# Patient Record
Sex: Male | Born: 1970 | Race: Black or African American | Hispanic: No | Marital: Married | Smoking: Never smoker
Health system: Southern US, Community
[De-identification: ages and names within clinical notes are randomized; demographics above are authoritative.]

## PROBLEM LIST (undated history)

## (undated) DIAGNOSIS — K317 Polyp of stomach and duodenum: Secondary | ICD-10-CM

## (undated) DIAGNOSIS — R002 Palpitations: Secondary | ICD-10-CM

## (undated) DIAGNOSIS — F419 Anxiety disorder, unspecified: Secondary | ICD-10-CM

## (undated) DIAGNOSIS — M1990 Unspecified osteoarthritis, unspecified site: Secondary | ICD-10-CM

## (undated) DIAGNOSIS — K5900 Constipation, unspecified: Secondary | ICD-10-CM

## (undated) DIAGNOSIS — N529 Male erectile dysfunction, unspecified: Secondary | ICD-10-CM

## (undated) DIAGNOSIS — F32A Depression, unspecified: Secondary | ICD-10-CM

## (undated) DIAGNOSIS — I509 Heart failure, unspecified: Secondary | ICD-10-CM

## (undated) DIAGNOSIS — K589 Irritable bowel syndrome without diarrhea: Secondary | ICD-10-CM

## (undated) DIAGNOSIS — G4730 Sleep apnea, unspecified: Secondary | ICD-10-CM

## (undated) DIAGNOSIS — Z86718 Personal history of other venous thrombosis and embolism: Secondary | ICD-10-CM

## (undated) DIAGNOSIS — M549 Dorsalgia, unspecified: Secondary | ICD-10-CM

## (undated) DIAGNOSIS — G4733 Obstructive sleep apnea (adult) (pediatric): Secondary | ICD-10-CM

## (undated) DIAGNOSIS — E739 Lactose intolerance, unspecified: Secondary | ICD-10-CM

## (undated) DIAGNOSIS — T7840XA Allergy, unspecified, initial encounter: Secondary | ICD-10-CM

## (undated) DIAGNOSIS — Z86010 Personal history of colonic polyps: Secondary | ICD-10-CM

## (undated) DIAGNOSIS — K2100 Gastro-esophageal reflux disease with esophagitis, without bleeding: Secondary | ICD-10-CM

## (undated) DIAGNOSIS — I10 Essential (primary) hypertension: Secondary | ICD-10-CM

## (undated) DIAGNOSIS — Z860101 Personal history of adenomatous and serrated colon polyps: Secondary | ICD-10-CM

## (undated) DIAGNOSIS — M2550 Pain in unspecified joint: Secondary | ICD-10-CM

## (undated) HISTORY — DX: Allergy, unspecified, initial encounter: T78.40XA

## (undated) HISTORY — DX: Irritable bowel syndrome, unspecified: K58.9

## (undated) HISTORY — DX: Pain in unspecified joint: M25.50

## (undated) HISTORY — DX: Personal history of other venous thrombosis and embolism: Z86.718

## (undated) HISTORY — DX: Depression, unspecified: F32.A

## (undated) HISTORY — DX: Anxiety disorder, unspecified: F41.9

## (undated) HISTORY — DX: Unspecified osteoarthritis, unspecified site: M19.90

## (undated) HISTORY — DX: Sleep apnea, unspecified: G47.30

## (undated) HISTORY — DX: Personal history of colonic polyps: Z86.010

## (undated) HISTORY — DX: Polyp of stomach and duodenum: K31.7

## (undated) HISTORY — DX: Male erectile dysfunction, unspecified: N52.9

## (undated) HISTORY — DX: Personal history of adenomatous and serrated colon polyps: Z86.0101

## (undated) HISTORY — DX: Lactose intolerance, unspecified: E73.9

## (undated) HISTORY — DX: Gastro-esophageal reflux disease with esophagitis, without bleeding: K21.00

## (undated) HISTORY — DX: Dorsalgia, unspecified: M54.9

## (undated) HISTORY — DX: Constipation, unspecified: K59.00

## (undated) HISTORY — DX: Palpitations: R00.2

## (undated) HISTORY — PX: HAND SURGERY: SHX662

## (undated) HISTORY — DX: Obstructive sleep apnea (adult) (pediatric): G47.33

---

## 2004-09-05 ENCOUNTER — Encounter: Admission: RE | Admit: 2004-09-05 | Discharge: 2004-09-05 | Payer: Self-pay | Admitting: Family Medicine

## 2004-09-05 IMAGING — CR DG HAND COMPLETE 3+V*R*
3 series · 3 of 3 positions shown · non-contrast
Comparison: none

CLINICAL DATA: Fell on hand, with pain and swelling.
RIGHT HAND ? 3 VIEW:
Three views of the right hand were obtained.   There is comminuted fracture of the mid and distal aspect of the right fifth metacarpal with adjacent soft tissue swelling.  Very slight dorsal angulation is present at the fracture site.  No other acute abnormality is seen.  The carpal bones are in normal position.  There may be fusion of the lunate and triquetrum.  Radiocarpal joint space is normal.

[view not recorded (1 of 3)]
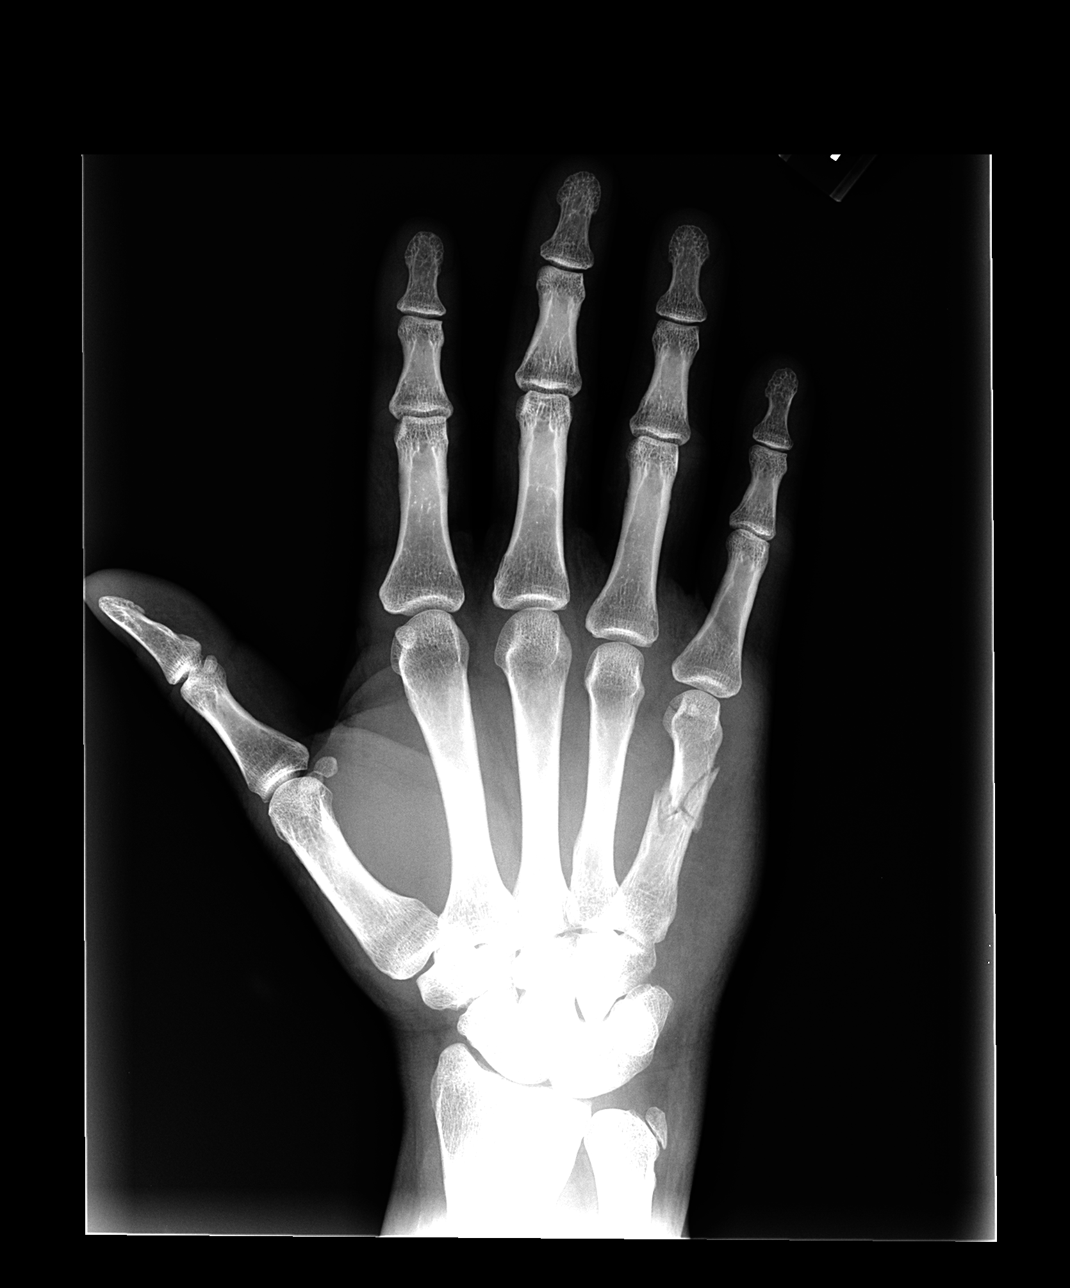

[view not recorded (2 of 3)]
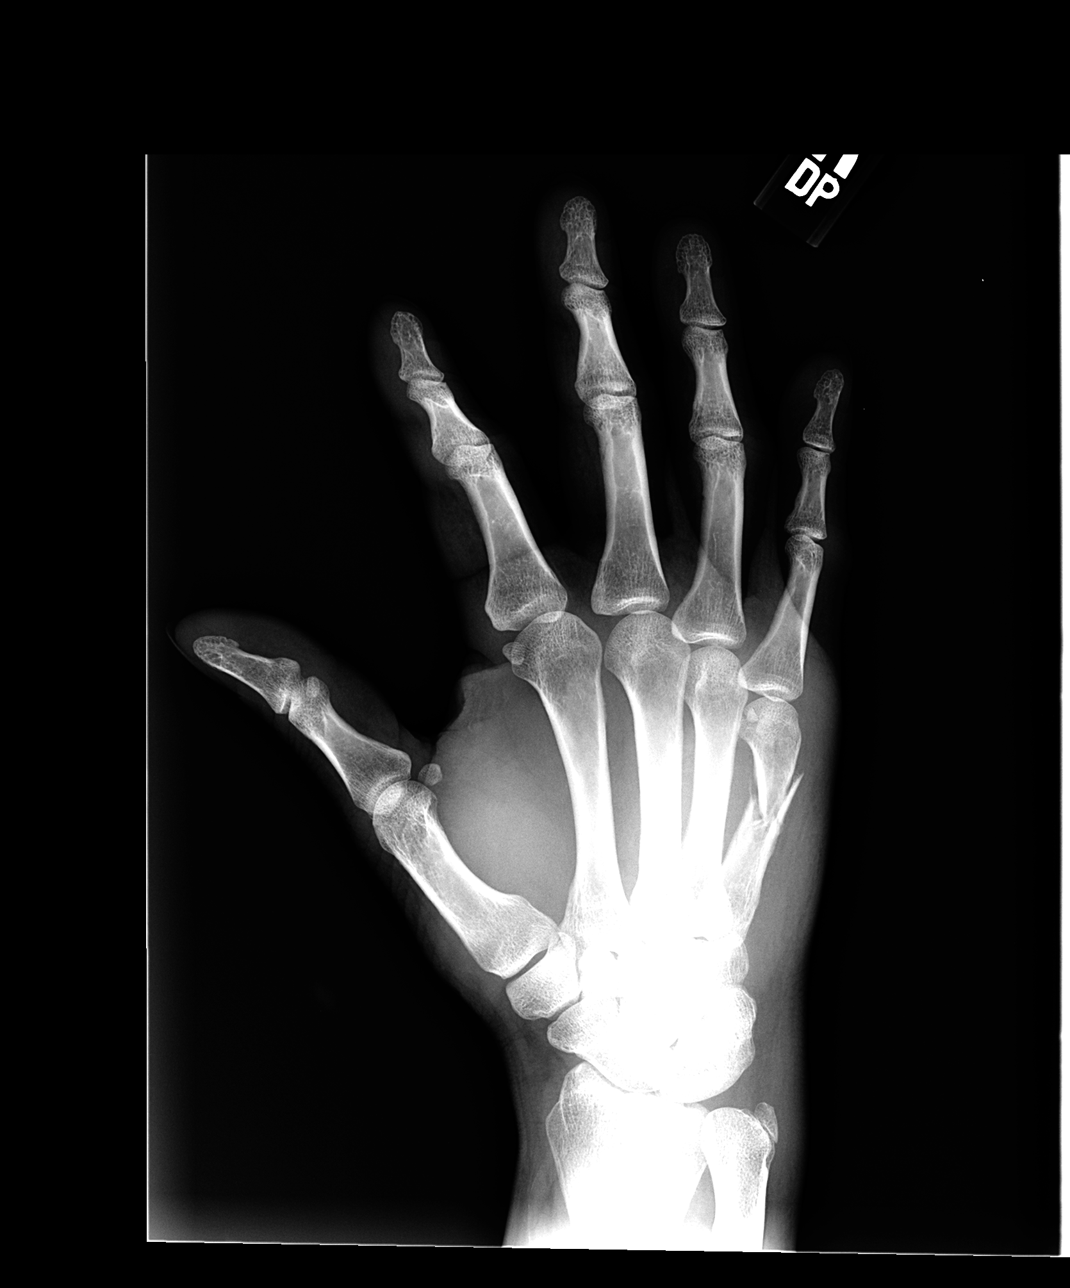

[view not recorded (3 of 3)]
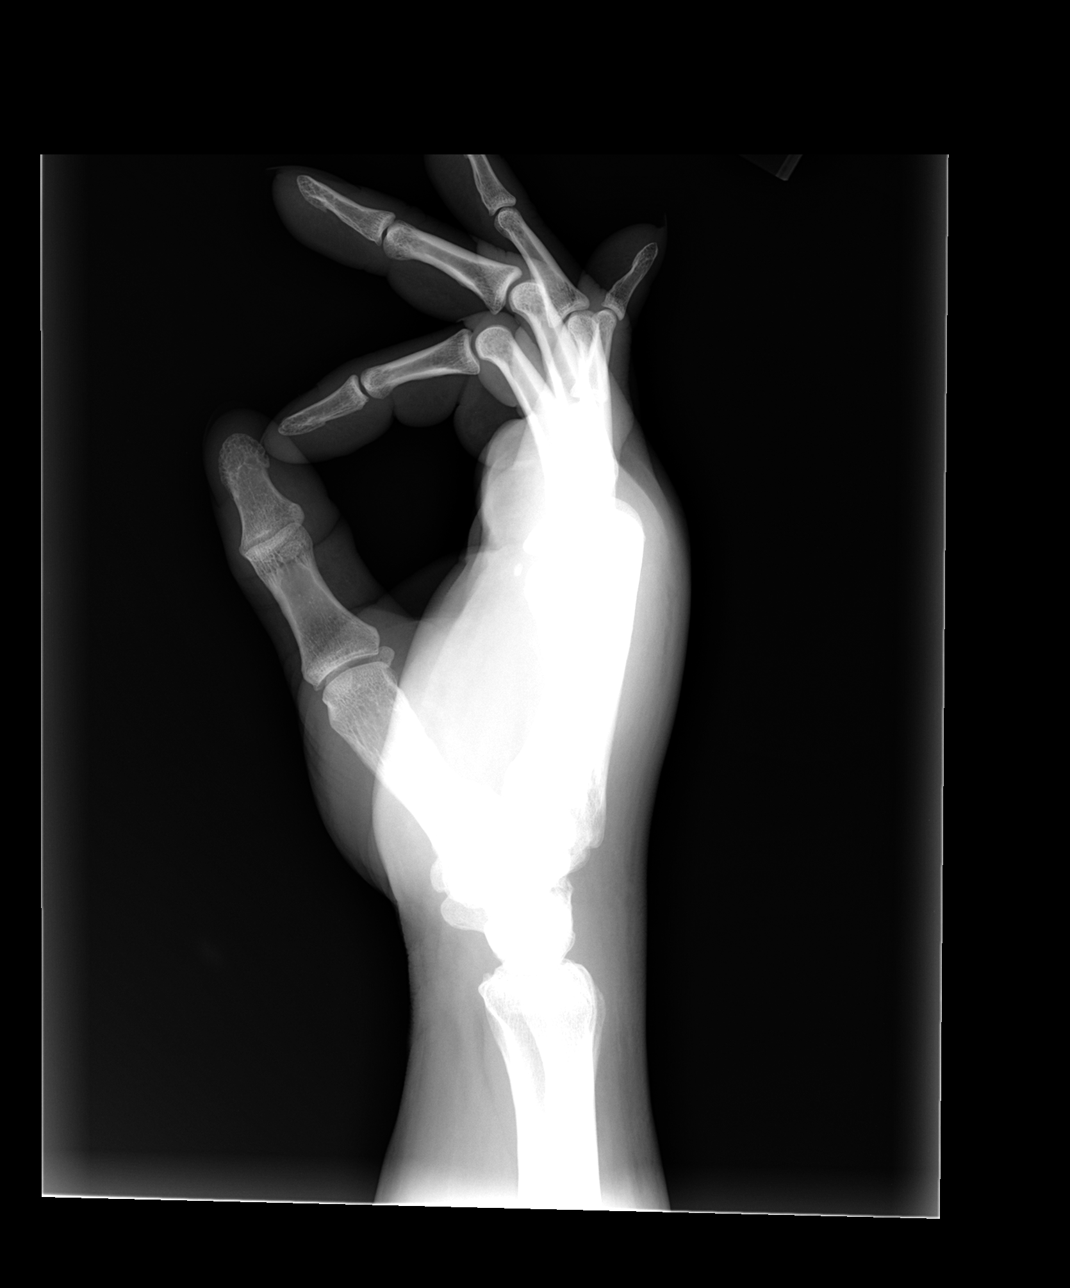

[3 of 3 positions shown; findings below may reference images not displayed]

IMPRESSION: Comminuted and slightly angulated fracture of the mid-distal right fifth metacarpal.

## 2005-08-15 ENCOUNTER — Ambulatory Visit: Payer: Self-pay | Admitting: Family Medicine

## 2005-11-22 ENCOUNTER — Ambulatory Visit: Payer: Self-pay | Admitting: Family Medicine

## 2005-11-22 LAB — CONVERTED CEMR LAB
BUN: 12 mg/dL (ref 6–23)
CO2: 27 meq/L (ref 19–32)
Calcium: 9.6 mg/dL (ref 8.4–10.5)
Chloride: 102 meq/L (ref 96–112)
Chol/HDL Ratio, serum: 4
Cholesterol: 188 mg/dL (ref 0–200)
Creatinine, Ser: 1.2 mg/dL (ref 0.4–1.5)
GFR calc non Af Amer: 72 mL/min
Glomerular Filtration Rate, Af Am: 88 mL/min/{1.73_m2}
Glucose, Bld: 90 mg/dL (ref 70–99)
HDL: 46.6 mg/dL (ref >39.0)
LDL Cholesterol: 128 mg/dL — ABNORMAL HIGH (ref 0–99)
Potassium: 4 meq/L (ref 3.5–5.1)
Sodium: 138 meq/L (ref 135–145)
Triglyceride fasting, serum: 69 mg/dL (ref 0–149)
VLDL: 14 mg/dL (ref 0–40)

## 2006-03-14 DIAGNOSIS — J309 Allergic rhinitis, unspecified: Secondary | ICD-10-CM | POA: Insufficient documentation

## 2006-03-14 DIAGNOSIS — I10 Essential (primary) hypertension: Secondary | ICD-10-CM | POA: Insufficient documentation

## 2006-03-14 DIAGNOSIS — M109 Gout, unspecified: Secondary | ICD-10-CM | POA: Insufficient documentation

## 2006-04-04 ENCOUNTER — Ambulatory Visit: Payer: Self-pay | Admitting: Family Medicine

## 2006-10-05 ENCOUNTER — Ambulatory Visit: Payer: Self-pay | Admitting: Family Medicine

## 2006-10-05 ENCOUNTER — Telehealth (INDEPENDENT_AMBULATORY_CARE_PROVIDER_SITE_OTHER): Payer: Self-pay | Admitting: *Deleted

## 2006-10-11 ENCOUNTER — Encounter (INDEPENDENT_AMBULATORY_CARE_PROVIDER_SITE_OTHER): Payer: Self-pay | Admitting: Family Medicine

## 2006-10-11 LAB — CONVERTED CEMR LAB
BUN: 12 mg/dL (ref 6–23)
CO2: 27 meq/L (ref 19–32)
Calcium: 9.5 mg/dL (ref 8.4–10.5)
Chloride: 103 meq/L (ref 96–112)
Creatinine, Ser: 1.1 mg/dL (ref 0.4–1.5)
GFR calc Af Amer: 97 mL/min
GFR calc non Af Amer: 80 mL/min
Glucose, Bld: 144 mg/dL — ABNORMAL HIGH (ref 70–99)
Potassium: 4 meq/L (ref 3.5–5.1)
Sodium: 138 meq/L (ref 135–145)

## 2007-02-12 ENCOUNTER — Telehealth (INDEPENDENT_AMBULATORY_CARE_PROVIDER_SITE_OTHER): Payer: Self-pay | Admitting: *Deleted

## 2007-07-22 ENCOUNTER — Telehealth (INDEPENDENT_AMBULATORY_CARE_PROVIDER_SITE_OTHER): Payer: Self-pay | Admitting: *Deleted

## 2007-07-29 ENCOUNTER — Telehealth (INDEPENDENT_AMBULATORY_CARE_PROVIDER_SITE_OTHER): Payer: Self-pay | Admitting: *Deleted

## 2007-10-03 ENCOUNTER — Ambulatory Visit: Payer: Self-pay | Admitting: Internal Medicine

## 2007-10-08 LAB — CONVERTED CEMR LAB
ALT: 17 units/L (ref 0–53)
AST: 15 units/L (ref 0–37)
BUN: 10 mg/dL (ref 6–23)
CO2: 31 meq/L (ref 19–32)
Calcium: 9.2 mg/dL (ref 8.4–10.5)
Chloride: 102 meq/L (ref 96–112)
Creatinine, Ser: 1 mg/dL (ref 0.4–1.5)
GFR calc Af Amer: 108 mL/min
GFR calc non Af Amer: 89 mL/min
Glucose, Bld: 90 mg/dL (ref 70–99)
Potassium: 4 meq/L (ref 3.5–5.1)
Sodium: 139 meq/L (ref 135–145)

## 2007-10-31 ENCOUNTER — Ambulatory Visit: Payer: Self-pay | Admitting: Internal Medicine

## 2007-10-31 LAB — CONVERTED CEMR LAB
Inflenza A Ag: NEGATIVE
Influenza B Ag: NEGATIVE

## 2007-12-16 ENCOUNTER — Telehealth (INDEPENDENT_AMBULATORY_CARE_PROVIDER_SITE_OTHER): Payer: Self-pay | Admitting: *Deleted

## 2008-05-13 ENCOUNTER — Ambulatory Visit: Payer: Self-pay | Admitting: Internal Medicine

## 2008-05-13 LAB — CONVERTED CEMR LAB: Rapid Strep: POSITIVE

## 2008-05-18 LAB — CONVERTED CEMR LAB
BUN: 9 mg/dL (ref 6–23)
Basophils Absolute: 0 10*3/uL (ref 0.0–0.1)
Basophils Relative: 0.2 % (ref 0.0–3.0)
CO2: 33 meq/L — ABNORMAL HIGH (ref 19–32)
Calcium: 9.1 mg/dL (ref 8.4–10.5)
Chloride: 102 meq/L (ref 96–112)
Cholesterol: 187 mg/dL (ref 0–200)
Creatinine, Ser: 1.2 mg/dL (ref 0.4–1.5)
Eosinophils Absolute: 0.1 10*3/uL (ref 0.0–0.7)
Eosinophils Relative: 1.5 % (ref 0.0–5.0)
GFR calc non Af Amer: 86.46 mL/min (ref >60)
Glucose, Bld: 87 mg/dL (ref 70–99)
HCT: 52.3 % — ABNORMAL HIGH (ref 39.0–52.0)
HDL: 42.6 mg/dL (ref >39.00)
Hemoglobin: 17.1 g/dL — ABNORMAL HIGH (ref 13.0–17.0)
LDL Cholesterol: 122 mg/dL — ABNORMAL HIGH (ref 0–99)
Lymphocytes Relative: 15.9 % (ref 12.0–46.0)
Lymphs Abs: 1.3 10*3/uL (ref 0.7–4.0)
MCHC: 32.8 g/dL (ref 30.0–36.0)
MCV: 78.7 fL (ref 78.0–100.0)
Monocytes Absolute: 0.6 10*3/uL (ref 0.1–1.0)
Monocytes Relative: 7.5 % (ref 3.0–12.0)
Neutro Abs: 6.4 10*3/uL (ref 1.4–7.7)
Neutrophils Relative %: 74.9 % (ref 43.0–77.0)
Platelets: 216 10*3/uL (ref 150.0–400.0)
Potassium: 4.2 meq/L (ref 3.5–5.1)
RBC: 6.65 M/uL — ABNORMAL HIGH (ref 4.22–5.81)
RDW: 15 % — ABNORMAL HIGH (ref 11.5–14.6)
Sodium: 140 meq/L (ref 135–145)
TSH: 0.92 microintl units/mL (ref 0.35–5.50)
Total CHOL/HDL Ratio: 4
Triglycerides: 114 mg/dL (ref 0.0–149.0)
VLDL: 22.8 mg/dL (ref 0.0–40.0)
WBC: 8.4 10*3/uL (ref 4.5–10.5)

## 2008-10-02 ENCOUNTER — Telehealth (INDEPENDENT_AMBULATORY_CARE_PROVIDER_SITE_OTHER): Payer: Self-pay | Admitting: *Deleted

## 2008-10-16 ENCOUNTER — Ambulatory Visit: Payer: Self-pay | Admitting: Family Medicine

## 2008-11-10 ENCOUNTER — Ambulatory Visit: Payer: Self-pay | Admitting: Internal Medicine

## 2009-04-08 ENCOUNTER — Telehealth (INDEPENDENT_AMBULATORY_CARE_PROVIDER_SITE_OTHER): Payer: Self-pay | Admitting: *Deleted

## 2009-07-12 ENCOUNTER — Ambulatory Visit: Payer: Self-pay | Admitting: Emergency Medicine

## 2009-07-12 DIAGNOSIS — S139XXA Sprain of joints and ligaments of unspecified parts of neck, initial encounter: Secondary | ICD-10-CM | POA: Insufficient documentation

## 2009-08-17 ENCOUNTER — Encounter: Payer: Self-pay | Admitting: Internal Medicine

## 2009-08-18 ENCOUNTER — Ambulatory Visit: Payer: Self-pay | Admitting: Internal Medicine

## 2009-08-18 LAB — CONVERTED CEMR LAB
ALT: 26 units/L (ref 0–53)
AST: 17 units/L (ref 0–37)
Albumin: 4 g/dL (ref 3.5–5.2)
Alkaline Phosphatase: 57 units/L (ref 39–117)
BUN: 17 mg/dL (ref 6–23)
Basophils Absolute: 0 10*3/uL (ref 0.0–0.1)
Basophils Relative: 0.2 % (ref 0.0–3.0)
Bilirubin, Direct: 0.2 mg/dL (ref 0.0–0.3)
CO2: 28 meq/L (ref 19–32)
Calcium: 9.7 mg/dL (ref 8.4–10.5)
Chloride: 104 meq/L (ref 96–112)
Cholesterol: 180 mg/dL (ref 0–200)
Creatinine, Ser: 1.2 mg/dL (ref 0.4–1.5)
Eosinophils Absolute: 0.1 10*3/uL (ref 0.0–0.7)
Eosinophils Relative: 1.8 % (ref 0.0–5.0)
GFR calc non Af Amer: 86.74 mL/min (ref >60)
Glucose, Bld: 103 mg/dL — ABNORMAL HIGH (ref 70–99)
HCT: 50.8 % (ref 39.0–52.0)
HDL: 43.2 mg/dL (ref >39.00)
Hemoglobin: 16.8 g/dL (ref 13.0–17.0)
LDL Cholesterol: 100 mg/dL — ABNORMAL HIGH (ref 0–99)
Lymphocytes Relative: 38.8 % (ref 12.0–46.0)
Lymphs Abs: 1.8 10*3/uL (ref 0.7–4.0)
MCHC: 33 g/dL (ref 30.0–36.0)
MCV: 77.3 fL — ABNORMAL LOW (ref 78.0–100.0)
Monocytes Absolute: 0.5 10*3/uL (ref 0.1–1.0)
Monocytes Relative: 10.2 % (ref 3.0–12.0)
Neutro Abs: 2.3 10*3/uL (ref 1.4–7.7)
Neutrophils Relative %: 49 % (ref 43.0–77.0)
Platelets: 226 10*3/uL (ref 150.0–400.0)
Potassium: 4.6 meq/L (ref 3.5–5.1)
RBC: 6.57 M/uL — ABNORMAL HIGH (ref 4.22–5.81)
RDW: 15.6 % — ABNORMAL HIGH (ref 11.5–14.6)
Sodium: 138 meq/L (ref 135–145)
TSH: 0.81 microintl units/mL (ref 0.35–5.50)
Total Bilirubin: 1.2 mg/dL (ref 0.3–1.2)
Total CHOL/HDL Ratio: 4
Total Protein: 6.9 g/dL (ref 6.0–8.3)
Triglycerides: 182 mg/dL — ABNORMAL HIGH (ref 0.0–149.0)
Uric Acid, Serum: 10.2 mg/dL — ABNORMAL HIGH (ref 4.0–7.8)
VLDL: 36.4 mg/dL (ref 0.0–40.0)
WBC: 4.7 10*3/uL (ref 4.5–10.5)

## 2009-08-24 ENCOUNTER — Telehealth (INDEPENDENT_AMBULATORY_CARE_PROVIDER_SITE_OTHER): Payer: Self-pay | Admitting: *Deleted

## 2009-09-17 ENCOUNTER — Telehealth (INDEPENDENT_AMBULATORY_CARE_PROVIDER_SITE_OTHER): Payer: Self-pay | Admitting: *Deleted

## 2009-10-06 ENCOUNTER — Ambulatory Visit: Payer: Self-pay | Admitting: Internal Medicine

## 2009-10-21 ENCOUNTER — Telehealth: Payer: Self-pay | Admitting: Family Medicine

## 2009-11-10 ENCOUNTER — Ambulatory Visit: Payer: Self-pay | Admitting: Internal Medicine

## 2009-11-12 LAB — CONVERTED CEMR LAB
ALT: 18 units/L (ref 0–53)
AST: 17 units/L (ref 0–37)
BUN: 13 mg/dL (ref 6–23)
Basophils Absolute: 0 10*3/uL (ref 0.0–0.1)
Basophils Relative: 0.3 % (ref 0.0–3.0)
CO2: 32 meq/L (ref 19–32)
Calcium: 9.5 mg/dL (ref 8.4–10.5)
Chloride: 103 meq/L (ref 96–112)
Creatinine, Ser: 1.1 mg/dL (ref 0.4–1.5)
Eosinophils Absolute: 0.2 10*3/uL (ref 0.0–0.7)
Eosinophils Relative: 2.6 % (ref 0.0–5.0)
GFR calc non Af Amer: 91.04 mL/min (ref >60)
Glucose, Bld: 98 mg/dL (ref 70–99)
HCT: 48.8 % (ref 39.0–52.0)
Hemoglobin: 16.2 g/dL (ref 13.0–17.0)
Lymphocytes Relative: 30.1 % (ref 12.0–46.0)
Lymphs Abs: 1.8 10*3/uL (ref 0.7–4.0)
MCHC: 33.3 g/dL (ref 30.0–36.0)
MCV: 77.5 fL — ABNORMAL LOW (ref 78.0–100.0)
Monocytes Absolute: 0.4 10*3/uL (ref 0.1–1.0)
Monocytes Relative: 7 % (ref 3.0–12.0)
Neutro Abs: 3.6 10*3/uL (ref 1.4–7.7)
Neutrophils Relative %: 60 % (ref 43.0–77.0)
Platelets: 234 10*3/uL (ref 150.0–400.0)
Potassium: 4.5 meq/L (ref 3.5–5.1)
RBC: 6.3 M/uL — ABNORMAL HIGH (ref 4.22–5.81)
RDW: 16.9 % — ABNORMAL HIGH (ref 11.5–14.6)
Sodium: 141 meq/L (ref 135–145)
Uric Acid, Serum: 7.2 mg/dL (ref 4.0–7.8)
WBC: 5.9 10*3/uL (ref 4.5–10.5)

## 2010-03-03 NOTE — Progress Notes (Signed)
Summary: altace - dr paz   Phone Note Call from Patient Call back at Work Phone [phone]   Caller: Patient Summary of Call: patient is out of altace he mail rx  mail order but wants a 5 day supply [facility] [address] [facility]  Initial call taken by: Okey Regal Spring,  July 29, 2007 1:36 PM      Prescriptions: ALTACE 10 MG CAPS (RAMIPRIL) Take one tablet daily  #5 x 0   Entered by:   Kandice Hams   Authorized by:   Nolon Rod. Paz MD   Signed by:   Kandice Hams on 07/29/2007   Method used:   [facility] ...       [facility]  [address]       [address] [place], [place]  [address]       Ph: [phone] or [phone]       Fax: [phone]   RxID:   [id number]

## 2010-03-03 NOTE — Progress Notes (Signed)
Summary: gout flare up/Prednison?   Phone Note Call from Patient Call back at [facility] Phone [phone]   Caller: Patient Summary of Call: Pt states that he had a gout flare up, he is taking the Colchine and Allopurinol. He states that the prednisone helps him. He would like to know if he can have 14 pills of Prednisone. He is going to out of town. Please advise. Army Fossa CMA  October 21, 2009 8:38 AM Pharm[facility] [address]  Initial call taken by: Army Fossa CMA,  October 21, 2009 8:38 AM  Follow-up for Phone Call        which joints are affected?  given that he is on allopurinol and colchicine need to make sure it's not an infected joint.  is pt able to come for an appt? Follow-up by: Neena Rhymes MD,  October 21, 2009 8:51 AM  Additional Follow-up for Phone Call Additional follow up Details #1::        I spoke with pt and he states that the flare up is in his ankle, has had gout here before. Per Dr.Tabori since he has had gout in ankle before okay to call in prednisone. If no better pt needs to return to office. pt agrees. Army Fossa CMA  October 21, 2009 8:54 AM     Prescriptions: PREDNISONE 10 MG TABS (PREDNISONE) 2 by mouth once daily x 1 week  #14 x 0   Entered by:   Army Fossa CMA   Authorized by:   Neena Rhymes MD   Signed by:   Army Fossa CMA on 10/21/2009   Method used:   Electronically to        [facility] [phone]* (retail)       [address] [place], [place]  [address]       Ph: [phone] or [phone]       Fax: [phone]   RxID:   [id number]

## 2010-03-03 NOTE — Progress Notes (Signed)
Summary: refill -gout flare up   Phone Note Refill Request Call back at Home Phone [phone] Message from:  Patient on September 17, 2009 11:02 AM  Refills Requested: Medication #1:  PREDNISONE 10 MG TABS 4 by mouth once daily x 2. 3x2 patient has flare up of gout in wrist - [facility] [address] [place]   Initial call taken by: Okey Regal Spring,  September 17, 2009 11:03 AM  Follow-up for Phone Call        okay to call a refill of prednisone, same instructions office visit in 2 weeks, he may need to start allopurinol or uric Follow-up by: Nolon Rod. Paz MD,  September 17, 2009 11:44 AM  Additional Follow-up for Phone Call Additional follow up Details #1::        Left message for pt to call back. Army Fossa CMA  September 17, 2009 11:52 AM     Additional Follow-up for Phone Call Additional follow up Details #2::    Left message for pt to call back. Army Fossa CMA  September 20, 2009 3:50 PM  Left message for pt to call back. Army Fossa CMA  September 21, 2009 10:52 AM   Additional Follow-up for Phone Call Additional follow up Details #3:: Details for Additional Follow-up Action Taken: Patient is aware and he does not want to take the allopurinol. The prednisone has worked well for him this time.  Additional Follow-up by: Harold Barban,  September 21, 2009 11:08 AM  Prescriptions: PREDNISONE 10 MG TABS (PREDNISONE) 4 by mouth once daily x 2. 3x2,2x2,1x2  #20 x 0   Entered by:   Army Fossa CMA   Authorized by:   Nolon Rod. Paz MD   Signed by:   Army Fossa CMA on 09/17/2009   Method used:   Electronically to        [facility] [phone][facility] (retail)       [address] [place], [place]  [address]       Ph: [phone] or [phone]       Fax: [phone]   RxID:   [id number]

## 2010-03-03 NOTE — Assessment & Plan Note (Signed)
Summary: FOLLOW UP ON MEDS/CDJ   Vital Signs:  Patient Profile:   40 Years Old Male Weight:      302 pounds Pulse rate:   82 / minute BP sitting:   152 / 100  (left arm)  Vitals Entered By: Doristine Devoid (October 03, 2007 10:25 AM)                 Chief Complaint:  FOLLOW UP ON MEDS.  History of Present Illness: ROV     Current Allergies: ! SUDAFED  Past Medical History:    Reviewed history from 03/14/2006 and no changes required:       Allergic rhinitis       Gout       Hypertension  Past Surgical History:    Reviewed history and no changes required:       no   Family History:    colon ca--no    prostate ca--no    MI--aunt age 10 (?)    DM-- "runs in the family"    cervical Ca--mom, passed away at age 70  Social History:    Married    Occupation: works for [employer], sedentary    children , 1 boy 3y/o   Risk Factors:  Tobacco use:  never Alcohol use:  yes    Comments:  rarely   Review of Systems  CV      Denies chest pain or discomfort and swelling of feet.      ambulatory BPs 120/80   Resp      Denies shortness of breath.  MS      no recent gout attack (last few months ago after seafood)   Physical Exam  General:     alert and well-developed.   Lungs:     normal respiratory effort, no intercostal retractions, no accessory muscle use, and normal breath sounds.   Heart:     normal rate, regular rhythm, and no murmur.   Extremities:     no pretibial edema bilaterally     Impression & Recommendations:  Problem # 1:  HYPERTENSION (ICD-401.9) ambulatory BPs ok, states he gets anxious at the doctor's office see instructions  d/w pt diet and exercise His updated medication list for this problem includes:    Altace 10 Mg Caps (Ramipril) .Marland Kitchen... Take one tablet daily  Orders: Venipuncture (56213) TLB-BMP (Basic Metabolic Panel-BMET) (80048-METABOL)  BP today: 152/100 Prior BP: 118/90 (10/05/2006)  Labs Reviewed: Creat: 1.1  (10/05/2006) Chol: 188 (11/22/2005)   HDL: 46.6 (11/22/2005)   LDL: 128 (11/22/2005)   TG: 69 (11/22/2005)   Problem # 2:  GOUT (ICD-274.9) stable His updated medication list for this problem includes:    Colchicine 0.6 Mg Tabs (Colchicine) .Marland Kitchen... 1 tablet at onset of pain, then 1 by mouth q2hr until relieve or gi symptoms: max 4 tablet/24hr, then 1 by mouth two times a day for 10 days  Orders: TLB-ALT (SGPT) (84460-ALT) TLB-AST (SGOT) (84450-SGOT)   Problem # 3:  before he left.. having difficulty w/ erections lately counseled  Complete Medication List: 1)  Altace 10 Mg Caps (Ramipril) .... Take one tablet daily 2)  Fluticasone Propionate 50 Mcg/act Susp (Fluticasone propionate) 3)  Colchicine 0.6 Mg Tabs (Colchicine) .Marland Kitchen.. 1 tablet at onset of pain, then 1 by mouth q2hr until relieve or gi symptoms: max 4 tablet/24hr, then 1 by mouth two times a day for 10 days   Patient Instructions: 1)  Please schedule a  PHYSICAL  in 4 months--FASTING  2)  Check your BP once a week, call if it is consistently > 140/85   ]

## 2010-03-03 NOTE — Progress Notes (Signed)
Summary: rx  Phone Note Refill Request Call back at Home Phone [phone] Message from:  Patient  Refills Requested: Medication #1:  ALTACE 10 MG CAPS Take one tablet daily need 90 day supply mailorder,  [facility]  [address] [place]   Follow-up for Phone Call        left msg 30 [facility], also due in april for 6 mos followup .  Call and let us know what mailorder you use Kandice Hams  April 08, 2009 4:57 PM  Follow-up by: Kandice Hams,  April 08, 2009 4:58 PM     Appended Document: rx pt called mailorder for 90 days is [facility]

## 2010-03-03 NOTE — Assessment & Plan Note (Signed)
Summary: rto 4 months/cbs   Vital Signs:  Patient profile:   40 year old male Weight:      301.25 pounds Pulse rate:   99 / minute Pulse rhythm:   regular BP sitting:   132 / 86  (left arm) Cuff size:   large  Vitals Entered By: Army Fossa CMA (November 10, 2009 9:51 AM) CC: Pt here to f/u on meds Comments declines flu shot [facility]   History of Present Illness: routine office visit for gout, doing great  Current Medications (verified): 1)  Altace 10 Mg Caps (Ramipril) .... Take One Tablet Daily 2)  Indomethacin Cr 75 Mg Cr-Caps (Indomethacin) .... Two Times A Day 3)  Allopurinol 100 Mg Tabs (Allopurinol) .... 2 By Mouth Daily 4)  Colcrys 0.6 Mg Tabs (Colchicine) .... One Mouth Every 4 Hours As Needed  Allergies (verified): 1)  ! Sudafed 2)  ! * Ephedrine  Past History:  Past Medical History: Reviewed history from 03/14/2006 and no changes required. Allergic rhinitis Gout Hypertension  Past Surgical History: Reviewed history from 10/06/2009 and no changes required. remote right wrist fracture  Social History: Reviewed history from 08/18/2009 and no changes required. Married Occupation: works for [employer] 1 child  Never Smoked Alcohol use-- rarely  Drug use-no diet-- still poor exercise-- active, works out often  Review of Systems       patient was started on allopurinol, good compliance. denies nausea, vomiting, diarrhea or a rash Immediately after the initiation of allopurinol  he had some pain in the wrist but it quickly went away with Indocin Later on he called with a flareup in his ankle, he took prednisone and symptoms again quickly resolved He is following a gout diet except yesterday when he had some shrimp  Physical Exam  General:  alert and well-developed.   Extremities:  no lower extremity edema Wrists and ankles without puffiness, redness, warmthness   Impression & Recommendations:  Problem # 1:  GOUT (ICD-274.9) doing  great Continue with allopurinol Recheck labs encourage compliance with diet Patient aware that depending on results we may have to increase  his allopurinol dose. If that is the case he will need indometacin or colchicine temporarily. His updated medication list for this problem includes:    Indomethacin Cr 75 Mg Cr-caps (Indomethacin) .Marland Kitchen..Marland Kitchen Two times a day    Allopurinol 100 Mg Tabs (Allopurinol) .Marland Kitchen... 2 by mouth daily    Colcrys 0.6 Mg Tabs (Colchicine) ..... One mouth every 4 hours as needed  Orders: Venipuncture ([address]) TLB-BMP (Basic Metabolic Panel-BMET) (80048-METABOL) TLB-ALT (SGPT) (84460-ALT) TLB-AST (SGOT) (84450-SGOT) TLB-CBC Platelet - w/Differential (85025-CBCD) TLB-Uric Acid, Blood (84550-URIC) Specimen Handling ([address])  Problem # 2:  HYPERTENSION (ICD-401.9) at goal  His updated medication list for this problem includes:    Altace 10 Mg Caps (Ramipril) .Marland Kitchen... Take one tablet daily  BP today: 132/86 Prior BP: 132/84 (10/06/2009)  Labs Reviewed: K+: 4.6 (08/18/2009) Creat: : 1.2 (08/18/2009)   Chol: 180 (08/18/2009)   HDL: 43.20 (08/18/2009)   LDL: 100 (08/18/2009)   TG: 182.0 (08/18/2009)  Complete Medication List: 1)  Altace 10 Mg Caps (Ramipril) .... Take one tablet daily 2)  Indomethacin Cr 75 Mg Cr-caps (Indomethacin) .... Two times a day 3)  Allopurinol 100 Mg Tabs (Allopurinol) .... 2 by mouth daily 4)  Colcrys 0.6 Mg Tabs (Colchicine) .... One mouth every 4 hours as needed  Patient Instructions: 1)  Please schedule a follow-up appointment in 4 months .  Prescriptions: ALLOPURINOL 100  MG TABS (ALLOPURINOL) 2 by mouth daily  #180 x 2   Entered and Authorized by:   Nolon Rod. Paz MD   Signed by:   Nolon Rod. Paz MD on 11/10/2009   Method used:   Electronically to        [facility] [phone]* (retail)       [address] [place], [place]  [address]       Ph: [phone] or [phone]       Fax: [phone]   RxID:   [id number]

## 2010-03-03 NOTE — Assessment & Plan Note (Signed)
Summary: rto 6 months,cbs   Vital Signs:  Patient profile:   40 year old male Height:      72.5 inches Weight:      306.6 pounds Pulse rate:   86 / minute Pulse rhythm:   regular BP sitting:   120 / 98 Cuff size:   large  Vitals Entered By: Shary Decamp (November 10, 2008 12:49 PM) CC: rov   History of Present Illness: feels well. Ambulatory blood pressures between 120 to 130/80  Current Medications (verified): 1)  Altace 10 Mg Caps (Ramipril) .... Take One Tablet Daily 2)  Singulair 10 Mg Tabs (Montelukast Sodium) .Marland Kitchen.. 1 By Mouth At Bedtime  Allergies (verified): 1)  ! Sudafed 2)  ! * Ephedrine  Past History:  Past Medical History: Reviewed history from 03/14/2006 and no changes required. Allergic rhinitis Gout Hypertension  Past Surgical History: Reviewed history from 10/16/2008 and no changes required. Denies surgical history  Social History: Reviewed history from 10/16/2008 and no changes required. Married Occupation: works for [employer] Never Smoked Alcohol use-yes Drug use-no  Review of Systems ENT:  had singulair Rx at the [facility], helping w/allergies . Resp:  Denies cough and shortness of breath. GI:  Denies diarrhea, nausea, and vomiting.  Physical Exam  General:  alert, well-developed, and overweight-appearing.   Extremities:  no pretibial edema bilaterally  Psych:  not anxious appearing and not depressed appearing.     Impression & Recommendations:  Problem # 1:  HYPERTENSION (ICD-401.9) good ambulatory blood pressures, no change His updated medication list for this problem includes:    Altace 10 Mg Caps (Ramipril) .Marland Kitchen... Take one tablet daily  BP today: 120/98 Prior BP: 168/105 (10/16/2008)  Labs Reviewed: K+: 4.2 (05/13/2008) Creat: : 1.2 (05/13/2008)   Chol: 187 (05/13/2008)   HDL: 42.60 (05/13/2008)   LDL: 122 (05/13/2008)   TG: 114.0 (05/13/2008)  Problem # 2:  HEALTH SCREENING (ICD-V70.0) patient is very interested in wt loss,  diet  and exercise; also wonders about that diet pill I don't think he needs a  diet pill, explained to the patient the down side of those pills he was counseled about diet, exercise, portion control, et cetera. Information material was provided. the majority of this 15 minute visit was spent counseling  Complete Medication List: 1)  Altace 10 Mg Caps (Ramipril) .... Take one tablet daily 2)  Singulair 10 Mg Tabs (Montelukast sodium) .Marland Kitchen.. 1 by mouth at bedtime  Patient Instructions: 1)  Please schedule a follow-up appointment in 6 months .    Not Administered:    Influenza Vaccine not given due to: pt will get @ work

## 2010-03-03 NOTE — Progress Notes (Signed)
 ----   Converted from flag ---- ---- 10/05/2006 1:08 PM, Okey Regal Spring wrote: pt was here today forgot to get rx for prednisone 20mg  - [facility] [address] ------------------------------  Phone Note Outgoing Call   Call placed by: Ardyth Man,  October 05, 2006 1:11 PM Call placed to: Patient Summary of Call: Per Dr. Blossom Hoops Pt. will need to get from Orthopedics ...................................................................Ardyth Man  October 05, 2006 1:11 PM Pt. aware ...................................................................Ardyth Man  October 05, 2006 1:12 PM

## 2010-03-03 NOTE — Progress Notes (Signed)
Summary: COULD NOT FIND 30 DAY PRESC FOR ALTACE  Phone Note Call from Patient Call back at [facility] Phone [phone]   Caller: Patient Summary of Call: DR Blossom Hoops PATIENT  PATIENT CAME BY TO PICK UP HIS TWO PRESCRIPTIONS FOR ALTACE--COULD ONLY FIND THE 90 DAY PRESCRIPTION--WHERE IS THE 30 DAY PRRESCRIPTION (NO RECORD OF HIS PHARMACY, SO IT COULD NOT HAVE BEEN CALLED IN)--PLEASE CALL HIM WITH THE 30 DAY PRESCRIPTIION AT [phone] Initial call taken by: Jerolyn Shin,  February 12, 2007 4:51 PM  Follow-up for Phone Call        See phone note for 02/12/07, only a 90 day supply was requested.  Thanks, Michelle Pt. aware to pick up 30 days supply rx up front at his convenience by way of message ...................................................................Ardyth Man  February 13, 2007 8:15 AM  Follow-up by: Ardyth Man,  February 13, 2007 8:15 AM      Prescriptions: ALTACE 10 MG CAPS (RAMIPRIL) Take one tablet daily  #30 x 0   Entered by:   Ardyth Man   Authorized by:   Leanne Chang MD   Signed by:   Ardyth Man on 02/13/2007   Method used:   Print then Give to Patient   RxID:   [id number]

## 2010-03-03 NOTE — Progress Notes (Signed)
Summary: PT NEEDS A WRITTEN RX FOR ALTACE  Phone Note Call from Patient Call back at [facility] Phone [phone]   Caller: Patient Reason for Call: Refill Medication Summary of Call: DR Blossom Hoops.  PT NEEDS A WRITTEN RX FOR ALTACE. PT NEEDS A 90 DAY SUPPLY. PLEASE CALL PT WHEN RX IS READY FOR PICK UP Initial call taken by: Job Founds,  February 12, 2007 11:43 AM  Follow-up for Phone Call        Rx placed up front for patient pick up and patient aware ...................................................................Ardyth Man  February 12, 2007 12:01 PM By way of message ...................................................................Ardyth Man  February 12, 2007 12:02 PM  Follow-up by: Ardyth Man,  February 12, 2007 12:01 PM    New/Updated Medications: ALTACE 10 MG CAPS (RAMIPRIL) Take one tablet daily   Prescriptions: ALTACE 10 MG CAPS (RAMIPRIL) Take one tablet daily  #90 x 0   Entered by:   Ardyth Man   Authorized by:   Leanne Chang MD   Signed by:   Ardyth Man on 02/12/2007   Method used:   Print then Give to Patient   RxID:   [id number] ALTACE 10 MG CAPS (RAMIPRIL) Take one tablet daily  #30 x 1   Entered by:   Ardyth Man   Authorized by:   Leanne Chang MD   Signed by:   Ardyth Man on 02/12/2007   Method used:   Print then Give to Patient   RxID:   [id number]

## 2010-03-03 NOTE — Assessment & Plan Note (Signed)
Summary: roa bp check/cbs  Medications Added ALTACE 10 MG CAPS (RAMIPRIL)  FLUTICASONE PROPIONATE 50 MCG/ACT SUSP (FLUTICASONE PROPIONATE)  COLCHICINE 0.6 MG  TABS (COLCHICINE) 1 tablet at onset of pain, then 1 by mouth Q2hr until relieve or GI symptoms: max 4 tablet/24hr, then 1 by mouth two times a day for 10 days      Allergies Added: ! SUDAFED  Vital Signs:  Patient Profile:   40 Years Old Male Weight:      286.38 pounds Temp:     98.5 degrees F oral Pulse rate:   80 / minute Resp:     16 per minute BP sitting:   118 / 90  (right arm)  Pt. in pain?   no  Vitals Entered By: Ardyth Man (October 05, 2006 11:22 AM)                  Chief Complaint:  BP Check.  History of Present Illness:  Hypertension Follow-Up      This is a 40 year old man who presents for Hypertension follow-up.  The patient denies lightheadedness, edema, and fatigue.  The patient denies the following associated symptoms: chest pain, exercise intolerance, and dyspnea.  Compliance with medications (by patient report) has been near 100%.  The patient reports that dietary compliance has been good.  The patient reports exercising occasionally.  Reports he has changed his diet significantly. Reports blood pressure typically running 115/70s. Was rushing here today and was a little stress so he feels that is why blood pressure a little elevated  Patient was wondering if I can give him an Rx for colchicine unti he sees his rheumatologist, just in case he has a gout flare up  Current Allergies: ! SUDAFED      Physical Exam  General:     overweight-appearing.   Neck:     No deformities, masses, or tenderness noted. Lungs:     Normal respiratory effort, chest expands symmetrically. Lungs are clear to auscultation, no crackles or wheezes. Heart:     Normal rate and regular rhythm. S1 and S2 normal without gallop, murmur, click, rub or other extra sounds. Msk:     No deformity or scoliosis noted  of thoracic or lumbar spine.   Extremities:     No clubbing, cyanosis, edema, or deformity noted with normal full range of motion of all joints.      Impression & Recommendations:  Problem # 1:  HYPERTENSION (ICD-401.9) Borderline today Patient will continue to monitor blood pressure regularly, If above 140/90, he will f/u sooner then next scheduled appt. His updated medication list for this problem includes:    Altace 10 Mg Caps (Ramipril)  Orders: TLB-BMP (Basic Metabolic Panel-BMET) (80048-METABOL)  BP today: 118/90  Labs Reviewed: Creat: 1.2 (11/22/2005) Chol: 188 (11/22/2005)   HDL: 46.6 (11/22/2005)   LDL: 128 (11/22/2005)   TG: 69 (11/22/2005)   Problem # 2:  GOUT (ICD-274.9)  His updated medication list for this problem includes:    Colchicine 0.6 Mg Tabs (Colchicine) .Marland Kitchen... 1 tablet at onset of pain, then 1 by mouth q2hr until relieve or gi symptoms: max 4 tablet/24hr, then 1 by mouth two times a day for 10 days   Complete Medication List: 1)  Altace 10 Mg Caps (Ramipril) 2)  Fluticasone Propionate 50 Mcg/act Susp (Fluticasone propionate) 3)  Colchicine 0.6 Mg Tabs (Colchicine) .Marland Kitchen.. 1 tablet at onset of pain, then 1 by mouth q2hr until relieve or gi symptoms: max 4  tablet/24hr, then 1 by mouth two times a day for 10 days     Prescriptions: COLCHICINE 0.6 MG  TABS (COLCHICINE) 1 tablet at onset of pain, then 1 by mouth Q2hr until relieve or GI symptoms: max 4 tablet/24hr, then 1 by mouth two times a day for 10 days  #24 x 0   Entered by:   Ardyth Man   Authorized by:   Leanne Chang MD   Signed by:   Leanne Chang MD on 10/05/2006   Method used:   Telephoned to ...       [facility]  [address] [facility]       [address] [place], [place]  [address]  [place]       Ph: [phone] or [phone]       Fax: [phone]   RxID:   [id number]

## 2010-03-03 NOTE — Assessment & Plan Note (Signed)
Summary: COUGH,ALLERGIES//TH (3)   Vital Signs:  Patient Profile:   40 Years Old Male CC:      cough and allergy symptoms Height:     72.5 inches O2 treatment:    Room Air Temp:     97.6 degrees F oral Pulse rate:   85 / minute Pulse rhythm:   regular Resp:     16 per minute BP sitting:   168 / 105  (right arm)  Vitals Entered By: Lannie Fields (October 16, 2008 6:34 PM)                  Updated Prior Medication List: ALTACE 10 MG CAPS (RAMIPRIL) Take one tablet daily  Current Allergies: ! SUDAFED ! * EPHEDRINE History of Present Illness Chief Complaint: cough and allergy symptoms History of Present Illness: Subjective:  Patient complains of a history of seasonal allergies.  For about 3 weeks he has had persistent mild congestion in his sinuses and chest.   Recently he has started wheezing at night, although he denies shortness of breath or wheezing daytime or during activity.  For the past two weeks his sputum has been yellow.  He denies fever.  No pleuritic pain.  No GI or GU symptoms.  He has a past history of mild asthma as a child.  He recently tried an albuterol nebulizer treatment at night that resolved his symptoms temporarily.  REVIEW OF SYSTEMS Constitutional Symptoms      Denies fever, chills, night sweats, weight loss, weight gain, and fatigue.  Eyes       Denies change in vision, eye pain, eye discharge, glasses, contact lenses, and eye surgery. Ear/Nose/Throat/Mouth       Complains of sinus problems.      Denies hearing loss/aids, change in hearing, ear pain, ear discharge, dizziness, frequent runny nose, frequent nose bleeds, sore throat, hoarseness, and tooth pain or bleeding.  Respiratory       Complains of dry cough, wheezing, and bronchitis.      Denies productive cough, shortness of breath, asthma, and emphysema/COPD.  Cardiovascular       Denies murmurs, chest pain, and tires easily with exhertion.    Gastrointestinal       Denies stomach pain,  nausea/vomiting, diarrhea, constipation, blood in bowel movements, and indigestion. Genitourniary       Denies painful urination, kidney stones, and loss of urinary control. Neurological       Denies paralysis, seizures, and fainting/blackouts. Musculoskeletal       Denies muscle pain, joint pain, joint stiffness, decreased range of motion, redness, swelling, muscle weakness, and gout.  Skin       Denies bruising, unusual mles/lumps or sores, and hair/skin or nail changes.  Psych       Denies mood changes, temper/anger issues, anxiety/stress, speech problems, depression, and sleep problems.  Past History:  Past Medical History: Reviewed history from 03/14/2006 and no changes required. Allergic rhinitis Gout Hypertension  Past Surgical History: Denies surgical history  Family History: Reviewed history from 05/13/2008 and no changes required. colon ca--no prostate ca--no MI--aunt age 34 (?) DM-- "runs in the family" cervical Ca--mom, passed away at age 24 CHF - F HTN - F  Social History: Married Occupation: works for [employer] Never Smoked Alcohol use-yes Drug use-no    Objective:  Appearance:  Patient appears healthy, stated age, and in no acute distress  Eyes:  Pupils are equal, round, and reactive to light and accomdation.  Extraocular movement is intact.  Conjunctivae are not inflamed.  Ears:  Canals normal.  Tympanic membranes normal.   Nose:  Normal septum.  Normal turbinates, mildly congested.   No sinus tenderness present.  Pharynx:  Normal  Neck:  Supple.  No adenopathy is present.  No thyromegaly is present  Lungs:  Mild bibasilar frontal wheezes.  Breath sounds are equal.  Heart:  Regular rate and rhythm without murmurs, rubs, or gallops.  Abdomen:  Nontender without masses or hepatosplenomegaly.  Bowel sounds are present.  No CVA or flank tenderness.  Extremities:  No edema.  Assessment  Assessed ALLERGIC RHINITIS as deteriorated - Donna Christen MD New  Problems: BRONCHITIS, ACUTE (ICD-466.0)  EXACERBATION OF SEASONAL RHINITIS WITH ASSOCIATED BRONCHOSPASM  Plan New Medications/Changes: PROAIR HFA 108 (90 BASE) MCG/ACT AERS (ALBUTEROL SULFATE) Two inhalations q4-6hr as needed.  Max 12 puffs/day  #1 MDI x 0, 10/16/2008, Donna Christen MD SINGULAIR 10 MG TABS (MONTELUKAST SODIUM) 1 by mouth at bedtime  #30 x 1, 10/16/2008, Donna Christen MD AZITHROMYCIN 250 MG TABS (AZITHROMYCIN) Two tabs by mouth on day 1, then 1 tab daily on days 2 through 5  #6 tabs x 0, 10/16/2008, Donna Christen MD  New Orders: New Patient Level III [phone] Planning Comments:   Begin a trial of Singulair 10 daily.  Add expectorant.  Begin a trial of albuterol MDI at bedtime (may use q4 to 6hr if necessary) Begin a Z-pack Follow-up with PCP if not improving 10 to 14 days.   The patient and/or caregiver has been counseled thoroughly with regard to medications prescribed including dosage, schedule, interactions, rationale for use, and possible side effects and they verbalize understanding.  Diagnoses and expected course of recovery discussed and will return if not improved as expected or if the condition worsens. Patient and/or caregiver verbalized understanding.  Prescriptions: PROAIR HFA 108 (90 BASE) MCG/ACT AERS (ALBUTEROL SULFATE) Two inhalations q4-6hr as needed.  Max 12 puffs/day  #1 MDI x 0   Entered and Authorized by:   Donna Christen MD   Signed by:   Donna Christen MD on 10/16/2008   Method used:   Print then Give to Patient   RxID:   [id number] SINGULAIR 10 MG TABS (MONTELUKAST SODIUM) 1 by mouth at bedtime  #30 x 1   Entered and Authorized by:   Donna Christen MD   Signed by:   Donna Christen MD on 10/16/2008   Method used:   Print then Give to Patient   RxID:   [id number] AZITHROMYCIN 250 MG TABS (AZITHROMYCIN) Two tabs by mouth on day 1, then 1 tab daily on days 2 through 5  #6 tabs x 0   Entered and Authorized by:   Donna Christen MD   Signed by:    Donna Christen MD on 10/16/2008   Method used:   Print then Give to Patient   RxID:   [id number]   Patient Instructions: 1)  May use Mucinex  (guaifenesin) twice daily for congestion. 2)  Increase fluid intake 3)   Also recommend using saline nasal spray several times daily and/or saline nasal irrigation. 4)  Followup with family doctor if not improving 10 to 14 days.

## 2010-03-03 NOTE — Progress Notes (Signed)
Summary: RX  Phone Note Refill Request Message from:  Patient  Refills Requested: Medication #1:  ALTACE 10 MG CAPS Take one tablet daily PT CALLED NEED 90 DAY SUPPLY TO [facility] HAS 1 PILL LEFT WANTS TO SEND A RX TO LOCAL PHARMACY [facility] [place]  Initial call taken by: Kandice Hams,  October 02, 2008 10:18 AM    Prescriptions: ALTACE 10 MG CAPS (RAMIPRIL) Take one tablet daily  #90 x 0   Entered by:   Kandice Hams   Authorized by:   Nolon Rod. Paz MD   Signed by:   Kandice Hams on 10/02/2008   Method used:   Faxed to ...       [facility]* (mail-order)             ,          Ph: [phone]       Fax: [phone]   RxID:   [id number] ALTACE 10 MG CAPS (RAMIPRIL) Take one tablet daily  #30 x 0   Entered by:   Kandice Hams   Authorized by:   Nolon Rod. Paz MD   Signed by:   Kandice Hams on 10/02/2008   Method used:   [facility]ed to ...       [facility]  [place] [phone]       [address] [place], [place]  [address]       Ph: [phone] or [phone]       Fax: [phone]   RxID:   [id number]

## 2010-03-03 NOTE — Letter (Signed)
Summary: Results Follow-up Letter  [facility]  [address] [place], [place] [address]   Phone: [phone]  Fax: [phone]    10/11/2006         [place] [place] [address] [place], [place]  [address]  Dear Mr. KEARSE,   The following are the results of your recent test(s):  Test     Result     Pap Smear    Normal_______  Not Normal_____       Comments: _________________________________________________________ Cholesterol LDL(Bad cholesterol):          Your goal is less than:         HDL (Good cholesterol):        Your goal is more than: _________________________________________________________ Other Tests:   _________________________________________________________  Please call for an appointment Or __LABS OVERALL OKAY._______________________________________________________ _________________________________________________________ _________________________________________________________  Sincerely,  Ardyth Man [place] [facility]

## 2010-03-03 NOTE — Assessment & Plan Note (Signed)
Summary: cpx//vgj   Vital Signs:  Patient profile:   40 year old male Height:      72.5 inches Weight:      303.2 pounds BMI:     40.70 Temp:     98.8 degrees F oral Pulse rate:   84 / minute  Vitals Entered By: Shary Decamp (May 13, 2008 12:51 PM) Comments cpx - fasting  - c/o of ST -- wife with strep Shary Decamp  May 13, 2008 1:01 PM    Serial Vital Signs/Assessments:  Time      Position  BP       Pulse  Resp  Temp     By 1:01 PM   R Arm     160/108                        Stacia Hawks 1:01 PM   L Arm     160/110                        Stacia Hawks 1:44 PM   L Arm     140/100                        Stacia Hawks   History of Present Illness: CPX ST x 1 day ----> (-) fever, some sinus congestion, (-) cough or chest congestion  Preventive Screening-Counseling & Management     Alcohol drinks/day: <1     Smoking Status: never     Passive Smoke Exposure: no     Caffeine use/day: 0     Diet Comments: 'not at all healthy'     Does Patient Exercise: yes     Times/week: 5      Drug Use:  no.    Current Medications (verified): 1)  Altace 10 Mg Caps (Ramipril) .... Take One Tablet Daily 2)  Fluticasone Propionate 50 Mcg/act Susp (Fluticasone Propionate) .... As Needed 3)  Amoxicillin 500 Mg Tabs (Amoxicillin) .... 2 By Mouth Two Times A Day  Allergies (verified): 1)  ! Sudafed  Past History:  Past Medical History:    Reviewed history from 03/14/2006 and no changes required:    Allergic rhinitis    Gout    Hypertension  Past Surgical History:    Reviewed history from 10/03/2007 and no changes required:    no  Family History:    Reviewed history from 10/03/2007 and no changes required:       colon ca--no       prostate ca--no       MI--aunt age 103 (?)       DM-- "runs in the family"       cervical Ca--mom, passed away at age 58       CHF - F       HTN - F  Social History:    Reviewed history from 10/31/2007 and no changes required:       Married   Occupation: works for [employer], sedentary (desk job)       children , 1 boy 3y/o    Does Patient Exercise:  yes    Passive Smoke Exposure:  no    Drug Use:  no    Caffeine use/day:  0  Review of Systems General:  Denies fatigue and weight loss. CV:  Denies chest pain or discomfort, palpitations, and swelling of feet; ambulatory BPs . Resp:  Denies shortness of  breath. GI:  Denies abdominal pain, bloody stools, and diarrhea. GU:  Denies dysuria, hematuria, urinary frequency, and urinary hesitancy. Psych:  Denies anxiety and depression; (+) stress at work but does ok.  Physical Exam  General:  alert, well-developed, and overweight-appearing.   Ears:  TMs slightly  bulge, no red or d/c  Mouth:  tonsils B  slightly  red and (+) white patches  Neck:  no thyromegaly.   Lungs:  normal respiratory effort, no intercostal retractions, no accessory muscle use, and normal breath sounds.   Heart:  normal rate, regular rhythm, and no murmur.   Abdomen:  soft, non-tender, no distention, no masses, and no guarding.   Extremities:  no pretibial edema bilaterally  Neurologic:  alert & oriented X3, strength normal in all extremities, and gait normal.   Psych:  Cognition and judgment appear intact. Alert and cooperative with normal attention span and concentration   Impression & Recommendations:  Problem # 1:  HEALTH SCREENING (ICD-V70.0) Td today cont exercise diet encouraged   Orders: Venipuncture (04540) TLB-BMP (Basic Metabolic Panel-BMET) (80048-METABOL) TLB-Lipid Panel (80061-LIPID) TLB-TSH (Thyroid Stimulating Hormone) (84443-TSH) TLB-CBC Platelet - w/Differential (85025-CBCD)  Problem # 2:  strep throat Rx amoxicillin  Problem # 3:  HYPERTENSION (ICD-401.9) ambulatory BPs  per patient in the 120/80 range repeated BP today: 140/100 (decreased from 160/110 ) no change , see instructions  His updated medication list for this problem includes:    Altace 10 Mg Caps (Ramipril) .Marland Kitchen...  Take one tablet daily  Problem # 4:  GOUT (ICD-274.9) denies any recent episodes  The following medications were removed from the medication list:    Colchicine 0.6 Mg Tabs (Colchicine) .Marland Kitchen... 1 tablet at onset of pain, then 1 by mouth q2hr until relieve or gi symptoms: max 4 tablet/24hr, then 1 by mouth two times a day for 10 days  Orders: T-Uric Acid (Blood) (98119-14782)  Complete Medication List: 1)  Altace 10 Mg Caps (Ramipril) .... Take one tablet daily 2)  Fluticasone Propionate 50 Mcg/act Susp (Fluticasone propionate) .... As needed 3)  Amoxicillin 500 Mg Tabs (Amoxicillin) .... 2 by mouth two times a day  Other Orders: Rapid Strep (95621) Tdap => 44yrs IM (30865) Admin 1st Vaccine (78469)  Patient Instructions: 1)  Check your blood pressure 2 or 3 times a week. If it is more than 140/85 consistently,please let us know  2)  Please schedule a follow-up appointment in 6 months .  Prescriptions: ALTACE 10 MG CAPS (RAMIPRIL) Take one tablet daily  #90 x 1   Entered by:   Shary Decamp   Authorized by:   Nolon Rod. Paz MD   Signed by:   Shary Decamp on 05/13/2008   Method used:   Electronically to        [facility]* (mail-order)             ,          Ph: [phone]       Fax: [phone]   RxID:   [id number] AMOXICILLIN 500 MG TABS (AMOXICILLIN) 2 by mouth two times a day  #40 x 0   Entered and Authorized by:   Nolon Rod. Paz MD   Signed by:   Nolon Rod. Paz MD on 05/13/2008   Method used:   [facility]        [phone][facility] (retail)       [address] [place], [place]  [address]       Ph: [phone] or [phone]       Fax: [phone]   RxID:   [id number]        Risk Factors:  Tobacco use:  never Passive smoke exposure:  no Drug use:  no Caffeine use:  0 drinks per day    Drinks per day:  <1    Comments:  socially Exercise:  yes    Times per week:  5  Laboratory Results    Other Tests  Rapid Strep:  positive     Tetanus/Td Vaccine    Vaccine Type: Tdap    Site: left deltoid    Mfr: GlaxoSmithKline    Dose: 0.5 ml    Route: IM    Given by: Shary Decamp    Exp. Date: 03/25/2010    Lot #: [id number]

## 2010-03-03 NOTE — Assessment & Plan Note (Signed)
Summary: Neck pain- R side x 5 dys rm 3   Vital Signs:  Patient Profile:   40 Years Old Male CC:      Neck pain - R side x 5 dys Height:     72.5 inches Weight:      313 pounds O2 Sat:      100 % O2 treatment:    Room Air Temp:     97.9 degrees F oral Pulse rate:   91 / minute Pulse rhythm:   regular Resp:     16 per minute BP sitting:   145 / 93  (right arm) Cuff size:   regular  Vitals Entered By: Areta Haber CMA (July 12, 2009 7:04 PM)                  Current Allergies: ! SUDAFED ! * EPHEDRINE     History of Present Illness History from: patient Chief Complaint: Neck pain - R side x 5 dys History of Present Illness: Patient with right-sided neck pain for  ~5 days.  He was working out, lifting at [employer], the next day had pain.  No other trauma.  No radiation of pain. It comes and goes and is described as sharp and tight.  Aleve helps a lot.  Current Problems: NECK SPRAIN AND STRAIN (ICD-847.0) HEALTH SCREENING (ICD-V70.0) HYPERTENSION (ICD-401.9) GOUT (ICD-274.9) ALLERGIC RHINITIS (ICD-477.9)   Current Meds ALTACE 10 MG CAPS (RAMIPRIL) Take one tablet daily SINGULAIR 10 MG TABS (MONTELUKAST SODIUM) 1 by mouth at bedtime NAPROSYN 500 MG TABS (NAPROXEN) 1 tab by mouth two times a day x10 days for pain and inflammation FLEXERIL 10 MG TABS (CYCLOBENZAPRINE HCL) 1 tab by mouth Q8-12 hours as needed spasms  REVIEW OF SYSTEMS Constitutional Symptoms      Denies fever, chills, night sweats, weight loss, weight gain, and fatigue.  Eyes       Denies change in vision, eye pain, eye discharge, glasses, contact lenses, and eye surgery. Ear/Nose/Throat/Mouth       Denies hearing loss/aids, change in hearing, ear pain, ear discharge, dizziness, frequent runny nose, frequent nose bleeds, sinus problems, sore throat, hoarseness, and tooth pain or bleeding.  Respiratory       Denies dry cough, productive cough, wheezing, shortness of breath, asthma, bronchitis, and  emphysema/COPD.  Cardiovascular       Denies murmurs, chest pain, and tires easily with exhertion.    Gastrointestinal       Denies stomach pain, nausea/vomiting, diarrhea, constipation, blood in bowel movements, and indigestion. Genitourniary       Denies painful urination, kidney stones, and loss of urinary control. Neurological       Denies paralysis, seizures, and fainting/blackouts. Musculoskeletal       Denies muscle pain, joint pain, joint stiffness, decreased range of motion, redness, swelling, muscle weakness, and gout.      Comments: neck pain x 5 dys Skin       Denies bruising, unusual mles/lumps or sores, and hair/skin or nail changes.  Psych       Denies mood changes, temper/anger issues, anxiety/stress, speech problems, depression, and sleep problems. Other Comments: Pt states that he has a stressful job. Pt states that he has started working out x2-3 mths.    Past History:  Past Medical History: Last updated: 03/14/2006 Allergic rhinitis Gout Hypertension  Past Surgical History: Last updated: 10/16/2008 Denies surgical history  Family History: Last updated: 05/13/2008 colon ca--no prostate ca--no MI--aunt age 36 (?) DM-- "runs  in the family" cervical Ca--mom, passed away at age 108 CHF - F HTN - F  Social History: Last updated: 10/16/2008 Married Occupation: works for [employer] Never Smoked Alcohol use-yes Drug use-no  Risk Factors: Alcohol Use: <1 (05/13/2008) Caffeine Use: 0 (05/13/2008) Diet: "not at all healthy" (05/13/2008) Exercise: yes (05/13/2008)  Risk Factors: Smoking Status: never (10/16/2008) Passive Smoke Exposure: no (05/13/2008) Physical Exam General appearance: well developed, well nourished, no acute distress Head: normocephalic, atraumatic Neck: TTP right upper trapezius and sternocleidomastoid.  FROM.  Spurling neg. Chest/Lungs: no rales, wheezes, or rhonchi bilateral, breath sounds equal without effort Heart: regular rate  and  rhythm, no murmur Neurological: grossly intact and non-focal Assessment New Problems: NECK SPRAIN AND STRAIN (ICD-847.0)   Plan New Medications/Changes: FLEXERIL 10 MG TABS (CYCLOBENZAPRINE HCL) 1 tab by mouth Q8-12 hours as needed spasms  #15 x 0, 07/12/2009, Hoyt Koch MD NAPROSYN 500 MG TABS (NAPROXEN) 1 tab by mouth two times a day x10 days for pain and inflammation  #20 x 0, 07/12/2009, Hoyt Koch MD  New Orders: New Patient Level III [phone]  The patient and/or caregiver has been counseled thoroughly with regard to medications prescribed including dosage, schedule, interactions, rationale for use, and possible side effects and they verbalize understanding.  Diagnoses and expected course of recovery discussed and will return if not improved as expected or if the condition worsens. Patient and/or caregiver verbalized understanding.  Prescriptions: FLEXERIL 10 MG TABS (CYCLOBENZAPRINE HCL) 1 tab by mouth Q8-12 hours as needed spasms  #15 x 0   Entered and Authorized by:   Hoyt Koch MD   Signed by:   Hoyt Koch MD on 07/12/2009   Method used:   Handwritten   RxID:   [id number] NAPROSYN 500 MG TABS (NAPROXEN) 1 tab by mouth two times a day x10 days for pain and inflammation  #20 x 0   Entered and Authorized by:   Hoyt Koch MD   Signed by:   Hoyt Koch MD on 07/12/2009   Method used:   Handwritten   RxID:   [id number]   Patient Instructions: 1)  Gentle ROM, massage, heating pad 2)  Naproxen for about a week two times a day  3)  Flexeril if needed especially at nighttime 4)  No upper body (overhead) working out for about a week 5)  RTC if not improving or Follow-up with your primary care physician   Orders Added: 1)  New Patient Level III [13244]

## 2010-03-03 NOTE — Progress Notes (Signed)
Summary: rx altace - dr Drue Novel;   Phone Note Call from Patient Call back at Work Phone [phone]   Caller: Patient Summary of Call: patient needs rx for altase 10mg  - MAIL ORDER  Initial call taken by: Okey Regal [place],  December 16, 2007 1:15 PM  Follow-up for Phone Call        Left msg rx ready for pickup .Kandice Hams  December 17, 2007 2:35 PM  Follow-up by: Kandice Hams,  December 17, 2007 2:35 PM      Prescriptions: ALTACE 10 MG CAPS (RAMIPRIL) Take one tablet daily  #90 x 0   Entered by:   Kandice Hams   Authorized by:   Nolon Rod. Paz MD   Signed by:   Kandice Hams on 12/17/2007   Method used:   Print then Give to Patient   RxID:   [id number]

## 2010-03-03 NOTE — Assessment & Plan Note (Signed)
Summary: wrist pain.cbs   Vital Signs:  Patient profile:   40 year old male Weight:      303.25 pounds Pulse rate:   114 / minute Pulse rhythm:   regular BP sitting:   132 / 84  (left arm) Cuff size:   large  Vitals Entered By: Army Fossa CMA (October 06, 2009 3:50 PM) CC: (R) wrist pain Comments x 3 weeks. concerned about gout.   History of Present Illness:  3 weeks ago developed pain and swelling of the right wrist, we called-in prednisone, he also took some colchicine with moderate relief. Symptoms never went away completely. Reports a remote right wrist fracture  Current Medications (verified): 1)  Altace 10 Mg Caps (Ramipril) .... Take One Tablet Daily 2)  Indomethacin Cr 75 Mg Cr-Caps (Indomethacin) .... Two Times A Day 3)  Prednisone 10 Mg Tabs (Prednisone) .... 4 By Mouth Once Daily X 2. 3x2,2x2,1x2  Allergies: 1)  ! Sudafed 2)  ! * Ephedrine  Past History:  Past Medical History: Reviewed history from 03/14/2006 and no changes required. Allergic rhinitis Gout Hypertension  Past Surgical History: remote right wrist fracture  Physical Exam  General:  alert and well-developed.   Extremities:  left wrist normal Right wrist slightly puffy and warm. Range of motion normal, mild tenderness to palpation   Impression & Recommendations:  Problem # 1:  GOUT (ICD-274.9) history of gout with recent ankle exacerbation and now with a three-week history of pain and swelling in the right wrist. Last uric acid around 10. Based on these, I will start allopurinol, concomitantly will prescribe prednisone and colchicine to prevent an exacerbation. the reason behind prescribing colchicine and prednisone was discussed with the patient He has taken allopurinol without problems in the past, and I were to call me if side effects or any rash developes. Aware of the need to followup with me for blood work His updated medication list for this problem includes:  Indomethacin Cr 75 Mg Cr-caps (Indomethacin) .Marland Kitchen..Marland Kitchen Two times a day    Allopurinol 100 Mg Tabs (Allopurinol) .Marland Kitchen... 2 by mouth daily    Colcrys 0.6 Mg Tabs (Colchicine) ..... One mouth every 4 hours as needed  Problem # 2:  face-to-face more than 15 minutes, more than 50% of the time counseling about treatment plan  Complete Medication List: 1)  Altace 10 Mg Caps (Ramipril) .... Take one tablet daily 2)  Indomethacin Cr 75 Mg Cr-caps (Indomethacin) .... Two times a day 3)  Prednisone 10 Mg Tabs (Prednisone) .... 2 by mouth once daily x 1 week 4)  Allopurinol 100 Mg Tabs (Allopurinol) .... 2 by mouth daily 5)  Colcrys 0.6 Mg Tabs (Colchicine) .... One mouth every 4 hours as needed  Patient Instructions: 1)  start colchicine one every 4 hours 2)  Start prednisone for one week 3)  After 24 hours, start allopurinol 2 tablets daily 4)  You can discontinue colchicine in 10 days and take it only as needed 5)  Followup with me in 3 weeks, call if problems Prescriptions: PREDNISONE 10 MG TABS (PREDNISONE) 2 by mouth once daily x 1 week  #14 x 0   Entered and Authorized by:   Nolon Rod. Paz MD   Signed by:   Nolon Rod. Paz MD on 10/06/2009   Method used:   Electronically to        [facility] [phone][facility] (retail)       [address] [place]  [place], [place]  [address]       Ph: [phone] or [phone]       Fax: [phone]   RxID:   [id number] COLCRYS 0.6 MG TABS (COLCHICINE) one mouth every 4 hours as needed  #90 x 1   Entered and Authorized by:   [employer] E. Paz MD   Signed by:   Nolon Rod. Paz MD on 10/06/2009   Method used:   [facility] to        [facility] [phone][facility] (retail)       [address] [place], [place]  [address]       Ph: [phone] or [phone]       Fax: [phone]   RxID:   [id number] ALLOPURINOL 100 MG TABS (ALLOPURINOL) 2 by mouth daily  #60 x 3   Entered and Authorized by:   Nolon Rod. Paz MD   Signed by:   Nolon Rod. Paz MD on 10/06/2009   Method used:    [facility]ically to        [facility] [phone]* (retail)       [address] [place], [place]  [address]       Ph: [phone] or [phone]       Fax: [phone]   RxID:   [id number]

## 2010-03-03 NOTE — Assessment & Plan Note (Signed)
Summary: cpx/cbs   Vital Signs:  Patient profile:   40 year old male Height:      72.5 inches Weight:      302.38 pounds BMI:     40.59 Pulse rate:   96 / minute Pulse rhythm:   regular BP sitting:   128 / 80  (left arm) Cuff size:   regular  Vitals Entered By: Army Fossa CMA (August 18, 2009 9:29 AM) CC: CPX: Fasting Comments Has been seeing ortho for gout flare ups.   History of Present Illness: complete physical exam  episode of gout in late  June , symptoms at the R ankle, saw ortho x 3 , had a local shot . Finally was seen yesterday, they did a joint aspiration and started indocin. Today symptoms are much better . Last flare up 2008  has taken a MVI , eating more protein  Preventive Screening-Counseling & Management  Caffeine-Diet-Exercise     Times/week: 4  Current Medications (verified): 1)  Altace 10 Mg Caps (Ramipril) .... Take One Tablet Daily 2)  Indomethacin Cr 75 Mg Cr-Caps (Indomethacin) .... Two Times A Day  Allergies: 1)  ! Sudafed 2)  ! * Ephedrine  Past History:  Past Medical History: Reviewed history from 03/14/2006 and no changes required. Allergic rhinitis Gout Hypertension  Past Surgical History: Reviewed history from 10/16/2008 and no changes required. Denies surgical history  Family History: Reviewed history from 05/13/2008 and no changes required. colon ca--no prostate ca--no MI--aunt age 22 (?) DM-- "runs in the family" cervical Ca--mom, passed away at age 28 CHF - F HTN - F  Social History: Married Occupation: works for [employer] 1 child  Never Smoked Alcohol use-- rarely  Drug use-no diet-- still poor exercise-- active, works out often  Review of Systems General:  Denies fever; has lost some wt, working out more . CV:  Denies chest pain or discomfort and palpitations. Resp:  Denies cough and shortness of breath. GI:  Denies bloody stools, diarrhea, nausea, and vomiting. GU:  Denies dysuria, hematuria, urinary  frequency, and urinary hesitancy.  Physical Exam  General:  alert, well-developed, and overweight-appearing.   Neck:  no masses and no thyromegaly.   Lungs:  normal respiratory effort, no intercostal retractions, no accessory muscle use, and normal breath sounds.   Heart:  normal rate, regular rhythm, no murmur, and no gallop.   Abdomen:  soft, non-tender, no distention, no masses, and no guarding.   Extremities:  no lower extremity pitting edema Left ankle normal Right ankle with very mild swelling, slightly warm Psych:  Cognition and judgment appear intact. Alert and cooperative with normal attention span and concentration, not anxious appearing and not depressed appearing.     Impression & Recommendations:  Problem # 1:  HEALTH SCREENING (ICD-V70.0) Td 4-10 Diet discussed, he admits to be eating poorly, has increased his protein intake lately Recommend a balance, Weight Watchers? nutritionist? Labs  Orders: Venipuncture (04540) TLB-BMP (Basic Metabolic Panel-BMET) (80048-METABOL) TLB-CBC Platelet - w/Differential (85025-CBCD) TLB-Hepatic/Liver Function Pnl (80076-HEPATIC) TLB-Lipid Panel (80061-LIPID) TLB-TSH (Thyroid Stimulating Hormone) (84443-TSH) Specimen Handling (98119)  Problem # 2:  GOUT (ICD-274.9) see history of present illness,had a  prolonged episode of gout in the last few weeks Doing better today Diet discuss Recent flareup probably related to increased protein intake Labs Since the last flareup was in 2008, will try to stay away from allopurinol  and  work on diet continue indomethacin, prednisone for a few days Will call Dr. Luiz Blare' office, they did  ankle aspiration and  according to the patient the fluid was sent up to the lab. We'll try to get reports Reassess in 3 months His updated medication list for this problem includes:    Indomethacin Cr 75 Mg Cr-caps (Indomethacin) .Marland Kitchen..Marland Kitchen Two times a day  Orders: TLB-Uric Acid, Blood (84550-URIC)  Complete  Medication List: 1)  Altace 10 Mg Caps (Ramipril) .... Take one tablet daily 2)  Indomethacin Cr 75 Mg Cr-caps (Indomethacin) .... Two times a day 3)  Prednisone 10 Mg Tabs (Prednisone) .... 4 by mouth once daily x 2. 3x2,2x2,1x2  Patient Instructions: 1)  Please schedule a follow-up appointment in 3 months .  Prescriptions: PREDNISONE 10 MG TABS (PREDNISONE) 4 by mouth once daily x 2. 3x2,2x2,1x2  #20 x 0   Entered and Authorized by:   Kevin Rod. Jhanae Jaskowiak MD   Signed by:   Kevin Rod. Jalen Daluz MD on 08/18/2009   Method used:   Print then Give to Patient   RxID:   [id number]    Risk Factors:  Exercise:  yes    Times per week:  4

## 2010-03-03 NOTE — Progress Notes (Signed)
Summary: Referral Request and labs    Phone Note Call from Patient Call back at [phone]   Caller: Patient Summary of Call: Pt wants a referral to a specialist that can help with his goute. Please advise what he should do. Pt can be  contacted at [phone] Initial call taken by: Lavell Islam,  August 24, 2009 10:46 AM  Follow-up for Phone Call        advise patient: - cholesterolis satisfactory -rest of labs normal -gout test is + (Uric acid is elevated): I rec diet, exercise and consider medicaction in 3 months, however if he likes the opinion of a specialist, refer to rheumatology Jose E. Paz MD  August 29, 2009 2:08 PM   Additional Follow-up for Phone Call Additional follow up Details #1::        Left message for pt to call back. Army Fossa CMA  August 30, 2009 9:10 AM     Additional Follow-up for Phone Call Additional follow up Details #2::    PATIENT CALLED----HE WILL CALL DANIELLE BACK AFTER 1:30 Follow-up by: Jerolyn Shin,  August 31, 2009 12:39 PM  Additional Follow-up for Phone Call Additional follow up Details #3:: Details for Additional Follow-up Action Taken: I spoke with pt and he states he will try diet and exercise for right now. He is aware of all lab results. Army Fossa CMA  August 31, 2009 1:30 PM

## 2010-03-03 NOTE — Progress Notes (Signed)
Summary: rx  Phone Note Refill Request Message from:  Patient on July 22, 2007 1:43 PM  Refills Requested: Medication #1:  ALTACE 10 MG CAPS Take one tablet daily MAIL ORDER-PENDING APPT FOR 08/23/07 WITH DR PAZ CALL WHEN READY  Initial call taken by: Doristine Devoid,  July 22, 2007 1:43 PM  Follow-up for Phone Call        Left message on machine to return call..........Marland KitchenShary Decamp  July 22, 2007 1:55 PM  left message on machine prescription ready for pick up...........Marland KitchenShary Decamp  July 23, 2007 1:05 PM       Prescriptions: ALTACE 10 MG CAPS (RAMIPRIL) Take one tablet daily  #90 x 0   Entered by:   Shary Decamp   Authorized by:   Nolon Rod. Paz MD   Signed by:   Shary Decamp on 07/23/2007   Method used:   Print then Give to Patient   RxID:   [id number]

## 2010-03-16 ENCOUNTER — Ambulatory Visit: Payer: Self-pay | Admitting: Internal Medicine

## 2010-05-09 ENCOUNTER — Telehealth: Payer: Self-pay | Admitting: *Deleted

## 2010-05-09 ENCOUNTER — Other Ambulatory Visit: Payer: Self-pay | Admitting: Internal Medicine

## 2010-05-09 MED ORDER — PREDNISONE 20 MG PO TABS: ORAL_TABLET | ORAL | Status: DC

## 2010-05-09 NOTE — Telephone Encounter (Signed)
Already sent by another provider

## 2010-05-09 NOTE — Telephone Encounter (Signed)
Spoke w/ pts wife she states pt needs Prednisone for his gout, he was prescribed last 09/2009. Explained to pts wife pt would need to been in order to get prednisone. Agree?

## 2010-05-09 NOTE — Telephone Encounter (Signed)
Prednisone 20 mg #9 ; 1/2 tid prn with food

## 2010-05-09 NOTE — Telephone Encounter (Signed)
Spoke w/ pt aware medication sent to pharmacy.

## 2010-05-09 NOTE — Telephone Encounter (Signed)
Dr.Hopper called in for pt.

## 2010-05-13 ENCOUNTER — Other Ambulatory Visit: Payer: Self-pay | Admitting: Internal Medicine

## 2010-06-24 ENCOUNTER — Inpatient Hospital Stay (INDEPENDENT_AMBULATORY_CARE_PROVIDER_SITE_OTHER)
Admission: RE | Admit: 2010-06-24 | Discharge: 2010-06-24 | Disposition: A | Discharge status: Home / Self Care | Payer: BC Managed Care – PPO | Source: Ambulatory Visit | Attending: Family Medicine | Admitting: Family Medicine

## 2010-06-24 ENCOUNTER — Encounter: Payer: Self-pay | Admitting: Family Medicine

## 2010-06-24 DIAGNOSIS — J309 Allergic rhinitis, unspecified: Secondary | ICD-10-CM

## 2010-06-24 DIAGNOSIS — J0100 Acute maxillary sinusitis, unspecified: Secondary | ICD-10-CM

## 2010-07-21 ENCOUNTER — Other Ambulatory Visit: Payer: Self-pay | Admitting: Internal Medicine

## 2010-07-21 NOTE — Telephone Encounter (Signed)
Please call and schedule appt for pt. No additional refills without an appt.

## 2010-07-28 NOTE — Telephone Encounter (Signed)
LMOM to call and schedule appt with Dr Drue Novel

## 2010-08-02 ENCOUNTER — Encounter: Payer: Self-pay | Admitting: Internal Medicine

## 2010-08-02 NOTE — Telephone Encounter (Signed)
LMOM on work / cell phone to call and schedule appt---also mailed letter

## 2010-10-19 ENCOUNTER — Other Ambulatory Visit: Payer: Self-pay | Admitting: Internal Medicine

## 2010-11-28 ENCOUNTER — Other Ambulatory Visit: Payer: Self-pay | Admitting: Internal Medicine

## 2010-11-28 MED ORDER — ALLOPURINOL 100 MG PO TABS: 100.0000 mg | ORAL_TABLET | Freq: Every day | ORAL | Status: DC

## 2010-11-28 NOTE — Telephone Encounter (Signed)
Done

## 2010-12-13 ENCOUNTER — Other Ambulatory Visit: Payer: Self-pay | Admitting: Internal Medicine

## 2010-12-14 NOTE — Telephone Encounter (Signed)
Ok 1 month , no RF, has an appointment w/ me soon

## 2010-12-29 ENCOUNTER — Emergency Department (INDEPENDENT_AMBULATORY_CARE_PROVIDER_SITE_OTHER)
Admission: EM | Admit: 2010-12-29 | Discharge: 2010-12-29 | Disposition: A | Discharge status: Home / Self Care | Payer: BC Managed Care – PPO | Source: Home / Self Care | Attending: Emergency Medicine | Admitting: Emergency Medicine

## 2010-12-29 ENCOUNTER — Encounter: Payer: Self-pay | Admitting: *Deleted

## 2010-12-29 DIAGNOSIS — J111 Influenza due to unidentified influenza virus with other respiratory manifestations: Secondary | ICD-10-CM

## 2010-12-29 DIAGNOSIS — J101 Influenza due to other identified influenza virus with other respiratory manifestations: Secondary | ICD-10-CM

## 2010-12-29 HISTORY — DX: Essential (primary) hypertension: I10

## 2010-12-29 LAB — POCT INFLUENZA A/B
Influenza A, POC: POSITIVE
Influenza B, POC: NEGATIVE

## 2010-12-29 MED ORDER — OSELTAMIVIR PHOSPHATE 75 MG PO CAPS: 75.0000 mg | ORAL_CAPSULE | Freq: Two times a day (BID) | ORAL | Status: DC

## 2010-12-29 NOTE — ED Provider Notes (Signed)
History     CSN: [id number] Arrival date & time: 12/29/2010  2:13 PM   First MD Initiated Contact with Patient 12/29/10 1426      Chief Complaint  Patient presents with  . Cough  . Headache   c/o HA, productive cough, fever, and achy x 2 days. He has taken tylenol, coricidin, and vit c. Without improvement.   Patient is a 40 y.o. male presenting with cough and flu symptoms. The history is provided by the patient.  Cough This is a new problem. The current episode started 2 days ago. The problem occurs every few hours. The problem has not changed since onset.The cough is non-productive. The maximum temperature recorded prior to his arrival was 101 to 101.9 F. The fever has been present for 1 to 2 days. Associated symptoms include chills, sweats and myalgias. Pertinent negatives include no chest pain, no ear congestion, no ear pain, no rhinorrhea, no sore throat, no shortness of breath, no wheezing and no eye redness. Treatments tried: Ibuprofen. The treatment provided no relief.  Influenza Pertinent negatives include no chest pain and no shortness of breath.    Past Medical History  Diagnosis Date  . Hypertension   . Gout     History reviewed. No pertinent past surgical history.  Family History  Problem Relation Age of Onset  . Uterine cancer Mother     History  Substance Use Topics  . Smoking status: Never Smoker   . Smokeless tobacco: Not on file  . Alcohol Use:       Review of Systems  Constitutional: Positive for fever, chills, diaphoresis, activity change (fatigue) and fatigue.  HENT: Positive for congestion. Negative for ear pain, sore throat, rhinorrhea, neck pain and ear discharge.   Eyes: Negative for discharge and redness.  Respiratory: Positive for cough (productive of clearest yellow sputum). Negative for chest tightness, shortness of breath and wheezing.   Cardiovascular: Negative for chest pain and palpitations.  Gastrointestinal: Negative for abdominal  distention.  Genitourinary: Negative.   Musculoskeletal: Positive for myalgias.  Neurological: Negative.   Hematological: Negative.   Psychiatric/Behavioral: Negative.     Allergies  Pseudoephedrine  Home Medications   Current Outpatient Rx  Name Route Sig Dispense Refill  . ALLOPURINOL 100 MG PO TABS  TAKE 2 TABLETS EVERY DAY 60 tablet 0  . OSELTAMIVIR PHOSPHATE 75 MG PO CAPS Oral Take 1 capsule (75 mg total) by mouth every 12 (twelve) hours. 10 capsule 0  . RAMIPRIL 10 MG PO CAPS  TAKE 1 CAPSULE DAILY (DUE FOR APPOINTMENT BEFORE ADDITIONAL REFILLS) 90 capsule 0    BP 180/105  Pulse 105  Temp(Src) 98.5 F (36.9 C) (Oral)  Resp 18  Ht 6' (1.829 m)  Wt 310 lb 12 oz (140.955 kg)  BMI 42.15 kg/m2  SpO2 95% Blood pressure repeated 168/98 Physical Exam  Nursing note and vitals reviewed. Constitutional: He is oriented to person, place, and time. He appears well-developed and well-nourished. No distress.       Very fatigued, but not toxic  HENT:  Head: Normocephalic and atraumatic.  Right Ear: Tympanic membrane normal.  Left Ear: Tympanic membrane normal.  Nose: Nose normal.  Mouth/Throat: Oropharynx is clear and moist. No oropharyngeal exudate.  Eyes: Right eye exhibits no discharge. Left eye exhibits no discharge. No scleral icterus.  Neck: Neck supple.  Cardiovascular: Normal rate, regular rhythm and normal heart sounds.   Pulmonary/Chest: No respiratory distress. He has no wheezes. He has no rhonchi. He has no  rales (very fatigued, but not toxic).  Lymphadenopathy:    He has cervical adenopathy (a few shoddy anterior cervical nodes).  Neurological: He is alert and oriented to person, place, and time.  Skin: Skin is warm and dry.    ED Course  Procedures (including critical care time)   Labs Reviewed  POCT INFLUENZA A/B   No results found.   1. Influenza A       MDM   flu test today positive for influenza A. Because his symptoms have started within the  past two days, he is a candidate for Tamiflu, which I prescribed today with precautions and instructions.  Contagious and hygiene discussed.Also advised him to faithfully take his BP medication, push fluids and other symptomatic care. Follow-up with them one week with his PCP to have his BP rechecked, explain the importance of this.Marland Kitchen Options discussed. Risks, benefits, alternatives discussed. Patient voiced understanding and agreement. At his request, I wrote a note excusing from work 11/27  Thru 01/02/11.        Lonell Face, MD 12/30/10 [phone]

## 2010-12-29 NOTE — ED Notes (Signed)
Pt c/o HA, productive cough, fever, and achy x 2 days. He has taken tylenol, coricidin, and vit c.

## 2011-01-02 NOTE — Progress Notes (Signed)
Summary: SINUS PROBLEMS/TJ rm 4   Vital Signs:  Patient Profile:   40 Years Old Male CC:      sinus problems x 1wk Height:     72.5 inches Weight:      310.50 pounds O2 Sat:      97 % O2 treatment:    Room Air Temp:     98.5 degrees F oral Pulse rate:   97 / minute Resp:     18 per minute BP sitting:   133 / 95  (left arm) Cuff size:   large  Vitals Entered By: Clemens Catholic LPN (Jun 24, 2010 6:43 PM)                  Updated Prior Medication List: ALTACE 10 MG CAPS (RAMIPRIL) Take one tablet daily ALLOPURINOL 100 MG TABS (ALLOPURINOL) 2 by mouth daily  Current Allergies (reviewed today): ! SUDAFED ! * EPHEDRINEHistory of Present Illness Chief Complaint: sinus problems x 1wk History of Present Illness:  Subjective: Patient complains of onset of sinus congestion over a week ago after cutting grass. His sinus congestion has persisted, and he now has bilateral facial pressure, worse on the left.  His left upper teeth now feel painful, and his left ear feels clogged No sore throat No cough No pleuritic pain No wheezing + post-nasal drainage No itchy/red eyes No hemoptysis No SOB No fever/chills No nausea No vomiting No abdominal pain No diarrhea No skin rashes No fatigue No myalgias No headache Used OTC meds without relief   REVIEW OF SYSTEMS Constitutional Symptoms      Denies fever, chills, night sweats, weight loss, weight gain, and fatigue.  Eyes       Denies change in vision, eye pain, eye discharge, glasses, contact lenses, and eye surgery. Ear/Nose/Throat/Mouth       Complains of ear pain, frequent runny nose, and sinus problems.      Denies hearing loss/aids, change in hearing, ear discharge, dizziness, frequent nose bleeds, sore throat, hoarseness, and tooth pain or bleeding.  Respiratory       Denies dry cough, productive cough, wheezing, shortness of breath, asthma, bronchitis, and emphysema/COPD.  Cardiovascular       Denies murmurs, chest  pain, and tires easily with exhertion.    Gastrointestinal       Denies stomach pain, nausea/vomiting, diarrhea, constipation, blood in bowel movements, and indigestion. Genitourniary       Denies painful urination, kidney stones, and loss of urinary control. Neurological       Denies paralysis, seizures, and fainting/blackouts. Musculoskeletal       Denies muscle pain, joint pain, joint stiffness, decreased range of motion, redness, swelling, muscle weakness, and gout.  Skin       Denies bruising, unusual mles/lumps or sores, and hair/skin or nail changes.  Psych       Denies mood changes, temper/anger issues, anxiety/stress, speech problems, depression, and sleep problems. Other Comments: pt c/o LT ear feels "stuffy", sinus congestion and drainage, sinus pain/pressure, and his teeth hurt x 1wk. he has taken an OTC Tylenol with D and Allegra with no relief.   Past History:  Past Medical History: Reviewed history from 03/14/2006 and no changes required. Allergic rhinitis Gout Hypertension  Past Surgical History: Reviewed history from 10/06/2009 and no changes required. remote right wrist fracture  Family History: Reviewed history from 05/13/2008 and no changes required. colon ca--no prostate ca--no MI--aunt age 61 (?) DM-- "runs in the family" cervical Ca--mom, passed away  at age 11 CHF - F HTN - F  Social History: Reviewed history from 08/18/2009 and no changes required. Married Occupation: works for [employer] 1 child  Never Smoked Alcohol use-- rarely  Drug use-no diet-- still poor exercise-- active, works out often   Objective:  Appearance:  Patient appears healthy, stated age, and in no acute distress  Eyes:  Pupils are equal, round, and reactive to light and accomodation.  Extraocular movement is intact.  Conjunctivae are not inflamed.  Ears:  Canals normal.  Tympanic membranes normal.   Nose:  congested turbinates bilaterally with left maxillary sinus  tenderness Mouth:  No lesions; no tooth tenderness Pharynx:  Normal  Neck:  Supple.  No adenopathy is present.   Lungs:  Clear to auscultation.  Breath sounds are equal.  Heart:  Regular rate and rhythm without murmurs, rubs, or gallops.  Abdomen:  Nontender without masses or hepatosplenomegaly.  Bowel sounds are present.  No CVA or flank tenderness.  Skin:  No rash Assessment  Assessed ALLERGIC RHINITIS as deteriorated - Donna Christen MD New Problems: ACUTE MAXILLARY SINUSITIS (ICD-461.0)   Plan New Medications/Changes: FLONASE 50 MCG/ACT SUSP (FLUTICASONE PROPIONATE) 2 sprays in each nostril once daily.  #1 x 1, 06/24/2010, Donna Christen MD AMOXICILLIN 875 MG TABS (AMOXICILLIN) One by mouth two times a day  #20 x 0, 06/24/2010, Donna Christen MD  New Orders: Services provided After hours-Weekends-Holidays [99051] Est. Patient Level III [99213] Planning Comments:   Begin amoxicillin, expectorant, topical decongestant, and resume Flonase.  Recommend saline irrigation.  Increase fluid intake Recommend N95 mask when mowing. Followup with ENT if not improving 7 to 10 days   The patient and/or caregiver has been counseled thoroughly with regard to medications prescribed including dosage, schedule, interactions, rationale for use, and possible side effects and they verbalize understanding.  Diagnoses and expected course of recovery discussed and will return if not improved as expected or if the condition worsens. Patient and/or caregiver verbalized understanding.  Prescriptions: FLONASE 50 MCG/ACT SUSP (FLUTICASONE PROPIONATE) 2 sprays in each nostril once daily.  #1 x 1   Entered and Authorized by:   Donna Christen MD   Signed by:   Donna Christen MD on 06/24/2010   Method used:   Print then Give to Patient   RxID:   [id number] AMOXICILLIN 875 MG TABS (AMOXICILLIN) One by mouth two times a day  #20 x 0   Entered and Authorized by:   Donna Christen MD   Signed by:   Donna Christen  MD on 06/24/2010   Method used:   Print then Give to Patient   RxID:   [id number]   Patient Instructions: 1)  Take Mucinex  (guaifenesin) twice daily for congestion. 2)  Increase fluid intake  3)  May use Afrin nasal spray (or generic oxymetazoline) twice daily for about 5 days.  Also recommend using saline nasal spray several times daily and/or saline nasal irrigation. 4)  Use an N95 mask when cutting grass.Marland Kitchen 5)  Followup with ENT doctor if not improving 7 to 10 days.   Orders Added: 1)  Services provided After hours-Weekends-Holidays [99051] 2)  Est. Patient Level III [41324]

## 2011-01-03 ENCOUNTER — Encounter: Payer: Self-pay | Admitting: Internal Medicine

## 2011-01-05 ENCOUNTER — Encounter: Payer: Self-pay | Admitting: Internal Medicine

## 2011-01-05 ENCOUNTER — Ambulatory Visit (INDEPENDENT_AMBULATORY_CARE_PROVIDER_SITE_OTHER): Payer: BC Managed Care – PPO | Admitting: Internal Medicine

## 2011-01-05 DIAGNOSIS — Z0000 Encounter for general adult medical examination without abnormal findings: Secondary | ICD-10-CM

## 2011-01-05 DIAGNOSIS — M109 Gout, unspecified: Secondary | ICD-10-CM

## 2011-01-05 DIAGNOSIS — I10 Essential (primary) hypertension: Secondary | ICD-10-CM

## 2011-01-05 NOTE — Patient Instructions (Addendum)
Once you are completely well, get the flu shot Came back within few days for labs, fasting: CMP CBC FLP TSH--- dx v70 Uric acid-- dx gout  Check the  blood pressure 2 or 3 times a week, be sure it is less than 140/85. If it is consistently higher, let me know

## 2011-01-05 NOTE — Assessment & Plan Note (Signed)
No recent problems, labs

## 2011-01-05 NOTE — Progress Notes (Signed)
  Subjective:    Patient ID: Kevin Baxter, male    DOB: 01/24/1971, 40 y.o.   MRN: [id number]  HPI Complete physical exam  Past Medical History: Allergic rhinitis Gout Hypertension  Past Surgical History: Denies surgical history  Family History: colon ca--no prostate ca--no MI--aunt age 30 (?) DM-- "runs in the family" cervical Ca--mom, passed away at age 33 CHF - F HTN - F  Social History: Married, 1 child , wife is pregnant Occupation: works for [employer] Never Smoked Alcohol use-- rarely  Drug use-no diet--  Described as "terrible" exercise-- active, unable to go the the gym d/t schedule  Review of Systems No chest pain or shortness or breath No nausea, vomiting, diarrhea or blood in the stools. Was diagnosed with the flu a few days ago, feeling much better. Good compliance with allopurinol and BP meds, ambulatory blood pressures usually 120/80, 130/80. BP was elevated one time recently  in the setting of having the flu. no dysuria, gross hematuria. No rash A lot of stress at work but no anxiety or depression.     Objective:   Physical Exam  Constitutional: He is oriented to person, place, and time. He appears well-developed.       Overweight appearing  HENT:  Head: Normocephalic and atraumatic.  Neck: No thyromegaly present.  Cardiovascular: Normal rate, regular rhythm and normal heart sounds.   No murmur heard. Pulmonary/Chest: Effort normal and breath sounds normal. No respiratory distress. He has no wheezes. He has no rales.  Abdominal: Soft. He exhibits no distension. There is no tenderness. There is no rebound and no guarding.  Genitourinary:       Declined DRE  Musculoskeletal: He exhibits no edema.  Neurological: He is alert and oriented to person, place, and time.  Skin: He is not diaphoretic.  Psychiatric: He has a normal mood and affect. His behavior is normal. Judgment and thought content normal.       Assessment & Plan:

## 2011-01-05 NOTE — Assessment & Plan Note (Signed)
BP elevated recently in the setting of the flu, amb BPs usually normal See instructions Labs

## 2011-01-05 NOTE — Assessment & Plan Note (Signed)
Td 4-10 Had the flu last week, RTC once completely well for a shot Never had a cscope  Pt is [place], declined a DRE today,we agreed  will start prostate ca and CRC screening next year Diet , exercise  Discussed, it is extremely important he eats healthier, offered nutritionist referral Labs

## 2011-01-15 ENCOUNTER — Other Ambulatory Visit: Payer: Self-pay | Admitting: Internal Medicine

## 2011-02-09 ENCOUNTER — Ambulatory Visit: Payer: BC Managed Care – PPO | Admitting: Internal Medicine

## 2011-04-26 ENCOUNTER — Other Ambulatory Visit: Payer: Self-pay | Admitting: *Deleted

## 2011-04-26 NOTE — Telephone Encounter (Signed)
Last filled 12-13-10 #60, last OV 01-05-11. Pt would like to pick up Rx.

## 2011-04-27 MED ORDER — RAMIPRIL 10 MG PO CAPS: ORAL_CAPSULE | ORAL | Status: DC

## 2011-04-27 MED ORDER — ALLOPURINOL 100 MG PO TABS: ORAL_TABLET | ORAL | Status: DC

## 2011-04-27 NOTE — Telephone Encounter (Signed)
Ok 1 month supply , no RF Also, he never got his labs done (see last OV): Schedule labs ASAP, no further RF w/o them

## 2011-04-27 NOTE — Telephone Encounter (Signed)
Discuss with patient, Rx sent will call back to schedule lab appt.

## 2011-07-12 ENCOUNTER — Ambulatory Visit: Payer: BC Managed Care – PPO | Admitting: Internal Medicine

## 2011-07-14 ENCOUNTER — Other Ambulatory Visit: Payer: Self-pay | Admitting: Internal Medicine

## 2011-07-14 MED ORDER — ALLOPURINOL 100 MG PO TABS: ORAL_TABLET | ORAL | Status: DC

## 2011-07-14 MED ORDER — RAMIPRIL 10 MG PO CAPS: ORAL_CAPSULE | ORAL | Status: DC

## 2011-07-14 NOTE — Telephone Encounter (Signed)
Patient called and has a follow up appt for 6.21.13, but states he only has 3-pills left of the following and needs [facility]ills [facility] [place]  1-Allopurinol 100mg  tab   2-Ramipiril 10mg  capsule  Both filled last 3.27.13, patient states he will take enough to get him thru to his appointment or can call in 30-days wortj Patient call back# [phone]

## 2011-07-14 NOTE — Telephone Encounter (Signed)
Refill done.  

## 2011-07-21 ENCOUNTER — Ambulatory Visit (INDEPENDENT_AMBULATORY_CARE_PROVIDER_SITE_OTHER): Payer: Managed Care, Other (non HMO) | Admitting: Internal Medicine

## 2011-07-21 VITALS — BP 136/82 | HR 104 | Temp 98.1°F | Wt 303.0 lb

## 2011-07-21 DIAGNOSIS — M109 Gout, unspecified: Secondary | ICD-10-CM

## 2011-07-21 DIAGNOSIS — Z0000 Encounter for general adult medical examination without abnormal findings: Secondary | ICD-10-CM

## 2011-07-21 DIAGNOSIS — N529 Male erectile dysfunction, unspecified: Secondary | ICD-10-CM

## 2011-07-21 DIAGNOSIS — I10 Essential (primary) hypertension: Secondary | ICD-10-CM

## 2011-07-21 LAB — COMPREHENSIVE METABOLIC PANEL
ALT: 19 U/L (ref 0–53)
AST: 18 U/L (ref 0–37)
Albumin: 4.2 g/dL (ref 3.5–5.2)
Alkaline Phosphatase: 55 U/L (ref 39–117)
BUN: 13 mg/dL (ref 6–23)
CO2: 28 mEq/L (ref 19–32)
Calcium: 9.5 mg/dL (ref 8.4–10.5)
Chloride: 103 mEq/L (ref 96–112)
Creatinine, Ser: 1.2 mg/dL (ref 0.4–1.5)
GFR: 81.95 mL/min (ref >60.00)
Glucose, Bld: 95 mg/dL (ref 70–99)
Potassium: 4 mEq/L (ref 3.5–5.1)
Sodium: 139 mEq/L (ref 135–145)
Total Bilirubin: 1.3 mg/dL — ABNORMAL HIGH (ref 0.3–1.2)
Total Protein: 7.8 g/dL (ref 6.0–8.3)

## 2011-07-21 LAB — CBC WITH DIFFERENTIAL/PLATELET
Basophils Absolute: 0 10*3/uL (ref 0.0–0.1)
Basophils Relative: 0.4 % (ref 0.0–3.0)
Eosinophils Absolute: 0.2 10*3/uL (ref 0.0–0.7)
Eosinophils Relative: 4.6 % (ref 0.0–5.0)
HCT: 52.7 % — ABNORMAL HIGH (ref 39.0–52.0)
Hemoglobin: 17.1 g/dL — ABNORMAL HIGH (ref 13.0–17.0)
Lymphocytes Relative: 30.9 % (ref 12.0–46.0)
Lymphs Abs: 1.5 10*3/uL (ref 0.7–4.0)
MCHC: 32.5 g/dL (ref 30.0–36.0)
MCV: 77.3 fl — ABNORMAL LOW (ref 78.0–100.0)
Monocytes Absolute: 0.5 10*3/uL (ref 0.1–1.0)
Monocytes Relative: 9.6 % (ref 3.0–12.0)
Neutro Abs: 2.7 10*3/uL (ref 1.4–7.7)
Neutrophils Relative %: 54.5 % (ref 43.0–77.0)
Platelets: 223 10*3/uL (ref 150.0–400.0)
RBC: 6.82 Mil/uL — ABNORMAL HIGH (ref 4.22–5.81)
RDW: 16.6 % — ABNORMAL HIGH (ref 11.5–14.6)
WBC: 4.9 10*3/uL (ref 4.5–10.5)

## 2011-07-21 LAB — LIPID PANEL
Cholesterol: 165 mg/dL (ref 0–200)
HDL: 50.4 mg/dL (ref >39.00)
LDL Cholesterol: 94 mg/dL (ref 0–99)
Total CHOL/HDL Ratio: 3
Triglycerides: 104 mg/dL (ref 0.0–149.0)
VLDL: 20.8 mg/dL (ref 0.0–40.0)

## 2011-07-21 LAB — TSH: TSH: 1.08 u[IU]/mL (ref 0.35–5.50)

## 2011-07-21 LAB — URIC ACID: Uric Acid, Serum: 8.5 mg/dL — ABNORMAL HIGH (ref 4.0–7.8)

## 2011-07-21 MED ORDER — TADALAFIL 20 MG PO TABS: 10.0000 mg | ORAL_TABLET | ORAL | Status: DC | PRN

## 2011-07-21 MED ORDER — ALLOPURINOL 100 MG PO TABS: ORAL_TABLET | ORAL | Status: DC

## 2011-07-21 NOTE — Assessment & Plan Note (Signed)
Labs never done, labs today

## 2011-07-21 NOTE — Progress Notes (Signed)
  Subjective:    Patient ID: Kevin Baxter, male    DOB: December 08, 1970, 41 y.o.   MRN: [id number]  HPI Routine office visit Chief complaint today is erectile dysfunction, reports poor erections and unable to hold the erections for to long. ED going on for 9 months. He has a one month old baby at home. He has improved his life style  significantly, see social history,  He was hoping to decrease ED w/ a healthier life style   Past Medical History: Allergic rhinitis Gout Hypertension  Past Surgical History: Denies surgical history  Family History: colon ca--no prostate ca--no MI--aunt age 38 (?) DM-- "runs in the family" cervical Ca--mom, passed away at age 61 CHF - F HTN - F  Social History: Married, 2 child , 2 child born ~05-2011 Occupation: works for [employer] Never Smoked Alcohol use-- rarely   Drug use-no diet--  improved since the last office visit exercise-- doing great, running 3.5 miles 5 times a week  Review of Systems No chest pain or shortness or breath No claudication Denies anxiety or depression per se, no marginal issues. Gallop well-controlled, good medication compliance    Objective:   Physical Exam Alert oriented x3, overweight-appearing. Abdomen, soft, nontender, no bruit. Extremities, no edema, normal femoral pulses.    Assessment & Plan:   Today , I spent more than 26  min with the patient, >50% of the time counseling

## 2011-07-21 NOTE — Assessment & Plan Note (Signed)
New issue, normal vascular exam Counseled cialis trial, s/e and how to use it discussed

## 2011-07-21 NOTE — Assessment & Plan Note (Signed)
Well controlled, life style improved!

## 2011-07-23 ENCOUNTER — Encounter: Payer: Self-pay | Admitting: Internal Medicine

## 2011-07-27 ENCOUNTER — Other Ambulatory Visit: Payer: Self-pay | Admitting: *Deleted

## 2011-07-27 MED ORDER — ALLOPURINOL 300 MG PO TABS: 300.0000 mg | ORAL_TABLET | Freq: Every day | ORAL | Status: DC

## 2011-08-13 ENCOUNTER — Other Ambulatory Visit: Payer: Self-pay | Admitting: Internal Medicine

## 2011-08-14 NOTE — Telephone Encounter (Signed)
Last OV 07-21-11

## 2011-08-16 NOTE — Telephone Encounter (Signed)
Okay to refill cultures, he has a history of gout. Please change sig  to one by mouth 3 times a day when necessary #60, no refills

## 2011-08-16 NOTE — Telephone Encounter (Signed)
Rx sent 

## 2011-09-04 ENCOUNTER — Other Ambulatory Visit: Payer: Self-pay

## 2011-09-04 NOTE — Telephone Encounter (Signed)
I called [phone] and requested prior auth paperwork to be faxed.Case ID # [id number].  Once paperwork received it will be completed by Triage: Tresa Moore

## 2011-09-12 NOTE — Telephone Encounter (Signed)
Late entry: notified CMA Kristie Cowman whom advised that the forms had not been received on as of 09-05-11 and will call again to get an update of the forms being sent per took over process on 09-05-11, was also advised again by CMA Colon Branch the forms have not been sent as of 09-07-11, this nurse will call again today to advise

## 2011-09-12 NOTE — Telephone Encounter (Signed)
Noted form has been received, completed and faxed on 09-08-11 will [facility]

## 2011-09-14 NOTE — Telephone Encounter (Signed)
Called [facility] and was advised to re-fax forms per Greig Castilla noted the forms had not been received, verified fax number, will await response and implement as directed

## 2011-09-18 NOTE — Telephone Encounter (Signed)
Prior auth has been approved

## 2011-09-26 ENCOUNTER — Other Ambulatory Visit: Payer: Self-pay | Admitting: Internal Medicine

## 2011-09-26 NOTE — Telephone Encounter (Signed)
Rx sent 

## 2011-12-18 ENCOUNTER — Emergency Department (INDEPENDENT_AMBULATORY_CARE_PROVIDER_SITE_OTHER)
Admission: EM | Admit: 2011-12-18 | Discharge: 2011-12-18 | Disposition: A | Discharge status: Home / Self Care | Payer: Managed Care, Other (non HMO) | Source: Home / Self Care | Attending: Emergency Medicine | Admitting: Emergency Medicine

## 2011-12-18 ENCOUNTER — Encounter: Payer: Self-pay | Admitting: Emergency Medicine

## 2011-12-18 DIAGNOSIS — J069 Acute upper respiratory infection, unspecified: Secondary | ICD-10-CM

## 2011-12-18 DIAGNOSIS — J329 Chronic sinusitis, unspecified: Secondary | ICD-10-CM

## 2011-12-18 MED ORDER — AMOXICILLIN 875 MG PO TABS: 875.0000 mg | ORAL_TABLET | Freq: Two times a day (BID) | ORAL | Status: DC

## 2011-12-18 NOTE — ED Notes (Signed)
Patient reports feeling congested for past 3 weeks; has tried OTCs to no avail.

## 2011-12-18 NOTE — ED Provider Notes (Signed)
History     CSN: [id number]  Arrival date & time 12/18/11  1908   First MD Initiated Contact with Patient 12/18/11 1911      Chief Complaint  Patient presents with  . Nasal Congestion  . Hoarse  . Ear Fullness    (Consider location/radiation/quality/duration/timing/severity/associated sxs/prior treatment) HPI Trennis is a 41 y.o. male who complains of onset of cold symptoms for 3 weeks.  The symptoms are constant and mild-moderate in severity.  He has been trying multiple OTC remedies but nothing is helping much.  He tends to get sinus infections this time of year. No sore throat No cough No pleuritic pain No wheezing + nasal congestion + post-nasal drainage + sinus pain/pressure No chest congestion No itchy/red eyes No earache No hemoptysis No SOB No chills/sweats No fever No nausea No vomiting No abdominal pain No diarrhea No skin rashes No fatigue No myalgias + headache    Past Medical History  Diagnosis Date  . Hypertension   . Gout   . Allergy   . Wrist fracture, right     No past surgical history on file.  Family History  Problem Relation Age of Onset  . Uterine cancer Mother   . Cancer Mother     CERVICAL  . Hypertension Father   . Heart disease Other     MI  . Diabetes Other     History  Substance Use Topics  . Smoking status: Never Smoker   . Smokeless tobacco: Not on file  . Alcohol Use: Yes      Review of Systems  All other systems reviewed and are negative.    Allergies  Pseudoephedrine  Home Medications   Current Outpatient Rx  Name  Route  Sig  Dispense  Refill  . ALLOPURINOL 300 MG PO TABS   Oral   Take 1 tablet (300 mg total) by mouth daily.   30 tablet   6   . AMOXICILLIN 875 MG PO TABS   Oral   Take 1 tablet (875 mg total) by mouth 2 (two) times daily.   16 tablet   0   . COLCHICINE 0.6 MG PO TABS      one by mouth 3 times a day when necessary   60 tablet   0   . FLUTICASONE PROPIONATE 50 MCG/ACT  NA SUSP   Nasal   Place 2 sprays into the nose daily.           Marland Kitchen RAMIPRIL 10 MG PO CAPS      TAKE 1 CAPSULE DAILY (DUE FOR APPOINTMENT BEFORE ADDITIONAL REFILLS)   30 capsule   4   . TADALAFIL 20 MG PO TABS   Oral   Take 0.5-1 tablets (10-20 mg total) by mouth every other day as needed for erectile dysfunction.   3 tablet   3     BP 141/91  Pulse 105  Temp 97.7 F (36.5 C) (Oral)  Resp 16  Ht 6\' 1"  (1.854 m)  Wt 300 lb 8 oz (136.306 kg)  BMI 39.65 kg/m2  SpO2 96%  Physical Exam  Nursing note and vitals reviewed. Constitutional: He is oriented to person, place, and time. He appears well-developed and well-nourished.  HENT:  Head: Normocephalic and atraumatic.  Right Ear: Tympanic membrane, external ear and ear canal normal.  Left Ear: Tympanic membrane, external ear and ear canal normal.  Nose: Mucosal edema and rhinorrhea present.  Mouth/Throat: Posterior oropharyngeal erythema present. No oropharyngeal exudate or posterior oropharyngeal  edema.  Eyes: No scleral icterus.  Neck: Neck supple.  Cardiovascular: Regular rhythm and normal heart sounds.   Pulmonary/Chest: Effort normal and breath sounds normal. No respiratory distress.  Neurological: He is alert and oriented to person, place, and time.  Skin: Skin is warm and dry.  Psychiatric: He has a normal mood and affect. His speech is normal.    ED Course  Procedures (including critical care time)  Labs Reviewed - No data to display No results found.   1. Acute upper respiratory infections of unspecified site   2. Sinusitis       MDM  1)  Take the prescribed antibiotic as instructed. 2)  Use nasal saline solution (over the counter) at least 3 times a day. 3)  Use over the counter decongestants like Zyrtec-D every 12 hours as needed to help with congestion.  If you have hypertension, do not take medicines with sudafed.  4)  Can take tylenol every 6 hours or motrin every 8 hours for pain or fever. 5)   Follow up with your primary doctor if no improvement in 5-7 days, sooner if increasing pain, fever, or new symptoms.         Marlaine Hind, MD 12/18/11 [id number]

## 2012-01-22 ENCOUNTER — Ambulatory Visit: Payer: Managed Care, Other (non HMO) | Admitting: Internal Medicine

## 2012-02-08 ENCOUNTER — Other Ambulatory Visit: Payer: Self-pay | Admitting: Internal Medicine

## 2012-02-09 NOTE — Telephone Encounter (Signed)
Refill done.  

## 2012-02-14 ENCOUNTER — Telehealth: Payer: Self-pay | Admitting: *Deleted

## 2012-02-14 NOTE — Telephone Encounter (Signed)
Pt left VM that he had some question about Rx for cialis..Left message to call office

## 2012-02-16 NOTE — Telephone Encounter (Signed)
Discussed with pt

## 2012-03-06 ENCOUNTER — Encounter: Payer: Self-pay | Admitting: Internal Medicine

## 2012-03-06 ENCOUNTER — Ambulatory Visit (INDEPENDENT_AMBULATORY_CARE_PROVIDER_SITE_OTHER): Payer: Managed Care, Other (non HMO) | Admitting: Internal Medicine

## 2012-03-06 VITALS — BP 146/86 | HR 100 | Temp 98.5°F | Ht 73.0 in | Wt 301.0 lb

## 2012-03-06 DIAGNOSIS — M109 Gout, unspecified: Secondary | ICD-10-CM

## 2012-03-06 DIAGNOSIS — I10 Essential (primary) hypertension: Secondary | ICD-10-CM

## 2012-03-06 LAB — BASIC METABOLIC PANEL
BUN: 8 mg/dL (ref 6–23)
CO2: 27 mEq/L (ref 19–32)
Calcium: 9.1 mg/dL (ref 8.4–10.5)
Chloride: 103 mEq/L (ref 96–112)
Creatinine, Ser: 1.1 mg/dL (ref 0.4–1.5)
GFR: 91.89 mL/min (ref >60.00)
Glucose, Bld: 96 mg/dL (ref 70–99)
Potassium: 3.8 mEq/L (ref 3.5–5.1)
Sodium: 137 mEq/L (ref 135–145)

## 2012-03-06 LAB — URIC ACID: Uric Acid, Serum: 7.2 mg/dL (ref 4.0–7.8)

## 2012-03-06 LAB — AST: AST: 24 U/L (ref 0–37)

## 2012-03-06 LAB — ALT: ALT: 22 U/L (ref 0–53)

## 2012-03-06 MED ORDER — RAMIPRIL 10 MG PO CAPS: 10.0000 mg | ORAL_CAPSULE | Freq: Every day | ORAL | Status: DC

## 2012-03-06 NOTE — Assessment & Plan Note (Addendum)
BP was slightly elevated today, I rechecked with a large cuff and obtain 140/95. Ambulatory BPs are normal Plan: Low-salt diet ! continue with exercise, continue same medications, continue checking ambulatory BPs and come back in 3 weeks for a nurse visit for a BP check. No charge.

## 2012-03-06 NOTE — Patient Instructions (Addendum)
Check the  blood pressure 2 or 3 times a week, be sure it is between 110/60 and 140/85. If it is consistently higher or lower, let me know. Come back in 3 weeks to see my nurse  And get a  BP check. Please call ahead. no charge Next visit with me in 4 months for a complete physical exam.

## 2012-03-06 NOTE — Assessment & Plan Note (Addendum)
Based on the recent elevated uric acid, I increased  allopurinol from 200 mg to 300 mg. He remains asymptomatic. Plan-- labs

## 2012-03-06 NOTE — Progress Notes (Signed)
  Subjective:    Patient ID: Kevin Baxter, male    DOB: 09-06-1970, 42 y.o.   MRN: [id number]  HPI Routine office visit, Gout,  Increased allopurinol to 300 mg, good compliance. No recent episodes of gout. Hypertension, good compliance of medication, he admits that his lifestyle has not been very healthy in the last 2 weeks (eating unhealthy, lots of salt,) BP today was elevated, see assessment and plan. He reports ambulatory BPs of 130/80.  Past Medical History  Diagnosis Date  . Hypertension   . Gout   . Allergy   . Wrist fracture, right    No past surgical history on file.   Review of Systems Denies chest pain or shortness of breath. No lower extremity edema He remains active and exercise regularly.     Objective:   Physical Exam General -- alert, well-developed Lungs -- normal respiratory effort, no intercostal retractions, no accessory muscle use, and normal breath sounds.   Heart-- normal rate, regular rhythm, no murmur, and no gallop.   Extremities-- no pretibial edema bilaterally Neurologic-- alert & oriented X3 and strength normal in all extremities. Psych-- Cognition and judgment appear intact. Alert and cooperative with normal attention span and concentration.  not anxious appearing and not depressed appearing.       Assessment & Plan:

## 2012-03-11 ENCOUNTER — Encounter: Payer: Self-pay | Admitting: *Deleted

## 2012-03-30 ENCOUNTER — Telehealth: Payer: Self-pay | Admitting: Internal Medicine

## 2012-03-30 NOTE — Telephone Encounter (Signed)
Needs a nurse visit for BP and pulse check, no charge

## 2012-04-01 NOTE — Telephone Encounter (Signed)
Discussed with pt, he states that he will come in on Thursday.

## 2012-07-04 ENCOUNTER — Encounter: Payer: Managed Care, Other (non HMO) | Admitting: Internal Medicine

## 2012-07-23 ENCOUNTER — Ambulatory Visit (INDEPENDENT_AMBULATORY_CARE_PROVIDER_SITE_OTHER): Payer: Managed Care, Other (non HMO) | Admitting: Internal Medicine

## 2012-07-23 VITALS — BP 134/88 | HR 92 | Temp 98.1°F | Wt 305.0 lb

## 2012-07-23 DIAGNOSIS — D179 Benign lipomatous neoplasm, unspecified: Secondary | ICD-10-CM

## 2012-07-23 NOTE — Progress Notes (Signed)
  Subjective:    Patient ID: Kevin Baxter, male    DOB: [birth year], 42 y.o.   MRN: [id number]  HPI Acute visit 2 weeks ago noted a lump at the right leg, the area is not tender, has not seen any discharge or redness. No injury.  Past Medical History  Diagnosis Date  . Hypertension   . Gout   . Allergy   . Wrist fracture, right    No past surgical history on file.    Review of Systems     Objective:   Physical Exam  Constitutional: He appears well-developed and well-nourished. No distress.  Genitourinary:  no inguinal lymphadenopathy on either side  Skin: He is not diaphoretic.          Assessment & Plan:  Lipoma? Recommend observation, will call if it gets larger, or if   any problems. Due for a checkup, recommend to schedule an office visit at his earliest convenience

## 2012-07-24 ENCOUNTER — Encounter: Payer: Self-pay | Admitting: Internal Medicine

## 2012-07-24 MED ORDER — COLCHICINE 0.6 MG PO TABS: ORAL_TABLET | ORAL | Status: DC

## 2012-09-20 ENCOUNTER — Other Ambulatory Visit: Payer: Self-pay | Admitting: Internal Medicine

## 2012-09-20 NOTE — Telephone Encounter (Signed)
Refill done per protocol.  

## 2012-10-17 ENCOUNTER — Encounter: Payer: Self-pay | Admitting: Internal Medicine

## 2012-10-20 ENCOUNTER — Other Ambulatory Visit: Payer: Self-pay | Admitting: Internal Medicine

## 2012-10-21 NOTE — Telephone Encounter (Signed)
rx refilled per protocol. DJR  

## 2013-01-01 ENCOUNTER — Other Ambulatory Visit: Payer: Self-pay | Admitting: Internal Medicine

## 2013-01-02 NOTE — Telephone Encounter (Signed)
Cialis refilled per protocol

## 2013-01-14 ENCOUNTER — Telehealth: Payer: Self-pay

## 2013-01-14 NOTE — Telephone Encounter (Addendum)
  Medication List and allergies:  Reviewed and updated  90 day supply/mail order: na Local [facility] [address]  Immunizations due: declines flu vaccine  A/P:   No changes to FH or PSH or personal hx Tdap--04/2008 PSA--none noted  Pt is [place], declined a DRE today,we agreed will start prostate ca and CRC screening next year (2012 notes)  To Discuss with Provider: Not at this time

## 2013-01-15 ENCOUNTER — Ambulatory Visit (INDEPENDENT_AMBULATORY_CARE_PROVIDER_SITE_OTHER): Payer: Managed Care, Other (non HMO) | Admitting: Internal Medicine

## 2013-01-15 ENCOUNTER — Encounter: Payer: Self-pay | Admitting: Internal Medicine

## 2013-01-15 VITALS — BP 139/89 | HR 85 | Temp 98.2°F | Ht 73.0 in | Wt 313.0 lb

## 2013-01-15 DIAGNOSIS — Z0000 Encounter for general adult medical examination without abnormal findings: Secondary | ICD-10-CM

## 2013-01-15 DIAGNOSIS — I10 Essential (primary) hypertension: Secondary | ICD-10-CM

## 2013-01-15 DIAGNOSIS — M109 Gout, unspecified: Secondary | ICD-10-CM

## 2013-01-15 LAB — CBC WITH DIFFERENTIAL/PLATELET
Basophils Absolute: 0 10*3/uL (ref 0.0–0.1)
Basophils Relative: 0.4 % (ref 0.0–3.0)
Eosinophils Absolute: 0.2 10*3/uL (ref 0.0–0.7)
Eosinophils Relative: 5.1 % — ABNORMAL HIGH (ref 0.0–5.0)
HCT: 51.3 % (ref 39.0–52.0)
Hemoglobin: 16.9 g/dL (ref 13.0–17.0)
Lymphocytes Relative: 31.7 % (ref 12.0–46.0)
Lymphs Abs: 1.4 10*3/uL (ref 0.7–4.0)
MCHC: 33 g/dL (ref 30.0–36.0)
MCV: 76.8 fl — ABNORMAL LOW (ref 78.0–100.0)
Monocytes Absolute: 0.3 10*3/uL (ref 0.1–1.0)
Monocytes Relative: 7.6 % (ref 3.0–12.0)
Neutro Abs: 2.5 10*3/uL (ref 1.4–7.7)
Neutrophils Relative %: 55.2 % (ref 43.0–77.0)
Platelets: 226 10*3/uL (ref 150.0–400.0)
RBC: 6.68 Mil/uL — ABNORMAL HIGH (ref 4.22–5.81)
RDW: 15.9 % — ABNORMAL HIGH (ref 11.5–14.6)
WBC: 4.5 10*3/uL (ref 4.5–10.5)

## 2013-01-15 LAB — COMPREHENSIVE METABOLIC PANEL
ALT: 19 U/L (ref 0–53)
AST: 16 U/L (ref 0–37)
Albumin: 4.2 g/dL (ref 3.5–5.2)
Alkaline Phosphatase: 55 U/L (ref 39–117)
BUN: 10 mg/dL (ref 6–23)
CO2: 26 mEq/L (ref 19–32)
Calcium: 9.2 mg/dL (ref 8.4–10.5)
Chloride: 103 mEq/L (ref 96–112)
Creatinine, Ser: 1.1 mg/dL (ref 0.4–1.5)
GFR: 95.44 mL/min (ref >60.00)
Glucose, Bld: 109 mg/dL — ABNORMAL HIGH (ref 70–99)
Potassium: 4.1 mEq/L (ref 3.5–5.1)
Sodium: 137 mEq/L (ref 135–145)
Total Bilirubin: 0.9 mg/dL (ref 0.3–1.2)
Total Protein: 7.3 g/dL (ref 6.0–8.3)

## 2013-01-15 LAB — LIPID PANEL
Cholesterol: 164 mg/dL (ref 0–200)
HDL: 41.5 mg/dL (ref >39.00)
LDL Cholesterol: 103 mg/dL — ABNORMAL HIGH (ref 0–99)
Total CHOL/HDL Ratio: 4
Triglycerides: 100 mg/dL (ref 0.0–149.0)
VLDL: 20 mg/dL (ref 0.0–40.0)

## 2013-01-15 MED ORDER — LOSARTAN POTASSIUM 100 MG PO TABS: 100.0000 mg | ORAL_TABLET | Freq: Every day | ORAL | Status: DC
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
## 2013-01-15 NOTE — Assessment & Plan Note (Signed)
Asymptomatic as long as he takes his medication

## 2013-01-15 NOTE — Assessment & Plan Note (Addendum)
Td 4-10  Declined flu shot Current guidelines call for  less intense  Prostate-colon cancer screening, reassess issue next year. He is asymptomatic Diet , exercise Discussed, it is extremely important he eats healthier, see instructions  Labs EKG nsr

## 2013-01-15 NOTE — Progress Notes (Signed)
Pre visit review using our clinic review tool, if applicable. No additional management support is needed unless otherwise documented below in the visit note. 

## 2013-01-15 NOTE — Patient Instructions (Addendum)
Get your blood work before you leave   Stop ramipril, start losartan for BPs Check the  blood pressure 2 or 3 times a  week be sure it is between 110/60 and 140/85. Ideal blood pressure is 120/80. If it is consistently higher or lower, let me know  Schedule labs to be done in 3 weeks from today: BMP hypertension  Next visit for a  follow up  regards   hypertension   , no fasting, in 6 months, sooner if your BP is not well controlled  Please make an appointment       If you need more information diet, exercise and  hypertension, the American Heart Association is a great resource online at:  [employer]on101.pl    Testicular Self-Exam A self-examination of your testicles involves looking at and feeling your testicles for abnormal lumps or swelling. Several things can cause swelling, lumps, or pain in your testicles. Some of these causes are:  Injuries.  Inflammation.  Infection.  Accumulation of fluids around your testicle (hydrocele).  Twisted testicles (testicular torsion).  Testicular cancer. Self-examination of the testicles and groin areas may be advised if you are at risk for testicular cancer. Risks for testicular cancer include:  An undescended testicle (cryptorchidism).  A history of previous testicular cancer.  A family history of testicular cancer. The testicles are easiest to examine after warm baths or showers and are more difficult to examine when you are cold. This is because the muscles attached to the testicles retract and pull them up higher or into the abdomen. Follow these steps while you are standing:  Hold your penis away from your body.  Roll one testicle between your thumb and forefinger, feeling the entire testicle.  Roll the other testicle between your thumb and forefinger, feeling the entire testicle. Feel for lumps, swelling, or discomfort. A normal testicle is egg shaped and feels firm. It is smooth and not tender. The spermatic cord  can be felt as a firm spaghetti-like cord at the back of your testicle. It is also important to examine the crease between the front of your leg and your abdomen. Feel for any bumps that are tender. These could be enlarged lymph nodes.  Document Released: 04/24/2000 Document Revised: 09/18/2012 Document Reviewed: 07/08/2012 [facility] Patient Information 2014 [place], [place].

## 2013-01-15 NOTE — Assessment & Plan Note (Addendum)
Ambulatory BPs range from the 130s, 140s, diastolic BP 80-90. BP goal 120/80. Plan: Switch altace to losartan 100 mg, see instructions

## 2013-01-17 ENCOUNTER — Ambulatory Visit: Payer: Managed Care, Other (non HMO)

## 2013-01-17 DIAGNOSIS — R7309 Other abnormal glucose: Secondary | ICD-10-CM

## 2013-01-17 LAB — HEMOGLOBIN A1C: Hgb A1c MFr Bld: 6.2 % (ref 4.6–6.5)

## 2013-02-26 ENCOUNTER — Telehealth: Payer: Self-pay | Admitting: Internal Medicine

## 2013-02-26 DIAGNOSIS — I10 Essential (primary) hypertension: Secondary | ICD-10-CM

## 2013-02-26 NOTE — Telephone Encounter (Signed)
Advise patient, we change his BP medication last month, due for a BMP DX hypertension, please arrange

## 2013-02-27 NOTE — Telephone Encounter (Signed)
Pt to call back to schedule

## 2013-02-27 NOTE — Addendum Note (Signed)
Addended by: Peggyann Shoals on: 02/27/2013 09:24 AM   Modules accepted: Orders

## 2013-03-08 ENCOUNTER — Emergency Department (HOSPITAL_COMMUNITY)
Admission: EM | Admit: 2013-03-08 | Discharge: 2013-03-08 | Disposition: A | Discharge status: Home / Self Care | Payer: Managed Care, Other (non HMO) | Attending: Emergency Medicine | Admitting: Emergency Medicine

## 2013-03-08 ENCOUNTER — Emergency Department (HOSPITAL_COMMUNITY): Payer: Managed Care, Other (non HMO)

## 2013-03-08 ENCOUNTER — Encounter (HOSPITAL_COMMUNITY): Payer: Self-pay | Admitting: Emergency Medicine

## 2013-03-08 DIAGNOSIS — Z79899 Other long term (current) drug therapy: Secondary | ICD-10-CM | POA: Insufficient documentation

## 2013-03-08 DIAGNOSIS — I10 Essential (primary) hypertension: Secondary | ICD-10-CM | POA: Insufficient documentation

## 2013-03-08 DIAGNOSIS — Z23 Encounter for immunization: Secondary | ICD-10-CM | POA: Insufficient documentation

## 2013-03-08 DIAGNOSIS — M109 Gout, unspecified: Secondary | ICD-10-CM | POA: Insufficient documentation

## 2013-03-08 DIAGNOSIS — Y92410 Unspecified street and highway as the place of occurrence of the external cause: Secondary | ICD-10-CM | POA: Insufficient documentation

## 2013-03-08 DIAGNOSIS — R404 Transient alteration of awareness: Secondary | ICD-10-CM | POA: Insufficient documentation

## 2013-03-08 DIAGNOSIS — IMO0002 Reserved for concepts with insufficient information to code with codable children: Secondary | ICD-10-CM | POA: Insufficient documentation

## 2013-03-08 DIAGNOSIS — Y9389 Activity, other specified: Secondary | ICD-10-CM | POA: Insufficient documentation

## 2013-03-08 DIAGNOSIS — S52133A Displaced fracture of neck of unspecified radius, initial encounter for closed fracture: Secondary | ICD-10-CM
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
## 2013-03-08 IMAGING — CR DG ELBOW COMPLETE 3+V*R*
4 series · 4 of 4 positions shown · non-contrast
Comparison: None.

CLINICAL DATA: 42-year-old male with elbow pain following injury.

EXAM:
RIGHT ELBOW - COMPLETE 3+ VIEW

[x elbow joint ap right (1 of 3)]
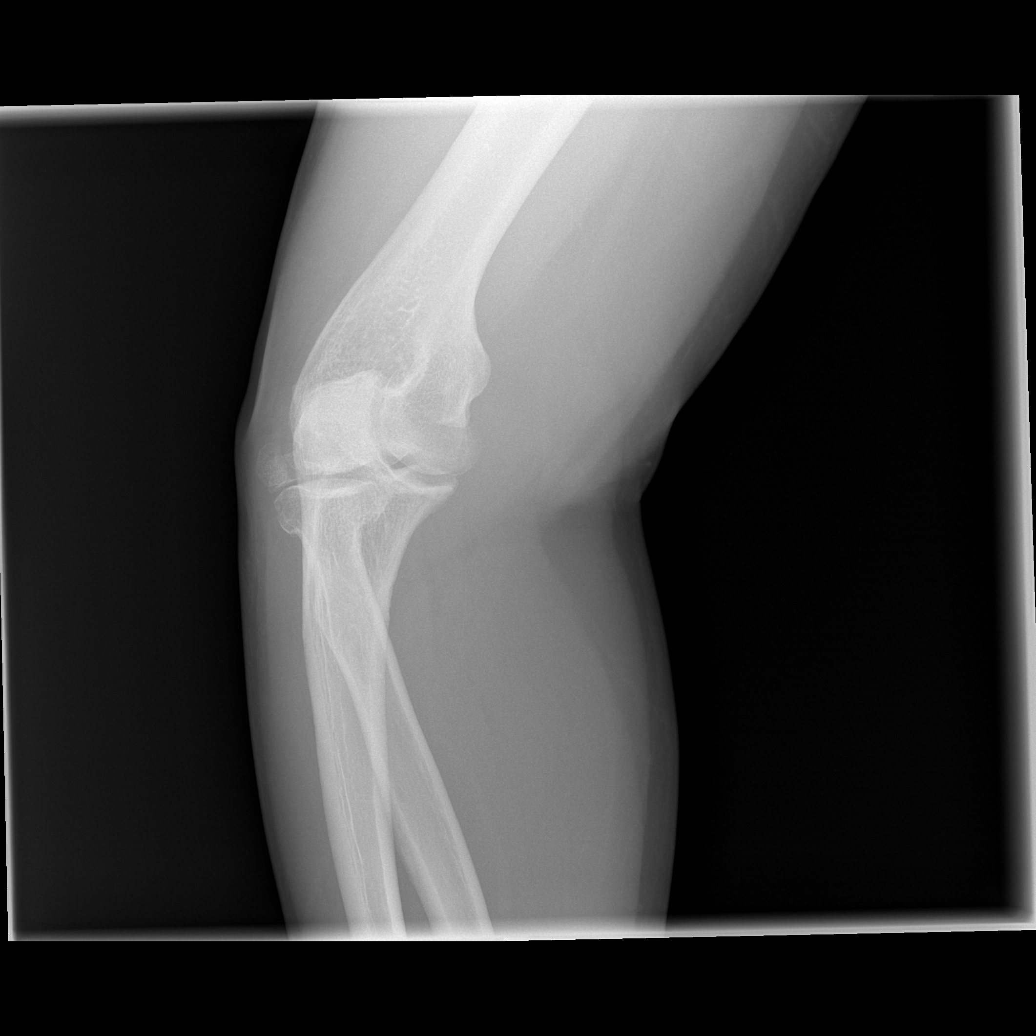

[x elbow joint lat right]
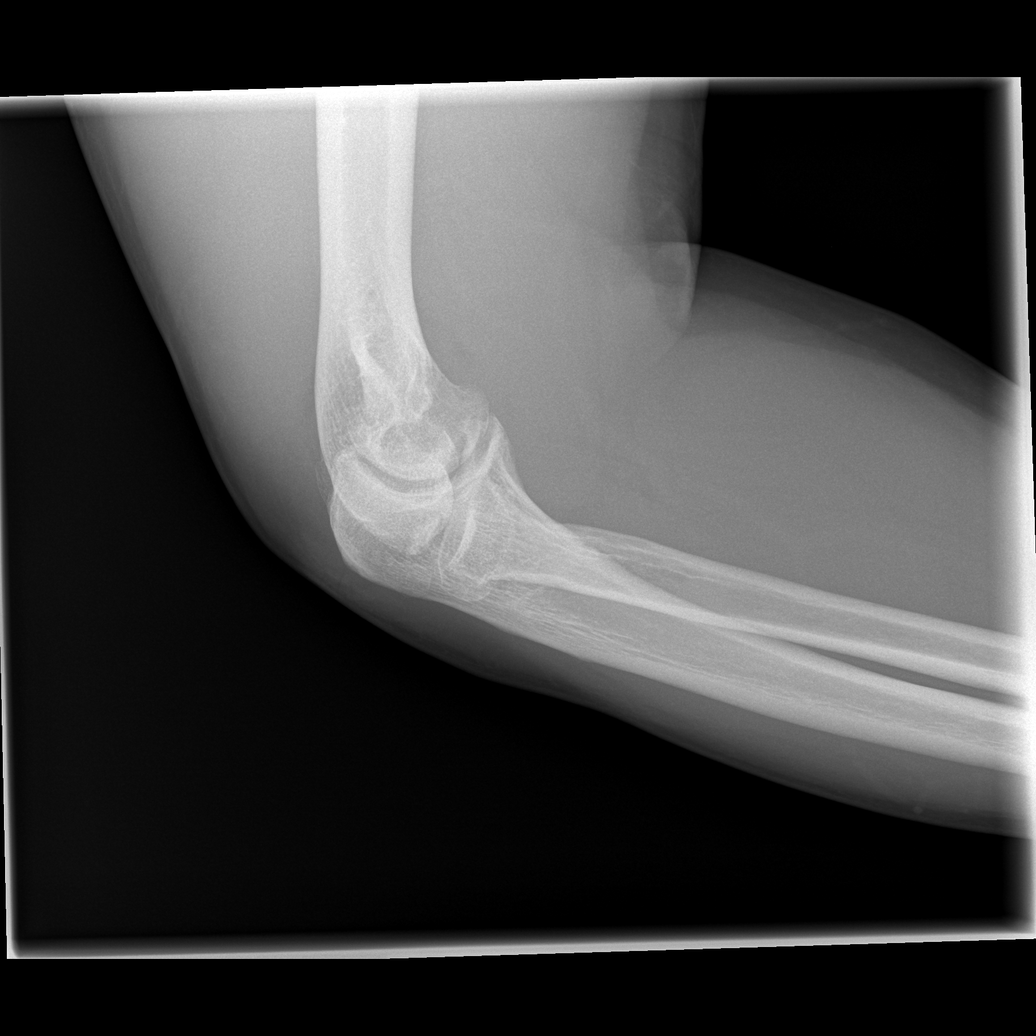

[x elbow joint ap right (2 of 3)]
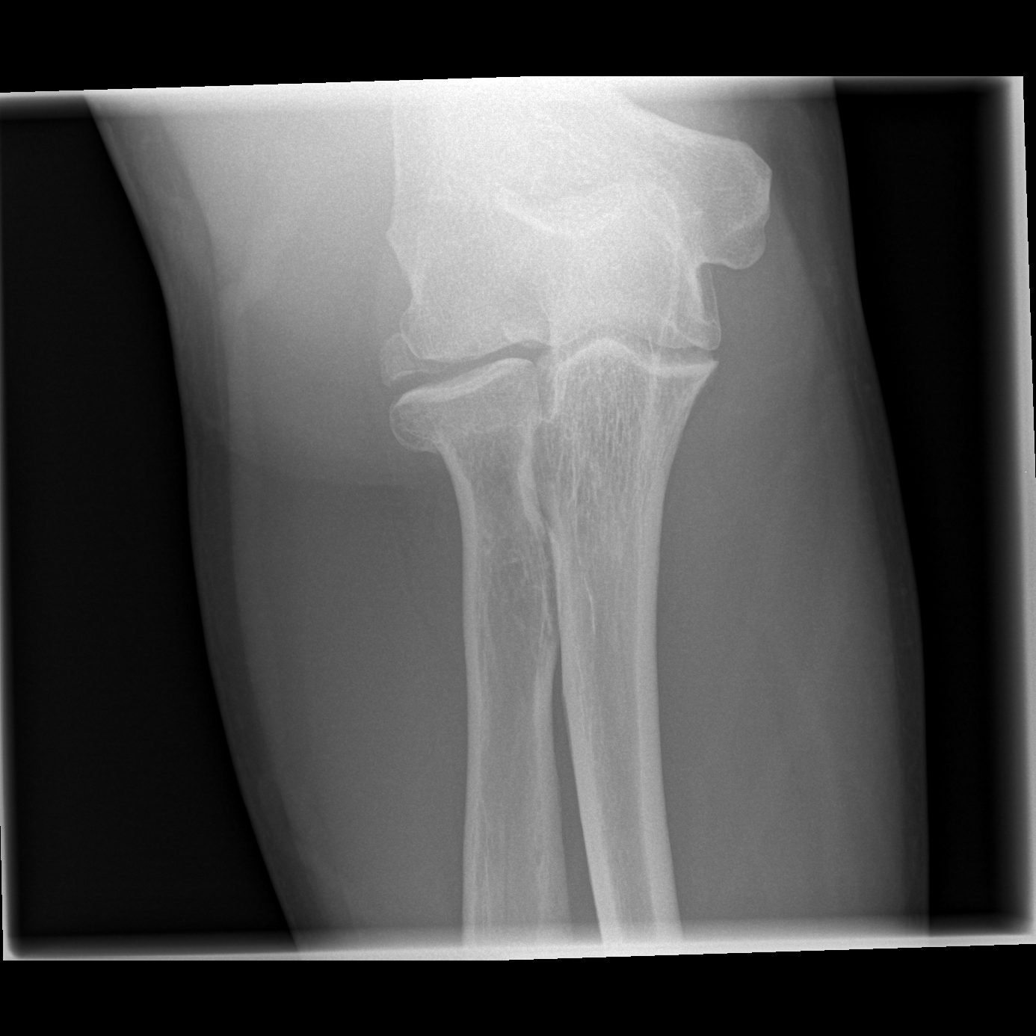

[x elbow joint ap right (3 of 3)]
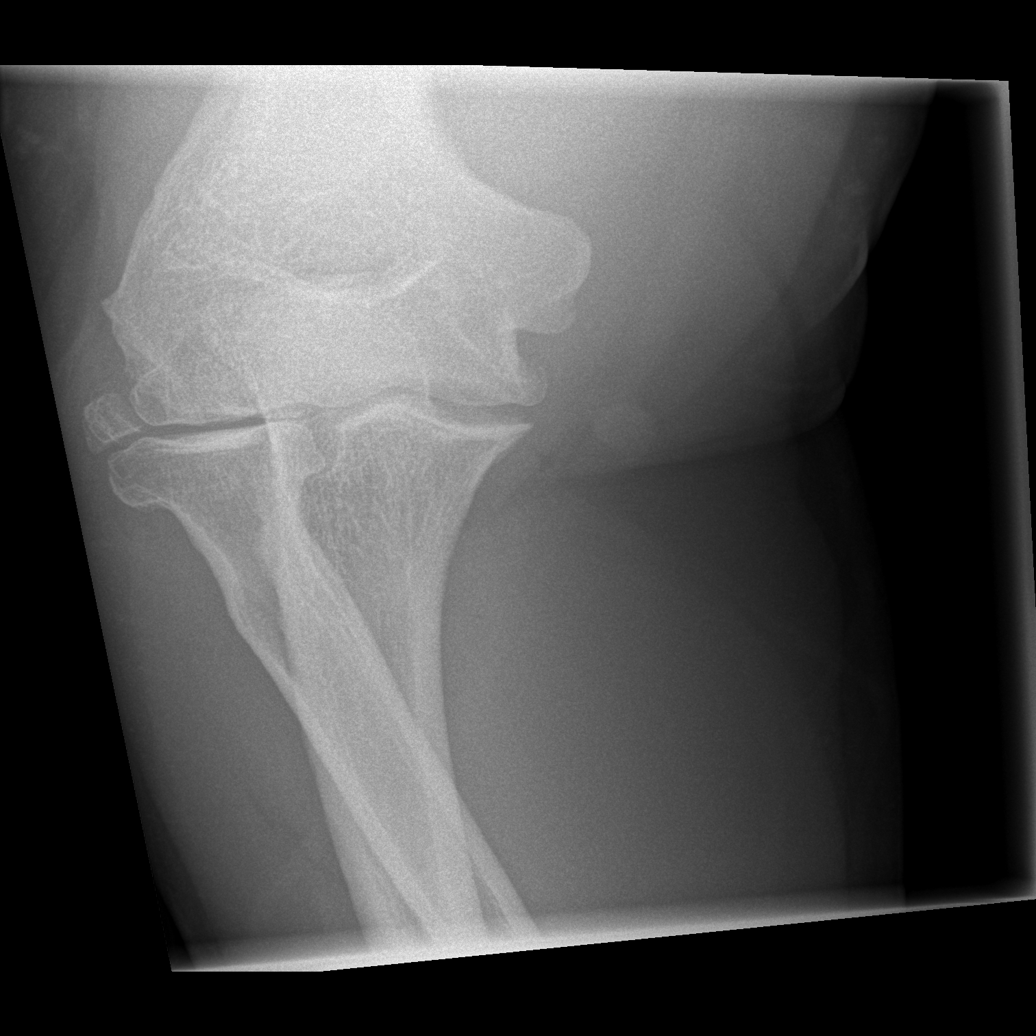

[4 of 4 positions shown; findings below may reference images not displayed]

FINDINGS: A radial neck fracture is present with elbow effusion.

There is no evidence of subluxation or dislocation.

A bony density along the lateral humeral condyle appears remote.

Mild degenerative changes at the humeral ulnar joint noted.
IMPRESSION: Radial neck fracture with elbow effusion.

Bony density along the lateral humeral condyle appears remote
-question prior injury.

## 2013-03-08 MED ORDER — HYDROCODONE-ACETAMINOPHEN 7.5-325 MG PO TABS
1.0000 | ORAL_TABLET | Freq: Four times a day (QID) | ORAL | Status: DC | PRN
  Administered 2013-03-08: 1 via ORAL
  Filled 2013-03-08: qty 1

## 2013-03-08 MED ORDER — HYDROCODONE-ACETAMINOPHEN 10-325 MG PO TABS: 1.0000 | ORAL_TABLET | Freq: Four times a day (QID) | ORAL | Status: DC | PRN

## 2013-03-08 MED ORDER — HYDROCODONE-ACETAMINOPHEN 5-325 MG PO TABS
1.0000 | ORAL_TABLET | Freq: Once | ORAL | Status: AC
  Administered 2013-03-08: 1 via ORAL
  Filled 2013-03-08: qty 1

## 2013-03-08 MED ORDER — TETANUS-DIPHTH-ACELL PERTUSSIS 5-2.5-18.5 LF-MCG/0.5 IM SUSP
0.5000 mL | Freq: Once | INTRAMUSCULAR | Status: AC
  Administered 2013-03-08: 0.5 mL via INTRAMUSCULAR
  Filled 2013-03-08: qty 0.5

## 2013-03-08 MED ORDER — HYDROCODONE-ACETAMINOPHEN 7.5-325 MG/15ML PO SOLN: 10.0000 mL | Freq: Once | ORAL | Status: DC
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
## 2013-03-08 NOTE — Discharge Instructions (Signed)
Keep arm in splint until Monday afternoon. Then take off splint. Move your elbow several times per day. Make an appointment with an orthopedic surgeon on Monday. Take Motrin or Aleve for pain. You can also take Norco but do not drive or work while taking this medication.   Elbow Fracture, Simple A fracture is a break in one of the bones.When fractures are not displaced or separated, they may be treated with only a sling or splint. The sling or splint may only be required for two to three weeks. In these cases, often the elbow is put through early range of motion exercises to prevent the elbow from getting stiff. DIAGNOSIS  The diagnosis (learning what is wrong) of a fractured elbow is made by x-ray. These may be required before and after the elbow is put into a splint or cast. X-rays are taken after to make sure the bone pieces have not moved. HOME CARE INSTRUCTIONS   Only take over-the-counter or prescription medicines for pain, discomfort, or fever as directed by your caregiver.  If you have a splint held on with an elastic wrap and your hand or fingers become numb or cold and blue, loosen the wrap and reapply more loosely. See your caregiver if there is no relief.  You may use ice for twenty minutes, four times per day, for the first two to three days.  Use your elbow as directed.  See your caregiver as directed. It is very important to keep all follow-up referrals and appointments in order to avoid any long-term problems with your elbow including chronic pain or stiffness. SEEK IMMEDIATE MEDICAL CARE IF:   There is swelling or increasing pain in elbow.  You begin to lose feeling or experience numbness or tingling in your hand or fingers.  You develop swelling of the hand and fingers.  You get a cold or blue hand or fingers on affected side. MAKE SURE YOU:   Understand these instructions.  Will watch your condition.  Will get help right away if you are not doing well or get  worse. Document Released: 01/10/2001 Document Revised: 04/10/2011 Document Reviewed: 12/01/2008 [facility] Patient Information 2014 [place], [place].

## 2013-03-08 NOTE — ED Notes (Signed)
Patient transported to X-ray 

## 2013-03-08 NOTE — Progress Notes (Signed)
Orthopedic Tech Progress Note Patient Details:  Kevin Baxter Jun 17, 1970 [id number]  Ortho Devices Type of Ortho Device: Ace wrap;Arm sling;Post (long arm) splint Ortho Device/Splint Location: RUE Ortho Device/Splint Interventions: Ordered;Application   Braulio Bosch 03/08/2013, 11:07 PM
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
## 2013-03-12 ENCOUNTER — Encounter: Payer: Self-pay | Admitting: Internal Medicine

## 2013-03-12 ENCOUNTER — Ambulatory Visit (INDEPENDENT_AMBULATORY_CARE_PROVIDER_SITE_OTHER): Payer: Managed Care, Other (non HMO) | Admitting: Internal Medicine

## 2013-03-12 VITALS — BP 100/66 | HR 90 | Temp 98.0°F | Wt 310.0 lb

## 2013-03-12 DIAGNOSIS — I10 Essential (primary) hypertension: Secondary | ICD-10-CM

## 2013-03-12 LAB — BASIC METABOLIC PANEL
BUN: 14 mg/dL (ref 6–23)
CO2: 30 mEq/L (ref 19–32)
Calcium: 9 mg/dL (ref 8.4–10.5)
Chloride: 104 mEq/L (ref 96–112)
Creatinine, Ser: 1.2 mg/dL (ref 0.4–1.5)
GFR: 86.11 mL/min (ref >60.00)
Glucose, Bld: 88 mg/dL (ref 70–99)
Potassium: 4.3 mEq/L (ref 3.5–5.1)
Sodium: 140 mEq/L (ref 135–145)

## 2013-03-12 NOTE — Progress Notes (Signed)
Pre visit review using our clinic review tool, if applicable. No additional management support is needed unless otherwise documented below in the visit note. 

## 2013-03-12 NOTE — Patient Instructions (Addendum)
Get your blood work before you leave   Next visit is for routine check up regards your blood pressure  in 3 months  No need to come back fasting Please make an appointment    Check the  blood pressure weekly be sure it is between 110/60 and 140/85. Ideal blood pressure is 120/80. If it is consistently higher or lower, let me know
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
## 2013-03-13 ENCOUNTER — Telehealth: Payer: Self-pay | Admitting: Internal Medicine

## 2013-03-13 ENCOUNTER — Encounter: Payer: Self-pay | Admitting: Internal Medicine

## 2013-03-13 NOTE — Telephone Encounter (Signed)
Relevant patient education mailed to patient.  

## 2013-03-14 ENCOUNTER — Encounter: Payer: Self-pay | Admitting: *Deleted

## 2013-03-26 ENCOUNTER — Other Ambulatory Visit: Payer: Self-pay | Admitting: Internal Medicine

## 2013-07-22 ENCOUNTER — Telehealth: Payer: Self-pay | Admitting: Internal Medicine

## 2013-07-22 MED ORDER — COLCHICINE 0.6 MG PO TABS: 0.6000 mg | ORAL_TABLET | Freq: Two times a day (BID) | ORAL | Status: DC | PRN

## 2013-07-22 MED ORDER — PREDNISONE 10 MG PO TABS: ORAL_TABLET | ORAL | Status: DC

## 2013-07-22 NOTE — Telephone Encounter (Signed)
History of slightly elevated blood sugar, we need to be careful with the steroids. Plan: Colchicine 0.6 mg one by mouth twice a day when necessary   Prednisone Advise patient I sent the prescriptions, Schedule a checkup, he is due.

## 2013-07-22 NOTE — Telephone Encounter (Signed)
Pt.notified

## 2013-07-22 NOTE — Telephone Encounter (Signed)
Caller name: Kincaid Tiger Relation to pt: patient Call back number:[phone] Pharmacy: [id number] - [place], [place]   Reason for call: patient called stating that his gout is flaring up again. Patient is taking his allopurinol but still is in a lot of pain. Patient is requesting rx for Prednisone. Please advise.

## 2013-07-27 ENCOUNTER — Other Ambulatory Visit: Payer: Self-pay | Admitting: Internal Medicine

## 2013-09-21 ENCOUNTER — Other Ambulatory Visit: Payer: Self-pay | Admitting: Internal Medicine
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
## 2013-11-17 ENCOUNTER — Other Ambulatory Visit: Payer: Self-pay

## 2013-11-17 MED ORDER — LOSARTAN POTASSIUM 100 MG PO TABS: ORAL_TABLET | ORAL | Status: DC

## 2014-02-06 ENCOUNTER — Other Ambulatory Visit: Payer: Self-pay | Admitting: Internal Medicine

## 2014-02-06 NOTE — Telephone Encounter (Signed)
Patient is overdue for a visit, please arrange Okay Cialis #3 and one refill.

## 2014-02-06 NOTE — Telephone Encounter (Signed)
Pt is requesting refill on Cialis.  Last OV: 03/12/2013 Last Fill: 01/01/2013 # [id number]   Okay to refill?

## 2014-02-25 ENCOUNTER — Telehealth: Payer: Self-pay | Admitting: *Deleted

## 2014-02-25 NOTE — Telephone Encounter (Signed)
Prior authorization for Cialis denied because patient's insurance is inactive. Called and unable to leave message for patient. Faxed pharmacy that PA denied because insurance is inactive. JG//CMA

## 2014-03-11 ENCOUNTER — Encounter: Payer: Self-pay | Admitting: Internal Medicine

## 2014-03-11 ENCOUNTER — Ambulatory Visit (INDEPENDENT_AMBULATORY_CARE_PROVIDER_SITE_OTHER): Payer: Managed Care, Other (non HMO) | Admitting: Internal Medicine

## 2014-03-11 VITALS — BP 138/92 | HR 91 | Temp 98.0°F | Ht 73.0 in | Wt 323.0 lb

## 2014-03-11 DIAGNOSIS — R7303 Prediabetes: Secondary | ICD-10-CM

## 2014-03-11 DIAGNOSIS — R7309 Other abnormal glucose: Secondary | ICD-10-CM

## 2014-03-11 DIAGNOSIS — M109 Gout, unspecified: Secondary | ICD-10-CM

## 2014-03-11 DIAGNOSIS — R739 Hyperglycemia, unspecified: Secondary | ICD-10-CM | POA: Insufficient documentation

## 2014-03-11 DIAGNOSIS — I10 Essential (primary) hypertension: Secondary | ICD-10-CM

## 2014-03-11 DIAGNOSIS — N529 Male erectile dysfunction, unspecified: Secondary | ICD-10-CM

## 2014-03-11 MED ORDER — COLCHICINE 0.6 MG PO TABS: 0.6000 mg | ORAL_TABLET | Freq: Two times a day (BID) | ORAL | Status: DC | PRN

## 2014-03-11 MED ORDER — TADALAFIL 20 MG PO TABS
ORAL_TABLET | ORAL | Status: DC
Start: 2014-03-11 — End: 2014-09-09

## 2014-03-11 MED ORDER — LOSARTAN POTASSIUM 50 MG PO TABS: 50.0000 mg | ORAL_TABLET | Freq: Every day | ORAL | Status: DC

## 2014-03-11 MED ORDER — ALLOPURINOL 300 MG PO TABS: 300.0000 mg | ORAL_TABLET | Freq: Every day | ORAL | Status: DC

## 2014-03-11 NOTE — Assessment & Plan Note (Signed)
Good compliance with allopurinol, no recent episodes, refill meds

## 2014-03-11 NOTE — Patient Instructions (Signed)
Get your blood work before you leave    Check the  blood pressure   weekly  Be sure your blood pressure is between 110/65 and  145/85.  if it is consistently higher or lower, let me know      If you need more information about a healthy diet,   visit  the American Heart Association, it  is a great resource online at:  http://www.richard-flynn.net/    Please come back to the office in 3 months  for a physical exam. Come back fasting

## 2014-03-11 NOTE — Progress Notes (Signed)
Pre visit review using our clinic review tool, if applicable. No additional management support is needed unless otherwise documented below in the visit note. 

## 2014-03-11 NOTE — Assessment & Plan Note (Addendum)
Good compliance w/ losartan 100 mg, he only takes 50 mg daily, ambulatory BPs okay mostly the time, occasionally 140/90. Plan: Labs Refill losartan Diet, exercise, recheck BP when he comes back in 3 months for a physical

## 2014-03-11 NOTE — Assessment & Plan Note (Signed)
A1c in 2014 was 6.2. The patient has gained weight, admits to drinking regular sodas (many a day). We had extensive discussion about diet, exercise and losing weight. We'll recheck the A1c, he seems to be determined to work on his lifestyle.

## 2014-03-11 NOTE — Assessment & Plan Note (Signed)
Refill Cialis.

## 2014-03-11 NOTE — Progress Notes (Signed)
Subjective:    Patient ID: Kevin Baxter, male    DOB: March 21, 1970, 44 y.o.   MRN: [id number]  DOS:  03/11/2014 Type of visit - description : rov Interval history: Hypertension, good compliance with medication, ambulatory BPs usually 130/80, occasionally 140/90. Gout, good compliance w/ medication, needs a refill. No recent attacks Prediabetes, admits that he has gained a significant amount of weight lately, change jobs, adjusting to the new schedule. Has been inactive.   Review of Systems  Denies chest pain or difficulty breathing. No lower extremity edema No nausea, vomiting, diarrhea  Past Medical History  Diagnosis Date  . Hypertension   . Gout   . Allergy     Past Surgical History  Procedure Laterality Date  . Hand surgery Right     2006 for a FX    History   Social History  . Marital Status: Married    Spouse Name: N/A  . Number of Children: 2  . Years of Education: N/A   Occupational History  . works for [employer]  . Smoking status: Never Smoker   . Smokeless tobacco: Never Used  . Alcohol Use: Yes     Comment: very rare   . Drug Use: No  . Sexual Activity: Not on file   Other Topics Concern  . Not on file   Social History Narrative        Medication List       This list is accurate as of: 03/11/14 11:59 PM.  Always use your most recent med list.               allopurinol 300 MG tablet  Commonly known as:  ZYLOPRIM  Take 1 tablet (300 mg total) by mouth daily.     colchicine 0.6 MG tablet  Take 1 tablet (0.6 mg total) by mouth 2 (two) times daily as needed.     losartan 50 MG tablet  Commonly known as:  COZAAR  Take 1 tablet (50 mg total) by mouth daily.     tadalafil 20 MG tablet  Commonly known as:  CIALIS  Take 1/2 to 1 tablet every other day as needed.           Objective:   Physical Exam  Constitutional: He is oriented to person, place, and time. He appears well-developed.  No distress.  HENT:  Head: Normocephalic and atraumatic.  Cardiovascular:  RRR, no murmur, rub or gallop  Pulmonary/Chest: Effort normal. No respiratory distress.  CTA B  Musculoskeletal: Normal range of motion. He exhibits no edema or tenderness.  Neurological: He is alert and oriented to person, place, and time. No cranial nerve deficit. He exhibits normal muscle tone. Coordination normal.  Speech normal, gait unassisted and normal for age, motor strength appropriate for age   Skin: Skin is warm and dry. No pallor.  Psychiatric: He has a normal mood and affect. His behavior is normal. Judgment and thought content normal.  Vitals reviewed.       Assessment & Plan:   Problem List Items Addressed This Visit      Cardiovascular and Mediastinum   Essential hypertension    Good compliance would losartan 100 mg, he only takes 50 mg daily, ambulatory BPs okay mostly the time, occasionally 140/90. Plan: Labs Refill losartan Diet, exercise, recheck BP when he comes back in 3 months for a physical      Relevant Medications   tadalafil (CIALIS) tablet  losartan (COZAAR) tablet   Other Relevant Orders   Basic metabolic panel (Completed)     Endocrine   Prediabetes    A1c in 2014 was 6.2. The patient has gained weight, admits to drinking regular sodas (many a day). We had extensive discussion about diet, exercise and losing weight. We'll recheck the A1c, he seems to be determined to work on his lifestyle.      Relevant Orders   Hemoglobin A1c (Completed)

## 2014-03-12 ENCOUNTER — Telehealth: Payer: Self-pay | Admitting: Internal Medicine

## 2014-03-12 LAB — BASIC METABOLIC PANEL
BUN: 12 mg/dL (ref 6–23)
CO2: 30 mEq/L (ref 19–32)
Calcium: 9.6 mg/dL (ref 8.4–10.5)
Chloride: 102 mEq/L (ref 96–112)
Creatinine, Ser: 1.12 mg/dL (ref 0.40–1.50)
GFR: 91.04 mL/min (ref >60.00)
Glucose, Bld: 84 mg/dL (ref 70–99)
Potassium: 4.7 mEq/L (ref 3.5–5.1)
Sodium: 139 mEq/L (ref 135–145)

## 2014-03-12 LAB — HEMOGLOBIN A1C: Hgb A1c MFr Bld: 6.4 % (ref 4.6–6.5)

## 2014-03-12 NOTE — Telephone Encounter (Signed)
Called patient to get current prescription insurance coverage information. PA initiated.   [facility]  [id number]  [phone]  Rx Bin# [id number]  Rx PCN: [id number]  Rx Group: [id number]

## 2014-03-12 NOTE — Telephone Encounter (Signed)
Caller name: CELSO, GRANJA Relation to pt: self  Call back number: [phone] [facility]acy[id number] - [place], [place] [phone] (Phone) [phone] (Fax)         Reason for call:  Pt requesting a refill of tadalafil (CIALIS) 20 MG tablet in need of prio-auth   [phone] [facility]

## 2014-03-16 NOTE — Telephone Encounter (Signed)
Caller name: KAVARI, PARRILLO Relation to pt: self  Call back number: [phone]   Reason for call:  Pt is checking on the status of prior-auth

## 2014-03-18 NOTE — Telephone Encounter (Signed)
Called and left message for patient to please return call. JG//CMA

## 2014-03-18 NOTE — Telephone Encounter (Signed)
PA for Cialis denied. Covered alternative is Viagra. Please advise. JG//CMA

## 2014-03-18 NOTE — Telephone Encounter (Signed)
Patient returned phone call. [facility] # [phone]  Patient states that he will try viagra.

## 2014-03-18 NOTE — Telephone Encounter (Signed)
If patient okay with it, Viagra 100 mg, half or one tablet daily prn #10 and 6 refills

## 2014-03-19 MED ORDER — SILDENAFIL CITRATE 100 MG PO TABS: 50.0000 mg | ORAL_TABLET | Freq: Every day | ORAL | Status: DC | PRN

## 2014-03-19 NOTE — Telephone Encounter (Signed)
[facility] [place]. JG//CMA

## 2014-05-06 ENCOUNTER — Other Ambulatory Visit: Payer: Self-pay

## 2014-05-21 ENCOUNTER — Telehealth: Payer: Self-pay

## 2014-05-21 NOTE — Telephone Encounter (Signed)
Pt's wife came into office today (05/21/2014) for a routine OV with Dr. Larose Kells. She brought with her, the Pt's Wellness Screening Form for Dr. Larose Kells to complete. Informed Pt that we would complete form and fax to number listed on form [phone]) and mail back the original copy. Pt's wife verbalized understanding.

## 2014-05-21 NOTE — Telephone Encounter (Signed)
Form completed, and signed by Dr. Larose Kells and faxed to [facility] and original sent for scanning. Copies made to be mailed back to Pt.

## 2014-05-26 ENCOUNTER — Telehealth: Payer: Self-pay | Admitting: Internal Medicine

## 2014-05-26 NOTE — Telephone Encounter (Signed)
Pre visit letter for annual physical mailed

## 2014-06-15 ENCOUNTER — Telehealth: Payer: Self-pay | Admitting: *Deleted

## 2014-06-15 NOTE — Telephone Encounter (Signed)
Patient cancelled appointment.

## 2014-06-16 ENCOUNTER — Encounter: Payer: Managed Care, Other (non HMO) | Admitting: Internal Medicine

## 2014-08-24 ENCOUNTER — Other Ambulatory Visit: Payer: Self-pay | Admitting: Internal Medicine

## 2014-09-02 ENCOUNTER — Telehealth: Payer: Self-pay | Admitting: Internal Medicine

## 2014-09-02 MED ORDER — ALLOPURINOL 300 MG PO TABS: 300.0000 mg | ORAL_TABLET | Freq: Every day | ORAL | Status: DC

## 2014-09-02 NOTE — Telephone Encounter (Signed)
Please send 90 day supply of   allopurinol (ZYLOPRIM) 300 MG tablet [[id number]]       to [facility]. Pt insurance requiring "maintenance meds" to be filled thru mail order in 90 day supply.

## 2014-09-02 NOTE — Telephone Encounter (Signed)
Rx sent to [facility] as requested.

## 2014-09-09 ENCOUNTER — Other Ambulatory Visit: Payer: Self-pay

## 2014-09-21 ENCOUNTER — Telehealth: Payer: Self-pay | Admitting: Behavioral Health

## 2014-09-21 NOTE — Telephone Encounter (Signed)
Unable to reach patient at time of Pre-Visit Call.  Left message for patient to return call when available.    

## 2014-09-22 ENCOUNTER — Encounter: Payer: Managed Care, Other (non HMO) | Admitting: Internal Medicine

## 2015-01-11 ENCOUNTER — Other Ambulatory Visit: Payer: Self-pay | Admitting: Internal Medicine

## 2015-02-20 ENCOUNTER — Encounter: Payer: Self-pay | Admitting: Emergency Medicine

## 2015-02-20 ENCOUNTER — Emergency Department (INDEPENDENT_AMBULATORY_CARE_PROVIDER_SITE_OTHER)
Admission: EM | Admit: 2015-02-20 | Discharge: 2015-02-20 | Disposition: A | Discharge status: Home / Self Care | Payer: Managed Care, Other (non HMO) | Source: Home / Self Care | Attending: Family Medicine | Admitting: Family Medicine

## 2015-02-20 DIAGNOSIS — M10041 Idiopathic gout, right hand: Secondary | ICD-10-CM

## 2015-02-20 DIAGNOSIS — M109 Gout, unspecified: Secondary | ICD-10-CM

## 2015-02-20 MED ORDER — HYDROCODONE-ACETAMINOPHEN 5-325 MG PO TABS: 1.0000 | ORAL_TABLET | Freq: Four times a day (QID) | ORAL | Status: DC | PRN

## 2015-02-20 MED ORDER — PREDNISONE 50 MG PO TABS: ORAL_TABLET | ORAL | Status: DC

## 2015-02-20 NOTE — ED Provider Notes (Signed)
CSN: [id number]     Arrival date & time 02/20/15  1058 History   First MD Initiated Contact with Patient 02/20/15 1159     Chief Complaint  Patient presents with  . Gout      HPI Comments: Patient complains of gout flare-up in his right hand 2 days ago.  He admits that he made some inappropriate diet choices just prior to onset.  He complains of pain/swelling in the dorsum of his right hand. His gout is normally controlled with his daily allopurinol.  He has not been able to afford the co-pay on colchicine.  He states that he has not had a gout attack for several years.  Patient is a 45 y.o. male presenting with hand injury. The history is provided by the patient.  Hand Injury Location:  Hand Time since incident:  2 days Injury: no   Hand location:  R hand Pain details:    Quality:  Aching   Radiates to:  Does not radiate   Severity:  Severe   Onset quality:  Sudden   Duration:  2 days   Timing:  Constant   Progression:  Worsening Chronicity:  Recurrent Handedness:  Right-handed Prior injury to area:  Yes Relieved by:  Nothing Worsened by:  Movement Ineffective treatments:  NSAIDs Associated symptoms: decreased range of motion, stiffness and swelling   Associated symptoms: no fatigue, no fever, no muscle weakness, no numbness and no tingling     Past Medical History  Diagnosis Date  . Hypertension   . Gout   . Allergy    Past Surgical History  Procedure Laterality Date  . Hand surgery Right     2006 for a FX   Family History  Problem Relation Age of Onset  . Cancer Mother     CERVICAL  . Hypertension Father   . Heart disease Other     MI  . Diabetes Other   . Heart failure Father   . Colon cancer Neg Hx   . Prostate cancer Neg Hx   . Stroke Neg Hx    Social History  Substance Use Topics  . Smoking status: Never Smoker   . Smokeless tobacco: Never Used  . Alcohol Use: Yes     Comment: very rare     Review of Systems  Constitutional: Negative for fever  and fatigue.  Musculoskeletal: Positive for stiffness.  All other systems reviewed and are negative.   Allergies  Pseudoephedrine  Home Medications   Prior to Admission medications   Medication Sig Start Date End Date Taking? Authorizing Provider  Ramipril (ALTACE PO) Take by mouth.   Yes Historical Provider, MD  allopurinol (ZYLOPRIM) 300 MG tablet Take 1 tablet (300 mg total) by mouth daily. 09/02/14   Colon Branch, MD  HYDROcodone-acetaminophen (NORCO/VICODIN) 5-325 MG tablet Take 1 tablet by mouth every 6 (six) hours as needed for moderate pain. 02/20/15   Kandra Nicolas, MD  predniSONE (DELTASONE) 50 MG tablet Take one tab by mouth with food once daily for five days 02/20/15   Kandra Nicolas, MD   Meds Ordered and Administered this Visit  Medications - No data to display  BP 166/98 mmHg  Pulse 114  Temp(Src) 98.7 F (37.1 C) (Oral)  Ht 6\' 1"  (1.854 m)  Wt 323 lb 8 oz (146.739 kg)  BMI 42.69 kg/m2  SpO2 96% No data found.   Physical Exam  Constitutional: He is oriented to person, place, and time. He appears well-developed  and well-nourished. No distress.  HENT:  Head: Normocephalic.  Eyes: Pupils are equal, round, and reactive to light.  Pulmonary/Chest: No respiratory distress.  Musculoskeletal:       Right hand: He exhibits decreased range of motion, tenderness, bony tenderness and swelling. He exhibits normal two-point discrimination and normal capillary refill. Normal sensation noted.       Hands: Dorsum of right hand has swelling, warmth, erythema, and distinct tenderness to palpation over the 4th and 5th metacarpals as noted on diagram.    Neurological: He is alert and oriented to person, place, and time.  Skin: Skin is warm and dry.  Nursing note and vitals reviewed.   ED Course  Procedures  None    MDM   1. Acute gout of right hand, unspecified cause    Begin prednisone burst.  Lortab for pain Increase fluid intake. Followup with Family Doctor if not  improved in about 5 days, or if symptoms worsen.    Kandra Nicolas, MD 02/20/15 1239

## 2015-02-20 NOTE — Discharge Instructions (Signed)
Increase fluid intake.   Gout Gout is an inflammatory arthritis caused by a buildup of uric acid crystals in the joints. Uric acid is a chemical that is normally present in the blood. When the level of uric acid in the blood is too high it can form crystals that deposit in your joints and tissues. This causes joint redness, soreness, and swelling (inflammation). Repeat attacks are common. Over time, uric acid crystals can form into masses (tophi) near a joint, destroying bone and causing disfigurement. Gout is treatable and often preventable. CAUSES  The disease begins with elevated levels of uric acid in the blood. Uric acid is produced by your body when it breaks down a naturally found substance called purines. Certain foods you eat, such as meats and fish, contain high amounts of purines. Causes of an elevated uric acid level include:  Being passed down from parent to child (heredity).  Diseases that cause increased uric acid production (such as obesity, psoriasis, and certain cancers).  Excessive alcohol use.  Diet, especially diets rich in meat and seafood.  Medicines, including certain cancer-fighting medicines (chemotherapy), water pills (diuretics), and aspirin.  Chronic kidney disease. The kidneys are no longer able to remove uric acid well.  Problems with metabolism. Conditions strongly associated with gout include:  Obesity.  High blood pressure.  High cholesterol.  Diabetes. Not everyone with elevated uric acid levels gets gout. It is not understood why some people get gout and others do not. Surgery, joint injury, and eating too much of certain foods are some of the factors that can lead to gout attacks. SYMPTOMS   An attack of gout comes on quickly. It causes intense pain with redness, swelling, and warmth in a joint.  Fever can occur.  Often, only one joint is involved. Certain joints are more commonly involved:  Base of the big  toe.  Knee.  Ankle.  Wrist.  Finger. Without treatment, an attack usually goes away in a few days to weeks. Between attacks, you usually will not have symptoms, which is different from many other forms of arthritis. DIAGNOSIS  Your caregiver will suspect gout based on your symptoms and exam. In some cases, tests may be recommended. The tests may include:  Blood tests.  Urine tests.  X-rays.  Joint fluid exam. This exam requires a needle to remove fluid from the joint (arthrocentesis). Using a microscope, gout is confirmed when uric acid crystals are seen in the joint fluid. TREATMENT  There are two phases to gout treatment: treating the sudden onset (acute) attack and preventing attacks (prophylaxis).  Treatment of an Acute Attack.  Medicines are used. These include anti-inflammatory medicines or steroid medicines.  An injection of steroid medicine into the affected joint is sometimes necessary.  The painful joint is rested. Movement can worsen the arthritis.  You may use warm or cold treatments on painful joints, depending which works best for you.  Treatment to Prevent Attacks.  If you suffer from frequent gout attacks, your caregiver may advise preventive medicine. These medicines are started after the acute attack subsides. These medicines either help your kidneys eliminate uric acid from your body or decrease your uric acid production. You may need to stay on these medicines for a very long time.  The early phase of treatment with preventive medicine can be associated with an increase in acute gout attacks. For this reason, during the first few months of treatment, your caregiver may also advise you to take medicines usually used for acute  gout treatment. Be sure you understand your caregiver's directions. Your caregiver may make several adjustments to your medicine dose before these medicines are effective.  Discuss dietary treatment with your caregiver or dietitian.  Alcohol and drinks high in sugar and fructose and foods such as meat, poultry, and seafood can increase uric acid levels. Your caregiver or dietitian can advise you on drinks and foods that should be limited. HOME CARE INSTRUCTIONS   Do not take aspirin to relieve pain. This raises uric acid levels.  Only take over-the-counter or prescription medicines for pain, discomfort, or fever as directed by your caregiver.  Rest the joint as much as possible. When in bed, keep sheets and blankets off painful areas.  Keep the affected joint raised (elevated).  Apply warm or cold treatments to painful joints. Use of warm or cold treatments depends on which works best for you.  Use crutches if the painful joint is in your leg.  Drink enough fluids to keep your urine clear or pale yellow. This helps your body get rid of uric acid. Limit alcohol, sugary drinks, and fructose drinks.  Follow your dietary instructions. Pay careful attention to the amount of protein you eat. Your daily diet should emphasize fruits, vegetables, whole grains, and fat-free or low-fat milk products. Discuss the use of coffee, vitamin C, and cherries with your caregiver or dietitian. These may be helpful in lowering uric acid levels.  Maintain a healthy body weight. SEEK MEDICAL CARE IF:   You develop diarrhea, vomiting, or any side effects from medicines.  You do not feel better in 24 hours, or you are getting worse. SEEK IMMEDIATE MEDICAL CARE IF:   Your joint becomes suddenly more tender, and you have chills or a fever. MAKE SURE YOU:   Understand these instructions.  Will watch your condition.  Will get help right away if you are not doing well or get worse.   This information is not intended to replace advice given to you by your health care provider. Make sure you discuss any questions you have with your health care provider.   Document Released: 01/14/2000 Document Revised: 02/06/2014 Document Reviewed:  08/30/2011 Elsevier Interactive Patient Education 2016 [place] are compounds that affect the level of uric acid in your body. A low-purine diet is a diet that is low in purines. Eating a low-purine diet can prevent the level of uric acid in your body from getting too high and causing gout or kidney stones or both. WHAT DO I NEED TO KNOW ABOUT THIS DIET?  Choose low-purine foods. Examples of low-purine foods are listed in the next section.  Drink plenty of fluids, especially water. Fluids can help remove uric acid from your body. Try to drink 8-16 cups (1.9-3.8 L) a day.  Limit foods high in fat, especially saturated fat, as fat makes it harder for the body to get rid of uric acid. Foods high in saturated fat include pizza, cheese, ice cream, whole milk, fried foods, and gravies. Choose foods that are lower in fat and lean sources of protein. Use olive oil when cooking as it contains healthy fats that are not high in saturated fat.  Limit alcohol. Alcohol interferes with the elimination of uric acid from your body. If you are having a gout attack, avoid all alcohol.  Keep in mind that different people's bodies react differently to different foods. You will probably learn over time which foods do or do not affect you. If you  discover that a food tends to cause your gout to flare up, avoid eating that food. You can more freely enjoy foods that do not cause problems. If you have any questions about a food item, talk to your dietitian or health care provider. WHICH FOODS ARE LOW, MODERATE, AND HIGH IN PURINES? The following is a list of foods that are low, moderate, and high in purines. You can eat any amount of the foods that are low in purines. You may be able to have small amounts of foods that are moderate in purines. Ask your health care provider how much of a food moderate in purines you can have. Avoid foods high in purines. Grains  Foods low in purines:  Enriched white bread, pasta, rice, cake, cornbread, popcorn.  Foods moderate in purines: Whole-grain breads and cereals, wheat germ, bran, oatmeal. Uncooked oatmeal. Dry wheat bran or wheat germ.  Foods high in purines: Pancakes, [place] toast, biscuits, muffins. Vegetables  Foods low in purines: All vegetables, except those that are moderate in purines.  Foods moderate in purines: Asparagus, cauliflower, spinach, mushrooms, green peas. Fruits  All fruits are low in purines. Meats and other Protein Foods  Foods low in purines: Eggs, nuts, peanut butter.  Foods moderate in purines: 80-90% lean beef, lamb, veal, pork, poultry, fish, eggs, peanut butter, nuts. Crab, lobster, oysters, and shrimp. Cooked dried beans, peas, and lentils.  Foods high in purines: Anchovies, sardines, herring, mussels, tuna, codfish, scallops, trout, and haddock. Berniece Salines. Organ meats (such as liver or kidney). Tripe. Game meat. Goose. Sweetbreads. Dairy  All dairy foods are low in purines. Low-fat and fat-free dairy products are best because they are low in saturated fat. Beverages  Drinks low in purines: Water, carbonated beverages, tea, coffee, cocoa.  Drinks moderate in purines: Soft drinks and other drinks sweetened with high-fructose corn syrup. Juices. To find whether a food or drink is sweetened with high-fructose corn syrup, look at the ingredients list.  Drinks high in purines: Alcoholic beverages (such as beer). Condiments  Foods low in purines: Salt, herbs, olives, pickles, relishes, vinegar.  Foods moderate in purines: Butter, margarine, oils, mayonnaise. Fats and Oils  Foods low in purines: All types, except gravies and sauces made with meat.  Foods high in purines: Gravies and sauces made with meat. Other Foods  Foods low in purines: Sugars, sweets, gelatin. Cake. Soups made without meat.  Foods moderate in purines: Meat-based or fish-based soups, broths, or bouillons. Foods and drinks  sweetened with high-fructose corn syrup.  Foods high in purines: High-fat desserts (such as ice cream, cookies, cakes, pies, doughnuts, and chocolate). Contact your dietitian for more information on foods that are not listed here.   This information is not intended to replace advice given to you by your health care provider. Make sure you discuss any questions you have with your health care provider.   Document Released: 05/13/2010 Document Revised: 01/21/2013 Document Reviewed: 12/23/2012 Elsevier Interactive Patient Education [employer].

## 2015-02-20 NOTE — ED Notes (Signed)
Pt c/o right hand pain since Thursday, hx of gout, no injury, swelling and very painful.

## 2015-04-01 ENCOUNTER — Other Ambulatory Visit: Payer: Self-pay | Admitting: Internal Medicine

## 2015-04-07 ENCOUNTER — Other Ambulatory Visit: Payer: Self-pay | Admitting: Internal Medicine

## 2015-04-09 ENCOUNTER — Other Ambulatory Visit: Payer: Self-pay | Admitting: Internal Medicine

## 2015-04-11 ENCOUNTER — Other Ambulatory Visit: Payer: Self-pay | Admitting: Internal Medicine

## 2015-04-15 ENCOUNTER — Telehealth: Payer: Self-pay | Admitting: Internal Medicine

## 2015-04-15 NOTE — Telephone Encounter (Signed)
Caller name: Self  Can be reached: [facility]: [id number] - [place], [place] [phone] (Phone) [phone] (Fax)         Reason for call: Refill olosartan (COZAAR) 50 MG tablet sartan (COZAAR) 50 MG tablet [id number]

## 2015-04-15 NOTE — Telephone Encounter (Signed)
Pt has not been seen since 03/11/2014, needed CPE in 3 months from then. Needs appt for refills. Losartan also not on med list.

## 2015-04-21 ENCOUNTER — Ambulatory Visit (INDEPENDENT_AMBULATORY_CARE_PROVIDER_SITE_OTHER): Payer: Managed Care, Other (non HMO) | Admitting: Internal Medicine

## 2015-04-21 ENCOUNTER — Encounter: Payer: Self-pay | Admitting: Internal Medicine

## 2015-04-21 DIAGNOSIS — Z09 Encounter for follow-up examination after completed treatment for conditions other than malignant neoplasm: Secondary | ICD-10-CM

## 2015-04-21 DIAGNOSIS — R739 Hyperglycemia, unspecified: Secondary | ICD-10-CM

## 2015-04-21 DIAGNOSIS — N529 Male erectile dysfunction, unspecified: Secondary | ICD-10-CM

## 2015-04-21 DIAGNOSIS — M109 Gout, unspecified: Secondary | ICD-10-CM | POA: Diagnosis not present

## 2015-04-21 DIAGNOSIS — I10 Essential (primary) hypertension: Secondary | ICD-10-CM | POA: Diagnosis not present

## 2015-04-21 DIAGNOSIS — R7303 Prediabetes: Secondary | ICD-10-CM

## 2015-04-21 MED ORDER — ALLOPURINOL 300 MG PO TABS: 300.0000 mg | ORAL_TABLET | Freq: Every day | ORAL | Status: DC

## 2015-04-21 MED ORDER — LOSARTAN POTASSIUM 50 MG PO TABS: 50.0000 mg | ORAL_TABLET | Freq: Every day | ORAL | Status: DC

## 2015-04-21 MED ORDER — SILDENAFIL CITRATE 100 MG PO TABS: 50.0000 mg | ORAL_TABLET | Freq: Every day | ORAL | Status: DC | PRN

## 2015-04-21 NOTE — Patient Instructions (Addendum)
GO TO THE FRONT DESK ---Schedule labs to be done in 2 weeks , fasting ---Schedule your next appointment for a  Routine check up When?   3 months from today Fasting?  No       Check the  blood pressure 2 or 3 times a month  Be sure your blood pressure is between 110/65 and  145/85. If it is consistently higher or lower, let me know   Please visit the [facility] website   [email or web address]

## 2015-04-21 NOTE — Progress Notes (Signed)
Pre visit review using our clinic review tool, if applicable. No additional management support is needed unless otherwise documented below in the visit note. 

## 2015-04-21 NOTE — Progress Notes (Signed)
Subjective:    Patient ID: Kevin Baxter, male    DOB: 12/03/1970, 45 y.o.   MRN: [id number]  DOS:  04/21/2015 Type of visit - description : Routine visit Interval history: HTN: Ran out of losartan 2 weeks ago, previously ambulatory BPs were always less than 130/70. No side effects from the medication. Gout: Good compliance with allopurinol, most recent episode was several weeks ago. Prediabetes: No doing well with diet, just started to exercise 2 weeks ago.    Wt Readings from Last 3 Encounters:  04/21/15 324 lb 2 oz (147.022 kg)  02/20/15 323 lb 8 oz (146.739 kg)  03/11/14 323 lb (146.512 kg)     Review of Systems denies chest pain or difficulty breathing No nausea, vomiting, diarrhea Past Medical History  Diagnosis Date  . Hypertension   . Gout   . Allergy     Past Surgical History  Procedure Laterality Date  . Hand surgery Right     2006 for a FX    Social History   Social History  . Marital Status: Married    Spouse Name: N/A  . Number of Children: 2  . Years of Education: N/A   Occupational History  . works for [employer]  . Smoking status: Never Smoker   . Smokeless tobacco: Never Used  . Alcohol Use: Yes     Comment: very rare   . Drug Use: No  . Sexual Activity: Not on file   Other Topics Concern  . Not on file   Social History Narrative        Medication List       This list is accurate as of: 04/21/15 11:59 PM.  Always use your most recent med list.               allopurinol 300 MG tablet  Commonly known as:  ZYLOPRIM  Take 1 tablet (300 mg total) by mouth daily.     losartan 50 MG tablet  Commonly known as:  COZAAR  Take 1 tablet (50 mg total) by mouth daily.     sildenafil 100 MG tablet  Commonly known as:  VIAGRA  Take 0.5-1 tablets (50-100 mg total) by mouth daily as needed for erectile dysfunction.           Objective:   Physical Exam BP 138/84 mmHg  Pulse 55   Temp(Src) 98 F (36.7 C) (Oral)  Ht 6\' 1"  (1.854 m)  Wt 324 lb 2 oz (147.022 kg)  BMI 42.77 kg/m2  SpO2 97% General:   Well developed, well nourished . NAD.  HEENT:  Normocephalic . Face symmetric, atraumatic Lungs:  CTA B Normal respiratory effort, no intercostal retractions, no accessory muscle use. Heart: RRR,  no murmur.  No pretibial edema bilaterally  Skin: Not pale. Not jaundice Neurologic:  alert & oriented X3.  Speech normal, gait appropriate for age and unassisted Psych--  Cognition and judgment appear intact.  Cooperative with normal attention span and concentration.  Behavior appropriate. No anxious or depressed appearing.      Assessment & Plan:   Assessment: Prediabetes since 2004, a1c 6.2  HTN Gout Morbid obesity  ED  Plan: Prediabetes: We had extensive discussion about diet and exercise. Will check A1c. HTN: Out of losartan 2 weeks ago, BP is still okay. Refill medications, labs in 2 weeks, CMP Gout: Refill allopurinol, check a uric acid. ED: Needs a refill on Viagra, has use it  before successfully without side effects Morbid obesity: Again we had extensive discussion about diet and exercise RTC 3 months, see instructions  Today, I spent more than  26  min with the patient: >50% of the time counseling regards diet, weight loss, different approaches discussed. Questions answered.

## 2015-04-22 DIAGNOSIS — Z09 Encounter for follow-up examination after completed treatment for conditions other than malignant neoplasm: Secondary | ICD-10-CM | POA: Insufficient documentation

## 2015-04-22 NOTE — Assessment & Plan Note (Signed)
Prediabetes: We had extensive discussion about diet and exercise. Will check A1c. HTN: Out of losartan 2 weeks ago, BP is still okay. Refill medications, labs in 2 weeks, CMP Gout: Refill allopurinol, check a uric acid. ED: Needs a refill on Viagra, has use it before successfully without side effects Morbid obesity: Again we had extensive discussion about diet and exercise RTC 3 months, see instructions

## 2015-05-05 ENCOUNTER — Other Ambulatory Visit (INDEPENDENT_AMBULATORY_CARE_PROVIDER_SITE_OTHER): Payer: Managed Care, Other (non HMO)

## 2015-05-05 DIAGNOSIS — I10 Essential (primary) hypertension: Secondary | ICD-10-CM

## 2015-05-05 DIAGNOSIS — R739 Hyperglycemia, unspecified: Secondary | ICD-10-CM | POA: Diagnosis not present

## 2015-05-05 DIAGNOSIS — M109 Gout, unspecified: Secondary | ICD-10-CM | POA: Diagnosis not present

## 2015-05-05 LAB — CBC WITH DIFFERENTIAL/PLATELET
Basophils Absolute: 0 10*3/uL (ref 0.0–0.1)
Basophils Relative: 0.5 % (ref 0.0–3.0)
Eosinophils Absolute: 0.3 10*3/uL (ref 0.0–0.7)
Eosinophils Relative: 5 % (ref 0.0–5.0)
HCT: 50.3 % (ref 39.0–52.0)
Hemoglobin: 16.5 g/dL (ref 13.0–17.0)
Lymphocytes Relative: 30.4 % (ref 12.0–46.0)
Lymphs Abs: 1.7 10*3/uL (ref 0.7–4.0)
MCHC: 32.7 g/dL (ref 30.0–36.0)
MCV: 77.3 fl — ABNORMAL LOW (ref 78.0–100.0)
Monocytes Absolute: 0.4 10*3/uL (ref 0.1–1.0)
Monocytes Relative: 7.7 % (ref 3.0–12.0)
Neutro Abs: 3.1 10*3/uL (ref 1.4–7.7)
Neutrophils Relative %: 56.4 % (ref 43.0–77.0)
Platelets: 255 10*3/uL (ref 150.0–400.0)
RBC: 6.51 Mil/uL — ABNORMAL HIGH (ref 4.22–5.81)
RDW: 16.5 % — ABNORMAL HIGH (ref 11.5–15.5)
WBC: 5.6 10*3/uL (ref 4.0–10.5)

## 2015-05-05 LAB — LIPID PANEL
Cholesterol: 155 mg/dL (ref 0–200)
HDL: 46.3 mg/dL (ref >39.00)
LDL Cholesterol: 90 mg/dL (ref 0–99)
NonHDL: 108.35
Total CHOL/HDL Ratio: 3
Triglycerides: 92 mg/dL (ref 0.0–149.0)
VLDL: 18.4 mg/dL (ref 0.0–40.0)

## 2015-05-05 LAB — COMPREHENSIVE METABOLIC PANEL
ALT: 15 U/L (ref 0–53)
AST: 13 U/L (ref 0–37)
Albumin: 4 g/dL (ref 3.5–5.2)
Alkaline Phosphatase: 51 U/L (ref 39–117)
BUN: 15 mg/dL (ref 6–23)
CO2: 30 mEq/L (ref 19–32)
Calcium: 9.3 mg/dL (ref 8.4–10.5)
Chloride: 104 mEq/L (ref 96–112)
Creatinine, Ser: 1.12 mg/dL (ref 0.40–1.50)
GFR: 90.58 mL/min (ref >60.00)
Glucose, Bld: 100 mg/dL — ABNORMAL HIGH (ref 70–99)
Potassium: 4.1 mEq/L (ref 3.5–5.1)
Sodium: 140 mEq/L (ref 135–145)
Total Bilirubin: 0.9 mg/dL (ref 0.2–1.2)
Total Protein: 7.3 g/dL (ref 6.0–8.3)

## 2015-05-05 LAB — URIC ACID: Uric Acid, Serum: 7.9 mg/dL — ABNORMAL HIGH (ref 4.0–7.8)

## 2015-05-05 LAB — HEMOGLOBIN A1C: Hgb A1c MFr Bld: 6.4 % (ref 4.6–6.5)

## 2015-05-11 ENCOUNTER — Telehealth: Payer: Self-pay | Admitting: *Deleted

## 2015-05-11 ENCOUNTER — Other Ambulatory Visit: Payer: Self-pay | Admitting: Internal Medicine

## 2015-05-11 MED ORDER — PREDNISONE 10 MG PO TABS: ORAL_TABLET | ORAL | Status: DC

## 2015-05-11 NOTE — Telephone Encounter (Signed)
Please advise 

## 2015-05-11 NOTE — Telephone Encounter (Signed)
Caller name: Self  Can be reached: [phone]  Pharmacy:  [id number] [place], [place]. [phone] (Phone) [phone] (Fax)       Reason for call: Request rx for Prednisone be sent to [place]. He is having a flare up of Gout.   Also received a call this morning but no message was left. Request call back about lab results

## 2015-05-11 NOTE — Telephone Encounter (Signed)
Completed [employer] form, with exception of waist circumference; LMOM with contact name and number for return call/SLS

## 2015-05-11 NOTE — Telephone Encounter (Signed)
Advise patient: refill for prednisone ready to be sent If he is not better or if there is any questions about pain being from gout ---> needs to see a local doctor.

## 2015-05-11 NOTE — Telephone Encounter (Signed)
[facility] [place], spoke w/ Pt informed [facility] and informed him of lab results. Pt verbalized understanding.

## 2015-05-12 NOTE — Telephone Encounter (Signed)
error 

## 2015-05-19 NOTE — Telephone Encounter (Signed)
Information received; paperwork faxed/SLS 04/19

## 2015-07-19 ENCOUNTER — Telehealth: Payer: Self-pay

## 2015-07-19 NOTE — Telephone Encounter (Signed)
Talked to patients wife concerning coding of and outstanding bill. Informed patient I will have coding to review and give her an update

## 2015-07-19 NOTE — Telephone Encounter (Signed)
Per coding: Documentation supports a level 4. Per Dr. Larose Kells notes, states follow up and patient was out of medication, also states extensive conversation about pt's prediabetes and weight.  Thanks,  [employer]

## 2015-07-27 ENCOUNTER — Ambulatory Visit: Payer: Managed Care, Other (non HMO) | Admitting: Internal Medicine

## 2015-09-02 ENCOUNTER — Ambulatory Visit: Payer: Managed Care, Other (non HMO) | Admitting: Internal Medicine

## 2015-12-20 ENCOUNTER — Other Ambulatory Visit: Payer: Self-pay | Admitting: Internal Medicine

## 2016-01-10 ENCOUNTER — Other Ambulatory Visit: Payer: Self-pay | Admitting: Internal Medicine

## 2016-04-17 ENCOUNTER — Other Ambulatory Visit: Payer: Self-pay | Admitting: Internal Medicine

## 2016-04-26 ENCOUNTER — Other Ambulatory Visit: Payer: Self-pay | Admitting: Internal Medicine

## 2016-05-08 ENCOUNTER — Other Ambulatory Visit: Payer: Self-pay | Admitting: Internal Medicine

## 2016-05-15 NOTE — Telephone Encounter (Signed)
Patient requesting a few pills to hold him over until Wednesday

## 2016-05-17 ENCOUNTER — Encounter: Payer: Self-pay | Admitting: Internal Medicine

## 2016-05-17 ENCOUNTER — Telehealth: Payer: Self-pay

## 2016-05-17 ENCOUNTER — Ambulatory Visit (INDEPENDENT_AMBULATORY_CARE_PROVIDER_SITE_OTHER): Payer: Managed Care, Other (non HMO) | Admitting: Internal Medicine

## 2016-05-17 VITALS — BP 138/82 | HR 94 | Temp 98.0°F | Resp 14 | Ht 73.0 in | Wt 303.2 lb

## 2016-05-17 DIAGNOSIS — Z114 Encounter for screening for human immunodeficiency virus [HIV]: Secondary | ICD-10-CM

## 2016-05-17 DIAGNOSIS — R7303 Prediabetes: Secondary | ICD-10-CM

## 2016-05-17 DIAGNOSIS — I10 Essential (primary) hypertension: Secondary | ICD-10-CM

## 2016-05-17 DIAGNOSIS — N529 Male erectile dysfunction, unspecified: Secondary | ICD-10-CM

## 2016-05-17 DIAGNOSIS — M109 Gout, unspecified: Secondary | ICD-10-CM

## 2016-05-17 MED ORDER — LOSARTAN POTASSIUM 50 MG PO TABS: 50.0000 mg | ORAL_TABLET | Freq: Every day | ORAL | 8 refills | Status: DC

## 2016-05-17 MED ORDER — ALLOPURINOL 300 MG PO TABS: 300.0000 mg | ORAL_TABLET | Freq: Every day | ORAL | 8 refills | Status: DC

## 2016-05-17 MED ORDER — SILDENAFIL CITRATE 20 MG PO TABS: 60.0000 mg | ORAL_TABLET | Freq: Every day | ORAL | 5 refills | Status: DC | PRN

## 2016-05-17 NOTE — Assessment & Plan Note (Signed)
Prediabetes: Doing great with diet and exercise, check A1c. HTN: Continue losartan, check a CMP and TSH Gout: On allopurinol, asx for a year, is losing weight, consider decrease allopurinol dose. Morbid obesity: Doing great, lost 20 pounds in a year, he is avoiding red meats and pork. He is more active. He asked about his weight goal, I set a goal of 230 pounds and he believes that is doable (BMI of 31). A healthier BMI below 30 is unrealistic. ED: Refill generic Viagra. RTC 6-8 months, CPX

## 2016-05-17 NOTE — Telephone Encounter (Signed)
PA initiated via Covermymeds; KEY: [id number]. Awaiting determination.

## 2016-05-17 NOTE — Progress Notes (Signed)
Pre visit review using our clinic review tool, if applicable. No additional management support is needed unless otherwise documented below in the visit note. 

## 2016-05-17 NOTE — Patient Instructions (Signed)
GO TO THE LAB : Get the blood work     GO TO THE FRONT DESK Schedule your next appointment for a  Physical exam in 6-8 months, fasting   We are sending a generic viagra called sildenafil 20 mg: Take 3-4 tablets daily only as needed.

## 2016-05-17 NOTE — Progress Notes (Signed)
Subjective:    Patient ID: Kevin Baxter, male    DOB: 1970/07/24, 46 y.o.   MRN: [id number]  DOS:  05/17/2016 Type of visit - description : Routine visit Interval history: HTN: Good compliance w/medication, no recent ambulatory BPs Gout: Good medication compliance, no symptoms in more than a year Morbid obesity: Has lost about 20 pounds over the last year by changing his diet. Doing great. Exercising more. ED: Symptoms on and off, he relates symptoms to stress, sometimes he has no problems.   Wt Readings from Last 3 Encounters:  05/17/16 (!) 303 lb 4 oz (137.6 kg)  04/21/15 (!) 324 lb 2 oz (147 kg)  02/20/15 (!) 323 lb 8 oz (146.7 kg)    Review of Systems Denies chest pain difficulty breathing No nausea, vomiting, diarrhea.   Past Medical History:  Diagnosis Date  . Allergy   . Gout   . Hypertension     Past Surgical History:  Procedure Laterality Date  . HAND SURGERY Right    2006 for a FX    Social History   Social History  . Marital status: Married    Spouse name: N/A  . Number of children: 2  . Years of education: N/A   Occupational History  . works for [employer]  . Smoking status: Never Smoker  . Smokeless tobacco: Never Used  . Alcohol use Yes     Comment: very rare   . Drug use: No  . Sexual activity: Not on file   Other Topics Concern  . Not on file   Social History Narrative  . No narrative on file      Allergies as of 05/17/2016      Reactions   Pseudoephedrine    REACTION: jittery      Medication List       Accurate as of 05/17/16  5:14 PM. Always use your most recent med list.          allopurinol 300 MG tablet Commonly known as:  ZYLOPRIM Take 1 tablet (300 mg total) by mouth daily.   losartan 50 MG tablet Commonly known as:  COZAAR Take 1 tablet (50 mg total) by mouth daily.   sildenafil 20 MG tablet Commonly known as:  REVATIO Take 3-4 tablets (60-80 mg total) by  mouth daily as needed (ED).          Objective:   Physical Exam BP 138/82 (BP Location: Left Arm, Patient Position: Sitting, Cuff Size: Normal)   Pulse 94   Temp 98 F (36.7 C) (Oral)   Resp 14   Ht 6\' 1"  (1.854 m)   Wt (!) 303 lb 4 oz (137.6 kg)   SpO2 97%   BMI 40.01 kg/m  General:   Well developed, well nourished . NAD.  HEENT:  Normocephalic . Face symmetric, atraumatic Lungs:  CTA B Normal respiratory effort, no intercostal retractions, no accessory muscle use. Heart: RRR,  no murmur.  No pretibial edema bilaterally  Skin: Not pale. Not jaundice Neurologic:  alert & oriented X3.  Speech normal, gait appropriate for age and unassisted Psych--  Cognition and judgment appear intact.  Cooperative with normal attention span and concentration.  Behavior appropriate. No anxious or depressed appearing.      Assessment & Plan:   Assessment: Prediabetes since 2004, a1c 6.2  HTN Gout Morbid obesity  ED  PLAN: Prediabetes: Doing great with diet and exercise, check A1c. HTN: Continue  losartan, check a CMP and TSH Gout: On allopurinol, asx for a year, is losing weight, consider decrease allopurinol dose. Morbid obesity: Doing great, lost 20 pounds in a year, he is avoiding red meats and pork. He is more active. He asked about his weight goal, I set a goal of 230 pounds and he believes that is doable (BMI of 31). A healthier BMI below 30 is unrealistic. ED: Refill generic Viagra. RTC 6-8 months, CPX

## 2016-05-18 LAB — BASIC METABOLIC PANEL
BUN: 10 mg/dL (ref 6–23)
CO2: 28 mEq/L (ref 19–32)
Calcium: 9.4 mg/dL (ref 8.4–10.5)
Chloride: 102 mEq/L (ref 96–112)
Creatinine, Ser: 1.07 mg/dL (ref 0.40–1.50)
GFR: 95.06 mL/min (ref >60.00)
Glucose, Bld: 84 mg/dL (ref 70–99)
Potassium: 4.2 mEq/L (ref 3.5–5.1)
Sodium: 139 mEq/L (ref 135–145)

## 2016-05-18 LAB — TSH: TSH: 1.44 u[IU]/mL (ref 0.35–4.50)

## 2016-05-18 LAB — HEMOGLOBIN A1C: Hgb A1c MFr Bld: 6 % (ref 4.6–6.5)

## 2016-05-18 LAB — URIC ACID: Uric Acid, Serum: 7.4 mg/dL (ref 4.0–7.8)

## 2016-05-19 NOTE — Telephone Encounter (Signed)
Received [place] denial notification. Coverage is provided to those w/ pulmonary arterial hypertension (WHO Group 1 PAH). Notification sent for scanning.

## 2016-05-19 NOTE — Telephone Encounter (Signed)
CaseId:[id number];Status:Denied;Review Type:Prior Auth;Appeal Information: Attention:ATTN: [place] [id number]. BVQXI,HW,[id number] KJZPH:[phone] AXK:[phone]

## 2016-05-22 ENCOUNTER — Other Ambulatory Visit: Payer: Self-pay | Admitting: Internal Medicine

## 2016-09-13 ENCOUNTER — Telehealth: Payer: Self-pay | Admitting: Internal Medicine

## 2016-09-13 MED ORDER — PREDNISONE 10 MG PO TABS: ORAL_TABLET | ORAL | 0 refills | Status: DC

## 2016-09-13 NOTE — Telephone Encounter (Signed)
Please advise 

## 2016-09-13 NOTE — Telephone Encounter (Signed)
Advise patient, I sent a prescription, call if not improving. Call if fever, chills or severe symptoms.

## 2016-09-13 NOTE — Telephone Encounter (Signed)
Spoke w/ Pt, informed [facility] and informed of recommendations. Pt verbalized understanding.

## 2016-09-13 NOTE — Telephone Encounter (Signed)
Pt called in he said that he is having a gout flare up and is in pain. He would like to have a Rx for prednisone sent to pharmacy if possible.   [facility] [place]      CB: [phone] - if needed.

## 2016-11-23 ENCOUNTER — Encounter: Payer: Managed Care, Other (non HMO) | Admitting: Internal Medicine

## 2017-01-01 ENCOUNTER — Ambulatory Visit (INDEPENDENT_AMBULATORY_CARE_PROVIDER_SITE_OTHER): Payer: Managed Care, Other (non HMO) | Admitting: Internal Medicine

## 2017-01-01 ENCOUNTER — Telehealth: Payer: Self-pay | Admitting: Internal Medicine

## 2017-01-01 ENCOUNTER — Encounter: Payer: Self-pay | Admitting: Internal Medicine

## 2017-01-01 VITALS — BP 134/72 | HR 82 | Temp 98.1°F | Resp 14 | Ht 73.0 in | Wt 311.0 lb

## 2017-01-01 DIAGNOSIS — Z0001 Encounter for general adult medical examination with abnormal findings: Secondary | ICD-10-CM

## 2017-01-01 DIAGNOSIS — R739 Hyperglycemia, unspecified: Secondary | ICD-10-CM

## 2017-01-01 DIAGNOSIS — Z0000 Encounter for general adult medical examination without abnormal findings: Secondary | ICD-10-CM | POA: Diagnosis not present

## 2017-01-01 DIAGNOSIS — Z0279 Encounter for issue of other medical certificate: Secondary | ICD-10-CM

## 2017-01-01 LAB — HEMOGLOBIN A1C: Hgb A1c MFr Bld: 6 % (ref 4.6–6.5)

## 2017-01-01 NOTE — Progress Notes (Signed)
Subjective:    Patient ID: Kevin Baxter, male    DOB: [birth year], 46 y.o.   MRN: [id number]  DOS:  01/01/2017 Type of visit - description : cpx Interval history: Since the last office visit, he lost his father, obviously sad about the situation, they were very close. Diet this is still okay but he has not been exercising at all.  Has gained some weight.  Wt Readings from Last 3 Encounters:  01/01/17 (!) 311 lb (141.1 kg)  05/17/16 (!) 303 lb 4 oz (137.6 kg)  04/21/15 (!) 324 lb 2 oz (147 kg)     Review of Systems  Other than above, a 14 point review of systems is negative     Past Medical History:  Diagnosis Date  . Allergy   . Gout   . Hypertension     Past Surgical History:  Procedure Laterality Date  . HAND SURGERY Right    2006 for a FX    Social History   Socioeconomic History  . Marital status: Married    Spouse name: Not on file  . Number of children: 2  . Years of education: Not on file  . Highest education level: Not on file  Social Needs  . Financial resource strain: Not on file  . Food insecurity - worry: Not on file  . Food insecurity - inability: Not on file  . Transportation needs - medical: Not on file  . Transportation needs - non-medical: Not on file  Occupational History  . Occupation: works for [employer]  . Smoking status: Never Smoker  . Smokeless tobacco: Never Used  Substance and Sexual Activity  . Alcohol use: Yes    Comment: very rare   . Drug use: No  . Sexual activity: Not on file  Other Topics Concern  . Not on file  Social History Narrative   Household: pt, wife daughter (2013),  boy  (2006),    Family History  Problem Relation Age of Onset  . Cancer Mother        CERVICAL  . Hypertension Father   . Heart failure Father        no MI, d/t etoh  . Diabetes Other   . Colon cancer Neg Hx   . Prostate cancer Neg Hx   . Stroke Neg Hx       Allergies as of 01/01/2017      Reactions   Pseudoephedrine      REACTION: jittery      Medication List        Accurate as of 01/01/17  6:58 PM. Always use your most recent med list.          allopurinol 300 MG tablet Commonly known as:  ZYLOPRIM Take 1 tablet (300 mg total) by mouth daily.   losartan 50 MG tablet Commonly known as:  COZAAR Take 1 tablet (50 mg total) by mouth daily.   sildenafil 20 MG tablet Commonly known as:  REVATIO Take 3-4 tablets (60-80 mg total) by mouth daily as needed (ED).          Objective:   Physical Exam BP 134/72 (BP Location: Left Arm, Patient Position: Sitting, Cuff Size: Normal)   Pulse 82   Temp 98.1 F (36.7 C) (Oral)   Resp 14   Ht 6\' 1"  (1.854 m)   Wt (!) 311 lb (141.1 kg)   SpO2 96%   BMI 41.03 kg/m  General:   Well developed, overweight  appearing  . NAD.  Neck: No  thyromegaly  HEENT:  Normocephalic . Face symmetric, atraumatic Lungs:  CTA B Normal respiratory effort, no intercostal retractions, no accessory muscle use. Heart: RRR,  no murmur.  No pretibial edema bilaterally  Abdomen:  Not distended, soft, non-tender. No rebound or rigidity.   Skin: Exposed areas without rash. Not pale. Not jaundice Neurologic:  alert & oriented X3.  Speech normal, gait appropriate for age and unassisted Strength symmetric and appropriate for age.  Psych: Cognition and judgment appear intact.  Cooperative with normal attention span and concentration.  Behavior appropriate. No anxious or depressed appearing.     Assessment & Plan:   Assessment: Prediabetes since 2004, a1c 6.2  HTN Gout Morbid obesity  ED  PLAN: DM checking labs HTN: Control when checked, continue losartan Gout: On allopurinol, no recent events Morbid obesity: Since the last visit he lost his father, loss  motivation, has not been exercising, still eating okay.  Has gained weight but plans to go back to her routine exercise actually today.  Patient is counseled, condolences provided.  Consider see one of the  bariatric doctors in town if needed. RTC 6-8 months

## 2017-01-01 NOTE — Progress Notes (Signed)
Pre visit review using our clinic review tool, if applicable. No additional management support is needed unless otherwise documented below in the visit note. 

## 2017-01-01 NOTE — Assessment & Plan Note (Signed)
DM checking labs HTN: Control when checked, continue losartan Gout: On allopurinol, no recent events Morbid obesity: Since the last visit he lost his father, loss  motivation, has not been exercising, still eating okay.  Has gained weight but plans to go back to her routine exercise actually today.  Patient is counseled, condolences provided.  Consider see one of the bariatric doctors in town if needed. RTC 6-8 months

## 2017-01-01 NOTE — Patient Instructions (Signed)
GO TO THE LAB : Get the blood work     GO TO THE FRONT DESK Schedule your next appointment for a   Check up in 6 to 8 months

## 2017-01-01 NOTE — Telephone Encounter (Signed)
Patient drop off Wellness Form to be completed and fax to number attached on paper. Form was place in the [employer]

## 2017-01-01 NOTE — Assessment & Plan Note (Addendum)
-   Td 2015, declined flu shot  - CCS: no FH, never had a colonoscopy -prostate ca screening: pro/cons discussed, declined screen for this year but will think about next year. -Diet and exercise discussed -Labs: CMP, FLP, A1c.  Declined HIV

## 2017-01-02 LAB — COMPREHENSIVE METABOLIC PANEL
ALT: 13 U/L (ref 0–53)
AST: 13 U/L (ref 0–37)
Albumin: 4.4 g/dL (ref 3.5–5.2)
Alkaline Phosphatase: 55 U/L (ref 39–117)
BUN: 11 mg/dL (ref 6–23)
CO2: 27 mEq/L (ref 19–32)
Calcium: 9.7 mg/dL (ref 8.4–10.5)
Chloride: 104 mEq/L (ref 96–112)
Creatinine, Ser: 1.16 mg/dL (ref 0.40–1.50)
GFR: 86.37 mL/min (ref >60.00)
Glucose, Bld: 93 mg/dL (ref 70–99)
Potassium: 4 mEq/L (ref 3.5–5.1)
Sodium: 140 mEq/L (ref 135–145)
Total Bilirubin: 0.8 mg/dL (ref 0.2–1.2)
Total Protein: 7.6 g/dL (ref 6.0–8.3)

## 2017-01-02 LAB — LIPID PANEL
Cholesterol: 191 mg/dL (ref 0–200)
HDL: 56.7 mg/dL (ref >39.00)
LDL Cholesterol: 113 mg/dL — ABNORMAL HIGH (ref 0–99)
NonHDL: 134.09
Total CHOL/HDL Ratio: 3
Triglycerides: 105 mg/dL (ref 0.0–149.0)
VLDL: 21 mg/dL (ref 0.0–40.0)

## 2017-01-02 NOTE — Telephone Encounter (Signed)
Completed as much as possible; forwarded to provider/SLS 12/04

## 2017-01-03 NOTE — Telephone Encounter (Signed)
Form signed and faxed to [place] at [phone]. Form sent for scanning. Received fax confirmation.

## 2017-03-30 ENCOUNTER — Other Ambulatory Visit: Payer: Self-pay | Admitting: Internal Medicine

## 2017-06-07 ENCOUNTER — Other Ambulatory Visit: Payer: Self-pay | Admitting: Internal Medicine

## 2017-09-04 ENCOUNTER — Ambulatory Visit: Payer: Managed Care, Other (non HMO) | Admitting: Internal Medicine

## 2017-10-09 ENCOUNTER — Ambulatory Visit: Payer: Managed Care, Other (non HMO) | Admitting: Internal Medicine

## 2017-10-18 ENCOUNTER — Telehealth: Payer: Self-pay | Admitting: Internal Medicine

## 2017-10-18 DIAGNOSIS — I10 Essential (primary) hypertension: Secondary | ICD-10-CM

## 2017-10-18 MED ORDER — LOSARTAN POTASSIUM 50 MG PO TABS: 50.0000 mg | ORAL_TABLET | Freq: Every day | ORAL | 2 refills | Status: DC

## 2017-10-18 NOTE — Telephone Encounter (Signed)
Options: Use the same pharmacy, send losartan 100 mg and take half tablet daily Use our pharmacy and use losartan 50 mg daily.

## 2017-10-18 NOTE — Telephone Encounter (Signed)
Author phoned pt. to notify that losartan rx was re-sent to [facility] pharmacy, as they have losartan 50mg  available (verified over the phone), and that is pt's preferred pharmacy.  Author left detailed VM.

## 2017-10-18 NOTE — Telephone Encounter (Signed)
Please advise 

## 2017-10-18 NOTE — Telephone Encounter (Signed)
Called pharmacy on file and confirmed medication is on back order. Routing back to provider.

## 2017-10-18 NOTE — Telephone Encounter (Signed)
Copied from [place] [id number]. Topic: Quick Communication - Rx Refill/Question >> Oct 18, 2017 11:18 AM Reyne Dumas L wrote: Medication:  losartan (COZAAR) 50 MG tablet  Has the patient contacted their pharmacy? Yes - states this medication is on back order.  Pt states that he is completely out of medication and wants to know what to do. (Agent: If no, request that the patient contact the pharmacy for the refill.) (Agent: If yes, when and what did the pharmacy advise?)  Preferred Pharmacy (with phone number or street name): [id number] [place], [place] [address] [phone] (Phone) [phone] (Fax)  Agent: Please be advised that RX refills may take up to 3 business days. We ask that you follow-up with your pharmacy.

## 2018-04-03 ENCOUNTER — Ambulatory Visit (INDEPENDENT_AMBULATORY_CARE_PROVIDER_SITE_OTHER): Payer: Managed Care, Other (non HMO) | Admitting: Internal Medicine

## 2018-04-03 ENCOUNTER — Other Ambulatory Visit: Payer: Self-pay

## 2018-04-03 ENCOUNTER — Encounter: Payer: Self-pay | Admitting: Internal Medicine

## 2018-04-03 VITALS — BP 122/82 | HR 110 | Temp 98.7°F | Ht 73.0 in | Wt 317.8 lb

## 2018-04-03 DIAGNOSIS — J019 Acute sinusitis, unspecified: Secondary | ICD-10-CM | POA: Diagnosis not present

## 2018-04-03 MED ORDER — AMOXICILLIN-POT CLAVULANATE 875-125 MG PO TABS
1.0000 | ORAL_TABLET | Freq: Two times a day (BID) | ORAL | 0 refills | Status: DC
Start: 2018-04-03 — End: 2018-12-03

## 2018-04-03 NOTE — Progress Notes (Signed)
Subjective:    Patient ID: Kevin Baxter, male    DOB: Feb 16, 1970, 48 y.o.   MRN: [id number]  DOS:  04/03/2018 Type of visit - description: Acute visit, last seen 12/2016 His son was diagnosed with the flu 2 weeks ago. The patient himself started with symptoms a little over a week ago: Sinus congestion, ear puffiness, some amount of cough, chills. Has taking OTCs including "Tylenol sinus " Symptoms are on and off.  Wt Readings from Last 3 Encounters:  04/03/18 (!) 317 lb 12.8 oz (144.2 kg)  01/01/17 (!) 311 lb (141.1 kg)  05/17/16 (!) 303 lb 4 oz (137.6 kg)     Review of Systems Denies fever, T-max was 97.0 He has postnasal dripping and a lot of mucus accumulated in the throat, "rusty" in color.  No blood. Some chest congestion. No nausea, vomiting.  No rash  Past Medical History:  Diagnosis Date  . Allergy   . Gout   . Hypertension     Past Surgical History:  Procedure Laterality Date  . HAND SURGERY Right    2006 for a FX    Social History   Socioeconomic History  . Marital status: Married    Spouse name: Not on file  . Number of children: 2  . Years of education: Not on file  . Highest education level: Not on file  Occupational History  . Occupation: works for [employer]  . Financial resource strain: Not on file  . Food insecurity:    Worry: Not on file    Inability: Not on file  . Transportation needs:    Medical: Not on file    Non-medical: Not on file  Tobacco Use  . Smoking status: Never Smoker  . Smokeless tobacco: Never Used  Substance and Sexual Activity  . Alcohol use: Yes    Comment: very rare   . Drug use: No  . Sexual activity: Not on file  Lifestyle  . Physical activity:    Days per week: Not on file    Minutes per session: Not on file  . Stress: Not on file  Relationships  . Social connections:    Talks on phone: Not on file    Gets together: Not on file    Attends religious service: Not on file    Active member  of club or organization: Not on file    Attends meetings of clubs or organizations: Not on file    Relationship status: Not on file  . Intimate partner violence:    Fear of current or ex partner: Not on file    Emotionally abused: Not on file    Physically abused: Not on file    Forced sexual activity: Not on file  Other Topics Concern  . Not on file  Social History Narrative   Household: pt, wife daughter (2013),  boy  (2006),       Allergies as of 04/03/2018      Reactions   Pseudoephedrine    REACTION: jittery      Medication List       Accurate as of April 03, 2018 10:49 AM. Always use your most recent med list.        allopurinol 300 MG tablet Commonly known as:  ZYLOPRIM Take 1 tablet (300 mg total) by mouth daily.   losartan 50 MG tablet Commonly known as:  COZAAR Take 1 tablet (50 mg total) by mouth daily.  Objective:   Physical Exam BP 122/82 (BP Location: Left Arm, Patient Position: Sitting, Cuff Size: Large)   Pulse (!) 110   Temp 98.7 F (37.1 C) (Oral)   Ht 6\' 1"  (1.854 m)   Wt (!) 317 lb 12.8 oz (144.2 kg)   SpO2 95%   BMI 41.93 kg/m  General:   Well developed, NAD, BMI noted.  Slightly tachycardic, heart rate rechecked by me: 100.  Nontoxic-appearing. HEENT:  Normocephalic . Face symmetric, atraumatic. TM on the right: Slightly red minimally bulged.  TM on the left normal. Throat symmetric.  Sinuses no TTP.  Nose congested Lungs:  CTA B Normal respiratory effort, no intercostal retractions, no accessory muscle use. Heart: RRR,  no murmur.  No pretibial edema bilaterally  Skin: Not pale. Not jaundice Neurologic:  alert & oriented X3.  Speech normal, gait appropriate for age and unassisted Psych--  Cognition and judgment appear intact.  Cooperative with normal attention span and concentration.  Behavior appropriate. No anxious or depressed appearing.      Assessment    Assessment: Prediabetes since 2004, a1c 6.2   HTN Gout Morbid obesity  ED Asthma as a child  PLAN: Sinusitis: Patient started with respiratory symptoms more than a week ago, suspect he probably had the flu.  Now presents with sinus congestion, suspect sinusitis, possibly bronchitis.  No evidence of pneumonia on clinical grounds. Recommend supportive treatment with Mucinex DM Robitussin-DM and Flonase.  Avoid decongestant.  Start Augmentin. Call if not better.  See AVS.   Due for a physical, strongly encouraged to come back.

## 2018-04-03 NOTE — Patient Instructions (Addendum)
  Please schedule a physical exam at your earliest convenience.    Rest, fluids , tylenol  For cough:  Take Mucinex DM or Robitussin DM as needed until better  For nasal congestion: Use OTC  Flonase : 2 nasal sprays on each side of the nose in the morning until you feel better     Avoid decongestants such as  Pseudoephedrine or phenylephrine    Take the antibiotic as prescribed  (Augmentin)  Call if not gradually better over the next  5-6  days  Call anytime if the symptoms are severe, you have high fever, short of breath, chest pain

## 2018-04-03 NOTE — Progress Notes (Signed)
Pre visit review using our clinic review tool, if applicable. No additional management support is needed unless otherwise documented below in the visit note. 

## 2018-04-04 NOTE — Assessment & Plan Note (Signed)
Sinusitis: Patient started with respiratory symptoms more than a week ago, suspect he probably had the flu.  Now presents with sinus congestion, suspect sinusitis, possibly bronchitis.  No evidence of pneumonia on clinical grounds. Recommend supportive treatment with Mucinex DM Robitussin-DM and Flonase.  Avoid decongestant.  Start Augmentin. Call if not better.  See AVS.   Due for a physical, strongly encouraged to come back.

## 2018-04-27 ENCOUNTER — Other Ambulatory Visit: Payer: Self-pay | Admitting: Internal Medicine

## 2018-06-05 ENCOUNTER — Encounter: Payer: Managed Care, Other (non HMO) | Admitting: Internal Medicine

## 2018-08-11 ENCOUNTER — Other Ambulatory Visit: Payer: Self-pay | Admitting: Internal Medicine

## 2018-08-11 DIAGNOSIS — I10 Essential (primary) hypertension: Secondary | ICD-10-CM

## 2018-11-06 ENCOUNTER — Other Ambulatory Visit: Payer: Self-pay | Admitting: Internal Medicine

## 2018-11-06 DIAGNOSIS — I10 Essential (primary) hypertension: Secondary | ICD-10-CM

## 2018-11-06 MED ORDER — LOSARTAN POTASSIUM 50 MG PO TABS: 50.0000 mg | ORAL_TABLET | Freq: Every day | ORAL | 0 refills | Status: DC

## 2018-11-06 NOTE — Telephone Encounter (Signed)
Medication Refill - Medication:losartan (COZAAR) 50 MG tablet    Has the patient contacted their pharmacy?   Preferred Pharmacy (with phone number or street [id number] [place], [place] [address]  Phone: [phone] Fax: [phone]  Not a 24 hour pharmacy; exact hours not known     Agent: Please be advised that RX refills may take up to 3 business days. We ask that you follow-up with your pharmacy.

## 2018-11-06 NOTE — Telephone Encounter (Signed)
Requested medication (s) are due for refill today: yes  Requested medication (s) are on the active medication list: yes  Last refill:  08/11/2018  Future visit scheduled: yes  Notes to clinic: review for refill   Requested Prescriptions  Pending Prescriptions Disp Refills   losartan (COZAAR) 50 MG tablet 90 tablet 0    Sig: Take 1 tablet (50 mg total) by mouth daily.     Cardiovascular:  Angiotensin Receptor Blockers Failed - 11/06/2018  9:28 AM      Failed - Cr in normal range and within 180 days    Creatinine, Ser  Date Value Ref Range Status  01/01/2017 1.16 0.40 - 1.50 mg/dL Final         Failed - K in normal range and within 180 days    Potassium  Date Value Ref Range Status  01/01/2017 4.0 3.5 - 5.1 mEq/L Final         Failed - Valid encounter within last 6 months    Recent Outpatient Visits          7 months ago Acute non-recurrent sinusitis, unspecified location   [facility] [place], [place]   1 year ago Encounter for general adult medical examination with abnormal findings   [facility] [place], [place]   2 years ago Essential hypertension   [facility] [place], MD   3 years ago Morbid obesity, unspecified obesity type [facility])   [facility] [place], MD   4 years ago Essential hypertension   [facility] [place], [place]      Future Appointments            In 3 weeks [facility] [place], [facility], [place] - Patient is not pregnant      Passed - Last BP in normal range    BP Readings from Last 1 Encounters:  04/03/18 122/82

## 2018-11-07 ENCOUNTER — Telehealth: Payer: Self-pay | Admitting: Internal Medicine

## 2018-11-07 MED ORDER — TADALAFIL 20 MG PO TABS: ORAL_TABLET | ORAL | 0 refills | Status: DC

## 2018-11-07 NOTE — Telephone Encounter (Signed)
Pt is calling and would like new rx [facility]ialis. [facility] [place] [address]

## 2018-11-07 NOTE — Telephone Encounter (Signed)
Prescription sent. Has a ov scheduled 12/03/2018

## 2018-11-07 NOTE — Telephone Encounter (Signed)
Please advise- Cialis hasn't been refilled since 2016- no longer on med list. Pt has appt on 12/03/2018.

## 2018-11-15 ENCOUNTER — Other Ambulatory Visit: Payer: Self-pay | Admitting: Internal Medicine

## 2018-11-15 DIAGNOSIS — I10 Essential (primary) hypertension: Secondary | ICD-10-CM

## 2018-12-02 ENCOUNTER — Other Ambulatory Visit: Payer: Self-pay

## 2018-12-03 ENCOUNTER — Ambulatory Visit (INDEPENDENT_AMBULATORY_CARE_PROVIDER_SITE_OTHER): Payer: Managed Care, Other (non HMO) | Admitting: Internal Medicine

## 2018-12-03 ENCOUNTER — Other Ambulatory Visit: Payer: Self-pay

## 2018-12-03 ENCOUNTER — Encounter: Payer: Self-pay | Admitting: Internal Medicine

## 2018-12-03 VITALS — BP 158/93 | HR 54 | Temp 97.6°F | Resp 16 | Ht 73.0 in | Wt 323.4 lb

## 2018-12-03 DIAGNOSIS — R739 Hyperglycemia, unspecified: Secondary | ICD-10-CM

## 2018-12-03 DIAGNOSIS — I517 Cardiomegaly: Secondary | ICD-10-CM | POA: Diagnosis not present

## 2018-12-03 DIAGNOSIS — Z0000 Encounter for general adult medical examination without abnormal findings: Secondary | ICD-10-CM | POA: Diagnosis not present

## 2018-12-03 DIAGNOSIS — I10 Essential (primary) hypertension: Secondary | ICD-10-CM | POA: Diagnosis not present

## 2018-12-03 DIAGNOSIS — R9431 Abnormal electrocardiogram [ECG] [EKG]: Secondary | ICD-10-CM

## 2018-12-03 DIAGNOSIS — Z125 Encounter for screening for malignant neoplasm of prostate: Secondary | ICD-10-CM | POA: Diagnosis not present

## 2018-12-03 DIAGNOSIS — M109 Gout, unspecified: Secondary | ICD-10-CM

## 2018-12-03 MED ORDER — LOSARTAN POTASSIUM 100 MG PO TABS: 100.0000 mg | ORAL_TABLET | Freq: Every day | ORAL | 1 refills | Status: DC

## 2018-12-03 MED ORDER — TADALAFIL 20 MG PO TABS: 20.0000 mg | ORAL_TABLET | Freq: Every day | ORAL | 3 refills | Status: DC | PRN

## 2018-12-03 NOTE — Progress Notes (Signed)
Subjective:    Patient ID: Kevin Baxter, male    DOB: 10/03/1970, 48 y.o.   MRN: [id number]  DOS:  12/03/2018 Type of visit - description: CPX In general feeling well. Since the quarantine started earlier this year he is working from home, that is very stressful to him. Ambulatory BPs are typically in the 130s over 80s but from time to time they have been high.  He thinks is due to stress.  Good compliance to medication.   Wt Readings from Last 3 Encounters:  12/03/18 (!) 323 lb 6 oz (146.7 kg)  04/03/18 (!) 317 lb 12.8 oz (144.2 kg)  01/01/17 (!) 311 lb (141.1 kg)     Review of Systems  Other than above, a 14 point review of systems is negative    Past Medical History:  Diagnosis Date  . Allergy   . Gout   . Hypertension     Past Surgical History:  Procedure Laterality Date  . HAND SURGERY Right    2006 for a FX    Social History   Socioeconomic History  . Marital status: Married    Spouse name: Not on file  . Number of children: 2  . Years of education: Not on file  . Highest education level: Not on file  Occupational History  . Occupation: works for [employer]  . Financial resource strain: Not on file  . Food insecurity    Worry: Not on file    Inability: Not on file  . Transportation needs    Medical: Not on file    Non-medical: Not on file  Tobacco Use  . Smoking status: Never Smoker  . Smokeless tobacco: Never Used  Substance and Sexual Activity  . Alcohol use: Yes    Comment: very rare   . Drug use: No  . Sexual activity: Not on file  Lifestyle  . Physical activity    Days per week: Not on file    Minutes per session: Not on file  . Stress: Not on file  Relationships  . Social Herbalist on phone: Not on file    Gets together: Not on file    Attends religious service: Not on file    Active member of club or organization: Not on file    Attends meetings of clubs or organizations: Not on file    Relationship  status: Not on file  . Intimate partner violence    Fear of current or ex partner: Not on file    Emotionally abused: Not on file    Physically abused: Not on file    Forced sexual activity: Not on file  Other Topics Concern  . Not on file  Social History Narrative   Household: pt, wife daughter (2013),  boy  (2006),      Family History  Problem Relation Age of Onset  . Cancer Mother        CERVICAL  . Hypertension Father   . Heart failure Father        no MI, d/t etoh  . Diabetes Other   . Colon cancer Neg Hx   . Prostate cancer Neg Hx   . Stroke Neg Hx      Allergies as of 12/03/2018      Reactions   Pseudoephedrine    REACTION: jittery      Medication List       Accurate as of December 03, 2018 11:59 PM. If  you have any questions, ask your nurse or doctor.        STOP taking these medications   amoxicillin-clavulanate 875-125 MG tablet Commonly known as: Augmentin Stopped by: Kathlene November, MD     TAKE these medications   allopurinol 300 MG tablet Commonly known as: ZYLOPRIM Take 1 tablet (300 mg total) by mouth daily.   losartan 100 MG tablet Commonly known as: COZAAR Take 1 tablet (100 mg total) by mouth daily. What changed:   medication strength  how much to take Changed by: Kathlene November, MD   tadalafil 20 MG tablet Commonly known as: Cialis Take 1 tablet (20 mg total) by mouth daily as needed for erectile dysfunction. What changed:   how much to take  how to take this  when to take this  reasons to take this  additional instructions Changed by: Kathlene November, MD           Objective:   Physical Exam BP (!) 158/93 (BP Location: Left Arm, Patient Position: Sitting, Cuff Size: Normal)   Pulse (!) 54   Temp 97.6 F (36.4 C) (Temporal)   Resp 16   Ht 6\' 1"  (1.854 m)   Wt (!) 323 lb 6 oz (146.7 kg)   SpO2 94%   BMI 42.66 kg/m  General: Well developed, NAD, BMI noted Neck: No  thyromegaly  HEENT:  Normocephalic . Face symmetric,  atraumatic Lungs:  CTA B Normal respiratory effort, no intercostal retractions, no accessory muscle use. Heart: RRR,  no murmur.  No pretibial edema bilaterally  Abdomen:  Not distended, soft, non-tender. No rebound or rigidity.   Skin: Exposed areas without rash. Not pale. Not jaundice DRE: Normal sphincter tone, quite limited, very difficult to reach the prostate.  Seems normal though. Neurologic:  alert & oriented X3.  Speech normal, gait appropriate for age and unassisted Strength symmetric and appropriate for age.  Psych: Cognition and judgment appear intact.  Cooperative with normal attention span and concentration.  Behavior appropriate. No anxious or depressed appearing.     Assessment     Assessment: Prediabetes since 2004, a1c 6.2  HTN Gout Morbid obesity  ED Asthma as a child  PLAN: For CPX Prediabetes: Check a A1c, anticipate it will be higher than before. HTN:  good compliance with losartan, ambulatory BPs typically 130/80, occasionally elevated.  Today it was elevated upon arrival, I rechecked it: 155/95.   EKG today: Mild tachycardia, no acute changes, some changes suggest  LVH   Plan: Check echo, increase losartan to 100 mg daily.  Nurse visit 1 month: BMP, BP check Gout: On allopurinol, no recent events Morbid obesity: Counseled, calorie counting, wellness clinic?  Information provided ED: Request the branded Cialis.  Will do. RTC 6 months  Today in addition to the physical exam, I spent more than   15 min with the patient: >50% of the time counseling regards his chronic medical problems including prediabetes, HTN, gout, morbid obesity, ADD.

## 2018-12-03 NOTE — Progress Notes (Signed)
Pre visit review using our clinic review tool, if applicable. No additional management support is needed unless otherwise documented below in the visit note. 

## 2018-12-03 NOTE — Patient Instructions (Addendum)
GO TO THE LAB : Get the blood work     GO TO THE FRONT DESK Schedule your next appointment for a nurse visit in 1 month, blood pressure check and blood work.  Next visit with me in 6 months  Increase losartan to 100 mg tablets, prescription sent.   Check your blood pressure twice a week BP GOAL is between 110/65 and  135/85. If it is consistently higher or lower, let me know      Diet: Portion control, calorie counting?  Visit the wellness clinic?

## 2018-12-04 ENCOUNTER — Other Ambulatory Visit: Payer: Self-pay | Admitting: Internal Medicine

## 2018-12-04 LAB — CBC WITH DIFFERENTIAL/PLATELET
Basophils Absolute: 0 10*3/uL (ref 0.0–0.1)
Basophils Relative: 0.6 % (ref 0.0–3.0)
Eosinophils Absolute: 0.2 10*3/uL (ref 0.0–0.7)
Eosinophils Relative: 3.1 % (ref 0.0–5.0)
HCT: 54.6 % — ABNORMAL HIGH (ref 39.0–52.0)
Hemoglobin: 17.7 g/dL — ABNORMAL HIGH (ref 13.0–17.0)
Lymphocytes Relative: 34.6 % (ref 12.0–46.0)
Lymphs Abs: 2.1 10*3/uL (ref 0.7–4.0)
MCHC: 32.4 g/dL (ref 30.0–36.0)
MCV: 79.6 fl (ref 78.0–100.0)
Monocytes Absolute: 0.5 10*3/uL (ref 0.1–1.0)
Monocytes Relative: 8.8 % (ref 3.0–12.0)
Neutro Abs: 3.2 10*3/uL (ref 1.4–7.7)
Neutrophils Relative %: 52.9 % (ref 43.0–77.0)
Platelets: 235 10*3/uL (ref 150.0–400.0)
RBC: 6.86 Mil/uL — ABNORMAL HIGH (ref 4.22–5.81)
RDW: 16.9 % — ABNORMAL HIGH (ref 11.5–15.5)
WBC: 6.1 10*3/uL (ref 4.0–10.5)

## 2018-12-04 LAB — COMPREHENSIVE METABOLIC PANEL
ALT: 16 U/L (ref 0–53)
AST: 15 U/L (ref 0–37)
Albumin: 4.4 g/dL (ref 3.5–5.2)
Alkaline Phosphatase: 69 U/L (ref 39–117)
BUN: 12 mg/dL (ref 6–23)
CO2: 29 mEq/L (ref 19–32)
Calcium: 9.8 mg/dL (ref 8.4–10.5)
Chloride: 103 mEq/L (ref 96–112)
Creatinine, Ser: 1.24 mg/dL (ref 0.40–1.50)
GFR: 74.65 mL/min (ref >60.00)
Glucose, Bld: 94 mg/dL (ref 70–99)
Potassium: 4.5 mEq/L (ref 3.5–5.1)
Sodium: 141 mEq/L (ref 135–145)
Total Bilirubin: 0.9 mg/dL (ref 0.2–1.2)
Total Protein: 7.4 g/dL (ref 6.0–8.3)

## 2018-12-04 LAB — LIPID PANEL
Cholesterol: 188 mg/dL (ref 0–200)
HDL: 50.1 mg/dL (ref >39.00)
LDL Cholesterol: 108 mg/dL — ABNORMAL HIGH (ref 0–99)
NonHDL: 137.41
Total CHOL/HDL Ratio: 4
Triglycerides: 149 mg/dL (ref 0.0–149.0)
VLDL: 29.8 mg/dL (ref 0.0–40.0)

## 2018-12-04 LAB — HEMOGLOBIN A1C: Hgb A1c MFr Bld: 6.2 % (ref 4.6–6.5)

## 2018-12-04 LAB — PSA: PSA: 1.27 ng/mL (ref 0.10–4.00)

## 2018-12-04 LAB — TSH: TSH: 1.59 u[IU]/mL (ref 0.35–4.50)

## 2018-12-04 NOTE — Assessment & Plan Note (Signed)
For CPX Prediabetes: Check a A1c, anticipate it will be higher than before. HTN:  good compliance with losartan, ambulatory BPs typically 130/80, occasionally elevated.  Today it was elevated upon arrival, I rechecked it: 155/95.   EKG today: Mild tachycardia, no acute changes, some changes suggest  LVH   Plan: Check echo, increase losartan to 100 mg daily.  Nurse visit 1 month: BMP, BP check Gout: On allopurinol, no recent events Morbid obesity: Counseled, calorie counting, wellness clinic?  Information provided ED: Request the branded Cialis.  Will do. RTC 6 months

## 2018-12-04 NOTE — Assessment & Plan Note (Signed)
-   Td 2015 -Shingrix will d/w next year -declined flu shot  - CCS: 3 modalities discussed -prostate ca screening: DRE limited but negative, check a PSA.  No symptoms -Diet and exercise: Admits he is not eating healthy, has gained weight.  Not exercising much.  Counseled -Labs: CMP, CBC, FLP, A1c, TSH, PSA

## 2019-01-01 ENCOUNTER — Other Ambulatory Visit: Payer: Self-pay

## 2019-01-01 ENCOUNTER — Ambulatory Visit (INDEPENDENT_AMBULATORY_CARE_PROVIDER_SITE_OTHER): Payer: Managed Care, Other (non HMO) | Admitting: Internal Medicine

## 2019-01-01 VITALS — BP 144/93 | HR 102

## 2019-01-01 DIAGNOSIS — I10 Essential (primary) hypertension: Secondary | ICD-10-CM | POA: Diagnosis not present

## 2019-01-01 NOTE — Progress Notes (Signed)
Pt here for Blood pressure check per Dr. Larose Kells.  Pt currently takes: losartan 50mg . Patient only taken 2 doses of the losartan 100mg  then he went back to 50 mg daily.  He has not been checking at home.   BP today  Right arm 158/94  HR 108 Left arm 156/98 HR 103  Recheck Right arm 155/89  HR 106 Left arm 144/93 HR 102  Pt advised per Dr. Larose Kells to take the losartan 100mg  daily, check blood pressure at home and bring in readings to nurse visit in [redacted] weeks along with lab visit  (bmp) and bp check in 2 weeks.   This visit occurred during the SARS-CoV-2 public health emergency.  Safety protocols were in place, including screening questions prior to the visit, additional usage of staff PPE, and extensive cleaning of exam room while observing appropriate contact time as indicated for disinfecting solutions.    Kathlene November, MD

## 2019-01-22 ENCOUNTER — Ambulatory Visit: Payer: Managed Care, Other (non HMO)

## 2019-01-22 ENCOUNTER — Other Ambulatory Visit: Payer: Managed Care, Other (non HMO)

## 2019-02-23 ENCOUNTER — Other Ambulatory Visit: Payer: Self-pay | Admitting: Internal Medicine

## 2019-03-05 ENCOUNTER — Encounter: Payer: Self-pay | Admitting: Emergency Medicine

## 2019-03-05 ENCOUNTER — Other Ambulatory Visit: Payer: Self-pay

## 2019-03-05 ENCOUNTER — Emergency Department
Admission: EM | Admit: 2019-03-05 | Discharge: 2019-03-05 | Disposition: A | Discharge status: Home / Self Care | Payer: 59 | Source: Home / Self Care | Attending: Family Medicine | Admitting: Family Medicine

## 2019-03-05 DIAGNOSIS — J0100 Acute maxillary sinusitis, unspecified: Secondary | ICD-10-CM

## 2019-03-05 MED ORDER — AMOXICILLIN 875 MG PO TABS: 875.0000 mg | ORAL_TABLET | Freq: Two times a day (BID) | ORAL | 0 refills | Status: DC

## 2019-03-05 NOTE — ED Triage Notes (Signed)
Nasal congestion, nausea, reddish tint x 1 week

## 2019-03-05 NOTE — ED Provider Notes (Signed)
[facility]    CSN: [id number] Arrival date & time: 03/05/19  1248      History   Chief Complaint Chief Complaint  Patient presents with  . Nasal Congestion    HPI Kevin Baxter is a 49 y.o. male.   Patient has a history of seasonal rhinitis.  During the past 1.5 weeks he has had increased sinus congestion with recent development of pain in his left upper teeth and facial pressure.  He has had rust colored nasal drainage.  The history is provided by the patient.    Past Medical History:  Diagnosis Date  . Allergy   . Gout   . Hypertension     Patient Active Problem List   Diagnosis Date Noted  . Morbid obesity ([place]) 04/22/2015  . PCP NOTES >>>>>>>>>>>>>>>>>>>>>>>>> 04/22/2015  . Hyperglycemia 03/11/2014  . Erectile dysfunction 07/21/2011  . Annual physical exam 01/05/2011  . Gout 03/14/2006  . Essential hypertension 03/14/2006  . ALLERGIC RHINITIS 03/14/2006    Past Surgical History:  Procedure Laterality Date  . HAND SURGERY Right    2006 for a FX       Home Medications    Prior to Admission medications   Medication Sig Start Date End Date Taking? Authorizing Provider  allopurinol (ZYLOPRIM) 300 MG tablet Take 1 tablet (300 mg total) by mouth daily. 02/24/19   Colon Branch, MD  amoxicillin (AMOXIL) 875 MG tablet Take 1 tablet (875 mg total) by mouth 2 (two) times daily. 03/05/19   Kandra Nicolas, MD  losartan (COZAAR) 100 MG tablet Take 1 tablet (100 mg total) by mouth daily. 12/03/18   Colon Branch, MD  tadalafil (CIALIS) 20 MG tablet Take 1 tablet (20 mg total) by mouth daily as needed for erectile dysfunction. 12/03/18   Colon Branch, MD    Family History Family History  Problem Relation Age of Onset  . Cancer Mother        CERVICAL  . Hypertension Father   . Heart failure Father        no MI, d/t etoh  . Diabetes Other   . Colon cancer Neg Hx   . Prostate cancer Neg Hx   . Stroke Neg Hx     Social History Social History    Tobacco Use  . Smoking status: Never Smoker  . Smokeless tobacco: Never Used  Substance Use Topics  . Alcohol use: Yes    Comment: very rare   . Drug use: No     Allergies   Pseudoephedrine   Review of Systems Review of Systems No sore throat No cough No pleuritic pain No wheezing + nasal congestion + post-nasal drainage + sinus pain/pressure No itchy/red eyes ? earache No hemoptysis No SOB No fever/chills + nausea No vomiting No abdominal pain No diarrhea No urinary symptoms No skin rash + fatigue No myalgia No headache    Physical Exam Triage Vital Signs ED Triage Vitals [03/05/19 1439]  Enc Vitals Group     BP (!) 114/101     Pulse Rate (!) 114     Resp      Temp 98.1 F (36.7 C)     Temp Source Oral     SpO2 97 %     Weight (!) 319 lb (144.7 kg)     Height 6\' 2"  (1.88 m)     Head Circumference      Peak Flow      Pain Score 0  Pain Loc      Pain Edu?      Excl. in [place]?    No data found.  Updated Vital Signs BP (!) 114/101 (BP Location: Right Arm)   Pulse (!) 114   Temp 98.1 F (36.7 C) (Oral)   Ht 6\' 2"  (1.88 m)   Wt (!) 144.7 kg   SpO2 97%   BMI 40.96 kg/m   Visual Acuity Right Eye Distance:   Left Eye Distance:   Bilateral Distance:    Right Eye Near:   Left Eye Near:    Bilateral Near:     Physical Exam Nursing notes and Vital Signs reviewed. Appearance:  Patient appears stated age, and in no acute distress Eyes:  Pupils are equal, round, and reactive to light and accomodation.  Extraocular movement is intact.  Conjunctivae are not inflamed  Ears:  Canals normal.  Tympanic membranes normal.  Nose:  Congested turbinates.  Left maxillary sinus tenderness is present.  Pharynx:  Normal Neck:  Supple. No adenopathy. Lungs:  Clear to auscultation.  Breath sounds are equal.  Moving air well. Heart:  Regular rate and rhythm without murmurs, rubs, or gallops.  Abdomen:  Nontender without masses or hepatosplenomegaly.   Bowel sounds are present.  No CVA or flank tenderness.  Extremities:  No edema.  Skin:  No rash present.   UC Treatments / Results  Labs (all labs ordered are listed, but only abnormal results are displayed) Labs Reviewed - No data to display  EKG   Radiology No results found.  Procedures Procedures (including critical care time)  Medications Ordered in UC Medications - No data to display  Initial Impression / Assessment and Plan / UC Course  I have reviewed the triage vital signs and the nursing notes.  Pertinent labs & imaging results that were available during my care of the patient were reviewed by me and considered in my medical decision making (see chart for details).    Begin amoxicillin. Followup ENT if not resolved 2 weeks.    Final Clinical Impressions(s) / UC Diagnoses   Final diagnoses:  Acute maxillary sinusitis, recurrence not specified     Discharge Instructions      May use Afrin nasal spray (or generic oxymetazoline) each morning for about 5 days and then discontinue.  Also recommend using saline nasal spray several times daily and saline nasal irrigation (AYR is a common brand).  Use Flonase nasal spray each morning after using Afrin nasal spray and saline nasal irrigation.      ED Prescriptions    Medication Sig Dispense Auth. Provider   amoxicillin (AMOXIL) 875 MG tablet Take 1 tablet (875 mg total) by mouth 2 (two) times daily. 20 tablet Kandra Nicolas, MD        Kandra Nicolas, MD 03/07/19 [phone]

## 2019-03-05 NOTE — Discharge Instructions (Addendum)
°  May use Afrin nasal spray (or generic oxymetazoline) each morning for about 5 days and then discontinue.  Also recommend using saline nasal spray several times daily and saline nasal irrigation (AYR is a common brand).  Use Flonase nasal spray each morning after using Afrin nasal spray and saline nasal irrigation. °  °

## 2019-03-07 ENCOUNTER — Encounter: Payer: Self-pay | Admitting: Internal Medicine

## 2019-04-14 ENCOUNTER — Telehealth: Payer: Self-pay | Admitting: Internal Medicine

## 2019-04-14 MED ORDER — COLCHICINE 0.6 MG PO TABS: 0.6000 mg | ORAL_TABLET | Freq: Two times a day (BID) | ORAL | 0 refills | Status: DC | PRN

## 2019-04-14 MED ORDER — PREDNISONE 10 MG PO TABS: ORAL_TABLET | ORAL | 0 refills | Status: DC

## 2019-04-14 NOTE — Telephone Encounter (Signed)
Advise patient: I sent  prednisone Also recommend colchicine 0.6 mg 1 tablet twice daily as needed, send #60 no refills.  To be taken until gout symptoms resolved. Also due for a BMP, DX HTN, please arrange

## 2019-04-14 NOTE — Telephone Encounter (Signed)
Please advise 

## 2019-04-14 NOTE — Telephone Encounter (Signed)
Pt Request for a script for predisone. States that he thinks he's having a gout flare up due to a seafood [place] we recently went to. I offered him an appointment since it's been a while patient was prescribed meds. He declined both in person and virtual and just wanted meds. Please Advise

## 2019-04-14 NOTE — Telephone Encounter (Signed)
Spoke w/ Pt- informed of recommendations. Colchicine sent to pharmacy. Pt unable to come to lab until first week of April d/t work issues. Lab appt scheduled. BMP already pended.

## 2019-04-22 ENCOUNTER — Encounter: Payer: Self-pay | Admitting: Internal Medicine

## 2019-04-22 ENCOUNTER — Other Ambulatory Visit (INDEPENDENT_AMBULATORY_CARE_PROVIDER_SITE_OTHER): Payer: 59

## 2019-04-22 ENCOUNTER — Ambulatory Visit: Payer: 59 | Admitting: Internal Medicine

## 2019-04-22 VITALS — BP 144/88 | HR 100 | Temp 97.8°F | Ht 73.0 in | Wt 323.8 lb

## 2019-04-22 DIAGNOSIS — R1013 Epigastric pain: Secondary | ICD-10-CM

## 2019-04-22 DIAGNOSIS — R140 Abdominal distension (gaseous): Secondary | ICD-10-CM

## 2019-04-22 DIAGNOSIS — R12 Heartburn: Secondary | ICD-10-CM | POA: Diagnosis not present

## 2019-04-22 DIAGNOSIS — F439 Reaction to severe stress, unspecified: Secondary | ICD-10-CM

## 2019-04-22 DIAGNOSIS — R197 Diarrhea, unspecified: Secondary | ICD-10-CM | POA: Diagnosis not present

## 2019-04-22 DIAGNOSIS — Z01818 Encounter for other preprocedural examination: Secondary | ICD-10-CM

## 2019-04-22 LAB — CBC WITH DIFFERENTIAL/PLATELET
Basophils Absolute: 0 10*3/uL (ref 0.0–0.1)
Basophils Relative: 0.5 % (ref 0.0–3.0)
Eosinophils Absolute: 0.3 10*3/uL (ref 0.0–0.7)
Eosinophils Relative: 4.9 % (ref 0.0–5.0)
HCT: 52.1 % — ABNORMAL HIGH (ref 39.0–52.0)
Hemoglobin: 17.2 g/dL — ABNORMAL HIGH (ref 13.0–17.0)
Lymphocytes Relative: 31.5 % (ref 12.0–46.0)
Lymphs Abs: 1.8 10*3/uL (ref 0.7–4.0)
MCHC: 33 g/dL (ref 30.0–36.0)
MCV: 79 fl (ref 78.0–100.0)
Monocytes Absolute: 0.5 10*3/uL (ref 0.1–1.0)
Monocytes Relative: 8.5 % (ref 3.0–12.0)
Neutro Abs: 3 10*3/uL (ref 1.4–7.7)
Neutrophils Relative %: 54.6 % (ref 43.0–77.0)
Platelets: 244 10*3/uL (ref 150.0–400.0)
RBC: 6.59 Mil/uL — ABNORMAL HIGH (ref 4.22–5.81)
RDW: 16.7 % — ABNORMAL HIGH (ref 11.5–15.5)
WBC: 5.6 10*3/uL (ref 4.0–10.5)

## 2019-04-22 LAB — C-REACTIVE PROTEIN: CRP: 1.9 mg/dL (ref 0.5–20.0)

## 2019-04-22 LAB — SEDIMENTATION RATE: Sed Rate: 45 mm/hr — ABNORMAL HIGH (ref 0–20)

## 2019-04-22 LAB — IGA: IgA: 451 mg/dL — ABNORMAL HIGH (ref 68–378)

## 2019-04-22 MED ORDER — DICYCLOMINE HCL 20 MG PO TABS: 20.0000 mg | ORAL_TABLET | Freq: Four times a day (QID) | ORAL | 0 refills | Status: DC | PRN

## 2019-04-22 NOTE — Patient Instructions (Signed)
You have been scheduled for an endoscopy and colonoscopy. Please follow the written instructions given to you at your visit today. Please pick up your prep supplies at the pharmacy within the next 1-3 days. If you use inhalers (even only as needed), please bring them with you on the day of your procedure.   Normal BMI (Body Mass Index- based on height and weight) is between 19 and 25. Your BMI today is Body mass index is 42.72 kg/m. Marland Kitchen Please consider follow up  regarding your BMI with your Primary Care Provider.   Your provider has requested that you go to the basement level for lab work before leaving today. Press "B" on the elevator. The lab is located at the first door on the left as you exit the elevator.   We have sent the following medications to your pharmacy for you to pick up at your convenience: Dicyclomine    I appreciate the opportunity to care for you. Silvano Rusk, MD, [facility]

## 2019-04-22 NOTE — Progress Notes (Signed)
Kevin Baxter 49 y.o. 02/07/1970 [id number]  Assessment & Plan:   Encounter Diagnoses  Name Primary?  . Diarrhea, unspecified type Yes  . Dyspepsia   . Bloating   . Heartburn   . Situational stress    It sounds most like IBS but that is a diagnosis of exclusion.  Will work-up with labs, EGD and colonoscopy.The risks and benefits as well as alternatives of endoscopic procedure(s) have been discussed and reviewed. All questions answered. The patient agrees to proceed.  Treat with dicyclomine as needed for now.  CC: Colon Branch, MD   Subjective:   Chief Complaint: Abdominal pain bloating heartburn/reflux diarrhea  HPI 29 year old African-American man with a 1 year history of intermittent abdominal cramps and diarrhea.  Also complaining of postprandial bloating.  Some heartburn or bitter taste in mouth.  There is a clear association with stress and no obvious dietary trigger and no medication change associated.  Been quite busy and stressful at home he is working from home and his 2 children are at home going on to school and he and his wife both work and are trying to coordinate this and he does associate the changes with that.  For the past 30 days he has been taking Nexium daily and feels a little bit better.  He does not have nocturnal diarrhea.  He has quit drinking beer and he has avoided spicy foods with some help.  He has not tried any medications.  GI review of systems is otherwise negative other than sometimes stools were dark, no bleeding otherwise noted.  He is looking forward to a spring break vacation to [place] soon. Recent gout flare responded to prednisone he did not use colchicine because the prescription was $100 though he can afford it the prednisone typically works so he did not and he is not taking that i.e. that is not causing his diarrhea.  He does have normal bowel movements much of the time.  Prior to this 2 bowel movements formed a day without  difficulty.  Recent primary care notes reviewed labs.  His hemoglobin was up to 17.  Dr. Larose Kells wondered if he could have, the patient does not know if he snores.  Does not have a diagnosis of sleep apnea.  Dr. Larose Kells planned on rechecking the hemoglobin when he returns.   The patient did report that he came at the urging of his wife, he has been somewhat embarrassed by the symptoms and has not been easy for him to bring them up but after the appointment today he was glad he did.  He had not discussed this with Dr. Larose Kells previously. Allergies  Allergen Reactions  . Pseudoephedrine     REACTION: jittery   Current Meds  Medication Sig  . allopurinol (ZYLOPRIM) 300 MG tablet Take 1 tablet (300 mg total) by mouth daily.  Marland Kitchen esomeprazole (NEXIUM) 20 MG capsule Take 20 mg by mouth daily as needed.  Marland Kitchen losartan (COZAAR) 100 MG tablet Take 1 tablet (100 mg total) by mouth daily.  . tadalafil (CIALIS) 20 MG tablet Take 1 tablet (20 mg total) by mouth daily as needed for erectile dysfunction.   Past Medical History:  Diagnosis Date  . Allergy   . Arthritis   . Gout   . Hypertension    Past Surgical History:  Procedure Laterality Date  . HAND SURGERY Right    2006 for a FX   Social History   Social History Narrative   Household:  pt, wife daughter (2013),  boy  (2006),    family history includes Cancer in his mother; Diabetes in his cousin, paternal aunt, sister, and sister; Heart failure in his father; Hypertension in his father.   Review of Systems Some hearing issues anxiety and stress as mentioned joint pain/arthritis all other review systems negative  Objective:   Physical Exam @BP  (!) 144/88   Pulse 100   Temp 97.8 F (36.6 C)   Ht 6\' 1"  (1.854 m)   Wt (!) 323 lb 12.8 oz (146.9 kg)   BMI 42.72 kg/m @  General:  Well-developed, well-nourished and in no acute distress, he is obese Eyes:  anicteric. Neck:   supple w/o thyromegaly or mass.  Lungs: Clear to auscultation  bilaterally. Heart:  S1S2, no rubs, murmurs, gallops. Abdomen:  soft, non-tender, no hepatosplenomegaly, hernia, or mass and BS+.  Rectal: Is deferred Neuro:  A&O x 3.  Psych:  appropriate mood and  Affect.   Data Reviewed: See HPI.

## 2019-04-23 LAB — TISSUE TRANSGLUTAMINASE, IGA: (tTG) Ab, IgA: 1 U/mL

## 2019-05-06 ENCOUNTER — Other Ambulatory Visit: Payer: 59

## 2019-05-07 ENCOUNTER — Other Ambulatory Visit: Payer: Self-pay | Admitting: Internal Medicine

## 2019-05-10 ENCOUNTER — Other Ambulatory Visit: Payer: Self-pay | Admitting: Internal Medicine

## 2019-05-14 ENCOUNTER — Telehealth: Payer: Self-pay | Admitting: Internal Medicine

## 2019-05-14 NOTE — Telephone Encounter (Signed)
I reviewed the results with the patient and all of his questions were answered.

## 2019-05-15 ENCOUNTER — Other Ambulatory Visit: Payer: Self-pay

## 2019-05-20 ENCOUNTER — Other Ambulatory Visit (INDEPENDENT_AMBULATORY_CARE_PROVIDER_SITE_OTHER): Payer: 59

## 2019-05-20 ENCOUNTER — Other Ambulatory Visit: Payer: Self-pay

## 2019-05-20 DIAGNOSIS — I10 Essential (primary) hypertension: Secondary | ICD-10-CM

## 2019-05-21 ENCOUNTER — Other Ambulatory Visit: Payer: Self-pay

## 2019-05-21 LAB — BASIC METABOLIC PANEL
BUN: 14 mg/dL (ref 6–23)
CO2: 29 mEq/L (ref 19–32)
Calcium: 9.7 mg/dL (ref 8.4–10.5)
Chloride: 100 mEq/L (ref 96–112)
Creatinine, Ser: 1.2 mg/dL (ref 0.40–1.50)
GFR: 77.38 mL/min (ref >60.00)
Glucose, Bld: 93 mg/dL (ref 70–99)
Potassium: 4.8 mEq/L (ref 3.5–5.1)
Sodium: 136 mEq/L (ref 135–145)

## 2019-05-22 ENCOUNTER — Ambulatory Visit: Payer: 59 | Admitting: Internal Medicine

## 2019-05-22 ENCOUNTER — Encounter: Payer: Self-pay | Admitting: Internal Medicine

## 2019-05-22 VITALS — BP 163/101 | HR 114 | Temp 97.4°F | Resp 18 | Ht 73.0 in | Wt 328.2 lb

## 2019-05-22 DIAGNOSIS — M109 Gout, unspecified: Secondary | ICD-10-CM

## 2019-05-22 DIAGNOSIS — I10 Essential (primary) hypertension: Secondary | ICD-10-CM | POA: Diagnosis not present

## 2019-05-22 DIAGNOSIS — Z0189 Encounter for other specified special examinations: Secondary | ICD-10-CM | POA: Diagnosis not present

## 2019-05-22 NOTE — Patient Instructions (Signed)
Continue checking your blood pressures at home BP GOAL is between 110/65 and  135/85. If it is consistently higher or lower, let me know  Watch your salt intake  To loose weight you have many options: Weight watchers NOOM A visit to the wellness clinic, see information.  GO TO THE FRONT DESK, PLEASE SCHEDULE YOUR APPOINTMENTS Come back for a checkup in 3 months

## 2019-05-22 NOTE — Progress Notes (Signed)
Pre visit review using our clinic review tool, if applicable. No additional management support is needed unless otherwise documented below in the visit note. 

## 2019-05-22 NOTE — Progress Notes (Signed)
Subjective:    Patient ID: Kevin Baxter, male    DOB: Jan 30, 1971, 49 y.o.   MRN: [id number]  DOS:  05/22/2019 Type of visit - description: Follow-up  Here for hypertension management. Ambulatory BPs at home are in the 130s, today they are elevated, reports she is very anxious.  We also talk about: Elevated hemoglobin and sed rate on recent labs Sleep apnea? Morbid obesity Gout   Wt Readings from Last 3 Encounters:  05/22/19 (!) 328 lb 4 oz (148.9 kg)  04/22/19 (!) 323 lb 12.8 oz (146.9 kg)  03/05/19 (!) 319 lb (144.7 kg)      Review of Systems Admits to some stress. Denies chest pain no difficulty breathing No palpitations Admits to snoring and feeling sleepy often during the mornings.  Past Medical History:  Diagnosis Date  . Allergy   . Arthritis   . Gout   . Hypertension     Past Surgical History:  Procedure Laterality Date  . HAND SURGERY Right    2006 for a FX    Allergies as of 05/22/2019      Reactions   Pseudoephedrine    REACTION: jittery      Medication List       Accurate as of May 22, 2019 11:59 PM. If you have any questions, ask your nurse or doctor.        allopurinol 300 MG tablet Commonly known as: ZYLOPRIM Take 1 tablet (300 mg total) by mouth daily.   colchicine 0.6 MG tablet Take 1 tablet (0.6 mg total) by mouth 2 (two) times daily as needed (gout flare).   dicyclomine 20 MG tablet Commonly known as: BENTYL Take 1 tablet (20 mg total) by mouth every 6 (six) hours as needed for spasms.   esomeprazole 20 MG capsule Commonly known as: NEXIUM Take 20 mg by mouth daily as needed.   losartan 100 MG tablet Commonly known as: COZAAR TAKE 1 TABLET BY MOUTH EVERY DAY   tadalafil 20 MG tablet Commonly known as: Cialis Take 1 tablet (20 mg total) by mouth daily as needed for erectile dysfunction.          Objective:   Physical Exam BP (!) 163/101 (BP Location: Left Arm, Patient Position: Sitting, Cuff Size: Normal)    Pulse (!) 114   Temp (!) 97.4 F (36.3 C) (Temporal)   Resp 18   Ht 6\' 1"  (1.854 m)   Wt (!) 328 lb 4 oz (148.9 kg)   SpO2 99%   BMI 43.31 kg/m  General:   Well developed, NAD, BMI noted. HEENT:  Normocephalic . Face symmetric, atraumatic. Neck: No thyromegaly Lungs:  CTA B Normal respiratory effort, no intercostal retractions, no accessory muscle use. Heart: Tachycardic,  no murmur.  Lower extremities: no pretibial edema bilaterally  Skin: Not pale. Not jaundice Neurologic:  alert & oriented X3.  Speech normal, gait appropriate for age and unassisted Psych--  Cognition and judgment appear intact.  Cooperative with normal attention span and concentration.  Behavior appropriate. No anxious or depressed appearing.      Assessment    Assessment: Prediabetes since 2004, a1c 6.2  HTN Gout Morbid obesity  ED Asthma as a child  PLAN: HTN: BP today is elevated, BP when he went to GI recently: 144/88.  Ambulatory BPs in the 130s. Currently losartan. Last BMP 2 days ago: Normal Last EKG with evidence of LVH, was Rx a echo but did not pursue and declines to reschedule.  He  is extremely anxious about being at the office, he thinks that explains elevated BP and heart rate and that is entirely possible. Plan: no change, reassess in 3 months  Gout: Had a recent attack, linked to a dietary indiscretion.  Asymptomatic now Morbid obesity: Weight continue increasing, rec a formal effort to lose weight: Weight watchers?  NOOM?  Wellness clinic? GI: Abdominal pain, heartburn, diarrhea: Saw GI 04/22/2019, scheduled for EGD and a colonoscopy. OSA: Has morbid obesity, snoring, feels sleepy, Epworth scale 16, quite positive, strongly suspect sleep apnea, refer to cardiology for eval and treatment, cardiovascular risk from untreated OSA discussed. Elevated hemoglobin, increased sed rate: Likely related to OSA, reassess in few months. RTC 3 months    This visit occurred during the  SARS-CoV-2 public health emergency.  Safety protocols were in place, including screening questions prior to the visit, additional usage of staff PPE, and extensive cleaning of exam room while observing appropriate contact time as indicated for disinfecting solutions.

## 2019-05-24 NOTE — Assessment & Plan Note (Signed)
HTN: BP today is elevated, BP when he went to GI recently: 144/88.  Ambulatory BPs in the 130s. Currently losartan. Last BMP 2 days ago: Normal Last EKG with evidence of LVH, was Rx a echo but did not pursue and declines to reschedule.  He is extremely anxious about being at the office, he thinks that explains elevated BP and heart rate and that is entirely possible. Plan: no change, reassess in 3 months  Gout: Had a recent attack, linked to a dietary indiscretion.  Asymptomatic now Morbid obesity: Weight continue increasing, rec a formal effort to lose weight: Weight watchers?  NOOM?  Wellness clinic? GI: Abdominal pain, heartburn, diarrhea: Saw GI 04/22/2019, scheduled for EGD and a colonoscopy. OSA: Has morbid obesity, snoring, feels sleepy, Epworth scale 16, quite positive, strongly suspect sleep apnea, refer to cardiology for eval and treatment, cardiovascular risk from untreated OSA discussed. Elevated hemoglobin, increased sed rate: Likely related to OSA, reassess in few months. RTC 3 months

## 2019-05-30 ENCOUNTER — Other Ambulatory Visit: Payer: Self-pay | Admitting: Internal Medicine

## 2019-05-30 ENCOUNTER — Ambulatory Visit (INDEPENDENT_AMBULATORY_CARE_PROVIDER_SITE_OTHER): Payer: 59

## 2019-05-30 DIAGNOSIS — Z1159 Encounter for screening for other viral diseases: Secondary | ICD-10-CM

## 2019-05-31 HISTORY — PX: COLONOSCOPY: SHX174

## 2019-05-31 HISTORY — PX: ESOPHAGOGASTRODUODENOSCOPY: SHX1529

## 2019-06-02 LAB — SARS CORONAVIRUS 2 (TAT 6-24 HRS): SARS Coronavirus 2: NEGATIVE

## 2019-06-03 ENCOUNTER — Other Ambulatory Visit: Payer: Self-pay

## 2019-06-03 ENCOUNTER — Ambulatory Visit (AMBULATORY_SURGERY_CENTER): Payer: 59 | Admitting: Internal Medicine

## 2019-06-03 ENCOUNTER — Encounter: Payer: Self-pay | Admitting: Internal Medicine

## 2019-06-03 VITALS — BP 147/100 | HR 107 | Temp 97.1°F | Resp 20 | Ht 73.0 in | Wt 323.0 lb

## 2019-06-03 DIAGNOSIS — K317 Polyp of stomach and duodenum: Secondary | ICD-10-CM

## 2019-06-03 DIAGNOSIS — D123 Benign neoplasm of transverse colon: Secondary | ICD-10-CM

## 2019-06-03 DIAGNOSIS — R140 Abdominal distension (gaseous): Secondary | ICD-10-CM

## 2019-06-03 DIAGNOSIS — R1310 Dysphagia, unspecified: Secondary | ICD-10-CM

## 2019-06-03 DIAGNOSIS — K219 Gastro-esophageal reflux disease without esophagitis: Secondary | ICD-10-CM

## 2019-06-03 DIAGNOSIS — K52832 Lymphocytic colitis: Secondary | ICD-10-CM

## 2019-06-03 DIAGNOSIS — K209 Esophagitis, unspecified without bleeding: Secondary | ICD-10-CM

## 2019-06-03 DIAGNOSIS — R197 Diarrhea, unspecified: Secondary | ICD-10-CM

## 2019-06-03 MED ORDER — SODIUM CHLORIDE 0.9 % IV SOLN: 500.0000 mL | Freq: Once | INTRAVENOUS | Status: DC

## 2019-06-03 NOTE — Progress Notes (Signed)
Called to room to assist during endoscopic procedure.  Patient ID and intended procedure confirmed with present staff. Received instructions for my participation in the procedure from the performing physician.  

## 2019-06-03 NOTE — Op Note (Addendum)
[place] Patient Name: Kevin Baxter Procedure Date: 06/03/2019 9:16 AM MRN: [id number] Endoscopist: Gatha Mayer , MD Age: 49 Referring MD:  Date of Birth: 23-Oct-1970 Gender: Male Account #: [id number] Procedure:                Colonoscopy Indications:              Clinically significant diarrhea of unexplained                            origin Medicines:                Propofol per Anesthesia, Monitored Anesthesia Care Procedure:                Pre-Anesthesia Assessment:                           - Prior to the procedure, a History and Physical                            was performed, and patient medications and                            allergies were reviewed. The patient's tolerance of                            previous anesthesia was also reviewed. The risks                            and benefits of the procedure and the sedation                            options and risks were discussed with the patient.                            All questions were answered, and informed consent                            was obtained. Prior Anticoagulants: The patient has                            taken no previous anticoagulant or antiplatelet                            agents. ASA Grade Assessment: II - A patient with                            mild systemic disease. After reviewing the risks                            and benefits, the patient was deemed in                            satisfactory condition to undergo the procedure.  After obtaining informed consent, the colonoscope                            was passed under direct vision. Throughout the                            procedure, the patient's blood pressure, pulse, and                            oxygen saturations were monitored continuously. The                            Colonoscope was introduced through the anus and                            advanced to the the cecum,  identified by                            appendiceal orifice and ileocecal valve. The                            colonoscopy was performed without difficulty. The                            patient tolerated the procedure well. The quality                            of the bowel preparation was excellent. The                            ileocecal valve, appendiceal orifice, and rectum                            were photographed. The bowel preparation used was                            Miralax via split dose instruction. Scope In: 9:33:34 AM Scope Out: 9:56:26 AM Scope Withdrawal Time: 0 hours 18 minutes 23 seconds  Total Procedure Duration: 0 hours 22 minutes 52 seconds  Findings:                 The perianal and digital rectal examinations were                            normal. Pertinent negatives include normal prostate                            (size, shape, and consistency).                           A 30 mm polyp was found in the distal transverse                            colon. The polyp was pedunculated. The polyp was  removed with a hot snare. Resection and retrieval                            were complete. Verification of patient                            identification for the specimen was done. Estimated                            blood loss was minimal. Area of polyp and stalkwas                            injected with 3 mL of a 1:10,000 solution of                            epinephrine for bleeding prophylaxis. To prevent                            bleeding after the polypectomy, two hemostatic                            clips were successfully placed (MR conditional).                            There was no bleeding during, or at the end, of the                            procedure.                           The terminal ileum appeared normal.                           The exam was otherwise without abnormality on                             direct and retroflexion views.                           Biopsies for histology were taken with a cold                            forceps from the entire colon for evaluation of                            microscopic colitis. Complications:            No immediate complications. Estimated Blood Loss:     Estimated blood loss was minimal. Impression:               - One 30 mm polyp in the distal transverse colon,                            removed with a hot snare. Resected and retrieved.  Injected. Clips (MR conditional) were placed.                           - The examined portion of the ileum was normal.                           - The examination was otherwise normal on direct                            and retroflexion views. Recommendation:           - Patient has a contact number available for                            emergencies. The signs and symptoms of potential                            delayed complications were discussed with the                            patient. Return to normal activities tomorrow.                            Written discharge instructions were provided to the                            patient.                           - Resume previous diet.                           - Continue present medications.                           - Repeat colonoscopy is recommended for                            surveillance. The colonoscopy date will be                            determined after pathology results from today's                            exam become available for review.                           - No aspirin, ibuprofen, naproxen, or other                            non-steroidal anti-inflammatory drugs for 2 weeks                            after polyp removal. Gatha Mayer, MD 06/03/2019 10:22:09 AM This report has been signed electronically. Addendum Number: 1   Addendum Date: 06/05/2019 5:27:56 PM  4 diminutive polyps  also removed by cold snare - recovered and sent to       pathology Gatha Mayer, MD 06/05/2019 5:28:20 PM This report has been signed electronically.

## 2019-06-03 NOTE — Progress Notes (Signed)
Pt's states no medical or surgical changes since previsit or office visit. 

## 2019-06-03 NOTE — Op Note (Signed)
[place] Patient Name: Kevin Baxter Procedure Date: 06/03/2019 9:17 AM MRN: [id number] Endoscopist: Gatha Mayer , MD Age: 49 Referring MD:  Date of Birth: 01-07-1971 Gender: Male Account #: [id number] Procedure:                Upper GI endoscopy Indications:              Dyspepsia, Abdominal bloating, Diarrhea Medicines:                Propofol per Anesthesia, Monitored Anesthesia Care Procedure:                Pre-Anesthesia Assessment:                           - Prior to the procedure, a History and Physical                            was performed, and patient medications and                            allergies were reviewed. The patient's tolerance of                            previous anesthesia was also reviewed. The risks                            and benefits of the procedure and the sedation                            options and risks were discussed with the patient.                            All questions were answered, and informed consent                            was obtained. Prior Anticoagulants: The patient has                            taken no previous anticoagulant or antiplatelet                            agents. ASA Grade Assessment: II - A patient with                            mild systemic disease. After reviewing the risks                            and benefits, the patient was deemed in                            satisfactory condition to undergo the procedure.                           After obtaining informed consent, the endoscope was  passed under direct vision. Throughout the                            procedure, the patient's blood pressure, pulse, and                            oxygen saturations were monitored continuously. The                            Endoscope was introduced through the mouth, and                            advanced to the second part of duodenum. The upper        GI endoscopy was accomplished without difficulty.                            The patient tolerated the procedure well. Scope In: Scope Out: Findings:                 LA Grade A (one or more mucosal breaks less than 5                            mm, not extending between tops of 2 mucosal folds)                            esophagitis with no bleeding was found in the                            distal esophagus. Biopsies were taken with a cold                            forceps for histology. Verification of patient                            identification for the specimen was done. Estimated                            blood loss was minimal.                           Multiple diminutive semi-sessile polyps with no                            stigmata of recent bleeding were found in the                            cardia and in the gastric body. Biopsies were taken                            with a cold forceps for histology. Verification of                            patient identification for the specimen was done.  Estimated blood loss was minimal.                           The exam was otherwise without abnormality.                           The cardia and gastric fundus were normal on                            retroflexion. Complications:            No immediate complications. Estimated Blood Loss:     Estimated blood loss was minimal. Impression:               - LA Grade A reflux esophagitis with no bleeding.                            Biopsied.                           - Multiple gastric polyps. Biopsied.                           - The examination was otherwise normal. Recommendation:           - Patient has a contact number available for                            emergencies. The signs and symptoms of potential                            delayed complications were discussed with the                            patient. Return to normal activities  tomorrow.                            Written discharge instructions were provided to the                            patient.                           - Resume previous diet.                           - Continue present medications.                           - See the other procedure note for documentation of                            additional recommendations. COLONOSCOPY NEXT Gatha Mayer, MD 06/03/2019 10:12:33 AM This report has been signed electronically.

## 2019-06-03 NOTE — Patient Instructions (Addendum)
On the upper exam: Mild small area of inflammation in the esophagus Small stomach polyps  All biopsied   Colon:  5 polyps - one large and 4 tiny. All removed. Large polyp site clipped to reduce bleeding after removal.  Small chance (<10%)that the large polyp was turning into cancer but I am confident it was removed.  Otherwise ok - colon biopsies taken to check for inflammation causing diarrhea.  I will contact with results and plans. I am glad you had this done.  I appreciate the opportunity to care for you. Gatha Mayer, MD, FACG      YOU HAD AN ENDOSCOPIC PROCEDURE TODAY AT [facility]:   Refer to the procedure report that was given to you for any specific questions about what was found during the examination.  If the procedure report does not answer your questions, please call your gastroenterologist to clarify.  If you requested that your care partner not be given the details of your procedure findings, then the procedure report has been included in a sealed envelope for you to review at your convenience later.  YOU SHOULD EXPECT: Some feelings of bloating in the abdomen. Passage of more gas than usual.  Walking can help get rid of the air that was put into your GI tract during the procedure and reduce the bloating. If you had a lower endoscopy (such as a colonoscopy or flexible sigmoidoscopy) you may notice spotting of blood in your stool or on the toilet paper. If you underwent a bowel prep for your procedure, you may not have a normal bowel movement for a few days.  Please Note:  You might notice some irritation and congestion in your nose or some drainage.  This is from the oxygen used during your procedure.  There is no need for concern and it should clear up in a day or so.  SYMPTOMS TO REPORT IMMEDIATELY:   Following lower endoscopy (colonoscopy or flexible sigmoidoscopy):  Excessive amounts of blood in the stool  Significant tenderness or worsening  of abdominal pains  Swelling of the abdomen that is new, acute  Fever of 100F or higher   Following upper endoscopy (EGD)  Vomiting of blood or coffee ground material  New chest pain or pain under the shoulder blades  Painful or persistently difficult swallowing  New shortness of breath  Fever of 100F or higher  Black, tarry-looking stools  For urgent or emergent issues, a gastroenterologist can be reached at any hour by calling [phone]. Do not use MyChart messaging for urgent concerns.    DIET:  We do recommend a small meal at first, but then you may proceed to your regular diet.  Drink plenty of fluids but you should avoid alcoholic beverages for 24 hours.  ACTIVITY:  You should plan to take it easy for the rest of today and you should NOT DRIVE or use heavy machinery until tomorrow (because of the sedation medicines used during the test).    FOLLOW UP: Our staff will call the number listed on your records 48-72 hours following your procedure to check on you and address any questions or concerns that you may have regarding the information given to you following your procedure. If we do not reach you, we will leave a message.  We will attempt to reach you two times.  During this call, we will ask if you have developed any symptoms of COVID 19. If you develop any symptoms (ie: fever, flu-like symptoms,  shortness of breath, cough etc.) before then, please call [phone].  If you test positive for Covid 19 in the 2 weeks post procedure, please call and report this information to [place].    If any biopsies were taken you will be contacted by phone or by letter within the next 1-3 weeks.  Please call us at [phone] if you have not heard about the biopsies in 3 weeks.    SIGNATURES/CONFIDENTIALITY: You and/or your care partner have signed paperwork which will be entered into your electronic medical record.  These signatures attest to the fact that that the information above on  your After Visit Summary has been reviewed and is understood.  Full responsibility of the confidentiality of this discharge information lies with you and/or your care-partner.   Information given on esophagitis and polyps.NO ASPIRIN, ASPIRIN CONTAINING PRODUCTS (BC OR GOODY POWDERS) OR NSAIDS (IBUPROFEN, ADVIL, ALEVE, AND MOTRIN) FOR  2 weeks.,TYLENOL IS OK TO TAKE,Resume remainder of medications.

## 2019-06-03 NOTE — Progress Notes (Signed)
To PACU, VSS. Report to [email or web address] 

## 2019-06-04 ENCOUNTER — Ambulatory Visit: Payer: Managed Care, Other (non HMO) | Admitting: Internal Medicine

## 2019-06-05 ENCOUNTER — Encounter: Payer: Self-pay | Admitting: Internal Medicine

## 2019-06-05 ENCOUNTER — Telehealth: Payer: Self-pay

## 2019-06-05 DIAGNOSIS — Z86010 Personal history of colonic polyps: Secondary | ICD-10-CM

## 2019-06-05 NOTE — Telephone Encounter (Signed)
  Follow up Call-  Call back number 06/03/2019  Post procedure Call Back phone  # [phone]  Permission to leave phone message Yes  Some recent data might be hidden     Patient questions:  Do you have a fever, pain , or abdominal swelling? No. Pain Score  0 *  Have you tolerated food without any problems? Yes.    Have you been able to return to your normal activities? Yes.    Do you have any questions about your discharge instructions: Diet   No. Medications  No. Follow up visit  No.  Do you have questions or concerns about your Care? No.  Actions: * If pain score is 4 or above: 1. No action needed, pain <4.Have you developed a fever since your procedure? no  2.   Have you had an respiratory symptoms (SOB or cough) since your procedure? no  3.   Have you tested positive for COVID 19 since your procedure no  4.   Have you had any family members/close contacts diagnosed with the COVID 19 since your procedure?  no   If yes to any of these questions please route to Joylene John, RN and Erenest Rasher, RN

## 2019-06-11 ENCOUNTER — Telehealth: Payer: 59 | Admitting: Cardiology

## 2019-06-22 NOTE — Progress Notes (Signed)
Virtual Sleep Medicine Consult  via Video Note   This visit type was conducted due to national recommendations for restrictions regarding the COVID-19 Pandemic (e.g. social distancing) in an effort to limit this patient's exposure and mitigate transmission in our community.  Due to his co-morbid illnesses, this patient is at least at moderate risk for complications without adequate follow up.  This format is felt to be most appropriate for this patient at this time.  All issues noted in this document were discussed and addressed.  A limited physical exam was performed with this format.  Please refer to the patient's chart for his consent to telehealth for [facility].   Evaluation Performed:  Sleep Medicine Consult  This visit type was conducted due to national recommendations for restrictions regarding the COVID-19 Pandemic (e.g. social distancing).  This format is felt to be most appropriate for this patient at this time.  All issues noted in this document were discussed and addressed.  No physical exam was performed (except for noted visual exam findings with Video Visits).  Please refer to the patient's chart (MyChart message for video visits and phone note for telephone visits) for the patient's consent to telehealth for [facility].  Date:  06/23/2019   ID:  Kevin Baxter, DOB 1970/08/09, MRN [id number]  Patient Location:  Home  Provider location:   [place]  PCP:  Colon Branch, MD  Sleep Medicine:  Fransico Him, MD Electrophysiologist:  None   Chief Complaint:  OSA  History of Present Illness:    Kevin Baxter is a 49 y.o. male who presents via audio/video conferencing for a telehealth visit today in referral by Dr. Larose Kells for evaluation of possible sleep apnea.   This is a 49yo male with a hx of HTN and GERD who is referred for evaluation of possible OSA.  He has morbid obesity, snoring, excessive daytime sleepiness with Epworth sleepiness sore of 16.  Dr. Larose Kells  recently saw him and has now referred him to me for sleep evaluation.  He goes to bed at 11:30pm but watches TV until 1am and then gets up around 7am.  He feels very sleepy when he gets up.  He feels very tired during the and has to nap.  He denies any am headaches.  He has not really notice that he snores unless he is very tired.  He denies waking up gasping for breath.    The patient does not have symptoms concerning for COVID-19 infection (fever, chills, cough, or new shortness of breath).    Prior CV studies:   The following studies were reviewed today:  none  Past Medical History:  Diagnosis Date  . Allergy   . Arthritis   . Fundic gland polyposis of stomach   . GERD with esophagitis   . Gout   . Hx of adenomatous colonic polyps   . Hypertension    Past Surgical History:  Procedure Laterality Date  . COLONOSCOPY  05/2019  . ESOPHAGOGASTRODUODENOSCOPY  05/2019  . HAND SURGERY Right    2006 for a FX     Current Meds  Medication Sig  . allopurinol (ZYLOPRIM) 300 MG tablet Take 1 tablet (300 mg total) by mouth daily.  . colchicine 0.6 MG tablet Take 1 tablet (0.6 mg total) by mouth 2 (two) times daily as needed (gout flare).  Marland Kitchen dicyclomine (BENTYL) 20 MG tablet Take 1 tablet (20 mg total) by mouth every 6 (six) hours as needed for spasms.  Marland Kitchen  esomeprazole (NEXIUM) 20 MG capsule Take 20 mg by mouth daily as needed.  Marland Kitchen losartan (COZAAR) 100 MG tablet TAKE 1 TABLET BY MOUTH EVERY DAY  . tadalafil (CIALIS) 20 MG tablet Take 1 tablet (20 mg total) by mouth daily as needed for erectile dysfunction.     Allergies:   Pseudoephedrine   Social History   Tobacco Use  . Smoking status: Never Smoker  . Smokeless tobacco: Never Used  Substance Use Topics  . Alcohol use: Yes    Comment: very rare   . Drug use: No     Family Hx: The patient's family history includes Cancer in his mother; Diabetes in his cousin, paternal aunt, sister, and sister; Heart failure in his father;  Hypertension in his father. There is no history of Colon cancer, Prostate cancer, Stroke, Esophageal cancer, Rectal cancer, or Stomach cancer.  ROS:   Please see the history of present illness.     All other systems reviewed and are negative.   Labs/Other Tests and Data Reviewed:    Recent Labs: 12/03/2018: ALT 16; TSH 1.59 04/22/2019: Hemoglobin 17.2; Platelets 244.0 05/20/2019: BUN 14; Creatinine, Ser 1.20; Potassium 4.8; Sodium 136   Recent Lipid Panel Lab Results  Component Value Date/Time   CHOL 188 12/03/2018 04:18 PM   TRIG 149.0 12/03/2018 04:18 PM   TRIG 69 11/22/2005 10:16 AM   HDL 50.10 12/03/2018 04:18 PM   CHOLHDL 4 12/03/2018 04:18 PM   LDLCALC 108 (H) 12/03/2018 04:18 PM    Wt Readings from Last 3 Encounters:  06/23/19 (!) 320 lb (145.2 kg)  06/03/19 (!) 323 lb (146.5 kg)  05/22/19 (!) 328 lb 4 oz (148.9 kg)     Objective:    Vital Signs:  Ht 6\' 1"  (1.854 m)   Wt (!) 320 lb (145.2 kg)   BMI 42.22 kg/m     ASSESSMENT & PLAN:    1.  Excessive daytime sleepiness -he is morbidly obese and complains of snoring and excessive daytime sleepiness -I suspect he has significant OSA -I will order a split night sleep study  2.  HTN -continue Losartan 100mg  daily  3.  Morbid Obesity -he has started running on the treadmill recently -encouraged him to try to watch his carbs and portions  COVID-19 Education: The signs and symptoms of COVID-19 were discussed with the patient and how to seek care for testing (follow up with PCP or arrange E-visit).  The importance of social distancing was discussed today.  Patient Risk:   After full review of this patient's clinical status, I feel that they are at least moderate risk at this time.  Time:   Today, I have spent 20 minutes on telemdicine discussing medical problems including excessive daytime sleepiness, Obesity and HTN and reviewing patient's chart including OV notes from PCP.Marland Kitchen  Medication Adjustments/Labs and  Tests Ordered: Current medicines are reviewed at length with the patient today.  Concerns regarding medicines are outlined above.  Tests Ordered: No orders of the defined types were placed in this encounter.  Medication Changes: No orders of the defined types were placed in this encounter.   Disposition:  Follow up prn  Signed, Fransico Him, MD  06/23/2019 9:25 AM    [facility]

## 2019-06-23 ENCOUNTER — Other Ambulatory Visit: Payer: Self-pay

## 2019-06-23 ENCOUNTER — Telehealth: Payer: Self-pay | Admitting: *Deleted

## 2019-06-23 ENCOUNTER — Telehealth (INDEPENDENT_AMBULATORY_CARE_PROVIDER_SITE_OTHER): Payer: 59 | Admitting: Cardiology

## 2019-06-23 ENCOUNTER — Encounter: Payer: Self-pay | Admitting: Cardiology

## 2019-06-23 VITALS — Ht 73.0 in | Wt 320.0 lb

## 2019-06-23 DIAGNOSIS — I10 Essential (primary) hypertension: Secondary | ICD-10-CM

## 2019-06-23 DIAGNOSIS — G4719 Other hypersomnia: Secondary | ICD-10-CM | POA: Diagnosis not present

## 2019-06-23 DIAGNOSIS — R0683 Snoring: Secondary | ICD-10-CM | POA: Diagnosis not present

## 2019-06-23 NOTE — Patient Instructions (Signed)
Medication Instructions:  Your physician recommends that you continue on your current medications as directed. Please refer to the Current Medication list given to you today.  *If you need a refill on your cardiac medications before your next appointment, please call your pharmacy*   Testing/Procedures: Your physician has recommended that you have a sleep study. This test records several body functions during sleep, including: brain activity, eye movement, oxygen and carbon dioxide blood levels, heart rate and rhythm, breathing rate and rhythm, the flow of air through your mouth and nose, snoring, body muscle movements, and chest and belly movement.   Follow-Up: At [facility], you and your health needs are our priority.  As part of our continuing mission to provide you with exceptional heart care, we have created designated Provider Care Teams.  These Care Teams include your primary Cardiologist (physician) and Advanced Practice Providers (APPs -  Physician Assistants and Nurse Practitioners) who all work together to provide you with the care you need, when you need it.  We recommend signing up for the patient portal called "MyChart".  Sign up information is provided on this After Visit Summary.  MyChart is used to connect with patients for Virtual Visits (Telemedicine).  Patients are able to view lab/test results, encounter notes, upcoming appointments, etc.  Non-urgent messages can be sent to your provider as well.   To learn more about what you can do with MyChart, go to [email or web address].    Follow up with Dr. Turner as needed based on results from testing.     

## 2019-06-23 NOTE — Telephone Encounter (Addendum)
-----   Message from Antonieta Iba, RN sent at 06/23/2019  9:34 AM EDT ----- Split night sleep study has been ordered.  Thanks!  Algie Coffer M  Waddell, Wanda M, CMA Good Morning, Shelia from [facility] need you to call her at [phone]. Please call her before 4:00 today. Have a good weekend, it was so go seeing you on Saturday.

## 2019-06-23 NOTE — Addendum Note (Signed)
Addended by: Antonieta Iba on: 06/23/2019 09:34 AM   Modules accepted: Orders

## 2019-07-01 ENCOUNTER — Telehealth: Payer: Self-pay | Admitting: Cardiology

## 2019-07-01 NOTE — Telephone Encounter (Signed)
Avanna from [facility] is requesting clinicals. Case number is [id number] and can be faxed to [phone].

## 2019-07-07 ENCOUNTER — Telehealth: Payer: Self-pay | Admitting: *Deleted

## 2019-07-07 NOTE — Telephone Encounter (Signed)
PA submitted for in lab sleep study to [facility] via web portal.

## 2019-07-14 ENCOUNTER — Telehealth: Payer: Self-pay | Admitting: *Deleted

## 2019-07-14 NOTE — Telephone Encounter (Signed)
Staff message sent to Gae Bon in lab study denied. Ok to schedule HST. Notify ordering provider and order/

## 2019-07-16 NOTE — Telephone Encounter (Signed)
Kevin Baxter, CMA  Kevin Baxter, [place] denied in lab study. Ok to schedule HST. No PA is required.

## 2019-07-18 NOTE — Addendum Note (Signed)
Addended by: Freada Bergeron on: 07/18/2019 12:15 PM   Modules accepted: Orders

## 2019-07-21 NOTE — Telephone Encounter (Signed)
Patient is aware and agreeable to Home Sleep Study through [facility]. Patient is scheduled for 8/31 at  12p  to pick up home sleep kit and meet with Respiratory therapist at [facility]. Patient is aware that if this appointment date and time does not work for them they should contact Artis Delay directly at [phone]. Patient is aware that a sleep packet will be sent from [facility] in week. Patient is agreeable to treatment and thankful for call.

## 2019-08-12 ENCOUNTER — Telehealth: Payer: Self-pay | Admitting: Cardiology

## 2019-08-12 NOTE — Telephone Encounter (Signed)
Holly with [facility] is calling to follow up in regards to prior authorization. She states a call may be returned to [phone] to discuss. Otherwise, records may be faxed to [phone].

## 2019-08-21 ENCOUNTER — Ambulatory Visit: Payer: 59 | Admitting: Internal Medicine

## 2019-09-30 ENCOUNTER — Ambulatory Visit (HOSPITAL_BASED_OUTPATIENT_CLINIC_OR_DEPARTMENT_OTHER): Payer: 59 | Attending: Cardiology | Admitting: Cardiology

## 2019-10-01 ENCOUNTER — Other Ambulatory Visit: Payer: Self-pay | Admitting: Internal Medicine

## 2019-10-07 ENCOUNTER — Ambulatory Visit: Payer: 59 | Admitting: Internal Medicine

## 2019-11-29 ENCOUNTER — Other Ambulatory Visit: Payer: Self-pay | Admitting: Internal Medicine

## 2019-12-03 ENCOUNTER — Telehealth (HOSPITAL_COMMUNITY): Payer: Self-pay | Admitting: Internal Medicine

## 2019-12-03 NOTE — Telephone Encounter (Signed)
I called patient to schedule the Echo cardiogram that your ordered and patient declined to schedule and does not wish to have. Order will be removed from the WQ. Thank you.

## 2019-12-03 NOTE — Telephone Encounter (Signed)
Noted, thank you

## 2019-12-15 ENCOUNTER — Other Ambulatory Visit: Payer: Self-pay | Admitting: Internal Medicine

## 2019-12-22 ENCOUNTER — Other Ambulatory Visit: Payer: Self-pay | Admitting: Internal Medicine

## 2020-01-17 ENCOUNTER — Other Ambulatory Visit: Payer: Self-pay | Admitting: Internal Medicine

## 2020-02-02 ENCOUNTER — Telehealth: Payer: Self-pay | Admitting: Internal Medicine

## 2020-02-02 MED ORDER — LOSARTAN POTASSIUM 100 MG PO TABS: 100.0000 mg | ORAL_TABLET | Freq: Every day | ORAL | 0 refills | Status: DC

## 2020-02-02 NOTE — Telephone Encounter (Signed)
Rx sent 

## 2020-02-02 NOTE — Telephone Encounter (Signed)
Medication:  losartan (COZAAR) 100 MG tablet [[id number]]     Has the patient contacted their pharmacy?  They contacted him  (If no, request that the patient contact the pharmacy for the refill.) (If yes, when and what did the pharmacy advise?)    Preferred Pharmacy (with phone number or street name): [facility] [address] [place]    Agent: Please be advised that RX refills may take up to 3 business days. We ask that you follow-up with your pharmacy.

## 2020-02-16 ENCOUNTER — Emergency Department
Admission: EM | Admit: 2020-02-16 | Discharge: 2020-02-16 | Disposition: A | Discharge status: Home / Self Care | Payer: 59 | Source: Home / Self Care | Attending: Family Medicine | Admitting: Family Medicine

## 2020-02-16 ENCOUNTER — Other Ambulatory Visit: Payer: Self-pay

## 2020-02-16 DIAGNOSIS — R053 Chronic cough: Secondary | ICD-10-CM | POA: Diagnosis not present

## 2020-02-16 DIAGNOSIS — K219 Gastro-esophageal reflux disease without esophagitis: Secondary | ICD-10-CM

## 2020-02-16 NOTE — ED Triage Notes (Signed)
Patient presents to Urgent Care with complaints of cough, nasal congestion since about 30 days ago. Patient reports he wants to make sure he does not have bronchitis or pneumonia. Had a negative covid test 4 days ago .

## 2020-02-18 NOTE — ED Provider Notes (Signed)
[place]   [id number] 02/16/20 Arrival Time: 1301  ASSESSMENT & PLAN:  1. Persistent dry cough   2. Gastroesophageal reflux disease without esophagitis     Suspect coughing is from GERD. Discussed. Normal lung exam. Has Prilosec at home; begin QD. Has f/u with PCP in 1-2 weeks. COVID neg 4 d ago.    Follow-up Information    Colon Branch, MD.   Specialty: Internal Medicine Why: Keep your follow up appt. Contact information: [address] [place] [address] [phone]               Reviewed expectations re: course of current medical issues. Questions answered. Outlined signs and symptoms indicating need for more acute intervention. Understanding verbalized. After Visit Summary given.   SUBJECTIVE: History from: patient. Kevin Baxter is a 50 y.o. male who reports a persistent dry cough over past month; also with significant acid reflux symptoms; belching/bloating after eating. No SOB. Coughing is sporadic; sometimes worse in am.   Social History   Tobacco Use  Smoking Status Never Smoker  Smokeless Tobacco Never Used   Normal PO intake without n/v/d.    OBJECTIVE:  Vitals:   02/16/20 1336  BP: (!) 157/104  Pulse: (!) 110  Resp: 18  Temp: 98 F (36.7 C)  TempSrc: Oral  SpO2: 97%    Slight tachycardia noted; "I'm very nervous about coming to the doctor". General appearance: alert; no distress Eyes: PERRLA; EOMI; conjunctiva normal HENT: [place]; AT; without nasal congestion Neck: supple  Lungs: speaks full sentences without difficulty; unlabored; CTAB Abd: soft; NT; ND Extremities: no edema Skin: warm and dry Neurologic: normal gait Psychological: alert and cooperative; normal mood and affect     Allergies  Allergen Reactions  . Pseudoephedrine     REACTION: jittery    Past Medical History:  Diagnosis Date  . Allergy   . Arthritis   . Fundic gland polyposis of stomach   . GERD with esophagitis   .  Gout   . Hx of adenomatous colonic polyps   . Hypertension    Social History   Socioeconomic History  . Marital status: Married    Spouse name: Not on file  . Number of children: 2  . Years of education: Not on file  . Highest education level: Not on file  Occupational History  . Occupation: works for [employer]  . Smoking status: Never Smoker  . Smokeless tobacco: Never Used  Vaping Use  . Vaping Use: Never used  Substance and Sexual Activity  . Alcohol use: Yes    Comment: very rare   . Drug use: No  . Sexual activity: Not on file  Other Topics Concern  . Not on file  Social History Narrative   Household: pt, wife daughter (2013),  boy  (2006),    Social Determinants of Radio broadcast assistant Strain: Not on file  Food Insecurity: Not on file  Transportation Needs: Not on file  Physical Activity: Not on file  Stress: Not on file  Social Connections: Not on file  Intimate Partner Violence: Not on file   Family History  Problem Relation Age of Onset  . Cancer Mother        CERVICAL  . Hypertension Father   . Heart failure Father        no MI, d/t etoh  . Diabetes Sister   . Diabetes Sister   . Diabetes Paternal Aunt   .  Diabetes Cousin   . Colon cancer Neg Hx   . Prostate cancer Neg Hx   . Stroke Neg Hx   . Esophageal cancer Neg Hx   . Rectal cancer Neg Hx   . Stomach cancer Neg Hx    Past Surgical History:  Procedure Laterality Date  . COLONOSCOPY  05/2019  . ESOPHAGOGASTRODUODENOSCOPY  05/2019  . HAND SURGERY Right    2006 for a Evalee Jefferson, MD 02/18/20 1116

## 2020-02-20 ENCOUNTER — Telehealth: Payer: Self-pay

## 2020-02-20 ENCOUNTER — Telehealth: Payer: Self-pay | Admitting: Internal Medicine

## 2020-02-20 DIAGNOSIS — R059 Cough, unspecified: Secondary | ICD-10-CM

## 2020-02-20 MED ORDER — ESOMEPRAZOLE MAGNESIUM 40 MG PO CPDR: 40.0000 mg | DELAYED_RELEASE_CAPSULE | Freq: Every day | ORAL | 3 refills | Status: DC

## 2020-02-20 NOTE — Telephone Encounter (Signed)
Pt states that he is having troubles burping and he feels like something is stuck in his chest. He would like some advise.

## 2020-02-20 NOTE — Telephone Encounter (Addendum)
He had reflux esophagitis on EGD last year and was supposed to be on a daily PPI but looks like he was only on it as needed and at lower dose  Tell him I have prescribed 40 mg generic Nexium to be taken every day before breakfast  Agree with your other recommendations  He can also come for a PA and lateral chest x-ray regarding cough I ordered that for med [facility]  He can do it today, they might be open on the weekends I am not sure but if he cannot do it today he can do it Monday

## 2020-02-20 NOTE — Telephone Encounter (Signed)
I started a prior authorization for Kevin Baxter's esomeprazole 40mg  thru [employer] for his GERD with esophagitis K21.00. It has been approved good thru 02/19/2021. Pharmacy informed.

## 2020-02-20 NOTE — Telephone Encounter (Signed)
Called patient back and he states for about 2 weeks he has had the feeling of something stuck in his chest. Says food and liquid go down alright, and he feels he would get relief if he could burp good, and he can't. He has tried ginger-ale and Copywriter, advertising with no results. He also has a lot of chest congestion that is probably contributing to the feeling. I suggested he take a hot shower, letting the water hit his chest and take Mucinex, until further recommendations from Dr. Carlean Purl.

## 2020-02-20 NOTE — Addendum Note (Signed)
Addended by: Gatha Mayer on: 02/20/2020 01:55 PM   Modules accepted: Orders

## 2020-02-20 NOTE — Telephone Encounter (Signed)
Left message for patient to please call back. 

## 2020-02-24 ENCOUNTER — Telehealth: Payer: Self-pay

## 2020-02-24 NOTE — Telephone Encounter (Signed)
Left message again, for patient to please call back.

## 2020-02-25 ENCOUNTER — Other Ambulatory Visit: Payer: Self-pay | Admitting: Internal Medicine

## 2020-02-26 ENCOUNTER — Emergency Department (HOSPITAL_BASED_OUTPATIENT_CLINIC_OR_DEPARTMENT_OTHER): Payer: 59

## 2020-02-26 ENCOUNTER — Other Ambulatory Visit: Payer: Self-pay

## 2020-02-26 ENCOUNTER — Encounter (HOSPITAL_BASED_OUTPATIENT_CLINIC_OR_DEPARTMENT_OTHER): Payer: Self-pay | Admitting: Emergency Medicine

## 2020-02-26 ENCOUNTER — Inpatient Hospital Stay (HOSPITAL_BASED_OUTPATIENT_CLINIC_OR_DEPARTMENT_OTHER)
Admission: EM | Admit: 2020-02-26 | Discharge: 2020-02-29 | DRG: 439 | Disposition: A | Discharge status: Home / Self Care | Payer: 59 | Attending: Internal Medicine | Admitting: Internal Medicine

## 2020-02-26 DIAGNOSIS — K859 Acute pancreatitis without necrosis or infection, unspecified: Principal | ICD-10-CM | POA: Diagnosis present

## 2020-02-26 DIAGNOSIS — K219 Gastro-esophageal reflux disease without esophagitis: Secondary | ICD-10-CM | POA: Diagnosis not present

## 2020-02-26 DIAGNOSIS — Z86010 Personal history of colonic polyps: Secondary | ICD-10-CM | POA: Diagnosis not present

## 2020-02-26 DIAGNOSIS — Z8249 Family history of ischemic heart disease and other diseases of the circulatory system: Secondary | ICD-10-CM | POA: Diagnosis not present

## 2020-02-26 DIAGNOSIS — Z833 Family history of diabetes mellitus: Secondary | ICD-10-CM | POA: Diagnosis not present

## 2020-02-26 DIAGNOSIS — I10 Essential (primary) hypertension: Secondary | ICD-10-CM | POA: Diagnosis not present

## 2020-02-26 DIAGNOSIS — Z79899 Other long term (current) drug therapy: Secondary | ICD-10-CM

## 2020-02-26 DIAGNOSIS — Z809 Family history of malignant neoplasm, unspecified: Secondary | ICD-10-CM | POA: Diagnosis not present

## 2020-02-26 DIAGNOSIS — Z20822 Contact with and (suspected) exposure to covid-19: Secondary | ICD-10-CM | POA: Diagnosis not present

## 2020-02-26 DIAGNOSIS — M1A9XX0 Chronic gout, unspecified, without tophus (tophi): Secondary | ICD-10-CM | POA: Diagnosis not present

## 2020-02-26 DIAGNOSIS — R7989 Other specified abnormal findings of blood chemistry: Secondary | ICD-10-CM | POA: Diagnosis not present

## 2020-02-26 DIAGNOSIS — M109 Gout, unspecified: Secondary | ICD-10-CM | POA: Diagnosis present

## 2020-02-26 DIAGNOSIS — Z888 Allergy status to other drugs, medicaments and biological substances status: Secondary | ICD-10-CM | POA: Diagnosis not present

## 2020-02-26 DIAGNOSIS — Z6841 Body Mass Index (BMI) 40.0 and over, adult: Secondary | ICD-10-CM | POA: Diagnosis not present

## 2020-02-26 LAB — URINALYSIS, MICROSCOPIC (REFLEX)

## 2020-02-26 LAB — URINALYSIS, ROUTINE W REFLEX MICROSCOPIC
Glucose, UA: NEGATIVE mg/dL
Ketones, ur: 15 mg/dL — AB
Leukocytes,Ua: NEGATIVE
Nitrite: NEGATIVE
Protein, ur: NEGATIVE mg/dL
Specific Gravity, Urine: 1.03 (ref 1.005–1.030)
pH: 6 (ref 5.0–8.0)

## 2020-02-26 LAB — COMPREHENSIVE METABOLIC PANEL
ALT: 16 U/L (ref 0–44)
AST: 16 U/L (ref 15–41)
Albumin: 4.2 g/dL (ref 3.5–5.0)
Alkaline Phosphatase: 62 U/L (ref 38–126)
Anion gap: 11 (ref 5–15)
BUN: 12 mg/dL (ref 6–20)
CO2: 26 mmol/L (ref 22–32)
Calcium: 9.4 mg/dL (ref 8.9–10.3)
Chloride: 101 mmol/L (ref 98–111)
Creatinine, Ser: 1.33 mg/dL — ABNORMAL HIGH (ref 0.61–1.24)
GFR, Estimated: >60 mL/min (ref >60)
Glucose, Bld: 112 mg/dL — ABNORMAL HIGH (ref 70–99)
Potassium: 4.2 mmol/L (ref 3.5–5.1)
Sodium: 138 mmol/L (ref 135–145)
Total Bilirubin: 2.1 mg/dL — ABNORMAL HIGH (ref 0.3–1.2)
Total Protein: 8.5 g/dL — ABNORMAL HIGH (ref 6.5–8.1)

## 2020-02-26 LAB — CBC
HCT: 57.2 % — ABNORMAL HIGH (ref 39.0–52.0)
Hemoglobin: 18.5 g/dL — ABNORMAL HIGH (ref 13.0–17.0)
MCH: 25.7 pg — ABNORMAL LOW (ref 26.0–34.0)
MCHC: 32.3 g/dL (ref 30.0–36.0)
MCV: 79.4 fL — ABNORMAL LOW (ref 80.0–100.0)
Platelets: 256 10*3/uL (ref 150–400)
RBC: 7.2 MIL/uL — ABNORMAL HIGH (ref 4.22–5.81)
RDW: 17.2 % — ABNORMAL HIGH (ref 11.5–15.5)
WBC: 7.1 10*3/uL (ref 4.0–10.5)
nRBC: 0 % (ref 0.0–0.2)

## 2020-02-26 LAB — LIPASE, BLOOD: Lipase: 82 U/L — ABNORMAL HIGH (ref 11–51)

## 2020-02-26 LAB — TRIGLYCERIDES: Triglycerides: 78 mg/dL (ref <150)

## 2020-02-26 LAB — TROPONIN I (HIGH SENSITIVITY)
Troponin I (High Sensitivity): 30 ng/L — ABNORMAL HIGH (ref <18)
Troponin I (High Sensitivity): 33 ng/L — ABNORMAL HIGH (ref <18)

## 2020-02-26 LAB — SARS CORONAVIRUS 2 BY RT PCR (HOSPITAL ORDER, PERFORMED IN ~~LOC~~ HOSPITAL LAB): SARS Coronavirus 2: NEGATIVE

## 2020-02-26 IMAGING — CT CT ANGIO CHEST
2 of 9 series · 18 of 36 positions shown · IV contrast (Omnipaque)
Comparison: None.

CLINICAL DATA: Abdominal pain for 3 weeks

EXAM:
CT ANGIOGRAPHY CHEST
CT ABDOMEN AND PELVIS WITH CONTRAST
TECHNIQUE: Multidetector CT imaging of the chest was performed using the
standard protocol during bolus administration of intravenous
contrast. Multiplanar CT image reconstructions and MIPs were
obtained to evaluate the vascular anatomy. Multidetector CT imaging
of the abdomen and pelvis was performed using the standard protocol
during bolus administration of intravenous contrast.
CONTRAST:  100mL OMNIPAQUE IOHEXOL 350 MG/ML SOLN

[Series 7: pe coronal mpr · coronal · 0.59mm/px · 1 of 101 slices shown]
[im 51/101  mediastinal]
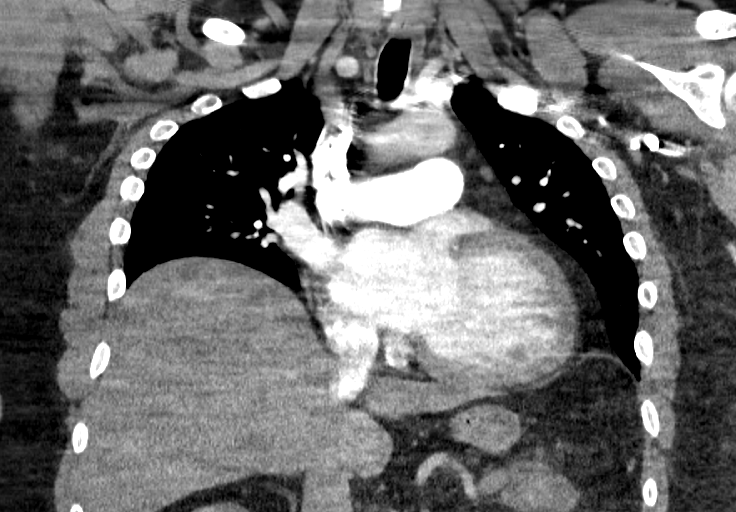

[Series 11: pe thins · axial · 0.79mm/px · z∈[-254,+16]mm · 17 of 401 slices shown]
[im 21/401  lung]
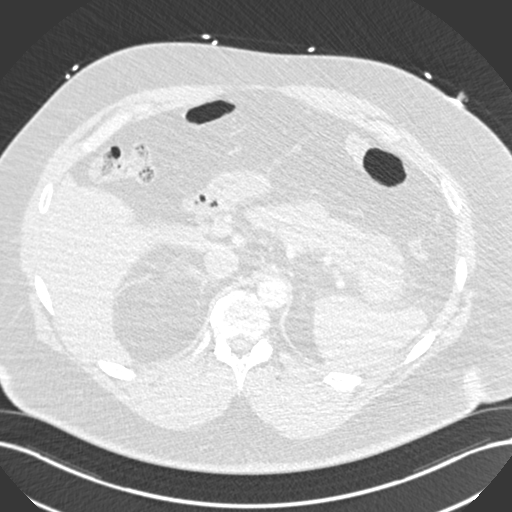
[im 41/401  mediastinal]
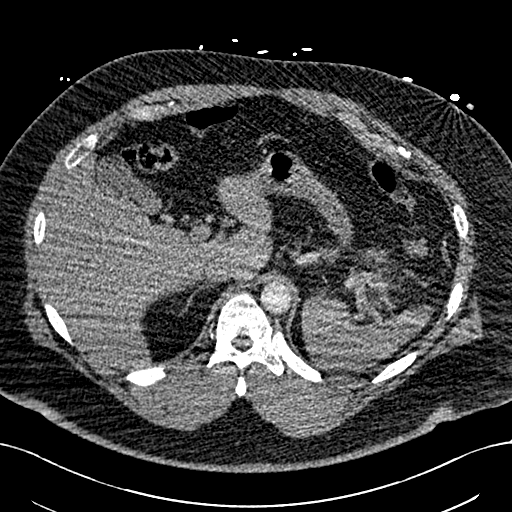
[im 61/401  lung]
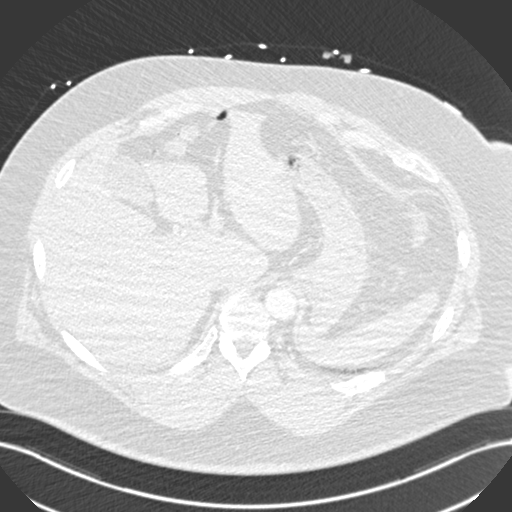
[im 81/401  mediastinal]
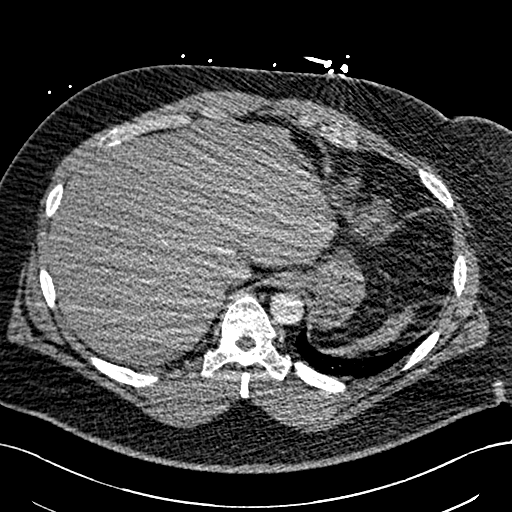
[im 121/401  lung]
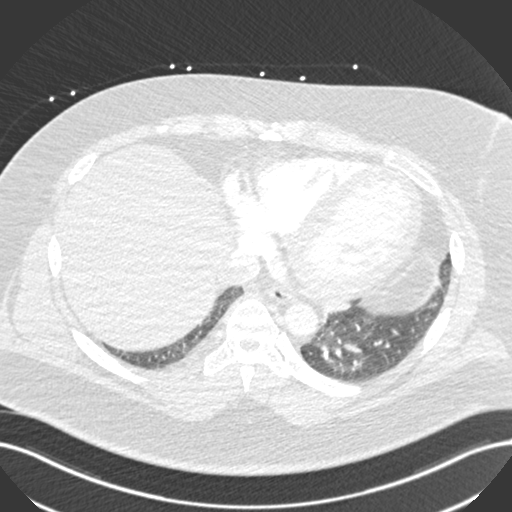
[im 141/401  mediastinal]
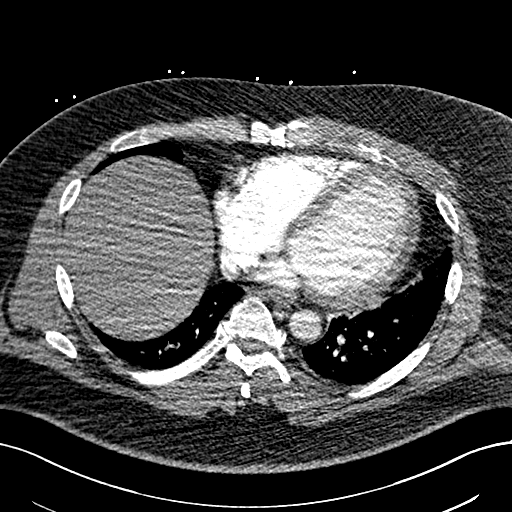
[im 161/401  lung]
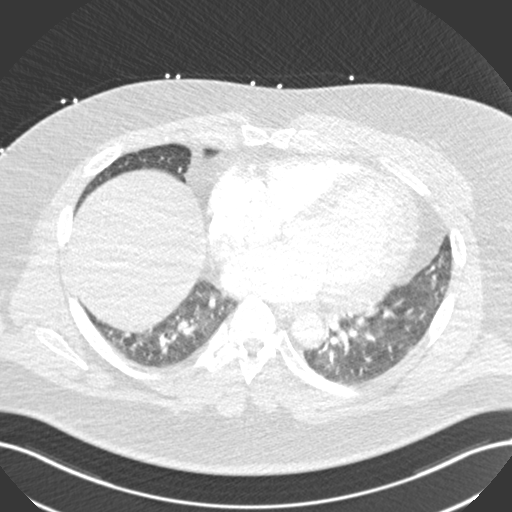
[im 181/401  mediastinal]
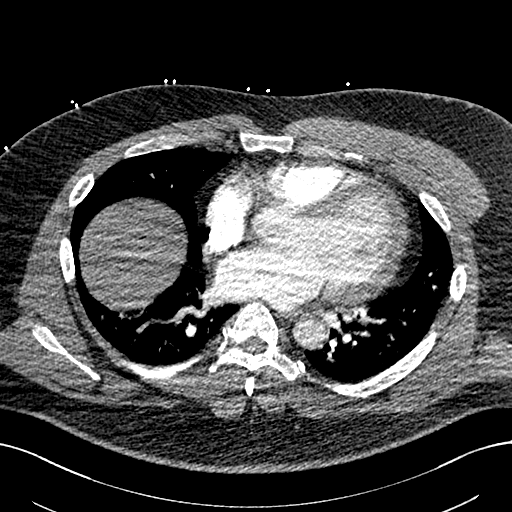
[im 201/401  lung]
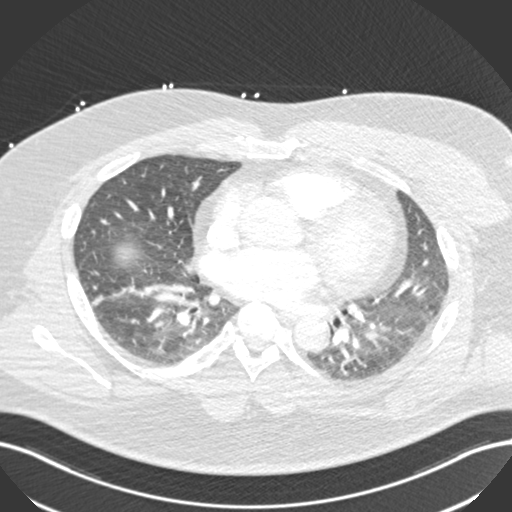
[im 221/401  mediastinal]
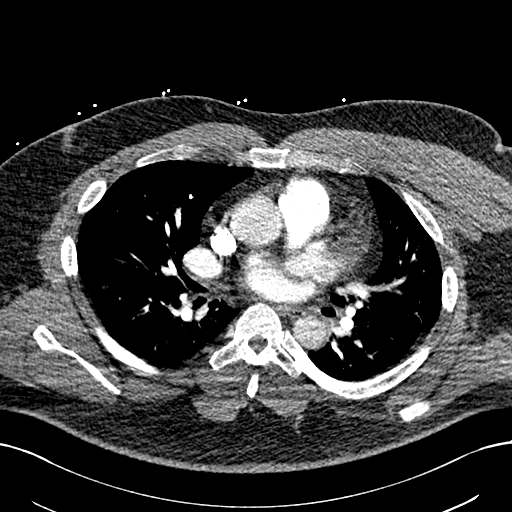
[im 241/401  lung]
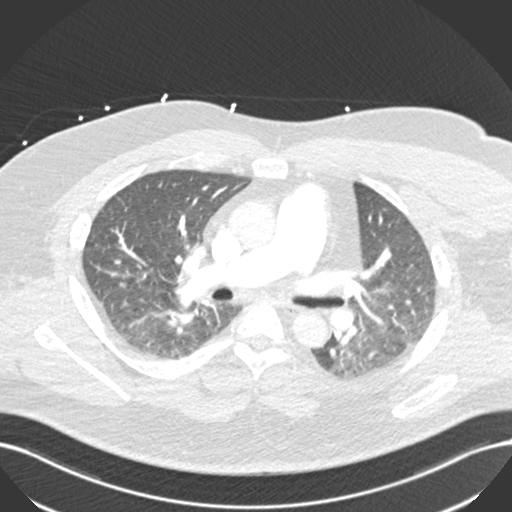
[im 261/401  mediastinal]
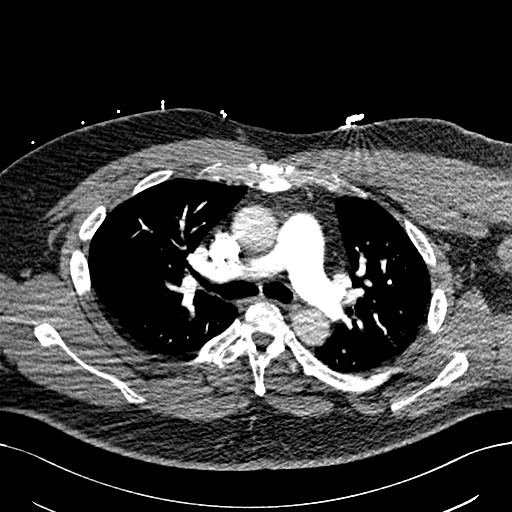
[im 281/401  lung]
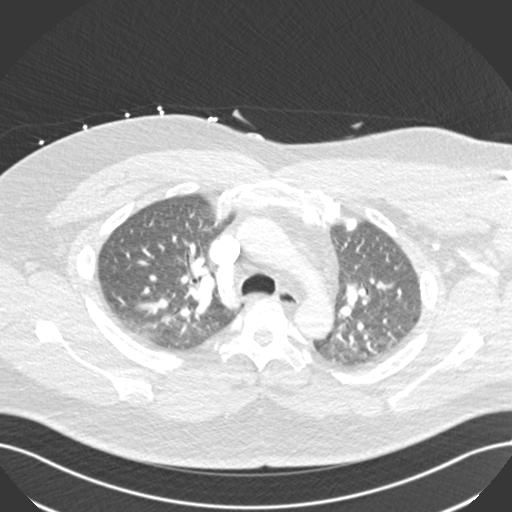
[im 321/401  mediastinal]
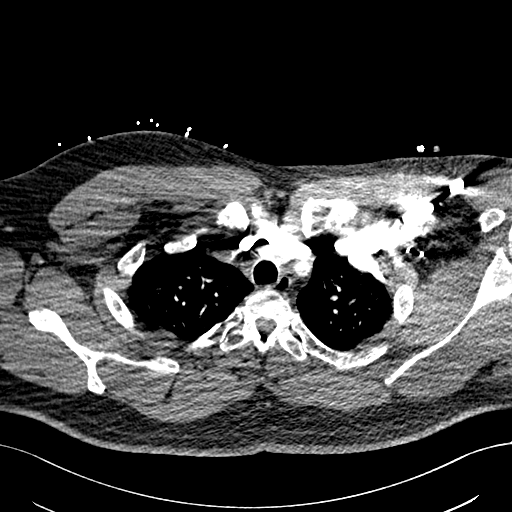
[im 341/401  lung]
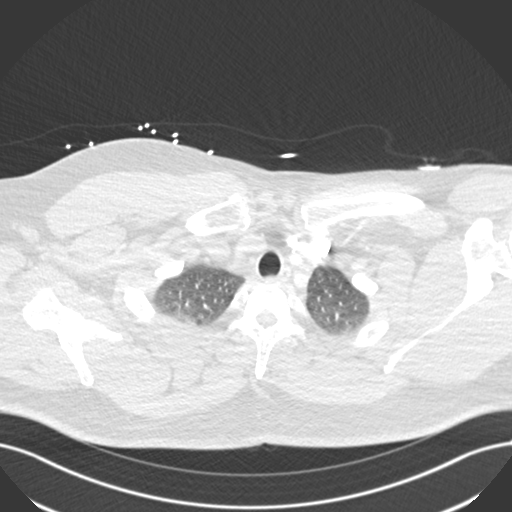
[im 361/401  mediastinal]
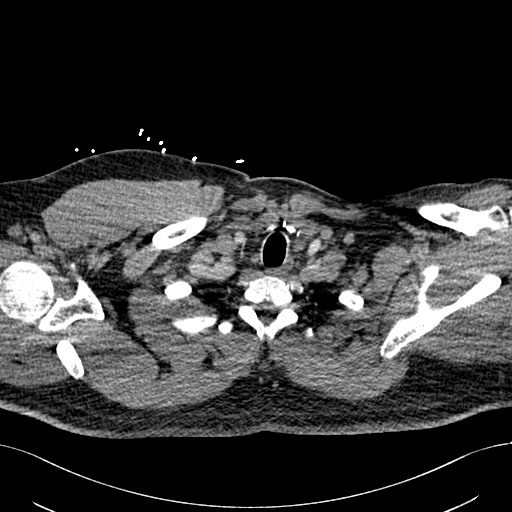
[im 381/401  lung]
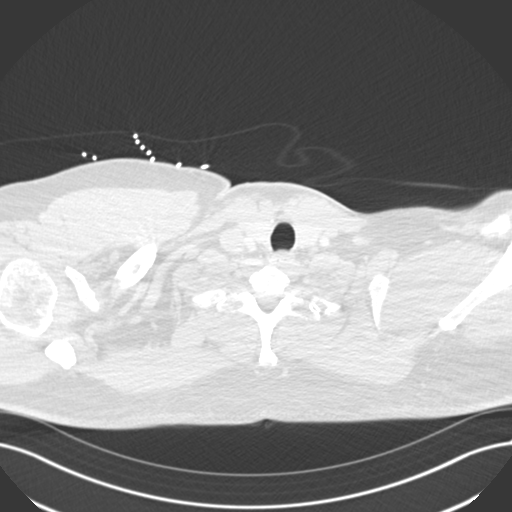

[18 of 36 positions shown; findings below may reference images not displayed]

FINDINGS: CTA CHEST FINDINGS

Cardiovascular: Satisfactory opacification of the pulmonary arteries
to the segmental level. No evidence of pulmonary embolism. Mildly
enlarged heart size. No pericardial effusion.

Mediastinum/Nodes: Thyroid is unremarkable. No axillary or
mediastinal adenopathy.

Lungs/Pleura: No pleural effusion or pneumothorax. Subsegmental
RIGHT lower lobe atelectasis.

Musculoskeletal: No chest wall abnormality. No acute or significant
osseous findings.

Review of the MIP images confirms the above findings.

CT ABDOMEN and PELVIS FINDINGS

Hepatobiliary: Focal fatty deposition adjacent to the falciform
ligament. Gallbladder is unremarkable. No intrahepatic or
extrahepatic biliary ductal dilation. Portal vein is patent.

Pancreas: There is expansion of the pancreatic tail with adjacent
fat stranding and interdigitating edema. No evidence of
pseudoaneurysm formation or significant adjacent organized fluid
collection. The pancreas demonstrates relatively homogeneous
enhancement.

Spleen: Spleen is unremarkable.

Adrenals/Urinary Tract: Adrenal glands are unremarkable. No
hydronephrosis. RIGHT renal cyst. Additional subcentimeter hypodense
lesions are too small to accurately characterize. Bladder is
decompressed.

Stomach/Bowel: Stomach is within normal limits. Appendix appears
normal. No evidence of bowel wall thickening, distention, or
inflammatory changes.

Vascular/Lymphatic: No significant vascular findings are present. No
enlarged abdominal or pelvic lymph nodes.

Reproductive: Prostate is present.

Other: No free air.  Fat containing RIGHT inguinal hernia.

Musculoskeletal: Degenerative changes of the lower lumbar spine with
mild retrolisthesis of L5-S1.

Review of the MIP images confirms the above findings.
IMPRESSION: 1. Constellation of findings are consistent with acute interstitial
pancreatitis. Recommend correlation with lipase levels.
2. No evidence of acute pulmonary embolism. No acute intrathoracic
process.

## 2020-02-26 IMAGING — CT CT ABD-PELV W/ CM
2 of 5 series · 15 of 46 positions shown, 17 images · IV contrast (Omnipaque)
Comparison: None.

CLINICAL DATA: Abdominal pain for 3 weeks

EXAM:
CT ANGIOGRAPHY CHEST
CT ABDOMEN AND PELVIS WITH CONTRAST
TECHNIQUE: Multidetector CT imaging of the chest was performed using the
standard protocol during bolus administration of intravenous
contrast. Multiplanar CT image reconstructions and MIPs were
obtained to evaluate the vascular anatomy. Multidetector CT imaging
of the abdomen and pelvis was performed using the standard protocol
during bolus administration of intravenous contrast.
CONTRAST:  100mL OMNIPAQUE IOHEXOL 350 MG/ML SOLN

[Series 2: axial st · axial · 0.84mm/px · z∈[-635,-155]mm · 12 of 108 slices shown, 14 images]
[im 6/108  soft-tissue]
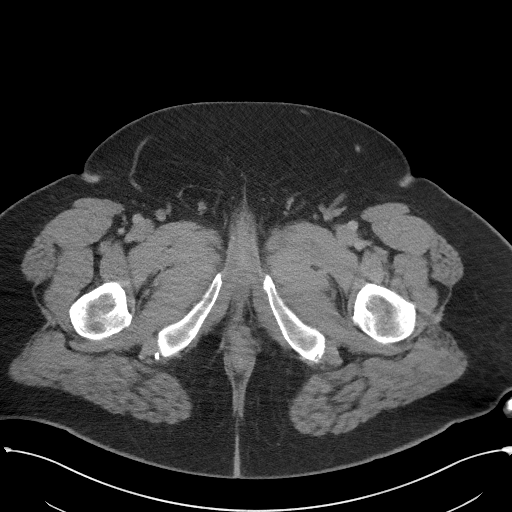
[im 6/108  bone]
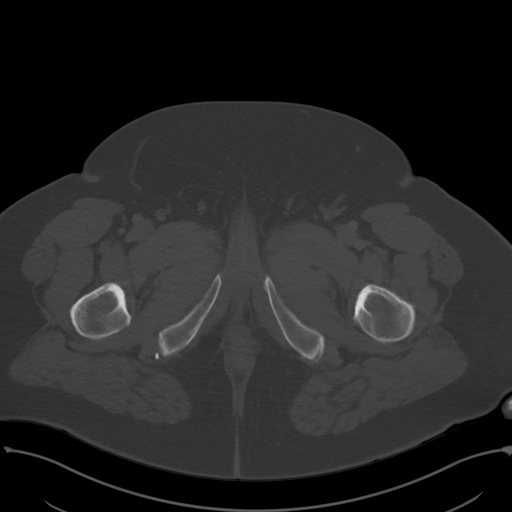
[im 17/108  soft-tissue]
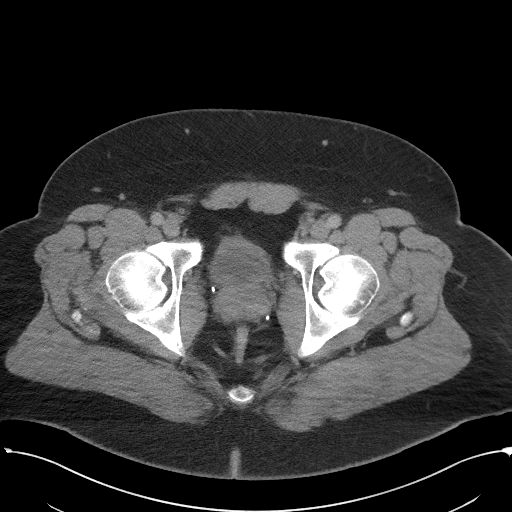
[im 22/108  soft-tissue]
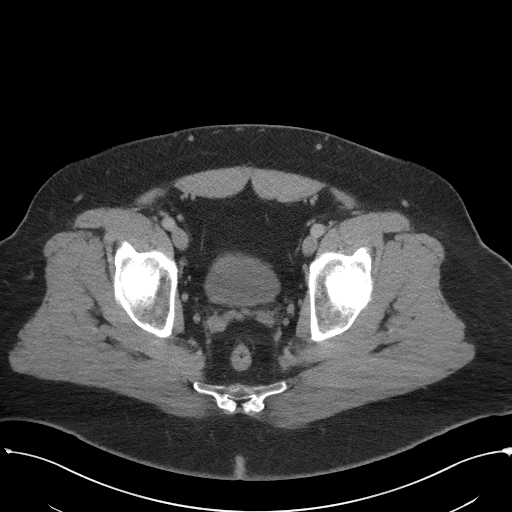
[im 33/108  soft-tissue]
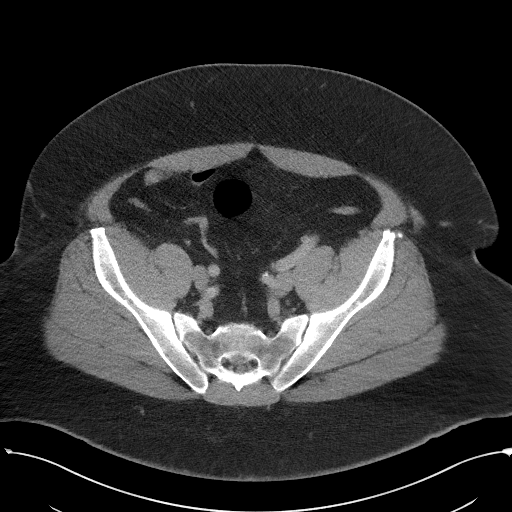
[im 43/108  soft-tissue]
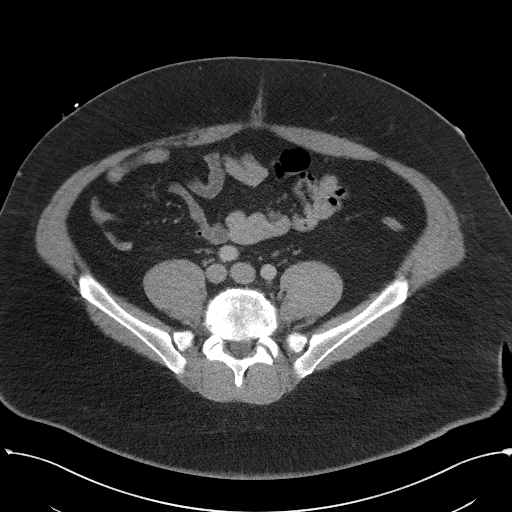
[im 49/108  soft-tissue]
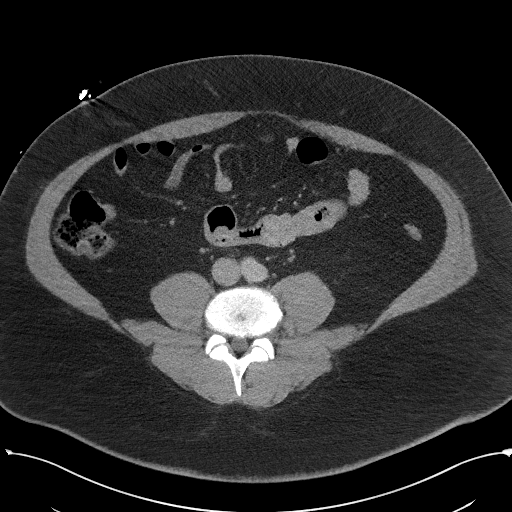
[im 59/108  soft-tissue]
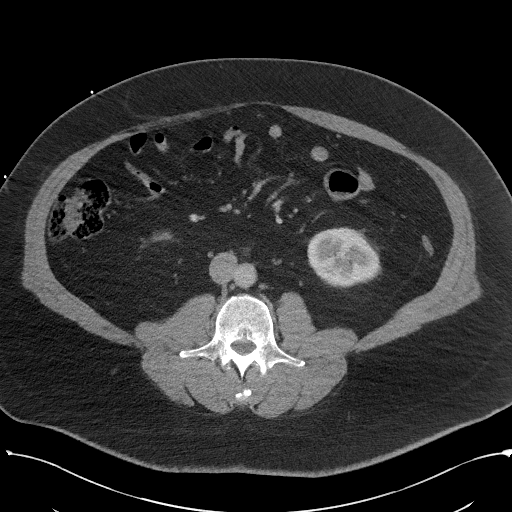
[im 65/108  soft-tissue]
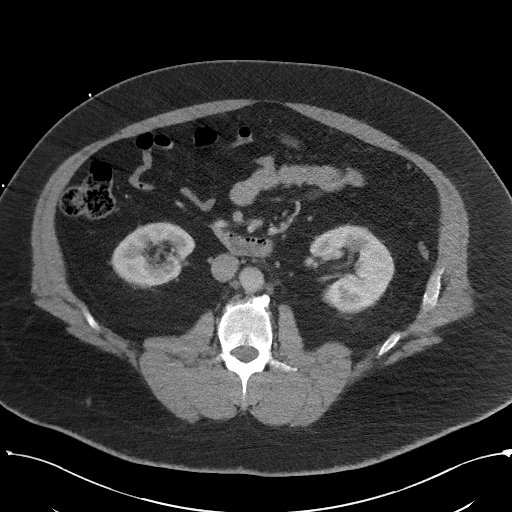
[im 75/108  soft-tissue]
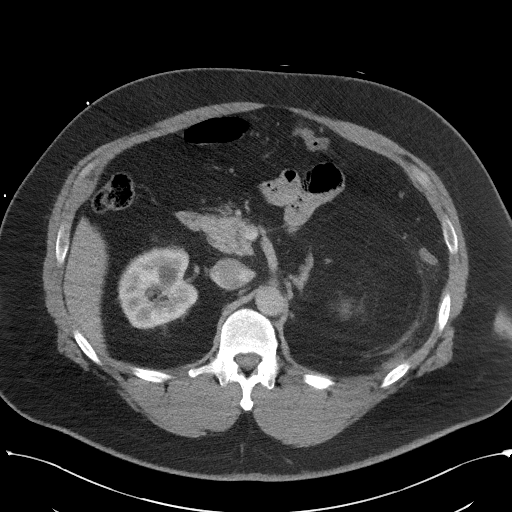
[im 75/108  bone]
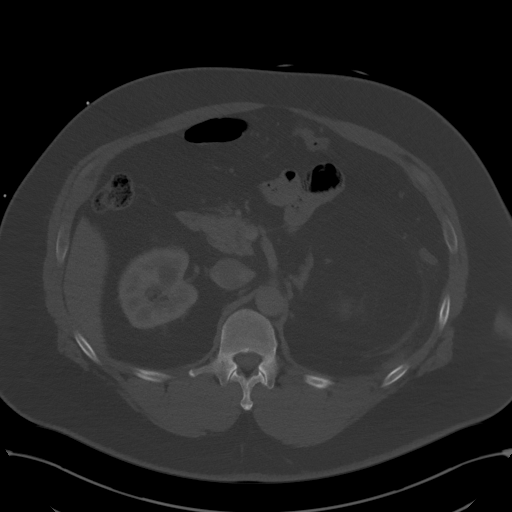
[im 86/108  soft-tissue]
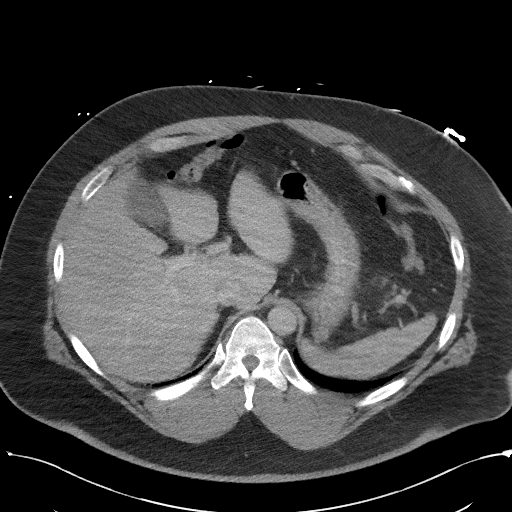
[im 91/108  soft-tissue]
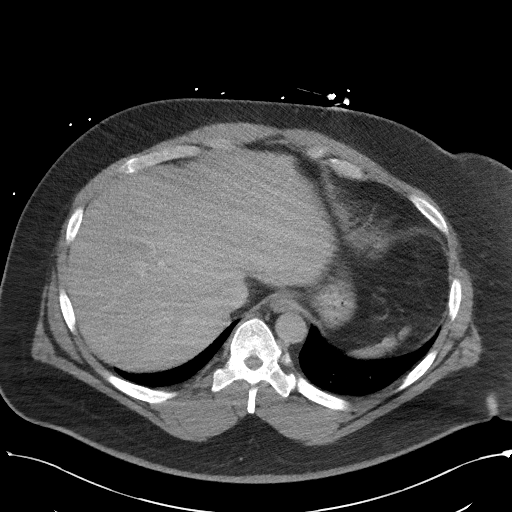
[im 102/108  soft-tissue]
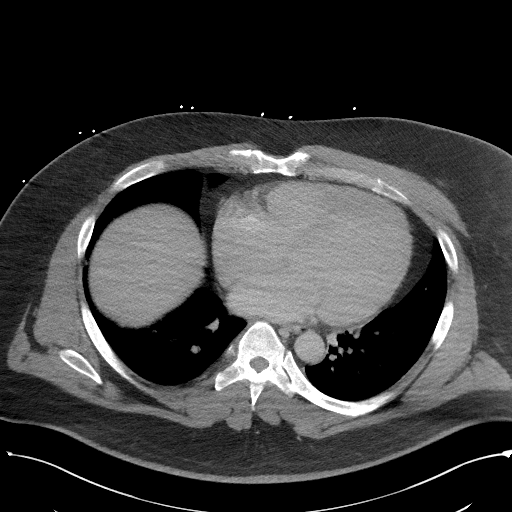

[Series 5: coronal st · coronal · 0.85mm/px · 3 of 124 slices shown]
[im 42/124  soft-tissue]
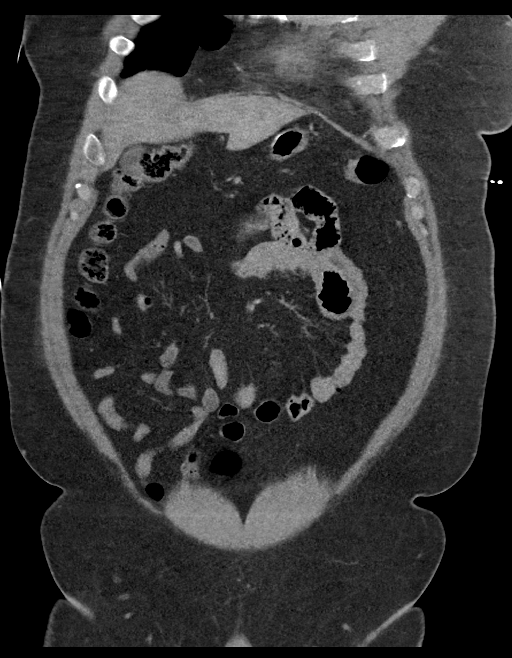
[im 55/124  soft-tissue]
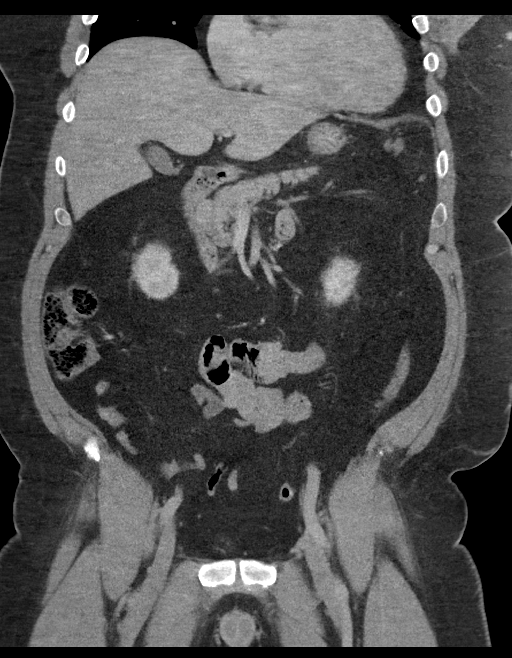
[im 69/124  soft-tissue]
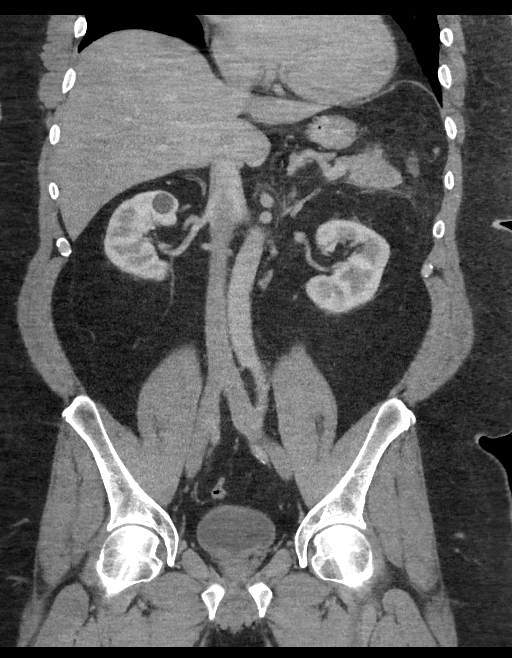

[15 of 46 positions shown; findings below may reference images not displayed]

FINDINGS: CTA CHEST FINDINGS

Cardiovascular: Satisfactory opacification of the pulmonary arteries
to the segmental level. No evidence of pulmonary embolism. Mildly
enlarged heart size. No pericardial effusion.

Mediastinum/Nodes: Thyroid is unremarkable. No axillary or
mediastinal adenopathy.

Lungs/Pleura: No pleural effusion or pneumothorax. Subsegmental
RIGHT lower lobe atelectasis.

Musculoskeletal: No chest wall abnormality. No acute or significant
osseous findings.

Review of the MIP images confirms the above findings.

CT ABDOMEN and PELVIS FINDINGS

Hepatobiliary: Focal fatty deposition adjacent to the falciform
ligament. Gallbladder is unremarkable. No intrahepatic or
extrahepatic biliary ductal dilation. Portal vein is patent.

Pancreas: There is expansion of the pancreatic tail with adjacent
fat stranding and interdigitating edema. No evidence of
pseudoaneurysm formation or significant adjacent organized fluid
collection. The pancreas demonstrates relatively homogeneous
enhancement.

Spleen: Spleen is unremarkable.

Adrenals/Urinary Tract: Adrenal glands are unremarkable. No
hydronephrosis. RIGHT renal cyst. Additional subcentimeter hypodense
lesions are too small to accurately characterize. Bladder is
decompressed.

Stomach/Bowel: Stomach is within normal limits. Appendix appears
normal. No evidence of bowel wall thickening, distention, or
inflammatory changes.

Vascular/Lymphatic: No significant vascular findings are present. No
enlarged abdominal or pelvic lymph nodes.

Reproductive: Prostate is present.

Other: No free air.  Fat containing RIGHT inguinal hernia.

Musculoskeletal: Degenerative changes of the lower lumbar spine with
mild retrolisthesis of L5-S1.

Review of the MIP images confirms the above findings.
IMPRESSION: 1. Constellation of findings are consistent with acute interstitial
pancreatitis. Recommend correlation with lipase levels.
2. No evidence of acute pulmonary embolism. No acute intrathoracic
process.

## 2020-02-26 MED ORDER — IOHEXOL 350 MG/ML SOLN
100.0000 mL | Freq: Once | INTRAVENOUS | Status: AC | PRN
  Administered 2020-02-26: 100 mL via INTRAVENOUS

## 2020-02-26 MED ORDER — SODIUM CHLORIDE 0.9 % IV BOLUS
1000.0000 mL | Freq: Once | INTRAVENOUS | Status: AC
  Administered 2020-02-26: 1000 mL via INTRAVENOUS

## 2020-02-26 MED ORDER — LACTATED RINGERS IV SOLN: INTRAVENOUS | Status: AC

## 2020-02-26 MED ORDER — ONDANSETRON HCL 4 MG/2ML IJ SOLN
4.0000 mg | Freq: Once | INTRAMUSCULAR | Status: AC
  Administered 2020-02-26: 4 mg via INTRAVENOUS
  Filled 2020-02-26: qty 2

## 2020-02-26 MED ORDER — LACTATED RINGERS IV BOLUS
1000.0000 mL | Freq: Once | INTRAVENOUS | Status: AC
  Administered 2020-02-26: 23:00:00 1000 mL via INTRAVENOUS

## 2020-02-26 MED ORDER — MORPHINE SULFATE (PF) 2 MG/ML IV SOLN
2.0000 mg | INTRAVENOUS | Status: DC | PRN
Start: 2020-02-26 — End: 2020-02-29

## 2020-02-26 MED ORDER — LACTATED RINGERS IV SOLN: INTRAVENOUS | Status: DC

## 2020-02-26 MED ORDER — ENOXAPARIN SODIUM 80 MG/0.8ML ~~LOC~~ SOLN
70.0000 mg | SUBCUTANEOUS | Status: DC
  Administered 2020-02-27 – 2020-02-29 (×3): 70 mg via SUBCUTANEOUS
  Filled 2020-02-26 (×3): qty 0.8

## 2020-02-26 MED ORDER — HYDRALAZINE HCL 20 MG/ML IJ SOLN: 10.0000 mg | INTRAMUSCULAR | Status: DC | PRN

## 2020-02-26 NOTE — ED Notes (Signed)
Gave patient ice water; ok per Dr Langston Masker.

## 2020-02-26 NOTE — H&P (Signed)
History and Physical    Kevin Baxter [id number] DOB: 1970/11/28 DOA: 02/26/2020  PCP: Colon Branch, MD  Patient coming from: Home.  Chief Complaint: Abdominal pain.  HPI: Kevin Baxter is a 50 y.o. male with history of hypertension and gout presents to the ER because of abdominal pain and nausea.  Patient states his abdominal pain started about 3 weeks ago mostly in the epigastric area which has been gradually worsening but when he started having nausea he decided to come to the ER.  Denies any chest pain or shortness of breath.  ED Course: In the ER patient labs show mildly elevated lipase and CT scan shows features concerning for pancreatitis.  LFTs were normal creatinine 1.3 triglycerides were normal.  High sensitive troponin was 30 and patient's BUN was 12 hemoglobin was 18.5 hematocrit 57.2.  Patient admitted for further work-up of acute pancreatitis.  Review of Systems: As per HPI, rest all negative.   Past Medical History:  Diagnosis Date  . Allergy   . Arthritis   . Fundic gland polyposis of stomach   . GERD with esophagitis   . Gout   . Hx of adenomatous colonic polyps   . Hypertension     Past Surgical History:  Procedure Laterality Date  . COLONOSCOPY  05/2019  . ESOPHAGOGASTRODUODENOSCOPY  05/2019  . HAND SURGERY Right    2006 for a FX     reports that he has never smoked. He has never used smokeless tobacco. He reports current alcohol use. He reports that he does not use drugs.  Allergies  Allergen Reactions  . Pseudoephedrine     REACTION: jittery    Family History  Problem Relation Age of Onset  . Cancer Mother        CERVICAL  . Hypertension Father   . Heart failure Father        no MI, d/t etoh  . Diabetes Sister   . Diabetes Sister   . Diabetes Paternal Aunt   . Diabetes Cousin   . Colon cancer Neg Hx   . Prostate cancer Neg Hx   . Stroke Neg Hx   . Esophageal cancer Neg Hx   . Rectal cancer Neg Hx   . Stomach cancer Neg Hx      Prior to Admission medications   Medication Sig Start Date End Date Taking? Authorizing Provider  allopurinol (ZYLOPRIM) 300 MG tablet Take 1 tablet (300 mg total) by mouth daily. 12/22/19  Yes Colon Branch, MD  esomeprazole (NEXIUM) 40 MG capsule Take 1 capsule (40 mg total) by mouth daily before breakfast. 02/20/20  Yes Gatha Mayer, MD  losartan (COZAAR) 100 MG tablet Take 1 tablet (100 mg total) by mouth daily. 02/25/20  Yes Paz, Alda Berthold, MD  colchicine 0.6 MG tablet Take 1 tablet (0.6 mg total) by mouth 2 (two) times daily as needed (gout flare). 04/14/19   Colon Branch, MD  dicyclomine (BENTYL) 20 MG tablet Take 1 tablet (20 mg total) by mouth every 6 (six) hours as needed for spasms. 04/22/19   Gatha Mayer, MD  tadalafil (CIALIS) 20 MG tablet Take 1 tablet (20 mg total) by mouth daily as needed for erectile dysfunction. 12/03/18   Colon Branch, MD    Physical Exam: Constitutional: Moderately built and nourished. Vitals:   02/26/20 2100 02/26/20 2130 02/26/20 2200 02/26/20 2303  BP: (!) 124/93 (!) 133/96 (!) 131/97 (!) 144/100  Pulse: (!) 108 Marland Kitchen)  109 (!) 111 (!) 109  Resp: 17 17 (!) 25 18  Temp:   98.4 F (36.9 C) 98.2 F (36.8 C)  TempSrc:   Oral Oral  SpO2: 96% 96% 96% 94%  Weight:      Height:       Eyes: Anicteric no pallor. ENMT: No discharge from the ears eyes nose or mouth. Neck: No mass felt.  No neck rigidity. Respiratory: No rhonchi or crepitations. Cardiovascular: S1-S2 heard. Abdomen: Soft nontender bowel sounds present. Musculoskeletal: No edema. Skin: No rash. Neurologic: Alert awake oriented to time place and person.  Moves all extremities. Psychiatric: Appears normal.  Normal affect.   Labs on Admission: I have personally reviewed following labs and imaging studies  CBC: Recent Labs  Lab 02/26/20 1000  WBC 7.1  HGB 18.5*  HCT 57.2*  MCV 79.4*  PLT 109   Basic Metabolic Panel: Recent Labs  Lab 02/26/20 1000  NA 138  K 4.2  CL 101  CO2  26  GLUCOSE 112*  BUN 12  CREATININE 1.33*  CALCIUM 9.4   GFR: Estimated Creatinine Clearance: 97.9 mL/min (A) (by C-G formula based on SCr of 1.33 mg/dL (H)). Liver Function Tests: Recent Labs  Lab 02/26/20 1000  AST 16  ALT 16  ALKPHOS 62  BILITOT 2.1*  PROT 8.5*  ALBUMIN 4.2   Recent Labs  Lab 02/26/20 1000  LIPASE 82*   No results for input(s): AMMONIA in the last 168 hours. Coagulation Profile: No results for input(s): INR, PROTIME in the last 168 hours. Cardiac Enzymes: No results for input(s): CKTOTAL, CKMB, CKMBINDEX, TROPONINI in the last 168 hours. BNP (last 3 results) No results for input(s): PROBNP in the last 8760 hours. HbA1C: No results for input(s): HGBA1C in the last 72 hours. CBG: No results for input(s): GLUCAP in the last 168 hours. Lipid Profile: Recent Labs    02/26/20 1000  TRIG 78   Thyroid Function Tests: No results for input(s): TSH, T4TOTAL, FREET4, T3FREE, THYROIDAB in the last 72 hours. Anemia Panel: No results for input(s): VITAMINB12, FOLATE, FERRITIN, TIBC, IRON, RETICCTPCT in the last 72 hours. Urine analysis:    Component Value Date/Time   COLORURINE AMBER (A) 02/26/2020 0945   APPEARANCEUR CLEAR 02/26/2020 0945   LABSPEC 1.030 02/26/2020 0945   PHURINE 6.0 02/26/2020 0945   GLUCOSEU NEGATIVE 02/26/2020 0945   HGBUR MODERATE (A) 02/26/2020 0945   BILIRUBINUR SMALL (A) 02/26/2020 0945   KETONESUR 15 (A) 02/26/2020 0945   PROTEINUR NEGATIVE 02/26/2020 0945   NITRITE NEGATIVE 02/26/2020 0945   LEUKOCYTESUR NEGATIVE 02/26/2020 0945   Sepsis Labs: @LABRCNTIP (procalcitonin:4,lacticidven:4) ) Recent Results (from the past 240 hour(s))  SARS Coronavirus 2 by RT PCR (hospital order, performed in [place] hospital lab) Nasopharyngeal Nasopharyngeal Swab     Status: None   Collection Time: 02/26/20 12:44 PM   Specimen: Nasopharyngeal Swab  Result Value Ref Range Status   SARS Coronavirus 2 NEGATIVE NEGATIVE Final     Comment: (NOTE) SARS-CoV-2 target nucleic acids are NOT DETECTED.  The SARS-CoV-2 RNA is generally detectable in upper and lower respiratory specimens during the acute phase of infection. The lowest concentration of SARS-CoV-2 viral copies this assay can detect is 250 copies / mL. A negative result does not preclude SARS-CoV-2 infection and should not be used as the sole basis for treatment or other patient management decisions.  A negative result may occur with improper specimen collection / handling, submission of specimen other than nasopharyngeal swab, presence of viral  mutation(s) within the areas targeted by this assay, and inadequate number of viral copies (<250 copies / mL). A negative result must be combined with clinical observations, patient history, and epidemiological information.  Fact Sheet for Patients:   StrictlyIdeas.no  Fact Sheet for Healthcare Providers: BankingDealers.co.za  This test is not yet approved or  cleared by the [place] FDA and has been authorized for detection and/or diagnosis of SARS-CoV-2 by FDA under an Emergency Use Authorization (EUA).  This EUA will remain in effect (meaning this test can be used) for the duration of the COVID-19 declaration under Section 564(b)(1) of the Act, 21 U.S.C. section 360bbb-3(b)(1), unless the authorization is terminated or revoked sooner.  Performed at [facility], [address] [place], [place] [address]      Radiological Exams on Admission: CT Angio Chest PE W and/or Wo Contrast  Result Date: 02/26/2020 CLINICAL DATA:  Abdominal pain for 3 weeks EXAM: CT ANGIOGRAPHY CHEST CT ABDOMEN AND PELVIS WITH CONTRAST TECHNIQUE: Multidetector CT imaging of the chest was performed using the standard protocol during bolus administration of intravenous contrast. Multiplanar CT image reconstructions and MIPs were obtained to evaluate the vascular anatomy.  Multidetector CT imaging of the abdomen and pelvis was performed using the standard protocol during bolus administration of intravenous contrast. CONTRAST:  145mL OMNIPAQUE IOHEXOL 350 MG/ML SOLN COMPARISON:  None. FINDINGS: CTA CHEST FINDINGS Cardiovascular: Satisfactory opacification of the pulmonary arteries to the segmental level. No evidence of pulmonary embolism. Mildly enlarged heart size. No pericardial effusion. Mediastinum/Nodes: Thyroid is unremarkable. No axillary or mediastinal adenopathy. Lungs/Pleura: No pleural effusion or pneumothorax. Subsegmental RIGHT lower lobe atelectasis. Musculoskeletal: No chest wall abnormality. No acute or significant osseous findings. Review of the MIP images confirms the above findings. CT ABDOMEN and PELVIS FINDINGS Hepatobiliary: Focal fatty deposition adjacent to the falciform ligament. Gallbladder is unremarkable. No intrahepatic or extrahepatic biliary ductal dilation. Portal vein is patent. Pancreas: There is expansion of the pancreatic tail with adjacent fat stranding and interdigitating edema. No evidence of pseudoaneurysm formation or significant adjacent organized fluid collection. The pancreas demonstrates relatively homogeneous enhancement. Spleen: Spleen is unremarkable. Adrenals/Urinary Tract: Adrenal glands are unremarkable. No hydronephrosis. RIGHT renal cyst. Additional subcentimeter hypodense lesions are too small to accurately characterize. Bladder is decompressed. Stomach/Bowel: Stomach is within normal limits. Appendix appears normal. No evidence of bowel wall thickening, distention, or inflammatory changes. Vascular/Lymphatic: No significant vascular findings are present. No enlarged abdominal or pelvic lymph nodes. Reproductive: Prostate is present. Other: No free air.  Fat containing RIGHT inguinal hernia. Musculoskeletal: Degenerative changes of the lower lumbar spine with mild retrolisthesis of L5-S1. Review of the MIP images confirms the above  findings. IMPRESSION: 1. Constellation of findings are consistent with acute interstitial pancreatitis. Recommend correlation with lipase levels. 2. No evidence of acute pulmonary embolism. No acute intrathoracic process. Electronically Signed   By: Valentino Saxon MD   On: 02/26/2020 11:58   CT ABDOMEN PELVIS W CONTRAST  Result Date: 02/26/2020 CLINICAL DATA:  Abdominal pain for 3 weeks EXAM: CT ANGIOGRAPHY CHEST CT ABDOMEN AND PELVIS WITH CONTRAST TECHNIQUE: Multidetector CT imaging of the chest was performed using the standard protocol during bolus administration of intravenous contrast. Multiplanar CT image reconstructions and MIPs were obtained to evaluate the vascular anatomy. Multidetector CT imaging of the abdomen and pelvis was performed using the standard protocol during bolus administration of intravenous contrast. CONTRAST:  162mL OMNIPAQUE IOHEXOL 350 MG/ML SOLN COMPARISON:  None. FINDINGS: CTA CHEST FINDINGS Cardiovascular: Satisfactory  opacification of the pulmonary arteries to the segmental level. No evidence of pulmonary embolism. Mildly enlarged heart size. No pericardial effusion. Mediastinum/Nodes: Thyroid is unremarkable. No axillary or mediastinal adenopathy. Lungs/Pleura: No pleural effusion or pneumothorax. Subsegmental RIGHT lower lobe atelectasis. Musculoskeletal: No chest wall abnormality. No acute or significant osseous findings. Review of the MIP images confirms the above findings. CT ABDOMEN and PELVIS FINDINGS Hepatobiliary: Focal fatty deposition adjacent to the falciform ligament. Gallbladder is unremarkable. No intrahepatic or extrahepatic biliary ductal dilation. Portal vein is patent. Pancreas: There is expansion of the pancreatic tail with adjacent fat stranding and interdigitating edema. No evidence of pseudoaneurysm formation or significant adjacent organized fluid collection. The pancreas demonstrates relatively homogeneous enhancement. Spleen: Spleen is unremarkable.  Adrenals/Urinary Tract: Adrenal glands are unremarkable. No hydronephrosis. RIGHT renal cyst. Additional subcentimeter hypodense lesions are too small to accurately characterize. Bladder is decompressed. Stomach/Bowel: Stomach is within normal limits. Appendix appears normal. No evidence of bowel wall thickening, distention, or inflammatory changes. Vascular/Lymphatic: No significant vascular findings are present. No enlarged abdominal or pelvic lymph nodes. Reproductive: Prostate is present. Other: No free air.  Fat containing RIGHT inguinal hernia. Musculoskeletal: Degenerative changes of the lower lumbar spine with mild retrolisthesis of L5-S1. Review of the MIP images confirms the above findings. IMPRESSION: 1. Constellation of findings are consistent with acute interstitial pancreatitis. Recommend correlation with lipase levels. 2. No evidence of acute pulmonary embolism. No acute intrathoracic process. Electronically Signed   By: Valentino Saxon MD   On: 02/26/2020 11:58    EKG: Independently reviewed.  Sinus tachycardia with nonspecific ST changes.  Assessment/Plan Principal Problem:   Pancreatitis, acute Active Problems:   Gout   Essential hypertension   Acute pancreatitis    1. Acute pancreatitis cause not clear.  Patient denies drinking alcohol.  CT scan does not show any definite evidence of gallstones.  Triglycerides are normal.  May consult gastroenterologist in the morning.  Continue with aggressive IV hydration and follow hematocrit BUN/creatinine.  N.p.o. for now and pain medications. 2. Hypertension we will keep patient on as needed IV hydralazine for now. 3. History of gout has not taking allopurinol recently. 4. Elevated hemoglobin hematocrit could be from dehydration.  Follow CBC closely.   DVT prophylaxis: Lovenox. Code Status: Full code. Family Communication: Discussed with patient. Disposition Plan: Home. Consults called: None. Admission status:  Observation.   Rise Patience MD [facility] Pager [phone].  If 7PM-7AM, please contact night-coverage [email or web address] Password [facility]  02/26/2020, 11:44 PM

## 2020-02-26 NOTE — ED Notes (Signed)
ED Provider at bedside. 

## 2020-02-26 NOTE — ED Triage Notes (Signed)
Reports abdominal pressure/aching for the last 3 weeks.  Reports he feels bloated all the time.  Reports using casteroil yesterday and had a bm.  C/o nausea this morning.  Denies being around anyone with similar symptoms.

## 2020-02-26 NOTE — ED Notes (Signed)
[facility] at bedside to transport patient to [facility]. No acute distress noted.

## 2020-02-26 NOTE — ED Provider Notes (Signed)
[facility] EMERGENCY DEPARTMENT Provider Note   CSN: [id number] Arrival date & time: 02/26/20  [id number]     History Chief Complaint  Patient presents with  . Abdominal Pain    Kevin Baxter is a 50 y.o. male.  HPI    Was trying to lose weight, cut out beef, pork, then went on a fast then 2 days later (3 weeks ago) developed abdominal bloating, across top of abdomen, has not been eating because of low appetite No energy, severe fatigue, has not been eating over the last several weeks Felt like nausea, if could throw up would feel better but not vomiting Abdominal pain 7/10, currently not having pain BP high because nervous, hasn't slept in 3 weeks because nervous 3 weeks ago Slight cough, went away a few weeks ago Feels short of breath when getting bloated feeling Had castor oil yesterday and diarrhea Had not had many BM before but not constipated No fever No urinary symptoms No known sick contacts, works from home Has not had covid 19 vaccine No sore throat, runny nose Low back pain for years Denies smoking, etoh, drugs   Past Medical History:  Diagnosis Date  . Allergy   . Arthritis   . Fundic gland polyposis of stomach   . GERD with esophagitis   . Gout   . Hx of adenomatous colonic polyps   . Hypertension     Patient Active Problem List   Diagnosis Date Noted  . Pancreatitis, acute 02/26/2020  . Hx of adenomatous colonic polyps   . Morbid obesity ([place]) 04/22/2015  . PCP NOTES >>>>>>>>>>>>>>>>>>>>>>>>> 04/22/2015  . Hyperglycemia 03/11/2014  . Erectile dysfunction 07/21/2011  . Annual physical exam 01/05/2011  . Gout 03/14/2006  . Essential hypertension 03/14/2006  . ALLERGIC RHINITIS 03/14/2006    Past Surgical History:  Procedure Laterality Date  . COLONOSCOPY  05/2019  . ESOPHAGOGASTRODUODENOSCOPY  05/2019  . HAND SURGERY Right    2006 for a FX       Family History  Problem Relation Age of Onset  . Cancer Mother         CERVICAL  . Hypertension Father   . Heart failure Father        no MI, d/t etoh  . Diabetes Sister   . Diabetes Sister   . Diabetes Paternal Aunt   . Diabetes Cousin   . Colon cancer Neg Hx   . Prostate cancer Neg Hx   . Stroke Neg Hx   . Esophageal cancer Neg Hx   . Rectal cancer Neg Hx   . Stomach cancer Neg Hx     Social History   Tobacco Use  . Smoking status: Never Smoker  . Smokeless tobacco: Never Used  Vaping Use  . Vaping Use: Never used  Substance Use Topics  . Alcohol use: Yes    Comment: very rare   . Drug use: No    Home Medications Prior to Admission medications   Medication Sig Start Date End Date Taking? Authorizing Provider  allopurinol (ZYLOPRIM) 300 MG tablet Take 1 tablet (300 mg total) by mouth daily. 12/22/19  Yes Colon Branch, MD  esomeprazole (NEXIUM) 40 MG capsule Take 1 capsule (40 mg total) by mouth daily before breakfast. 02/20/20  Yes Gatha Mayer, MD  losartan (COZAAR) 100 MG tablet Take 1 tablet (100 mg total) by mouth daily. 02/25/20  Yes Paz, Alda Berthold, MD  colchicine 0.6 MG tablet Take 1 tablet (0.6 mg total)  by mouth 2 (two) times daily as needed (gout flare). 04/14/19   Colon Branch, MD  dicyclomine (BENTYL) 20 MG tablet Take 1 tablet (20 mg total) by mouth every 6 (six) hours as needed for spasms. 04/22/19   Gatha Mayer, MD  tadalafil (CIALIS) 20 MG tablet Take 1 tablet (20 mg total) by mouth daily as needed for erectile dysfunction. 12/03/18   Colon Branch, MD    Allergies    Pseudoephedrine  Review of Systems   Review of Systems  Constitutional: Positive for fatigue. Negative for fever.  HENT: Negative for sore throat.   Eyes: Negative for visual disturbance.  Respiratory: Positive for cough and shortness of breath.   Cardiovascular: Negative for chest pain.  Gastrointestinal: Positive for abdominal pain and nausea. Negative for diarrhea and vomiting.  Genitourinary: Negative for difficulty urinating.  Musculoskeletal: Negative  for back pain and neck stiffness.  Skin: Negative for rash.  Neurological: Negative for syncope and headaches.    Physical Exam Updated Vital Signs BP (!) 145/104 (BP Location: Right Arm)   Pulse (!) 117   Temp 98.4 F (36.9 C) (Oral)   Resp (!) 22   Ht 6\' 1"  (1.854 m)   Wt (!) 143.3 kg   SpO2 96%   BMI 41.69 kg/m   Physical Exam Vitals and nursing note reviewed.  Constitutional:      General: He is not in acute distress.    Appearance: He is well-developed and well-nourished. He is not diaphoretic.  HENT:     Head: Normocephalic and atraumatic.  Eyes:     Extraocular Movements: EOM normal.     Conjunctiva/sclera: Conjunctivae normal.  Cardiovascular:     Rate and Rhythm: Regular rhythm. Tachycardia present.     Pulses: Intact distal pulses.     Heart sounds: Normal heart sounds. No murmur heard. No friction rub. No gallop.   Pulmonary:     Effort: Pulmonary effort is normal. No respiratory distress.     Breath sounds: Normal breath sounds. No wheezing or rales.  Abdominal:     General: There is no distension.     Palpations: Abdomen is soft.     Tenderness: There is abdominal tenderness (mild, worse in epigastrium). There is no guarding.  Musculoskeletal:        General: No edema.     Cervical back: Normal range of motion.  Skin:    General: Skin is warm and dry.  Neurological:     Mental Status: He is alert and oriented to person, place, and time.     ED Results / Procedures / Treatments   Labs (all labs ordered are listed, but only abnormal results are displayed) Labs Reviewed  LIPASE, BLOOD - Abnormal; Notable for the following components:      Result Value   Lipase 82 (*)    All other components within normal limits  COMPREHENSIVE METABOLIC PANEL - Abnormal; Notable for the following components:   Glucose, Bld 112 (*)    Creatinine, Ser 1.33 (*)    Total Protein 8.5 (*)    Total Bilirubin 2.1 (*)    All other components within normal limits  CBC -  Abnormal; Notable for the following components:   RBC 7.20 (*)    Hemoglobin 18.5 (*)    HCT 57.2 (*)    MCV 79.4 (*)    MCH 25.7 (*)    RDW 17.2 (*)    All other components within normal limits  URINALYSIS, ROUTINE  W REFLEX MICROSCOPIC - Abnormal; Notable for the following components:   Color, Urine AMBER (*)    Hgb urine dipstick MODERATE (*)    Bilirubin Urine SMALL (*)    Ketones, ur 15 (*)    All other components within normal limits  URINALYSIS, MICROSCOPIC (REFLEX) - Abnormal; Notable for the following components:   Bacteria, UA MANY (*)    All other components within normal limits  TROPONIN I (HIGH SENSITIVITY) - Abnormal; Notable for the following components:   Troponin I (High Sensitivity) 30 (*)    All other components within normal limits  TROPONIN I (HIGH SENSITIVITY) - Abnormal; Notable for the following components:   Troponin I (High Sensitivity) 33 (*)    All other components within normal limits  SARS CORONAVIRUS 2 BY RT PCR [facility] ORDER, [place] [facility])  TRIGLYCERIDES    EKG EKG Interpretation  Date/Time:  Thursday February 26 2020 09:44:11 EST Ventricular Rate:  125 PR Interval:  162 QRS Duration: 98 QT Interval:  300 QTC Calculation: 433 R Axis:   -23 Text Interpretation: Sinus tachycardia Biatrial enlargement Minimal voltage criteria for LVH, may be normal variant ( Cornell product ) Abnormal ECG No previous ECGs available Confirmed by Gareth Morgan [phone]) on 02/26/2020 10:51:01 AM   Radiology CT Angio Chest PE W and/or Wo Contrast  Result Date: 02/26/2020 CLINICAL DATA:  Abdominal pain for 3 weeks EXAM: CT ANGIOGRAPHY CHEST CT ABDOMEN AND PELVIS WITH CONTRAST TECHNIQUE: Multidetector CT imaging of the chest was performed using the standard protocol during bolus administration of intravenous contrast. Multiplanar CT image reconstructions and MIPs were obtained to evaluate the vascular anatomy. Multidetector CT imaging of the  abdomen and pelvis was performed using the standard protocol during bolus administration of intravenous contrast. CONTRAST:  161mL OMNIPAQUE IOHEXOL 350 MG/ML SOLN COMPARISON:  None. FINDINGS: CTA CHEST FINDINGS Cardiovascular: Satisfactory opacification of the pulmonary arteries to the segmental level. No evidence of pulmonary embolism. Mildly enlarged heart size. No pericardial effusion. Mediastinum/Nodes: Thyroid is unremarkable. No axillary or mediastinal adenopathy. Lungs/Pleura: No pleural effusion or pneumothorax. Subsegmental RIGHT lower lobe atelectasis. Musculoskeletal: No chest wall abnormality. No acute or significant osseous findings. Review of the MIP images confirms the above findings. CT ABDOMEN and PELVIS FINDINGS Hepatobiliary: Focal fatty deposition adjacent to the falciform ligament. Gallbladder is unremarkable. No intrahepatic or extrahepatic biliary ductal dilation. Portal vein is patent. Pancreas: There is expansion of the pancreatic tail with adjacent fat stranding and interdigitating edema. No evidence of pseudoaneurysm formation or significant adjacent organized fluid collection. The pancreas demonstrates relatively homogeneous enhancement. Spleen: Spleen is unremarkable. Adrenals/Urinary Tract: Adrenal glands are unremarkable. No hydronephrosis. RIGHT renal cyst. Additional subcentimeter hypodense lesions are too small to accurately characterize. Bladder is decompressed. Stomach/Bowel: Stomach is within normal limits. Appendix appears normal. No evidence of bowel wall thickening, distention, or inflammatory changes. Vascular/Lymphatic: No significant vascular findings are present. No enlarged abdominal or pelvic lymph nodes. Reproductive: Prostate is present. Other: No free air.  Fat containing RIGHT inguinal hernia. Musculoskeletal: Degenerative changes of the lower lumbar spine with mild retrolisthesis of L5-S1. Review of the MIP images confirms the above findings. IMPRESSION: 1.  Constellation of findings are consistent with acute interstitial pancreatitis. Recommend correlation with lipase levels. 2. No evidence of acute pulmonary embolism. No acute intrathoracic process. Electronically Signed   By: Valentino Saxon MD   On: 02/26/2020 11:58   CT ABDOMEN PELVIS W CONTRAST  Result Date: 02/26/2020 CLINICAL DATA:  Abdominal pain for  3 weeks EXAM: CT ANGIOGRAPHY CHEST CT ABDOMEN AND PELVIS WITH CONTRAST TECHNIQUE: Multidetector CT imaging of the chest was performed using the standard protocol during bolus administration of intravenous contrast. Multiplanar CT image reconstructions and MIPs were obtained to evaluate the vascular anatomy. Multidetector CT imaging of the abdomen and pelvis was performed using the standard protocol during bolus administration of intravenous contrast. CONTRAST:  186mL OMNIPAQUE IOHEXOL 350 MG/ML SOLN COMPARISON:  None. FINDINGS: CTA CHEST FINDINGS Cardiovascular: Satisfactory opacification of the pulmonary arteries to the segmental level. No evidence of pulmonary embolism. Mildly enlarged heart size. No pericardial effusion. Mediastinum/Nodes: Thyroid is unremarkable. No axillary or mediastinal adenopathy. Lungs/Pleura: No pleural effusion or pneumothorax. Subsegmental RIGHT lower lobe atelectasis. Musculoskeletal: No chest wall abnormality. No acute or significant osseous findings. Review of the MIP images confirms the above findings. CT ABDOMEN and PELVIS FINDINGS Hepatobiliary: Focal fatty deposition adjacent to the falciform ligament. Gallbladder is unremarkable. No intrahepatic or extrahepatic biliary ductal dilation. Portal vein is patent. Pancreas: There is expansion of the pancreatic tail with adjacent fat stranding and interdigitating edema. No evidence of pseudoaneurysm formation or significant adjacent organized fluid collection. The pancreas demonstrates relatively homogeneous enhancement. Spleen: Spleen is unremarkable. Adrenals/Urinary Tract:  Adrenal glands are unremarkable. No hydronephrosis. RIGHT renal cyst. Additional subcentimeter hypodense lesions are too small to accurately characterize. Bladder is decompressed. Stomach/Bowel: Stomach is within normal limits. Appendix appears normal. No evidence of bowel wall thickening, distention, or inflammatory changes. Vascular/Lymphatic: No significant vascular findings are present. No enlarged abdominal or pelvic lymph nodes. Reproductive: Prostate is present. Other: No free air.  Fat containing RIGHT inguinal hernia. Musculoskeletal: Degenerative changes of the lower lumbar spine with mild retrolisthesis of L5-S1. Review of the MIP images confirms the above findings. IMPRESSION: 1. Constellation of findings are consistent with acute interstitial pancreatitis. Recommend correlation with lipase levels. 2. No evidence of acute pulmonary embolism. No acute intrathoracic process. Electronically Signed   By: Valentino Saxon MD   On: 02/26/2020 11:58    Procedures Procedures   Medications Ordered in ED Medications  lactated ringers infusion (has no administration in time range)  sodium chloride 0.9 % bolus 1,000 mL (0 mLs Intravenous Stopped 02/26/20 1235)  ondansetron (ZOFRAN) injection 4 mg (4 mg Intravenous Given 02/26/20 1117)  iohexol (OMNIPAQUE) 350 MG/ML injection 100 mL (100 mLs Intravenous Contrast Given 02/26/20 1129)    ED Course  I have reviewed the triage vital signs and the nursing notes.  Pertinent labs & imaging results that were available during my care of the patient were reviewed by me and considered in my medical decision making (see chart for details).  Clinical Course as of 02/26/20 1806  Thu Feb 26, 2020  1250 CT Angio Chest PE W and/or Wo Contrast [WF]    Clinical Course User Index [WF] Aron Baba   MDM Rules/Calculators/A&P                           205 150 9199 male with history of GERD, hyperlipidemia presents with concern for epigastric abdominal  pain, nausea, inability to eat, dyspnea.  Presents tachycardic with rate in the 120s. Given the tachycardia, concern for shortness of breath, elevated troponin, considered high risk for PE and CT ordered. Given abdominal pain history of nausea, inability to tolerate po considered appendciitis, cholecystitis, SBO or other mass.  CT PE study and CT abdomen pelvis show signs of pancreatitis. Lipase mildly elevated. Persistent tachycardia despite fluids. Denies etoh  use.  Ordered triglycerides, no clear new medications prior to this. Possible related to fasting but unclear etiology at this time.  Given tachycardia, elevated troponin, possible SIRS response to pancreatitis with unknown pancreatitis etiology, will admit for further care with continued hydration, nausea control. Pain is improved.     Final Clinical Impression(s) / ED Diagnoses Final diagnoses:  Acute pancreatitis without infection or necrosis, unspecified pancreatitis type    Rx / DC Orders ED Discharge Orders    None       Gareth Morgan, MD 02/26/20 1827

## 2020-02-27 DIAGNOSIS — Z833 Family history of diabetes mellitus: Secondary | ICD-10-CM | POA: Diagnosis not present

## 2020-02-27 DIAGNOSIS — K859 Acute pancreatitis without necrosis or infection, unspecified: Secondary | ICD-10-CM | POA: Diagnosis present

## 2020-02-27 DIAGNOSIS — Z20822 Contact with and (suspected) exposure to covid-19: Secondary | ICD-10-CM | POA: Diagnosis present

## 2020-02-27 DIAGNOSIS — Z8249 Family history of ischemic heart disease and other diseases of the circulatory system: Secondary | ICD-10-CM | POA: Diagnosis not present

## 2020-02-27 DIAGNOSIS — K219 Gastro-esophageal reflux disease without esophagitis: Secondary | ICD-10-CM | POA: Diagnosis present

## 2020-02-27 DIAGNOSIS — M1A9XX0 Chronic gout, unspecified, without tophus (tophi): Secondary | ICD-10-CM | POA: Diagnosis present

## 2020-02-27 DIAGNOSIS — Z809 Family history of malignant neoplasm, unspecified: Secondary | ICD-10-CM | POA: Diagnosis not present

## 2020-02-27 DIAGNOSIS — Z888 Allergy status to other drugs, medicaments and biological substances status: Secondary | ICD-10-CM | POA: Diagnosis not present

## 2020-02-27 DIAGNOSIS — Z79899 Other long term (current) drug therapy: Secondary | ICD-10-CM | POA: Diagnosis not present

## 2020-02-27 DIAGNOSIS — Z86010 Personal history of colonic polyps: Secondary | ICD-10-CM | POA: Diagnosis not present

## 2020-02-27 DIAGNOSIS — R7989 Other specified abnormal findings of blood chemistry: Secondary | ICD-10-CM | POA: Diagnosis present

## 2020-02-27 DIAGNOSIS — I10 Essential (primary) hypertension: Secondary | ICD-10-CM | POA: Diagnosis present

## 2020-02-27 DIAGNOSIS — Z6841 Body Mass Index (BMI) 40.0 and over, adult: Secondary | ICD-10-CM | POA: Diagnosis not present

## 2020-02-27 LAB — BASIC METABOLIC PANEL
Anion gap: 9 (ref 5–15)
BUN: 11 mg/dL (ref 6–20)
CO2: 25 mmol/L (ref 22–32)
Calcium: 9 mg/dL (ref 8.9–10.3)
Chloride: 105 mmol/L (ref 98–111)
Creatinine, Ser: 1.21 mg/dL (ref 0.61–1.24)
GFR, Estimated: >60 mL/min (ref >60)
Glucose, Bld: 96 mg/dL (ref 70–99)
Potassium: 4.5 mmol/L (ref 3.5–5.1)
Sodium: 139 mmol/L (ref 135–145)

## 2020-02-27 LAB — HEPATIC FUNCTION PANEL
ALT: 15 U/L (ref 0–44)
AST: 12 U/L — ABNORMAL LOW (ref 15–41)
Albumin: 3.3 g/dL — ABNORMAL LOW (ref 3.5–5.0)
Alkaline Phosphatase: 49 U/L (ref 38–126)
Bilirubin, Direct: 0.4 mg/dL — ABNORMAL HIGH (ref 0.0–0.2)
Indirect Bilirubin: 1.3 mg/dL — ABNORMAL HIGH (ref 0.3–0.9)
Total Bilirubin: 1.7 mg/dL — ABNORMAL HIGH (ref 0.3–1.2)
Total Protein: 6.7 g/dL (ref 6.5–8.1)

## 2020-02-27 LAB — CBC
HCT: 53.8 % — ABNORMAL HIGH (ref 39.0–52.0)
Hemoglobin: 16.9 g/dL (ref 13.0–17.0)
MCH: 25.8 pg — ABNORMAL LOW (ref 26.0–34.0)
MCHC: 31.4 g/dL (ref 30.0–36.0)
MCV: 82 fL (ref 80.0–100.0)
Platelets: 214 10*3/uL (ref 150–400)
RBC: 6.56 MIL/uL — ABNORMAL HIGH (ref 4.22–5.81)
RDW: 17.2 % — ABNORMAL HIGH (ref 11.5–15.5)
WBC: 6.4 10*3/uL (ref 4.0–10.5)
nRBC: 0 % (ref 0.0–0.2)

## 2020-02-27 LAB — CBC WITH DIFFERENTIAL/PLATELET
Abs Immature Granulocytes: 0.01 10*3/uL (ref 0.00–0.07)
Basophils Absolute: 0 10*3/uL (ref 0.0–0.1)
Basophils Relative: 0 %
Eosinophils Absolute: 0.2 10*3/uL (ref 0.0–0.5)
Eosinophils Relative: 2 %
HCT: 53.1 % — ABNORMAL HIGH (ref 39.0–52.0)
Hemoglobin: 16.7 g/dL (ref 13.0–17.0)
Immature Granulocytes: 0 %
Lymphocytes Relative: 19 %
Lymphs Abs: 1.2 10*3/uL (ref 0.7–4.0)
MCH: 25.7 pg — ABNORMAL LOW (ref 26.0–34.0)
MCHC: 31.5 g/dL (ref 30.0–36.0)
MCV: 81.6 fL (ref 80.0–100.0)
Monocytes Absolute: 0.6 10*3/uL (ref 0.1–1.0)
Monocytes Relative: 10 %
Neutro Abs: 4.4 10*3/uL (ref 1.7–7.7)
Neutrophils Relative %: 69 %
Platelets: 214 10*3/uL (ref 150–400)
RBC: 6.51 MIL/uL — ABNORMAL HIGH (ref 4.22–5.81)
RDW: 16.7 % — ABNORMAL HIGH (ref 11.5–15.5)
WBC: 6.4 10*3/uL (ref 4.0–10.5)
nRBC: 0 % (ref 0.0–0.2)

## 2020-02-27 LAB — CREATININE, SERUM
Creatinine, Ser: 1.28 mg/dL — ABNORMAL HIGH (ref 0.61–1.24)
GFR, Estimated: >60 mL/min (ref >60)

## 2020-02-27 LAB — TROPONIN I (HIGH SENSITIVITY): Troponin I (High Sensitivity): 40 ng/L — ABNORMAL HIGH (ref <18)

## 2020-02-27 LAB — HIV ANTIBODY (ROUTINE TESTING W REFLEX): HIV Screen 4th Generation wRfx: NONREACTIVE

## 2020-02-27 LAB — TRIGLYCERIDES: Triglycerides: 60 mg/dL (ref <150)

## 2020-02-27 MED ORDER — ALPRAZOLAM 0.5 MG PO TABS
0.5000 mg | ORAL_TABLET | Freq: Two times a day (BID) | ORAL | Status: DC | PRN
  Administered 2020-02-27 – 2020-02-29 (×3): 0.5 mg via ORAL
  Filled 2020-02-27 (×3): qty 1

## 2020-02-27 MED ORDER — SIMETHICONE 80 MG PO CHEW
80.0000 mg | CHEWABLE_TABLET | Freq: Four times a day (QID) | ORAL | Status: DC | PRN
  Administered 2020-02-27 – 2020-02-29 (×2): 80 mg via ORAL
  Filled 2020-02-27 (×2): qty 1

## 2020-02-27 MED ORDER — PANTOPRAZOLE SODIUM 40 MG IV SOLR
40.0000 mg | Freq: Two times a day (BID) | INTRAVENOUS | Status: DC
Start: 2020-02-27 — End: 2020-02-27

## 2020-02-27 MED ORDER — PANTOPRAZOLE SODIUM 40 MG IV SOLR
40.0000 mg | INTRAVENOUS | Status: DC
  Administered 2020-02-27 – 2020-02-29 (×3): 40 mg via INTRAVENOUS
  Filled 2020-02-27 (×3): qty 40

## 2020-02-27 MED ORDER — ALUM & MAG HYDROXIDE-SIMETH 200-200-20 MG/5ML PO SUSP
30.0000 mL | Freq: Four times a day (QID) | ORAL | Status: DC | PRN
  Administered 2020-02-27 – 2020-02-29 (×2): 30 mL via ORAL
  Filled 2020-02-27 (×2): qty 30

## 2020-02-27 MED ORDER — ALLOPURINOL 300 MG PO TABS
300.0000 mg | ORAL_TABLET | Freq: Every day | ORAL | Status: DC
Start: 2020-02-27 — End: 2020-02-29
  Administered 2020-02-27: 300 mg via ORAL
  Filled 2020-02-27 (×2): qty 1

## 2020-02-27 MED ORDER — ONDANSETRON HCL 4 MG/2ML IJ SOLN
4.0000 mg | Freq: Four times a day (QID) | INTRAMUSCULAR | Status: DC | PRN
  Administered 2020-02-27: 4 mg via INTRAVENOUS
  Filled 2020-02-27: qty 2

## 2020-02-27 NOTE — Progress Notes (Signed)
PROGRESS NOTE  Kevin Baxter  DOB: [birth year]  PCP: Colon Branch, MD ZDG:[id number]  DOA: 02/26/2020  LOS: 0 days   Chief Complaint  Patient presents with  . Abdominal Pain   Brief narrative: Kevin Baxter is a 50 y.o. male with PMH significant for hypertension and gout. Patient presented to the ED on 1/27 with complaint of abdominal pain, nausea progressively worsening for last 3 weeks.  In the ED, patient was afebrile, tachycardic to 120s, blood pressure stable.  Labs with creatinine elevated 1.33, LFTs normal, lipase elevated to 82, troponin slightly elevated Urinalysis with amber color urine with moderate amount hemoglobin and many bacteria. CT scan of abdomen showed findings suggestive of acute pancreatitis in the area of pancreatic tail Patient was admitted to hospitalist service for acute pancreatitis.   Subjective: Patient was seen and examined this morning. Middle-aged African-American male.  Morbidly obese.  Abdominal pain controlled.  Not in distress.  Some nausea, no vomiting. I had a long conversation with him and his wife on the phone.  Patient is states he recently started taking herbal supplement.  Unclear at this time if it contributed to acute pancreatitis. Remains tachycardic between 100 and 110 bpm, blood pressure in 130s overnight Labs this morning with creatinine slightly better at 1.28.  Assessment/Plan: Acute pancreatitis -Unclear etiology.  Denies alcohol use. CT abdomen did not show any evidence of gallstone. Triglyceride level normal.  Recently started herbal supplement. -Currently on conservative management with IV fluid, IV morphine, IV Zofran, n.p.o. status. -LR@200  mill per hour. Hematocrit elevated in 50s. I will continue aggressive hydration for next 24 to 48 hours at least. -Protonix IV daily. -Suggested to avoid herbal supplement.  Elevated creatinine -Baseline creatinine 1.2 from April 2021. Slightly revealed creatinine to 1.33 on  admission. Creatinine improving with IV fluid. Recent Labs    05/20/19 1342 02/26/20 1000 02/27/20 0034  BUN 14 12 11   CREATININE 1.20 1.33* 1.28*  1.21   Essential hypertension -Home meds include losartan 100 mg daily -Blood pressure in 130s. -Continue to hold losartan.  Chronic gout -Continue allopurinol 300 mg daily.  Mobility: Encourage ambulation Code Status:   Code Status: Full Code  Nutritional status: Body mass index is 41.69 kg/m.     Diet Order            Diet NPO time specified Except for: Sips with Meds, Ice Chips  Diet effective now                 DVT prophylaxis: Lovenox subcu   Antimicrobials:  None Fluid: LR at 200 Consultants: None Family Communication:  Spoke with patient's wife on the phone  Status is: Observation  The patient will require care spanning > 2 midnights and should be moved to inpatient because: Needs aggressive IV hydration for acute pancreatitis.  Dispo: The patient is from: Home              Anticipated d/c is to: Home              Anticipated d/c date is: 3 days              Patient currently is not medically stable to d/c.   Difficult to place patient No       Infusions:  . lactated ringers 200 mL/hr at 02/27/20 0745    Scheduled Meds: . allopurinol  300 mg Oral Daily  . enoxaparin (LOVENOX) injection  70 mg Subcutaneous Q24H  . pantoprazole (  PROTONIX) IV  40 mg Intravenous Q24H    Antimicrobials: Anti-infectives (From admission, onward)   None      PRN meds: hydrALAZINE, morphine injection, ondansetron (ZOFRAN) IV   Objective: Vitals:   02/27/20 0212 02/27/20 0629  BP: (!) 137/102 (!) 136/100  Pulse: (!) 110 (!) 106  Resp: 17 18  Temp: 97.9 F (36.6 C) 98.7 F (37.1 C)  SpO2: 94% 96%    Intake/Output Summary (Last 24 hours) at 02/27/2020 0839 Last data filed at 02/27/2020 0602 Gross per 24 hour  Intake 3027.85 ml  Output 925 ml  Net 2102.85 ml   Filed Weights   02/26/20 0943  Weight:  (!) 143.3 kg   Weight change:  Body mass index is 41.69 kg/m.   Physical Exam: General exam: Morbidly obese middle-aged African-American male.  Pain controlled at this time Skin: No rashes, lesions or ulcers. HEENT: Atraumatic, normocephalic, no obvious bleeding Lungs: Clear to auscultation bilaterally CVS: Regular rate and rhythm, no murmur GI/Abd soft, nontender, nondistended, bowel sound present CNS: Alert, awake, oriented x3 Psychiatry: Mood appropriate Extremities: No pedal edema, no calf tenderness  Data Review: I have personally reviewed the laboratory data and studies available.  Recent Labs  Lab 02/26/20 1000 02/27/20 0034  WBC 7.1 6.4  6.4  NEUTROABS  --  4.4  HGB 18.5* 16.9  16.7  HCT 57.2* 53.8*  53.1*  MCV 79.4* 82.0  81.6  PLT 256 214  214   Recent Labs  Lab 02/26/20 1000 02/27/20 0034  NA 138 139  K 4.2 4.5  CL 101 105  CO2 26 25  GLUCOSE 112* 96  BUN 12 11  CREATININE 1.33* 1.28*  1.21  CALCIUM 9.4 9.0    F/u labs ordered  Signed, Terrilee Croak, MD [facility] 02/27/2020

## 2020-02-28 LAB — CBC WITH DIFFERENTIAL/PLATELET
Abs Immature Granulocytes: 0.02 10*3/uL (ref 0.00–0.07)
Basophils Absolute: 0 10*3/uL (ref 0.0–0.1)
Basophils Relative: 0 %
Eosinophils Absolute: 0.2 10*3/uL (ref 0.0–0.5)
Eosinophils Relative: 4 %
HCT: 49.6 % (ref 39.0–52.0)
Hemoglobin: 15.7 g/dL (ref 13.0–17.0)
Immature Granulocytes: 0 %
Lymphocytes Relative: 24 %
Lymphs Abs: 1.3 10*3/uL (ref 0.7–4.0)
MCH: 25.7 pg — ABNORMAL LOW (ref 26.0–34.0)
MCHC: 31.7 g/dL (ref 30.0–36.0)
MCV: 81.3 fL (ref 80.0–100.0)
Monocytes Absolute: 0.6 10*3/uL (ref 0.1–1.0)
Monocytes Relative: 11 %
Neutro Abs: 3.4 10*3/uL (ref 1.7–7.7)
Neutrophils Relative %: 61 %
Platelets: 198 10*3/uL (ref 150–400)
RBC: 6.1 MIL/uL — ABNORMAL HIGH (ref 4.22–5.81)
RDW: 15.3 % (ref 11.5–15.5)
WBC: 5.5 10*3/uL (ref 4.0–10.5)
nRBC: 0 % (ref 0.0–0.2)

## 2020-02-28 LAB — MAGNESIUM: Magnesium: 1.8 mg/dL (ref 1.7–2.4)

## 2020-02-28 LAB — BASIC METABOLIC PANEL
Anion gap: 11 (ref 5–15)
BUN: 13 mg/dL (ref 6–20)
CO2: 27 mmol/L (ref 22–32)
Calcium: 8.7 mg/dL — ABNORMAL LOW (ref 8.9–10.3)
Chloride: 102 mmol/L (ref 98–111)
Creatinine, Ser: 1.05 mg/dL (ref 0.61–1.24)
GFR, Estimated: >60 mL/min (ref >60)
Glucose, Bld: 81 mg/dL (ref 70–99)
Potassium: 4.2 mmol/L (ref 3.5–5.1)
Sodium: 140 mmol/L (ref 135–145)

## 2020-02-28 LAB — LIPASE, BLOOD: Lipase: 86 U/L — ABNORMAL HIGH (ref 11–51)

## 2020-02-28 LAB — PHOSPHORUS: Phosphorus: 3.9 mg/dL (ref 2.5–4.6)

## 2020-02-28 MED ORDER — SODIUM CHLORIDE 0.9 % IV SOLN: INTRAVENOUS | Status: DC

## 2020-02-28 NOTE — Progress Notes (Signed)
PROGRESS NOTE  Kevin Baxter  DOB: March 06, 1970  PCP: Colon Branch, MD ZOX:[id number]  DOA: 02/26/2020  LOS: 1 day   Chief Complaint  Patient presents with  . Abdominal Pain   Brief narrative: Kevin Baxter is a 50 y.o. male with PMH significant for hypertension and gout. Patient presented to the ED on 1/27 with complaint of abdominal pain, nausea progressively worsening for last 3 weeks.  In the ED, patient was afebrile, tachycardic to 120s, blood pressure stable.  Labs with creatinine elevated 1.33, LFTs normal, lipase elevated to 82, troponin slightly elevated Urinalysis with amber color urine with moderate amount hemoglobin and many bacteria. CT scan of abdomen showed findings suggestive of acute pancreatitis in the area of pancreatic tail Patient was admitted to hospitalist service for acute pancreatitis.   Subjective: Patient was seen and examined this morning. Abdominal discomfort improving.  Nausea improving, no vomiting. Heart rate and blood pressure improving. Lipase level static.  Assessment/Plan: Acute pancreatitis -Unclear etiology.  Denies alcohol use. CT abdomen did not show any evidence of gallstone. Triglyceride level normal.  Recently started herbal supplement. -Currently on conservative management with IV fluid, IV morphine, IV Zofran, n.p.o. status. -Continue IV hydration with normal saline at 150 mill per hour.   -Protonix IV daily. -Start clear liquid diet today.  If patient tolerates, start full liquid diet from tonight. -Suggested to avoid herbal supplement.  Elevated creatinine -Improved with IV fluid. Recent Labs    05/20/19 1342 02/26/20 1000 02/27/20 0034 02/28/20 0620  BUN 14 12 11 13   CREATININE 1.20 1.33* 1.28*  1.21 1.05   Essential hypertension -Home meds include losartan 100 mg daily.  Currently on hold.  Blood pressure remained stable.  Chronic gout -Continue allopurinol 300 mg daily.  Mobility: Encourage  ambulation Code Status:   Code Status: Full Code  Nutritional status: Body mass index is 41.69 kg/m.     Diet Order            Diet clear liquid Room service appropriate? Yes; Fluid consistency: Thin  Diet effective now                 DVT prophylaxis: Lovenox subcu   Antimicrobials:  None Fluid: NS at 150 mill per hour Consultants: None Family Communication:  Spoke with patient's wife on the phone  Status is: Inpatient Remains inpatient appropriate because: Needs continuous IV hydration  Dispo: The patient is from: Home              Anticipated d/c is to: Home              Anticipated d/c date is: 2 days              Patient currently is not medically stable to d/c.   Difficult to place patient No    Infusions:  . sodium chloride 150 mL/hr at 02/28/20 1229    Scheduled Meds: . allopurinol  300 mg Oral Daily  . enoxaparin (LOVENOX) injection  70 mg Subcutaneous Q24H  . pantoprazole (PROTONIX) IV  40 mg Intravenous Q24H    Antimicrobials: Anti-infectives (From admission, onward)   None      PRN meds: ALPRAZolam, alum & mag hydroxide-simeth, hydrALAZINE, morphine injection, ondansetron (ZOFRAN) IV, simethicone   Objective: Vitals:   02/27/20 2126 02/28/20 0426  BP: (!) 136/102 100/84  Pulse: (!) 109 98  Resp:  16  Temp:  97.9 F (36.6 C)  SpO2: 95% 95%    Intake/Output  Summary (Last 24 hours) at 02/28/2020 1334 Last data filed at 02/28/2020 0449 Gross per 24 hour  Intake 2098.8 ml  Output 650 ml  Net 1448.8 ml   Filed Weights   02/26/20 0943  Weight: (!) 143.3 kg   Weight change:  Body mass index is 41.69 kg/m.   Physical Exam: General exam: Morbidly obese middle-aged African-American male.  Pain controlled Skin: No rashes, lesions or ulcers. HEENT: Atraumatic, normocephalic, no obvious bleeding Lungs: Clear to auscultation bilaterally CVS: Regular rate and rhythm, no murmur GI/Abd soft, nontender, nondistended, bowel sound  present CNS: Alert, awake, oriented x3 Psychiatry: Mood appropriate Extremities: No pedal edema, no calf tenderness  Data Review: I have personally reviewed the laboratory data and studies available.  Recent Labs  Lab 02/26/20 1000 02/27/20 0034 02/28/20 0620  WBC 7.1 6.4  6.4 5.5  NEUTROABS  --  4.4 3.4  HGB 18.5* 16.9  16.7 15.7  HCT 57.2* 53.8*  53.1* 49.6  MCV 79.4* 82.0  81.6 81.3  PLT 256 214  214 198   Recent Labs  Lab 02/26/20 1000 02/27/20 0034 02/28/20 0620  NA 138 139 140  K 4.2 4.5 4.2  CL 101 105 102  CO2 26 25 27   GLUCOSE 112* 96 81  BUN 12 11 13   CREATININE 1.33* 1.28*  1.21 1.05  CALCIUM 9.4 9.0 8.7*  MG  --   --  1.8  PHOS  --   --  3.9    F/u labs ordered  Signed, Terrilee Croak, MD [facility] 02/28/2020

## 2020-02-29 LAB — CBC WITH DIFFERENTIAL/PLATELET
Abs Immature Granulocytes: 0.01 10*3/uL (ref 0.00–0.07)
Basophils Absolute: 0 10*3/uL (ref 0.0–0.1)
Basophils Relative: 0 %
Eosinophils Absolute: 0.2 10*3/uL (ref 0.0–0.5)
Eosinophils Relative: 5 %
HCT: 51.7 % (ref 39.0–52.0)
Hemoglobin: 16.1 g/dL (ref 13.0–17.0)
Immature Granulocytes: 0 %
Lymphocytes Relative: 23 %
Lymphs Abs: 1.1 10*3/uL (ref 0.7–4.0)
MCH: 25.4 pg — ABNORMAL LOW (ref 26.0–34.0)
MCHC: 31.1 g/dL (ref 30.0–36.0)
MCV: 81.4 fL (ref 80.0–100.0)
Monocytes Absolute: 0.6 10*3/uL (ref 0.1–1.0)
Monocytes Relative: 12 %
Neutro Abs: 3 10*3/uL (ref 1.7–7.7)
Neutrophils Relative %: 60 %
Platelets: 210 10*3/uL (ref 150–400)
RBC: 6.35 MIL/uL — ABNORMAL HIGH (ref 4.22–5.81)
RDW: 15.6 % — ABNORMAL HIGH (ref 11.5–15.5)
WBC: 5 10*3/uL (ref 4.0–10.5)
nRBC: 0 % (ref 0.0–0.2)

## 2020-02-29 LAB — BASIC METABOLIC PANEL
Anion gap: 12 (ref 5–15)
BUN: 10 mg/dL (ref 6–20)
CO2: 24 mmol/L (ref 22–32)
Calcium: 8.7 mg/dL — ABNORMAL LOW (ref 8.9–10.3)
Chloride: 101 mmol/L (ref 98–111)
Creatinine, Ser: 1.13 mg/dL (ref 0.61–1.24)
GFR, Estimated: >60 mL/min (ref >60)
Glucose, Bld: 84 mg/dL (ref 70–99)
Potassium: 3.9 mmol/L (ref 3.5–5.1)
Sodium: 137 mmol/L (ref 135–145)

## 2020-02-29 LAB — LIPASE, BLOOD: Lipase: 76 U/L — ABNORMAL HIGH (ref 11–51)

## 2020-02-29 NOTE — Progress Notes (Signed)
Pt verbalized understanding of dc instructions through teach back. Understands to resume home meds and follow up care. No changes in assessment. Tolerated lunch with no c/o bloating or gas. Discharged via wc to front entrance accompanied by NT and wife.

## 2020-02-29 NOTE — Progress Notes (Signed)
Pt called out saying he was short of breath. Pt's vitals were stable. Pt stated " maybe I'm having an anxiety attack. I gave pt xanax. Pt felt relief after taking it.

## 2020-02-29 NOTE — Plan of Care (Signed)
  Problem: Education: Goal: Knowledge of General Education information will improve Description: Including pain rating scale, medication(s)/side effects and non-pharmacologic comfort measures Outcome: Adequate for Discharge   Problem: Health Behavior/Discharge Planning: Goal: Ability to manage health-related needs will improve Outcome: Adequate for Discharge   Problem: Clinical Measurements: Goal: Ability to maintain clinical measurements within normal limits will improve Outcome: Adequate for Discharge Goal: Will remain free from infection Outcome: Adequate for Discharge Goal: Diagnostic test results will improve Outcome: Adequate for Discharge Goal: Cardiovascular complication will be avoided Outcome: Adequate for Discharge   Problem: Nutrition: Goal: Adequate nutrition will be maintained Outcome: Adequate for Discharge   Problem: Pain Managment: Goal: General experience of comfort will improve Outcome: Adequate for Discharge

## 2020-02-29 NOTE — Discharge Summary (Signed)
Physician Discharge Summary  Kevin Baxter [id number] DOB: 08/22/1970 DOA: 02/26/2020  PCP: Colon Branch, MD  Admit date: 02/26/2020 Discharge date: 02/29/2020  Admitted From: Home Discharge disposition: Home   Code Status: Full Code  Diet Recommendation: Soft diet for next 2 to 3 days and then advance to regular diet.  Discharge Diagnosis:   Principal Problem:   Pancreatitis, acute Active Problems:   Gout   Essential hypertension   Acute pancreatitis  Chief Complaint  Patient presents with  . Abdominal Pain   Brief narrative: Kevin Baxter is a 50 y.o. male with PMH significant for hypertension and gout. Patient presented to the ED on 1/27 with complaint of abdominal pain, nausea progressively worsening for last 3 weeks.  In the ED, patient was afebrile, tachycardic to 120s, blood pressure stable.  Labs with creatinine elevated 1.33, LFTs normal, lipase elevated to 82, troponin slightly elevated Urinalysis with amber color urine with moderate amount hemoglobin and many bacteria. CT scan of abdomen showed findings suggestive of acute pancreatitis in the area of pancreatic tail Patient was admitted to hospitalist service for acute pancreatitis.   Subjective: Patient was seen and examined this morning. Abdominal symptoms improved.  Able to tolerate full liquid diet.  Soft diet tried this morning. Lipase level improving.  Assessment/Plan: Acute pancreatitis -Unclear etiology.  Denies alcohol use. CT abdomen did not show any evidence of gallstone. Triglyceride level normal.  Recently started herbal supplement for weight loss. -Treated with conservative management with IV fluid, IV morphine, IV Zofran, n.p.o. status. -Gradually start on clear liquid diet and advanced.  Able to tolerate soft diet.  Okay to discharge home.   -Continue soft diet for next 2 to 3 days and then advance to regular diet. -Not sure if the herbal supplements that he recently started could  have led to pancreatitis.  At this time, I would recommend him to stop them.  Elevated creatinine -Improved with IV fluid. Recent Labs    05/20/19 1342 02/26/20 1000 02/27/20 0034 02/28/20 0620 02/29/20 0544  BUN 14 12 11 13 10   CREATININE 1.20 1.33* 1.28*  1.21 1.05 1.13   Essential hypertension -Home meds include losartan 100 mg daily.  Okay to resume post discharge.  Chronic gout -Continue allopurinol 300 mg daily.  GERD -Continue PPI and Mylanta at home as before.  Morbid obesity - Body mass index is 41.69 kg/m. Patient has been advised to make an attempt to improve diet and exercise patterns to aid in weight loss.  Stable for discharge home today.  Wound care:    Discharge Exam:   Vitals:   02/28/20 1420 02/28/20 2238 02/29/20 0523 02/29/20 0540  BP: 133/89 139/90 (!) 140/101 (!) 127/100  Pulse: (!) 52 (!) 49 94 (!) 106  Resp: 17 18  17   Temp:  98.2 F (36.8 C) 98.2 F (36.8 C) 98 F (36.7 C)  TempSrc:  Oral Oral Oral  SpO2: 99% 97% 96% 97%  Weight:      Height:        Body mass index is 41.69 kg/m.  General exam: Pleasant, middle-aged African-American male.  Morbidly obese.  Not in distress Skin: No rashes, lesions or ulcers. HEENT: Atraumatic, normocephalic, no obvious bleeding Lungs: Clear to auscultation bilaterally CVS: Regular rate and rhythm, no murmur GI/Abd soft, nontender, distended from obesity, bowel sound present CNS: Alert, awake, oriented x3 Psychiatry: Mood appropriate Extremities: No pedal edema, no calf tenderness  Follow ups:   Discharge Instructions  Diet general   Complete by: As directed    Soft diet for next few days then advance to regular consistency cardiac diet.   Increase activity slowly   Complete by: As directed       Follow-up Information    [name], MD Follow up.   Specialty: Internal Medicine Contact information: [address] [place] [address] [phone]        Quintella Reichert, MD .   Specialty: Cardiology Contact information: [address] [place] [address] [phone]               Recommendations for Outpatient Follow-Up:   1. Follow-up with PCP as an outpatient  Discharge Instructions:  Follow with Primary MD [name], MD in 7 days   Get CBC/BMP checked in next visit within 1 week by PCP or SNF MD ( we routinely change or add medications that can affect your baseline labs and fluid status, therefore we recommend that you get the mentioned basic workup next visit with your PCP, your PCP may decide not to get them or add new tests based on their clinical decision)  On your next visit with your PCP, please Get Medicines reviewed and adjusted.  Please request your PCP  to go over all Hospital Tests and Procedure/Radiological results at the follow up, please get all Hospital records sent to your Prim MD by signing hospital release before you go home.  Activity: As tolerated with Full fall precautions use walker/cane & assistance as needed  For Heart failure patients - Check your Weight same time everyday, if you gain over 2 pounds, or you develop in leg swelling, experience more shortness of breath or chest pain, call your Primary MD immediately. Follow Cardiac Low Salt Diet and 1.5 lit/day fluid restriction.  If you have smoked or chewed Tobacco in the last 2 yrs please stop smoking, stop any regular Alcohol  and or any Recreational drug use.  If you experience worsening of your admission symptoms, develop shortness of breath, life threatening emergency, suicidal or homicidal thoughts you must seek medical attention immediately by calling 911 or calling your MD immediately  if symptoms less severe.  You Must read complete instructions/literature along with all the possible adverse reactions/side effects for all the Medicines you take and that have been prescribed to you. Take any new Medicines after you have completely  understood and accpet all the possible adverse reactions/side effects.   Do not drive, operate heavy machinery, perform activities at heights, swimming or participation in water activities or provide baby sitting services if your were admitted for syncope or siezures until you have seen by Primary MD or a Neurologist and advised to do so again.  Do not drive when taking Pain medications.  Do not take more than prescribed Pain, Sleep and Anxiety Medications  Wear Seat belts while driving.   Please note You were cared for by a hospitalist during your hospital stay. If you have any questions about your discharge medications or the care you received while you were in the hospital after you are discharged, you can call the unit and asked to speak with the hospitalist on call if the hospitalist that took care of you is not available. Once you are discharged, your primary care physician will handle any further medical issues. Please note that NO REFILLS for any discharge medications will be authorized once you are discharged, as it is imperative that  you return to your primary care physician (or establish a relationship with a primary care physician if you do not have one) for your aftercare needs so that they can reassess your need for medications and monitor your lab values.    Allergies as of 02/29/2020      Reactions   Pseudoephedrine    REACTION: jittery      Medication List    TAKE these medications   allopurinol 300 MG tablet Commonly known as: ZYLOPRIM Take 1 tablet (300 mg total) by mouth daily.   colchicine 0.6 MG tablet Take 1 tablet (0.6 mg total) by mouth 2 (two) times daily as needed (gout flare).   dicyclomine 20 MG tablet Commonly known as: BENTYL Take 1 tablet (20 mg total) by mouth every 6 (six) hours as needed for spasms.   esomeprazole 40 MG capsule Commonly known as: NEXIUM Take 1 capsule (40 mg total) by mouth daily before breakfast.   losartan 100 MG  tablet Commonly known as: COZAAR Take 1 tablet (100 mg total) by mouth daily.   tadalafil 20 MG tablet Commonly known as: Cialis Take 1 tablet (20 mg total) by mouth daily as needed for erectile dysfunction.       Time coordinating discharge: 35 minutes  The results of significant diagnostics from this hospitalization (including imaging, microbiology, ancillary and laboratory) are listed below for reference.    Procedures and Diagnostic Studies:   CT Angio Chest PE W and/or Wo Contrast  Result Date: 02/26/2020 CLINICAL DATA:  Abdominal pain for 3 weeks EXAM: CT ANGIOGRAPHY CHEST CT ABDOMEN AND PELVIS WITH CONTRAST TECHNIQUE: Multidetector CT imaging of the chest was performed using the standard protocol during bolus administration of intravenous contrast. Multiplanar CT image reconstructions and MIPs were obtained to evaluate the vascular anatomy. Multidetector CT imaging of the abdomen and pelvis was performed using the standard protocol during bolus administration of intravenous contrast. CONTRAST:  121mL OMNIPAQUE IOHEXOL 350 MG/ML SOLN COMPARISON:  None. FINDINGS: CTA CHEST FINDINGS Cardiovascular: Satisfactory opacification of the pulmonary arteries to the segmental level. No evidence of pulmonary embolism. Mildly enlarged heart size. No pericardial effusion. Mediastinum/Nodes: Thyroid is unremarkable. No axillary or mediastinal adenopathy. Lungs/Pleura: No pleural effusion or pneumothorax. Subsegmental RIGHT lower lobe atelectasis. Musculoskeletal: No chest wall abnormality. No acute or significant osseous findings. Review of the MIP images confirms the above findings. CT ABDOMEN and PELVIS FINDINGS Hepatobiliary: Focal fatty deposition adjacent to the falciform ligament. Gallbladder is unremarkable. No intrahepatic or extrahepatic biliary ductal dilation. Portal vein is patent. Pancreas: There is expansion of the pancreatic tail with adjacent fat stranding and interdigitating edema. No  evidence of pseudoaneurysm formation or significant adjacent organized fluid collection. The pancreas demonstrates relatively homogeneous enhancement. Spleen: Spleen is unremarkable. Adrenals/Urinary Tract: Adrenal glands are unremarkable. No hydronephrosis. RIGHT renal cyst. Additional subcentimeter hypodense lesions are too small to accurately characterize. Bladder is decompressed. Stomach/Bowel: Stomach is within normal limits. Appendix appears normal. No evidence of bowel wall thickening, distention, or inflammatory changes. Vascular/Lymphatic: No significant vascular findings are present. No enlarged abdominal or pelvic lymph nodes. Reproductive: Prostate is present. Other: No free air.  Fat containing RIGHT inguinal hernia. Musculoskeletal: Degenerative changes of the lower lumbar spine with mild retrolisthesis of L5-S1. Review of the MIP images confirms the above findings. IMPRESSION: 1. Constellation of findings are consistent with acute interstitial pancreatitis. Recommend correlation with lipase levels. 2. No evidence of acute pulmonary embolism. No acute intrathoracic process. Electronically Signed   By: Valentino Saxon MD  On: 02/26/2020 11:58   CT ABDOMEN PELVIS W CONTRAST  Result Date: 02/26/2020 CLINICAL DATA:  Abdominal pain for 3 weeks EXAM: CT ANGIOGRAPHY CHEST CT ABDOMEN AND PELVIS WITH CONTRAST TECHNIQUE: Multidetector CT imaging of the chest was performed using the standard protocol during bolus administration of intravenous contrast. Multiplanar CT image reconstructions and MIPs were obtained to evaluate the vascular anatomy. Multidetector CT imaging of the abdomen and pelvis was performed using the standard protocol during bolus administration of intravenous contrast. CONTRAST:  129mL OMNIPAQUE IOHEXOL 350 MG/ML SOLN COMPARISON:  None. FINDINGS: CTA CHEST FINDINGS Cardiovascular: Satisfactory opacification of the pulmonary arteries to the segmental level. No evidence of pulmonary  embolism. Mildly enlarged heart size. No pericardial effusion. Mediastinum/Nodes: Thyroid is unremarkable. No axillary or mediastinal adenopathy. Lungs/Pleura: No pleural effusion or pneumothorax. Subsegmental RIGHT lower lobe atelectasis. Musculoskeletal: No chest wall abnormality. No acute or significant osseous findings. Review of the MIP images confirms the above findings. CT ABDOMEN and PELVIS FINDINGS Hepatobiliary: Focal fatty deposition adjacent to the falciform ligament. Gallbladder is unremarkable. No intrahepatic or extrahepatic biliary ductal dilation. Portal vein is patent. Pancreas: There is expansion of the pancreatic tail with adjacent fat stranding and interdigitating edema. No evidence of pseudoaneurysm formation or significant adjacent organized fluid collection. The pancreas demonstrates relatively homogeneous enhancement. Spleen: Spleen is unremarkable. Adrenals/Urinary Tract: Adrenal glands are unremarkable. No hydronephrosis. RIGHT renal cyst. Additional subcentimeter hypodense lesions are too small to accurately characterize. Bladder is decompressed. Stomach/Bowel: Stomach is within normal limits. Appendix appears normal. No evidence of bowel wall thickening, distention, or inflammatory changes. Vascular/Lymphatic: No significant vascular findings are present. No enlarged abdominal or pelvic lymph nodes. Reproductive: Prostate is present. Other: No free air.  Fat containing RIGHT inguinal hernia. Musculoskeletal: Degenerative changes of the lower lumbar spine with mild retrolisthesis of L5-S1. Review of the MIP images confirms the above findings. IMPRESSION: 1. Constellation of findings are consistent with acute interstitial pancreatitis. Recommend correlation with lipase levels. 2. No evidence of acute pulmonary embolism. No acute intrathoracic process. Electronically Signed   By: Valentino Saxon MD   On: 02/26/2020 11:58     Labs:   Basic Metabolic Panel: Recent Labs  Lab  02/26/20 1000 02/27/20 0034 02/28/20 0620 02/29/20 0544  NA 138 139 140 137  K 4.2 4.5 4.2 3.9  CL 101 105 102 101  CO2 26 25 27 24   GLUCOSE 112* 96 81 84  BUN 12 11 13 10   CREATININE 1.33* 1.28*  1.21 1.05 1.13  CALCIUM 9.4 9.0 8.7* 8.7*  MG  --   --  1.8  --   PHOS  --   --  3.9  --    GFR Estimated Creatinine Clearance: 115.2 mL/min (by C-G formula based on SCr of 1.13 mg/dL). Liver Function Tests: Recent Labs  Lab 02/26/20 1000 02/27/20 0034  AST 16 12*  ALT 16 15  ALKPHOS 62 49  BILITOT 2.1* 1.7*  PROT 8.5* 6.7  ALBUMIN 4.2 3.3*   Recent Labs  Lab 02/26/20 1000 02/28/20 0620 02/29/20 0544  LIPASE 82* 86* 76*   No results for input(s): AMMONIA in the last 168 hours. Coagulation profile No results for input(s): INR, PROTIME in the last 168 hours.  CBC: Recent Labs  Lab 02/26/20 1000 02/27/20 0034 02/28/20 0620 02/29/20 0544  WBC 7.1 6.4  6.4 5.5 5.0  NEUTROABS  --  4.4 3.4 3.0  HGB 18.5* 16.9  16.7 15.7 16.1  HCT 57.2* 53.8*  53.1* 49.6 51.7  MCV  79.4* 82.0  81.6 81.3 81.4  PLT 256 214  214 198 210   Cardiac Enzymes: No results for input(s): CKTOTAL, CKMB, CKMBINDEX, TROPONINI in the last 168 hours. BNP: Invalid input(s): POCBNP CBG: No results for input(s): GLUCAP in the last 168 hours. D-Dimer No results for input(s): DDIMER in the last 72 hours. Hgb A1c No results for input(s): HGBA1C in the last 72 hours. Lipid Profile Recent Labs    02/27/20 0034  TRIG 60   Thyroid function studies No results for input(s): TSH, T4TOTAL, T3FREE, THYROIDAB in the last 72 hours.  Invalid input(s): FREET3 Anemia work up No results for input(s): VITAMINB12, FOLATE, FERRITIN, TIBC, IRON, RETICCTPCT in the last 72 hours. Microbiology Recent Results (from the past 240 hour(s))  SARS Coronavirus 2 by RT PCR (hospital order, performed in [facility] hospital lab) Nasopharyngeal Nasopharyngeal Swab     Status: None   Collection Time: 02/26/20 12:44 PM    Specimen: Nasopharyngeal Swab  Result Value Ref Range Status   SARS Coronavirus 2 NEGATIVE NEGATIVE Final    Comment: (NOTE) SARS-CoV-2 target nucleic acids are NOT DETECTED.  The SARS-CoV-2 RNA is generally detectable in upper and lower respiratory specimens during the acute phase of infection. The lowest concentration of SARS-CoV-2 viral copies this assay can detect is 250 copies / mL. A negative result does not preclude SARS-CoV-2 infection and should not be used as the sole basis for treatment or other patient management decisions.  A negative result may occur with improper specimen collection / handling, submission of specimen other than nasopharyngeal swab, presence of viral mutation(s) within the areas targeted by this assay, and inadequate number of viral copies (<250 copies / mL). A negative result must be combined with clinical observations, patient history, and epidemiological information.  Fact Sheet for Patients:   StrictlyIdeas.no  Fact Sheet for Healthcare Providers: BankingDealers.co.za  This test is not yet approved or  cleared by the [place] FDA and has been authorized for detection and/or diagnosis of SARS-CoV-2 by FDA under an Emergency Use Authorization (EUA).  This EUA will remain in effect (meaning this test can be used) for the duration of the COVID-19 declaration under Section 564(b)(1) of the Act, 21 U.S.C. section 360bbb-3(b)(1), unless the authorization is terminated or revoked sooner.  Performed at [facility], [address] [place], [place] [address]      Signed: Terrilee Croak  [facility] 02/29/2020, 10:31 AM

## 2020-03-01 ENCOUNTER — Telehealth: Payer: Self-pay

## 2020-03-01 NOTE — Telephone Encounter (Signed)
Transition Care Management Follow-up Telephone Call  Date of discharge and from where: 02/29/20-[place]  How have you been since you were released from the hospital? Doing OK. Patient states he was not given his B/P med while he was in the hospital but started back on it when he got home yesterday.  Any questions or concerns? No  Items Reviewed:  Did the pt receive and understand the discharge instructions provided? Yes   Medications obtained and verified? Yes   Other? Yes   Any new allergies since your discharge? No   Dietary orders reviewed? Yes  Do you have support at home? Yes   Home Care and Equipment/Supplies: Were home health services ordered? no If so, what is the name of the agency? n/a  Has the agency set up a time to come to the patient's home? not applicable Were any new equipment or medical supplies ordered?  No What is the name of the medical supply agency? n/a Were you able to get the supplies/equipment? not applicable Do you have any questions related to the use of the equipment or supplies? n/a  Functional Questionnaire: (I = Independent and D = Dependent) ADLs: I  Bathing/Dressing- I  Meal Prep- I  Eating- I  Maintaining continence- I  Transferring/Ambulation- I  Managing Meds- I  Follow up appointments reviewed:   PCP Hospital f/u appt confirmed? Yes  Scheduled to see Dr. Larose Kells on 03/02/20 @ 9:20.  [place] [facility] f/u appt confirmed? No  Patient awaiting call from cardiology  Are transportation arrangements needed? No   If their condition worsens, is the pt aware to call PCP or go to the Emergency Dept.? Yes  Was the patient provided with contact information for the PCP's office or ED? Yes  Was to pt encouraged to call back with questions or concerns? Yes

## 2020-03-01 NOTE — Telephone Encounter (Signed)
Have not been able to get a hold of this patient with recommendations, he was admitted to the hospital this weekend

## 2020-03-01 NOTE — Telephone Encounter (Signed)
Noted, thank you.   TCM call completed. Patient has an appt on 03/02/20 @ 9:20. This was originally an appt to check B/P med but patient does not want to have an additional appt within the next 14 days so appt switched to a hosp f/u.

## 2020-03-02 ENCOUNTER — Other Ambulatory Visit: Payer: Self-pay

## 2020-03-02 ENCOUNTER — Encounter: Payer: Self-pay | Admitting: Internal Medicine

## 2020-03-02 ENCOUNTER — Ambulatory Visit: Payer: 59 | Admitting: Internal Medicine

## 2020-03-02 VITALS — BP 136/90 | HR 117 | Temp 98.0°F | Resp 18 | Ht 73.0 in | Wt 317.4 lb

## 2020-03-02 DIAGNOSIS — R000 Tachycardia, unspecified: Secondary | ICD-10-CM | POA: Diagnosis not present

## 2020-03-02 DIAGNOSIS — F321 Major depressive disorder, single episode, moderate: Secondary | ICD-10-CM

## 2020-03-02 DIAGNOSIS — I10 Essential (primary) hypertension: Secondary | ICD-10-CM

## 2020-03-02 DIAGNOSIS — K859 Acute pancreatitis without necrosis or infection, unspecified: Secondary | ICD-10-CM

## 2020-03-02 MED ORDER — BUPROPION HCL 100 MG PO TABS: ORAL_TABLET | ORAL | 1 refills | Status: DC

## 2020-03-02 NOTE — Patient Instructions (Addendum)
Check the  blood pressure 2 or 3 times a week BP GOAL is between 110/65 and  135/85. If it is consistently higher or lower, let me know  [place], [place] back for   blood work only in 10 days  Come back for a checkup with me in 4 weeks

## 2020-03-02 NOTE — Progress Notes (Signed)
Pre visit review using our clinic review tool, if applicable. No additional management support is needed unless otherwise documented below in the visit note. 

## 2020-03-02 NOTE — Progress Notes (Addendum)
Subjective:    Patient ID: Kevin Baxter, male    DOB: 08-22-1970, 50 y.o.   MRN: [id number]  DOS:  03/02/2020 Type of visit - description: TCM, last visit with me 05-2019  Admitted to hospital and discharged 02/29/2020: Presented with abdominal pain, nausea for 3 weeks. He was found to be afebrile, tachycardic. CT show findings suggestive of acute pancreatitis. No gallstones.  No elevated TG.  Did start a herbal  supplement for weight loss PTA. Discharged in improved condition.  Since he left the hospital, GI symptoms resolved. No fever chills No nausea vomiting No blood in the stools No abdominal pain.  Also, patient feels he is depressed: They pandemia has been very hard on him, he works from home and so does his wife.  He has "no outlet". Denies suicidal ideas. Relationship with wife is okay, children are doing well..  Review of Systems See above   Past Medical History:  Diagnosis Date  . Allergy   . Arthritis   . Fundic gland polyposis of stomach   . GERD with esophagitis   . Gout   . Hx of adenomatous colonic polyps   . Hypertension     Past Surgical History:  Procedure Laterality Date  . COLONOSCOPY  05/2019  . ESOPHAGOGASTRODUODENOSCOPY  05/2019  . HAND SURGERY Right    2006 for a FX    Allergies as of 03/02/2020      Reactions   Pseudoephedrine    REACTION: jittery      Medication List       Accurate as of March 02, 2020  6:45 PM. If you have any questions, ask your nurse or doctor.        allopurinol 300 MG tablet Commonly known as: ZYLOPRIM Take 1 tablet (300 mg total) by mouth daily.   buPROPion 100 MG tablet Commonly known as: WELLBUTRIN Take 1 tablet a day x 1 week, then 1 tab BID Started by: Kathlene November, MD   colchicine 0.6 MG tablet Take 1 tablet (0.6 mg total) by mouth 2 (two) times daily as needed (gout flare).   dicyclomine 20 MG tablet Commonly known as: BENTYL Take 1 tablet (20 mg total) by mouth every 6 (six) hours as  needed for spasms.   esomeprazole 40 MG capsule Commonly known as: NEXIUM Take 1 capsule (40 mg total) by mouth daily before breakfast.   losartan 100 MG tablet Commonly known as: COZAAR Take 1 tablet (100 mg total) by mouth daily.   tadalafil 20 MG tablet Commonly known as: Cialis Take 1 tablet (20 mg total) by mouth daily as needed for erectile dysfunction.          Objective:   Physical Exam Musculoskeletal:       Arms:    BP 136/90 (BP Location: Left Arm, Patient Position: Sitting, Cuff Size: Normal)   Pulse (!) 117   Temp 98 F (36.7 C) (Oral)   Resp 18   Ht 6\' 1"  (1.854 m)   Wt (!) 317 lb 6 oz (144 kg)   SpO2 96%   BMI 41.87 kg/m  General:   Well developed, NAD, BMI noted.  HEENT:  Normocephalic . Face symmetric, atraumatic Lungs:  CTA B Normal respiratory effort, no intercostal retractions, no accessory muscle use. Heart: RRR,  no murmur.  Abdomen:  Not distended, soft, non-tender. No rebound or rigidity.  Has a ecchymosis at the left side of the abdomen, nontender, last where he got subcu Lovenox at the  hospital. Skin: Not pale. Not jaundice Lower extremities: no pretibial edema bilaterally  Neurologic:  alert & oriented X3.  Speech normal, gait appropriate for age and unassisted Psych--  Cognition and judgment appear intact.  Cooperative with normal attention span and concentration.  Behavior appropriate. No anxious or depressed appearing.     Assessment     Assessment: Prediabetes since 2004, a1c 6.2  HTN Gout Morbid obesity  ED Asthma as a child  PLAN: Acute pancreatitis: Resolving, recheck labs in 10 days, will need a CMP, CBC, amylase, lipase. No history of EtOH, no increase triglycerides,Etiology?  Related to new herbal supplements? Clinically improving.  Reassess on RTC, consider GI referral.  No more herbal supplements. Phlebitis: See physical exam, improving since he left the hospital, recommend a warm compress and call if not  gradually better.  May need antibiotics. OSA: saw cards virtually 05/2019, rx Sleep study, did not pursue, strongly recommend to do HTN, tachycardia: At the hospital the BP was somewhat elevated and he was also tachycardic.  CT chest showed no PE. EKG today: Sinus tachycardia, no acute changes compared to previous EKG. Continue with losartan, reassess on RTC.  Check TSH, beta-blockers?. Depression: See HPI, first time he mentioned this problem, sxs are severe, PHQ-9 23, no suicidal ideas, apparently related to stress, pandemia, etc. Listening therapy provided, recommend counseling.  Also SSRIs/wellbutrin discussed.  Elected Wellbutrin 100 mg: 1 a day, then  1 p.o. twice daily. Treatment of OSA like to  significantly  decrease   symptoms as well. [place], flu shot: Declined RTC 4 weeks 40 min  This visit occurred during the SARS-CoV-2 public health emergency.  Safety protocols were in place, including screening questions prior to the visit, additional usage of staff PPE, and extensive cleaning of exam room while observing appropriate contact time as indicated for disinfecting solutions.

## 2020-03-02 NOTE — Assessment & Plan Note (Addendum)
Acute pancreatitis: Resolving, recheck labs in 10 days, will need a CMP, CBC, amylase, lipase. No history of EtOH, no increase triglycerides,Etiology?  Related to new herbal supplements? Clinically improving.  Reassess on RTC, consider GI referral.  No more herbal supplements. Phlebitis: See physical exam, improving since he left the hospital, recommend a warm compress and call if not gradually better.  May need antibiotics. OSA: saw cards virtually 05/2019, rx Sleep study, did not pursue, strongly recommend to do HTN, tachycardia: At the hospital the BP was somewhat elevated and he was also tachycardic.  CT chest showed no PE. EKG today: Sinus tachycardia, no acute changes compared to previous EKG. Continue with losartan, reassess on RTC.  Check TSH, beta-blockers?. Depression: See HPI, first time he mentioned this problem, sxs are severe, PHQ-9 23, no suicidal ideas, apparently related to stress, pandemia, etc. Listening therapy provided, recommend counseling.  Also SSRIs/wellbutrin discussed.  Elected Wellbutrin 100 mg: 1 a day, then  1 p.o. twice daily. Treatment of OSA like to  significantly  decrease   symptoms as well. [place], flu shot: Declined RTC 4 weeks 40 min

## 2020-03-22 ENCOUNTER — Other Ambulatory Visit: Payer: Self-pay

## 2020-03-22 ENCOUNTER — Encounter: Payer: Self-pay | Admitting: Family Medicine

## 2020-03-22 ENCOUNTER — Ambulatory Visit (INDEPENDENT_AMBULATORY_CARE_PROVIDER_SITE_OTHER): Payer: 59 | Admitting: Family Medicine

## 2020-03-22 VITALS — BP 132/84 | HR 67 | Temp 97.8°F | Ht 73.0 in | Wt 309.0 lb

## 2020-03-22 DIAGNOSIS — R1319 Other dysphagia: Secondary | ICD-10-CM | POA: Diagnosis not present

## 2020-03-22 DIAGNOSIS — K219 Gastro-esophageal reflux disease without esophagitis: Secondary | ICD-10-CM

## 2020-03-22 MED ORDER — PANTOPRAZOLE SODIUM 40 MG PO TBEC: 40.0000 mg | DELAYED_RELEASE_TABLET | Freq: Every day | ORAL | 3 refills | Status: DC

## 2020-03-22 NOTE — Progress Notes (Signed)
Chief Complaint  Patient presents with  . Gastroesophageal Reflux    Subjective: Patient is a 50 y.o. male here for GI issues.  He is here with his wife.  The patient has been having difficulty swallowing both solids and liquids over the past month.  He has a history of esophagitis without stricture, but the upper endoscopy was done back in May.  No recent insult or ingestion of caustic material.  He is losing weight because he is not eating as often.  He is not regurgitating.  He denies any bleeding or diarrhea.  The patient has a history of reflux.  He is currently taking Nexium 40 mg daily.  He was taking Prilosec in the past.  Has never been on Protonix.  He is having bloating, fullness, the feeling of needing to regurgitate/nausea, dyspepsia, and epigastric abdominal discomfort.  Past Medical History:  Diagnosis Date  . Allergy   . Arthritis   . Fundic gland polyposis of stomach   . GERD with esophagitis   . Gout   . Hx of adenomatous colonic polyps   . Hypertension     Objective: BP 132/84 (BP Location: Left Arm, Patient Position: Sitting, Cuff Size: Large)   Pulse 67   Temp 97.8 F (36.6 C) (Oral)   Ht 6\' 1"  (1.854 m)   Wt (!) 309 lb (140.2 kg)   SpO2 97%   BMI 40.77 kg/m  General: Awake, appears stated age Abdomen: Bowel sounds present, soft, currently nontender, nondistended, no masses or organomegaly Heart: RRR, no murmurs Lungs: CTAB, no rales, wheezes or rhonchi. No accessory muscle use Psych: Age appropriate judgment and insight, normal affect and mood  Assessment and Plan: Esophageal dysphagia  Gastroesophageal reflux disease, unspecified whether esophagitis present - Plan: pantoprazole (PROTONIX) 40 MG tablet  1.  Needs to reach out to GI, currently has an appointment on 3/10.  I think this is appropriate. 2.  Change Nexium to Protonix.  Take twice daily for 5 days.  Could also add famotidine 20 mg twice daily as an adjunct.  Reflux precautions verbalized  and written down. His follow-up will be with the gastroenterology team.  He can see me as needed. The patient and his spouse voiced understanding and agreement to the plan.  [place], DO 03/22/20  4:20 PM

## 2020-03-22 NOTE — Patient Instructions (Addendum)
Take the Protonix twice daily for the first 5 days.  You can take Pepcid (famotidine) 20 mg twice daily with the Protonix and/or Nexium.  The only lifestyle changes that have data behind them are weight loss for the overweight/obese and elevating the head of the bed. Finding out which foods/positions are triggers is important.  Stop chewing gum, drinking carbonated beverages, gulping liquids, and drinking alcohol to help with belching.  These foods may cause you to belch more: Wheat, barley, rye, onion, leek, white part of spring onion, garlic, shallots, artichokes, beetroot, fennel, peas, chicory, pistachio, cashews, legumes, lentils, and chickpeas; Milk, custard, ice cream, and yogurt; Apples, pears, mangoes, cherries, watermelon, asparagus, sugar snap peas, honey, high-fructose corn syrup; Apricots, nectarines, peaches, plums, mushrooms, cauliflower, artificially sweetened chewing gum and confectionery  Call your insurance company to see what erectile dysfunction medications they wil cover. Looking for manufacturer coupons online is also reasonable.   Let us know if you need anything.

## 2020-03-23 ENCOUNTER — Ambulatory Visit: Payer: 59 | Admitting: Internal Medicine

## 2020-03-25 ENCOUNTER — Other Ambulatory Visit: Payer: Self-pay | Admitting: Internal Medicine

## 2020-03-30 ENCOUNTER — Other Ambulatory Visit: Payer: Self-pay

## 2020-03-30 ENCOUNTER — Telehealth: Payer: Self-pay | Admitting: Internal Medicine

## 2020-03-30 ENCOUNTER — Ambulatory Visit: Payer: 59 | Admitting: Internal Medicine

## 2020-03-30 DIAGNOSIS — Z0289 Encounter for other administrative examinations: Secondary | ICD-10-CM

## 2020-03-30 DIAGNOSIS — F321 Major depressive disorder, single episode, moderate: Secondary | ICD-10-CM

## 2020-03-30 DIAGNOSIS — F410 Panic disorder [episodic paroxysmal anxiety] without agoraphobia: Secondary | ICD-10-CM

## 2020-03-30 NOTE — Telephone Encounter (Signed)
FYI. Pt would not reschedule his appt for today. He was late and left very angry that you would not see him. Please advise.

## 2020-03-30 NOTE — Telephone Encounter (Signed)
Pt had an appt today at 11:00, pt came in late 11:15 (stated got caught in traffic) Verified with CMA and per provider pt needed to reschedule, pt got really irritated (very angry) and stated that needed to be seen - pt was offered to reschedule on Thursday since provider did not have anything for today nor tomorrow, (pt was suppose to have a cpe) pt's wife stated pt has not been sleeping well and his frustration was not being managed well. Wife stated will call to reschedule if he decides too.  Pt left very angry.

## 2020-03-30 NOTE — Telephone Encounter (Signed)
Noted, today we just could not accommodate late arrivals

## 2020-03-30 NOTE — Telephone Encounter (Signed)
Advise patient's wife: Will proceed with referral as requested. At the last office visit we had a long conversation about depression, if he has suicidal ideas needs to immediately call for help:  [facility]: [address] [place], [place] [address] [place]: [phone] or [phone]

## 2020-03-30 NOTE — Telephone Encounter (Signed)
Spoke w/ Pt's wife- she informed this is about Pt's depression, she states he has no suicidal ideas. Referral placed to [facility] at [facility]

## 2020-03-30 NOTE — Telephone Encounter (Signed)
Wife calling asking for a referral to [facility] [phone]

## 2020-03-31 ENCOUNTER — Encounter (HOSPITAL_BASED_OUTPATIENT_CLINIC_OR_DEPARTMENT_OTHER): Payer: Self-pay | Admitting: Emergency Medicine

## 2020-03-31 DIAGNOSIS — Z86010 Personal history of colonic polyps: Secondary | ICD-10-CM | POA: Diagnosis not present

## 2020-03-31 DIAGNOSIS — K2100 Gastro-esophageal reflux disease with esophagitis, without bleeding: Secondary | ICD-10-CM | POA: Diagnosis not present

## 2020-03-31 DIAGNOSIS — I10 Essential (primary) hypertension: Secondary | ICD-10-CM | POA: Insufficient documentation

## 2020-03-31 DIAGNOSIS — R1314 Dysphagia, pharyngoesophageal phase: Secondary | ICD-10-CM | POA: Diagnosis not present

## 2020-03-31 DIAGNOSIS — Z79899 Other long term (current) drug therapy: Secondary | ICD-10-CM | POA: Diagnosis not present

## 2020-03-31 DIAGNOSIS — R7401 Elevation of levels of liver transaminase levels: Secondary | ICD-10-CM | POA: Insufficient documentation

## 2020-03-31 NOTE — Telephone Encounter (Signed)
Nurse Assessment Nurse: Cherre Robins, RN, Ria Comment Date/Time (Eastern Time): 03/30/2020 6:40:29 PM Confirm and document reason for call. If symptomatic, describe symptoms. ---Caller states her husband has been having anxiety and she called earlier about getting a referral to a behavior referral bc he needs a referral. States she was asked "is it for depression" and caller answered "yes" and is now wondering if that affects which provider he is referred to. States he is having anxiety and panic. Does the patient have any new or worsening symptoms? ---Yes Will a triage be completed? ---Yes Related visit to physician within the last 2 weeks? ---Yes Does the PT have any chronic conditions? (i.e. diabetes, asthma, this includes High risk factors for pregnancy, etc.) ---Yes List chronic conditions. ---HTN Is this a behavioral health or substance abuse call? ---No Guidelines Guideline Title Affirmed Question Affirmed Notes Nurse Date/Time (Eastern Time) Anxiety and Panic Attack Symptoms interfere with work or school [place], RN, Ria Comment 03/30/2020 6:44:08 PM Disp. Time Eilene Ghazi Time) Disposition Final User 03/30/2020 6:46:35 PM See PCP within 24 Hours Yes Weiss[place], RN, Ria Comment PLEASE NOTE: All timestamps contained within this report are represented as [place] Standard Time. CONFIDENTIALTY NOTICE: This fax transmission is intended only for the addressee. It contains information that is legally privileged, confidential or otherwise protected from use or disclosure. If you are not the intended recipient, you are strictly prohibited from reviewing, disclosing, copying using or disseminating any of this information or taking any action in reliance on or regarding this information. If you have received this fax in error, please notify us immediately by telephone so that we can arrange for its return to [place]. Phone: [phone], Toll-Free: [phone], Fax: [phone] Page: 2 of 2 Call Id:  [id number] [place] Disagree/Comply Comply Caller Understands Yes PreDisposition InappropriateToAsk Care Advice Given Per Guideline SEE PCP WITHIN 24 HOURS: * IF OFFICE WILL BE OPEN: You need to be examined within the next 24 hours. Call your doctor (or NP/PA) when the office opens and make an appointment. HEALTHY LIVING BASICS: * Drink adequate fluids: Drink 6 to 8 cups (1,500 to 2,000 ml) of water each day. * Eat healthy: Eat a well-balanced diet. AVOID TRIGGERS OF ANXIETY: * Exercise regularly: Take a daily walk. Regular exercise will improve your overall health, improve your mood, and is a simple method to reduce stress. * Talk with Friends and Family: Share how you are feeling with someone. Make sure that your spouse, family, or friends know how you are feeling. CALL BACK IF: * You become worse * You feel like harming yourself CARE ADVICE given per Anxiety and Panic Attack (Adult) guideline. Referrals REFERRED TO PCP OFFICE

## 2020-03-31 NOTE — Telephone Encounter (Signed)
LMOM for Pt's wife Vincente Liberty- informed I updated referral for panic attacks as well but wouldn't think this matters as to what provider he will see. Informed to let us know if they don't receive a call in the next week to schedule an appt.

## 2020-03-31 NOTE — ED Triage Notes (Signed)
Pt c/o epigastric pain/ chest pain. Pt states that the pain sits there and that he feels like he needs to throw up but is unable to and feels like something is stuck there x 2 days. Pt has appt with GI on 03/10 but is unable to tolerate this feeling until then. Pt aaox3, ambulatory with steady gait, VSS, GCS 15, NAD noted

## 2020-04-01 ENCOUNTER — Emergency Department (HOSPITAL_BASED_OUTPATIENT_CLINIC_OR_DEPARTMENT_OTHER): Payer: 59

## 2020-04-01 ENCOUNTER — Emergency Department (HOSPITAL_BASED_OUTPATIENT_CLINIC_OR_DEPARTMENT_OTHER)
Admission: EM | Admit: 2020-04-01 | Discharge: 2020-04-01 | Disposition: A | Discharge status: Home / Self Care | Payer: 59 | Attending: Emergency Medicine | Admitting: Emergency Medicine

## 2020-04-01 ENCOUNTER — Ambulatory Visit: Payer: 59 | Admitting: Nurse Practitioner

## 2020-04-01 DIAGNOSIS — R1319 Other dysphagia: Secondary | ICD-10-CM

## 2020-04-01 DIAGNOSIS — R7989 Other specified abnormal findings of blood chemistry: Secondary | ICD-10-CM

## 2020-04-01 LAB — COMPREHENSIVE METABOLIC PANEL
ALT: 65 U/L — ABNORMAL HIGH (ref 0–44)
AST: 46 U/L — ABNORMAL HIGH (ref 15–41)
Albumin: 3.6 g/dL (ref 3.5–5.0)
Alkaline Phosphatase: 53 U/L (ref 38–126)
Anion gap: 13 (ref 5–15)
BUN: 16 mg/dL (ref 6–20)
CO2: 24 mmol/L (ref 22–32)
Calcium: 9.2 mg/dL (ref 8.9–10.3)
Chloride: 104 mmol/L (ref 98–111)
Creatinine, Ser: 1.23 mg/dL (ref 0.61–1.24)
GFR, Estimated: >60 mL/min (ref >60)
Glucose, Bld: 94 mg/dL (ref 70–99)
Potassium: 4.2 mmol/L (ref 3.5–5.1)
Sodium: 141 mmol/L (ref 135–145)
Total Bilirubin: 2.7 mg/dL — ABNORMAL HIGH (ref 0.3–1.2)
Total Protein: 7 g/dL (ref 6.5–8.1)

## 2020-04-01 LAB — CBC WITH DIFFERENTIAL/PLATELET
Abs Immature Granulocytes: 0.01 10*3/uL (ref 0.00–0.07)
Basophils Absolute: 0 10*3/uL (ref 0.0–0.1)
Basophils Relative: 1 %
Eosinophils Absolute: 0.1 10*3/uL (ref 0.0–0.5)
Eosinophils Relative: 1 %
HCT: 50.4 % (ref 39.0–52.0)
Hemoglobin: 16.2 g/dL (ref 13.0–17.0)
Immature Granulocytes: 0 %
Lymphocytes Relative: 31 %
Lymphs Abs: 1.5 10*3/uL (ref 0.7–4.0)
MCH: 25.7 pg — ABNORMAL LOW (ref 26.0–34.0)
MCHC: 32.1 g/dL (ref 30.0–36.0)
MCV: 80 fL (ref 80.0–100.0)
Monocytes Absolute: 0.5 10*3/uL (ref 0.1–1.0)
Monocytes Relative: 11 %
Neutro Abs: 2.7 10*3/uL (ref 1.7–7.7)
Neutrophils Relative %: 56 %
Platelets: 188 10*3/uL (ref 150–400)
RBC: 6.3 MIL/uL — ABNORMAL HIGH (ref 4.22–5.81)
RDW: 17.8 % — ABNORMAL HIGH (ref 11.5–15.5)
WBC: 4.9 10*3/uL (ref 4.0–10.5)
nRBC: 0 % (ref 0.0–0.2)

## 2020-04-01 LAB — LIPASE, BLOOD: Lipase: 49 U/L (ref 11–51)

## 2020-04-01 IMAGING — DX DG CHEST 1V PORT
1 series · 1 of 1 positions shown · non-contrast
Comparison: [DATE]

CLINICAL DATA: Epigastric pain.

EXAM:
PORTABLE CHEST 1 VIEW

[chest ap]
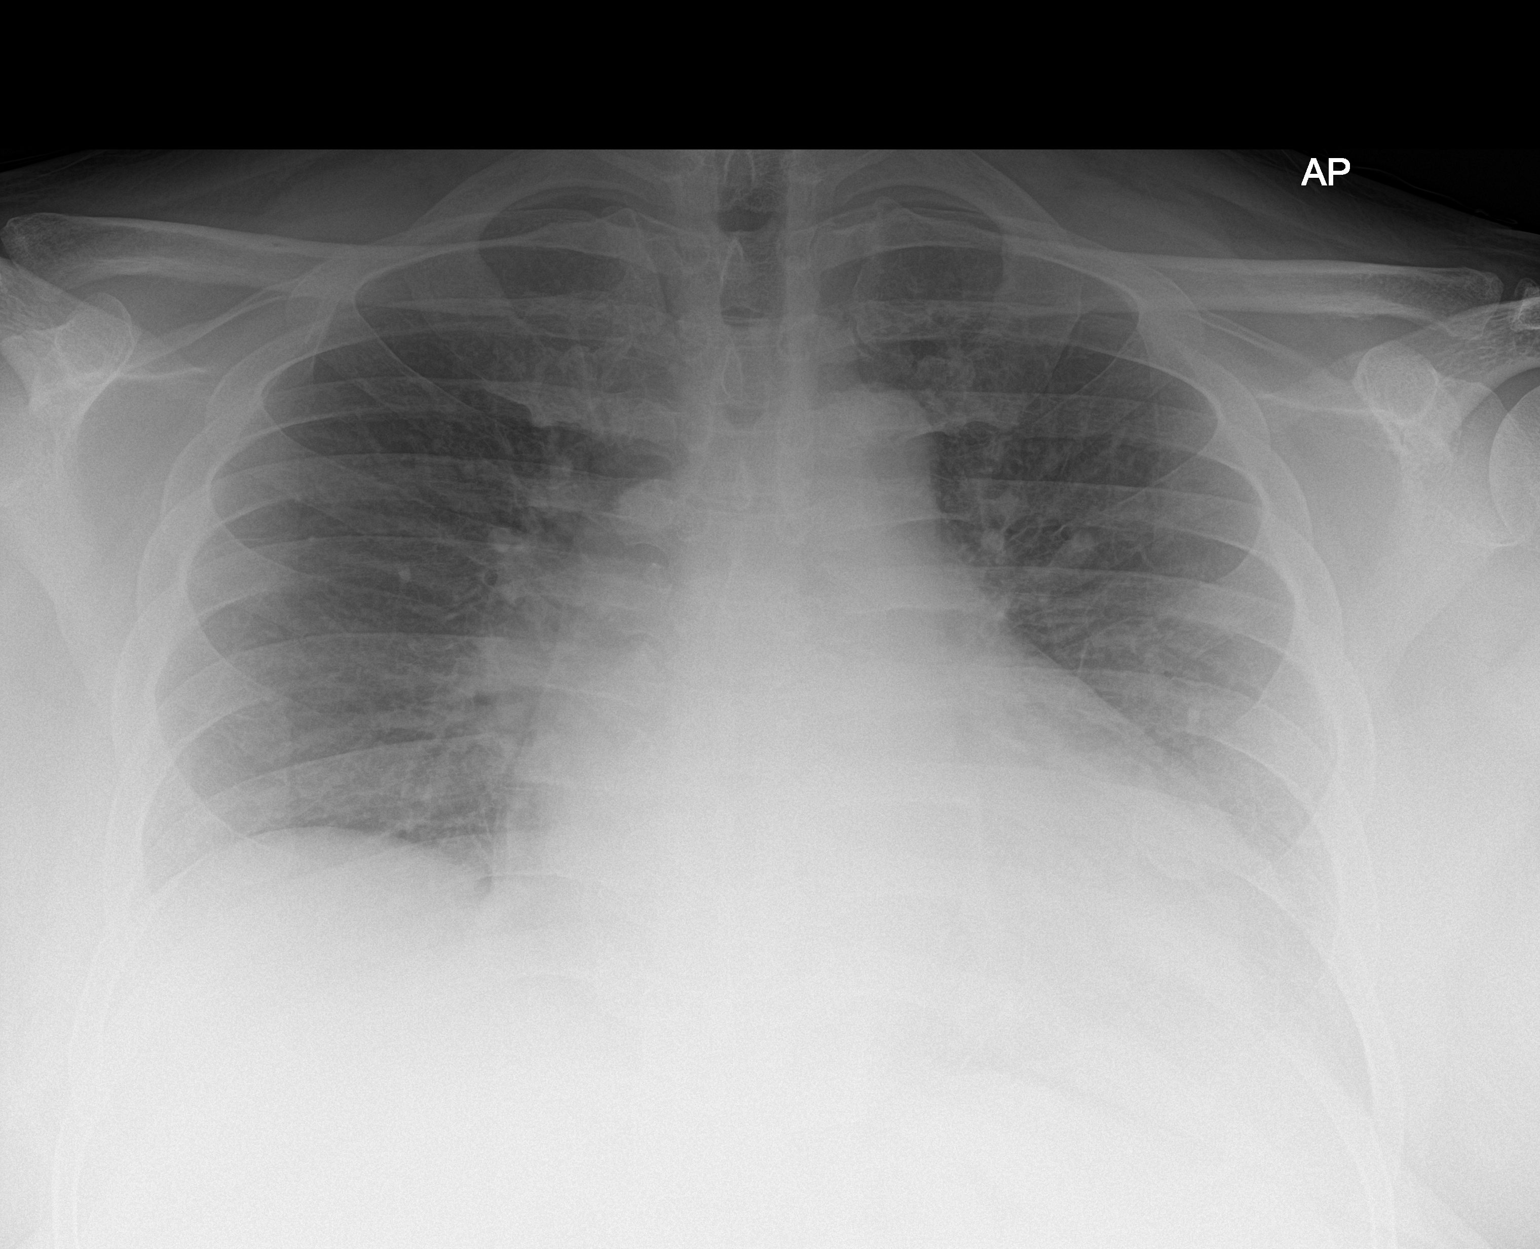

[1 of 1 positions shown; findings below may reference images not displayed]

FINDINGS: The lung volumes are low. There is cardiomegaly with findings of
vascular congestion. No pneumothorax or large pleural effusion. No
acute osseous abnormality. Evaluation is limited by patient body
habitus.
IMPRESSION: Cardiomegaly with findings of vascular congestion.

## 2020-04-01 MED ORDER — LIDOCAINE VISCOUS HCL 2 % MT SOLN
15.0000 mL | Freq: Once | OROMUCOSAL | Status: AC
  Administered 2020-04-01: 15 mL via ORAL
  Filled 2020-04-01: qty 15

## 2020-04-01 MED ORDER — FENTANYL CITRATE (PF) 100 MCG/2ML IJ SOLN
100.0000 ug | Freq: Once | INTRAMUSCULAR | Status: AC
  Administered 2020-04-01: 100 ug via INTRAVENOUS

## 2020-04-01 MED ORDER — ONDANSETRON HCL 4 MG/2ML IJ SOLN
4.0000 mg | Freq: Once | INTRAMUSCULAR | Status: AC
  Administered 2020-04-01: 4 mg via INTRAVENOUS
  Filled 2020-04-01: qty 2

## 2020-04-01 MED ORDER — ALUM & MAG HYDROXIDE-SIMETH 200-200-20 MG/5ML PO SUSP
30.0000 mL | Freq: Once | ORAL | Status: AC
  Administered 2020-04-01: 30 mL via ORAL
  Filled 2020-04-01: qty 30

## 2020-04-01 MED ORDER — SODIUM CHLORIDE 0.9 % IV BOLUS (SEPSIS)
1000.0000 mL | Freq: Once | INTRAVENOUS | Status: AC
  Administered 2020-04-01: 1000 mL via INTRAVENOUS

## 2020-04-01 MED ORDER — FENTANYL CITRATE (PF) 100 MCG/2ML IJ SOLN
100.0000 ug | Freq: Once | INTRAMUSCULAR | Status: DC
  Filled 2020-04-01: qty 2

## 2020-04-01 NOTE — ED Provider Notes (Signed)
[facility] EMERGENCY DEPARTMENT Provider Note   CSN: [id number] Arrival date & time: 03/31/20  2219     History Chief Complaint  Patient presents with  . Abdominal Pain    Kevin Baxter is a 50 y.o. male.  The history is provided by the patient and the spouse.  Patient with history of GERD, hypertension presents with upper chest pain and difficulty swallowing for several weeks.  Patient reports at times he feels the food gets stuck after he swallows and he has nausea and some pain.  No vomiting.  No fevers.  No chest pain or shortness of breath.  He is taking a PPI.  He is scheduled to see his gastroenterologist in 1 week. He reports he had an endoscopy last year and was told he has esophagitis Denies any NSAID abuse.  He has not had any blood in his stool    Past Medical History:  Diagnosis Date  . Allergy   . Arthritis   . Fundic gland polyposis of stomach   . GERD with esophagitis   . Gout   . Hx of adenomatous colonic polyps   . Hypertension     Patient Active Problem List   Diagnosis Date Noted  . Depression, major, single episode, moderate ([place]) 03/02/2020  . Pancreatitis, acute 02/26/2020  . Acute pancreatitis 02/26/2020  . Hx of adenomatous colonic polyps   . Morbid obesity ([place]) 04/22/2015  . PCP NOTES >>>>>>>>>>>>>>>>>>>>>>>>> 04/22/2015  . Hyperglycemia 03/11/2014  . Erectile dysfunction 07/21/2011  . Annual physical exam 01/05/2011  . Gout 03/14/2006  . Essential hypertension 03/14/2006  . ALLERGIC RHINITIS 03/14/2006    Past Surgical History:  Procedure Laterality Date  . COLONOSCOPY  05/2019  . ESOPHAGOGASTRODUODENOSCOPY  05/2019  . HAND SURGERY Right    2006 for a FX       Family History  Problem Relation Age of Onset  . Cancer Mother        CERVICAL  . Hypertension Father   . Heart failure Father        no MI, d/t etoh  . Diabetes Sister   . Diabetes Sister   . Diabetes Paternal Aunt   . Diabetes Cousin   . Colon  cancer Neg Hx   . Prostate cancer Neg Hx   . Stroke Neg Hx   . Esophageal cancer Neg Hx   . Rectal cancer Neg Hx   . Stomach cancer Neg Hx     Social History   Tobacco Use  . Smoking status: Never Smoker  . Smokeless tobacco: Never Used  Vaping Use  . Vaping Use: Never used  Substance Use Topics  . Alcohol use: Yes    Comment: very rare   . Drug use: No    Home Medications Prior to Admission medications   Medication Sig Start Date End Date Taking? Authorizing Provider  allopurinol (ZYLOPRIM) 300 MG tablet Take 1 tablet (300 mg total) by mouth daily. 12/22/19   [place], MD  buPROPion [facility]) 100 MG tablet Take 1 tablet a day x 1 week, then 1 tab BID 03/02/20   [place], MD  colchicine 0.6 MG tablet Take 1 tablet (0.6 mg total) by mouth 2 (two) times daily as needed (gout flare). 04/14/19   [place], MD  dicyclomine (BENTYL) 20 MG tablet Take 1 tablet (20 mg total) by mouth every 6 (six) hours as needed for spasms. 04/22/19   Gatha Mayer, MD  losartan (  COZAAR) 100 MG tablet Take 1 tablet (100 mg total) by mouth daily. 03/25/20   [place], MD  pantoprazole (PROTONIX) 40 MG tablet Take 1 tablet (40 mg total) by mouth daily. 03/22/20   Shelda Pal, DO  tadalafil (CIALIS) 20 MG tablet Take 1 tablet (20 mg total) by mouth daily as needed for erectile dysfunction. 12/03/18   [place], MD    Allergies    Pseudoephedrine  Review of Systems   Review of Systems  Constitutional: Negative for fever.  HENT: Positive for trouble swallowing.   Respiratory: Negative for shortness of breath.   Cardiovascular: Negative for chest pain.  Gastrointestinal: Positive for nausea. Negative for blood in stool.  Psychiatric/Behavioral: The patient is nervous/anxious.   All other systems reviewed and are negative.   Physical Exam Updated Vital Signs BP (!) 132/109 (BP Location: Right Arm)   Pulse (!) 110   Resp 18   Ht 1.854 m (6\' 1" )   Wt (!) 139.3 kg   SpO2 98%    BMI 40.50 kg/m   Physical Exam CONSTITUTIONAL: Well developed/well nourished, anxious HEAD: Normocephalic/atraumatic EYES: EOMI/PERRL ENMT: Mucous membranes moist, uvula midline without, no signs of thrush.  No stridor or drooling NECK: supple no meningeal signs SPINE/BACK:entire spine nontender CV: S1/S2 noted, no murmurs/rubs/gallops noted LUNGS: Lungs are clear to auscultation bilaterally, no apparent distress ABDOMEN: soft, nontender NEURO: Pt is awake/alert/appropriate, moves all extremitiesx4.  No facial droop.   EXTREMITIES: pulses normal/equal, full ROM SKIN: warm, color normal PSYCH: Anxious  ED Results / Procedures / Treatments   Labs (all labs ordered are listed, but only abnormal results are displayed) Labs Reviewed  CBC WITH DIFFERENTIAL/PLATELET - Abnormal; Notable for the following components:      Result Value   RBC 6.30 (*)    MCH 25.7 (*)    RDW 17.8 (*)    All other components within normal limits  COMPREHENSIVE METABOLIC PANEL - Abnormal; Notable for the following components:   AST 46 (*)    ALT 65 (*)    Total Bilirubin 2.7 (*)    All other components within normal limits  LIPASE, BLOOD    EKG EKG Interpretation  Date/Time:  Wednesday March 31 2020 22:23:27 EST Ventricular Rate:  123 PR Interval:  176 QRS Duration: 94 QT Interval:  280 QTC Calculation: 400 R Axis:   -21 Text Interpretation: Sinus tachycardia with frequent Premature ventricular complexes Anterior infarct , age undetermined Abnormal ECG Confirmed by Ripley Fraise ([id number]) on 04/01/2020 12:31:46 AM   Radiology DG Chest Port 1 View  Result Date: 04/01/2020 CLINICAL DATA:  Epigastric pain. EXAM: PORTABLE CHEST 1 VIEW COMPARISON:  February 26, 2020 FINDINGS: The lung volumes are low. There is cardiomegaly with findings of vascular congestion. No pneumothorax or large pleural effusion. No acute osseous abnormality. Evaluation is limited by patient body habitus. IMPRESSION:  Cardiomegaly with findings of vascular congestion. Electronically Signed   By: Constance Holster M.D.   On: 04/01/2020 02:29    Procedures Procedures   Medications Ordered in ED Medications  sodium chloride 0.9 % bolus 1,000 mL (0 mLs Intravenous Stopped 04/01/20 0411)  ondansetron (ZOFRAN) injection 4 mg (4 mg Intravenous Given 04/01/20 0232)  fentaNYL (SUBLIMAZE) injection 100 mcg (100 mcg Intravenous Given 04/01/20 0243)  alum & mag hydroxide-simeth (MAALOX/MYLANTA) 200-200-20 MG/5ML suspension 30 mL (30 mLs Oral Given 04/01/20 0327)    And  lidocaine (XYLOCAINE) 2 % viscous mouth solution 15 mL (15 mLs Oral Given 04/01/20  [place])    ED Course  I have reviewed the triage vital signs and the nursing notes.  Pertinent labs & imaging results that were available during my care of the patient were reviewed by me and considered in my medical decision making (see chart for details).    MDM Rules/Calculators/A&P                          2:46 AM Patient with history of esophagitis presents with nausea and pain after eating.  This has been ongoing for several weeks.  He has had this previously.  Previous EGD reveals  Esophagitis.  Patient appears uncomfortable, I have offered him nausea and pain medicines.  We will also perform screening labs and chest x-ray.   Patient with dramatic improvement.  He is in no acute distress.  He will follow-up with gastroenterology.  He was informed of mildly elevated LFTs. Discussed need to continue PPI and soft diet  ?  Congestion on x-ray, patient given small fluid bolus.  Denies shortness of breath, does not appear to be in CHF Final Clinical Impression(s) / ED Diagnoses Final diagnoses:  Esophageal dysphagia  Elevated liver function tests    Rx / DC Orders ED Discharge Orders    None       Ripley Fraise, MD 04/01/20 0425

## 2020-04-05 ENCOUNTER — Telehealth: Payer: Self-pay | Admitting: Gastroenterology

## 2020-04-05 ENCOUNTER — Ambulatory Visit: Payer: 59 | Admitting: Cardiology

## 2020-04-05 NOTE — Telephone Encounter (Addendum)
After hour call: multiple GI symptoms  Patient's wife concerned about chronic, progressive GI symptoms. Symptoms developed in January, but have become more worrisome. Has been having nausea, vomiting, bloating, dysphagia to solids and liquids, 30 pounds weight loss, insomnia, and now diarrhea that started yesterday. Wife is concerned because he looks very ill.   History of esophagitis on EGD with Dr. Carlean Purl 06/03/19, currently taking Nexium 40 mg daily.    Hospitalized in January for 4 days for acute pancreatitis in the pancreatic tail by CT, serum lipase 86, elevated TB but other liver enzymes were normal. Etiology of pancreatitis not identified but there was some question if they were related to herbal supplements. His wife feels like he has only declined since that time and is concerned with him quickly she feels his health is deteriorating.  Symptoms had resolved on follow-up with Dr. Larose Kells 03/02/20. But, documented again 03/22/20 on office visit with Dr. Nani Ravens.  Seen in ED earlier this week. Lipase 49, but AST now 46, ALT 65, alk phos 53, TB 2.7 on ED evaluation 04/01/20. WBC 4.9, hgb 16.2, platelets 188. Treated with nausea and pain medications and told to follow-up with GI.  Has office follow-up with Dr. Carlean Purl 04/08/20. She is concerned that with his progressive decline, he needs more than an office consultation. She was specifically requesting EGD.  Given her concerns, I encouraged her to go to the ED. She does not want to return because she doesn't think that they will be helpful.  I've encouraged her to keep the appointment with Dr. Carlean Purl 04/08/20 as a time when he can review his ongoing symptoms and help determine an appropriate evaluation plan. I confirmed that his appointment 04/08/20 is not for endoscopy.    She asked that Dr. Carlean Purl call her tomorrow, as she does not feel that her husband can wait until 04/08/20, but, did not want to take him back to the ED because of their experience  there. We, again, reviewed the ED as the best strategy for urgent evaluation.   Sheri, please call Mr. Whidbee in the morning for a symptom update and to see if there is any opportunity to move his clinic appointment up. Dr. Carlean Purl may wish to repeat contrasted cross-sectional imaging prior to his appointment later this week. Thank you.

## 2020-04-06 ENCOUNTER — Encounter: Payer: Self-pay | Admitting: Internal Medicine

## 2020-04-06 ENCOUNTER — Ambulatory Visit: Payer: 59 | Admitting: Internal Medicine

## 2020-04-06 ENCOUNTER — Other Ambulatory Visit: Payer: Self-pay | Admitting: Internal Medicine

## 2020-04-06 VITALS — BP 134/100 | HR 89 | Ht 73.0 in | Wt 301.8 lb

## 2020-04-06 DIAGNOSIS — R945 Abnormal results of liver function studies: Secondary | ICD-10-CM | POA: Diagnosis not present

## 2020-04-06 DIAGNOSIS — K8500 Idiopathic acute pancreatitis without necrosis or infection: Secondary | ICD-10-CM

## 2020-04-06 DIAGNOSIS — R1319 Other dysphagia: Secondary | ICD-10-CM | POA: Diagnosis not present

## 2020-04-06 DIAGNOSIS — R634 Abnormal weight loss: Secondary | ICD-10-CM

## 2020-04-06 DIAGNOSIS — R7989 Other specified abnormal findings of blood chemistry: Secondary | ICD-10-CM

## 2020-04-06 NOTE — Telephone Encounter (Signed)
Dr. Carlean Purl, do you want me to move him up to an APP appointment?

## 2020-04-06 NOTE — Telephone Encounter (Signed)
See today at 350

## 2020-04-06 NOTE — Patient Instructions (Addendum)
You have been scheduled for an endoscopy. Please follow written instructions given to you at your visit today. If you use inhalers (even only as needed), please bring them with you on the day of your procedure.  Due to recent changes in healthcare laws, you may see the results of your imaging and laboratory studies on MyChart before your provider has had a chance to review them.  We understand that in some cases there may be results that are confusing or concerning to you. Not all laboratory results come back in the same time frame and the provider may be waiting for multiple results in order to interpret others.  Please give [place] 48 hours in order for your provider to thoroughly review all the results before contacting the office for clarification of your results.   You have been scheduled for a CT scan of the abdomen and pelvis at [facility] are scheduled on 04/08/20 at 1:00pm. You should arrive 15 minutes prior to your appointment time for registration. Please follow the written instructions below on the day of your exam:  WARNING: IF YOU ARE ALLERGIC TO IODINE/X-RAY DYE, PLEASE NOTIFY RADIOLOGY IMMEDIATELY AT [id number]! YOU WILL BE GIVEN A 13 HOUR PREMEDICATION PREP.  1) Do not eat or drink anything after 9:00AM (4 hours prior to your test) 2) You have been given 2 bottles of oral contrast to drink. The solution may taste better if refrigerated, but do NOT add ice or any other liquid to this solution. Shake well before drinking.    Drink 1 bottle of contrast @ 11:00AM (2 hours prior to your exam)  Drink 1 bottle of contrast @ 12:00PM (1 hour prior to your exam)  You may take any medications as prescribed with a small amount of water, if necessary. If you take any of the following medications: METFORMIN, GLUCOPHAGE, GLUCOVANCE, AVANDAMET, RIOMET, FORTAMET, [place] MET, JANUMET, GLUMETZA or METAGLIP, you MAY be asked to HOLD this medication 48 hours AFTER the exam.  The purpose  of you drinking the oral contrast is to aid in the visualization of your intestinal tract. The contrast solution may cause some diarrhea. Depending on your individual set of symptoms, you may also receive an intravenous injection of x-ray contrast/dye. Plan on being at [facility] for 30 minutes or longer, depending on the type of exam you are having performed.  This test typically takes 30-45 minutes to complete.  If you have any questions regarding your exam or if you need to reschedule, you may call the CT department at [phone] between the hours of 8:00 am and 5:00 pm, Monday-Friday.  ________________________________________________________________________  I appreciate the opportunity to care for you. Silvano Rusk, MD, [facility]

## 2020-04-06 NOTE — Progress Notes (Signed)
Kevin Baxter 50 y.o. [birth year] [id number]  Assessment & Plan:   Encounter Diagnoses  Name Primary?  . Esophageal dysphagia Yes  . Loss of weight   . Idiopathic acute pancreatitis without infection or necrosis     This does not fit together neatly at this time dysphagia does not go along with pancreatitis.  Perhaps it was not pancreatitis.  The significant weight loss is concerning.  Question if he has some type of gastrointestinal malignancy.  Could he have a paraneoplastic syndrome causing pseudoachalasia?   My plan is to get an EGD and a CT scan of the abdomen and pelvis this week.  He is scheduled for EGD on March 11.  We are working on the CT scan.  It has been scheduled for March 10.  Further plans pending these results.  Possible esophageal dilation explained to the patient.The risks and benefits as well as alternatives of endoscopic procedure(s) have been discussed and reviewed. All questions answered. The patient agrees to proceed.  I appreciate the opportunity to care for this patient. CC: Colon Branch, MD     Subjective:   Chief Complaint: Dysphagia and weight loss  HPI  The patient is a 50 year old African-American man here with his wife, known to me from prior EGD and colonoscopy about a year ago, with about a months history of dysphagia and a 6-week history of weight loss.  He is having solid food dysphagia a dry cough and he was admitted in January briefly with changes in the tail of the pancreas suggesting pancreatitis, after having that imaging because of bloating and the cough (also had a CT of the chest).  Lipase was mildly elevated.  The dysphagia seem to come on after that.  He describes impact type dysphagia and then regurgitation of saliva.  He was able to eat yogurt today.  Liquids seem to go down all right.  No odynophagia.  Not much in the way of abdominal pain just a little bit of abdominal discomfort.  Labs on March 3 at last ED visit (ED visits  hospitalization records imaging reports and images reviewed independently) with hemoglobin 16.2 normal white count normal platelets.  Lipase was 49.  It was 76 on January 30 which was mildly elevated.  AST 46 ALT 65.  Bilirubin 2.7.  Colonoscopy 06/03/2019 random biopsies showed lymphocytic colitis --30 mm pedunculated transverse colon polyp-adenoma, and 4 diminutive adenomas   Upper endoscopy 06/03/2019 grade a reflux esophagitis and numerous diminutive gastric polyps that were fundic gland polyps  Wt Readings from Last 3 Encounters:  04/06/20 (!) 301 lb 12.8 oz (136.9 kg)  03/31/20 (!) 307 lb (139.3 kg)  03/22/20 (!) 309 lb (140.2 kg)   He was 317 pounds on February 1 and 3 128 pounds 1 year ago Allergies  Allergen Reactions  . Pseudoephedrine     REACTION: jittery   Current Meds  Medication Sig  . allopurinol (ZYLOPRIM) 300 MG tablet Take 1 tablet (300 mg total) by mouth daily.  Marland Kitchen buPROPion (WELLBUTRIN) 100 MG tablet Take 1 tablet a day x 1 week, then 1 tab BID  . dicyclomine (BENTYL) 20 MG tablet Take 1 tablet (20 mg total) by mouth every 6 (six) hours as needed for spasms.  Marland Kitchen losartan (COZAAR) 100 MG tablet Take 1 tablet (100 mg total) by mouth daily.  . pantoprazole (PROTONIX) 40 MG tablet Take 1 tablet (40 mg total) by mouth daily.   Past Medical History:  Diagnosis Date  .  Allergy   . Arthritis   . Fundic gland polyposis of stomach   . GERD with esophagitis   . Gout   . Hx of adenomatous colonic polyps   . Hypertension    Past Surgical History:  Procedure Laterality Date  . COLONOSCOPY  05/2019  . ESOPHAGOGASTRODUODENOSCOPY  05/2019  . HAND SURGERY Right    2006 for a FX   Social History   Social History Narrative   Household: pt, wife daughter (2013),  boy  (2006),    family history includes Cancer in his mother; Diabetes in his cousin, paternal aunt, sister, and sister; Heart failure in his father; Hypertension in his father.   Review of Systems As per  HPI  Objective:   Physical Exam BP (!) 134/100 (BP Location: Left Arm, Patient Position: Sitting)   Pulse 89   Ht 6\' 1"  (1.854 m)   Wt (!) 301 lb 12.8 oz (136.9 kg)   SpO2 98%   BMI 39.82 kg/m  Pleasant obese black man in no acute distress The eyes do not look icteric Lungs are clear No cervical or supraclavicular lymphadenopathy The heart sounds show normal S1-S2 with premature beats at times, no rubs murmurs or gallops The abdomen is soft nontender, no organomegaly or mass bowel sounds are present He has a somewhat flat affect but is alert and oriented x3 and otherwise pleasant and cooperative  Data reviewed as per HPI

## 2020-04-06 NOTE — Telephone Encounter (Signed)
Patient notified and he is rescheduled to today at 3:50

## 2020-04-08 ENCOUNTER — Ambulatory Visit (INDEPENDENT_AMBULATORY_CARE_PROVIDER_SITE_OTHER): Payer: 59

## 2020-04-08 ENCOUNTER — Ambulatory Visit: Payer: 59 | Admitting: Internal Medicine

## 2020-04-08 ENCOUNTER — Other Ambulatory Visit: Payer: Self-pay

## 2020-04-08 ENCOUNTER — Other Ambulatory Visit: Payer: Self-pay | Admitting: Internal Medicine

## 2020-04-08 DIAGNOSIS — K8500 Idiopathic acute pancreatitis without necrosis or infection: Secondary | ICD-10-CM

## 2020-04-08 DIAGNOSIS — R634 Abnormal weight loss: Secondary | ICD-10-CM

## 2020-04-08 IMAGING — CT CT ABD-PELV W/ CM
2 of 5 series · 16 of 46 positions shown, 18 images · IV contrast (APPLIED)
Comparison: [DATE]

CLINICAL DATA: Mid abdominal pain, nausea and vomiting, and
diarrhea. Unintentional 30 lb weight loss over past month. Acute
pancreatitis.

EXAM:
CT ABDOMEN AND PELVIS WITH CONTRAST
TECHNIQUE: Multidetector CT imaging of the abdomen and pelvis was performed
using the standard protocol following bolus administration of
intravenous contrast.
CONTRAST:  125mL OMNIPAQUE IOHEXOL 300 MG/ML  SOLN

[Series 2: axial st · axial · 0.80mm/px · z∈[-582,-77]mm · 13 of 113 slices shown, 15 images]
[im 6/113  soft-tissue]
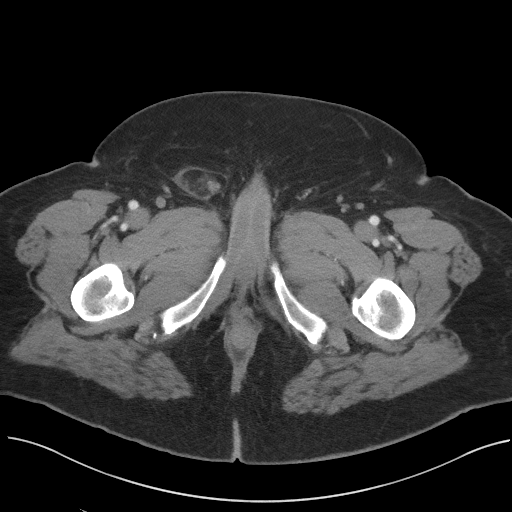
[im 6/113  bone]
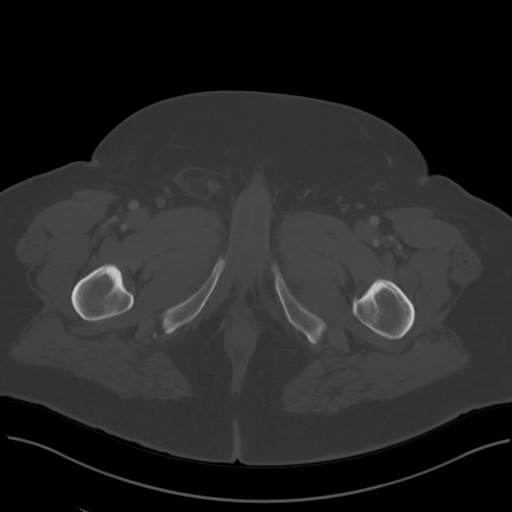
[im 18/113  soft-tissue]
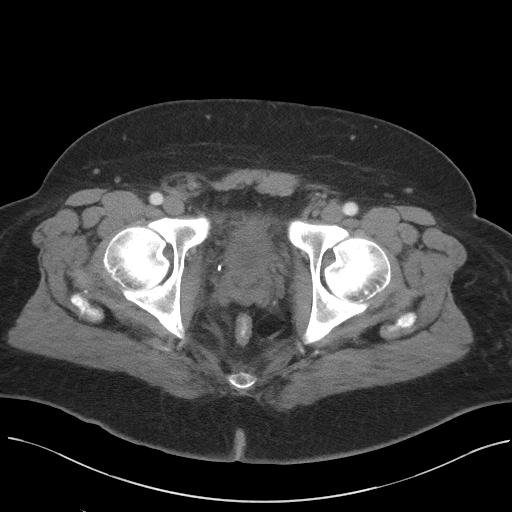
[im 24/113  soft-tissue]
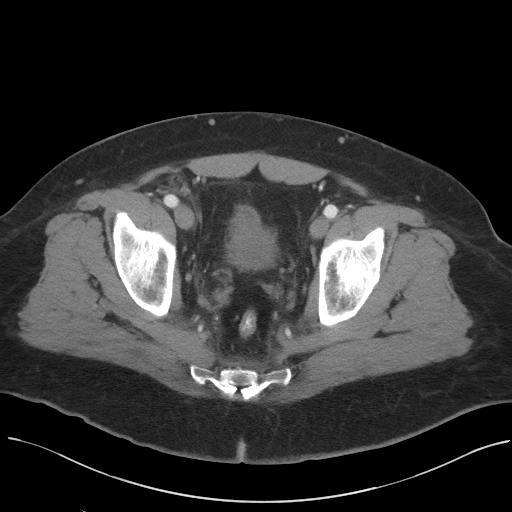
[im 30/113  soft-tissue]
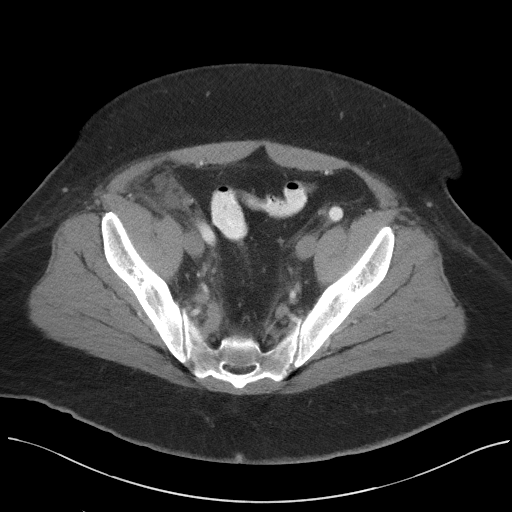
[im 42/113  soft-tissue]
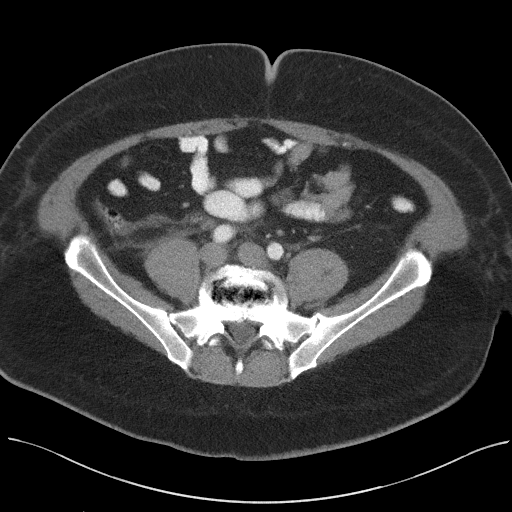
[im 48/113  soft-tissue]
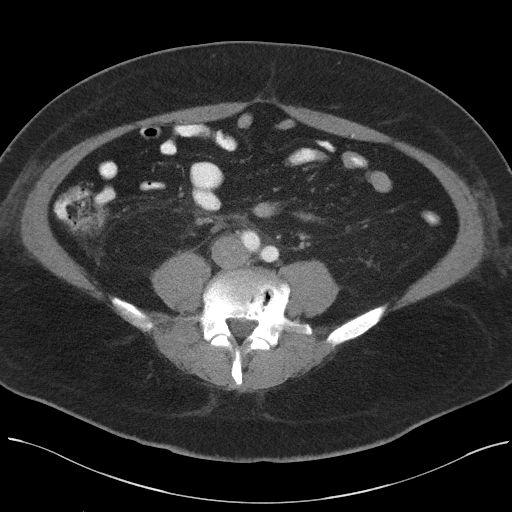
[im 59/113  soft-tissue]
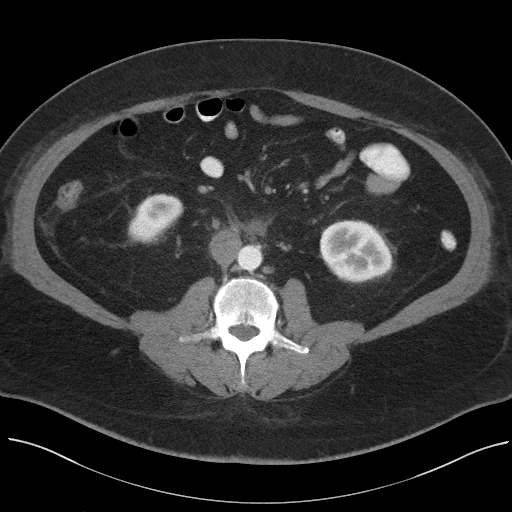
[im 65/113  soft-tissue]
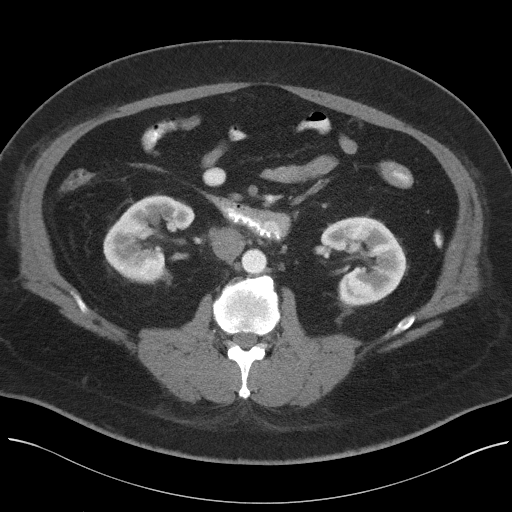
[im 71/113  soft-tissue]
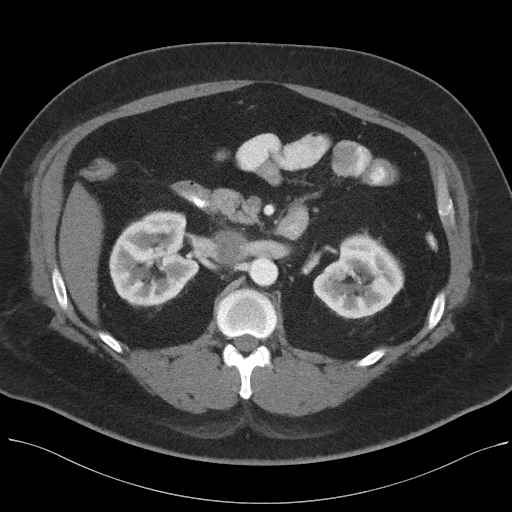
[im 71/113  bone]
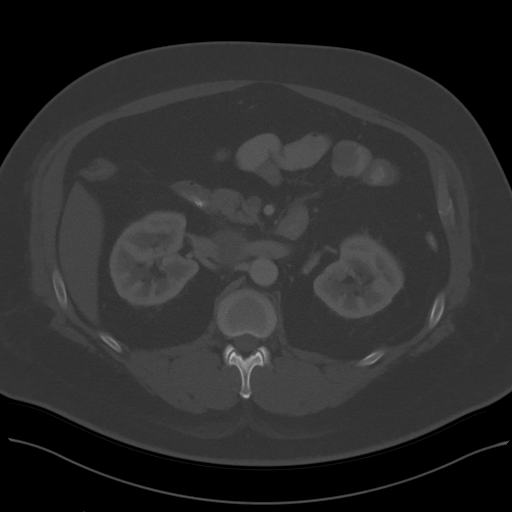
[im 83/113  soft-tissue]
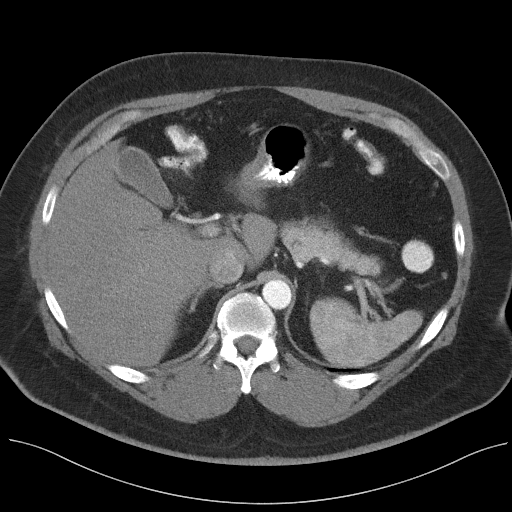
[im 89/113  soft-tissue]
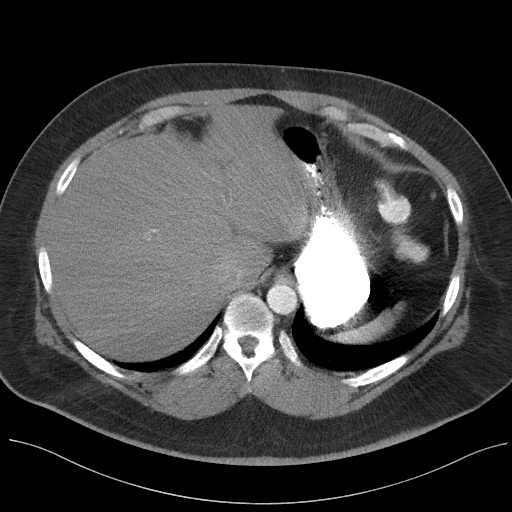
[im 95/113  soft-tissue]
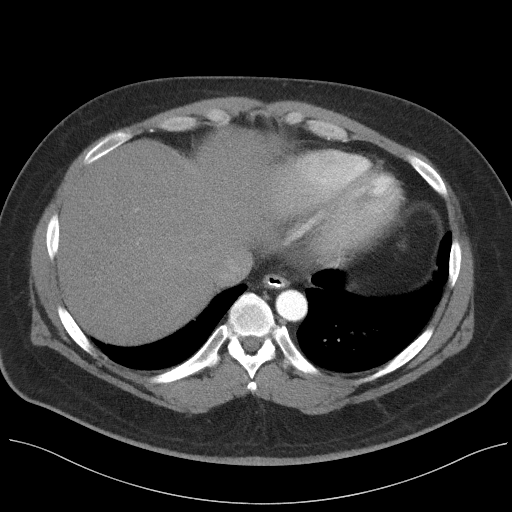
[im 107/113  soft-tissue]
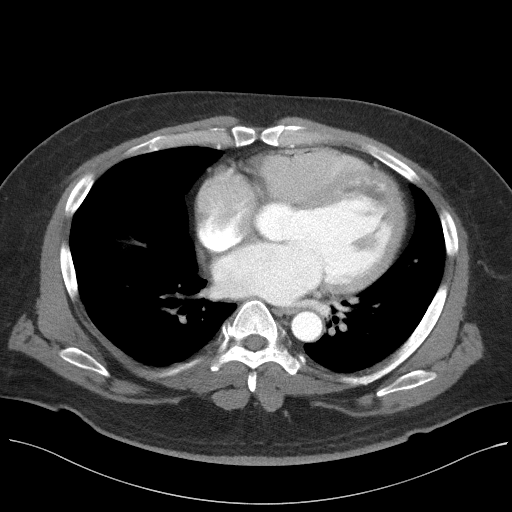

[Series 5: coronal st · coronal · 0.75mm/px · 3 of 104 slices shown]
[im 35/104  soft-tissue]
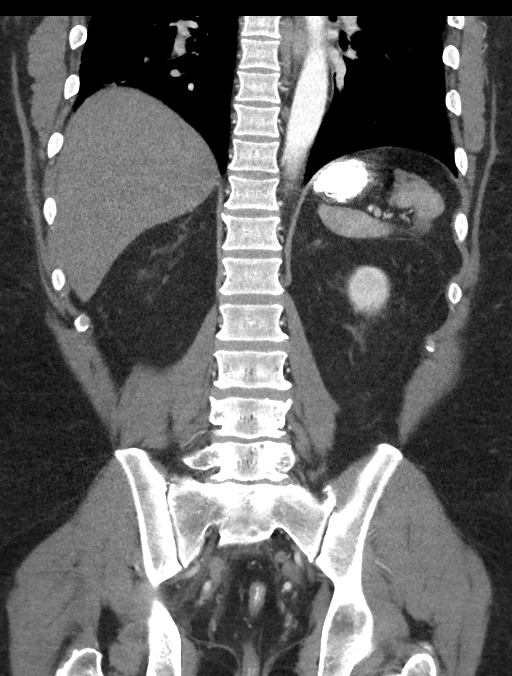
[im 46/104  soft-tissue]
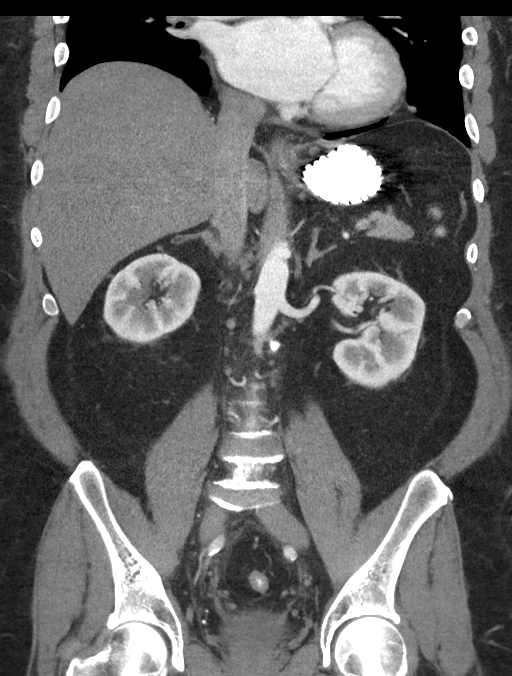
[im 58/104  soft-tissue]
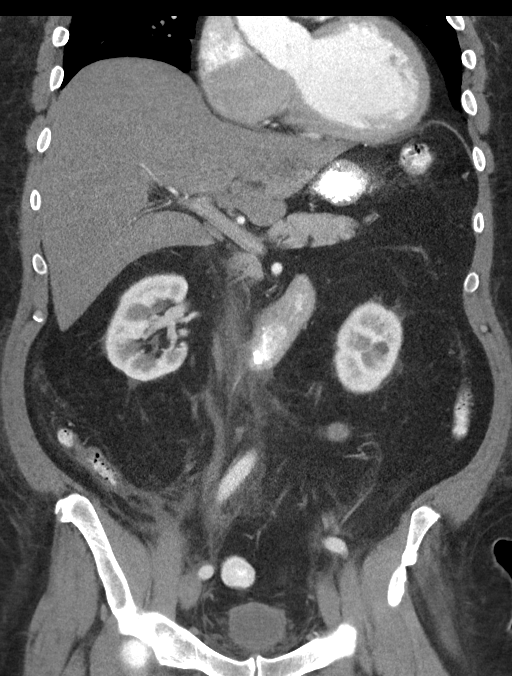

[16 of 46 positions shown; findings below may reference images not displayed]

FINDINGS: Lower Chest: No acute findings.

Hepatobiliary: No hepatic masses identified. Mild diffuse hepatic
steatosis again noted. Gallbladder is unremarkable. No evidence of
biliary ductal dilatation.

Pancreas: Previously seen swelling and adjacent soft tissue
stranding involving the pancreatic tail has nearly completely
resolved. Mild residual inflammatory changes are seen extending
inferiorly within the right abdominal retroperitoneum. No abnormal
fluid collections are identified. No evidence of pancreatic mass or
ductal dilatation.

Spleen: Within normal limits in size and appearance.

Adrenals/Urinary Tract: No masses identified. Stable small bilateral
renal cysts. No evidence of ureteral calculi or hydronephrosis.

Stomach/Bowel: No evidence of obstruction, inflammatory process or
abnormal fluid collections.

Vascular/Lymphatic: No pathologically enlarged lymph nodes. No
abdominal aortic aneurysm.

Reproductive:  No mass or other significant abnormality.

Other:  None.

Musculoskeletal:  No suspicious bone lesions identified.
IMPRESSION: Near complete resolution of acute pancreatitis since prior exam. No
evidence of pancreatic pseudocyst or other complication.

Stable mild hepatic steatosis.

## 2020-04-08 MED ORDER — IOHEXOL 300 MG/ML  SOLN
100.0000 mL | Freq: Once | INTRAMUSCULAR | Status: AC | PRN
  Administered 2020-04-08: 125 mL via INTRAVENOUS

## 2020-04-09 ENCOUNTER — Emergency Department (HOSPITAL_COMMUNITY)
Admission: EM | Admit: 2020-04-09 | Discharge: 2020-04-09 | Disposition: A | Discharge status: Home / Self Care | Payer: 59 | Source: Home / Self Care | Attending: Emergency Medicine | Admitting: Emergency Medicine

## 2020-04-09 ENCOUNTER — Other Ambulatory Visit: Payer: Self-pay

## 2020-04-09 ENCOUNTER — Emergency Department (HOSPITAL_COMMUNITY): Payer: 59

## 2020-04-09 ENCOUNTER — Encounter (HOSPITAL_COMMUNITY): Payer: Self-pay | Admitting: Emergency Medicine

## 2020-04-09 ENCOUNTER — Ambulatory Visit (AMBULATORY_SURGERY_CENTER): Payer: 59 | Admitting: Internal Medicine

## 2020-04-09 ENCOUNTER — Encounter: Payer: Self-pay | Admitting: Internal Medicine

## 2020-04-09 VITALS — BP 110/72 | HR 130 | Temp 96.6°F | Resp 13 | Ht 73.0 in | Wt 301.0 lb

## 2020-04-09 DIAGNOSIS — R000 Tachycardia, unspecified: Secondary | ICD-10-CM

## 2020-04-09 DIAGNOSIS — R1319 Other dysphagia: Secondary | ICD-10-CM | POA: Diagnosis not present

## 2020-04-09 DIAGNOSIS — R1310 Dysphagia, unspecified: Secondary | ICD-10-CM

## 2020-04-09 DIAGNOSIS — R4182 Altered mental status, unspecified: Secondary | ICD-10-CM | POA: Insufficient documentation

## 2020-04-09 DIAGNOSIS — K6389 Other specified diseases of intestine: Secondary | ICD-10-CM | POA: Diagnosis not present

## 2020-04-09 DIAGNOSIS — K317 Polyp of stomach and duodenum: Secondary | ICD-10-CM | POA: Diagnosis not present

## 2020-04-09 DIAGNOSIS — I10 Essential (primary) hypertension: Secondary | ICD-10-CM | POA: Insufficient documentation

## 2020-04-09 DIAGNOSIS — H539 Unspecified visual disturbance: Secondary | ICD-10-CM

## 2020-04-09 DIAGNOSIS — H53129 Transient visual loss, unspecified eye: Secondary | ICD-10-CM | POA: Insufficient documentation

## 2020-04-09 DIAGNOSIS — R634 Abnormal weight loss: Secondary | ICD-10-CM

## 2020-04-09 LAB — COMPREHENSIVE METABOLIC PANEL
ALT: 69 U/L — ABNORMAL HIGH (ref 0–44)
AST: 40 U/L (ref 15–41)
Albumin: 2.7 g/dL — ABNORMAL LOW (ref 3.5–5.0)
Alkaline Phosphatase: 61 U/L (ref 38–126)
Anion gap: 10 (ref 5–15)
BUN: 18 mg/dL (ref 6–20)
CO2: 23 mmol/L (ref 22–32)
Calcium: 8.3 mg/dL — ABNORMAL LOW (ref 8.9–10.3)
Chloride: 105 mmol/L (ref 98–111)
Creatinine, Ser: 1.14 mg/dL (ref 0.61–1.24)
GFR, Estimated: >60 mL/min (ref >60)
Glucose, Bld: 119 mg/dL — ABNORMAL HIGH (ref 70–99)
Potassium: 4.7 mmol/L (ref 3.5–5.1)
Sodium: 138 mmol/L (ref 135–145)
Total Bilirubin: 3.3 mg/dL — ABNORMAL HIGH (ref 0.3–1.2)
Total Protein: 6.3 g/dL — ABNORMAL LOW (ref 6.5–8.1)

## 2020-04-09 LAB — CBC WITH DIFFERENTIAL/PLATELET
Abs Immature Granulocytes: 0.02 10*3/uL (ref 0.00–0.07)
Basophils Absolute: 0 10*3/uL (ref 0.0–0.1)
Basophils Relative: 0 %
Eosinophils Absolute: 0 10*3/uL (ref 0.0–0.5)
Eosinophils Relative: 0 %
HCT: 50.8 % (ref 39.0–52.0)
Hemoglobin: 16 g/dL (ref 13.0–17.0)
Immature Granulocytes: 0 %
Lymphocytes Relative: 8 %
Lymphs Abs: 0.6 10*3/uL — ABNORMAL LOW (ref 0.7–4.0)
MCH: 25.4 pg — ABNORMAL LOW (ref 26.0–34.0)
MCHC: 31.5 g/dL (ref 30.0–36.0)
MCV: 80.5 fL (ref 80.0–100.0)
Monocytes Absolute: 0.7 10*3/uL (ref 0.1–1.0)
Monocytes Relative: 10 %
Neutro Abs: 5.6 10*3/uL (ref 1.7–7.7)
Neutrophils Relative %: 82 %
Platelets: 175 10*3/uL (ref 150–400)
RBC: 6.31 MIL/uL — ABNORMAL HIGH (ref 4.22–5.81)
RDW: 17.1 % — ABNORMAL HIGH (ref 11.5–15.5)
WBC: 6.9 10*3/uL (ref 4.0–10.5)
nRBC: 0 % (ref 0.0–0.2)

## 2020-04-09 LAB — TSH: TSH: 1.342 u[IU]/mL (ref 0.350–4.500)

## 2020-04-09 LAB — MAGNESIUM: Magnesium: 2 mg/dL (ref 1.7–2.4)

## 2020-04-09 IMAGING — MR MR HEAD WO/W CM
7 of 13 series · 23 of 48 positions shown · IV contrast (gadavist)
Comparison: None.

CLINICAL DATA: Altered mental status.

EXAM:
MRI HEAD WITHOUT AND WITH CONTRAST
TECHNIQUE: Multiplanar, multiecho pulse sequences of the brain and surrounding
structures were obtained without and with intravenous contrast.
CONTRAST:  10mL GADAVIST GADOBUTROL 1 MMOL/ML IV SOLN

[Series 2: DWI · axial · 3.0mm · 0.94mm/px · z∈[-144,+1]mm · 7 of 104 slices shown (1 of 2)]
[im 1/104]
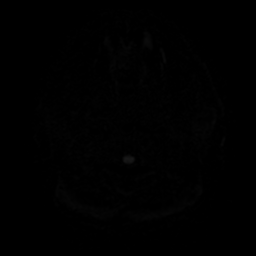
[im 18/104]
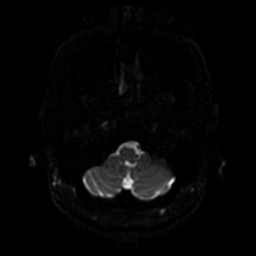
[im 35/104]
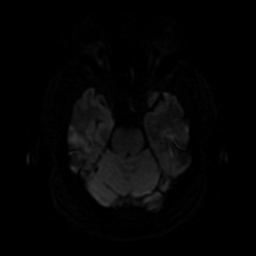
[im 52/104]
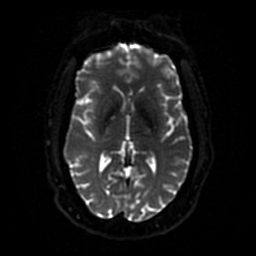
[im 69/104]
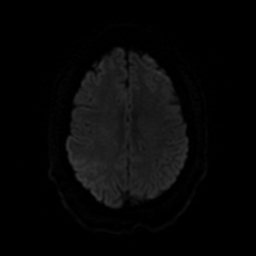
[im 86/104]
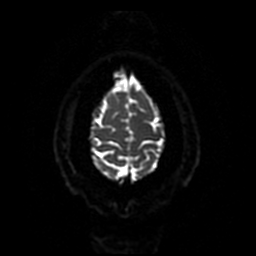
[im 104/104]
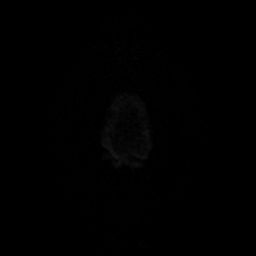

[Series 3: DWI · coronal · 4.0mm · 0.94mm/px · 5 of 80 slices shown (2 of 2)]
[im 1/80]
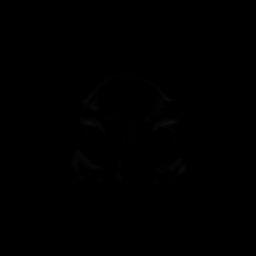
[im 20/80]
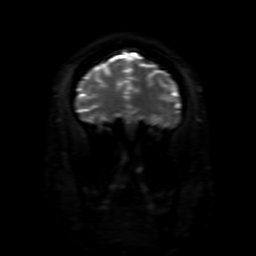
[im 40/80]
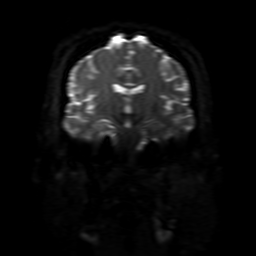
[im 60/80]
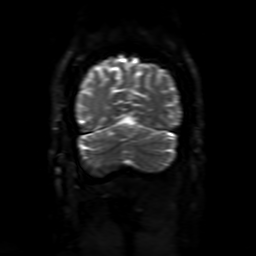
[im 80/80]
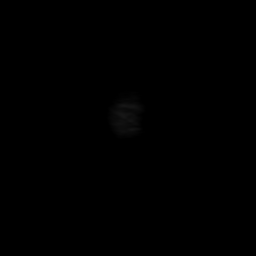

[Series 4: FLAIR · sagittal · 5.0mm · 0.23mm/px · 2 of 23 slices shown (1 of 2)]
[im 1/23]
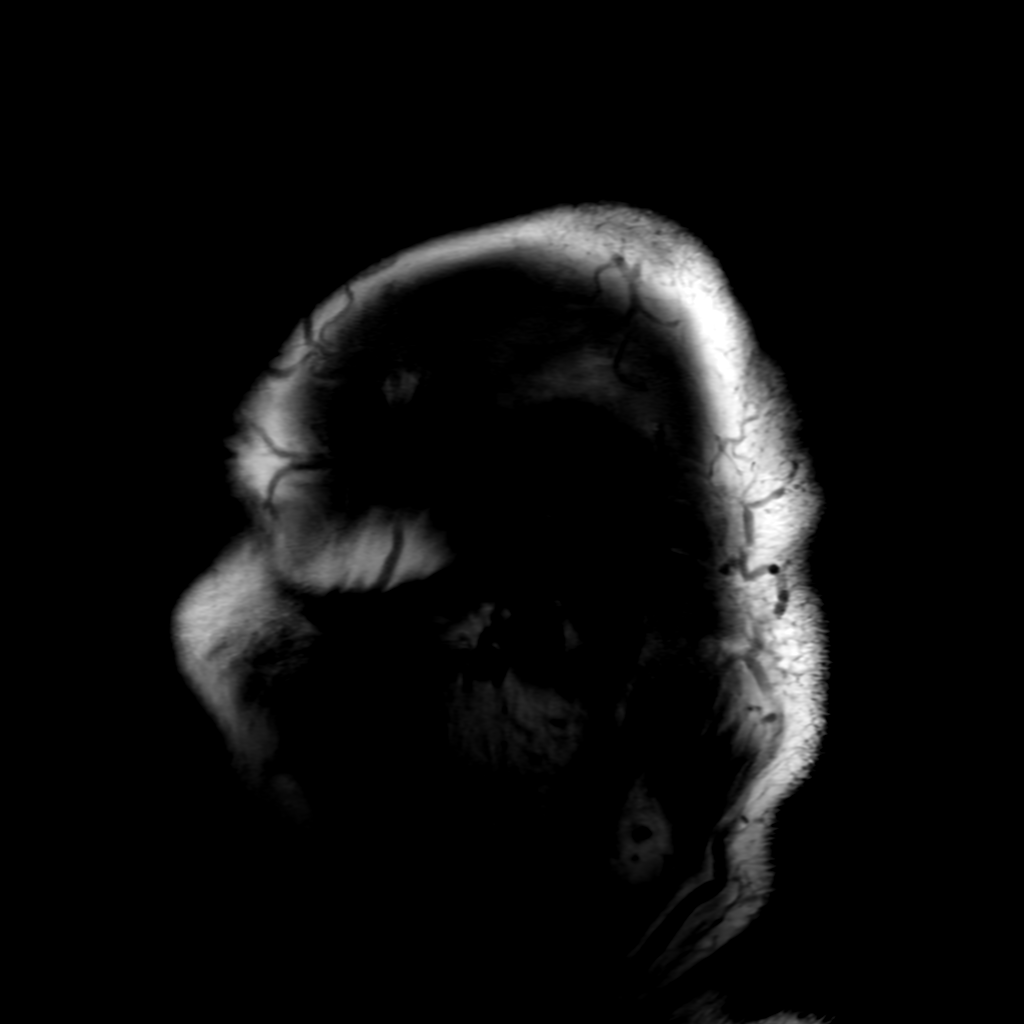
[im 23/23]
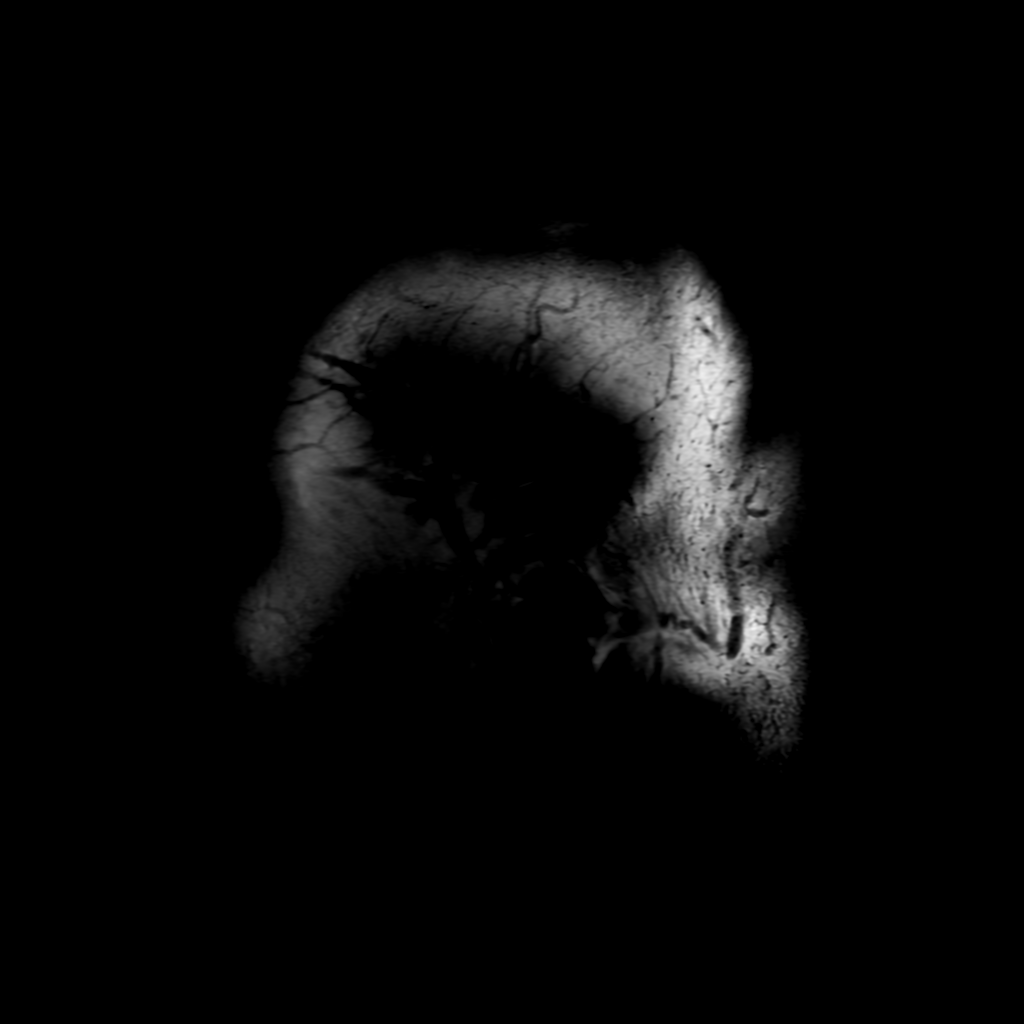

[Series 5: T2 · axial · 5.0mm · 0.23mm/px · 1 of 26 slices shown]
[im 1/26]
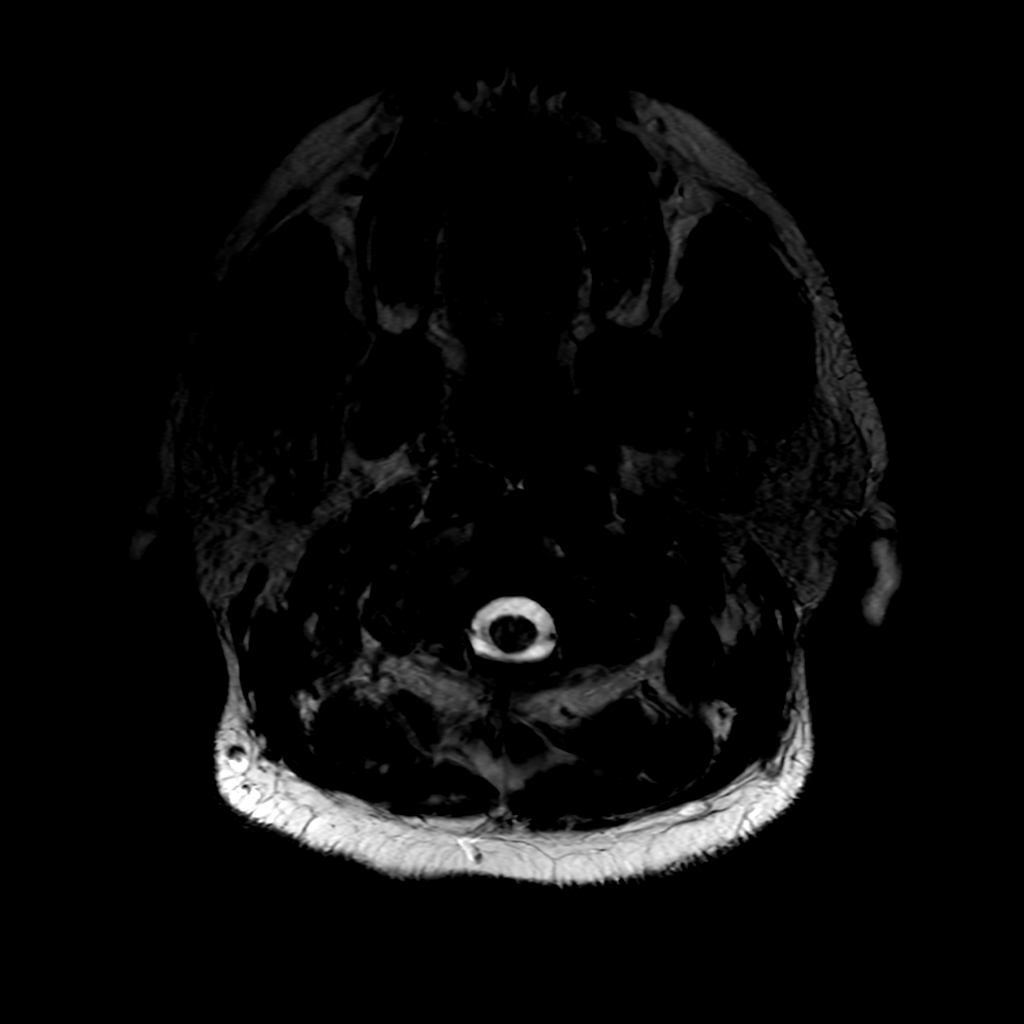

[Series 6: FLAIR · axial · 3.0mm · 0.45mm/px · z∈[-144,-1]mm · 2 of 26 slices shown (2 of 2)]
[im 1/26]
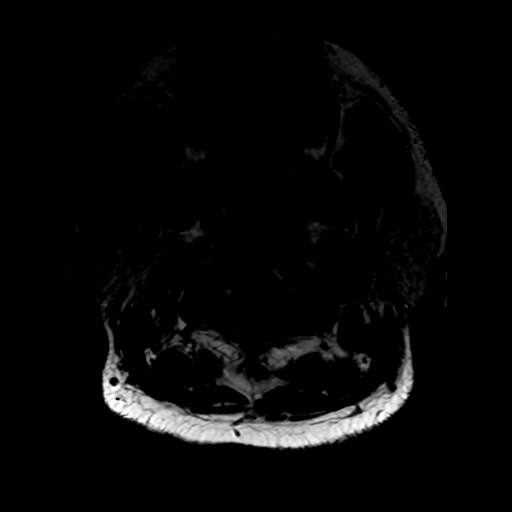
[im 26/26]
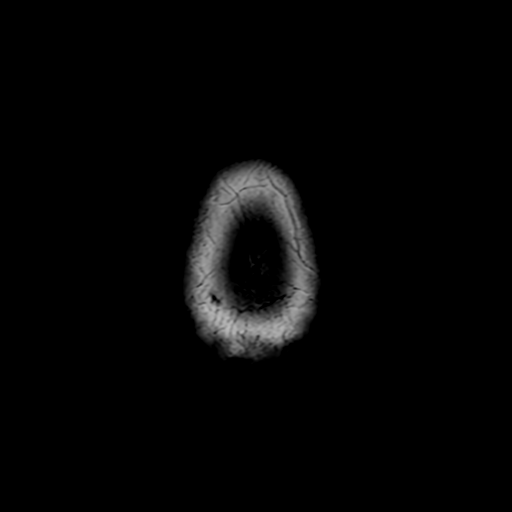

[Series 250: ADC · axial · 3.0mm · 0.94mm/px · z∈[-144,+1]mm · 3 of 52 slices shown (1 of 2)]
[im 1/52]
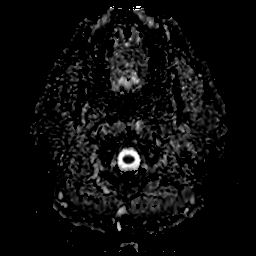
[im 26/52]
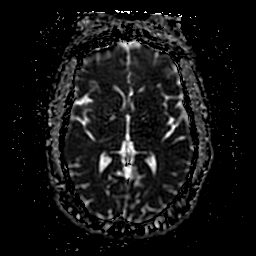
[im 52/52]
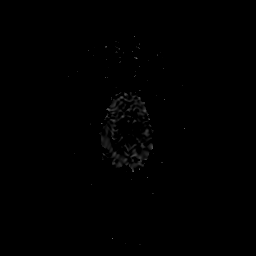

[Series 350: ADC · coronal · 4.0mm · 0.94mm/px · 3 of 40 slices shown (2 of 2)]
[im 1/40]
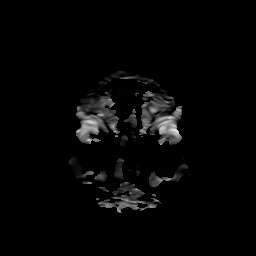
[im 20/40]
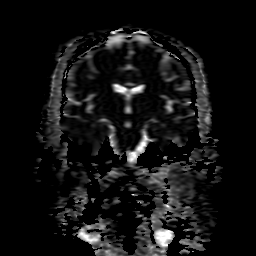
[im 40/40]
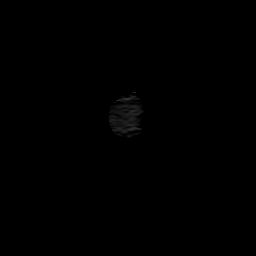

[23 of 48 positions shown; findings below may reference images not displayed]

FINDINGS: Brain: No acute infarction, hemorrhage, hydrocephalus, extra-axial
collection or mass lesion. Normal white matter.

Normal enhancement postcontrast administration

Image quality degraded by mild motion.

Vascular: Normal arterial flow voids.

Skull and upper cervical spine: No focal skeletal lesion.

Sinuses/Orbits: Paranasal sinuses clear.  Negative orbit

Other: None
IMPRESSION: Negative MRI of the head with contrast. Mild motion degrades image
quality

## 2020-04-09 MED ORDER — GADOBUTROL 1 MMOL/ML IV SOLN
10.0000 mL | Freq: Once | INTRAVENOUS | Status: AC | PRN
  Administered 2020-04-09: 10 mL via INTRAVENOUS

## 2020-04-09 MED ORDER — SODIUM CHLORIDE 0.9 % IV BOLUS
1000.0000 mL | Freq: Once | INTRAVENOUS | Status: AC
  Administered 2020-04-09: 1000 mL via INTRAVENOUS

## 2020-04-09 MED ORDER — SODIUM CHLORIDE 0.9 % IV SOLN: 500.0000 mL | INTRAVENOUS | Status: DC

## 2020-04-09 MED ORDER — SUCRALFATE 1 G PO TABS: 1.0000 g | ORAL_TABLET | Freq: Three times a day (TID) | ORAL | 0 refills | Status: DC

## 2020-04-09 MED ORDER — LORAZEPAM 2 MG/ML IJ SOLN
1.0000 mg | Freq: Once | INTRAMUSCULAR | Status: AC
  Administered 2020-04-09: 1 mg via INTRAVENOUS
  Filled 2020-04-09: qty 1

## 2020-04-09 MED ORDER — ONDANSETRON 4 MG PO TBDP: 4.0000 mg | ORAL_TABLET | Freq: Three times a day (TID) | ORAL | 0 refills | Status: DC | PRN

## 2020-04-09 NOTE — ED Triage Notes (Signed)
Pt BIB [facility] from an [facility] post upper endoscopy this AM for vomiting x1 month. Pt received propofol during procedure. Post procedure, pt c/o having a hard time focusing his vision and a period of confusion. No neuro deficits at this time. A/O x4.   EMS VS-  132/94 HR 123 CBG 124 SpO2 96%

## 2020-04-09 NOTE — Discharge Instructions (Addendum)
As discussed, your evaluation today has been largely reassuring.  But, it is important that you monitor your condition carefully, and do not hesitate to return to the ED if you develop new, or concerning changes in your condition. ? ?Otherwise, please follow-up with your physician for appropriate ongoing care. ? ?

## 2020-04-09 NOTE — ED Provider Notes (Signed)
5:34 PM Care of the patient assumed at signout.  Now, on discussing MRI results with him and his wife, he is awake, alert, pleasant, states he is hungry and thirsty. We lengthy conversation about his ongoing GI evaluation, and at bedside I discussed his endoscopy results, including discussion of the pictures from the scope itself. We discussed possibilities for his ongoing abdominal issues including the harvested polyps, functional dysphagia, food sensitivities, and the patient and his wife are amenable to following up with his GI team. MRI unremarkable, and he has no neuro complaints at all currently, including no vision changes. She and he are both amenable to outpatient follow-up.  We discussed initiation of several medications that make help with his symptoms pending this.   Carmin Muskrat, MD 04/09/20 1736

## 2020-04-09 NOTE — Progress Notes (Signed)
Called to room to assist during endoscopic procedure.  Patient ID and intended procedure confirmed with present staff. Received instructions for my participation in the procedure from the performing physician.  

## 2020-04-09 NOTE — Progress Notes (Signed)
0939 Robinul 0.1 mg IV given due large amount of secretions upon assessment.  MD made aware, vss  

## 2020-04-09 NOTE — Progress Notes (Signed)
5396 Ephedrine 10 mg given IV due to low BP, MD updated. Ephedrine 10 mg given IV due to low BP, MD updated.

## 2020-04-09 NOTE — Op Note (Addendum)
[facility] Patient Name: Kevin Baxter Procedure Date: 04/09/2020 9:31 AM MRN: [id number] Endoscopist: Gatha Mayer , MD Age: 50 Referring MD:  Date of Birth: 1970-08-13 Gender: Male Account #: [id number] Procedure:                Upper GI endoscopy Indications:              Dysphagia, Weight loss Medicines:                Propofol per Anesthesia, Monitored Anesthesia Care Procedure:                Pre-Anesthesia Assessment:                           - Prior to the procedure, a History and Physical                            was performed, and patient medications and                            allergies were reviewed. The patient's tolerance of                            previous anesthesia was also reviewed. The risks                            and benefits of the procedure and the sedation                            options and risks were discussed with the patient.                            All questions were answered, and informed consent                            was obtained. Prior Anticoagulants: The patient has                            taken no previous anticoagulant or antiplatelet                            agents. ASA Grade Assessment: II - A patient with                            mild systemic disease. After reviewing the risks                            and benefits, the patient was deemed in                            satisfactory condition to undergo the procedure.                           After obtaining informed consent, the endoscope was  passed under direct vision. Throughout the                            procedure, the patient's blood pressure, pulse, and                            oxygen saturations were monitored continuously. The                            Endoscope was introduced through the mouth, and                            advanced to the second part of duodenum. The upper                            GI  endoscopy was accomplished without difficulty.                            The patient tolerated the procedure well. Scope In: Scope Out: Findings:                 No endoscopic abnormality was evident in the                            esophagus to explain the patient's complaint of                            dysphagia.                           Multiple diminutive sessile polyps were found in                            the cardia and in the gastric fundus. Biopsies were                            taken with a cold forceps for histology.                            Verification of patient identification for the                            specimen was done. Estimated blood loss was minimal.                           The examined duodenum was normal.                           The cardia and gastric fundus were normal on                            retroflexion. Complications:            No immediate complications. Estimated Blood Loss:     Estimated blood loss was minimal. Impression:               -  No endoscopic esophageal abnormality to explain                            patient's dysphagia.                           - Multiple gastric polyps. Biopsied. Representative                            samples - look very similar to 2021 exam                           - Normal examined duodenum. Recommendation:           - Patient has a contact number available for                            emergencies. The signs and symptoms of potential                            delayed complications were discussed with the                            patient. Return to normal activities tomorrow.                            Written discharge instructions were provided to the                            patient.                           - Resume previous diet.                           - Continue present medications.                           - Await pathology results.                           - OFFICE WILL  SCHEDULE BARIUM SWALLOW WITH TABLET                            TO EVALUATE DYSPHAGIA                           TSH today - persistent tachycardia issues ?                            hyperthyroid                           Note that he required mask ventilation nasal                            trumpet to facilitate breathing after scope  withdrawn and while recovering from sedation - do                            suspect sleep apnea - he has a sleep test pending                           - CT abd/pelvis yesterday showed resolution of                            pancreatitis Gatha Mayer, MD 04/09/2020 10:05:18 AM This report has been signed electronically.

## 2020-04-09 NOTE — Progress Notes (Signed)
1000 Ephedrine 10 mg given IV due to low BP, MD updated. Pt experienced laryngeal spasm with sat >92%

## 2020-04-09 NOTE — Progress Notes (Signed)
Patient will be transported to the hospital via EMS because he is complaining of loss of vision after this procedure.  He had a transient hypotensive episode and airway spasm supported with oxygen and Ambu bag ventilation after the scope was removed and he was in the procedure room.  He does not appear to have any cranial nerve deficits other than this complaint of inability to see.  His vitals have returned to baseline with a persistent tachycardia.  Wife present and aware of plan.

## 2020-04-09 NOTE — ED Provider Notes (Addendum)
[facility] EMERGENCY DEPARTMENT Provider Note   CSN: [id number] Arrival date & time: 04/09/20  1125     History Chief Complaint  Patient presents with  . Altered Mental Status    Kevin Baxter is a 50 y.o. male.  The history is provided by the patient and medical records. No language interpreter was used.  Neurologic Problem This is a new problem. The current episode started 1 to 2 hours ago. The problem has been resolved. Pertinent negatives include no chest pain, no abdominal pain, no headaches and no shortness of breath. Nothing aggravates the symptoms. Nothing relieves the symptoms. He has tried nothing for the symptoms. The treatment provided no relief.       Past Medical History:  Diagnosis Date  . Allergy   . Anxiety   . Arthritis   . Fundic gland polyposis of stomach   . GERD with esophagitis   . Gout   . Hx of adenomatous colonic polyps   . Hypertension     Patient Active Problem List   Diagnosis Date Noted  . Loss of weight 04/06/2020  . Esophageal dysphagia 04/06/2020  . Abnormal LFTs 04/06/2020  . Depression, major, single episode, moderate ([place]) 03/02/2020  . Pancreatitis, acute 02/26/2020  . Acute pancreatitis 02/26/2020  . Hx of adenomatous colonic polyps   . Morbid obesity ([place]) 04/22/2015  . PCP NOTES >>>>>>>>>>>>>>>>>>>>>>>>> 04/22/2015  . Hyperglycemia 03/11/2014  . Erectile dysfunction 07/21/2011  . Annual physical exam 01/05/2011  . Gout 03/14/2006  . Essential hypertension 03/14/2006  . ALLERGIC RHINITIS 03/14/2006    Past Surgical History:  Procedure Laterality Date  . COLONOSCOPY  05/2019  . ESOPHAGOGASTRODUODENOSCOPY  05/2019  . HAND SURGERY Right    2006 for a FX       Family History  Problem Relation Age of Onset  . Cancer Mother        CERVICAL  . Hypertension Father   . Heart failure Father        no MI, d/t etoh  . Diabetes Sister   . Diabetes Sister   . Diabetes Paternal Aunt   . Diabetes  Cousin   . Colon cancer Neg Hx   . Prostate cancer Neg Hx   . Stroke Neg Hx   . Esophageal cancer Neg Hx   . Rectal cancer Neg Hx   . Stomach cancer Neg Hx     Social History   Tobacco Use  . Smoking status: Never Smoker  . Smokeless tobacco: Never Used  Vaping Use  . Vaping Use: Never used  Substance Use Topics  . Alcohol use: Yes    Comment: very rare   . Drug use: No    Home Medications Prior to Admission medications   Medication Sig Start Date End Date Taking? Authorizing Provider  allopurinol (ZYLOPRIM) 300 MG tablet Take 1 tablet (300 mg total) by mouth daily. 12/22/19   Colon Branch, MD  [facility]) 100 MG tablet Take 1 tablet a day x 1 week, then 1 tab BID 03/02/20   Colon Branch, MD  dicyclomine (BENTYL) 20 MG tablet Take 1 tablet (20 mg total) by mouth every 6 (six) hours as needed for spasms. 04/22/19   Gatha Mayer, MD  losartan (COZAAR) 50 MG tablet Take 2 tablets (100 mg total) by mouth daily. 04/08/20   Colon Branch, MD  pantoprazole (PROTONIX) 40 MG tablet Take 1 tablet (40 mg total) by mouth daily. 03/22/20  Shelda Pal, DO    Allergies    Pseudoephedrine  Review of Systems   Review of Systems  Constitutional: Negative for chills, diaphoresis, fatigue and fever.  HENT: Positive for trouble swallowing. Negative for congestion.   Eyes: Positive for visual disturbance (resolved).  Respiratory: Negative for cough, chest tightness, shortness of breath and wheezing.   Cardiovascular: Negative for chest pain, palpitations and leg swelling.  Gastrointestinal: Positive for nausea (recently) and vomiting (recently). Negative for abdominal pain, constipation and diarrhea.  Genitourinary: Negative for flank pain and frequency.  Musculoskeletal: Negative for back pain, neck pain and neck stiffness.  Neurological: Negative for dizziness, seizures, syncope, speech difficulty, weakness, light-headedness, numbness and headaches.   Psychiatric/Behavioral: Positive for confusion (resolved). Negative for agitation.  All other systems reviewed and are negative.   Physical Exam Updated Vital Signs BP (!) 138/101   Pulse 63   Temp 98.1 F (36.7 C) (Oral)   Resp (!) 27   Ht 6\' 1"  (1.854 m)   Wt 133.8 kg   SpO2 95%   BMI 38.92 kg/m   Physical Exam Vitals and nursing note reviewed.  Constitutional:      General: He is not in acute distress.    Appearance: He is well-developed. He is not ill-appearing, toxic-appearing or diaphoretic.  HENT:     Head: Normocephalic and atraumatic.     Nose: No congestion or rhinorrhea.     Mouth/Throat:     Mouth: Mucous membranes are dry.     Pharynx: No oropharyngeal exudate or posterior oropharyngeal erythema.  Eyes:     General: No visual field deficit.    Extraocular Movements: Extraocular movements intact.     Conjunctiva/sclera: Conjunctivae normal.     Pupils: Pupils are equal, round, and reactive to light.  Cardiovascular:     Rate and Rhythm: Regular rhythm. Tachycardia present.     Pulses: Normal pulses.     Heart sounds: No murmur heard.   Pulmonary:     Effort: Pulmonary effort is normal. No respiratory distress.     Breath sounds: Normal breath sounds.  Abdominal:     Palpations: Abdomen is soft.     Tenderness: There is no abdominal tenderness. There is no right CVA tenderness, left CVA tenderness, guarding or rebound.  Musculoskeletal:        General: No tenderness.     Cervical back: Neck supple. No tenderness.  Skin:    General: Skin is warm and dry.     Capillary Refill: Capillary refill takes less than 2 seconds.     Findings: No erythema.  Neurological:     General: No focal deficit present.     Mental Status: He is alert. Mental status is at baseline.     GCS: GCS eye subscore is 4. GCS verbal subscore is 5. GCS motor subscore is 6.     Cranial Nerves: No cranial nerve deficit, dysarthria or facial asymmetry.     Sensory: No sensory  deficit.     Motor: No weakness or abnormal muscle tone.     Coordination: Coordination normal. Finger-Nose-Finger Test normal.     Comments: Pupil symmetric with normal extraocular movements.  Normal visual fields.  Symmetric smile.  Clear speech.  Normal finger-nose-finger testing, normal sensation strength in all extremities.  Psychiatric:        Mood and Affect: Mood normal.     ED Results / Procedures / Treatments   Labs (all labs ordered are listed, but only abnormal results  are displayed) Labs Reviewed  CBC WITH DIFFERENTIAL/PLATELET - Abnormal; Notable for the following components:      Result Value   RBC 6.31 (*)    MCH 25.4 (*)    RDW 17.1 (*)    Lymphs Abs 0.6 (*)    All other components within normal limits  COMPREHENSIVE METABOLIC PANEL - Abnormal; Notable for the following components:   Glucose, Bld 119 (*)    Calcium 8.3 (*)    Total Protein 6.3 (*)    Albumin 2.7 (*)    ALT 69 (*)    Total Bilirubin 3.3 (*)    All other components within normal limits  TSH  MAGNESIUM    EKG EKG Interpretation  Date/Time:  Friday April 09 2020 11:33:42 EST Ventricular Rate:  128 PR Interval:    QRS Duration: 102 QT Interval:  299 QTC Calculation: 437 R Axis:   -87 Text Interpretation: Sinus tachycardia Probable left atrial enlargement Left anterior fascicular block Consider anterior infarct When compared to prior, faster rate. No STEMI Confirmed by Antony Blackbird [phone]) on 04/09/2020 11:42:53 AM   Radiology CT Abdomen Pelvis W Contrast  Result Date: 04/08/2020 CLINICAL DATA:  Mid abdominal pain, nausea and vomiting, and diarrhea. Unintentional 30 lb weight loss over past month. Acute pancreatitis. EXAM: CT ABDOMEN AND PELVIS WITH CONTRAST TECHNIQUE: Multidetector CT imaging of the abdomen and pelvis was performed using the standard protocol following bolus administration of intravenous contrast. CONTRAST:  187mL OMNIPAQUE IOHEXOL 300 MG/ML  SOLN COMPARISON:  02/26/2020  FINDINGS: Lower Chest: No acute findings. Hepatobiliary: No hepatic masses identified. Mild diffuse hepatic steatosis again noted. Gallbladder is unremarkable. No evidence of biliary ductal dilatation. Pancreas: Previously seen swelling and adjacent soft tissue stranding involving the pancreatic tail has nearly completely resolved. Mild residual inflammatory changes are seen extending inferiorly within the right abdominal retroperitoneum. No abnormal fluid collections are identified. No evidence of pancreatic mass or ductal dilatation. Spleen: Within normal limits in size and appearance. Adrenals/Urinary Tract: No masses identified. Stable small bilateral renal cysts. No evidence of ureteral calculi or hydronephrosis. Stomach/Bowel: No evidence of obstruction, inflammatory process or abnormal fluid collections. Vascular/Lymphatic: No pathologically enlarged lymph nodes. No abdominal aortic aneurysm. Reproductive:  No mass or other significant abnormality. Other:  None. Musculoskeletal:  No suspicious bone lesions identified. IMPRESSION: Near complete resolution of acute pancreatitis since prior exam. No evidence of pancreatic pseudocyst or other complication. Stable mild hepatic steatosis. Electronically Signed   By: Marlaine Hind M.D.   On: 04/08/2020 13:51    Procedures Procedures   Medications Ordered in ED Medications  sodium chloride 0.9 % bolus 1,000 mL (has no administration in time range)  sodium chloride 0.9 % bolus 1,000 mL (1,000 mLs Intravenous New Bag/Given 04/09/20 1153)  sodium chloride 0.9 % bolus 1,000 mL (1,000 mLs Intravenous New Bag/Given 04/09/20 1307)    ED Course  I have reviewed the triage vital signs and the nursing notes.  Pertinent labs & imaging results that were available during my care of the patient were reviewed by me and considered in my medical decision making (see chart for details).    MDM Rules/Calculators/A&P                          Kevin Baxter is a 50  y.o. male with a past medical history significant for hypertension, GERD, gout, and report of esophageal dysphagia who presents from the postoperative area of a gastroenterology procedural  center for evaluation of altered mental status and transient vision changes.  According to EMS, patient had an endoscopy today where he had several polyps biopsied and when coming to from anesthesia, was complaining of bilateral blurry vision and vision changes with some confusion.  Documentation show that patient had some hypotension at some point and was given medication to help this and he was given propofol and Robinul during the procedure.  EMS reports that the patient's glucose was normal during transport and his vision has improved throughout transport.  On arrival, patient reports his vision is normal and he is just feeling dry mouth and dehydrated.  His heart rate is in the 120 range during initial evaluation.  Patient says that he is getting evaluated because had dysphagia for some time and they are currently working up with gastroenterology.  He denies significant headache, diplopia, numbness, tingling, weakness of extremities.  Denies any speech difficulty, dizziness, and reports his vision is back to normal now.  He just feels dehydrated.  On exam, no focal neurologic deficits.  Normal pupil exam, normal extraocular movements.  Normal visual fields.  Clear speech, symmetric smile.  Normal sensation and strength in all extremities with normal finger-nose-finger testing bilaterally.  Lungs clear and chest nontender, abdomen nontender.  Patient is tachycardic and has dry mucous membranes.  Suspect may be related to dehydration and Robinul.  I called neurology who after review of the patient's exam and case suspect he had some difficulty waking up from anesthesia and some of the symptoms are related to dehydration and the medications he was given.  They agree with not getting any CT or MRI at this time and agree  with hydration and reassessment and monitoring.  If patient does not develop any new symptoms, they feel he safe for discharge home with continued outpatient work-up for GI troubles.  I spoke with the patient and they agree.  The documentation from the facility does show that they requested a TSH to evaluate for his persistent tachycardia.  Family reports he has had nausea and vomiting recently so we will get screening labs to make sure there is not significant AKI or electrolyte abnormalities.  Will give the patient more fluids and continue to monitor.  Will allow him to eat and drink.  Anticipate reassessment for work-up and anticipate he will be stable for discharge home after work-up.       2:12 PM Patient's labs began to return and were overall reassuring.  Patient has no evidence of AKI and bilirubin is slightly more elevated and ALT however other electrolytes reassuring.  TSH is normal.  Magnesium normal.  Patient had some improvement in his heart rate from around 130 now down to around 120.  Will give more fluids.  The GI provider reached out via secure message and expressed concern about how the patient looked and felt further imaging should be considered to rule out intracranial mass especially given the patient dysphagia with nausea and vomiting.  I spoke with neurology who agreed and we will get MRI brain with and without.  If MRI is reassuring, anticipate discharge home after rehydration and symptoms can likely be attributed to anesthesia wearing off.  Care will be transferred to oncoming team awaiting for results of MRI and reassessment.   Final Clinical Impression(s) / ED Diagnoses Final diagnoses:  Transient vision disturbance     Clinical Impression: 1. Transient vision disturbance     Disposition: Reassess after MRI and more fluids.  If patient read  the baseline, anticipate discharge home with follow-up.  This note was prepared with assistance of TEFL teacher. Occasional wrong-word or sound-a-like substitutions may have occurred due to the inherent limitations of voice recognition software.      Aleshka Corney, Gwenyth Allegra, MD 04/09/20 1521    Daniell Paradise, Gwenyth Allegra, MD 04/09/20 1527

## 2020-04-09 NOTE — ED Notes (Signed)
Pt is in MRI at this time.

## 2020-04-09 NOTE — Progress Notes (Signed)
Report given to PACU, vss 

## 2020-04-09 NOTE — Progress Notes (Signed)
Upper GI endoscopy report given to patient's wife.

## 2020-04-09 NOTE — ED Notes (Signed)
Pt has asked 2-3 times since returning from MRI what the results were. This nurse has advised pt of results each time. Pt did not remember MD going over results.

## 2020-04-09 NOTE — Progress Notes (Signed)
Pt. Brought in RR, C. Wall CRNA in attendance.  Dr. Carlean Purl in frequently trying to talk w/ rouse pt.  Non purposeful movement.  Pt. With blank stare, no indication of visual contact.  Pt.'s wife brought into RR.  States she has never seen him like this.  Pt. Seems agitated, and as though battling with things unseen, but not combative.

## 2020-04-09 NOTE — Progress Notes (Signed)
0940 HR > 100 with esmolol 25 mg given IV, MD updated, vss 

## 2020-04-09 NOTE — Progress Notes (Signed)
Pt. Transferred to [facility] ER via EMS to r/o suspected stroke.  Hand motion in both eyes.  Skin warm and dry.  Pt. "trying to focus", unable to see clearly.  Pt. Able to talk.  Agitated, but not combative.

## 2020-04-09 NOTE — Patient Instructions (Signed)
I do not see a cause for symptoms. Could be a problem with the esophagus function.  Will schedule a barium swallow test (xray).  Sometimes stress reactions (anxiety) actually cause physical symptoms but we need to keep checking for other causes.  I rechecked the tiny stomach polyps you have as well - do not think these are a problem.  I appreciate the opportunity to care for you. Gatha Mayer, MD, [facility]

## 2020-04-11 ENCOUNTER — Telehealth: Payer: Self-pay | Admitting: Gastroenterology

## 2020-04-11 ENCOUNTER — Other Ambulatory Visit: Payer: Self-pay

## 2020-04-11 ENCOUNTER — Emergency Department (HOSPITAL_BASED_OUTPATIENT_CLINIC_OR_DEPARTMENT_OTHER): Payer: 59

## 2020-04-11 ENCOUNTER — Inpatient Hospital Stay (HOSPITAL_BASED_OUTPATIENT_CLINIC_OR_DEPARTMENT_OTHER)
Admission: EM | Admit: 2020-04-11 | Discharge: 2020-04-24 | DRG: 163 | Disposition: A | Discharge status: Home / Self Care | Payer: 59 | Attending: Internal Medicine | Admitting: Internal Medicine

## 2020-04-11 ENCOUNTER — Encounter (HOSPITAL_BASED_OUTPATIENT_CLINIC_OR_DEPARTMENT_OTHER): Payer: Self-pay

## 2020-04-11 DIAGNOSIS — I2721 Secondary pulmonary arterial hypertension: Secondary | ICD-10-CM | POA: Diagnosis present

## 2020-04-11 DIAGNOSIS — R079 Chest pain, unspecified: Secondary | ICD-10-CM

## 2020-04-11 DIAGNOSIS — E860 Dehydration: Secondary | ICD-10-CM | POA: Diagnosis present

## 2020-04-11 DIAGNOSIS — Z86010 Personal history of colonic polyps: Secondary | ICD-10-CM

## 2020-04-11 DIAGNOSIS — K920 Hematemesis: Secondary | ICD-10-CM | POA: Diagnosis present

## 2020-04-11 DIAGNOSIS — D751 Secondary polycythemia: Secondary | ICD-10-CM | POA: Diagnosis present

## 2020-04-11 DIAGNOSIS — I428 Other cardiomyopathies: Secondary | ICD-10-CM | POA: Diagnosis present

## 2020-04-11 DIAGNOSIS — R61 Generalized hyperhidrosis: Secondary | ICD-10-CM | POA: Diagnosis present

## 2020-04-11 DIAGNOSIS — Z20822 Contact with and (suspected) exposure to covid-19: Secondary | ICD-10-CM | POA: Diagnosis not present

## 2020-04-11 DIAGNOSIS — J9601 Acute respiratory failure with hypoxia: Secondary | ICD-10-CM | POA: Diagnosis present

## 2020-04-11 DIAGNOSIS — K3184 Gastroparesis: Secondary | ICD-10-CM | POA: Diagnosis present

## 2020-04-11 DIAGNOSIS — E872 Acidosis: Secondary | ICD-10-CM | POA: Diagnosis present

## 2020-04-11 DIAGNOSIS — M109 Gout, unspecified: Secondary | ICD-10-CM | POA: Diagnosis present

## 2020-04-11 DIAGNOSIS — Z8719 Personal history of other diseases of the digestive system: Secondary | ICD-10-CM | POA: Diagnosis not present

## 2020-04-11 DIAGNOSIS — R570 Cardiogenic shock: Secondary | ICD-10-CM | POA: Diagnosis not present

## 2020-04-11 DIAGNOSIS — I5023 Acute on chronic systolic (congestive) heart failure: Secondary | ICD-10-CM | POA: Diagnosis present

## 2020-04-11 DIAGNOSIS — I77810 Thoracic aortic ectasia: Secondary | ICD-10-CM | POA: Diagnosis present

## 2020-04-11 DIAGNOSIS — K812 Acute cholecystitis with chronic cholecystitis: Secondary | ICD-10-CM | POA: Diagnosis present

## 2020-04-11 DIAGNOSIS — R9430 Abnormal result of cardiovascular function study, unspecified: Secondary | ICD-10-CM

## 2020-04-11 DIAGNOSIS — K828 Other specified diseases of gallbladder: Secondary | ICD-10-CM | POA: Diagnosis present

## 2020-04-11 DIAGNOSIS — K8020 Calculus of gallbladder without cholecystitis without obstruction: Secondary | ICD-10-CM

## 2020-04-11 DIAGNOSIS — G4700 Insomnia, unspecified: Secondary | ICD-10-CM | POA: Diagnosis present

## 2020-04-11 DIAGNOSIS — R042 Hemoptysis: Secondary | ICD-10-CM | POA: Diagnosis present

## 2020-04-11 DIAGNOSIS — Z888 Allergy status to other drugs, medicaments and biological substances status: Secondary | ICD-10-CM

## 2020-04-11 DIAGNOSIS — R2241 Localized swelling, mass and lump, right lower limb: Secondary | ICD-10-CM

## 2020-04-11 DIAGNOSIS — N179 Acute kidney failure, unspecified: Secondary | ICD-10-CM | POA: Diagnosis not present

## 2020-04-11 DIAGNOSIS — Z833 Family history of diabetes mellitus: Secondary | ICD-10-CM

## 2020-04-11 DIAGNOSIS — I2602 Saddle embolus of pulmonary artery with acute cor pulmonale: Principal | ICD-10-CM | POA: Diagnosis present

## 2020-04-11 DIAGNOSIS — I5082 Biventricular heart failure: Secondary | ICD-10-CM | POA: Diagnosis present

## 2020-04-11 DIAGNOSIS — R4182 Altered mental status, unspecified: Secondary | ICD-10-CM | POA: Diagnosis present

## 2020-04-11 DIAGNOSIS — F32A Depression, unspecified: Secondary | ICD-10-CM | POA: Diagnosis present

## 2020-04-11 DIAGNOSIS — I110 Hypertensive heart disease with heart failure: Secondary | ICD-10-CM | POA: Diagnosis present

## 2020-04-11 DIAGNOSIS — K859 Acute pancreatitis without necrosis or infection, unspecified: Secondary | ICD-10-CM | POA: Diagnosis not present

## 2020-04-11 DIAGNOSIS — E43 Unspecified severe protein-calorie malnutrition: Secondary | ICD-10-CM | POA: Diagnosis not present

## 2020-04-11 DIAGNOSIS — E519 Thiamine deficiency, unspecified: Secondary | ICD-10-CM | POA: Diagnosis present

## 2020-04-11 DIAGNOSIS — R7303 Prediabetes: Secondary | ICD-10-CM | POA: Diagnosis present

## 2020-04-11 DIAGNOSIS — I509 Heart failure, unspecified: Secondary | ICD-10-CM

## 2020-04-11 DIAGNOSIS — Z86711 Personal history of pulmonary embolism: Secondary | ICD-10-CM

## 2020-04-11 DIAGNOSIS — M7989 Other specified soft tissue disorders: Secondary | ICD-10-CM | POA: Diagnosis present

## 2020-04-11 DIAGNOSIS — R1314 Dysphagia, pharyngoesophageal phase: Secondary | ICD-10-CM | POA: Diagnosis present

## 2020-04-11 DIAGNOSIS — R627 Adult failure to thrive: Secondary | ICD-10-CM | POA: Diagnosis present

## 2020-04-11 DIAGNOSIS — I82411 Acute embolism and thrombosis of right femoral vein: Secondary | ICD-10-CM | POA: Diagnosis present

## 2020-04-11 DIAGNOSIS — K635 Polyp of colon: Secondary | ICD-10-CM | POA: Diagnosis present

## 2020-04-11 DIAGNOSIS — N281 Cyst of kidney, acquired: Secondary | ICD-10-CM | POA: Diagnosis present

## 2020-04-11 DIAGNOSIS — Z809 Family history of malignant neoplasm, unspecified: Secondary | ICD-10-CM

## 2020-04-11 DIAGNOSIS — K2100 Gastro-esophageal reflux disease with esophagitis, without bleeding: Secondary | ICD-10-CM | POA: Diagnosis present

## 2020-04-11 DIAGNOSIS — Z6839 Body mass index (BMI) 39.0-39.9, adult: Secondary | ICD-10-CM

## 2020-04-11 DIAGNOSIS — I513 Intracardiac thrombosis, not elsewhere classified: Secondary | ICD-10-CM | POA: Diagnosis present

## 2020-04-11 DIAGNOSIS — R112 Nausea with vomiting, unspecified: Secondary | ICD-10-CM | POA: Diagnosis present

## 2020-04-11 DIAGNOSIS — D62 Acute posthemorrhagic anemia: Secondary | ICD-10-CM | POA: Diagnosis not present

## 2020-04-11 DIAGNOSIS — G4733 Obstructive sleep apnea (adult) (pediatric): Secondary | ICD-10-CM | POA: Diagnosis present

## 2020-04-11 DIAGNOSIS — R634 Abnormal weight loss: Secondary | ICD-10-CM

## 2020-04-11 DIAGNOSIS — T4145XA Adverse effect of unspecified anesthetic, initial encounter: Secondary | ICD-10-CM

## 2020-04-11 DIAGNOSIS — I4891 Unspecified atrial fibrillation: Secondary | ICD-10-CM | POA: Diagnosis present

## 2020-04-11 DIAGNOSIS — I2699 Other pulmonary embolism without acute cor pulmonale: Secondary | ICD-10-CM

## 2020-04-11 DIAGNOSIS — R7989 Other specified abnormal findings of blood chemistry: Secondary | ICD-10-CM | POA: Diagnosis present

## 2020-04-11 DIAGNOSIS — Z79899 Other long term (current) drug therapy: Secondary | ICD-10-CM

## 2020-04-11 DIAGNOSIS — F419 Anxiety disorder, unspecified: Secondary | ICD-10-CM | POA: Diagnosis present

## 2020-04-11 DIAGNOSIS — Z8249 Family history of ischemic heart disease and other diseases of the circulatory system: Secondary | ICD-10-CM

## 2020-04-11 DIAGNOSIS — I081 Rheumatic disorders of both mitral and tricuspid valves: Secondary | ICD-10-CM | POA: Diagnosis present

## 2020-04-11 DIAGNOSIS — K810 Acute cholecystitis: Secondary | ICD-10-CM | POA: Diagnosis present

## 2020-04-11 DIAGNOSIS — E876 Hypokalemia: Secondary | ICD-10-CM | POA: Diagnosis present

## 2020-04-11 DIAGNOSIS — K317 Polyp of stomach and duodenum: Secondary | ICD-10-CM | POA: Diagnosis present

## 2020-04-11 DIAGNOSIS — R945 Abnormal results of liver function studies: Secondary | ICD-10-CM | POA: Diagnosis present

## 2020-04-11 DIAGNOSIS — Z86718 Personal history of other venous thrombosis and embolism: Secondary | ICD-10-CM

## 2020-04-11 DIAGNOSIS — K760 Fatty (change of) liver, not elsewhere classified: Secondary | ICD-10-CM | POA: Diagnosis present

## 2020-04-11 DIAGNOSIS — I10 Essential (primary) hypertension: Secondary | ICD-10-CM | POA: Diagnosis present

## 2020-04-11 LAB — COMPREHENSIVE METABOLIC PANEL
ALT: 134 U/L — ABNORMAL HIGH (ref 0–44)
AST: 120 U/L — ABNORMAL HIGH (ref 15–41)
Albumin: 2.9 g/dL — ABNORMAL LOW (ref 3.5–5.0)
Alkaline Phosphatase: 64 U/L (ref 38–126)
Anion gap: 12 (ref 5–15)
BUN: 24 mg/dL — ABNORMAL HIGH (ref 6–20)
CO2: 21 mmol/L — ABNORMAL LOW (ref 22–32)
Calcium: 8.6 mg/dL — ABNORMAL LOW (ref 8.9–10.3)
Chloride: 102 mmol/L (ref 98–111)
Creatinine, Ser: 1.12 mg/dL (ref 0.61–1.24)
GFR, Estimated: >60 mL/min (ref >60)
Glucose, Bld: 125 mg/dL — ABNORMAL HIGH (ref 70–99)
Potassium: 4.5 mmol/L (ref 3.5–5.1)
Sodium: 135 mmol/L (ref 135–145)
Total Bilirubin: 3.9 mg/dL — ABNORMAL HIGH (ref 0.3–1.2)
Total Protein: 6.8 g/dL (ref 6.5–8.1)

## 2020-04-11 LAB — CBC WITH DIFFERENTIAL/PLATELET
Abs Immature Granulocytes: 0.02 10*3/uL (ref 0.00–0.07)
Basophils Absolute: 0 10*3/uL (ref 0.0–0.1)
Basophils Relative: 0 %
Eosinophils Absolute: 0 10*3/uL (ref 0.0–0.5)
Eosinophils Relative: 0 %
HCT: 50.2 % (ref 39.0–52.0)
Hemoglobin: 16.2 g/dL (ref 13.0–17.0)
Immature Granulocytes: 0 %
Lymphocytes Relative: 17 %
Lymphs Abs: 1.1 10*3/uL (ref 0.7–4.0)
MCH: 25.6 pg — ABNORMAL LOW (ref 26.0–34.0)
MCHC: 32.3 g/dL (ref 30.0–36.0)
MCV: 79.4 fL — ABNORMAL LOW (ref 80.0–100.0)
Monocytes Absolute: 0.8 10*3/uL (ref 0.1–1.0)
Monocytes Relative: 12 %
Neutro Abs: 4.9 10*3/uL (ref 1.7–7.7)
Neutrophils Relative %: 71 %
Platelets: 146 10*3/uL — ABNORMAL LOW (ref 150–400)
RBC: 6.32 MIL/uL — ABNORMAL HIGH (ref 4.22–5.81)
RDW: 17.6 % — ABNORMAL HIGH (ref 11.5–15.5)
WBC: 6.9 10*3/uL (ref 4.0–10.5)
nRBC: 0 % (ref 0.0–0.2)

## 2020-04-11 LAB — VITAMIN B12: Vitamin B-12: 702 pg/mL (ref 180–914)

## 2020-04-11 LAB — RESP PANEL BY RT-PCR (FLU A&B, COVID) ARPGX2
Influenza A by PCR: NEGATIVE
Influenza B by PCR: NEGATIVE
SARS Coronavirus 2 by RT PCR: NEGATIVE

## 2020-04-11 LAB — HEMOGLOBIN AND HEMATOCRIT, BLOOD
HCT: 51.2 % (ref 39.0–52.0)
Hemoglobin: 16.2 g/dL (ref 13.0–17.0)

## 2020-04-11 LAB — TROPONIN I (HIGH SENSITIVITY)
Troponin I (High Sensitivity): 42 ng/L — ABNORMAL HIGH (ref <18)
Troponin I (High Sensitivity): 48 ng/L — ABNORMAL HIGH (ref <18)
Troponin I (High Sensitivity): 38 ng/L — ABNORMAL HIGH (ref <18)

## 2020-04-11 LAB — LIPASE, BLOOD
Lipase: 107 U/L — ABNORMAL HIGH (ref 11–51)
Lipase: 135 U/L — ABNORMAL HIGH (ref 11–51)

## 2020-04-11 LAB — LACTIC ACID, PLASMA
Lactic Acid, Venous: 2.2 mmol/L (ref 0.5–1.9)
Lactic Acid, Venous: 2.6 mmol/L (ref 0.5–1.9)

## 2020-04-11 LAB — MAGNESIUM: Magnesium: 2 mg/dL (ref 1.7–2.4)

## 2020-04-11 IMAGING — US US ABDOMEN LIMITED RUQ/ASCITES
1 series · 14 of 25 positions shown · non-contrast
Comparison: CT abdomen and pelvis [DATE]

CLINICAL DATA: Pain with nausea and hemoptysis

EXAM:
ULTRASOUND ABDOMEN LIMITED RIGHT UPPER QUADRANT

[Series 1: us abdomen limited ruq/ascites · 14 of 44 slices shown]
[im 1/44]
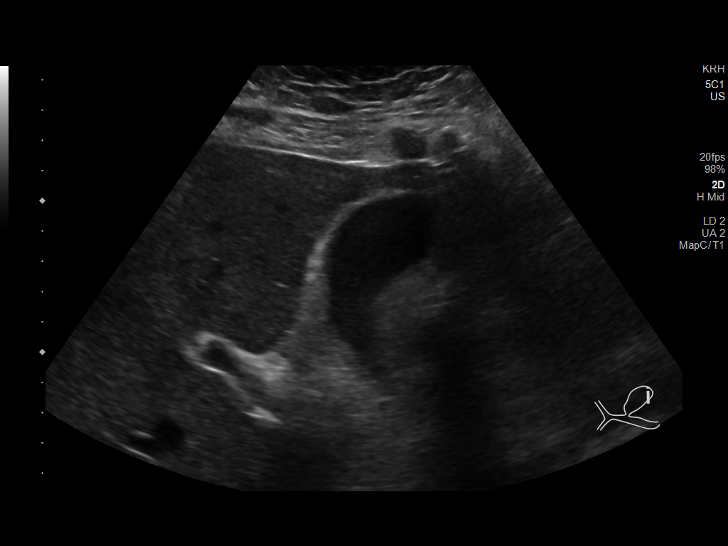
[im 4/44]
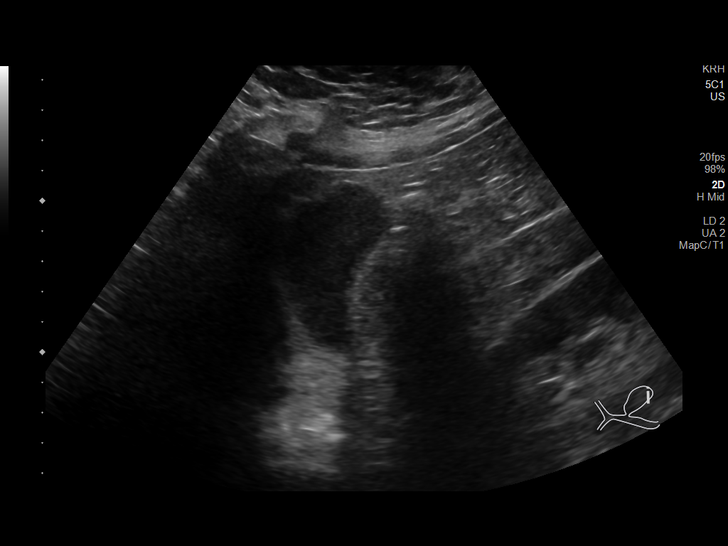
[im 8/44]
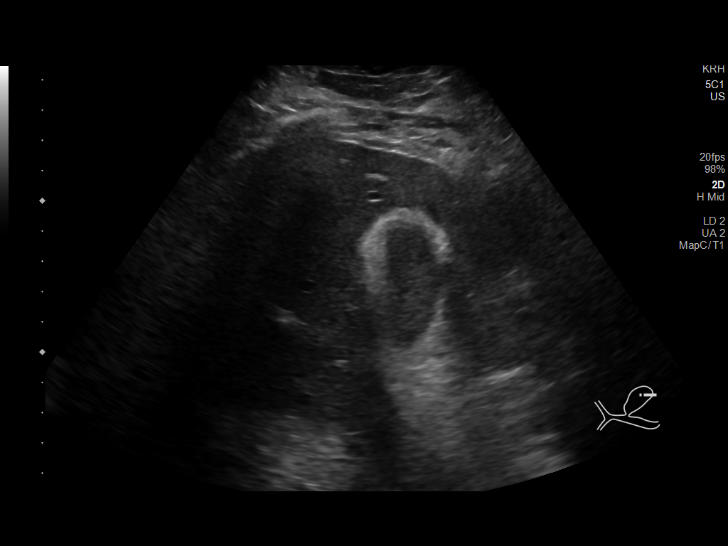
[im 11/44]
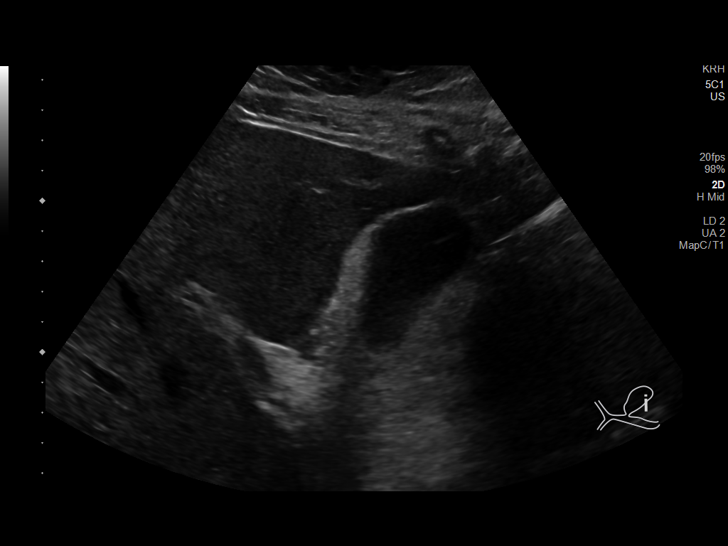
[im 15/44]
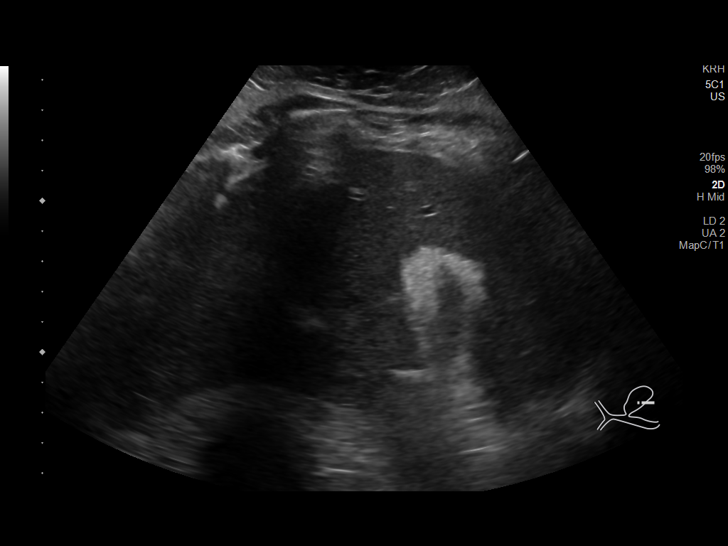
[im 17/44]
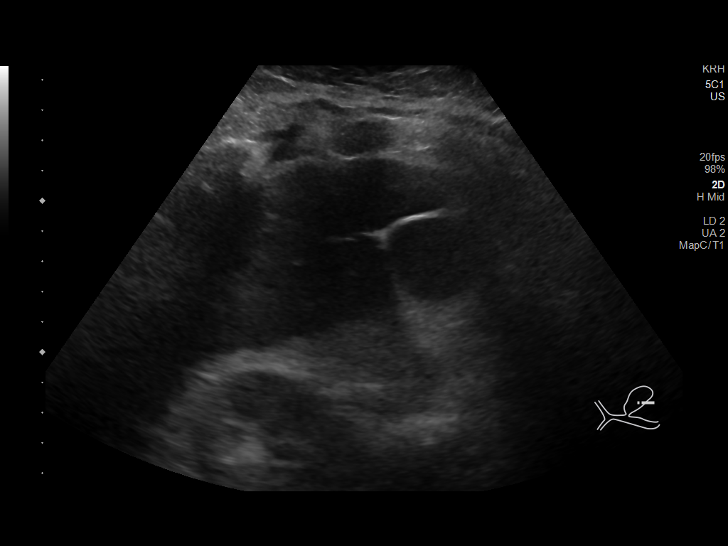
[im 20/44]
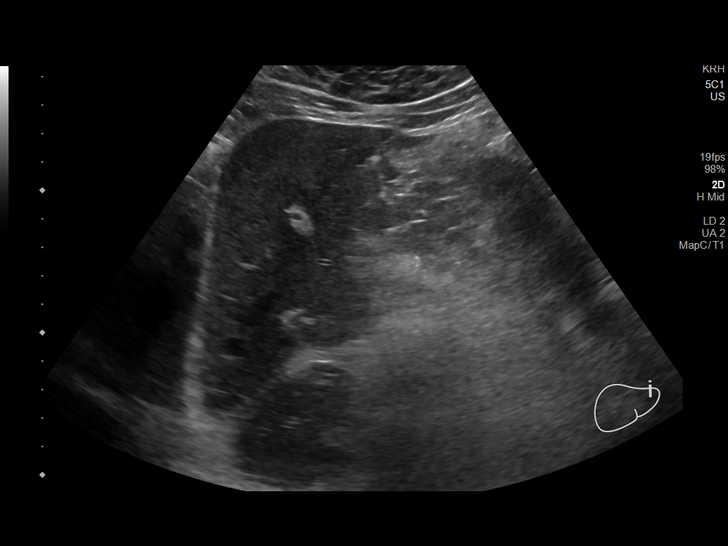
[im 24/44]
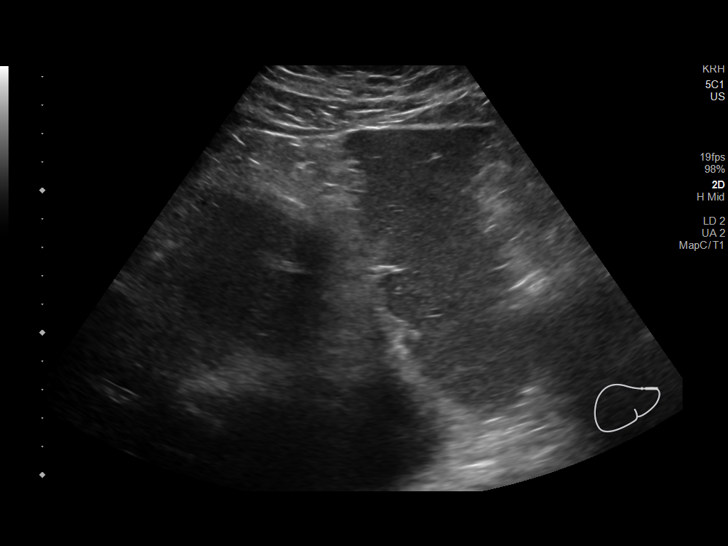
[im 27/44]
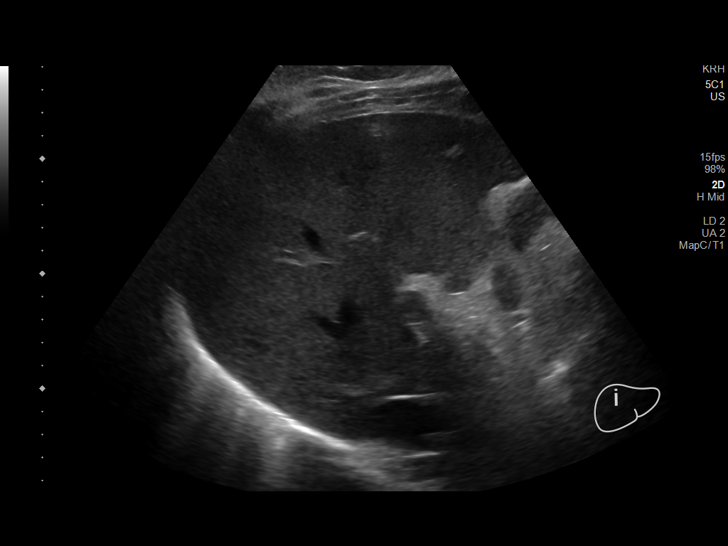
[im 29/44]
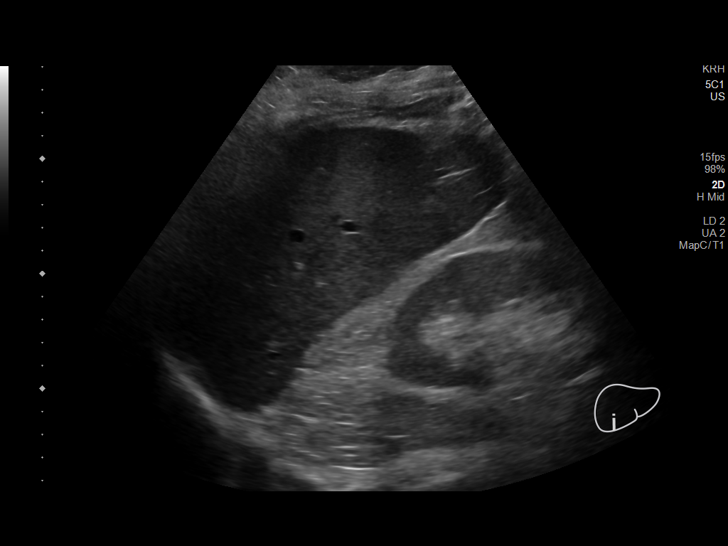
[im 33/44]
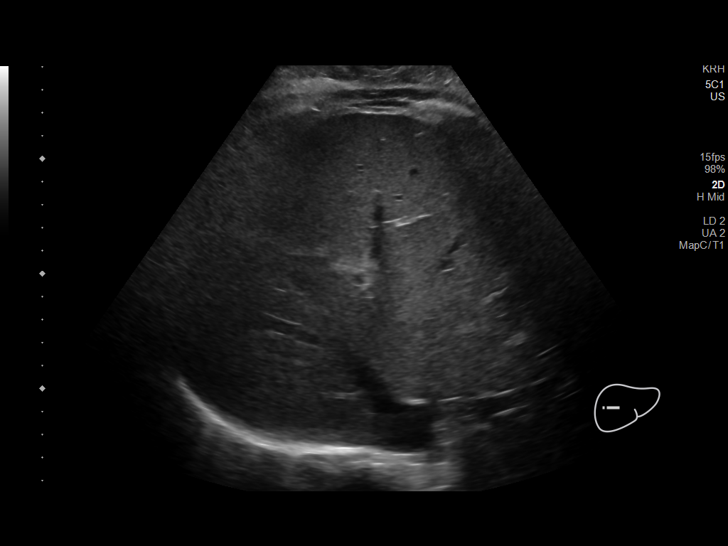
[im 36/44]
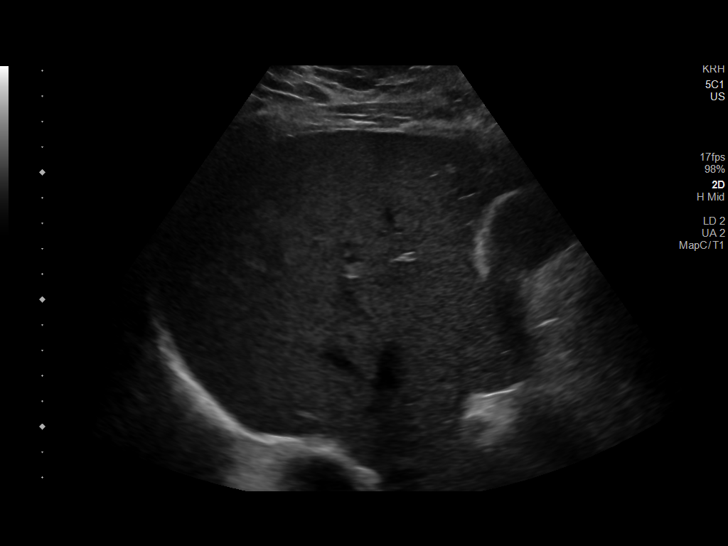
[im 40/44]
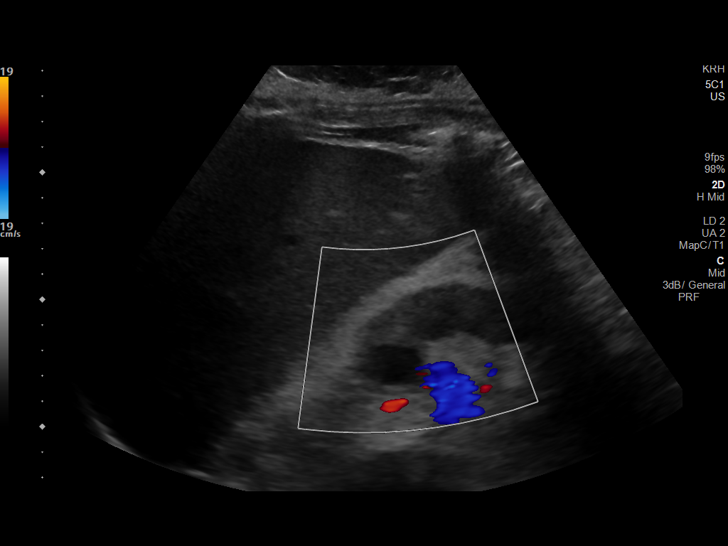
[im 44/44]
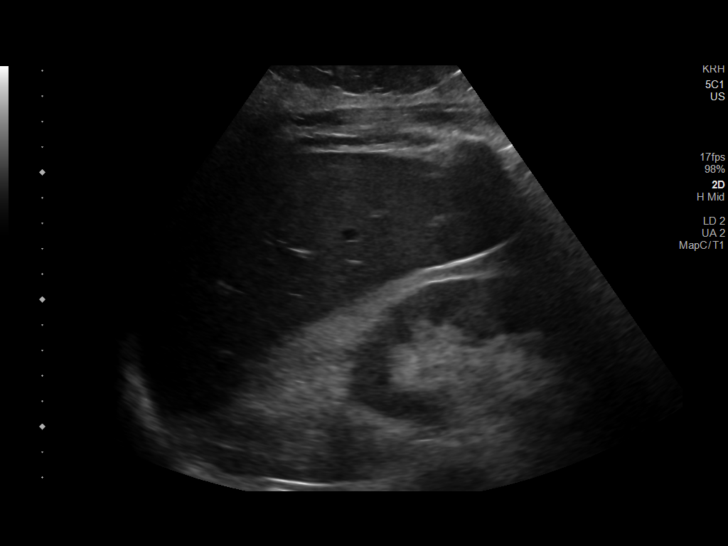

[14 of 25 positions shown; findings below may reference images not displayed]

FINDINGS: Gallbladder:

There is sludge in the gallbladder. No gallstones are evident.
Gallbladder wall is thickened. There is no appreciable
pericholecystic fluid. No sonographic Murphy sign noted by
sonographer.

Common bile duct:

Diameter: 4 mm. No intrahepatic or extrahepatic biliary duct
dilatation evident.

Liver:

No focal lesion identified. Within normal limits in parenchymal
echogenicity. Portal vein is patent on color Doppler imaging with
normal direction of blood flow towards the liver.

Other: There is a cyst in the upper pole the right kidney measuring
1.9 x 1.7 x 1.5 cm.
IMPRESSION: 1. No gallstones evident. There is sludge in the gallbladder with
gallbladder wall thickening. Question a degree of acalculus
cholecystitis. This finding may warrant nuclear medicine
hepatobiliary imaging study to assess for cystic duct patency.

2. Cyst arising from upper pole right kidney measuring 1.9 x 1.7 x
1.5 cm.

3.  Study otherwise unremarkable.

## 2020-04-11 MED ORDER — ACETAMINOPHEN 325 MG PO TABS: 650.0000 mg | ORAL_TABLET | Freq: Four times a day (QID) | ORAL | Status: DC | PRN

## 2020-04-11 MED ORDER — DICYCLOMINE HCL 20 MG PO TABS
20.0000 mg | ORAL_TABLET | Freq: Four times a day (QID) | ORAL | Status: DC | PRN
  Filled 2020-04-11: qty 1

## 2020-04-11 MED ORDER — ONDANSETRON HCL 4 MG PO TABS: 4.0000 mg | ORAL_TABLET | Freq: Four times a day (QID) | ORAL | Status: DC | PRN

## 2020-04-11 MED ORDER — ONDANSETRON HCL 4 MG/2ML IJ SOLN
4.0000 mg | Freq: Once | INTRAMUSCULAR | Status: AC
  Administered 2020-04-11: 4 mg via INTRAVENOUS
  Filled 2020-04-11: qty 2

## 2020-04-11 MED ORDER — PROMETHAZINE HCL 25 MG/ML IJ SOLN: 6.2500 mg | Freq: Four times a day (QID) | INTRAMUSCULAR | Status: DC | PRN

## 2020-04-11 MED ORDER — BUPROPION HCL 100 MG PO TABS
100.0000 mg | ORAL_TABLET | Freq: Two times a day (BID) | ORAL | Status: DC
  Administered 2020-04-11 – 2020-04-14 (×4): 100 mg via ORAL
  Filled 2020-04-11 (×18): qty 1

## 2020-04-11 MED ORDER — HYDRALAZINE HCL 20 MG/ML IJ SOLN
5.0000 mg | Freq: Four times a day (QID) | INTRAMUSCULAR | Status: DC | PRN
  Filled 2020-04-11: qty 1

## 2020-04-11 MED ORDER — ACETAMINOPHEN 650 MG RE SUPP: 650.0000 mg | Freq: Four times a day (QID) | RECTAL | Status: DC | PRN

## 2020-04-11 MED ORDER — METOCLOPRAMIDE HCL 5 MG/ML IJ SOLN
10.0000 mg | Freq: Once | INTRAMUSCULAR | Status: AC
  Administered 2020-04-11: 10 mg via INTRAVENOUS
  Filled 2020-04-11: qty 2

## 2020-04-11 MED ORDER — LACTATED RINGERS IV SOLN: INTRAVENOUS | Status: DC

## 2020-04-11 MED ORDER — ONDANSETRON HCL 4 MG/2ML IJ SOLN
4.0000 mg | Freq: Four times a day (QID) | INTRAMUSCULAR | Status: DC | PRN
  Administered 2020-04-13 – 2020-04-17 (×5): 4 mg via INTRAVENOUS
  Filled 2020-04-11 (×5): qty 2

## 2020-04-11 MED ORDER — SODIUM CHLORIDE 0.9 % IV BOLUS
500.0000 mL | Freq: Once | INTRAVENOUS | Status: AC
  Administered 2020-04-11: 500 mL via INTRAVENOUS

## 2020-04-11 MED ORDER — TRAZODONE HCL 50 MG PO TABS
50.0000 mg | ORAL_TABLET | Freq: Once | ORAL | Status: AC
  Administered 2020-04-11: 50 mg via ORAL
  Filled 2020-04-11: qty 1

## 2020-04-11 MED ORDER — ALLOPURINOL 300 MG PO TABS
300.0000 mg | ORAL_TABLET | Freq: Every day | ORAL | Status: DC
  Administered 2020-04-12 – 2020-04-24 (×12): 300 mg via ORAL
  Filled 2020-04-11 (×12): qty 1

## 2020-04-11 MED ORDER — SODIUM CHLORIDE 0.9 % IV BOLUS
1000.0000 mL | Freq: Once | INTRAVENOUS | Status: AC
  Administered 2020-04-11: 1000 mL via INTRAVENOUS

## 2020-04-11 MED ORDER — PANTOPRAZOLE SODIUM 40 MG IV SOLR
40.0000 mg | Freq: Two times a day (BID) | INTRAVENOUS | Status: DC
  Administered 2020-04-11 – 2020-04-16 (×10): 40 mg via INTRAVENOUS
  Filled 2020-04-11 (×10): qty 40

## 2020-04-11 MED ORDER — SUCRALFATE 1 G PO TABS
1.0000 g | ORAL_TABLET | Freq: Three times a day (TID) | ORAL | Status: DC
  Administered 2020-04-11 – 2020-04-17 (×8): 1 g via ORAL
  Filled 2020-04-11 (×15): qty 1

## 2020-04-11 MED ORDER — METOCLOPRAMIDE HCL 5 MG/ML IJ SOLN
5.0000 mg | Freq: Four times a day (QID) | INTRAMUSCULAR | Status: DC
  Administered 2020-04-11 – 2020-04-13 (×8): 5 mg via INTRAVENOUS
  Filled 2020-04-11 (×8): qty 2

## 2020-04-11 NOTE — Progress Notes (Signed)
Date and time results received: 04/11/20 2255  Test: Lactic acid Critical Value: 2.6  Name of Provider Notified: [facility]  Orders Received? Yes, Bolus just recently given. Lactic drawn very close to previous value.

## 2020-04-11 NOTE — H&P (Signed)
History and Physical    Kevin Baxter ZGY:[id number] DOB: 1970-11-08 DOA: 04/11/2020  PCP: Colon Branch, MD   Patient coming from: [facility]  Chief Complaint  Patient presents with  . Nausea      HPI: Kevin Baxter is a 50 y.o. male with medical history significant for hypertension, gout, anxiety/depression, morbid obesity, GERD, history of polyposis of his stomach and colonic polyps, reported esophageal dysphagia who has had abdominal pain nausea and vomiting going on for several weeks Recent admission from 1/27-1/30 for similar complaint and treated for acute pancreatitis-with lipase at 74 slightly elevated troponin and CT scan abdomen suggestive of acute pancreatitis and pancreatic tail. Patient was having further work-up underwent EGD 04/09/20 which did not reveal cause of the dysphagia, but following EGD patient had episode of altered mental status and strokelike symptoms and had extensive evaluation in the ED with negative MRI brain and discharged home. Since discharge she has been having intractable nausea vomiting and spitting blood and not tolerating diet- On-call GI was called by patient's wife around 1 AM to report of his symptoms and advised to come to the ED. In the ED patient was tachycardic blood work showed mildly with AST ALT, elevated lipase slightly positive troponin ultrasound abdomen showed gallbladder sludge with wall thickening but no gallstone.  Patient was not symptomatic and subsequently [facility] called for admission and brought to [facility]. Patient was seen in room 1607 on my evaluation is alert awake oriented x3 Wife is at the bedside She endorses that ever since the endoscopy he is sometimes confused, has lost weight has not eaten of any food, has had dry heaving nausea vomiting and diarrhea. Patient otherwise denies fever cough, chest pain back pain flank pain dysuria, focal weakness double vision .  Review of Systems: All systems were  reviewed and were negative except as mentioned in HPI above. Negative for fever Negative for chest pain Negative for shortness of breath  Past Medical History:  Diagnosis Date  . Allergy   . Anxiety   . Arthritis   . Fundic gland polyposis of stomach   . GERD with esophagitis   . Gout   . Hx of adenomatous colonic polyps   . Hypertension     Past Surgical History:  Procedure Laterality Date  . COLONOSCOPY  05/2019  . ESOPHAGOGASTRODUODENOSCOPY  05/2019  . HAND SURGERY Right    2006 for a FX     reports that he has never smoked. He has never used smokeless tobacco. He reports current alcohol use. He reports that he does not use drugs.  Allergies  Allergen Reactions  . Pseudoephedrine     REACTION: jittery    Family History  Problem Relation Age of Onset  . Cancer Mother        CERVICAL  . Hypertension Father   . Heart failure Father        no MI, d/t etoh  . Diabetes Sister   . Diabetes Sister   . Diabetes Paternal Aunt   . Diabetes Cousin   . Colon cancer Neg Hx   . Prostate cancer Neg Hx   . Stroke Neg Hx   . Esophageal cancer Neg Hx   . Rectal cancer Neg Hx   . Stomach cancer Neg Hx      Prior to Admission medications   Medication Sig Start Date End Date Taking? Authorizing Provider  allopurinol (ZYLOPRIM) 300 MG tablet Take 1 tablet (  300 mg total) by mouth daily. 12/22/19   Colon Branch, MD  buPROPion [facility]) 100 MG tablet Take 1 tablet a day x 1 week, then 1 tab BID 03/02/20   Colon Branch, MD  dicyclomine (BENTYL) 20 MG tablet Take 1 tablet (20 mg total) by mouth every 6 (six) hours as needed for spasms. 04/22/19   Gatha Mayer, MD  losartan (COZAAR) 50 MG tablet Take 2 tablets (100 mg total) by mouth daily. 04/08/20   Colon Branch, MD  ondansetron (ZOFRAN ODT) 4 MG disintegrating tablet Take 1 tablet (4 mg total) by mouth every 8 (eight) hours as needed for nausea or vomiting. 04/09/20   Carmin Muskrat, MD  pantoprazole (PROTONIX) 40 MG tablet Take  1 tablet (40 mg total) by mouth daily. 03/22/20   Shelda Pal, DO  sucralfate (CARAFATE) 1 g tablet Take 1 tablet (1 g total) by mouth 4 (four) times daily -  with meals and at bedtime. 04/09/20   Carmin Muskrat, MD    Physical Exam: Vitals:   04/11/20 1500 04/11/20 1537 04/11/20 1600 04/11/20 1700  BP: (!) 119/96 (!) 116/103 (!) 124/97 (!) 130/104  Pulse: (!) 113 (!) 115 (!) 112 100  Resp: 16 (!) 22 (!) 33 19  Temp:    97.7 F (36.5 C)  TempSrc: Oral   Oral  SpO2: 100% 99% 92% 98%  Weight:      Height:       General exam: AAOx3,NAD, weak appearing. HEENT:Oral mucosa dry, Ear/Nose WNL grossly, dentition normal. Respiratory system: bilaterally diminished ,no wheezing or crackles,no use of accessory muscle Cardiovascular system: S1 & S2 +, No JVD,. Gastrointestinal system: Abdomen soft, midepigastric area tender to palpation no right upper quadrant tenderness no guarding rigidity , obese abdomen, BS+ Nervous System:Alert, awake, moving extremities and grossly nonfocal Extremities: No edema, distal peripheral pulses palpable.  Skin: No rashes,no icterus. MSK: Normal muscle bulk,tone, power   Labs on Admission: I have personally reviewed following labs and imaging studies  CBC: Recent Labs  Lab 04/09/20 1301 04/11/20 1140  WBC 6.9 6.9  NEUTROABS 5.6 4.9  HGB 16.0 16.2  HCT 50.8 50.2  MCV 80.5 79.4*  PLT 175 295*   Basic Metabolic Panel: Recent Labs  Lab 04/09/20 1301 04/11/20 1140  NA 138 135  K 4.7 4.5  CL 105 102  CO2 23 21*  GLUCOSE 119* 125*  BUN 18 24*  CREATININE 1.14 1.12  CALCIUM 8.3* 8.6*  MG 2.0 2.0   GFR: Estimated Creatinine Clearance: 112.6 mL/min (by C-G formula based on SCr of 1.12 mg/dL). Liver Function Tests: Recent Labs  Lab 04/09/20 1301 04/11/20 1140  AST 40 120*  ALT 69* 134*  ALKPHOS 61 64  BILITOT 3.3* 3.9*  PROT 6.3* 6.8  ALBUMIN 2.7* 2.9*   Recent Labs  Lab 04/11/20 1140  LIPASE 107*   No results for input(s):  AMMONIA in the last 168 hours. Coagulation Profile: No results for input(s): INR, PROTIME in the last 168 hours. Cardiac Enzymes: No results for input(s): CKTOTAL, CKMB, CKMBINDEX, TROPONINI in the last 168 hours. BNP (last 3 results) No results for input(s): PROBNP in the last 8760 hours. HbA1C: No results for input(s): HGBA1C in the last 72 hours. CBG: No results for input(s): GLUCAP in the last 168 hours. Lipid Profile: No results for input(s): CHOL, HDL, LDLCALC, TRIG, CHOLHDL, LDLDIRECT in the last 72 hours. Thyroid Function Tests: Recent Labs    04/09/20 1301  TSH 1.342  Anemia Panel: No results for input(s): VITAMINB12, FOLATE, FERRITIN, TIBC, IRON, RETICCTPCT in the last 72 hours. Urine analysis:    Component Value Date/Time   COLORURINE AMBER (A) 02/26/2020 0945   APPEARANCEUR CLEAR 02/26/2020 0945   LABSPEC 1.030 02/26/2020 0945   PHURINE 6.0 02/26/2020 0945   GLUCOSEU NEGATIVE 02/26/2020 0945   HGBUR MODERATE (A) 02/26/2020 0945   BILIRUBINUR SMALL (A) 02/26/2020 0945   KETONESUR 15 (A) 02/26/2020 0945   PROTEINUR NEGATIVE 02/26/2020 0945   NITRITE NEGATIVE 02/26/2020 0945   LEUKOCYTESUR NEGATIVE 02/26/2020 0945    Radiological Exams on Admission: US Abdomen Limited RUQ (LIVER/GB)  Result Date: 04/11/2020 CLINICAL DATA:  Pain with nausea and hemoptysis EXAM: ULTRASOUND ABDOMEN LIMITED RIGHT UPPER QUADRANT COMPARISON:  CT abdomen and pelvis April 08, 2020 FINDINGS: Gallbladder: There is sludge in the gallbladder. No gallstones are evident. Gallbladder wall is thickened. There is no appreciable pericholecystic fluid. No sonographic Murphy sign noted by sonographer. Common bile duct: Diameter: 4 mm. No intrahepatic or extrahepatic biliary duct dilatation evident. Liver: No focal lesion identified. Within normal limits in parenchymal echogenicity. Portal vein is patent on color Doppler imaging with normal direction of blood flow towards the liver. Other: There is a  cyst in the upper pole the right kidney measuring 1.9 x 1.7 x 1.5 cm. IMPRESSION: 1. No gallstones evident. There is sludge in the gallbladder with gallbladder wall thickening. Question a degree of acalculus cholecystitis. This finding may warrant nuclear medicine hepatobiliary imaging study to assess for cystic duct patency. 2. Cyst arising from upper pole right kidney measuring 1.9 x 1.7 x 1.5 cm. 3.  Study otherwise unremarkable. Electronically Signed   By: Lowella Grip Baxter M.D.   On: 04/11/2020 14:13    Assessment/Plan Intractable nausea and vomiting spitting of blood Dehydration 2/2 #1-with ketosis in the urine GERD Unclear etiology,Patient has been dealing with similar symptoms for some time, recent admission for similar symptoms managed as acute pancreatitis in January.  Recent EGD 3/11-unremarkable. Will admit the patient keep on clears, PPI twice daily IV fluids antinausea medication.  Check serial H&H. we will let GI physician know.Given gallbladder wall thickening will obtain HIDA scan-but he is not tender on right upper quadrant. Has elevated lipase unclear if it is pancreatitis versus due to nausea vomiting dehydration trend lipase check amylase and lactic acid. ??  Gastroparesis given chronicity of his problem but no history of diabetes.  Will check hemoglobin A1c.  Will hydrate with IV fluids.  Abnormal AST ALT and bilirubin: Ultrasound gallbladder reviewed.  Monitor LFTs, consult GI  Mildly elevated troponin no chest pain, sinus tachycardia on EKG nonischemic T wave changes.  Check serial troponin.  Similar presentation back in January.  Hypertension: Blood pressure stable holding losartan add as needed hydralazine.  Anxiety/depression resume Wellbutrin  History of gout.  Morbid obesity BMI 39 will benefit with weight loss and outpatient follow-up.  Episode of Altered mental status after EGD on 3/11:Wife endorses he has been intermittently confused currently is alert awake  oriented x3 neurologically intact.  Check B12 TSH and B1.Dehydration could contribute.   Body mass index is 39.32 kg/m.   Severity of Illness: The appropriate patient status for this patient is OBSERVATION. Observation status is judged to be reasonable and necessary in order to provide the required intensity of service to ensure the patient's safety. The patient's presenting symptoms, physical exam findings, and initial radiographic and laboratory data in the context of their medical condition is felt to place them  at decreased risk for further clinical deterioration. Furthermore, it is anticipated that the patient will be medically stable for discharge from the hospital within 2 midnights of admission.   DVT prophylaxis:SCD Code Status:   Code Status: Full Code  Family Communication: Admission, patients condition and plan of care including tests being ordered have been discussed with the patient and wife who indicate understanding and agree with the plan and Code Status.  Consults called:  GI  Antonieta Pert MD [facility]  If 7PM-7AM, please contact night-coverage [email or web address]  04/11/2020, 6:31 PM

## 2020-04-11 NOTE — ED Notes (Signed)
Report given to [place], [place]

## 2020-04-11 NOTE — Progress Notes (Signed)
Date and time results received: 04/11/20 at 2130  Test: Lactic acid  Critical Value: 2.2 ( also notified provider about elevated troponin 48 and lipase.)  Name of Provider Notified: [place]  Orders Received? Pending orders.

## 2020-04-11 NOTE — ED Triage Notes (Signed)
Pt arrives with c/o NVD for "months" pt reports he had a endoscopy on Friday pt reports he has been spitting up blood and unable to keep anything down, decreased PO intake.

## 2020-04-11 NOTE — ED Notes (Signed)
Verbal consent to transfer to [facility] via carelink obtained from [place].

## 2020-04-11 NOTE — ED Notes (Signed)
Patient transported to Ultrasound 

## 2020-04-11 NOTE — Progress Notes (Signed)
TR[facility] transfer acceptance note  Transferring facility: [facility] Transferring MD: Dr. Thamas Jaegers, EDP Accepted to: [facility], medical bed, observation status.  Summary:  50 year old male with medical history including but not limited to HTN, GERD, gout, reported esophageal dysphagia, recent EGD by [facility] 3/11 which did not reveal cause of the dysphagia, subsequently seen in ED for AMS and visual symptoms, extensively worked up including negative MRI brain and discharged home.  [facility] MD on call received a call at approximately 1 AM from wife indicating that patient was spitting blood, has been having vomiting since the procedure and not able to tolerate anything orally and he was advised to go to the ED for further evaluation.  Seen in ED for abdominal pain, intractable nausea and vomiting, ongoing for past several weeks off and on but worse in the last few days.  Tachycardic on arrival, given 2 L of IVF bolus with improvement in heart rate to 110s.  Mild AST and ALT elevation.  No focal abdominal tenderness.  Ultrasound abdomen shows gallbladder sludge and wall thickening but no gallstones.  Remains nauseous despite Zofran.  I briefly reviewed chart.  Accepted for observation and evaluation given persistent GI symptoms, elevated LFTs (AST 120, ALT 134), Lipase 107, Total Bili 3.9, abnormal abdominal ultrasound, tachycardia and dehydration.  Will need H&P and admit orders on arrival and consult [facility].  Vernell Leep, MD, [place], [facility]. [facility]  To contact the attending provider between 7A-7P or the covering provider during after hours 7P-7A, please log into the web site [email or web address] and access using universal [place] password for that web site. If you do not have the password, please call the hospital operator.

## 2020-04-11 NOTE — Telephone Encounter (Signed)
Oncall Note:  Patient's wife called early this AM at  27, he is spitting up blood. He has been having vomiting since the procedure, is not able to tolerate any PO intake.  Advise to bring patient to [facility] for further evaluation and management.  She agreed with the plan and demonstrated an understanding of the instructions.  Damaris Hippo , MD [phone]

## 2020-04-11 NOTE — ED Notes (Signed)
PO fluids given

## 2020-04-11 NOTE — ED Provider Notes (Signed)
[facility] EMERGENCY DEPARTMENT Provider Note   CSN: [id number] Arrival date & time: 04/11/20  1115     History Chief Complaint  Patient presents with  . Abdominal Pain    Kevin Baxter is a 50 y.o. male.  Patient presents with complaint of abdominal pain, nausea and vomiting.  Symptoms been ongoing for the past several weeks off and on worse in the last few days.  He ultimately had gastroenterology perform endoscopy which was done 2 days ago had some polyps removed.  Subsequently had difficult time waking back up and returning to normal mental status and was admitted for MRI imaging and further evaluation before being discharged again.  Upon return home patient states he still having intermittent episodes of nausea vomiting, unable to keep down any fluids, feeling dehydrated.  Complaining of diffuse abdominal pain nonfocal and persistent.  Denies fevers or cough or headache or chest pain.        Past Medical History:  Diagnosis Date  . Allergy   . Anxiety   . Arthritis   . Fundic gland polyposis of stomach   . GERD with esophagitis   . Gout   . Hx of adenomatous colonic polyps   . Hypertension     Patient Active Problem List   Diagnosis Date Noted  . Loss of weight 04/06/2020  . Esophageal dysphagia 04/06/2020  . Abnormal LFTs 04/06/2020  . Depression, major, single episode, moderate ([place]) 03/02/2020  . Pancreatitis, acute 02/26/2020  . Acute pancreatitis 02/26/2020  . Hx of adenomatous colonic polyps   . Morbid obesity ([place]) 04/22/2015  . PCP NOTES >>>>>>>>>>>>>>>>>>>>>>>>> 04/22/2015  . Hyperglycemia 03/11/2014  . Erectile dysfunction 07/21/2011  . Annual physical exam 01/05/2011  . Gout 03/14/2006  . Essential hypertension 03/14/2006  . ALLERGIC RHINITIS 03/14/2006    Past Surgical History:  Procedure Laterality Date  . COLONOSCOPY  05/2019  . ESOPHAGOGASTRODUODENOSCOPY  05/2019  . HAND SURGERY Right    2006 for a FX       Family  History  Problem Relation Age of Onset  . Cancer Mother        CERVICAL  . Hypertension Father   . Heart failure Father        no MI, d/t etoh  . Diabetes Sister   . Diabetes Sister   . Diabetes Paternal Aunt   . Diabetes Cousin   . Colon cancer Neg Hx   . Prostate cancer Neg Hx   . Stroke Neg Hx   . Esophageal cancer Neg Hx   . Rectal cancer Neg Hx   . Stomach cancer Neg Hx     Social History   Tobacco Use  . Smoking status: Never Smoker  . Smokeless tobacco: Never Used  Vaping Use  . Vaping Use: Never used  Substance Use Topics  . Alcohol use: Yes    Comment: very rare   . Drug use: No    Home Medications Prior to Admission medications   Medication Sig Start Date End Date Taking? Authorizing Provider  allopurinol (ZYLOPRIM) 300 MG tablet Take 1 tablet (300 mg total) by mouth daily. 12/22/19   Colon Branch, MD  buPROPion [facility]) 100 MG tablet Take 1 tablet a day x 1 week, then 1 tab BID 03/02/20   Colon Branch, MD  dicyclomine (BENTYL) 20 MG tablet Take 1 tablet (20 mg total) by mouth every 6 (six) hours as needed for spasms. 04/22/19   Gatha Mayer, MD  losartan (COZAAR) 50 MG tablet Take 2 tablets (100 mg total) by mouth daily. 04/08/20   Colon Branch, MD  ondansetron (ZOFRAN ODT) 4 MG disintegrating tablet Take 1 tablet (4 mg total) by mouth every 8 (eight) hours as needed for nausea or vomiting. 04/09/20   Carmin Muskrat, MD  pantoprazole (PROTONIX) 40 MG tablet Take 1 tablet (40 mg total) by mouth daily. 03/22/20   Shelda Pal, DO  sucralfate (CARAFATE) 1 g tablet Take 1 tablet (1 g total) by mouth 4 (four) times daily -  with meals and at bedtime. 04/09/20   Carmin Muskrat, MD    Allergies    Pseudoephedrine  Review of Systems   Review of Systems  Constitutional: Negative for fever.  HENT: Negative for ear pain and sore throat.   Eyes: Negative for pain.  Respiratory: Negative for cough.   Cardiovascular: Negative for chest pain.   Gastrointestinal: Positive for abdominal pain.  Genitourinary: Negative for flank pain.  Musculoskeletal: Negative for back pain.  Skin: Negative for color change and rash.  Neurological: Negative for syncope.  All other systems reviewed and are negative.   Physical Exam Updated Vital Signs BP (!) 132/105   Pulse (!) 114   Temp 97.7 F (36.5 C) (Oral)   Resp (!) 23   Ht 6\' 1"  (1.854 m)   Wt 135.2 kg   SpO2 97%   BMI 39.32 kg/m   Physical Exam Constitutional:      General: He is not in acute distress.    Appearance: He is well-developed.  HENT:     Head: Normocephalic.     Nose: Nose normal.  Eyes:     Extraocular Movements: Extraocular movements intact.  Cardiovascular:     Rate and Rhythm: Normal rate.  Pulmonary:     Effort: Pulmonary effort is normal.  Abdominal:     Comments: No significant abdominal tenderness elicited.  Generalized nausea elicited upon palpation of the abdomen but no specific tenderness noted.  Skin:    Coloration: Skin is not jaundiced.  Neurological:     Mental Status: He is alert and oriented to person, place, and time. Mental status is at baseline.     ED Results / Procedures / Treatments   Labs (all labs ordered are listed, but only abnormal results are displayed) Labs Reviewed  CBC WITH DIFFERENTIAL/PLATELET - Abnormal; Notable for the following components:      Result Value   RBC 6.32 (*)    MCV 79.4 (*)    MCH 25.6 (*)    RDW 17.6 (*)    Platelets 146 (*)    All other components within normal limits  COMPREHENSIVE METABOLIC PANEL - Abnormal; Notable for the following components:   CO2 21 (*)    Glucose, Bld 125 (*)    BUN 24 (*)    Calcium 8.6 (*)    Albumin 2.9 (*)    AST 120 (*)    ALT 134 (*)    Total Bilirubin 3.9 (*)    All other components within normal limits  TROPONIN I (HIGH SENSITIVITY) - Abnormal; Notable for the following components:   Troponin I (High Sensitivity) 42 (*)    All other components within  normal limits  RESP PANEL BY RT-PCR (FLU A&B, COVID) ARPGX2  MAGNESIUM  TROPONIN I (HIGH SENSITIVITY)    EKG EKG Interpretation  Date/Time:  Sunday April 11 2020 11:28:36 EDT Ventricular Rate:  120 PR Interval:    QRS Duration: 98 QT  Interval:  311 QTC Calculation: 440 R Axis:   -60 Text Interpretation: Sinus tachycardia with irregular rate Left atrial enlargement Left anterior fascicular block Consider anterior infarct Nonspecific T abnormalities, lateral leads Baseline wander in lead(s) V1 V2 V4 Confirmed by Thamas Jaegers ([id number]) on 04/11/2020 12:40:55 PM   Radiology MR Brain W and Wo Contrast  Result Date: 04/09/2020 CLINICAL DATA:  Altered mental status. EXAM: MRI HEAD WITHOUT AND WITH CONTRAST TECHNIQUE: Multiplanar, multiecho pulse sequences of the brain and surrounding structures were obtained without and with intravenous contrast. CONTRAST:  60mL GADAVIST GADOBUTROL 1 MMOL/ML IV SOLN COMPARISON:  None. FINDINGS: Brain: No acute infarction, hemorrhage, hydrocephalus, extra-axial collection or mass lesion. Normal white matter. Normal enhancement postcontrast administration Image quality degraded by mild motion. Vascular: Normal arterial flow voids. Skull and upper cervical spine: No focal skeletal lesion. Sinuses/Orbits: Paranasal sinuses clear.  Negative orbit Other: None IMPRESSION: Negative MRI of the head with contrast. Mild motion degrades image quality Electronically Signed   By: Franchot Gallo M.D.   On: 04/09/2020 16:42   US Abdomen Limited RUQ (LIVER/GB)  Result Date: 04/11/2020 CLINICAL DATA:  Pain with nausea and hemoptysis EXAM: ULTRASOUND ABDOMEN LIMITED RIGHT UPPER QUADRANT COMPARISON:  CT abdomen and pelvis April 08, 2020 FINDINGS: Gallbladder: There is sludge in the gallbladder. No gallstones are evident. Gallbladder wall is thickened. There is no appreciable pericholecystic fluid. No sonographic Murphy sign noted by sonographer. Common bile duct: Diameter: 4 mm. No  intrahepatic or extrahepatic biliary duct dilatation evident. Liver: No focal lesion identified. Within normal limits in parenchymal echogenicity. Portal vein is patent on color Doppler imaging with normal direction of blood flow towards the liver. Other: There is a cyst in the upper pole the right kidney measuring 1.9 x 1.7 x 1.5 cm. IMPRESSION: 1. No gallstones evident. There is sludge in the gallbladder with gallbladder wall thickening. Question a degree of acalculus cholecystitis. This finding may warrant nuclear medicine hepatobiliary imaging study to assess for cystic duct patency. 2. Cyst arising from upper pole right kidney measuring 1.9 x 1.7 x 1.5 cm. 3.  Study otherwise unremarkable. Electronically Signed   By: Lowella Grip Baxter M.D.   On: 04/11/2020 14:13    Procedures Procedures   Medications Ordered in ED Medications  sodium chloride 0.9 % bolus 1,000 mL (0 mLs Intravenous Stopped 04/11/20 1302)  ondansetron (ZOFRAN) injection 4 mg (4 mg Intravenous Given 04/11/20 1152)  sodium chloride 0.9 % bolus 1,000 mL (1,000 mLs Intravenous New Bag/Given 04/11/20 1312)    ED Course  I have reviewed the triage vital signs and the nursing notes.  Pertinent labs & imaging results that were available during my care of the patient were reviewed by me and considered in my medical decision making (see chart for details).    MDM Rules/Calculators/A&P                          Labs show continued elevated liver enzymes.  Patient is tachycardic 125 bpm upon arrival.  Patient given 2 L bolus of IV fluids and heart rate is improved to about 110 bpm.  He still remains nauseous despite given Zofran.  Ultrasound shows gallbladder sludge and wall thickening but no gallstones seen.  Given his persistent tachycardia dehydration persistent vomiting and elevated liver enzymes with abnormal u/s , will bring in for hospitalist team to evaluate.  Final Clinical Impression(s) / ED Diagnoses Final diagnoses:   Gallbladder colic    Rx /  DC Orders ED Discharge Orders    None       Luna Fuse, MD 04/11/20 [phone]

## 2020-04-12 ENCOUNTER — Inpatient Hospital Stay: Payer: Self-pay

## 2020-04-12 ENCOUNTER — Observation Stay (HOSPITAL_COMMUNITY): Payer: 59

## 2020-04-12 ENCOUNTER — Inpatient Hospital Stay (HOSPITAL_COMMUNITY): Payer: 59

## 2020-04-12 ENCOUNTER — Other Ambulatory Visit: Payer: Self-pay

## 2020-04-12 DIAGNOSIS — R7989 Other specified abnormal findings of blood chemistry: Secondary | ICD-10-CM | POA: Diagnosis not present

## 2020-04-12 DIAGNOSIS — R000 Tachycardia, unspecified: Secondary | ICD-10-CM | POA: Diagnosis not present

## 2020-04-12 DIAGNOSIS — K317 Polyp of stomach and duodenum: Secondary | ICD-10-CM

## 2020-04-12 DIAGNOSIS — E519 Thiamine deficiency, unspecified: Secondary | ICD-10-CM | POA: Diagnosis present

## 2020-04-12 DIAGNOSIS — J9601 Acute respiratory failure with hypoxia: Secondary | ICD-10-CM | POA: Diagnosis present

## 2020-04-12 DIAGNOSIS — I110 Hypertensive heart disease with heart failure: Secondary | ICD-10-CM | POA: Diagnosis present

## 2020-04-12 DIAGNOSIS — I5082 Biventricular heart failure: Secondary | ICD-10-CM | POA: Diagnosis not present

## 2020-04-12 DIAGNOSIS — R112 Nausea with vomiting, unspecified: Secondary | ICD-10-CM | POA: Diagnosis not present

## 2020-04-12 DIAGNOSIS — I2782 Chronic pulmonary embolism: Secondary | ICD-10-CM | POA: Diagnosis not present

## 2020-04-12 DIAGNOSIS — K859 Acute pancreatitis without necrosis or infection, unspecified: Secondary | ICD-10-CM | POA: Diagnosis present

## 2020-04-12 DIAGNOSIS — N179 Acute kidney failure, unspecified: Secondary | ICD-10-CM | POA: Diagnosis present

## 2020-04-12 DIAGNOSIS — R570 Cardiogenic shock: Secondary | ICD-10-CM | POA: Diagnosis present

## 2020-04-12 DIAGNOSIS — M7989 Other specified soft tissue disorders: Secondary | ICD-10-CM | POA: Diagnosis not present

## 2020-04-12 DIAGNOSIS — I10 Essential (primary) hypertension: Secondary | ICD-10-CM

## 2020-04-12 DIAGNOSIS — R042 Hemoptysis: Secondary | ICD-10-CM | POA: Diagnosis present

## 2020-04-12 DIAGNOSIS — E872 Acidosis: Secondary | ICD-10-CM | POA: Diagnosis present

## 2020-04-12 DIAGNOSIS — R9430 Abnormal result of cardiovascular function study, unspecified: Secondary | ICD-10-CM | POA: Diagnosis not present

## 2020-04-12 DIAGNOSIS — K810 Acute cholecystitis: Secondary | ICD-10-CM

## 2020-04-12 DIAGNOSIS — I5043 Acute on chronic combined systolic (congestive) and diastolic (congestive) heart failure: Secondary | ICD-10-CM | POA: Diagnosis not present

## 2020-04-12 DIAGNOSIS — I82411 Acute embolism and thrombosis of right femoral vein: Secondary | ICD-10-CM | POA: Diagnosis present

## 2020-04-12 DIAGNOSIS — R1314 Dysphagia, pharyngoesophageal phase: Secondary | ICD-10-CM | POA: Diagnosis present

## 2020-04-12 DIAGNOSIS — R945 Abnormal results of liver function studies: Secondary | ICD-10-CM

## 2020-04-12 DIAGNOSIS — Z6839 Body mass index (BMI) 39.0-39.9, adult: Secondary | ICD-10-CM | POA: Diagnosis not present

## 2020-04-12 DIAGNOSIS — I5021 Acute systolic (congestive) heart failure: Secondary | ICD-10-CM | POA: Diagnosis not present

## 2020-04-12 DIAGNOSIS — T4145XA Adverse effect of unspecified anesthetic, initial encounter: Secondary | ICD-10-CM

## 2020-04-12 DIAGNOSIS — I513 Intracardiac thrombosis, not elsewhere classified: Secondary | ICD-10-CM | POA: Diagnosis not present

## 2020-04-12 DIAGNOSIS — K920 Hematemesis: Secondary | ICD-10-CM

## 2020-04-12 DIAGNOSIS — I2602 Saddle embolus of pulmonary artery with acute cor pulmonale: Secondary | ICD-10-CM | POA: Diagnosis present

## 2020-04-12 DIAGNOSIS — Z20822 Contact with and (suspected) exposure to covid-19: Secondary | ICD-10-CM | POA: Diagnosis present

## 2020-04-12 DIAGNOSIS — D62 Acute posthemorrhagic anemia: Secondary | ICD-10-CM | POA: Diagnosis not present

## 2020-04-12 DIAGNOSIS — R2241 Localized swelling, mass and lump, right lower limb: Secondary | ICD-10-CM

## 2020-04-12 DIAGNOSIS — I420 Dilated cardiomyopathy: Secondary | ICD-10-CM

## 2020-04-12 DIAGNOSIS — M109 Gout, unspecified: Secondary | ICD-10-CM | POA: Diagnosis present

## 2020-04-12 DIAGNOSIS — E43 Unspecified severe protein-calorie malnutrition: Secondary | ICD-10-CM | POA: Diagnosis present

## 2020-04-12 DIAGNOSIS — D75839 Thrombocytosis, unspecified: Secondary | ICD-10-CM

## 2020-04-12 DIAGNOSIS — Z8719 Personal history of other diseases of the digestive system: Secondary | ICD-10-CM | POA: Diagnosis not present

## 2020-04-12 DIAGNOSIS — K3184 Gastroparesis: Secondary | ICD-10-CM | POA: Diagnosis present

## 2020-04-12 DIAGNOSIS — T4145XD Adverse effect of unspecified anesthetic, subsequent encounter: Secondary | ICD-10-CM | POA: Diagnosis not present

## 2020-04-12 DIAGNOSIS — I428 Other cardiomyopathies: Secondary | ICD-10-CM | POA: Diagnosis present

## 2020-04-12 DIAGNOSIS — I5023 Acute on chronic systolic (congestive) heart failure: Secondary | ICD-10-CM | POA: Diagnosis present

## 2020-04-12 DIAGNOSIS — K812 Acute cholecystitis with chronic cholecystitis: Secondary | ICD-10-CM | POA: Diagnosis present

## 2020-04-12 DIAGNOSIS — R4182 Altered mental status, unspecified: Secondary | ICD-10-CM | POA: Diagnosis present

## 2020-04-12 DIAGNOSIS — K2100 Gastro-esophageal reflux disease with esophagitis, without bleeding: Secondary | ICD-10-CM | POA: Diagnosis present

## 2020-04-12 LAB — HEMOGLOBIN AND HEMATOCRIT, BLOOD
HCT: 51.4 % (ref 39.0–52.0)
HCT: 50.4 % (ref 39.0–52.0)
HCT: 50.8 % (ref 39.0–52.0)
Hemoglobin: 16 g/dL (ref 13.0–17.0)
Hemoglobin: 15.8 g/dL (ref 13.0–17.0)
Hemoglobin: 16.3 g/dL (ref 13.0–17.0)

## 2020-04-12 LAB — COOXEMETRY PANEL
Carboxyhemoglobin: 1.2 % (ref 0.5–1.5)
Methemoglobin: 0.6 % (ref 0.0–1.5)
O2 Saturation: 40.2 %
Total hemoglobin: 16.2 g/dL — ABNORMAL HIGH (ref 12.0–16.0)

## 2020-04-12 LAB — COMPREHENSIVE METABOLIC PANEL
ALT: 172 U/L — ABNORMAL HIGH (ref 0–44)
AST: 144 U/L — ABNORMAL HIGH (ref 15–41)
Albumin: 2.9 g/dL — ABNORMAL LOW (ref 3.5–5.0)
Alkaline Phosphatase: 59 U/L (ref 38–126)
Anion gap: 11 (ref 5–15)
BUN: 23 mg/dL — ABNORMAL HIGH (ref 6–20)
CO2: 20 mmol/L — ABNORMAL LOW (ref 22–32)
Calcium: 8.5 mg/dL — ABNORMAL LOW (ref 8.9–10.3)
Chloride: 105 mmol/L (ref 98–111)
Creatinine, Ser: 0.91 mg/dL (ref 0.61–1.24)
GFR, Estimated: >60 mL/min (ref >60)
Glucose, Bld: 120 mg/dL — ABNORMAL HIGH (ref 70–99)
Potassium: 4.6 mmol/L (ref 3.5–5.1)
Sodium: 136 mmol/L (ref 135–145)
Total Bilirubin: 4 mg/dL — ABNORMAL HIGH (ref 0.3–1.2)
Total Protein: 6.6 g/dL (ref 6.5–8.1)

## 2020-04-12 LAB — AMYLASE: Amylase: 159 U/L — ABNORMAL HIGH (ref 28–100)

## 2020-04-12 LAB — TSH: TSH: 2.246 u[IU]/mL (ref 0.350–4.500)

## 2020-04-12 LAB — APTT: aPTT: 35 seconds (ref 24–36)

## 2020-04-12 LAB — PROTIME-INR
INR: 1.9 — ABNORMAL HIGH (ref 0.8–1.2)
Prothrombin Time: 21.2 seconds — ABNORMAL HIGH (ref 11.4–15.2)

## 2020-04-12 LAB — FERRITIN: Ferritin: 434 ng/mL — ABNORMAL HIGH (ref 24–336)

## 2020-04-12 LAB — LIPASE, BLOOD: Lipase: 155 U/L — ABNORMAL HIGH (ref 11–51)

## 2020-04-12 LAB — ECHOCARDIOGRAM COMPLETE
Area-P 1/2: 4.7 cm2
Height: 73 in
S' Lateral: 5.59 cm
Weight: 4768 oz

## 2020-04-12 LAB — HEMOGLOBIN A1C
Hgb A1c MFr Bld: 6.3 % — ABNORMAL HIGH (ref 4.8–5.6)
Mean Plasma Glucose: 134.11 mg/dL

## 2020-04-12 LAB — BRAIN NATRIURETIC PEPTIDE: B Natriuretic Peptide: 1153.9 pg/mL — ABNORMAL HIGH (ref 0.0–100.0)

## 2020-04-12 IMAGING — NM NM HEPATOBILIARY IMAGE, INC GB
2 series · 12 of 12 positions shown · non-contrast
Comparison: Abdominal sonogram [DATE]

CLINICAL DATA: Evaluate for cholecystitis. Nausea and vomiting.
Right upper quadrant pain.

EXAM:
NUCLEAR MEDICINE HEPATOBILIARY IMAGING
TECHNIQUE: Sequential images of the abdomen were obtained [DATE] minutes
following intravenous administration of radiopharmaceutical.
RADIOPHARMACEUTICALS:  7.5 mCi [59]  Choletec IV

[Series 1: biliary · 3.25mm/px · 6 of 60 frames shown]
[frame 6/60]
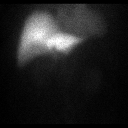
[frame 16/60]
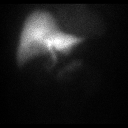
[frame 26/60]
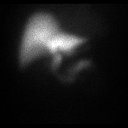
[frame 36/60]
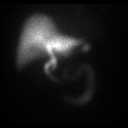
[frame 46/60]
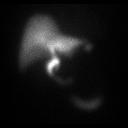
[frame 56/60]
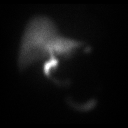

[Series 2: anterior · 3.25mm/px · 6 of 30 frames shown]
[frame 3/30]
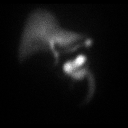
[frame 8/30]
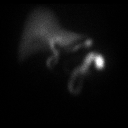
[frame 13/30]
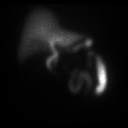
[frame 18/30]
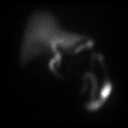
[frame 23/30]
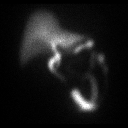
[frame 28/30]
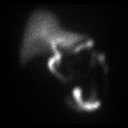

[12 of 12 positions shown; findings below may reference images not displayed]

FINDINGS: Prompt uptake and biliary excretion of activity by the liver is
seen. Biliary activity passes into small bowel, consistent with
patent common bile duct. After 60 minutes no gallbladder activity
was visualized. The patient subsequently received 3 mg of morphine,
IV. Imaging was performed for an additional 30 minutes without
evidence for gallbladder activity.
IMPRESSION: 1. Nonvisualization of the gallbladder which is compatible with
acute cholecystitis.

## 2020-04-12 IMAGING — DX DG CHEST 1V PORT
1 series · 1 of 1 positions shown · non-contrast
Comparison: [DATE]

CLINICAL DATA: Evaluate for congestive heart failure.

EXAM:
PORTABLE CHEST 1 VIEW

[chest ap]
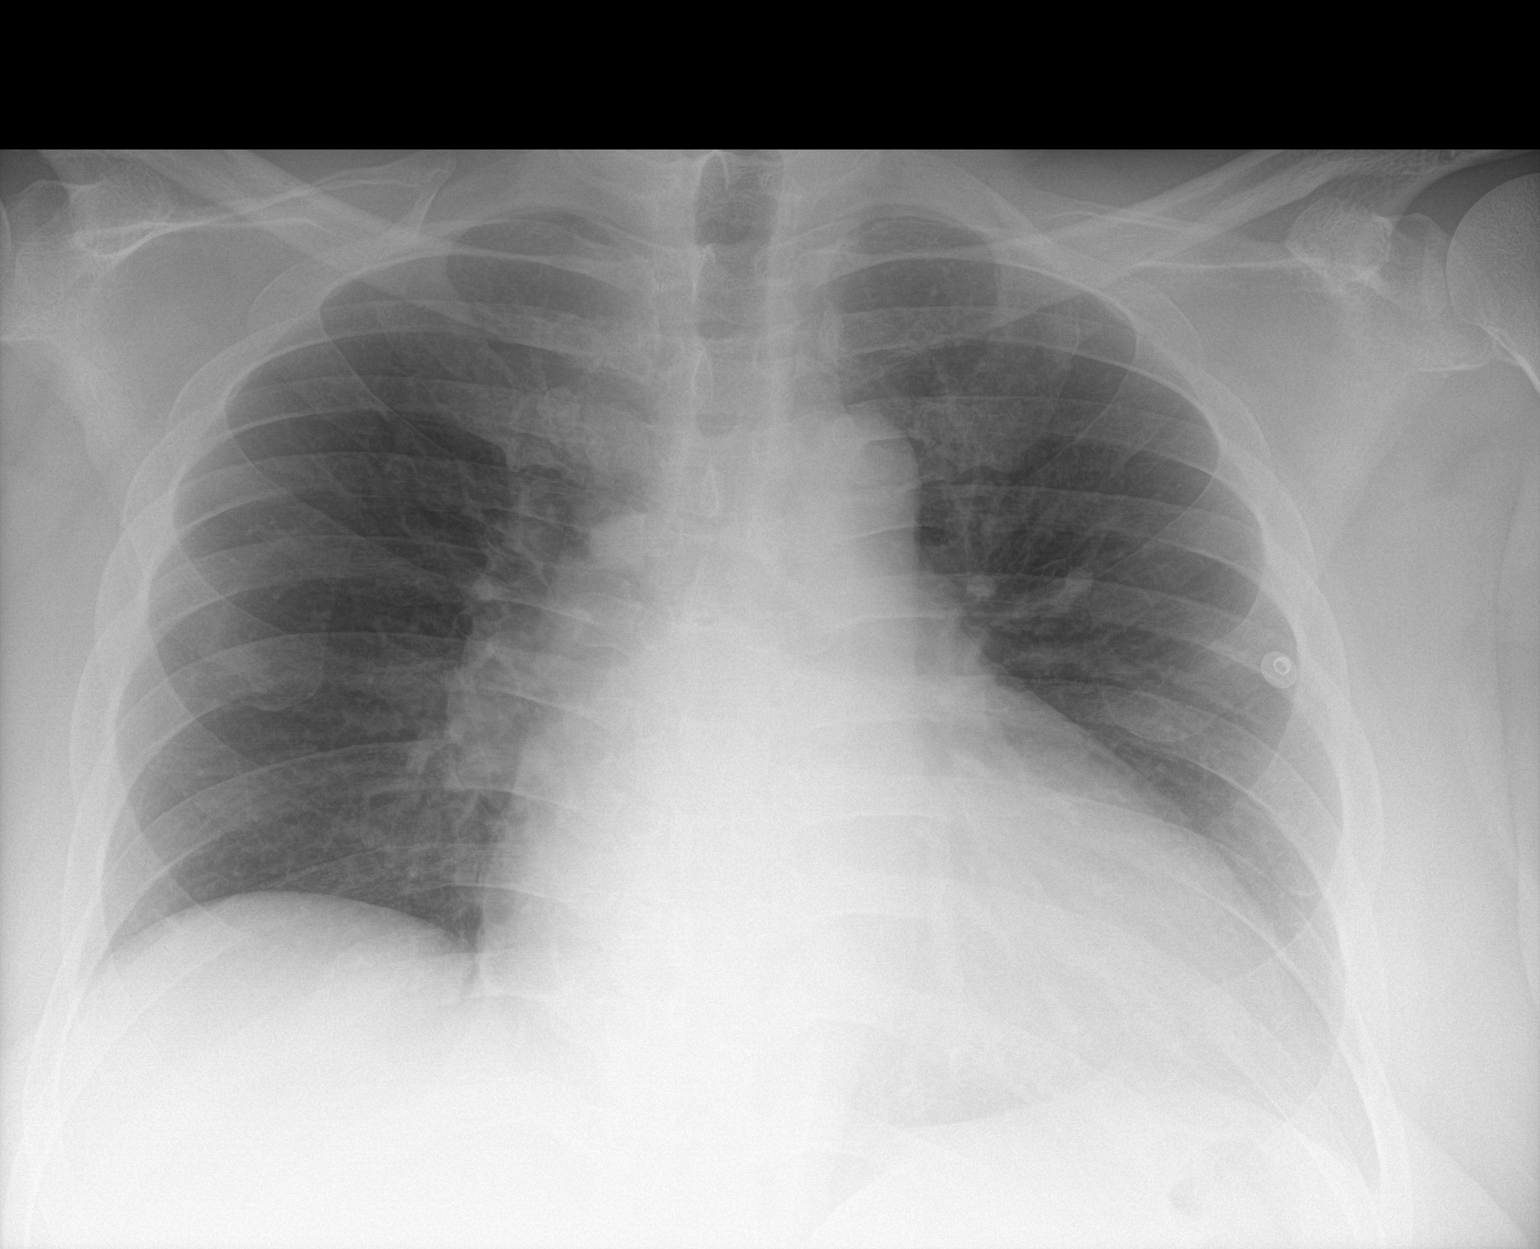

[1 of 1 positions shown; findings below may reference images not displayed]

FINDINGS: The cardiac silhouette is markedly enlarged and unchanged in size.
Both lungs are clear. The visualized skeletal structures are
unremarkable.
IMPRESSION: 1. Cardiomegaly without acute or active cardiopulmonary disease.

## 2020-04-12 IMAGING — CT CT HEAD W/O CM
4 series · 17 of 47 positions shown, 19 images · non-contrast
Comparison: MR head, dated [DATE]

CLINICAL DATA: Altered mental status.

EXAM:
CT HEAD WITHOUT CONTRAST
TECHNIQUE: Contiguous axial images were obtained from the base of the skull
through the vertex without intravenous contrast.

[Series 2: head bone · axial · 0.47mm/px · z∈[-57,-1]mm · 4 of 81 slices shown]
[im 9/81  bone]
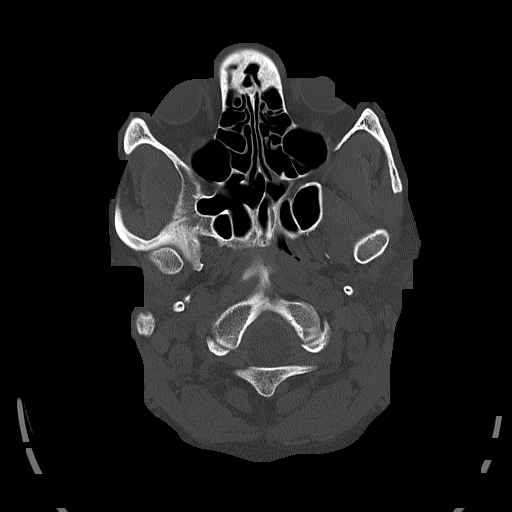
[im 17/81  bone]
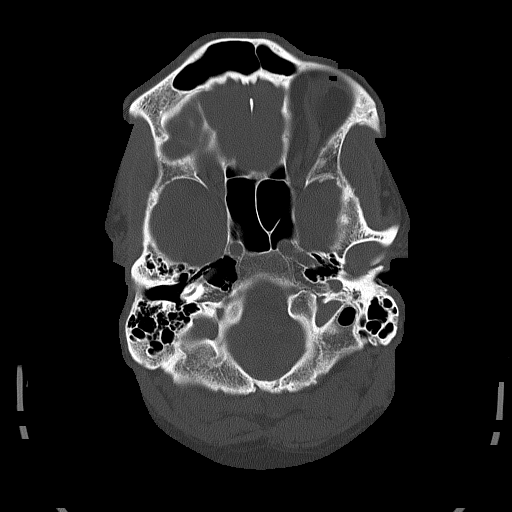
[im 25/81  bone]
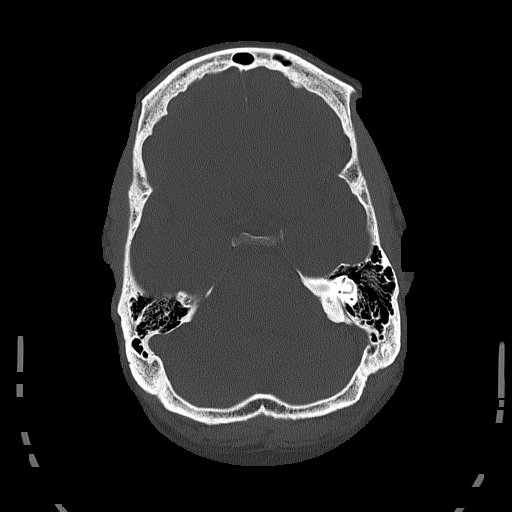
[im 37/81  bone]
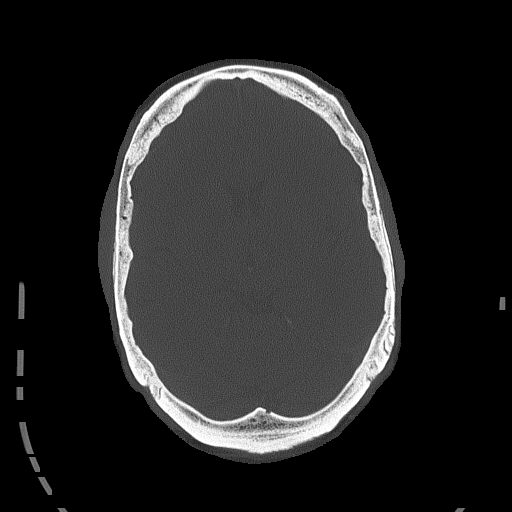

[Series 3: head wo · axial · 0.47mm/px · z∈[-53,+67]mm · 7 of 33 slices shown, 9 images]
[im 5/33  brain]
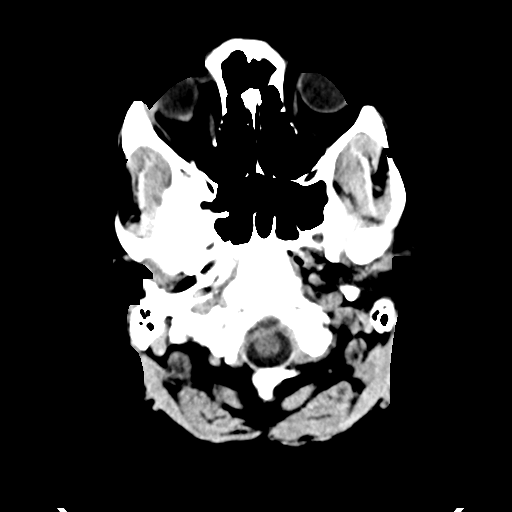
[im 5/33  bone]
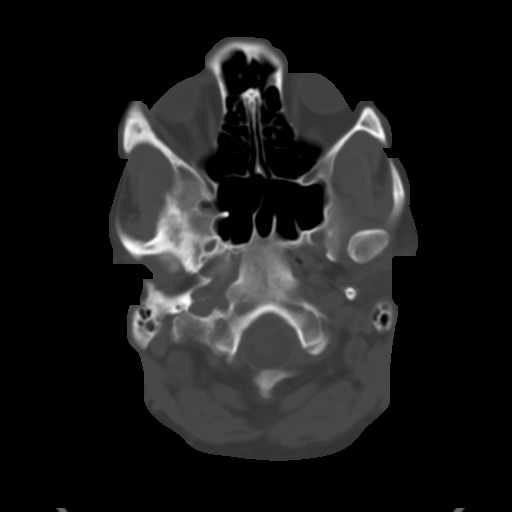
[im 9/33  brain]
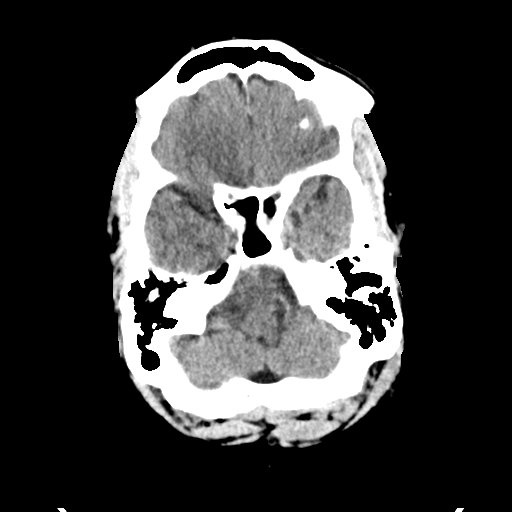
[im 13/33  brain]
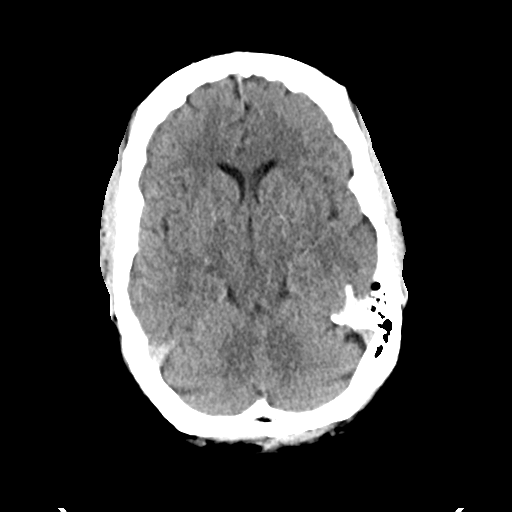
[im 17/33  brain]
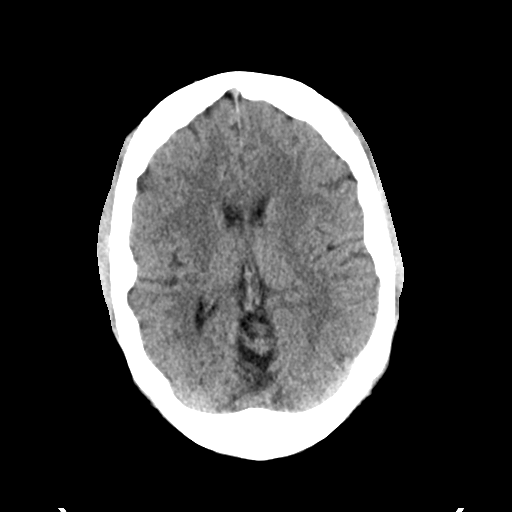
[im 21/33  brain]
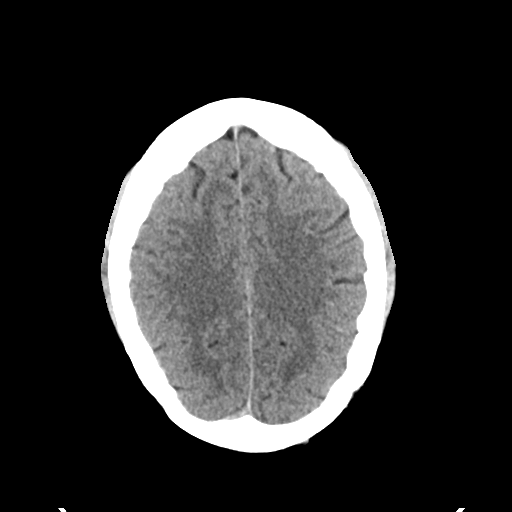
[im 21/33  bone]
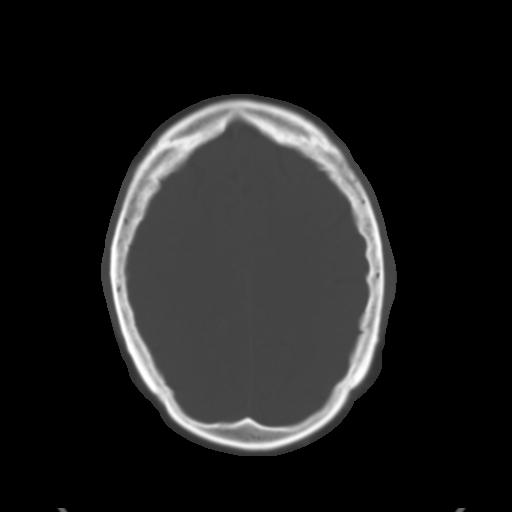
[im 25/33  brain]
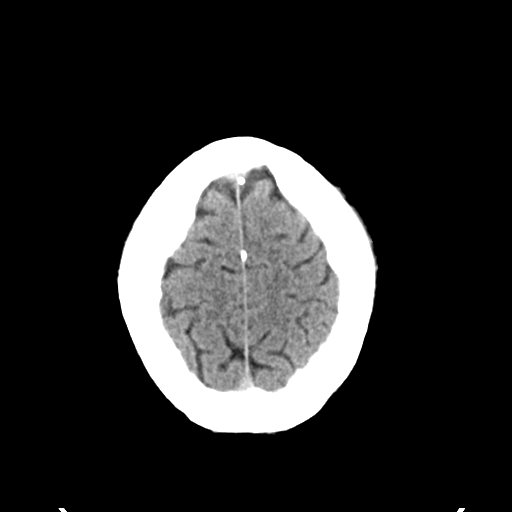
[im 29/33  brain]
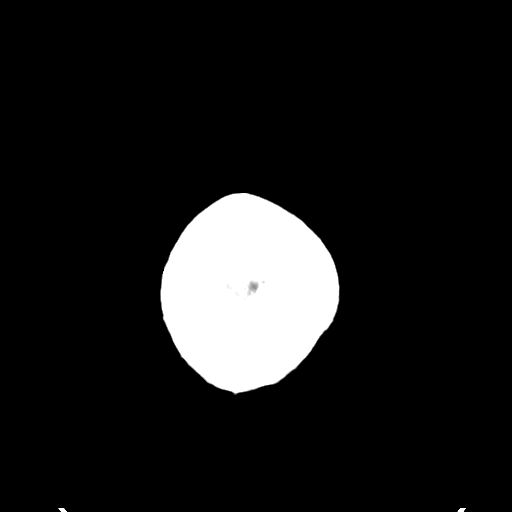

[Series 4: coronal soft tissue · coronal · 0.34mm/px · 3 of 75 slices shown]
[im 25/75  brain]
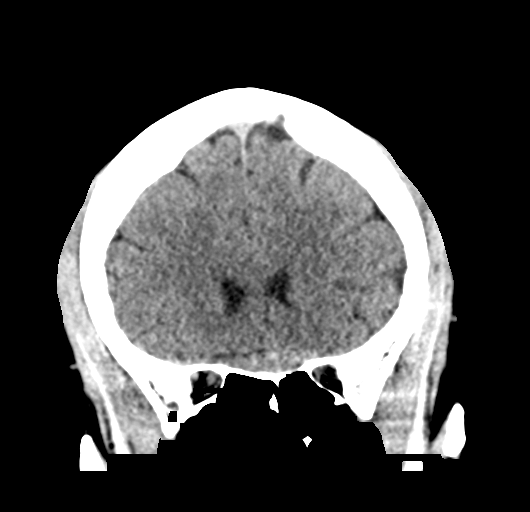
[im 33/75  brain]
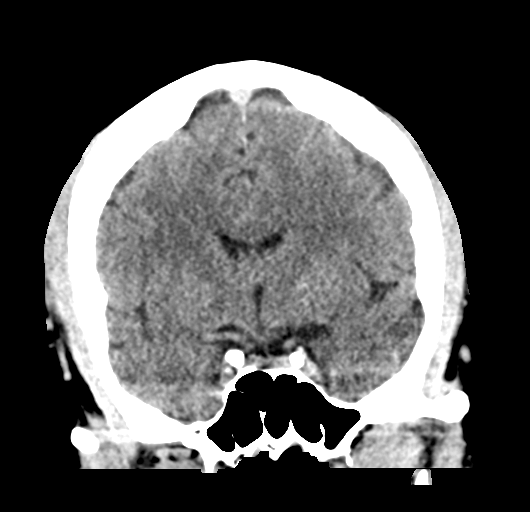
[im 42/75  brain]
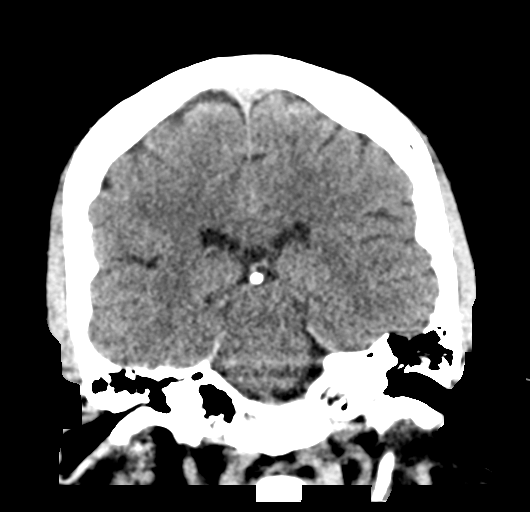

[Series 5: sagittal soft tissue · sagittal · 0.37mm/px · 3 of 56 slices shown]
[im 10/56  brain]
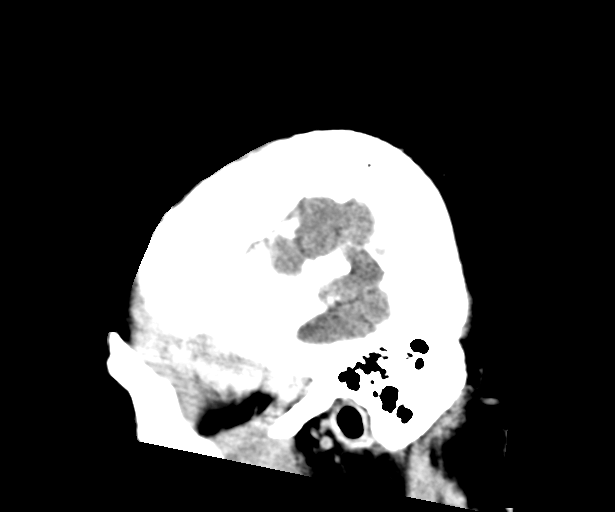
[im 19/56  brain]
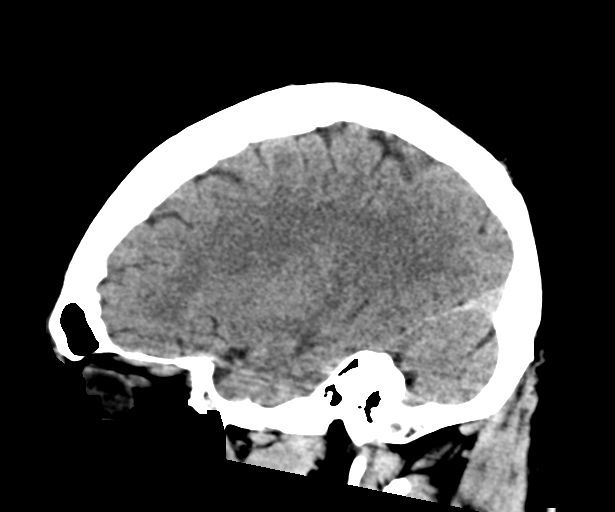
[im 28/56  brain]
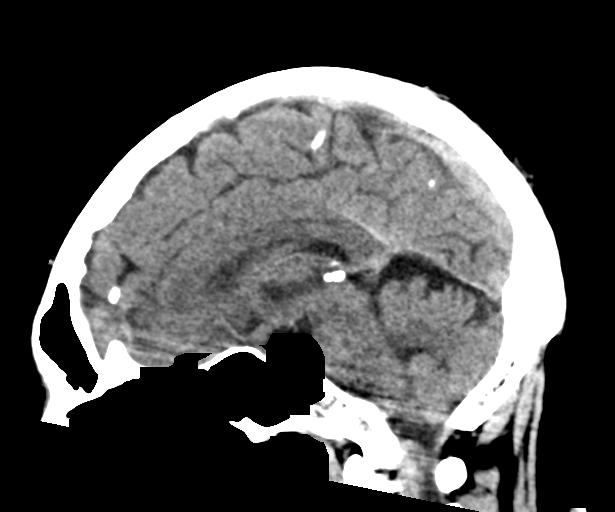

[17 of 47 positions shown; findings below may reference images not displayed]

FINDINGS: Brain: No evidence of acute infarction, hemorrhage, hydrocephalus,
extra-axial collection or mass lesion/mass effect.

Vascular: No hyperdense vessel or unexpected calcification.

Skull: Normal. Negative for fracture or focal lesion.

Sinuses/Orbits: No acute finding.

Other: None.
IMPRESSION: No acute intracranial pathology.

## 2020-04-12 MED ORDER — TECHNETIUM TC 99M MEBROFENIN IV KIT: 7.5000 | PACK | Freq: Once | INTRAVENOUS | Status: DC | PRN

## 2020-04-12 MED ORDER — HEPARIN (PORCINE) 25000 UT/250ML-% IV SOLN
2300.0000 [IU]/h | INTRAVENOUS | Status: DC
  Administered 2020-04-12: 2000 [IU]/h via INTRAVENOUS
  Administered 2020-04-13 – 2020-04-15 (×6): 2300 [IU]/h via INTRAVENOUS
  Filled 2020-04-12 (×10): qty 250

## 2020-04-12 MED ORDER — MORPHINE BOLUS VIA INFUSION: 3.0000 mg | Freq: Once | INTRAVENOUS | Status: DC

## 2020-04-12 MED ORDER — PERFLUTREN LIPID MICROSPHERE
1.0000 mL | INTRAVENOUS | Status: AC | PRN
  Administered 2020-04-12: 2 mL via INTRAVENOUS
  Filled 2020-04-12: qty 10

## 2020-04-12 MED ORDER — MORPHINE SULFATE (PF) 2 MG/ML IV SOLN
INTRAVENOUS | Status: AC
  Filled 2020-04-12: qty 2

## 2020-04-12 MED ORDER — MORPHINE SULFATE (PF) 4 MG/ML IV SOLN
3.0000 mg | Freq: Once | INTRAVENOUS | Status: AC
Start: 2020-04-12 — End: 2020-04-12
  Administered 2020-04-12: 3 mg via INTRAVENOUS

## 2020-04-12 MED ORDER — MORPHINE SULFATE (PF) 2 MG/ML IV SOLN
2.0000 mg | Freq: Once | INTRAVENOUS | Status: AC
  Administered 2020-04-13: 2 mg via INTRAVENOUS
  Filled 2020-04-12: qty 1

## 2020-04-12 MED ORDER — HEPARIN BOLUS VIA INFUSION
3300.0000 [IU] | INTRAVENOUS | Status: AC
  Administered 2020-04-12: 3300 [IU] via INTRAVENOUS
  Filled 2020-04-12: qty 3300

## 2020-04-12 MED ORDER — THIAMINE HCL 100 MG/ML IJ SOLN
100.0000 mg | Freq: Every day | INTRAMUSCULAR | Status: DC
  Administered 2020-04-13 – 2020-04-15 (×3): 100 mg via INTRAVENOUS
  Filled 2020-04-12 (×3): qty 2

## 2020-04-12 MED ORDER — PIPERACILLIN-TAZOBACTAM 3.375 G IVPB
3.3750 g | Freq: Three times a day (TID) | INTRAVENOUS | Status: AC
  Administered 2020-04-13 – 2020-04-19 (×20): 3.375 g via INTRAVENOUS
  Filled 2020-04-12 (×25): qty 50

## 2020-04-12 MED ORDER — SODIUM CHLORIDE 0.9 % IV SOLN
6.2500 mg | Freq: Four times a day (QID) | INTRAVENOUS | Status: DC | PRN
  Filled 2020-04-12: qty 0.25

## 2020-04-12 NOTE — Progress Notes (Signed)
ANTICOAGULATION CONSULT NOTE - Initial Consult  Pharmacy Consult for Heparin Indication: DVT, Apical thrombus  Allergies  Allergen Reactions  . Pseudoephedrine     REACTION: jittery    Patient Measurements: Height: 6\' 1"  (185.4 cm) Weight: 135.2 kg (298 lb) IBW/kg (Calculated) : 79.9 Heparin Dosing Weight: 110 kg  Vital Signs: Temp: 97.6 F (36.4 C) (03/14 0800) Temp Source: Oral (03/14 0800) BP: 122/99 (03/14 0800) Pulse Rate: 110 (03/14 0800)  Labs: Recent Labs    04/11/20 1140 04/11/20 1841 04/11/20 2111 04/12/20 0027 04/12/20 0752  HGB 16.2 16.2  --  16.0 15.8  HCT 50.2 51.2  --  51.4 50.4  PLT 146*  --   --   --   --   CREATININE 1.12  --   --  0.91  --   TROPONINIHS 42* 48* 38*  --   --     Estimated Creatinine Clearance: 138.6 mL/min (by C-G formula based on SCr of 0.91 mg/dL).   Medical History: Past Medical History:  Diagnosis Date  . Allergy   . Anxiety   . Arthritis   . Fundic gland polyposis of stomach   . GERD with esophagitis   . Gout   . Hx of adenomatous colonic polyps   . Hypertension     Medications:  Scheduled:  . allopurinol  300 mg Oral Daily  . buPROPion  100 mg Oral BID  . metoCLOPramide (REGLAN) injection  5 mg Intravenous Q6H  . morphine      . pantoprazole (PROTONIX) IV  40 mg Intravenous Q12H  . sucralfate  1 g Oral TID WC & HS   Infusions:  . piperacillin-tazobactam (ZOSYN)  IV    . promethazine (PHENERGAN) injection      Assessment: 38 yoM presented on 3/14 with abdominal pain concerning for IAI, but Echo now shows large apical thrombus and RLE has swelling concerning for DVT.  No history of prior anticoagulation.  Baseline coags pending.  Baseline H/H wnl.  Goal of Therapy:  Heparin level 0.3-0.7 units/ml Monitor platelets by anticoagulation protocol: Yes   Plan:  Baseline PTT, PT/INR Give heparin 3300 units bolus IV x 1 Start heparin IV infusion at 2000 units/hr Heparin level 6 hours after starting Daily  heparin level and CBC   Gretta Arab PharmD, BCPS Clinical Pharmacist [facility] main pharmacy [phone] 04/12/2020,3:30 PM

## 2020-04-12 NOTE — Progress Notes (Signed)
  Echocardiogram 2D Echocardiogram has been performed.  Kevin Baxter 04/12/2020, 2:14 PM

## 2020-04-12 NOTE — Progress Notes (Signed)
Initial Nutrition Assessment  DOCUMENTATION CODES:   Obesity unspecified  INTERVENTION:   Once diet advanced: -Monitor for refeeding syndrome -Boost Breeze po TID, each supplement provides 250 kcal and 9 grams of protein -Prosource Plus PO BID, each provides 100 kcals and 15g protein  NUTRITION DIAGNOSIS:   Inadequate oral intake related to nausea,vomiting,poor appetite as evidenced by per patient/family report.  GOAL:   Patient will meet greater than or equal to 90% of their needs  MONITOR:   Diet advancement,Labs,Weight trends,I & O's  REASON FOR ASSESSMENT:   Malnutrition Screening Tool    ASSESSMENT:   50 y.o. male with medical history significant for hypertension, gout, anxiety/depression, morbid obesity, GERD, history of polyposis of his stomach and colonic polyps, reported esophageal dysphagia who has had abdominal pain nausea and vomiting going on for several weeks  Recent admission from 1/27-1/30 for similar complaint and treated for acute pancreatitis.Since discharge she has been having intractable nausea vomiting and spitting blood and not tolerating diet.  Patient not in room at time of visit, pt's wife at bedside. Per pt's wife, pt has not been eating well since early January 2022. Pt had decreased appetite then progressed to not tolerating solid foods to now when he cannot tolerate even liquids.  Pt had EGD on 3/11 and CT which revealed resolving pancreatitis.  Pt having HIDA scan today. Per GI note, may need repeat EGD. Currently NPO.   Per pt' wife, pt has lost 30 lbs. Has not been weighed this admission.  Per current recorded weights, pt has lost 17 lbs since 1/27 (5% wt loss x 1.5 months, insignificant for time frame).  Medications: Reglan, Carafate, Lactated ringers  Labs reviewed.  NUTRITION - FOCUSED PHYSICAL EXAM:  Unable to complete  Diet Order:   Diet Order            Diet NPO time specified  Diet effective now                  EDUCATION NEEDS:   No education needs have been identified at this time  Skin:  Skin Assessment: Reviewed RN Assessment  Last BM:  3/13  Height:   Ht Readings from Last 1 Encounters:  04/11/20 6\' 1"  (1.854 m)    Weight:   Wt Readings from Last 1 Encounters:  04/11/20 135.2 kg    BMI:  Body mass index is 39.32 kg/m.  Estimated Nutritional Needs:   Kcal:  2200-2400  Protein:  100-115g  Fluid:  2.2L/day  Clayton Bibles, MS, RD, LDN Inpatient Clinical Dietitian Contact information available via Amion

## 2020-04-12 NOTE — Consult Note (Signed)
Consult Note  Kevin Baxter [birth year]  [id number].    Requesting MD: Dr. Tarri Glenn Chief Complaint/Reason for Consult: Positive HIDA  HPI:  Patient is a 50 year old male with a history of HTN who presented to [facility] 3/13 with intractable nausea and vomiting.  This has been going on for the last month with about a 33# weight loss.  Earlier in January he started having bloating.  He was even admitted in February with a diagnosis of pancreatitis but his lipase was only 82.  He improved and was discharged within a couple of days.  When he presented back to the ED earlier in March with continued symptoms, he then underwent an EGD on 3/11 for evaluation for which he would found to have a polyp that was biopsied.  He has had some spitting up of blood since that time, but minimal.  After this procedure, which he received propofol, he had some AMS with agitation etc.  He was taken to [facility] for an MRI of his brain.  This was negative at that time.  After fluid hydration, he was discharged home.  He presented back yesterday as stated above for further N/V symptoms.  He has been found to have increasing lipase levels, now up to 150.  His TB is up to 4 and increasing LFTs.  He has a normal WBC.  He denies any fevers or chills.  He has an [place] that reveals no gallstones, but sludge with some wall thickening raising a concern for a degree of acalculous cholecystitis.  A HIDA was recommended which revealed no filling of the cystic duct. He currently denies any CP or SOB, although his wife has noticed an increase in WOB at times when he is sleeping, but otherwise no definitive SOB.  He currently denies abdominal pain.  He has been having some right back pain that he thought was sciatica for the last week.  He now is having unilateral RLE edema and some tenderness.  He denies a history of clotting disorders in himself or his family.  We have been asked to see him for his positive HIDA scan.  ROS: ROS: Please see  HPI, otherwise all other systems have been reviewed and are negative.  Family History  Problem Relation Age of Onset  . Cancer Mother        CERVICAL  . Hypertension Father   . Heart failure Father        no MI, d/t etoh  . Diabetes Sister   . Diabetes Sister   . Diabetes Paternal Aunt   . Diabetes Cousin   . Colon cancer Neg Hx   . Prostate cancer Neg Hx   . Stroke Neg Hx   . Esophageal cancer Neg Hx   . Rectal cancer Neg Hx   . Stomach cancer Neg Hx     Past Medical History:  Diagnosis Date  . Allergy   . Anxiety   . Arthritis   . Fundic gland polyposis of stomach   . GERD with esophagitis   . Gout   . Hx of adenomatous colonic polyps   . Hypertension     Past Surgical History:  Procedure Laterality Date  . COLONOSCOPY  05/2019  . ESOPHAGOGASTRODUODENOSCOPY  05/2019  . HAND SURGERY Right    2006 for a FX    Social History:  reports that he has never smoked. He has never used smokeless tobacco. He reports current alcohol use. He reports that  he does not use drugs.  Allergies:  Allergies  Allergen Reactions  . Pseudoephedrine     REACTION: jittery    Facility-Administered Medications Prior to Admission  Medication Dose Route Frequency Provider Last Rate Last Admin  . 0.9 %  sodium chloride infusion  500 mL Intravenous Continuous Gatha Mayer, MD       Medications Prior to Admission  Medication Sig Dispense Refill  . allopurinol (ZYLOPRIM) 300 MG tablet Take 1 tablet (300 mg total) by mouth daily. 30 tablet 0  . buPROPion (WELLBUTRIN) 100 MG tablet Take 1 tablet a day x 1 week, then 1 tab BID (Patient taking differently: Take 100 mg by mouth 2 (two) times daily.) 60 tablet 1  . dicyclomine (BENTYL) 20 MG tablet Take 1 tablet (20 mg total) by mouth every 6 (six) hours as needed for spasms. (Patient taking differently: Take 20 mg by mouth daily.) 90 tablet 0  . losartan (COZAAR) 50 MG tablet Take 2 tablets (100 mg total) by mouth daily. 180 tablet 0  .  ondansetron (ZOFRAN ODT) 4 MG disintegrating tablet Take 1 tablet (4 mg total) by mouth every 8 (eight) hours as needed for nausea or vomiting. 20 tablet 0  . sucralfate (CARAFATE) 1 g tablet Take 1 tablet (1 g total) by mouth 4 (four) times daily -  with meals and at bedtime. (Patient taking differently: Take 1 g by mouth daily.) 21 tablet 0  . pantoprazole (PROTONIX) 40 MG tablet Take 1 tablet (40 mg total) by mouth daily. (Patient not taking: No sig reported) 30 tablet 3    Blood pressure (!) 122/99, pulse (!) 110, temperature 97.6 F (36.4 C), temperature source Oral, resp. rate 16, height 6\' 1"  (1.854 m), weight 135.2 kg, SpO2 90 %. Physical Exam:  General: pleasant, WD, obese, male who is laying in bed in NAD HEENT: head is normocephalic, atraumatic.  Sclera are noninjected.  PERRL.  Ears and nose without any masses or lesions.  Mouth is pink and moist Heart: regular rhythm, but tachy with a few ectopic beats.  Normal s1,s2. No obvious gallops, or rubs noted.  May have a faintly murmur but difficult to tell due to tachycardia  Palpable radial and pedal pulses bilaterally Lungs: CTAB, no wheezes, rhonchi, or rales noted.  Respiratory effort nonlabored Abd: soft, NT, ND, +BS, no masses, hernias, or organomegaly MS: all 4 extremities are symmetrical with no cyanosis, clubbing, or edema, except his RLE which is massively edematous from his groin all the way down to his ankle.  This is mildly tender over his calf.  No overtly warm or erythematous.  Calf is tight but no compartment syndrome. Skin: warm and dry with no masses, lesions, or rashes Neuro: Cranial nerves 2-12 grossly intact, sensation is normal throughout Psych: A&Ox3 with an appropriate affect.   Results for orders placed or performed during the hospital encounter of 04/11/20 (from the past 48 hour(s))  CBC with Differential     Status: Abnormal   Collection Time: 04/11/20 11:40 AM  Result Value Ref Range   WBC 6.9 4.0 - 10.5 K/uL    RBC 6.32 (H) 4.22 - 5.81 MIL/uL   Hemoglobin 16.2 13.0 - 17.0 g/dL   HCT 50.2 39.0 - 52.0 %   MCV 79.4 (L) 80.0 - 100.0 fL   MCH 25.6 (L) 26.0 - 34.0 pg   MCHC 32.3 30.0 - 36.0 g/dL   RDW 17.6 (H) 11.5 - 15.5 %   Platelets 146 (L) 150 -  400 K/uL   nRBC 0.0 0.0 - 0.2 %   Neutrophils Relative % 71 %   Neutro Abs 4.9 1.7 - 7.7 K/uL   Lymphocytes Relative 17 %   Lymphs Abs 1.1 0.7 - 4.0 K/uL   Monocytes Relative 12 %   Monocytes Absolute 0.8 0.1 - 1.0 K/uL   Eosinophils Relative 0 %   Eosinophils Absolute 0.0 0.0 - 0.5 K/uL   Basophils Relative 0 %   Basophils Absolute 0.0 0.0 - 0.1 K/uL   Immature Granulocytes 0 %   Abs Immature Granulocytes 0.02 0.00 - 0.07 K/uL    Comment: Performed at [facility], [address] [place], [place] [address]  Comprehensive metabolic panel     Status: Abnormal   Collection Time: 04/11/20 11:40 AM  Result Value Ref Range   Sodium 135 135 - 145 mmol/L   Potassium 4.5 3.5 - 5.1 mmol/L   Chloride 102 98 - 111 mmol/L   CO2 21 (L) 22 - 32 mmol/L   Glucose, Bld 125 (H) 70 - 99 mg/dL    Comment: Glucose reference range applies only to samples taken after fasting for at least 8 hours.   BUN 24 (H) 6 - 20 mg/dL   Creatinine, Ser 1.12 0.61 - 1.24 mg/dL   Calcium 8.6 (L) 8.9 - 10.3 mg/dL   Total Protein 6.8 6.5 - 8.1 g/dL   Albumin 2.9 (L) 3.5 - 5.0 g/dL   AST 120 (H) 15 - 41 U/L   ALT 134 (H) 0 - 44 U/L   Alkaline Phosphatase 64 38 - 126 U/L   Total Bilirubin 3.9 (H) 0.3 - 1.2 mg/dL   GFR, Estimated >60 >60 mL/min    Comment: (NOTE) Calculated using the CKD-EPI Creatinine Equation (2021)    Anion gap 12 5 - 15    Comment: Performed at [facility], [address] [place], [place] [address]  Troponin I (High Sensitivity)     Status: Abnormal   Collection Time: 04/11/20 11:40 AM  Result Value Ref Range   Troponin I (High Sensitivity) 42 (H) <18 ng/L    Comment: (NOTE) Elevated high sensitivity troponin I (hsTnI) values  and significant  changes across serial measurements may suggest ACS but many other  chronic and acute conditions are known to elevate hsTnI results.  Refer to the "Links" section for chest pain algorithms and additional  guidance. Performed at [facility], [address] [place], [place] [address]   Magnesium     Status: None   Collection Time: 04/11/20 11:40 AM  Result Value Ref Range   Magnesium 2.0 1.7 - 2.4 mg/dL    Comment: Performed at [facility], [address] [place], [place] [address]  Resp Panel by RT-PCR (Flu A&B, Covid) Nasopharyngeal Swab     Status: None   Collection Time: 04/11/20 11:40 AM   Specimen: Nasopharyngeal Swab; Nasopharyngeal(NP) swabs in vial transport medium  Result Value Ref Range   SARS Coronavirus 2 by RT PCR NEGATIVE NEGATIVE    Comment: (NOTE) SARS-CoV-2 target nucleic acids are NOT DETECTED.  The SARS-CoV-2 RNA is generally detectable in upper respiratory specimens during the acute phase of infection. The lowest concentration of SARS-CoV-2 viral copies this assay can detect is 138 copies/mL. A negative result does not preclude SARS-Cov-2 infection and should not be used as the sole basis for treatment or other patient management decisions. A negative result may occur with  improper specimen collection/handling, submission of specimen other than nasopharyngeal swab, presence of viral mutation(s) within the areas targeted by this assay, and inadequate number of viral copies(<138 copies/mL). A negative result must be combined with clinical observations, patient history, and epidemiological information. The expected result is Negative.  Fact Sheet for Patients:  EntrepreneurPulse.com.au  Fact Sheet for Healthcare Providers:  IncredibleEmployment.be  This test is no t yet approved or cleared by the [place] FDA and  has been authorized for detection and/or diagnosis of SARS-CoV-2  by FDA under an Emergency Use Authorization (EUA). This EUA will remain  in effect (meaning this test can be used) for the duration of the COVID-19 declaration under Section 564(b)(1) of the Act, 21 U.S.C.section 360bbb-3(b)(1), unless the authorization is terminated  or revoked sooner.       Influenza A by PCR NEGATIVE NEGATIVE   Influenza B by PCR NEGATIVE NEGATIVE    Comment: (NOTE) The Xpert Xpress SARS-CoV-2/FLU/RSV plus assay is intended as an aid in the diagnosis of influenza from Nasopharyngeal swab specimens and should not be used as a sole basis for treatment. Nasal washings and aspirates are unacceptable for Xpert Xpress SARS-CoV-2/FLU/RSV testing.  Fact Sheet for Patients: EntrepreneurPulse.com.au  Fact Sheet for Healthcare Providers: IncredibleEmployment.be  This test is not yet approved or cleared by the [place] FDA and has been authorized for detection and/or diagnosis of SARS-CoV-2 by FDA under an Emergency Use Authorization (EUA). This EUA will remain in effect (meaning this test can be used) for the duration of the COVID-19 declaration under Section 564(b)(1) of the Act, 21 U.S.C. section 360bbb-3(b)(1), unless the authorization is terminated or revoked.  Performed at [facility], [address] [place], [place] [address]   Lipase, blood     Status: Abnormal   Collection Time: 04/11/20 11:40 AM  Result Value Ref Range   Lipase 107 (H) 11 - 51 U/L    Comment: Performed at [facility], [address] [place], [place] [address]  Troponin I (High Sensitivity)     Status: Abnormal   Collection Time: 04/11/20  6:41 PM  Result Value Ref Range   Troponin I (High Sensitivity) 48 (H) <18 ng/L    Comment: (NOTE) Elevated high sensitivity troponin I (hsTnI) values and significant  changes across serial measurements may suggest ACS but many other  chronic and acute conditions are known to elevate  hsTnI results.  Refer to the "Links" section for chest pain algorithms and additional  guidance. Performed at [facility], [place] [address], [place], [place] [address]   Lactic acid, plasma     Status: Abnormal   Collection Time: 04/11/20  6:41 PM  Result Value Ref Range   Lactic Acid, Venous 2.2 (HH) 0.5 - 1.9 mmol/L    Comment: CRITICAL RESULT CALLED TO, READ BACK BY AND VERIFIED WITH: MEREDITH LINE BACK RN 04/11/20 @2134  BY P.HENDERSON Performed at [facility], [place] [address], [place], [place] [address]   Lipase, blood     Status: Abnormal   Collection Time: 04/11/20  6:41 PM  Result Value Ref Range   Lipase 135 (H) 11 - 51 U/L    Comment: Performed at [facility], [place] [address] [place], [place] [address]  Hemoglobin and hematocrit, blood     Status: None   Collection Time: 04/11/20  6:41 PM  Result Value Ref Range   Hemoglobin 16.2 13.0 - 17.0 g/dL   HCT 51.2  39.0 - 52.0 %    Comment: Performed at [facility], [place] [address], [place], [place] [address]  Vitamin B12     Status: None   Collection Time: 04/11/20  6:41 PM  Result Value Ref Range   Vitamin B-12 702 180 - 914 pg/mL    Comment: (NOTE) This assay is not validated for testing neonatal or myeloproliferative syndrome specimens for Vitamin B12 levels. Performed at [facility], [place] [address] [place], [place] [address]   Hemoglobin A1c     Status: Abnormal   Collection Time: 04/11/20  6:44 PM  Result Value Ref Range   Hgb A1c MFr Bld 6.3 (H) 4.8 - 5.6 %    Comment: (NOTE) Pre diabetes:          5.7%-6.4%  Diabetes:              >6.4%  Glycemic control for   <7.0% adults with diabetes    Mean Plasma Glucose 134.11 mg/dL    Comment: Performed at [place] [address], [place], [place] [address]  Lactic acid, plasma     Status: Abnormal   Collection Time: 04/11/20  9:11 PM  Result Value Ref Range    Lactic Acid, Venous 2.6 (HH) 0.5 - 1.9 mmol/L    Comment: CRITICAL RESULT CALLED TO, READ BACK BY AND VERIFIED WITH: Rocco Serene RN 04/11/20 @2254  BY P/HENDERSON Performed at [facility], [place] [address] [place], [place] [address]   Troponin I (High Sensitivity)     Status: Abnormal   Collection Time: 04/11/20  9:11 PM  Result Value Ref Range   Troponin I (High Sensitivity) 38 (H) <18 ng/L    Comment: (NOTE) Elevated high sensitivity troponin I (hsTnI) values and significant  changes across serial measurements may suggest ACS but many other  chronic and acute conditions are known to elevate hsTnI results.  Refer to the "Links" section for chest pain algorithms and additional  guidance. Performed at [facility], [place] [address] [place], [place] [address]   Comprehensive metabolic panel     Status: Abnormal   Collection Time: 04/12/20 12:27 AM  Result Value Ref Range   Sodium 136 135 - 145 mmol/L   Potassium 4.6 3.5 - 5.1 mmol/L   Chloride 105 98 - 111 mmol/L   CO2 20 (L) 22 - 32 mmol/L   Glucose, Bld 120 (H) 70 - 99 mg/dL    Comment: Glucose reference range applies only to samples taken after fasting for at least 8 hours.   BUN 23 (H) 6 - 20 mg/dL   Creatinine, Ser 0.91 0.61 - 1.24 mg/dL   Calcium 8.5 (L) 8.9 - 10.3 mg/dL   Total Protein 6.6 6.5 - 8.1 g/dL   Albumin 2.9 (L) 3.5 - 5.0 g/dL   AST 144 (H) 15 - 41 U/L   ALT 172 (H) 0 - 44 U/L   Alkaline Phosphatase 59 38 - 126 U/L   Total Bilirubin 4.0 (H) 0.3 - 1.2 mg/dL   GFR, Estimated >60 >60 mL/min    Comment: (NOTE) Calculated using the CKD-EPI Creatinine Equation (2021)    Anion gap 11 5 - 15    Comment: Performed at [facility], [place] [address] [place], [place] [address]  Lipase, blood     Status: Abnormal   Collection Time: 04/12/20 12:27 AM  Result Value Ref Range   Lipase 155 (H) 11 - 51 U/L  Comment: Performed at [facility],  [place] [address] [place], [place] [address]  Hemoglobin and hematocrit, blood     Status: None   Collection Time: 04/12/20 12:27 AM  Result Value Ref Range   Hemoglobin 16.0 13.0 - 17.0 g/dL   HCT 51.4 39.0 - 52.0 %    Comment: Performed at [facility], [place] [address] [place], [place] [address]  TSH     Status: None   Collection Time: 04/12/20 12:27 AM  Result Value Ref Range   TSH 2.246 0.350 - 4.500 uIU/mL    Comment: Performed by a 3rd Generation assay with a functional sensitivity of <=0.01 uIU/mL. Performed at [facility], [place] [address] [place], [place] [address]   Amylase     Status: Abnormal   Collection Time: 04/12/20 12:27 AM  Result Value Ref Range   Amylase 159 (H) 28 - 100 U/L    Comment: Performed at [facility], [place] [address] [place], [place] [address]  Hemoglobin and hematocrit, blood     Status: None   Collection Time: 04/12/20  7:52 AM  Result Value Ref Range   Hemoglobin 15.8 13.0 - 17.0 g/dL   HCT 50.4 39.0 - 52.0 %    Comment: Performed at [facility], [place] [address] [place], [place] [address]   NM Hepatobiliary Liver Func  Result Date: 04/12/2020 CLINICAL DATA:  Evaluate for cholecystitis. Nausea and vomiting. Right upper quadrant pain. EXAM: NUCLEAR MEDICINE HEPATOBILIARY IMAGING TECHNIQUE: Sequential images of the abdomen were obtained out to 60 minutes following intravenous administration of radiopharmaceutical. RADIOPHARMACEUTICALS:  7.5 mCi Tc-6m  Choletec IV COMPARISON:  Abdominal sonogram 04/11/2020 FINDINGS: Prompt uptake and biliary excretion of activity by the liver is seen. Biliary activity passes into small bowel, consistent with patent common bile duct. After 60 minutes no gallbladder activity was visualized. The patient subsequently received 3 mg of morphine, IV. Imaging was performed for an additional 30 minutes without evidence for gallbladder activity. IMPRESSION:  1. Nonvisualization of the gallbladder which is compatible with acute cholecystitis. Electronically Signed   By: Kerby Moors M.D.   On: 04/12/2020 13:00   US Abdomen Limited RUQ (LIVER/GB)  Result Date: 04/11/2020 CLINICAL DATA:  Pain with nausea and hemoptysis EXAM: ULTRASOUND ABDOMEN LIMITED RIGHT UPPER QUADRANT COMPARISON:  CT abdomen and pelvis April 08, 2020 FINDINGS: Gallbladder: There is sludge in the gallbladder. No gallstones are evident. Gallbladder wall is thickened. There is no appreciable pericholecystic fluid. No sonographic Murphy sign noted by sonographer. Common bile duct: Diameter: 4 mm. No intrahepatic or extrahepatic biliary duct dilatation evident. Liver: No focal lesion identified. Within normal limits in parenchymal echogenicity. Portal vein is patent on color Doppler imaging with normal direction of blood flow towards the liver. Other: There is a cyst in the upper pole the right kidney measuring 1.9 x 1.7 x 1.5 cm. IMPRESSION: 1. No gallstones evident. There is sludge in the gallbladder with gallbladder wall thickening. Question a degree of acalculus cholecystitis. This finding may warrant nuclear medicine hepatobiliary imaging study to assess for cystic duct patency. 2. Cyst arising from upper pole right kidney measuring 1.9 x 1.7 x 1.5 cm. 3.  Study otherwise unremarkable. Electronically Signed   By: Lowella Grip Baxter M.D.   On: 04/11/2020 14:13      Assessment/Plan HTN GERD with esophagitis  Gastric polyps s/p biopsies on EGD 04/09/20 Bilateral renal cysts - noted on CT 3/10  New HF with new  EF <20% New LV thrombus New RLE edema - d/w medical team regarding needs for duplex Tachycardia - question related to dehydration, but ? PE in the setting of other clots.  Defer to medical service.  He does have a normal O2 sat and denies CP/SOB  Acute acalculous cholecystitis  The patient has a HIDA scan that reveals no filling of the cystic duct c/w cholecystitis but has  prompt uptake of the biliary tree.  This is raising concern for acalculous cholecystitis.  However, the patient has a normal WBC as well as no abdominal pain whatsoever.  His history of N/V/bloating, etc is concerning for a biliary etiology.  However, his rise in TB (specifically) and lipase are not classic for acalculous cholecystitis, unless he has small stones that are not visualized.  Unfortunately, during my interview several other concerning findings were noted.  His echo now shows a large LV thrombus and I have found RLE edema concerning certainly for DVT in his leg.  I have spoken to the medical team to relay these findings.  His echo also shows an EF of <20% which is new as well.  Given all of these clinical and diagnostic findings, with no pain and elevated WBC, we will not plan to pursue surgical intervention at this time, especially after his incident on Friday after his EGD.  We will place him on zosyn.  Pending a cardiology evaluation and their recommendations, we may discussed with IR about the need for a perc chole drain.  Some of my concerns were relayed during my visit, but as some of these tests had not been completed, could only speculate with the patient and his wife.  We did discuss the possibility of a drain, so they are aware this is a possible treatment.  We will follow for now.  FEN - NPO/IVFs, may have clears from our standpoint if can tolerate VTE - none currently ID - [email or web address] 3/14 --[place] 04/12/2020, 1:51 PM Please see Amion for pager number during day hours 7:00am-4:30pm

## 2020-04-12 NOTE — Consult Note (Addendum)
Consultation  Referring Provider:   Dr. Antonieta Pert Primary Care Physician:  Colon Branch, MD Primary Gastroenterologist: Dr. Carlean Purl       Reason for Consultation: Intractable nausea and vomiting, weight loss, spitting up blood            HPI:   Kevin Baxter is a 50 y.o. male with a past medical history significant for hypertension, gout, anxiety/depression, morbid obesity, GERD, history of polyposis of his stomach and colonic polyps as well as reported esophageal dysphagia, who presented to the ER after being seen at [facility] for abdominal pain, nausea and vomiting for several weeks.    It is noted patient had a recent admission 1/27-1/30 for similar complaint and was treated for acute pancreatitis with a lipase of 82, slightly elevated troponin and a CT scan suggestive of acute pancreatitis.    Recent GI work-up as below with EGD 04/09/2020 with no cause for dysphagia and recent CT showing near resolution of pancreatitis.    Today, the patient explains that he has been having episodes of nausea and vomiting and not tolerating any food for over a couple of months.  Tells me his weight has dropped from around 333 pounds down to 295.  Tells me that ever since the EGD now he has been spitting up a globs of bright red blood.  This happens more times in the day than he can count and the last time was this morning.  Tells me that the vomiting has gotten slightly better since in the hospital on meds, but he continues with an epigastric "cramping".  Tells me he has not eaten anything since last Friday, 3 days ago, because of the nausea and has not had a bowel movement since then either.  He is able to keep down water and ginger ale.  Patient is slightly frustrated and wants to figure something out so that he can get better.    Denies fever, chills, melena or symptoms that awaken him from sleep.  ER course: Right upper quadrant ultrasound with no gallstones, sludge in the gallbladder  with gallbladder wall thickening with a questionable degree of acalculus cholecystitis, suggested nuc med hepatobiliary imaging study, cyst arising from the upper pole right kidney, otherwise unremarkable; tachycardic, mildly elevated AST and elevated ALT with elevated lipase, slightly positive troponin, ultrasound showing gallbladder sludge with wall thickening but no gallstone  GI history: 04/09/2020 EGD with no endoscopic esophageal abnormality to explain patient's dysphagia, multiple gastric polyps which were biopsied, normal examined duodenum ; plan: Barium swallow with tablet, await pathology, TSH (noted that he required mask ventilation nasal trumpet to facilitate breathing after the scope withdrawn and well recovering from sedation  04/09/2020 MRI of the brain with and without contrast done for altered mental status was negative 04/08/2020 CT the abdomen pelvis with contrast near complete resolution of acute pancreatitis since prior exam, no evidence of pancreatic pseudocyst or other complication, stable mild hepatic steatosis 04/06/2020 seen in the clinic by Dr. Carlean Purl for dysphagia and loss of weight and idiopathic acute pancreatitis without infection or necrosis, at that time there was concern given his significant weight loss and question if he had some type of gastrointestinal malignancy, plan was to get an EGD and a CT scan of the abdomen pelvis  Past Medical History:  Diagnosis Date  . Allergy   . Anxiety   . Arthritis   . Fundic gland polyposis of stomach   . GERD with  esophagitis   . Gout   . Hx of adenomatous colonic polyps   . Hypertension     Past Surgical History:  Procedure Laterality Date  . COLONOSCOPY  05/2019  . ESOPHAGOGASTRODUODENOSCOPY  05/2019  . HAND SURGERY Right    2006 for a FX    Family History  Problem Relation Age of Onset  . Cancer Mother        CERVICAL  . Hypertension Father   . Heart failure Father        no MI, d/t etoh  . Diabetes Sister   .  Diabetes Sister   . Diabetes Paternal Aunt   . Diabetes Cousin   . Colon cancer Neg Hx   . Prostate cancer Neg Hx   . Stroke Neg Hx   . Esophageal cancer Neg Hx   . Rectal cancer Neg Hx   . Stomach cancer Neg Hx     Social History   Tobacco Use  . Smoking status: Never Smoker  . Smokeless tobacco: Never Used  Vaping Use  . Vaping Use: Never used  Substance Use Topics  . Alcohol use: Yes    Comment: very rare   . Drug use: No    Prior to Admission medications   Medication Sig Start Date End Date Taking? Authorizing Provider  allopurinol (ZYLOPRIM) 300 MG tablet Take 1 tablet (300 mg total) by mouth daily. 12/22/19  Yes Paz, Alda Berthold, MD  buPROPion (WELLBUTRIN) 100 MG tablet Take 1 tablet a day x 1 week, then 1 tab BID Patient taking differently: Take 100 mg by mouth 2 (two) times daily. 03/02/20  Yes Paz, Alda Berthold, MD  dicyclomine (BENTYL) 20 MG tablet Take 1 tablet (20 mg total) by mouth every 6 (six) hours as needed for spasms. Patient taking differently: Take 20 mg by mouth daily. 04/22/19  Yes Gatha Mayer, MD  losartan (COZAAR) 50 MG tablet Take 2 tablets (100 mg total) by mouth daily. 04/08/20  Yes Paz, Alda Berthold, MD  ondansetron (ZOFRAN ODT) 4 MG disintegrating tablet Take 1 tablet (4 mg total) by mouth every 8 (eight) hours as needed for nausea or vomiting. 04/09/20  Yes Carmin Muskrat, MD  sucralfate (CARAFATE) 1 g tablet Take 1 tablet (1 g total) by mouth 4 (four) times daily -  with meals and at bedtime. Patient taking differently: Take 1 g by mouth daily. 04/09/20  Yes Carmin Muskrat, MD  pantoprazole (PROTONIX) 40 MG tablet Take 1 tablet (40 mg total) by mouth daily. Patient not taking: No sig reported 03/22/20   Shelda Pal, DO    Current Facility-Administered Medications  Medication Dose Route Frequency Provider Last Rate Last Admin  . acetaminophen (TYLENOL) tablet 650 mg  650 mg Oral Q6H PRN Kc, Ramesh, MD       Or  . acetaminophen (TYLENOL) suppository  650 mg  650 mg Rectal Q6H PRN Kc, Ramesh, MD      . allopurinol (ZYLOPRIM) tablet 300 mg  300 mg Oral Daily Kc, Ramesh, MD      . buPROPion (WELLBUTRIN) tablet 100 mg  100 mg Oral BID Kc, Ramesh, MD   100 mg at 04/11/20 2006  . dicyclomine (BENTYL) tablet 20 mg  20 mg Oral Q6H PRN Kc, Ramesh, MD      . hydrALAZINE (APRESOLINE) injection 5 mg  5 mg Intravenous Q6H PRN Blount, Lolita Cram, NP      . lactated ringers infusion   Intravenous Continuous Antonieta Pert, MD 100  mL/hr at 04/12/20 0613 New Bag at 04/12/20 6073  . metoCLOPramide (REGLAN) injection 5 mg  5 mg Intravenous Q6H Kc, Ramesh, MD   5 mg at 04/12/20 7106  . ondansetron (ZOFRAN) tablet 4 mg  4 mg Oral Q6H PRN Kc, Ramesh, MD       Or  . ondansetron (ZOFRAN) injection 4 mg  4 mg Intravenous Q6H PRN Kc, Ramesh, MD      . pantoprazole (PROTONIX) injection 40 mg  40 mg Intravenous Q12H Antonieta Pert, MD   40 mg at 04/11/20 2003  . promethazine (PHENERGAN) injection 6.25 mg  6.25 mg Intravenous Q6H PRN Kc, Ramesh, MD      . sucralfate (CARAFATE) tablet 1 g  1 g Oral TID WC & HS Kc, Ramesh, MD   1 g at 04/11/20 2005    Allergies as of 04/11/2020 - Review Complete 04/11/2020  Allergen Reaction Noted  . Pseudoephedrine  10/05/2006     Review of Systems:    Constitutional: No fever or chills Skin: No rash  Cardiovascular: No chest pain Respiratory: No SOB  Gastrointestinal: See HPI and otherwise negative Genitourinary: No dysuria  Neurological: No headache, dizziness or syncope Musculoskeletal: No new muscle or joint pain Hematologic: No bruising Psychiatric: No history of depression or anxiety    Physical Exam:  Vital signs in last 24 hours: Temp:  [97.3 F (36.3 C)-98.2 F (36.8 C)] 97.6 F (36.4 C) (03/14 0800) Pulse Rate:  [60-125] 110 (03/14 0800) Resp:  [15-33] 16 (03/14 0800) BP: (109-132)/(92-108) 122/99 (03/14 0800) SpO2:  [90 %-100 %] 90 % (03/14 0800) Weight:  [135.2 kg] 135.2 kg (03/13 1123) Last BM Date:  04/11/20 General:   Pleasant obese AA male appears to be in NAD, Well developed, Well nourished, alert and cooperative Head:  Normocephalic and atraumatic. Eyes:   PEERL, EOMI. No icterus. Conjunctiva pink. Ears:  Normal auditory acuity. Neck:  Supple Throat: Oral cavity and pharynx without inflammation, swelling or lesion.  Lungs: Respirations even and unlabored. Lungs clear to auscultation bilaterally.   No wheezes, crackles, or rhonchi.  Heart: Normal S1, S2. No MRG. Regular rate and rhythm. No peripheral edema, cyanosis or pallor.  Abdomen:  Soft, nondistended, nontender. No rebound or guarding. Normal bowel sounds. No appreciable masses or hepatomegaly. Rectal:  Not performed.  Msk:  Symmetrical without gross deformities. Peripheral pulses intact.  Extremities:  Without edema, no deformity or joint abnormality.  Neurologic:  Alert and  oriented x4;  grossly normal neurologically.  Skin:   Dry and intact without significant lesions or rashes. Psychiatric: Demonstrates good judgement and reason without abnormal affect or behaviors.   LAB RESULTS: Recent Labs    04/09/20 1301 04/11/20 1140 04/11/20 1841 04/12/20 0027 04/12/20 0752  WBC 6.9 6.9  --   --   --   HGB 16.0 16.2 16.2 16.0 15.8  HCT 50.8 50.2 51.2 51.4 50.4  PLT 175 146*  --   --   --    BMET Recent Labs    04/09/20 1301 04/11/20 1140 04/12/20 0027  NA 138 135 136  K 4.7 4.5 4.6  CL 105 102 105  CO2 23 21* 20*  GLUCOSE 119* 125* 120*  BUN 18 24* 23*  CREATININE 1.14 1.12 0.91  CALCIUM 8.3* 8.6* 8.5*   LFT Recent Labs    04/12/20 0027  PROT 6.6  ALBUMIN 2.9*  AST 144*  ALT 172*  ALKPHOS 59  BILITOT 4.0*    STUDIES: US Abdomen Limited RUQ (  LIVER/GB)  Result Date: 04/11/2020 CLINICAL DATA:  Pain with nausea and hemoptysis EXAM: ULTRASOUND ABDOMEN LIMITED RIGHT UPPER QUADRANT COMPARISON:  CT abdomen and pelvis April 08, 2020 FINDINGS: Gallbladder: There is sludge in the gallbladder. No gallstones  are evident. Gallbladder wall is thickened. There is no appreciable pericholecystic fluid. No sonographic Murphy sign noted by sonographer. Common bile duct: Diameter: 4 mm. No intrahepatic or extrahepatic biliary duct dilatation evident. Liver: No focal lesion identified. Within normal limits in parenchymal echogenicity. Portal vein is patent on color Doppler imaging with normal direction of blood flow towards the liver. Other: There is a cyst in the upper pole the right kidney measuring 1.9 x 1.7 x 1.5 cm. IMPRESSION: 1. No gallstones evident. There is sludge in the gallbladder with gallbladder wall thickening. Question a degree of acalculus cholecystitis. This finding may warrant nuclear medicine hepatobiliary imaging study to assess for cystic duct patency. 2. Cyst arising from upper pole right kidney measuring 1.9 x 1.7 x 1.5 cm. 3.  Study otherwise unremarkable. Electronically Signed   By: Lowella Grip Baxter M.D.   On: 04/11/2020 14:13    Impression / Plan:   Impression: 1.  Intractable nausea and vomiting with some spitting up blood: Nausea and vomiting for the past couple of months with inability to keep down solids, EGD unrevealing, CT recently showed resolving pancreatitis, ultrasound shows gallbladder sludge, now spitting blood since time of EGD-this could be from polyp biopsies, though hemoglobin stable; question relation to gallbladder etiology versus other 2.  Weight loss: About 40 pounds per patient over the last couple of months with his nausea and vomiting; consider relation to decreased p.o. intake versus other 3.  Elevated LFTs: Likely with gallbladder sludge 4.  Morbid obesity 5.  Mildly elevated troponin 6.  Elevated Lipase: question worsening pancreatitis  Plan: 1.  Agree with IV Pantoprazole 40 mg twice daily 2.  Patient to remain n.p.o. for now 3.  Agree with HIDA scan scheduled for today 4.  Patient may need a repeat EGD if he continues spitting up blood to discover  etiology.  Will discuss further with Dr. Tarri Glenn. 5.  Please await any further recommendations from Dr. Tarri Glenn later today.  Thank you for your kind consultation, we will continue to follow.  Lavone Nian Alwilda Gilland  04/12/2020, 8:52 AM   Addendum: 1:45 PM  HIDA scan positive for acute cholecystitis  Consulted general surgery who are going to see the patient today.  Ellouise Newer, PA-C

## 2020-04-12 NOTE — Consult Note (Addendum)
Cardiology Consultation:   Patient ID: Kevin Baxter MRN: [id number]; DOB: 04-01-1970  Admit date: 04/11/2020 Date of Consult: 04/12/2020  Primary Care Provider: Colon Branch, MD [facility] Cardiologist: NONE (NEW) [facility] Electrophysiologist:  None    Patient Profile:   Kevin Baxter is a 50 y.o. male with a hx of HTN, morbid obesity and GERD who is being seen today for the evaluation of LV dysfunction with apical LV thrombus at the request of Dr. Antonieta Pert.  History of Present Illness:   Kevin Baxter is a 50yo AAM with a hx of HTN, morbid obesity and GERD and new dx of preDM this admit.  He also has a hx of polyposis of the stomach and colonic polyps, esophageal dysphagia and several week hx of N/V.  Admitted in January with acute pancreatitis and mild Trop elevation at 30>33>40.  Had EGD on 3/11 for dysphagia and developed acute MS changes and negative MRI of the brain and discharged home.    Since then has had intractable N/V with hematemesis intermittently.  EGD was normal.  Seen in ER and was tachycardia with mildly elevated LFTs, elevated lipase and mildly elevated hsTrop(42>48>38) and BNP 1153.  Abd US showed GB sludge and wall thickening but no stones.  Per the wife, he has been confused off and on ever since the EGD, poor appetite and weight loss with dry heaves, N/V.  LFTs have continued to trend upward. Abd CT 04/08/20 showed complete resolution of acute pancreatitis with no evidence of pseydocyst.  No mention of coronary artery calcifications on Chest CT 01/2020. HIDA scan showed nonvisualization of the GB c/w acute cholecystitis.  2D echo was done due to elevated BNP and trop and showed severe LV dysfunction EF < 20% with diffuse severe HK and large apical LV thrombus.  There is also RV dysfunction as well.  He denies any hx of exertional chest pain or pressure, LE edema or syncope.  He tells me that he had a cold back in December with bad cough but tested negative  for COVID 19 with 6 tests.  After that is when his sx of abdominal bloating, N/V, lack of appetite and weight loss started.  He says that he starting having SOB but only when his abdomen would swell.  He has never smoked.  His father has a hx of CHF(Dad was a heavy drinker).  His father's side of the family has heart disease but he is unsure of the exact dx.  There is no fm hx of SCD.     Past Medical History:  Diagnosis Date  . Allergy   . Anxiety   . Arthritis   . Fundic gland polyposis of stomach   . GERD with esophagitis   . Gout   . Hx of adenomatous colonic polyps   . Hypertension     Past Surgical History:  Procedure Laterality Date  . COLONOSCOPY  05/2019  . ESOPHAGOGASTRODUODENOSCOPY  05/2019  . HAND SURGERY Right    2006 for a FX     Home Medications:  Prior to Admission medications   Medication Sig Start Date End Date Taking? Authorizing Provider  allopurinol (ZYLOPRIM) 300 MG tablet Take 1 tablet (300 mg total) by mouth daily. 12/22/19  Yes Paz, Alda Berthold, MD  buPROPion (WELLBUTRIN) 100 MG tablet Take 1 tablet a day x 1 week, then 1 tab BID Patient taking differently: Take 100 mg by mouth 2 (two) times daily. 03/02/20  Yes Paz,  Alda Berthold, MD  dicyclomine (BENTYL) 20 MG tablet Take 1 tablet (20 mg total) by mouth every 6 (six) hours as needed for spasms. Patient taking differently: Take 20 mg by mouth daily. 04/22/19  Yes Gatha Mayer, MD  losartan (COZAAR) 50 MG tablet Take 2 tablets (100 mg total) by mouth daily. 04/08/20  Yes Paz, Alda Berthold, MD  ondansetron (ZOFRAN ODT) 4 MG disintegrating tablet Take 1 tablet (4 mg total) by mouth every 8 (eight) hours as needed for nausea or vomiting. 04/09/20  Yes Carmin Muskrat, MD  sucralfate (CARAFATE) 1 g tablet Take 1 tablet (1 g total) by mouth 4 (four) times daily -  with meals and at bedtime. Patient taking differently: Take 1 g by mouth daily. 04/09/20  Yes Carmin Muskrat, MD  pantoprazole (PROTONIX) 40 MG tablet Take 1 tablet (40  mg total) by mouth daily. Patient not taking: No sig reported 03/22/20   Shelda Pal, DO    Inpatient Medications: Scheduled Meds: . allopurinol  300 mg Oral Daily  . buPROPion  100 mg Oral BID  . heparin  3,300 Units Intravenous STAT  . metoCLOPramide (REGLAN) injection  5 mg Intravenous Q6H  . morphine      . pantoprazole (PROTONIX) IV  40 mg Intravenous Q12H  . sucralfate  1 g Oral TID WC & HS   Continuous Infusions: . heparin    . piperacillin-tazobactam (ZOSYN)  IV    . promethazine (PHENERGAN) injection     PRN Meds: acetaminophen **OR** acetaminophen, dicyclomine, hydrALAZINE, ondansetron **OR** ondansetron (ZOFRAN) IV, promethazine (PHENERGAN) injection, technetium TC 73M mebrofenin  Allergies:    Allergies  Allergen Reactions  . Pseudoephedrine     REACTION: jittery    Social History:   Social History   Socioeconomic History  . Marital status: Married    Spouse name: Not on file  . Number of children: 2  . Years of education: Not on file  . Highest education level: Not on file  Occupational History  . Occupation: works for [employer]  . Smoking status: Never Smoker  . Smokeless tobacco: Never Used  Vaping Use  . Vaping Use: Never used  Substance and Sexual Activity  . Alcohol use: Yes    Comment: very rare   . Drug use: No  . Sexual activity: Not on file  Other Topics Concern  . Not on file  Social History Narrative   Household: pt, wife daughter (2013),  boy  (2006),    Social Determinants of Radio broadcast assistant Strain: Not on file  Food Insecurity: Not on file  Transportation Needs: Not on file  Physical Activity: Not on file  Stress: Not on file  Social Connections: Not on file  Intimate Partner Violence: Not on file    Family History:    Family History  Problem Relation Age of Onset  . Cancer Mother        CERVICAL  . Hypertension Father   . Heart failure Father        no MI, d/t etoh  . Diabetes Sister    . Diabetes Sister   . Diabetes Paternal Aunt   . Diabetes Cousin   . Colon cancer Neg Hx   . Prostate cancer Neg Hx   . Stroke Neg Hx   . Esophageal cancer Neg Hx   . Rectal cancer Neg Hx   . Stomach cancer Neg Hx      ROS:  Please see the  history of present illness.   All other ROS reviewed and negative.     Physical Exam/Data:   Vitals:   04/11/20 2144 04/12/20 0155 04/12/20 0523 04/12/20 0800  BP: (!) 109/92 (!) 123/105 (!) 119/97 (!) 122/99  Pulse: 68 95 63 (!) 110  Resp: 20 15 18 16   Temp: (!) 97.5 F (36.4 C) (!) 97.3 F (36.3 C) 98.2 F (36.8 C) 97.6 F (36.4 C)  TempSrc: Oral Oral Oral Oral  SpO2: 99% 97% 98% 90%  Weight:      Height:        Intake/Output Summary (Last 24 hours) at 04/12/2020 1558 Last data filed at 04/12/2020 0900 Gross per 24 hour  Intake 1136.03 ml  Output 400 ml  Net 736.03 ml   Last 3 Weights 04/11/2020 04/09/2020 04/09/2020  Weight (lbs) 298 lb 295 lb 301 lb  Weight (kg) 135.172 kg 133.811 kg 136.533 kg     Body mass index is 39.32 kg/m.  General:  Well nourished, well developed, in no acute distress HEENT: normal Lymph: no adenopathy Neck: no JVD Endocrine:  No thryomegaly Vascular: No carotid bruits; FA pulses 2+ bilaterally without bruits  Cardiac:  normal S1, S2; RRR; no murmur, regular rhythm and tachycardic Lungs:  clear to auscultation bilaterally, no wheezing, rhonchi or rales  Abd: soft, nontender, no hepatomegaly  Ext: no edema Musculoskeletal:  No deformities, BUE and BLE strength normal and equal Skin: warm and dry  Neuro:  CNs 2-12 intact, no focal abnormalities noted Psych:  Normal affect   EKG:  The EKG was personally reviewed and demonstrates:  NSR with LAFB on all EKGs except yesterday where it show complete reversal of axis and possible LPFB>>suspect that EKG yesterday had arm lead reversal Telemetry:  Telemetry was personally reviewed and demonstrates:  NSR  Relevant CV Studies: 2D echo  04/12/2020 IMPRESSIONS   1. There is a large apical thromus measuring 3.6 x 2.3 cm . Left ventricular ejection fraction, by estimation, is <20%. The left  ventricle has severely decreased function. The left ventricle demonstrates  regional wall motion abnormalities (see scoring diagram/findings for  description). The left ventricular  internal cavity size was moderately to severely dilated. Left ventricular  diastolic function could not be evaluated. There is severe akinesis of the  left ventricular, mid-apical apical segment, anteroseptal wall,  inferoseptal wall and lateral wall.  2. Right ventricular systolic function was not well visualized. The right  ventricular size is not well visualized.  3. The mitral valve is grossly normal. No evidence of mitral valve  regurgitation.  4. The aortic valve is normal in structure. Aortic valve regurgitation is  not visualized. No aortic stenosis is present.  5. Aortic dilatation noted. There is borderline dilatation of the  ascending aorta, measuring 39 mm.   Laboratory Data:  High Sensitivity Troponin:   Recent Labs  Lab 04/11/20 1140 04/11/20 1841 04/11/20 2111  TROPONINIHS 42* 48* 38*     Chemistry Recent Labs  Lab 04/09/20 1301 04/11/20 1140 04/12/20 0027  NA 138 135 136  K 4.7 4.5 4.6  CL 105 102 105  CO2 23 21* 20*  GLUCOSE 119* 125* 120*  BUN 18 24* 23*  CREATININE 1.14 1.12 0.91  CALCIUM 8.3* 8.6* 8.5*  GFRNONAA >60 >60 >60  ANIONGAP 10 12 11     Recent Labs  Lab 04/09/20 1301 04/11/20 1140 04/12/20 0027  PROT 6.3* 6.8 6.6  ALBUMIN 2.7* 2.9* 2.9*  AST 40 120* 144*  ALT 69* 134*  172*  ALKPHOS 61 64 59  BILITOT 3.3* 3.9* 4.0*   Hematology Recent Labs  Lab 04/09/20 1301 04/11/20 1140 04/11/20 1841 04/12/20 0027 04/12/20 0752  WBC 6.9 6.9  --   --   --   RBC 6.31* 6.32*  --   --   --   HGB 16.0 16.2 16.2 16.0 15.8  HCT 50.8 50.2 51.2 51.4 50.4  MCV 80.5 79.4*  --   --   --   MCH 25.4* 25.6*  --    --   --   MCHC 31.5 32.3  --   --   --   RDW 17.1* 17.6*  --   --   --   PLT 175 146*  --   --   --    BNP Recent Labs  Lab 04/12/20 1216  BNP 1,153.9*    DDimer No results for input(s): DDIMER in the last 168 hours.   Radiology/Studies:  MR Brain W and Wo Contrast  Result Date: 04/09/2020 CLINICAL DATA:  Altered mental status. EXAM: MRI HEAD WITHOUT AND WITH CONTRAST TECHNIQUE: Multiplanar, multiecho pulse sequences of the brain and surrounding structures were obtained without and with intravenous contrast. CONTRAST:  49mL GADAVIST GADOBUTROL 1 MMOL/ML IV SOLN COMPARISON:  None. FINDINGS: Brain: No acute infarction, hemorrhage, hydrocephalus, extra-axial collection or mass lesion. Normal white matter. Normal enhancement postcontrast administration Image quality degraded by mild motion. Vascular: Normal arterial flow voids. Skull and upper cervical spine: No focal skeletal lesion. Sinuses/Orbits: Paranasal sinuses clear.  Negative orbit Other: None IMPRESSION: Negative MRI of the head with contrast. Mild motion degrades image quality Electronically Signed   By: Franchot Gallo M.D.   On: 04/09/2020 16:42   NM Hepatobiliary Liver Func  Result Date: 04/12/2020 CLINICAL DATA:  Evaluate for cholecystitis. Nausea and vomiting. Right upper quadrant pain. EXAM: NUCLEAR MEDICINE HEPATOBILIARY IMAGING TECHNIQUE: Sequential images of the abdomen were obtained out to 60 minutes following intravenous administration of radiopharmaceutical. RADIOPHARMACEUTICALS:  7.5 mCi Tc-55m  Choletec IV COMPARISON:  Abdominal sonogram 04/11/2020 FINDINGS: Prompt uptake and biliary excretion of activity by the liver is seen. Biliary activity passes into small bowel, consistent with patent common bile duct. After 60 minutes no gallbladder activity was visualized. The patient subsequently received 3 mg of morphine, IV. Imaging was performed for an additional 30 minutes without evidence for gallbladder activity. IMPRESSION: 1.  Nonvisualization of the gallbladder which is compatible with acute cholecystitis. Electronically Signed   By: Kerby Moors M.D.   On: 04/12/2020 13:00   DG Chest Port 1 View  Result Date: 04/12/2020 CLINICAL DATA:  Evaluate for congestive heart failure. EXAM: PORTABLE CHEST 1 VIEW COMPARISON:  April 01, 2020 FINDINGS: The cardiac silhouette is markedly enlarged and unchanged in size. Both lungs are clear. The visualized skeletal structures are unremarkable. IMPRESSION: 1. Cardiomegaly without acute or active cardiopulmonary disease. Electronically Signed   By: Virgina Norfolk M.D.   On: 04/12/2020 15:33   ECHOCARDIOGRAM COMPLETE  Result Date: 04/12/2020    ECHOCARDIOGRAM REPORT   Patient Name:   KENDALL JUSTO Baxter Date of Exam: 04/12/2020 Medical Rec #:  [id number]          Height:       73.0 in Accession #:    [id number]         Weight:       298.0 lb Date of Birth:  1970/10/01         BSA:  2.549 m Patient Age:    37 years           BP:           122/99 mmHg Patient Gender: M                  HR:           115 bpm. Exam Location:  Inpatient Procedure: 2D Echo, Cardiac Doppler, Color Doppler and Intracardiac            Opacification Agent                       STAT ECHO Reported to: Dr Mertie Moores on 04/12/2020 2:13:00 PM. Indications:    Elevated Troponin  History:        Patient has no prior history of Echocardiogram examinations.                 Risk Factors:Hypertension and GERD.  Sonographer:    Jonelle Sidle Dance Referring Phys: [id number] [place] Ken Caryl IMPRESSIONS  1. There is a large apical thromus measuring 3.6 x 2.3 cm .     Marland Kitchen Left ventricular ejection fraction, by estimation, is <20%. The left ventricle has severely decreased function. The left ventricle demonstrates regional wall motion abnormalities (see scoring diagram/findings for description). The left ventricular internal cavity size was moderately to severely dilated. Left ventricular diastolic function could not be evaluated. There is  severe akinesis of the left ventricular, mid-apical apical segment, anteroseptal wall, inferoseptal wall and lateral wall.  2. Right ventricular systolic function was not well visualized. The right ventricular size is not well visualized.  3. The mitral valve is grossly normal. No evidence of mitral valve regurgitation.  4. The aortic valve is normal in structure. Aortic valve regurgitation is not visualized. No aortic stenosis is present.  5. Aortic dilatation noted. There is borderline dilatation of the ascending aorta, measuring 39 mm. FINDINGS  Left Ventricle: There is a large apical thromus measuring 3.6 x 2.3 cm. Left ventricular ejection fraction, by estimation, is <20%. The left ventricle has severely decreased function. The left ventricle demonstrates regional wall motion abnormalities. Severe akinesis of the left ventricular, mid-apical apical segment, anteroseptal wall, inferoseptal wall and lateral wall. Definity contrast agent was given IV to delineate the left ventricular endocardial borders. The left ventricular internal cavity size was moderately to severely dilated. There is no left ventricular hypertrophy. Left ventricular diastolic function could not be evaluated. Right Ventricle: The right ventricular size is not well visualized. Right vetricular wall thickness was not well visualized. Right ventricular systolic function was not well visualized. Left Atrium: Left atrial size was not well visualized. Right Atrium: Right atrial size was not well visualized. Pericardium: There is no evidence of pericardial effusion. Mitral Valve: The mitral valve is grossly normal. No evidence of mitral valve regurgitation. Tricuspid Valve: The tricuspid valve is normal in structure. Tricuspid valve regurgitation is mild. Aortic Valve: The aortic valve is normal in structure. Aortic valve regurgitation is not visualized. No aortic stenosis is present. Pulmonic Valve: The pulmonic valve was normal in structure.  Pulmonic valve regurgitation is not visualized. Aorta: Aortic dilatation noted. There is borderline dilatation of the ascending aorta, measuring 39 mm. IAS/Shunts: The atrial septum is grossly normal.  LEFT VENTRICLE PLAX 2D LVIDd:         6.23 cm  Diastology LVIDs:         5.59 cm  LV e' medial:    5.84  cm/s LV PW:         1.38 cm  LV E/e' medial:  14.0 LV IVS:        0.94 cm  LV e' lateral:   6.48 cm/s LVOT diam:     2.30 cm  LV E/e' lateral: 12.6 LV SV:         23 LV SV Index:   9 LVOT Area:     4.15 cm  RIGHT VENTRICLE             IVC RV Basal diam:  3.49 cm     IVC diam: 2.50 cm RV Mid diam:    2.29 cm RV S prime:     10.30 cm/s TAPSE (M-mode): 1.8 cm LEFT ATRIUM         Index LA diam:    4.60 cm 1.80 cm/m  AORTIC VALVE LVOT Vmax:   46.50 cm/s LVOT Vmean:  30.100 cm/s LVOT VTI:    0.055 m  AORTA Ao Root diam: 3.40 cm Ao Asc diam:  3.90 cm MITRAL VALVE MV Area (PHT): 4.70 cm    SHUNTS MV Decel Time: 162 msec    Systemic VTI:  0.05 m MV E velocity: 81.70 cm/s  Systemic Diam: 2.30 cm MV A velocity: 30.60 cm/s MV E/A ratio:  2.67 Mertie Moores MD Electronically signed by Mertie Moores MD Signature Date/Time: 04/12/2020/2:24:59 PM    Final    VAS [place] LOWER EXTREMITY VENOUS (DVT)  Result Date: 04/12/2020  Lower Venous DVT Study Indications: Swelling.  Risk Factors: None identified. Limitations: Body habitus and poor ultrasound/tissue interface. Comparison Study: No prior studies. Performing Technologist: Oliver Hum RVT  Examination Guidelines: A complete evaluation includes B-mode imaging, spectral Doppler, color Doppler, and power Doppler as needed of all accessible portions of each vessel. Bilateral testing is considered an integral part of a complete examination. Limited examinations for reoccurring indications may be performed as noted. The reflux portion of the exam is performed with the patient in reverse Trendelenburg.  +---------+---------------+---------+-----------+----------+-------------------+  RIGHT    CompressibilityPhasicitySpontaneityPropertiesThrombus Aging      +---------+---------------+---------+-----------+----------+-------------------+ CFV      Full           No       No                                       +---------+---------------+---------+-----------+----------+-------------------+ SFJ      Full                                                             +---------+---------------+---------+-----------+----------+-------------------+ FV Prox  Full                                                             +---------+---------------+---------+-----------+----------+-------------------+ FV Mid   None           No       No                   Acute               +---------+---------------+---------+-----------+----------+-------------------+  FV DistalNone           No       No                   Acute               +---------+---------------+---------+-----------+----------+-------------------+ PFV      Full                                                             +---------+---------------+---------+-----------+----------+-------------------+ POP      Full           Yes      Yes                                      +---------+---------------+---------+-----------+----------+-------------------+ PTV      Full                                                             +---------+---------------+---------+-----------+----------+-------------------+ PERO                                                  Not well visualized +---------+---------------+---------+-----------+----------+-------------------+   +---------+---------------+---------+-----------+----------+--------------+ LEFT     CompressibilityPhasicitySpontaneityPropertiesThrombus Aging +---------+---------------+---------+-----------+----------+--------------+ CFV      Full           Yes      Yes                                  +---------+---------------+---------+-----------+----------+--------------+ SFJ      Full                                                        +---------+---------------+---------+-----------+----------+--------------+ FV Prox  Full                                                        +---------+---------------+---------+-----------+----------+--------------+ FV Mid   Full                                                        +---------+---------------+---------+-----------+----------+--------------+ FV DistalFull                                                        +---------+---------------+---------+-----------+----------+--------------+  PFV      Full                                                        +---------+---------------+---------+-----------+----------+--------------+ POP      Full           Yes      Yes                                 +---------+---------------+---------+-----------+----------+--------------+ PTV      Full                                                        +---------+---------------+---------+-----------+----------+--------------+ PERO     Full                                                        +---------+---------------+---------+-----------+----------+--------------+     Summary: RIGHT: - Findings consistent with acute deep vein thrombosis involving the right femoral vein. - No cystic structure found in the popliteal fossa.  LEFT: - There is no evidence of deep vein thrombosis in the lower extremity. However, portions of this examination were limited- see technologist comments above.  - No cystic structure found in the popliteal fossa.  *See table(s) above for measurements and observations.    Preliminary    US Abdomen Limited RUQ (LIVER/GB)  Result Date: 04/11/2020 CLINICAL DATA:  Pain with nausea and hemoptysis EXAM: ULTRASOUND ABDOMEN LIMITED RIGHT UPPER QUADRANT COMPARISON:  CT abdomen and pelvis April 08, 2020 FINDINGS: Gallbladder: There is sludge in the gallbladder. No gallstones are evident. Gallbladder wall is thickened. There is no appreciable pericholecystic fluid. No sonographic Murphy sign noted by sonographer. Common bile duct: Diameter: 4 mm. No intrahepatic or extrahepatic biliary duct dilatation evident. Liver: No focal lesion identified. Within normal limits in parenchymal echogenicity. Portal vein is patent on color Doppler imaging with normal direction of blood flow towards the liver. Other: There is a cyst in the upper pole the right kidney measuring 1.9 x 1.7 x 1.5 cm. IMPRESSION: 1. No gallstones evident. There is sludge in the gallbladder with gallbladder wall thickening. Question a degree of acalculus cholecystitis. This finding may warrant nuclear medicine hepatobiliary imaging study to assess for cystic duct patency. 2. Cyst arising from upper pole right kidney measuring 1.9 x 1.7 x 1.5 cm. 3.  Study otherwise unremarkable. Electronically Signed   By: Lowella Grip Baxter M.D.   On: 04/11/2020 14:13     Assessment and Plan:   Large LV apical thrombus -2D echo demonstrates a 3.6 cm x 2.3 cm apical mural thrombus in setting of severe LV dysfunction -he has had some recent CNS symptoms 3/11 after EGD and head MRI showing no acute infarct -will start IV heparin gtt -TRH following   Severe LV dysfunction -unclear etiology -I personally reviewed echo and there appears to be biventricular failure>>RV not well visualized but in parasternal long axis  view the RVF appears reduced and LVF severely reduced < 20% -he is not a smoker and his only CRFs are morbid obesity, HTN and newly dx preDM (HbA1C 6.3%)>> no mention of coronary artery calcifications on chest CTA and with global LV dysfunction and RV involvement, suspect this is not CAD. -He had cold sx in December right before he started having N/V/wt loss/decreased appetite/abdominal swelling (tested neg for COVID 19 on multiple  occasions>>not vaccinated)>> suspect he has a Viral etiology for DCM -there is no fm hx of CAD but his father had a DCM with CHF (he was an alcoholic)>>his father's side of family had heart disease but he is unsure the exact dx>>no hx of SCD -COVID 19 neg and HIV neg this admit -will check Ferritin level and SPEP/UPEP (doubt TTRY amyloid) -he does not appear overtly volume overloaded on exam but with tachycardia and soft SBP, I am very concerned that he has low output cardiogenic shock>>will wait to get CVP and Co ox readings before adding diuretics/inotropes -He was on Losartan 100mg  daily at home but this was held due to N/V and soft SBP -he will ultimately need right heart cath and coronary angiography at some point once stabilized -will transfer to [facility] [place] service -consult AHF service -will place PICC line to get CVP and Co-Ox>>will get this done prior to transfer to [facility] -his BNP is elevated but Cxray did not show any edema and he has no LE edema, abdominal distension and lungs are clear.  -he is tachycardic likely related to cardiogenic shock so would NOT use BB at this time given his severe LV dysfunction as elevated HR is likely compensatory -he has underlying thrombocytosis with HCT 51.2>>needs outpt Sleep study to rule out OSA  HTN -low pulse pressure with elevated DBP related to shock -he was on Losartan 100mg  daily at home which has been held for N/V  Acute cholecystitis -has had N/V for weeks with over a 30lb weight loss -had pancreatitis in January >> completely resolved on followup CT -Abd [place] with sludge and GB thickening and HIDDA scan + non visualized GB c/w  acute cholecystitis -at this time cardiac status very tenuous and high risk for GB surgery  I have spent a total of 60 minutes with patient reviewing 2D echo images, chest CTA, abdominal CT, Head MRI , telemetry, EKGs, labs and examining patient as well as establishing an assessment and plan that was discussed with the  patient.  > 50% of time was spent in direct patient care.     :  For questions or updates, please contact [place] Please consult [email or web address] for contact info under    Signed, Fransico Him, MD  04/12/2020 3:58 PM

## 2020-04-12 NOTE — Progress Notes (Signed)
Bilateral lower extremity venous duplex has been completed. Preliminary results can be found in CV Proc through chart review.  Results were given to Dr. Maren Beach.  04/12/20 3:43 PM Kevin Baxter RVT

## 2020-04-12 NOTE — Progress Notes (Signed)
Pharmacy Antibiotic Note  Kevin Baxter is a 50 y.o. male admitted on 04/11/2020 with intra-abdominal infection.  Pharmacy has been consulted for Zosyn dosing.  Plan: Zosyn 3.375g IV Q8H infused over 4hrs. Follow up renal function, culture results, and clinical course.   Height: 6\' 1"  (185.4 cm) Weight: 135.2 kg (298 lb) IBW/kg (Calculated) : 79.9  Temp (24hrs), Avg:97.7 F (36.5 C), Min:97.3 F (36.3 C), Max:98.2 F (36.8 C)  Recent Labs  Lab 04/09/20 1301 04/11/20 1140 04/11/20 1841 04/11/20 2111 04/12/20 0027  WBC 6.9 6.9  --   --   --   CREATININE 1.14 1.12  --   --  0.91  LATICACIDVEN  --   --  2.2* 2.6*  --     Estimated Creatinine Clearance: 138.6 mL/min (by C-G formula based on SCr of 0.91 mg/dL).    Allergies  Allergen Reactions  . Pseudoephedrine     REACTION: jittery    Antimicrobials this admission: 3/14 Zosyn >>   Dose adjustments this admission:  Microbiology results:  Thank you for allowing pharmacy to be a part of this patient's care.  Gretta Arab PharmD, BCPS Clinical Pharmacist [facility] main pharmacy [phone] 04/12/2020 3:07 PM

## 2020-04-12 NOTE — Progress Notes (Addendum)
PROGRESS NOTE    Kevin Baxter  IHK:[id number] DOB: [birth year] DOA: 04/11/2020 PCP: Colon Branch, MD   Chief Complaint  Patient presents with  . Nausea  Brief Narrative: 50 y.o. male with medical history significant for hypertension, gout, anxiety/depression, morbid obesity, GERD, history of polyposis of his stomach and colonic polyps, reported esophageal dysphagia who has had abdominal pain nausea and vomiting going on for several weeks Recent admission from 1/27-1/30 for similar complaint and treated for acute pancreatitis-with lipase at 82 slightly elevated troponin and CT scan abdomen suggestive of acute pancreatitis and pancreatic tail. Patient was having further work-up underwent EGD 04/09/20 which did not reveal cause of the dysphagia, but following EGD patient had episode of altered mental status and strokelike symptoms and had extensive evaluation in the ED with negative MRI brain and discharged home. Since discharge she has been having intractable nausea vomiting and spitting blood and not tolerating diet- On-call GI was called by patient's wife around 1 AM to report of his symptoms and advised to come to the ED. In the ED patient was tachycardic blood work showed mildly with AST ALT, elevated lipase slightly positive troponin ultrasound abdomen showed gallbladder sludge with wall thickening but no gallstone.  Patient was not symptomatic and subsequently [facility] called for admission and brought to [facility]. Patient was seen in room 1607 on my evaluation is alert awake oriented x3 Wife is at the bedside She endorses that ever since the endoscopy he is sometimes confused, has lost weight has not eaten of any food, has had dry heaving nausea vomiting and diarrhea. Patient otherwise denies fever cough, chest pain back pain flank pain dysuria, focal weakness double vision    Subjective:  Overnight having some nausea no vomiting reports he has been spitting up blood  intermittently No abdominal pain.  Assessment & Plan: Intractable nausea and vomiting spitting UP blood-since egd 3/11 Dehydration 2/2 #1-with ketosis in the urine GERD Lactic acidosis most likely due to dehydration Has been dealing with chronic nausea vomiting-recent admission in January with similar presentation elevated lipase to his acute pancreatitis-CT on 3/10 near complete resolution of acute pancreatitis,had EGD 3/11-multiple gastric polyps were biopsied normal duodenum esophagus and barium swallow tablet was plan and symptom worsened since EGD. Cont PPI twice daily, scheduled Reglan, ivf.Hemoglobin has been stable no recurrent vomiting.  GI has been consulted, HIDA scan pending but no significant abdominal tenderness on the right side or pain.  Lipase elevated unclear if acute pancreatitis versus nausea vomiting and dehydration related- cont on supportive care-ivf,npo,monitor lipase,amylase pending.??Gastroparesis given chronicity of his problem, of note HbA1c is at prediabetes level 6.3- new.  Await further GI recommendations and plan.   Dysphagia/weight loss 40lb past couple pf months followed by Dr. Carlean Purl as OP and had EGD on 3/11, GI consulted here.  Abnormal AST ALT and bilirubin: Ultrasound gallbladder reviewed.   AST/ALT remains elevated 145/173 with total bili 4.  Check acute viral panel, GI input pending  Mildly elevated troponin no chest pain, sinus tachycardia on EKG nonischemic T wave changes.  Troponin down trended 42> 48>38, again no chest pain.  Likely in the setting of his tachycardia and dehydration.  Obtain echocardiogram routine.  Prediabetes hemoglobin A1c 6.3, new onset-will need outpatient PCP follow-up dietary education  Hypertension:  BP is well controlled.holding losartan.  Anxiety/depression  mood is stable, continue Wellbutrin  History of gout:on allopurinol.  Morbid obesity BMI 39:will benefit with weight loss and outpatient follow-up.  Episode  of Altered mental status after EGD on 3/11/:? Anesthesia side effects.patient is currently alert awake oriented x3.B12 n normal B1 pending TSH 1.3 on 3/11.  He had MRI 3/11 in the ED mild motion degraded but no acute finding.  Acute systolic CHF EF less than 20% Large apical thrombus 3.6x2.3 cm Acute right femoral DVT-rt leg swelling Pt was reexamined. Not in distress-no shortness of breath no chest pain no vomiting. Reviewed echo and ultrasound finding-BNP added on and is elevated 1153, repeated chest x-ray lungs are clear, CHF is compensated,?  Etiology-Consulted and discussed with Dr. Lovena Le from cardiology-who has arrived urgently at the bedside on my request and we discussed- recommendations for heparin drip, transfer to [facility], avoid ivf. Discussed with the patient's and wife at the bedside explained risks/benefits and okay to proceed anticoagulation, he has had spitting of blood but hemoglobin has been stable and we will need to monitor closely. Given patient's episode of altered mental status visual changes post EGD on 3/11 and wife endorsing intermittent confusion at home-I discussed with Dr. Leonel Ramsay from neurology- recommends to start IV thiamine 1 mg daily as patient has been nauseous vomiting and could get thiamine deficiency-also get CT head w/o contrast prior to starting heparin.   Diet Order            Diet clear liquid Room service appropriate? Yes; Fluid consistency: Thin  Diet effective now                 Patient's Body mass index is 39.32 kg/m.  DVT prophylaxis: SCDs Start: 04/11/20 1816 Code Status:   Code Status: Full Code  Family Communication: plan of care discussed with patient at bedside.  Status is: admitted as Observation Remains hospitalized for ongoing nausea inability to tolerate diet, and with new finding of acute DVT, CHF severe systolic and mural thrombosis will change to inpatient.  Dispo: The patient is from: Home              Anticipated d/c is  to: Home  1-2 days              Patient currently is not medically stable to d/c.   Difficult to place patient No  Unresulted Labs (From admission, onward)          Start     Ordered   04/11/20 1821  Vitamin B1  Once,   R        04/11/20 1820   04/11/20 1820  Lipase, blood  Daily,   R      04/11/20 1819   04/11/20 1816  Hemoglobin and hematocrit, blood  Every 6 hours,   R (with TIMED occurrences)      04/11/20 1819        Medications reviewed:  Scheduled Meds: . allopurinol  300 mg Oral Daily  . buPROPion  100 mg Oral BID  . metoCLOPramide (REGLAN) injection  5 mg Intravenous Q6H  . pantoprazole (PROTONIX) IV  40 mg Intravenous Q12H  . sucralfate  1 g Oral TID WC & HS   Continuous Infusions: . lactated ringers 100 mL/hr at 04/12/20 7902    Consultants: GI Procedures:see note  Antimicrobials: Anti-infectives (From admission, onward)   None     Culture/Microbiology No results found for: SDES, SPECREQUEST, CULT, REPTSTATUS  Other culture-see note  Objective: Vitals: Today's Vitals   04/11/20 2144 04/12/20 0155 04/12/20 0523 04/12/20 0800  BP: (!) 109/92 (!) 123/105 (!) 119/97 (!) 122/99  Pulse: 68 95 63 Marland Kitchen)  110  Resp: 20 15 18 16   Temp: (!) 97.5 F (36.4 C) (!) 97.3 F (36.3 C) 98.2 F (36.8 C) 97.6 F (36.4 C)  TempSrc: Oral Oral Oral Oral  SpO2: 99% 97% 98% 90%  Weight:      Height:      PainSc:        Intake/Output Summary (Last 24 hours) at 04/12/2020 1052 Last data filed at 04/12/2020 0900 Gross per 24 hour  Intake 3145.32 ml  Output 400 ml  Net 2745.32 ml   Filed Weights   04/11/20 1123  Weight: 135.2 kg   Weight change:   Intake/Output from previous day: 03/13 0701 - 03/14 0700 In: 2995.3 [P.O.:240; I.V.:746; IV Piggyback:2009.3] Out: 400 [Urine:400] Intake/Output this shift: Total I/O In: 150 [P.O.:150] Out: -  Filed Weights   04/11/20 1123  Weight: 135.2 kg    Examination: General exam: AAO x3, obese,NAD,weak  appearing. HEENT:Oral mucosa moist, Ear/Nose WNL grossly,dentition normal. Respiratory system: bilaterally diminished,no use of accessory muscle, non tender. Cardiovascular system: S1 & S2 +, regular, No JVD. Gastrointestinal system: Abdomen soft, NT,ND,BS+. Nervous System:Alert, awake, moving extremities and grossly nonfocal Extremities: rt leg edema,distal peripheral pulses palpable.  Skin: No rashes,no icterus. MSK: Normal muscle bulk,tone, power  Data Reviewed: I have personally reviewed following labs and imaging studies CBC: Recent Labs  Lab 04/09/20 1301 04/11/20 1140 04/11/20 1841 04/12/20 0027 04/12/20 0752  WBC 6.9 6.9  --   --   --   NEUTROABS 5.6 4.9  --   --   --   HGB 16.0 16.2 16.2 16.0 15.8  HCT 50.8 50.2 51.2 51.4 50.4  MCV 80.5 79.4*  --   --   --   PLT 175 146*  --   --   --    Basic Metabolic Panel: Recent Labs  Lab 04/09/20 1301 04/11/20 1140 04/12/20 0027  NA 138 135 136  K 4.7 4.5 4.6  CL 105 102 105  CO2 23 21* 20*  GLUCOSE 119* 125* 120*  BUN 18 24* 23*  CREATININE 1.14 1.12 0.91  CALCIUM 8.3* 8.6* 8.5*  MG 2.0 2.0  --    GFR: Estimated Creatinine Clearance: 138.6 mL/min (by C-G formula based on SCr of 0.91 mg/dL). Liver Function Tests: Recent Labs  Lab 04/09/20 1301 04/11/20 1140 04/12/20 0027  AST 40 120* 144*  ALT 69* 134* 172*  ALKPHOS 61 64 59  BILITOT 3.3* 3.9* 4.0*  PROT 6.3* 6.8 6.6  ALBUMIN 2.7* 2.9* 2.9*   Recent Labs  Lab 04/11/20 1140 04/11/20 1841 04/12/20 0027  LIPASE 107* 135* 155*   No results for input(s): AMMONIA in the last 168 hours. Coagulation Profile: No results for input(s): INR, PROTIME in the last 168 hours. Cardiac Enzymes: No results for input(s): CKTOTAL, CKMB, CKMBINDEX, TROPONINI in the last 168 hours. BNP (last 3 results) No results for input(s): PROBNP in the last 8760 hours. HbA1C: Recent Labs    04/11/20 1844  HGBA1C 6.3*   CBG: No results for input(s): GLUCAP in the last 168  hours. Lipid Profile: No results for input(s): CHOL, HDL, LDLCALC, TRIG, CHOLHDL, LDLDIRECT in the last 72 hours. Thyroid Function Tests: Recent Labs    04/12/20 0027  TSH 2.246   Anemia Panel: Recent Labs    04/11/20 1841  VITAMINB12 702   Sepsis Labs: Recent Labs  Lab 04/11/20 1841 04/11/20 2111  LATICACIDVEN 2.2* 2.6*    Recent Results (from the past 240 hour(s))  Resp Panel by RT-PCR (  Flu A&B, Covid) Nasopharyngeal Swab     Status: None   Collection Time: 04/11/20 11:40 AM   Specimen: Nasopharyngeal Swab; Nasopharyngeal(NP) swabs in vial transport medium  Result Value Ref Range Status   SARS Coronavirus 2 by RT PCR NEGATIVE NEGATIVE Final    Comment: (NOTE) SARS-CoV-2 target nucleic acids are NOT DETECTED.  The SARS-CoV-2 RNA is generally detectable in upper respiratory specimens during the acute phase of infection. The lowest concentration of SARS-CoV-2 viral copies this assay can detect is 138 copies/mL. A negative result does not preclude SARS-Cov-2 infection and should not be used as the sole basis for treatment or other patient management decisions. A negative result may occur with  improper specimen collection/handling, submission of specimen other than nasopharyngeal swab, presence of viral mutation(s) within the areas targeted by this assay, and inadequate number of viral copies(<138 copies/mL). A negative result must be combined with clinical observations, patient history, and epidemiological information. The expected result is Negative.  Fact Sheet for Patients:  EntrepreneurPulse.com.au  Fact Sheet for Healthcare Providers:  IncredibleEmployment.be  This test is no t yet approved or cleared by the [place] FDA and  has been authorized for detection and/or diagnosis of SARS-CoV-2 by FDA under an Emergency Use Authorization (EUA). This EUA will remain  in effect (meaning this test can be used) for the duration  of the COVID-19 declaration under Section 564(b)(1) of the Act, 21 U.S.C.section 360bbb-3(b)(1), unless the authorization is terminated  or revoked sooner.       Influenza A by PCR NEGATIVE NEGATIVE Final   Influenza B by PCR NEGATIVE NEGATIVE Final    Comment: (NOTE) The Xpert Xpress SARS-CoV-2/FLU/RSV plus assay is intended as an aid in the diagnosis of influenza from Nasopharyngeal swab specimens and should not be used as a sole basis for treatment. Nasal washings and aspirates are unacceptable for Xpert Xpress SARS-CoV-2/FLU/RSV testing.  Fact Sheet for Patients: EntrepreneurPulse.com.au  Fact Sheet for Healthcare Providers: IncredibleEmployment.be  This test is not yet approved or cleared by the [place] FDA and has been authorized for detection and/or diagnosis of SARS-CoV-2 by FDA under an Emergency Use Authorization (EUA). This EUA will remain in effect (meaning this test can be used) for the duration of the COVID-19 declaration under Section 564(b)(1) of the Act, 21 U.S.C. section 360bbb-3(b)(1), unless the authorization is terminated or revoked.  Performed at [facility], [address], [place], [place] [address]      Radiology Studies: US Abdomen Limited RUQ (LIVER/GB)  Result Date: 04/11/2020 CLINICAL DATA:  Pain with nausea and hemoptysis EXAM: ULTRASOUND ABDOMEN LIMITED RIGHT UPPER QUADRANT COMPARISON:  CT abdomen and pelvis April 08, 2020 FINDINGS: Gallbladder: There is sludge in the gallbladder. No gallstones are evident. Gallbladder wall is thickened. There is no appreciable pericholecystic fluid. No sonographic Murphy sign noted by sonographer. Common bile duct: Diameter: 4 mm. No intrahepatic or extrahepatic biliary duct dilatation evident. Liver: No focal lesion identified. Within normal limits in parenchymal echogenicity. Portal vein is patent on color Doppler imaging with normal direction of blood flow  towards the liver. Other: There is a cyst in the upper pole the right kidney measuring 1.9 x 1.7 x 1.5 cm. IMPRESSION: 1. No gallstones evident. There is sludge in the gallbladder with gallbladder wall thickening. Question a degree of acalculus cholecystitis. This finding may warrant nuclear medicine hepatobiliary imaging study to assess for cystic duct patency. 2. Cyst arising from upper pole right kidney measuring 1.9 x 1.7 x  1.5 cm. 3.  Study otherwise unremarkable. Electronically Signed   By: Lowella Grip Baxter M.D.   On: 04/11/2020 14:13     LOS: 0 days   Antonieta Pert, MD [facility]  04/12/2020, 10:52 AM

## 2020-04-12 NOTE — Progress Notes (Signed)
Patient received from [facility] at 2010 via stretcher AAO x4. Assisted to bed, with IV heparin infusing via L arm. ,Denies SOB nor pain. Placed on cardiac monitor, vital signs checked,  Patient wife at bedside.

## 2020-04-12 NOTE — Progress Notes (Signed)
Patient did not end up getting PICC line so co ox is not a central venous sample.  Will call PCCM to place central line and repeat co ox.

## 2020-04-12 NOTE — Progress Notes (Signed)
Pt transferred to [facility] via stretcher and care link.  Attempted to call report at [place] was told RN in report and gave number to call RN back for report. RN did not call. Pt arrived to [place] and transferred over to [facility] care.

## 2020-04-13 ENCOUNTER — Other Ambulatory Visit: Payer: Self-pay

## 2020-04-13 ENCOUNTER — Telehealth: Payer: Self-pay

## 2020-04-13 DIAGNOSIS — I5021 Acute systolic (congestive) heart failure: Secondary | ICD-10-CM | POA: Diagnosis not present

## 2020-04-13 DIAGNOSIS — R112 Nausea with vomiting, unspecified: Secondary | ICD-10-CM | POA: Diagnosis not present

## 2020-04-13 DIAGNOSIS — I5082 Biventricular heart failure: Secondary | ICD-10-CM | POA: Diagnosis not present

## 2020-04-13 DIAGNOSIS — R000 Tachycardia, unspecified: Secondary | ICD-10-CM | POA: Diagnosis not present

## 2020-04-13 LAB — URINALYSIS, ROUTINE W REFLEX MICROSCOPIC
Glucose, UA: NEGATIVE mg/dL
Ketones, ur: NEGATIVE mg/dL
Nitrite: NEGATIVE
Protein, ur: NEGATIVE mg/dL
Specific Gravity, Urine: 1.025 (ref 1.005–1.030)
pH: 5.5 (ref 5.0–8.0)

## 2020-04-13 LAB — COMPREHENSIVE METABOLIC PANEL
ALT: 169 U/L — ABNORMAL HIGH (ref 0–44)
AST: 104 U/L — ABNORMAL HIGH (ref 15–41)
Albumin: 2.5 g/dL — ABNORMAL LOW (ref 3.5–5.0)
Alkaline Phosphatase: 59 U/L (ref 38–126)
Anion gap: 8 (ref 5–15)
BUN: 20 mg/dL (ref 6–20)
CO2: 26 mmol/L (ref 22–32)
Calcium: 8.6 mg/dL — ABNORMAL LOW (ref 8.9–10.3)
Chloride: 102 mmol/L (ref 98–111)
Creatinine, Ser: 1.26 mg/dL — ABNORMAL HIGH (ref 0.61–1.24)
GFR, Estimated: >60 mL/min (ref >60)
Glucose, Bld: 126 mg/dL — ABNORMAL HIGH (ref 70–99)
Potassium: 4.9 mmol/L (ref 3.5–5.1)
Sodium: 136 mmol/L (ref 135–145)
Total Bilirubin: 4.5 mg/dL — ABNORMAL HIGH (ref 0.3–1.2)
Total Protein: 6.1 g/dL — ABNORMAL LOW (ref 6.5–8.1)

## 2020-04-13 LAB — URINALYSIS, MICROSCOPIC (REFLEX)

## 2020-04-13 LAB — COOXEMETRY PANEL
Carboxyhemoglobin: 1.3 % (ref 0.5–1.5)
Carboxyhemoglobin: 1 % (ref 0.5–1.5)
Methemoglobin: 0.9 % (ref 0.0–1.5)
Methemoglobin: 0.7 % (ref 0.0–1.5)
O2 Saturation: 69.1 %
O2 Saturation: 48.4 %
Total hemoglobin: 15.7 g/dL (ref 12.0–16.0)
Total hemoglobin: 15.2 g/dL (ref 12.0–16.0)

## 2020-04-13 LAB — LIPASE, BLOOD: Lipase: 201 U/L — ABNORMAL HIGH (ref 11–51)

## 2020-04-13 LAB — CBC
HCT: 48.7 % (ref 39.0–52.0)
Hemoglobin: 15.5 g/dL (ref 13.0–17.0)
MCH: 25.3 pg — ABNORMAL LOW (ref 26.0–34.0)
MCHC: 31.8 g/dL (ref 30.0–36.0)
MCV: 79.4 fL — ABNORMAL LOW (ref 80.0–100.0)
Platelets: 108 10*3/uL — ABNORMAL LOW (ref 150–400)
RBC: 6.13 MIL/uL — ABNORMAL HIGH (ref 4.22–5.81)
RDW: 17.8 % — ABNORMAL HIGH (ref 11.5–15.5)
WBC: 7.3 10*3/uL (ref 4.0–10.5)
nRBC: 0 % (ref 0.0–0.2)

## 2020-04-13 LAB — HEPARIN LEVEL (UNFRACTIONATED)
Heparin Unfractionated: 0.24 IU/mL — ABNORMAL LOW (ref 0.30–0.70)
Heparin Unfractionated: 0.37 IU/mL (ref 0.30–0.70)

## 2020-04-13 MED ORDER — FUROSEMIDE 10 MG/ML IJ SOLN
20.0000 mg/h | INTRAVENOUS | Status: DC
  Administered 2020-04-13 – 2020-04-15 (×6): 20 mg/h via INTRAVENOUS
  Filled 2020-04-13 (×10): qty 20

## 2020-04-13 MED ORDER — SODIUM CHLORIDE 0.9% FLUSH
10.0000 mL | INTRAVENOUS | Status: DC | PRN
  Administered 2020-04-23: 10 mL
  Administered 2020-04-23: 20 mL

## 2020-04-13 MED ORDER — DIGOXIN 125 MCG PO TABS
0.1250 mg | ORAL_TABLET | Freq: Every day | ORAL | Status: DC
  Administered 2020-04-13 – 2020-04-24 (×11): 0.125 mg via ORAL
  Filled 2020-04-13 (×11): qty 1

## 2020-04-13 MED ORDER — SODIUM CHLORIDE 0.9% FLUSH
10.0000 mL | Freq: Two times a day (BID) | INTRAVENOUS | Status: DC
  Administered 2020-04-13: 40 mL
  Administered 2020-04-13 – 2020-04-23 (×19): 10 mL

## 2020-04-13 MED ORDER — CHLORHEXIDINE GLUCONATE CLOTH 2 % EX PADS
6.0000 | MEDICATED_PAD | Freq: Every day | CUTANEOUS | Status: DC
  Administered 2020-04-13 – 2020-04-24 (×9): 6 via TOPICAL

## 2020-04-13 MED ORDER — MILRINONE LACTATE IN DEXTROSE 20-5 MG/100ML-% IV SOLN
0.1250 ug/kg/min | INTRAVENOUS | Status: DC
  Administered 2020-04-13 – 2020-04-18 (×7): 0.125 ug/kg/min via INTRAVENOUS
  Filled 2020-04-13 (×8): qty 100

## 2020-04-13 NOTE — Progress Notes (Signed)
[place] for Heparin Indication: DVT and apical thrombus  Allergies  Allergen Reactions  . Pseudoephedrine     REACTION: jittery    Patient Measurements: Height: 6\' 1"  (185.4 cm) Weight: 135.2 kg (298 lb) IBW/kg (Calculated) : 79.9 Heparin Dosing Weight: 110 kg  Vital Signs: Temp: 98 F (36.7 C) (03/15 0749) Temp Source: Oral (03/15 0749) BP: 133/98 (03/15 0749) Pulse Rate: 113 (03/15 0749)  Labs: Recent Labs    04/11/20 1140 04/11/20 1841 04/11/20 2111 04/12/20 0027 04/12/20 0752 04/12/20 1552 04/12/20 2046 04/13/20 0041 04/13/20 1040 04/13/20 1250  HGB 16.2 16.2  --  16.0 15.8  --  16.3  --  15.5  --   HCT 50.2 51.2  --  51.4 50.4  --  50.8  --  48.7  --   PLT 146*  --   --   --   --   --   --   --  108*  --   APTT  --   --   --   --   --  35  --   --   --   --   LABPROT  --   --   --   --   --  21.2*  --   --   --   --   INR  --   --   --   --   --  1.9*  --   --   --   --   HEPARINUNFRC  --   --   --   --   --   --   --  0.24*  --  0.37  CREATININE 1.12  --   --  0.91  --   --   --  1.26*  --   --   TROPONINIHS 42* 48* 38*  --   --   --   --   --   --   --     Estimated Creatinine Clearance: 100.1 mL/min (A) (by C-G formula based on SCr of 1.26 mg/dL (H)).  Assessment: 50 y.o. male with RLE DVT and apical thrombus on IV heparin.  Heparin level this morning within goal range.  No overt bleeding noted other than a little hemoptysis.  CBC stable.  Goal of Therapy:  Heparin level 0.3-0.7 units/ml Monitor platelets by anticoagulation protocol: Yes   Plan:  Continue IV heparin at 2300 units/hr. Daily heparin level and CBC. F/u plan for oral anticoagulation eventually.  [place] Crane, Roylene Reason, BCCP Clinical Pharmacist  04/13/2020 2:15 PM   [facility] pharmacy phone numbers are listed on [email or web address]

## 2020-04-13 NOTE — Consult Note (Signed)
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 Radford Pax.  He is a 50 yo male with PMH of GERD, poorly controlled HTN, obesity   Never saw a cardiologist prior to this admission.  He was followed by his PCP for HTN, I can find only one visit in 2015 and 2 visits in February 2022 where this was well controlled.  He was sent for a sleep study with high suspicion for OSA but never went.  HR has been around 100 or more since 2013.    Says before December 2021 he was doing fine, no issues.  After that he reports developing fatigue, shortness of breath, dry cough, abdominal pain and nausea. Denies any chest pain episodes but according to wife would have occasional diaphoretic episodes.  He also states that he has been having palpitations starting in December as well. Son diagnosed with Covid in January but he tested negative and said symptoms developed beforehand.  Also had GI symptoms which he thought were related.    Was seen initially in urgent care on 02/16/20 with persistent dry cough, nausea, abd pain thought to be due to GERD, HR 110 and BP 157/104 at the time.  Started on PPI.    Then presented to the ED on 02/26/20 with epigastric pain, nausea, dyspnea and was also tachycardic.  A CT angio of the chest was performed which was negative for PE and he had a CT abd/pelv which was suspicous for pancreatitis and he was admitted for acute pancreatitis.  Received aggressive IVF hydration and antiemetics then discharged.  He was also noted to have moderate Hgb which may have represented rhabdo as there were no rbc's on microscopy.    Presented to the ED on 3/11 with transient neurological changes after EGD.  Workup was negative for intracranial process.  He was discharged home.  On 3/13 he called  his GI doctor to report he was spitting up blood, having persistent nausea and vomiting, they recommended he go tot he ER and he presented two days later with this, acute cholecystitis, elevated troponin around 80 x2, elevated BNP.  He had an ECHO which demonstrated a large apical LV thrombus and severely decreased EF biventricular failure.  It was reported that his bp was very low on admission but I can't find any objective evidence of this.  Also found to have acute right lower extremity and DVT.    Today he is still feeling fatigued, gets dyspneic with minimal exertion.  Denies chest pain, still having abdominal pain and sputum mixed with blood in his cup.    Review of Systems: [y] = yes, [ ]  = no   General: Weight gain [ ] ; Weight loss [ y]; Anorexia [ ] ; Fatigue [ y]; Fever [ ] ; Chills [ ] ; Weakness Blue.Reese ]  Cardiac: Chest pain/pressure [ ] ; Resting SOB [ ] ; Exertional SOB [y]; Orthopnea [ ] ; Pedal Edema [ ] ; Palpitations Blue.Reese ]; Syncope [ ] ; Presyncope [ ] ; Paroxysmal nocturnal dyspnea[ ]   Pulmonary: Cough Blue.Reese ]; Wheezing[ ] ; Hemoptysis[x ]; Sputum Blue.Reese ]; Snoring [ ]   GI: Vomiting[y ]; Dysphagia[ ] ; Melena[ ] ; Hematochezia [ ] ; Heartburn[ ] ; Abdominal pain Blue.Reese ]; Constipation [ ] ; Diarrhea [ ] ; BRBPR [ ]   GU: Hematuria[ ] ; Dysuria [ ] ; Nocturia[ ]   Vascular: Pain in legs with walking [ ] ; Pain in feet with lying flat [ ] ; Non-healing sores [ ] ; Stroke [ ] ; TIA [ ] ; Slurred speech [ ] ;  Neuro: Headaches[ ] ; Vertigo[ ] ; Seizures[ ] ; Paresthesias[ ] ;Blurred vision [ ] ; Diplopia [ ] ; Vision changes [ ]   Ortho/Skin: Arthritis Blue.Reese ]; Joint pain [ ] ; Muscle pain [ ] ; Joint swelling [ ] ; Back Pain [ ] ; Rash [ ]   Psych: Depression[ ] ; Anxiety[ ]   Heme: Bleeding problems [ ] ; Clotting disorders [ ] ; Anemia [ ]   Endocrine: Diabetes [ ] ; Thyroid dysfunction[ ]   Home Medications Prior to Admission medications   Medication Sig Start Date End Date Taking? Authorizing Provider  allopurinol (ZYLOPRIM) 300 MG tablet  Take 1 tablet (300 mg total) by mouth daily. 12/22/19  Yes Paz, Alda Berthold, MD  buPROPion (WELLBUTRIN) 100 MG tablet Take 1 tablet a day x 1 week, then 1 tab BID Patient taking differently: Take 100 mg by mouth 2 (two) times daily. 03/02/20  Yes Paz, Alda Berthold, MD  dicyclomine (BENTYL) 20 MG tablet Take 1 tablet (20 mg total) by mouth every 6 (six) hours as needed for spasms. Patient taking differently: Take 20 mg by mouth daily. 04/22/19  Yes Gatha Mayer, MD  losartan (COZAAR) 50 MG tablet Take 2 tablets (100 mg total) by mouth daily. 04/08/20  Yes Paz, Alda Berthold, MD  ondansetron (ZOFRAN ODT) 4 MG disintegrating tablet Take 1 tablet (4 mg total) by mouth every 8 (eight) hours as needed for nausea or vomiting. 04/09/20  Yes Carmin Muskrat, MD  sucralfate (CARAFATE) 1 g tablet Take 1 tablet (1 g total) by mouth 4 (four) times daily -  with meals and at bedtime. Patient taking differently: Take 1 g by mouth daily. 04/09/20  Yes Carmin Muskrat, MD  pantoprazole (PROTONIX) 40 MG tablet Take 1 tablet (40 mg total) by mouth daily. Patient not taking: No sig reported 03/22/20   Shelda Pal, DO    Past Medical History: Past Medical History:  Diagnosis Date  . Allergy   . Anxiety   . Arthritis   . Fundic gland polyposis of stomach   . GERD with esophagitis   . Gout   . Hx of adenomatous colonic polyps   . Hypertension     Past Surgical History: Past Surgical History:  Procedure Laterality Date  . COLONOSCOPY  05/2019  . ESOPHAGOGASTRODUODENOSCOPY  05/2019  . HAND SURGERY Right    2006 for a FX    Family History: Family History  Problem Relation Age of Onset  . Cancer Mother        CERVICAL  . Hypertension Father   . Heart failure Father        no MI, d/t etoh  . Diabetes Sister   . Diabetes Sister   . Diabetes Paternal Aunt   . Diabetes Cousin   . Colon cancer Neg Hx   . Prostate cancer Neg Hx   . Stroke Neg Hx   . Esophageal cancer Neg Hx   . Rectal cancer Neg Hx   .  Stomach cancer Neg Hx     Social History: Social History   Socioeconomic History  . Marital status: Married    Spouse name: Not on file  . Number of children: 2  . Years of education: Not on file  . Highest education level: Not on file  Occupational History  . Occupation: works for [employer]  . Smoking status: Never  Smoker  . Smokeless tobacco: Never Used  Vaping Use  . Vaping Use: Never used  Substance and Sexual Activity  . Alcohol use: Yes    Comment: very rare   . Drug use: No  . Sexual activity: Not on file  Other Topics Concern  . Not on file  Social History Narrative   Household: pt, wife daughter (2013),  boy  (2006),    Social Determinants of Radio broadcast assistant Strain: Not on file  Food Insecurity: Not on file  Transportation Needs: Not on file  Physical Activity: Not on file  Stress: Not on file  Social Connections: Not on file    Allergies:  Allergies  Allergen Reactions  . Pseudoephedrine     REACTION: jittery    Objective:    Vital Signs:   Temp:  [97.8 F (36.6 C)-98.1 F (36.7 C)] 98 F (36.7 C) (03/15 0749) Pulse Rate:  [62-119] 113 (03/15 0749) Resp:  [18-20] 20 (03/15 0749) BP: (126-136)/(96-106) 133/98 (03/15 0749) SpO2:  [94 %-97 %] 96 % (03/15 0749) Last BM Date: 04/12/20  Weight change: Filed Weights   04/11/20 1123  Weight: 135.2 kg    Intake/Output:   Intake/Output Summary (Last 24 hours) at 04/13/2020 1001 Last data filed at 04/13/2020 0313 Gross per 24 hour  Intake -  Output 475 ml  Net -475 ml      Physical Exam    General:  Obese mail in no distress. No resp difficulty HEENT: normal Neck: supple. JVP difficult to assess . Carotids 2+ bilat; no bruits. No lymphadenopathy or thyromegaly appreciated. Cor: PMI nondisplaced. Tachycardic but regular rhythm. No rubs, gallops or murmurs. Lungs: clear Abdomen: soft, mildly tender to RUQ but no guarding.  Extremities: + for clubbing of fingers, no  rash, R LE edema 2+, LLE edema trace Neuro: alert & orientedx3  Telemetry   Sinus tach ~110  EKG    3/14: sinus tach rate 120, Left atrial enlargement  Labs   Basic Metabolic Panel: Recent Labs  Lab 04/09/20 1301 04/11/20 1140 04/12/20 0027 04/13/20 0041  NA 138 135 136 136  K 4.7 4.5 4.6 4.9  CL 105 102 105 102  CO2 23 21* 20* 26  GLUCOSE 119* 125* 120* 126*  BUN 18 24* 23* 20  CREATININE 1.14 1.12 0.91 1.26*  CALCIUM 8.3* 8.6* 8.5* 8.6*  MG 2.0 2.0  --   --     Liver Function Tests: Recent Labs  Lab 04/09/20 1301 04/11/20 1140 04/12/20 0027 04/13/20 0041  AST 40 120* 144* 104*  ALT 69* 134* 172* 169*  ALKPHOS 61 64 59 59  BILITOT 3.3* 3.9* 4.0* 4.5*  PROT 6.3* 6.8 6.6 6.1*  ALBUMIN 2.7* 2.9* 2.9* 2.5*   Recent Labs  Lab 04/11/20 1140 04/11/20 1841 04/12/20 0027 04/13/20 0041  LIPASE 107* 135* 155* 201*  AMYLASE  --   --  159*  --    No results for input(s): AMMONIA in the last 168 hours.  CBC: Recent Labs  Lab 04/09/20 1301 04/11/20 1140 04/11/20 1841 04/12/20 0027 04/12/20 0752 04/12/20 2046  WBC 6.9 6.9  --   --   --   --   NEUTROABS 5.6 4.9  --   --   --   --   HGB 16.0 16.2 16.2 16.0 15.8 16.3  HCT 50.8 50.2 51.2 51.4 50.4 50.8  MCV 80.5 79.4*  --   --   --   --   PLT 175 146*  --   --   --   --  Cardiac Enzymes: No results for input(s): CKTOTAL, CKMB, CKMBINDEX, TROPONINI in the last 168 hours.  BNP: BNP (last 3 results) Recent Labs    04/12/20 1216  BNP 1,153.9*    ProBNP (last 3 results) No results for input(s): PROBNP in the last 8760 hours.   CBG: No results for input(s): GLUCAP in the last 168 hours.  Coagulation Studies: Recent Labs    04/12/20 1552  LABPROT 21.2*  INR 1.9*     Imaging   CT HEAD WO CONTRAST  Result Date: 04/12/2020 CLINICAL DATA:  Altered mental status. EXAM: CT HEAD WITHOUT CONTRAST TECHNIQUE: Contiguous axial images were obtained from the base of the skull through the vertex  without intravenous contrast. COMPARISON:  MR head, dated April 09, 2020 FINDINGS: Brain: No evidence of acute infarction, hemorrhage, hydrocephalus, extra-axial collection or mass lesion/mass effect. Vascular: No hyperdense vessel or unexpected calcification. Skull: Normal. Negative for fracture or focal lesion. Sinuses/Orbits: No acute finding. Other: None. IMPRESSION: No acute intracranial pathology. Electronically Signed   By: Virgina Norfolk M.D.   On: 04/12/2020 17:23   NM Hepatobiliary Liver Func  Result Date: 04/12/2020 CLINICAL DATA:  Evaluate for cholecystitis. Nausea and vomiting. Right upper quadrant pain. EXAM: NUCLEAR MEDICINE HEPATOBILIARY IMAGING TECHNIQUE: Sequential images of the abdomen were obtained out to 60 minutes following intravenous administration of radiopharmaceutical. RADIOPHARMACEUTICALS:  7.5 mCi Tc-64m  Choletec IV COMPARISON:  Abdominal sonogram 04/11/2020 FINDINGS: Prompt uptake and biliary excretion of activity by the liver is seen. Biliary activity passes into small bowel, consistent with patent common bile duct. After 60 minutes no gallbladder activity was visualized. The patient subsequently received 3 mg of morphine, IV. Imaging was performed for an additional 30 minutes without evidence for gallbladder activity. IMPRESSION: 1. Nonvisualization of the gallbladder which is compatible with acute cholecystitis. Electronically Signed   By: Kerby Moors M.D.   On: 04/12/2020 13:00   DG Chest Port 1 View  Result Date: 04/12/2020 CLINICAL DATA:  Evaluate for congestive heart failure. EXAM: PORTABLE CHEST 1 VIEW COMPARISON:  April 01, 2020 FINDINGS: The cardiac silhouette is markedly enlarged and unchanged in size. Both lungs are clear. The visualized skeletal structures are unremarkable. IMPRESSION: 1. Cardiomegaly without acute or active cardiopulmonary disease. Electronically Signed   By: Virgina Norfolk M.D.   On: 04/12/2020 15:33   ECHOCARDIOGRAM COMPLETE  Result  Date: 04/12/2020    ECHOCARDIOGRAM REPORT   Patient Name:   Kevin Baxter Date of Exam: 04/12/2020 Medical Rec #:  [id number]          Height:       73.0 in Accession #:    [id number]         Weight:       298.0 lb Date of Birth:  Aug 10, 1970         BSA:          2.549 m Patient Age:    16 years           BP:           122/99 mmHg Patient Gender: M                  HR:           115 bpm. Exam Location:  Inpatient Procedure: 2D Echo, Cardiac Doppler, Color Doppler and Intracardiac            Opacification Agent  STAT ECHO Reported to: Dr Mertie Moores on 04/12/2020 2:13:00 PM. Indications:    Elevated Troponin  History:        Patient has no prior history of Echocardiogram examinations.                 Risk Factors:Hypertension and GERD.  Sonographer:    Jonelle Sidle Dance Referring Phys: [id number] [place] IMPRESSIONS  1. There is a large apical thromus measuring 3.6 x 2.3 cm .     Marland Kitchen Left ventricular ejection fraction, by estimation, is <20%. The left ventricle has severely decreased function. The left ventricle demonstrates regional wall motion abnormalities (see scoring diagram/findings for description). The left ventricular internal cavity size was moderately to severely dilated. Left ventricular diastolic function could not be evaluated. There is severe akinesis of the left ventricular, mid-apical apical segment, anteroseptal wall, inferoseptal wall and lateral wall.  2. Right ventricular systolic function was not well visualized. The right ventricular size is not well visualized.  3. The mitral valve is grossly normal. No evidence of mitral valve regurgitation.  4. The aortic valve is normal in structure. Aortic valve regurgitation is not visualized. No aortic stenosis is present.  5. Aortic dilatation noted. There is borderline dilatation of the ascending aorta, measuring 39 mm. FINDINGS  Left Ventricle: There is a large apical thromus measuring 3.6 x 2.3 cm. Left ventricular ejection  fraction, by estimation, is <20%. The left ventricle has severely decreased function. The left ventricle demonstrates regional wall motion abnormalities. Severe akinesis of the left ventricular, mid-apical apical segment, anteroseptal wall, inferoseptal wall and lateral wall. Definity contrast agent was given IV to delineate the left ventricular endocardial borders. The left ventricular internal cavity size was moderately to severely dilated. There is no left ventricular hypertrophy. Left ventricular diastolic function could not be evaluated. Right Ventricle: The right ventricular size is not well visualized. Right vetricular wall thickness was not well visualized. Right ventricular systolic function was not well visualized. Left Atrium: Left atrial size was not well visualized. Right Atrium: Right atrial size was not well visualized. Pericardium: There is no evidence of pericardial effusion. Mitral Valve: The mitral valve is grossly normal. No evidence of mitral valve regurgitation. Tricuspid Valve: The tricuspid valve is normal in structure. Tricuspid valve regurgitation is mild. Aortic Valve: The aortic valve is normal in structure. Aortic valve regurgitation is not visualized. No aortic stenosis is present. Pulmonic Valve: The pulmonic valve was normal in structure. Pulmonic valve regurgitation is not visualized. Aorta: Aortic dilatation noted. There is borderline dilatation of the ascending aorta, measuring 39 mm. IAS/Shunts: The atrial septum is grossly normal.  LEFT VENTRICLE PLAX 2D LVIDd:         6.23 cm  Diastology LVIDs:         5.59 cm  LV e' medial:    5.84 cm/s LV PW:         1.38 cm  LV E/e' medial:  14.0 LV IVS:        0.94 cm  LV e' lateral:   6.48 cm/s LVOT diam:     2.30 cm  LV E/e' lateral: 12.6 LV SV:         23 LV SV Index:   9 LVOT Area:     4.15 cm  RIGHT VENTRICLE             IVC RV Basal diam:  3.49 cm     IVC diam: 2.50 cm RV Mid diam:    2.29 cm  RV S prime:     10.30 cm/s TAPSE (M-mode):  1.8 cm LEFT ATRIUM         Index LA diam:    4.60 cm 1.80 cm/m  AORTIC VALVE LVOT Vmax:   46.50 cm/s LVOT Vmean:  30.100 cm/s LVOT VTI:    0.055 m  AORTA Ao Root diam: 3.40 cm Ao Asc diam:  3.90 cm MITRAL VALVE MV Area (PHT): 4.70 cm    SHUNTS MV Decel Time: 162 msec    Systemic VTI:  0.05 m MV E velocity: 81.70 cm/s  Systemic Diam: 2.30 cm MV A velocity: 30.60 cm/s MV E/A ratio:  2.67 Mertie Moores MD Electronically signed by Mertie Moores MD Signature Date/Time: 04/12/2020/2:24:59 PM    Final    VAS [place] LOWER EXTREMITY VENOUS (DVT)  Result Date: 04/12/2020  Lower Venous DVT Study Indications: Swelling.  Risk Factors: None identified. Limitations: Body habitus and poor ultrasound/tissue interface. Comparison Study: No prior studies. Performing Technologist: Oliver Hum RVT  Examination Guidelines: A complete evaluation includes B-mode imaging, spectral Doppler, color Doppler, and power Doppler as needed of all accessible portions of each vessel. Bilateral testing is considered an integral part of a complete examination. Limited examinations for reoccurring indications may be performed as noted. The reflux portion of the exam is performed with the patient in reverse Trendelenburg.  +---------+---------------+---------+-----------+----------+-------------------+ RIGHT    CompressibilityPhasicitySpontaneityPropertiesThrombus Aging      +---------+---------------+---------+-----------+----------+-------------------+ CFV      Full           No       No                                       +---------+---------------+---------+-----------+----------+-------------------+ SFJ      Full                                                             +---------+---------------+---------+-----------+----------+-------------------+ FV Prox  Full                                                             +---------+---------------+---------+-----------+----------+-------------------+ FV Mid    None           No       No                   Acute               +---------+---------------+---------+-----------+----------+-------------------+ FV DistalNone           No       No                   Acute               +---------+---------------+---------+-----------+----------+-------------------+ PFV      Full                                                             +---------+---------------+---------+-----------+----------+-------------------+  POP      Full           Yes      Yes                                      +---------+---------------+---------+-----------+----------+-------------------+ PTV      Full                                                             +---------+---------------+---------+-----------+----------+-------------------+ PERO                                                  Not well visualized +---------+---------------+---------+-----------+----------+-------------------+   +---------+---------------+---------+-----------+----------+--------------+ LEFT     CompressibilityPhasicitySpontaneityPropertiesThrombus Aging +---------+---------------+---------+-----------+----------+--------------+ CFV      Full           Yes      Yes                                 +---------+---------------+---------+-----------+----------+--------------+ SFJ      Full                                                        +---------+---------------+---------+-----------+----------+--------------+ FV Prox  Full                                                        +---------+---------------+---------+-----------+----------+--------------+ FV Mid   Full                                                        +---------+---------------+---------+-----------+----------+--------------+ FV DistalFull                                                         +---------+---------------+---------+-----------+----------+--------------+ PFV      Full                                                        +---------+---------------+---------+-----------+----------+--------------+ POP      Full           Yes      Yes                                 +---------+---------------+---------+-----------+----------+--------------+  PTV      Full                                                        +---------+---------------+---------+-----------+----------+--------------+ PERO     Full                                                        +---------+---------------+---------+-----------+----------+--------------+     Summary: RIGHT: - Findings consistent with acute deep vein thrombosis involving the right femoral vein. - No cystic structure found in the popliteal fossa.  LEFT: - There is no evidence of deep vein thrombosis in the lower extremity. However, portions of this examination were limited- see technologist comments above.  - No cystic structure found in the popliteal fossa.  *See table(s) above for measurements and observations.    Preliminary    [place] EKG SITE RITE  Result Date: 04/12/2020 If Site Rite image not attached, placement could not be confirmed due to current cardiac rhythm.     Medications:     Current Medications: . allopurinol  300 mg Oral Daily  . buPROPion  100 mg Oral BID  . Chlorhexidine Gluconate Cloth  6 each Topical Daily  . metoCLOPramide (REGLAN) injection  5 mg Intravenous Q6H  . morphine      . pantoprazole (PROTONIX) IV  40 mg Intravenous Q12H  . sodium chloride flush  10-40 mL Intracatheter Q12H  . sucralfate  1 g Oral TID WC & HS  . thiamine injection  100 mg Intravenous Daily     Infusions: . heparin 2,300 Units/hr (04/13/20 0431)  . piperacillin-tazobactam (ZOSYN)  IV 3.375 g (04/13/20 0313)  . promethazine (PHENERGAN) injection          Assessment/Plan   Systolic Biventricular  CHF: -ECHO with large LV thromus, EF <20%, RV dysfunction, dilated LV, no real valve disease, + for RWMA, diastolic function not evaluated -do not suspect this is acute likely began months ago -NYHA class Baxter symptoms -coox 60% but with hgb of 16 calculated cardiac index 1.5 -does have uncontrolled HTN and obesity with likely OSA but systolic dysfunction  out of proportion to degree of HTN -tested many times for covid and never positive, Flu neg, HIV negative not sure if a RVP would be useful this far out but will order -TSH normal -less likely amyloid or infiltrative disease with dilation and minimal hypertrophy, MM panel pending -no calcifications of coronaries on imaging, risk factors include obesity, HTN, early erectile dysfunction, RWMA on echo -Needs L/RHC later this week -Need to rule out sarcoid with clubbing, dilated cardiomyopathy and GI findings -needs outpatient sleep study likely has OSA  -Cardiac MRI tomorrow -with palpitation history may benefit from cardiac monitor going forward -cvp 17, start on milrinone and lasix gtt -follow coox, renal function  LV thrombus: -2.3 x 3.6 cm apical thrombus -currently on heparin, keep for now transition to oral anticoagulant later  Tachycardia: -seems to be long standing since 2013 around 100-105 but worse during this admission  Acute Cholecystitis: -conservative management with abx for now, surgery following  Acute RLE DVT: -on heparin -hypercoagulability workup pending, likely done while on heparin  which will affect some results  ?hemoptysis:  -he describes blood and sputum from coughing more so than related to his vomiting, this also correlates with the findings on recent EGD -low risk for TB  -may also have PE, he is already on anticoagulation determine timing for workup.    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 I personally reviewed laboratory data, imaging studies and relevant notes. I independently examined the patient and formulated the important aspects of the plan. I have edited the note to reflect any of my changes or salient points. I have personally discussed the plan with the patient and/or family.  50 y/o male with several month h/o SOB, cough, abdomina symptoms. Found t have EF 10% with large LV apical thrombus and severe RV dysfunction. Remains SOB and orthopneic. HR 120s. No CP  General:  Lying in bed. Apprhensive HEENT: normal Neck: supple. JVP to ear (18) Carotids 2+ bilat; no bruits. No lymphadenopathy or thryomegaly appreciated. Cor: PMI nondisplaced. Regular tachy +s3  Lungs: clear Abdomen: soft, minimally tender, nondistended. No hepatosplenomegaly. No bruits or masses. Good bowel sounds. Extremities: no cyanosis, clubbing, rash, 2+ edema cool  Neuro: alert & orientedx3, cranial nerves grossly intact. moves all 4 extremities w/o difficulty. Affect pleasant  He has low output HF with marked volume overload and large LV clot. Suspect all of his symptoms are related to HF and doubt he has cholecystitis. Will start milrinone and lasix gtt. Continue heparin. Possible R/L cath tomorrow or later if unable to lie flat. He is at very high risk for decompensation and will move to ICU if he begins to deteriorate.    CRITICAL CARE Performed by: Glori Bickers  Total critical care time: 45 minutes  Critical care time was exclusive of separately billable procedures and treating other patients.  Critical care was necessary to treat or prevent imminent or life-threatening deterioration.  Critical care was time spent personally by  me (independent of midlevel providers or residents) on the following activities: development of treatment plan with patient and/or surrogate as well as nursing, discussions with consultants, evaluation of patient's response to treatment, examination of patient, obtaining history from patient or surrogate, ordering and performing treatments and interventions, ordering and review of laboratory studies, ordering and review of radiographic studies, pulse oximetry and re-evaluation of patient's condition.  Glori Bickers, MD  11:06 PM

## 2020-04-13 NOTE — Progress Notes (Signed)
IV RN at  patient BS,consent for PICC line obtained at 10 pm , stated  will  come back for insertion. Patient stated feeling nervous ,  Claimed  he had alprazolam before . Also c/o upper back pain . Dr Marlyce Huge  Notified. EKG ordered , pain med to be given after EKG result. Vital signs monitored.

## 2020-04-13 NOTE — Progress Notes (Signed)
.       Subjective: CC: Transferred from [facility] yesterday for large LV apical thrombus, severe LV dysfunction with EF < 20% of unclear etiology. He notes improved abdominal pain from a 8/10 to a 6/10 in the RUQ with associated nausea. He has been spitting up blood tinged phlegm this morning but denies gross emesis.   Objective: Vital signs in last 24 hours: Temp:  [97.8 F (36.6 C)-98.1 F (36.7 C)] 98 F (36.7 C) (03/15 0749) Pulse Rate:  [62-119] 113 (03/15 0749) Resp:  [18-20] 20 (03/15 0749) BP: (126-136)/(96-106) 133/98 (03/15 0749) SpO2:  [94 %-97 %] 96 % (03/15 0749) Last BM Date: 04/12/20  Intake/Output from previous day: 03/14 0701 - 03/15 0700 In: 150 [P.O.:150] Out: 475 [Urine:475] Intake/Output this shift: No intake/output data recorded.  PE: Gen:  Alert, NAD, pleasant Heart: Tachycardic  Pulm: normal rate and effort Abd: Soft, ND, NT (reports nausea with palpation of the epigastrium and ruq but denies any pain with light or deep palpation), +BS Ext:  B/l LE pitting edema R>L Psych: A&Ox3  Skin: no rashes noted, warm and dry   Lab Results:  Recent Labs    04/11/20 1140 04/11/20 1841 04/12/20 0752 04/12/20 2046  WBC 6.9  --   --   --   HGB 16.2   < > 15.8 16.3  HCT 50.2   < > 50.4 50.8  PLT 146*  --   --   --    < > = values in this interval not displayed.   BMET Recent Labs    04/12/20 0027 04/13/20 0041  NA 136 136  K 4.6 4.9  CL 105 102  CO2 20* 26  GLUCOSE 120* 126*  BUN 23* 20  CREATININE 0.91 1.26*  CALCIUM 8.5* 8.6*   PT/INR Recent Labs    04/12/20 1552  LABPROT 21.2*  INR 1.9*   CMP     Component Value Date/Time   NA 136 04/13/2020 0041   K 4.9 04/13/2020 0041   CL 102 04/13/2020 0041   CO2 26 04/13/2020 0041   GLUCOSE 126 (H) 04/13/2020 0041   GLUCOSE 90 11/22/2005 1016   BUN 20 04/13/2020 0041   CREATININE 1.26 (H) 04/13/2020 0041   CALCIUM 8.6 (L) 04/13/2020 0041   PROT 6.1 (L) 04/13/2020 0041   ALBUMIN 2.5 (L)  04/13/2020 0041   AST 104 (H) 04/13/2020 0041   ALT 169 (H) 04/13/2020 0041   ALKPHOS 59 04/13/2020 0041   BILITOT 4.5 (H) 04/13/2020 0041   GFRNONAA >60 04/13/2020 0041   GFRAA 108 10/03/2007 1056   Lipase     Component Value Date/Time   LIPASE 201 (H) 04/13/2020 0041       Studies/Results: CT HEAD WO CONTRAST  Result Date: 04/12/2020 CLINICAL DATA:  Altered mental status. EXAM: CT HEAD WITHOUT CONTRAST TECHNIQUE: Contiguous axial images were obtained from the base of the skull through the vertex without intravenous contrast. COMPARISON:  MR head, dated April 09, 2020 FINDINGS: Brain: No evidence of acute infarction, hemorrhage, hydrocephalus, extra-axial collection or mass lesion/mass effect. Vascular: No hyperdense vessel or unexpected calcification. Skull: Normal. Negative for fracture or focal lesion. Sinuses/Orbits: No acute finding. Other: None. IMPRESSION: No acute intracranial pathology. Electronically Signed   By: Virgina Norfolk M.D.   On: 04/12/2020 17:23   NM Hepatobiliary Liver Func  Result Date: 04/12/2020 CLINICAL DATA:  Evaluate for cholecystitis. Nausea and vomiting. Right upper quadrant pain. EXAM: NUCLEAR MEDICINE HEPATOBILIARY IMAGING TECHNIQUE: Sequential images of  the abdomen were obtained out to 60 minutes following intravenous administration of radiopharmaceutical. RADIOPHARMACEUTICALS:  7.5 mCi Tc-61m  Choletec IV COMPARISON:  Abdominal sonogram 04/11/2020 FINDINGS: Prompt uptake and biliary excretion of activity by the liver is seen. Biliary activity passes into small bowel, consistent with patent common bile duct. After 60 minutes no gallbladder activity was visualized. The patient subsequently received 3 mg of morphine, IV. Imaging was performed for an additional 30 minutes without evidence for gallbladder activity. IMPRESSION: 1. Nonvisualization of the gallbladder which is compatible with acute cholecystitis. Electronically Signed   By: Kerby Moors M.D.    On: 04/12/2020 13:00   DG Chest Port 1 View  Result Date: 04/12/2020 CLINICAL DATA:  Evaluate for congestive heart failure. EXAM: PORTABLE CHEST 1 VIEW COMPARISON:  April 01, 2020 FINDINGS: The cardiac silhouette is markedly enlarged and unchanged in size. Both lungs are clear. The visualized skeletal structures are unremarkable. IMPRESSION: 1. Cardiomegaly without acute or active cardiopulmonary disease. Electronically Signed   By: Virgina Norfolk M.D.   On: 04/12/2020 15:33   ECHOCARDIOGRAM COMPLETE  Result Date: 04/12/2020    ECHOCARDIOGRAM REPORT   Patient Name:   Kevin Baxter Date of Exam: 04/12/2020 Medical Rec #:  [id number]          Height:       73.0 in Accession #:    [id number]         Weight:       298.0 lb Date of Birth:  October 05, 1970         BSA:          2.549 m Patient Age:    50 years           BP:           122/99 mmHg Patient Gender: M                  HR:           115 bpm. Exam Location:  Inpatient Procedure: 2D Echo, Cardiac Doppler, Color Doppler and Intracardiac            Opacification Agent                       STAT ECHO Reported to: Dr Mertie Moores on 04/12/2020 2:13:00 PM. Indications:    Elevated Troponin  History:        Patient has no prior history of Echocardiogram examinations.                 Risk Factors:Hypertension and GERD.  Sonographer:    Jonelle Sidle Dance Referring Phys: [id number] [place] IMPRESSIONS  1. There is a large apical thromus measuring 3.6 x 2.3 cm .     Marland Kitchen Left ventricular ejection fraction, by estimation, is <20%. The left ventricle has severely decreased function. The left ventricle demonstrates regional wall motion abnormalities (see scoring diagram/findings for description). The left ventricular internal cavity size was moderately to severely dilated. Left ventricular diastolic function could not be evaluated. There is severe akinesis of the left ventricular, mid-apical apical segment, anteroseptal wall, inferoseptal wall and lateral wall.  2. Right  ventricular systolic function was not well visualized. The right ventricular size is not well visualized.  3. The mitral valve is grossly normal. No evidence of mitral valve regurgitation.  4. The aortic valve is normal in structure. Aortic valve regurgitation is not visualized. No aortic stenosis is present.  5. Aortic dilatation noted. There  is borderline dilatation of the ascending aorta, measuring 39 mm. FINDINGS  Left Ventricle: There is a large apical thromus measuring 3.6 x 2.3 cm. Left ventricular ejection fraction, by estimation, is <20%. The left ventricle has severely decreased function. The left ventricle demonstrates regional wall motion abnormalities. Severe akinesis of the left ventricular, mid-apical apical segment, anteroseptal wall, inferoseptal wall and lateral wall. Definity contrast agent was given IV to delineate the left ventricular endocardial borders. The left ventricular internal cavity size was moderately to severely dilated. There is no left ventricular hypertrophy. Left ventricular diastolic function could not be evaluated. Right Ventricle: The right ventricular size is not well visualized. Right vetricular wall thickness was not well visualized. Right ventricular systolic function was not well visualized. Left Atrium: Left atrial size was not well visualized. Right Atrium: Right atrial size was not well visualized. Pericardium: There is no evidence of pericardial effusion. Mitral Valve: The mitral valve is grossly normal. No evidence of mitral valve regurgitation. Tricuspid Valve: The tricuspid valve is normal in structure. Tricuspid valve regurgitation is mild. Aortic Valve: The aortic valve is normal in structure. Aortic valve regurgitation is not visualized. No aortic stenosis is present. Pulmonic Valve: The pulmonic valve was normal in structure. Pulmonic valve regurgitation is not visualized. Aorta: Aortic dilatation noted. There is borderline dilatation of the ascending aorta,  measuring 39 mm. IAS/Shunts: The atrial septum is grossly normal.  LEFT VENTRICLE PLAX 2D LVIDd:         6.23 cm  Diastology LVIDs:         5.59 cm  LV e' medial:    5.84 cm/s LV PW:         1.38 cm  LV E/e' medial:  14.0 LV IVS:        0.94 cm  LV e' lateral:   6.48 cm/s LVOT diam:     2.30 cm  LV E/e' lateral: 12.6 LV SV:         23 LV SV Index:   9 LVOT Area:     4.15 cm  RIGHT VENTRICLE             IVC RV Basal diam:  3.49 cm     IVC diam: 2.50 cm RV Mid diam:    2.29 cm RV S prime:     10.30 cm/s TAPSE (M-mode): 1.8 cm LEFT ATRIUM         Index LA diam:    4.60 cm 1.80 cm/m  AORTIC VALVE LVOT Vmax:   46.50 cm/s LVOT Vmean:  30.100 cm/s LVOT VTI:    0.055 m  AORTA Ao Root diam: 3.40 cm Ao Asc diam:  3.90 cm MITRAL VALVE MV Area (PHT): 4.70 cm    SHUNTS MV Decel Time: 162 msec    Systemic VTI:  0.05 m MV E velocity: 81.70 cm/s  Systemic Diam: 2.30 cm MV A velocity: 30.60 cm/s MV E/A ratio:  2.67 Mertie Moores MD Electronically signed by Mertie Moores MD Signature Date/Time: 04/12/2020/2:24:59 PM    Final    VAS [place] LOWER EXTREMITY VENOUS (DVT)  Result Date: 04/12/2020  Lower Venous DVT Study Indications: Swelling.  Risk Factors: None identified. Limitations: Body habitus and poor ultrasound/tissue interface. Comparison Study: No prior studies. Performing Technologist: Oliver Hum RVT  Examination Guidelines: A complete evaluation includes B-mode imaging, spectral Doppler, color Doppler, and power Doppler as needed of all accessible portions of each vessel. Bilateral testing is considered an integral part of a complete examination. Limited examinations for  reoccurring indications may be performed as noted. The reflux portion of the exam is performed with the patient in reverse Trendelenburg.  +---------+---------------+---------+-----------+----------+-------------------+ RIGHT    CompressibilityPhasicitySpontaneityPropertiesThrombus Aging       +---------+---------------+---------+-----------+----------+-------------------+ CFV      Full           No       No                                       +---------+---------------+---------+-----------+----------+-------------------+ SFJ      Full                                                             +---------+---------------+---------+-----------+----------+-------------------+ FV Prox  Full                                                             +---------+---------------+---------+-----------+----------+-------------------+ FV Mid   None           No       No                   Acute               +---------+---------------+---------+-----------+----------+-------------------+ FV DistalNone           No       No                   Acute               +---------+---------------+---------+-----------+----------+-------------------+ PFV      Full                                                             +---------+---------------+---------+-----------+----------+-------------------+ POP      Full           Yes      Yes                                      +---------+---------------+---------+-----------+----------+-------------------+ PTV      Full                                                             +---------+---------------+---------+-----------+----------+-------------------+ PERO                                                  Not well visualized +---------+---------------+---------+-----------+----------+-------------------+   +---------+---------------+---------+-----------+----------+--------------+ LEFT  CompressibilityPhasicitySpontaneityPropertiesThrombus Aging +---------+---------------+---------+-----------+----------+--------------+ CFV      Full           Yes      Yes                                 +---------+---------------+---------+-----------+----------+--------------+ SFJ      Full                                                         +---------+---------------+---------+-----------+----------+--------------+ FV Prox  Full                                                        +---------+---------------+---------+-----------+----------+--------------+ FV Mid   Full                                                        +---------+---------------+---------+-----------+----------+--------------+ FV DistalFull                                                        +---------+---------------+---------+-----------+----------+--------------+ PFV      Full                                                        +---------+---------------+---------+-----------+----------+--------------+ POP      Full           Yes      Yes                                 +---------+---------------+---------+-----------+----------+--------------+ PTV      Full                                                        +---------+---------------+---------+-----------+----------+--------------+ PERO     Full                                                        +---------+---------------+---------+-----------+----------+--------------+     Summary: RIGHT: - Findings consistent with acute deep vein thrombosis involving the right femoral vein. - No cystic structure found in the popliteal fossa.  LEFT: - There is no evidence of deep vein thrombosis in the lower extremity. However, portions of this examination were limited- see technologist  comments above.  - No cystic structure found in the popliteal fossa.  *See table(s) above for measurements and observations.    Preliminary    [place] EKG SITE RITE  Result Date: 04/12/2020 If Site Rite image not attached, placement could not be confirmed due to current cardiac rhythm.  US Abdomen Limited RUQ (LIVER/GB)  Result Date: 04/11/2020 CLINICAL DATA:  Pain with nausea and hemoptysis EXAM: ULTRASOUND ABDOMEN LIMITED RIGHT UPPER QUADRANT  COMPARISON:  CT abdomen and pelvis April 08, 2020 FINDINGS: Gallbladder: There is sludge in the gallbladder. No gallstones are evident. Gallbladder wall is thickened. There is no appreciable pericholecystic fluid. No sonographic Murphy sign noted by sonographer. Common bile duct: Diameter: 4 mm. No intrahepatic or extrahepatic biliary duct dilatation evident. Liver: No focal lesion identified. Within normal limits in parenchymal echogenicity. Portal vein is patent on color Doppler imaging with normal direction of blood flow towards the liver. Other: There is a cyst in the upper pole the right kidney measuring 1.9 x 1.7 x 1.5 cm. IMPRESSION: 1. No gallstones evident. There is sludge in the gallbladder with gallbladder wall thickening. Question a degree of acalculus cholecystitis. This finding may warrant nuclear medicine hepatobiliary imaging study to assess for cystic duct patency. 2. Cyst arising from upper pole right kidney measuring 1.9 x 1.7 x 1.5 cm. 3.  Study otherwise unremarkable. Electronically Signed   By: Lowella Grip Baxter M.D.   On: 04/11/2020 14:13    Anti-infectives: Anti-infectives (From admission, onward)   Start     Dose/Rate Route Frequency Ordered Stop   04/12/20 1600  piperacillin-tazobactam (ZOSYN) IVPB 3.375 g        3.375 g 12.5 mL/hr over 240 Minutes Intravenous Every 8 hours 04/12/20 1446         Assessment/Plan HTN GERD  Gastric polyps s/p biopsies on EGD 04/09/20 Bilateral renal cysts - noted on CT 3/10  New HF with new EF <20% and LV dysfunction New LV thrombus R femoral vein DVT Tachycardia   Pancreatitis - Lipase 201. GI following. IVF, bowel rest Acute cholecystitis  Intractable n/v  - HIDA positive for acute cholecystitis. He did not have stones on [place] but did have sludge - Given patient does have sludge on [place] and uptrending T. Bili and Lipase, would consider MRCP to r/o choledocholithiasis and ensure that rising LFT's are not just do to hepatic congestion  from HF - Given patients new HF with EF < 20%, DVT, LV thrombus cardiology feels his cardiac status very tenuous and high risk for GB surgery. Will continue with IV abx and bowel rest as treatment. If patient was to worsen, would likely need IR percutaneous cholecystectomy  - Continue to trend labs. We will follow with you  FEN - NPO, IVF per primary  VTE - SCDs, heparin ID - Zosyn   LOS: 1 day    Jillyn Ledger , [facility] 04/13/2020, 9:44 AM Please see Amion for pager number during day hours 7:00am-4:30pm

## 2020-04-13 NOTE — Telephone Encounter (Signed)
-----   Message from Gatha Mayer, MD sent at 04/13/2020 12:44 PM EDT ----- Cancel that.  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 Patient currently admitted.  See his admission notes.  Still need barium swallow with tablet?

## 2020-04-13 NOTE — Progress Notes (Signed)
Received call from Dr turner  re. PICC line status from [facility]. Informed MD patient   with   peripheral line and heparin gtt  on arrival to unit. Requested  Iv team  consult  stat.  Paged IV team x2   to  follow up .

## 2020-04-13 NOTE — Progress Notes (Signed)
Patient  On 24 hour urine collection, urine noted cloudy . Patient also spit out blood tinged  phlegm , stated this has been noted even before this admission [facility] Endorsed to [facility].

## 2020-04-13 NOTE — H&P (View-Only) (Signed)
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 Radford Pax.  He is a 50 yo male with PMH of GERD, poorly controlled HTN, obesity   Never saw a cardiologist prior to this admission.  He was followed by his PCP for HTN, I can find only one visit in 2015 and 2 visits in February 2022 where this was well controlled.  He was sent for a sleep study with high suspicion for OSA but never went.  HR has been around 100 or more since 2013.    Says before December 2021 he was doing fine, no issues.  After that he reports developing fatigue, shortness of breath, dry cough, abdominal pain and nausea. Denies any chest pain episodes but according to wife would have occasional diaphoretic episodes.  He also states that he has been having palpitations starting in December as well. Son diagnosed with Covid in January but he tested negative and said symptoms developed beforehand.  Also had GI symptoms which he thought were related.    Was seen initially in urgent care on 02/16/20 with persistent dry cough, nausea, abd pain thought to be due to GERD, HR 110 and BP 157/104 at the time.  Started on PPI.    Then presented to the ED on 02/26/20 with epigastric pain, nausea, dyspnea and was also tachycardic.  A CT angio of the chest was performed which was negative for PE and he had a CT abd/pelv which was suspicous for pancreatitis and he was admitted for acute pancreatitis.  Received aggressive IVF hydration and antiemetics then discharged.  He was also noted to have moderate Hgb which may have represented rhabdo as there were no rbc's on microscopy.    Presented to the ED on 3/11 with transient neurological changes after EGD.  Workup was negative for intracranial process.  He was discharged home.  On 3/13 he called  his GI doctor to report he was spitting up blood, having persistent nausea and vomiting, they recommended he go tot he ER and he presented two days later with this, acute cholecystitis, elevated troponin around 80 x2, elevated BNP.  He had an ECHO which demonstrated a large apical LV thrombus and severely decreased EF biventricular failure.  It was reported that his bp was very low on admission but I can't find any objective evidence of this.  Also found to have acute right lower extremity and DVT.    Today he is still feeling fatigued, gets dyspneic with minimal exertion.  Denies chest pain, still having abdominal pain and sputum mixed with blood in his cup.    Review of Systems: [y] = yes, [ ]  = no   General: Weight gain [ ] ; Weight loss [ y]; Anorexia [ ] ; Fatigue [ y]; Fever [ ] ; Chills [ ] ; Weakness Blue.Reese ]  Cardiac: Chest pain/pressure [ ] ; Resting SOB [ ] ; Exertional SOB [y]; Orthopnea [ ] ; Pedal Edema [ ] ; Palpitations Blue.Reese ]; Syncope [ ] ; Presyncope [ ] ; Paroxysmal nocturnal dyspnea[ ]   Pulmonary: Cough Blue.Reese ]; Wheezing[ ] ; Hemoptysis[x ]; Sputum Blue.Reese ]; Snoring [ ]   GI: Vomiting[y ]; Dysphagia[ ] ; Melena[ ] ; Hematochezia [ ] ; Heartburn[ ] ; Abdominal pain Blue.Reese ]; Constipation [ ] ; Diarrhea [ ] ; BRBPR [ ]   GU: Hematuria[ ] ; Dysuria [ ] ; Nocturia[ ]   Vascular: Pain in legs with walking [ ] ; Pain in feet with lying flat [ ] ; Non-healing sores [ ] ; Stroke [ ] ; TIA [ ] ; Slurred speech [ ] ;  Neuro: Headaches[ ] ; Vertigo[ ] ; Seizures[ ] ; Paresthesias[ ] ;Blurred vision [ ] ; Diplopia [ ] ; Vision changes [ ]   Ortho/Skin: Arthritis Blue.Reese ]; Joint pain [ ] ; Muscle pain [ ] ; Joint swelling [ ] ; Back Pain [ ] ; Rash [ ]   Psych: Depression[ ] ; Anxiety[ ]   Heme: Bleeding problems [ ] ; Clotting disorders [ ] ; Anemia [ ]   Endocrine: Diabetes [ ] ; Thyroid dysfunction[ ]   Home Medications Prior to Admission medications   Medication Sig Start Date End Date Taking? Authorizing Provider  allopurinol (ZYLOPRIM) 300 MG tablet  Take 1 tablet (300 mg total) by mouth daily. 12/22/19  Yes Paz, Alda Berthold, MD  buPROPion (WELLBUTRIN) 100 MG tablet Take 1 tablet a day x 1 week, then 1 tab BID Patient taking differently: Take 100 mg by mouth 2 (two) times daily. 03/02/20  Yes Paz, Alda Berthold, MD  dicyclomine (BENTYL) 20 MG tablet Take 1 tablet (20 mg total) by mouth every 6 (six) hours as needed for spasms. Patient taking differently: Take 20 mg by mouth daily. 04/22/19  Yes Gatha Mayer, MD  losartan (COZAAR) 50 MG tablet Take 2 tablets (100 mg total) by mouth daily. 04/08/20  Yes Paz, Alda Berthold, MD  ondansetron (ZOFRAN ODT) 4 MG disintegrating tablet Take 1 tablet (4 mg total) by mouth every 8 (eight) hours as needed for nausea or vomiting. 04/09/20  Yes Carmin Muskrat, MD  sucralfate (CARAFATE) 1 g tablet Take 1 tablet (1 g total) by mouth 4 (four) times daily -  with meals and at bedtime. Patient taking differently: Take 1 g by mouth daily. 04/09/20  Yes Carmin Muskrat, MD  pantoprazole (PROTONIX) 40 MG tablet Take 1 tablet (40 mg total) by mouth daily. Patient not taking: No sig reported 03/22/20   Shelda Pal, DO    Past Medical History: Past Medical History:  Diagnosis Date  . Allergy   . Anxiety   . Arthritis   . Fundic gland polyposis of stomach   . GERD with esophagitis   . Gout   . Hx of adenomatous colonic polyps   . Hypertension     Past Surgical History: Past Surgical History:  Procedure Laterality Date  . COLONOSCOPY  05/2019  . ESOPHAGOGASTRODUODENOSCOPY  05/2019  . HAND SURGERY Right    2006 for a FX    Family History: Family History  Problem Relation Age of Onset  . Cancer Mother        CERVICAL  . Hypertension Father   . Heart failure Father        no MI, d/t etoh  . Diabetes Sister   . Diabetes Sister   . Diabetes Paternal Aunt   . Diabetes Cousin   . Colon cancer Neg Hx   . Prostate cancer Neg Hx   . Stroke Neg Hx   . Esophageal cancer Neg Hx   . Rectal cancer Neg Hx   .  Stomach cancer Neg Hx     Social History: Social History   Socioeconomic History  . Marital status: Married    Spouse name: Not on file  . Number of children: 2  . Years of education: Not on file  . Highest education level: Not on file  Occupational History  . Occupation: works for [employer]  . Smoking status: Never  Smoker  . Smokeless tobacco: Never Used  Vaping Use  . Vaping Use: Never used  Substance and Sexual Activity  . Alcohol use: Yes    Comment: very rare   . Drug use: No  . Sexual activity: Not on file  Other Topics Concern  . Not on file  Social History Narrative   Household: pt, wife daughter (2013),  boy  (2006),    Social Determinants of Radio broadcast assistant Strain: Not on file  Food Insecurity: Not on file  Transportation Needs: Not on file  Physical Activity: Not on file  Stress: Not on file  Social Connections: Not on file    Allergies:  Allergies  Allergen Reactions  . Pseudoephedrine     REACTION: jittery    Objective:    Vital Signs:   Temp:  [97.8 F (36.6 C)-98.1 F (36.7 C)] 98 F (36.7 C) (03/15 0749) Pulse Rate:  [62-119] 113 (03/15 0749) Resp:  [18-20] 20 (03/15 0749) BP: (126-136)/(96-106) 133/98 (03/15 0749) SpO2:  [94 %-97 %] 96 % (03/15 0749) Last BM Date: 04/12/20  Weight change: Filed Weights   04/11/20 1123  Weight: 135.2 kg    Intake/Output:   Intake/Output Summary (Last 24 hours) at 04/13/2020 1001 Last data filed at 04/13/2020 0313 Gross per 24 hour  Intake --  Output 475 ml  Net -475 ml      Physical Exam    General:  Obese mail in no distress. No resp difficulty HEENT: normal Neck: supple. JVP difficult to assess . Carotids 2+ bilat; no bruits. No lymphadenopathy or thyromegaly appreciated. Cor: PMI nondisplaced. Tachycardic but regular rhythm. No rubs, gallops or murmurs. Lungs: clear Abdomen: soft, mildly tender to RUQ but no guarding.  Extremities: + for clubbing of fingers, no  rash, R LE edema 2+, LLE edema trace Neuro: alert & orientedx3  Telemetry   Sinus tach ~110  EKG    3/14: sinus tach rate 120, Left atrial enlargement  Labs   Basic Metabolic Panel: Recent Labs  Lab 04/09/20 1301 04/11/20 1140 04/12/20 0027 04/13/20 0041  NA 138 135 136 136  K 4.7 4.5 4.6 4.9  CL 105 102 105 102  CO2 23 21* 20* 26  GLUCOSE 119* 125* 120* 126*  BUN 18 24* 23* 20  CREATININE 1.14 1.12 0.91 1.26*  CALCIUM 8.3* 8.6* 8.5* 8.6*  MG 2.0 2.0  --   --     Liver Function Tests: Recent Labs  Lab 04/09/20 1301 04/11/20 1140 04/12/20 0027 04/13/20 0041  AST 40 120* 144* 104*  ALT 69* 134* 172* 169*  ALKPHOS 61 64 59 59  BILITOT 3.3* 3.9* 4.0* 4.5*  PROT 6.3* 6.8 6.6 6.1*  ALBUMIN 2.7* 2.9* 2.9* 2.5*   Recent Labs  Lab 04/11/20 1140 04/11/20 1841 04/12/20 0027 04/13/20 0041  LIPASE 107* 135* 155* 201*  AMYLASE  --   --  159*  --    No results for input(s): AMMONIA in the last 168 hours.  CBC: Recent Labs  Lab 04/09/20 1301 04/11/20 1140 04/11/20 1841 04/12/20 0027 04/12/20 0752 04/12/20 2046  WBC 6.9 6.9  --   --   --   --   NEUTROABS 5.6 4.9  --   --   --   --   HGB 16.0 16.2 16.2 16.0 15.8 16.3  HCT 50.8 50.2 51.2 51.4 50.4 50.8  MCV 80.5 79.4*  --   --   --   --   PLT 175 146*  --   --   --   --  Cardiac Enzymes: No results for input(s): CKTOTAL, CKMB, CKMBINDEX, TROPONINI in the last 168 hours.  BNP: BNP (last 3 results) Recent Labs    04/12/20 1216  BNP 1,153.9*    ProBNP (last 3 results) No results for input(s): PROBNP in the last 8760 hours.   CBG: No results for input(s): GLUCAP in the last 168 hours.  Coagulation Studies: Recent Labs    04/12/20 1552  LABPROT 21.2*  INR 1.9*     Imaging   CT HEAD WO CONTRAST  Result Date: 04/12/2020 CLINICAL DATA:  Altered mental status. EXAM: CT HEAD WITHOUT CONTRAST TECHNIQUE: Contiguous axial images were obtained from the base of the skull through the vertex  without intravenous contrast. COMPARISON:  MR head, dated April 09, 2020 FINDINGS: Brain: No evidence of acute infarction, hemorrhage, hydrocephalus, extra-axial collection or mass lesion/mass effect. Vascular: No hyperdense vessel or unexpected calcification. Skull: Normal. Negative for fracture or focal lesion. Sinuses/Orbits: No acute finding. Other: None. IMPRESSION: No acute intracranial pathology. Electronically Signed   By: Virgina Norfolk M.D.   On: 04/12/2020 17:23   NM Hepatobiliary Liver Func  Result Date: 04/12/2020 CLINICAL DATA:  Evaluate for cholecystitis. Nausea and vomiting. Right upper quadrant pain. EXAM: NUCLEAR MEDICINE HEPATOBILIARY IMAGING TECHNIQUE: Sequential images of the abdomen were obtained out to 60 minutes following intravenous administration of radiopharmaceutical. RADIOPHARMACEUTICALS:  7.5 mCi Tc-45m  Choletec IV COMPARISON:  Abdominal sonogram 04/11/2020 FINDINGS: Prompt uptake and biliary excretion of activity by the liver is seen. Biliary activity passes into small bowel, consistent with patent common bile duct. After 60 minutes no gallbladder activity was visualized. The patient subsequently received 3 mg of morphine, IV. Imaging was performed for an additional 30 minutes without evidence for gallbladder activity. IMPRESSION: 1. Nonvisualization of the gallbladder which is compatible with acute cholecystitis. Electronically Signed   By: Kerby Moors M.D.   On: 04/12/2020 13:00   DG Chest Port 1 View  Result Date: 04/12/2020 CLINICAL DATA:  Evaluate for congestive heart failure. EXAM: PORTABLE CHEST 1 VIEW COMPARISON:  April 01, 2020 FINDINGS: The cardiac silhouette is markedly enlarged and unchanged in size. Both lungs are clear. The visualized skeletal structures are unremarkable. IMPRESSION: 1. Cardiomegaly without acute or active cardiopulmonary disease. Electronically Signed   By: Virgina Norfolk M.D.   On: 04/12/2020 15:33   ECHOCARDIOGRAM COMPLETE  Result  Date: 04/12/2020    ECHOCARDIOGRAM REPORT   Patient Name:   Kevin Baxter Date of Exam: 04/12/2020 Medical Rec #:  [id number]          Height:       73.0 in Accession #:    [id number]         Weight:       298.0 lb Date of Birth:  1970-03-09         BSA:          2.549 m Patient Age:    49 years           BP:           122/99 mmHg Patient Gender: M                  HR:           115 bpm. Exam Location:  Inpatient Procedure: 2D Echo, Cardiac Doppler, Color Doppler and Intracardiac            Opacification Agent  STAT ECHO Reported to: Dr Mertie Moores on 04/12/2020 2:13:00 PM. Indications:    Elevated Troponin  History:        Patient has no prior history of Echocardiogram examinations.                 Risk Factors:Hypertension and GERD.  Sonographer:    Jonelle Sidle Dance Referring Phys: [id number] [place] IMPRESSIONS  1. There is a large apical thromus measuring 3.6 x 2.3 cm .     Marland Kitchen Left ventricular ejection fraction, by estimation, is <20%. The left ventricle has severely decreased function. The left ventricle demonstrates regional wall motion abnormalities (see scoring diagram/findings for description). The left ventricular internal cavity size was moderately to severely dilated. Left ventricular diastolic function could not be evaluated. There is severe akinesis of the left ventricular, mid-apical apical segment, anteroseptal wall, inferoseptal wall and lateral wall.  2. Right ventricular systolic function was not well visualized. The right ventricular size is not well visualized.  3. The mitral valve is grossly normal. No evidence of mitral valve regurgitation.  4. The aortic valve is normal in structure. Aortic valve regurgitation is not visualized. No aortic stenosis is present.  5. Aortic dilatation noted. There is borderline dilatation of the ascending aorta, measuring 39 mm. FINDINGS  Left Ventricle: There is a large apical thromus measuring 3.6 x 2.3 cm. Left ventricular ejection  fraction, by estimation, is <20%. The left ventricle has severely decreased function. The left ventricle demonstrates regional wall motion abnormalities. Severe akinesis of the left ventricular, mid-apical apical segment, anteroseptal wall, inferoseptal wall and lateral wall. Definity contrast agent was given IV to delineate the left ventricular endocardial borders. The left ventricular internal cavity size was moderately to severely dilated. There is no left ventricular hypertrophy. Left ventricular diastolic function could not be evaluated. Right Ventricle: The right ventricular size is not well visualized. Right vetricular wall thickness was not well visualized. Right ventricular systolic function was not well visualized. Left Atrium: Left atrial size was not well visualized. Right Atrium: Right atrial size was not well visualized. Pericardium: There is no evidence of pericardial effusion. Mitral Valve: The mitral valve is grossly normal. No evidence of mitral valve regurgitation. Tricuspid Valve: The tricuspid valve is normal in structure. Tricuspid valve regurgitation is mild. Aortic Valve: The aortic valve is normal in structure. Aortic valve regurgitation is not visualized. No aortic stenosis is present. Pulmonic Valve: The pulmonic valve was normal in structure. Pulmonic valve regurgitation is not visualized. Aorta: Aortic dilatation noted. There is borderline dilatation of the ascending aorta, measuring 39 mm. IAS/Shunts: The atrial septum is grossly normal.  LEFT VENTRICLE PLAX 2D LVIDd:         6.23 cm  Diastology LVIDs:         5.59 cm  LV e' medial:    5.84 cm/s LV PW:         1.38 cm  LV E/e' medial:  14.0 LV IVS:        0.94 cm  LV e' lateral:   6.48 cm/s LVOT diam:     2.30 cm  LV E/e' lateral: 12.6 LV SV:         23 LV SV Index:   9 LVOT Area:     4.15 cm  RIGHT VENTRICLE             IVC RV Basal diam:  3.49 cm     IVC diam: 2.50 cm RV Mid diam:    2.29 cm  RV S prime:     10.30 cm/s TAPSE (M-mode):  1.8 cm LEFT ATRIUM         Index LA diam:    4.60 cm 1.80 cm/m  AORTIC VALVE LVOT Vmax:   46.50 cm/s LVOT Vmean:  30.100 cm/s LVOT VTI:    0.055 m  AORTA Ao Root diam: 3.40 cm Ao Asc diam:  3.90 cm MITRAL VALVE MV Area (PHT): 4.70 cm    SHUNTS MV Decel Time: 162 msec    Systemic VTI:  0.05 m MV E velocity: 81.70 cm/s  Systemic Diam: 2.30 cm MV A velocity: 30.60 cm/s MV E/A ratio:  2.67 Mertie Moores MD Electronically signed by Mertie Moores MD Signature Date/Time: 04/12/2020/2:24:59 PM    Final    VAS [place] LOWER EXTREMITY VENOUS (DVT)  Result Date: 04/12/2020  Lower Venous DVT Study Indications: Swelling.  Risk Factors: None identified. Limitations: Body habitus and poor ultrasound/tissue interface. Comparison Study: No prior studies. Performing Technologist: Oliver Hum RVT  Examination Guidelines: A complete evaluation includes B-mode imaging, spectral Doppler, color Doppler, and power Doppler as needed of all accessible portions of each vessel. Bilateral testing is considered an integral part of a complete examination. Limited examinations for reoccurring indications may be performed as noted. The reflux portion of the exam is performed with the patient in reverse Trendelenburg.  +---------+---------------+---------+-----------+----------+-------------------+ RIGHT    CompressibilityPhasicitySpontaneityPropertiesThrombus Aging      +---------+---------------+---------+-----------+----------+-------------------+ CFV      Full           No       No                                       +---------+---------------+---------+-----------+----------+-------------------+ SFJ      Full                                                             +---------+---------------+---------+-----------+----------+-------------------+ FV Prox  Full                                                             +---------+---------------+---------+-----------+----------+-------------------+ FV Mid    None           No       No                   Acute               +---------+---------------+---------+-----------+----------+-------------------+ FV DistalNone           No       No                   Acute               +---------+---------------+---------+-----------+----------+-------------------+ PFV      Full                                                             +---------+---------------+---------+-----------+----------+-------------------+  POP      Full           Yes      Yes                                      +---------+---------------+---------+-----------+----------+-------------------+ PTV      Full                                                             +---------+---------------+---------+-----------+----------+-------------------+ PERO                                                  Not well visualized +---------+---------------+---------+-----------+----------+-------------------+   +---------+---------------+---------+-----------+----------+--------------+ LEFT     CompressibilityPhasicitySpontaneityPropertiesThrombus Aging +---------+---------------+---------+-----------+----------+--------------+ CFV      Full           Yes      Yes                                 +---------+---------------+---------+-----------+----------+--------------+ SFJ      Full                                                        +---------+---------------+---------+-----------+----------+--------------+ FV Prox  Full                                                        +---------+---------------+---------+-----------+----------+--------------+ FV Mid   Full                                                        +---------+---------------+---------+-----------+----------+--------------+ FV DistalFull                                                         +---------+---------------+---------+-----------+----------+--------------+ PFV      Full                                                        +---------+---------------+---------+-----------+----------+--------------+ POP      Full           Yes      Yes                                 +---------+---------------+---------+-----------+----------+--------------+  PTV      Full                                                        +---------+---------------+---------+-----------+----------+--------------+ PERO     Full                                                        +---------+---------------+---------+-----------+----------+--------------+     Summary: RIGHT: - Findings consistent with acute deep vein thrombosis involving the right femoral vein. - No cystic structure found in the popliteal fossa.  LEFT: - There is no evidence of deep vein thrombosis in the lower extremity. However, portions of this examination were limited- see technologist comments above.  - No cystic structure found in the popliteal fossa.  *See table(s) above for measurements and observations.    Preliminary    [place] EKG SITE RITE  Result Date: 04/12/2020 If Site Rite image not attached, placement could not be confirmed due to current cardiac rhythm.     Medications:     Current Medications: . allopurinol  300 mg Oral Daily  . buPROPion  100 mg Oral BID  . Chlorhexidine Gluconate Cloth  6 each Topical Daily  . metoCLOPramide (REGLAN) injection  5 mg Intravenous Q6H  . morphine      . pantoprazole (PROTONIX) IV  40 mg Intravenous Q12H  . sodium chloride flush  10-40 mL Intracatheter Q12H  . sucralfate  1 g Oral TID WC & HS  . thiamine injection  100 mg Intravenous Daily     Infusions: . heparin 2,300 Units/hr (04/13/20 0431)  . piperacillin-tazobactam (ZOSYN)  IV 3.375 g (04/13/20 0313)  . promethazine (PHENERGAN) injection          Assessment/Plan   Systolic Biventricular  CHF: -ECHO with large LV thromus, EF <20%, RV dysfunction, dilated LV, no real valve disease, + for RWMA, diastolic function not evaluated -do not suspect this is acute likely began months ago -NYHA class Baxter symptoms -coox 60% but with hgb of 16 calculated cardiac index 1.5 -does have uncontrolled HTN and obesity with likely OSA but systolic dysfunction  out of proportion to degree of HTN -tested many times for covid and never positive, Flu neg, HIV negative not sure if a RVP would be useful this far out but will order -TSH normal -less likely amyloid or infiltrative disease with dilation and minimal hypertrophy, MM panel pending -no calcifications of coronaries on imaging, risk factors include obesity, HTN, early erectile dysfunction, RWMA on echo -Needs L/RHC later this week -Need to rule out sarcoid with clubbing, dilated cardiomyopathy and GI findings -needs outpatient sleep study likely has OSA  -Cardiac MRI tomorrow -with palpitation history may benefit from cardiac monitor going forward -cvp 17, start on milrinone and lasix gtt -follow coox, renal function  LV thrombus: -2.3 x 3.6 cm apical thrombus -currently on heparin, keep for now transition to oral anticoagulant later  Tachycardia: -seems to be long standing since 2013 around 100-105 but worse during this admission  Acute Cholecystitis: -conservative management with abx for now, surgery following  Acute RLE DVT: -on heparin -hypercoagulability workup pending, likely done while on heparin  which will affect some results  ?hemoptysis:  -he describes blood and sputum from coughing more so than related to his vomiting, this also correlates with the findings on recent EGD -low risk for TB  -may also have PE, he is already on anticoagulation determine timing for workup.    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 I personally reviewed laboratory data, imaging studies and relevant notes. I independently examined the patient and formulated the important aspects of the plan. I have edited the note to reflect any of my changes or salient points. I have personally discussed the plan with the patient and/or family.  50 y/o male with several month h/o SOB, cough, abdomina symptoms. Found t have EF 10% with large LV apical thrombus and severe RV dysfunction. Remains SOB and orthopneic. HR 120s. No CP  General:  Lying in bed. Apprhensive HEENT: normal Neck: supple. JVP to ear (18) Carotids 2+ bilat; no bruits. No lymphadenopathy or thryomegaly appreciated. Cor: PMI nondisplaced. Regular tachy +s3  Lungs: clear Abdomen: soft, minimally tender, nondistended. No hepatosplenomegaly. No bruits or masses. Good bowel sounds. Extremities: no cyanosis, clubbing, rash, 2+ edema cool  Neuro: alert & orientedx3, cranial nerves grossly intact. moves all 4 extremities w/o difficulty. Affect pleasant  He has low output HF with marked volume overload and large LV clot. Suspect all of his symptoms are related to HF and doubt he has cholecystitis. Will start milrinone and lasix gtt. Continue heparin. Possible R/L cath tomorrow or later if unable to lie flat. He is at very high risk for decompensation and will move to ICU if he begins to deteriorate.    CRITICAL CARE Performed by: Glori Bickers  Total critical care time: 45 minutes  Critical care time was exclusive of separately billable procedures and treating other patients.  Critical care was necessary to treat or prevent imminent or life-threatening deterioration.  Critical care was time spent personally by  me (independent of midlevel providers or residents) on the following activities: development of treatment plan with patient and/or surrogate as well as nursing, discussions with consultants, evaluation of patient's response to treatment, examination of patient, obtaining history from patient or surrogate, ordering and performing treatments and interventions, ordering and review of laboratory studies, ordering and review of radiographic studies, pulse oximetry and re-evaluation of patient's condition.  Glori Bickers, MD  11:06 PM

## 2020-04-13 NOTE — Progress Notes (Signed)
   04/13/20 0352  Assess: MEWS Score  Temp 97.8 F (36.6 C)  BP (!) 126/101  Pulse Rate (!) 113  ECG Heart Rate (!) 113  Resp 19  Level of Consciousness Alert  SpO2 94 %  O2 Device Room Air  Assess: MEWS Score  MEWS Temp 0  MEWS Systolic 0  MEWS Pulse 2  MEWS RR 0  MEWS LOC 0  MEWS Score 2  MEWS Score Color Yellow  Assess: if the MEWS score is Yellow or Red  Were vital signs taken at a resting state? Yes  Focused Assessment No change from prior assessment  Early Detection of Sepsis Score *See Row Information* Low  MEWS guidelines implemented *See Row Information* Yes  Treat  MEWS Interventions Escalated (See documentation below)  Pain Scale 0-10  Pain Score 0  Take Vital Signs  Increase Vital Sign Frequency  Yellow: Q 2hr X 2 then Q 4hr X 2, if remains yellow, continue Q 4hrs  Escalate  MEWS: Escalate Yellow: discuss with charge nurse/RN and consider discussing with provider and RRT  Notify: Charge Nurse/RN  Name of Charge Nurse/RN Notified Chaney Born, RN  Date Charge Nurse/RN Notified 04/13/20  Time Charge Nurse/RN Notified 0354  Document  Patient Outcome Other (Comment) (Pt stable & remains on department)  Progress note created (see row info) Yes

## 2020-04-13 NOTE — Progress Notes (Signed)
[place] for Heparin Indication: DVT and apical thrombus  Allergies  Allergen Reactions  . Pseudoephedrine     REACTION: jittery    Patient Measurements: Height: 6\' 1"  (185.4 cm) Weight: 135.2 kg (298 lb) IBW/kg (Calculated) : 79.9 Heparin Dosing Weight: 110 kg  Vital Signs: Temp Source: Oral (03/14 2345) BP: 136/97 (03/14 2345) Pulse Rate: 119 (03/14 2345)  Labs: Recent Labs    04/11/20 1140 04/11/20 1841 04/11/20 2111 04/12/20 0027 04/12/20 0752 04/12/20 1552 04/12/20 2046 04/13/20 0041  HGB 16.2 16.2  --  16.0 15.8  --  16.3  --   HCT 50.2 51.2  --  51.4 50.4  --  50.8  --   PLT 146*  --   --   --   --   --   --   --   APTT  --   --   --   --   --  35  --   --   LABPROT  --   --   --   --   --  21.2*  --   --   INR  --   --   --   --   --  1.9*  --   --   HEPARINUNFRC  --   --   --   --   --   --   --  0.24*  CREATININE 1.12  --   --  0.91  --   --   --   --   TROPONINIHS 42* 48* 38*  --   --   --   --   --     Estimated Creatinine Clearance: 138.6 mL/min (by C-G formula based on SCr of 0.91 mg/dL).  Assessment: 50 y.o. male with RLE DVT and apical thrombus for heparin  Goal of Therapy:  Heparin level 0.3-0.7 units/ml Monitor platelets by anticoagulation protocol: Yes   Plan:  Increase Heparin 2300 units/hr Check heparin level in 8 hours.   Phillis Knack, PharmD, BCPS  04/13/2020,1:26 AM

## 2020-04-13 NOTE — Progress Notes (Signed)
Peripherally Inserted Central Catheter Placement  The IV Nurse has discussed with the patient and/or persons authorized to consent for the patient, the purpose of this procedure and the potential benefits and risks involved with this procedure.  The benefits include less needle sticks, lab draws from the catheter, and the patient may be discharged home with the catheter. Risks include, but not limited to, infection, bleeding, blood clot (thrombus formation), and puncture of an artery; nerve damage and irregular heartbeat and possibility to perform a PICC exchange if needed/ordered by physician.  Alternatives to this procedure were also discussed.  Bard Power PICC patient education guide, fact sheet on infection prevention and patient information card has been provided to patient /or left at bedside.    PICC Placement Documentation  PICC Triple Lumen 79/72/82 PICC Right Basilic 47 cm 0 cm (Active)  Indication for Insertion or Continuance of Line Prolonged intravenous therapies 04/13/20 0220  Exposed Catheter (cm) 0 cm 04/13/20 0220  Site Assessment Clean;Dry;Intact 04/13/20 0220  Lumen #1 Status Capped (Central line);Blood return noted;Flushed 04/13/20 0220  Lumen #2 Status Capped (Central line);Blood return noted;Flushed 04/13/20 0220  Lumen #3 Status Capped (Central line);Flushed;Blood return noted 04/13/20 0220  Dressing Type Transparent;Occlusive 04/13/20 0220  Dressing Status Intact;Clean;Dry 04/13/20 0220  Antimicrobial disc in place? Yes 04/13/20 0220  Safety Lock Not Applicable 07/01/54 1537  Line Care Connections checked and tightened 04/13/20 0220  Line Adjustment (NICU/IV Team Only) No 04/13/20 0220  Dressing Intervention New dressing 04/13/20 0220  Dressing Change Due 04/20/20 04/13/20 0220       [place], Cathlyn Parsons 04/13/2020, 2:44 AM

## 2020-04-13 NOTE — Progress Notes (Signed)
PROGRESS NOTE    Kevin Baxter  NWG:[id number] DOB: 1971/01/14 DOA: 04/11/2020 PCP: Colon Branch, MD   Brief Narrative: 50 year old with past medical history significant for hypertension, gout, depression, morbid obesity, GERD, history of polyposis of his stomach and colonic polyps, report of esophageal dysphagia who has had abdominal pain, nausea and vomiting for several weeks.  He had a recent admission from 1/27 until 1/30 for similar complaints and treated for acute pancreatitis with lipase at 82, CT scan abdomen suggestive of acute pancreatitis.  Patient underwent further work-up for the same symptoms, underwent endoscopy on 04/09/2020 which did not reveal the cause of his dysphagia, after the endoscopy he had an episode of altered mental status and strokelike symptoms.  At that time in the ED he had an MRI that was negative for stroke and he was discharged home.  Patient continued to have persistent nausea and vomiting, and patient was advised to present to the ED for further evaluation.  In the ED he was found to be tachycardic, elevation of AST and ALT, elevated lipase, positive troponin.  Ultrasound abdomen showed gallbladder sludge.  Patient was admitted for further evaluation.  She was found to have new onset systolic cardiomyopathy, ejection fraction less than 20%, large apical thrombus, new right acute DVT, chronic cholecystitis and pancreatitis.  He was transferred to [facility] on for further evaluation by cardiology.   Assessment & Plan:   Principal Problem:   Intractable nausea and vomiting Active Problems:   Essential hypertension   Morbid obesity (HCC)   Pancreatitis, acute   Weight loss, unintentional   Abnormal LFTs   Localized swelling of right lower extremity   Multiple gastric polyps   Poor responsiveness after MAC anesthesia 04/09/2020   Hematemesis with nausea   Mural thrombus of left ventricle   Left ventricular ejection fraction less than 20%   Acute  acalculous cholecystitis   Mural thrombus of cardiac apex   Acute deep vein thrombosis (DVT) of right femoral vein (HCC)   1-intractable nausea and vomiting, dehydration, lactic acidosis the setting of dehydration: -This could be related to chronic cholecystitis. -Ultrasound showed gallbladder sludge.  HIDA scan consistent with acute cholecystitis. -Surgery consulted, recommended IV antibiotics, if no improvement in 24 to 48 hours patient will require percutaneous cholecystostomy tube placement.  No plan for surgery at this point due to poor cardiac function. -Continue with IV PPI, Zofran and Phenergan as needed -Continue n.p.o. status.  Discussed with cardiology they will take care of fluids if needed.   2-Acute Systolic Heart Failure ejection fraction less than 20%, Large Apical Thrombus 3.6 x 2.3,  Right acute DVT: -Continue with heparin drip. -Continue to monitor hemoptysis, blood failure. -Appreciate heart failure team evaluation. -Patient had a CT head without contrast prior to starting Heparin per neurology evaluation due to recent episode of altered mental status. CT head negative.  -Heart failure team following -Hypercoagulable panel sent for evaluation of acute DVT.  Patient will  need appropriate screening for malignancy for age.  3-Dysphagia, weight loss; -Patient follows with Dr. Carlean Purl, had a recent endoscopy on 3/11. -GI was consulted and signed off  4-transaminases and hyperbilirubinemia: -Could be related to cholecystitis vs HF -GI signed off -Viral hepatitis panel:pending  5-Mild elevation of troponin: Likely secondary to heart failure 6-Prediabetic;  hemoglobin A1c 6.3 Diet education 7-History of gout: Continue allopurinol Obesity: BMI 39: Will benefit from lifestyle modification  8-episode of altered mental status: After endoscopy on 3/11 could be related to  anesthesia side effect.  MRI was negative for stroke.  On thiamine daily.  B1 pending.      Nutrition Problem: Inadequate oral intake Etiology: nausea,vomiting,poor appetite    Signs/Symptoms: per patient/family report    Interventions: Refer to RD note for recommendations  Estimated body mass index is 39.32 kg/m as calculated from the following:   Height as of this encounter: _0  (1.854 m).   Weight as of this encounter: 135.2 kg.   DVT prophylaxis: Heparin drip Code Status: Full code Family Communication: Wife and mother who were at bedside Disposition Plan:  Status is: Inpatient  Remains inpatient appropriate because:IV treatments appropriate due to intensity of illness or inability to take PO   Dispo: The patient is from: Home              Anticipated d/c is to: Home              Patient currently is not medically stable to d/c.   Difficult to place patient No        Consultants:   Cardiology  Surgery  GI  Procedures:     Antimicrobials:    Subjective: Very pleasant 50 year old still having nausea and had a bad episode of dry heaving this morning.  He report abdominal pain after he took allopurinol this morning on empty stomach.  He still coughing a small amount of blood maybe every 4 hours. He denies chest pain. He had a bowel movement yesterday.  Wife thinks that his breathing better.  Objective: Vitals:   04/13/20 0352 04/13/20 0605 04/13/20 0615 04/13/20 0749  BP: (!) 126/101  (!) 129/96 (!) 133/98  Pulse: (!) 113 (!) 114 (!) 114 (!) 113  Resp: _1 Temp: 97.8 F (36.6 C)  98.1 F (36.7 C) 98 F (36.7 C)  TempSrc: Oral   Oral  SpO2: 94% 94% 96% 96%  Weight:      Height:        Intake/Output Summary (Last 24 hours) at 04/13/2020 0826 Last data filed at 04/13/2020 0313 Gross per 24 hour  Intake 150 ml  Output 475 ml  Net -325 ml   Filed Weights   04/11/20 1123  Weight: 135.2 kg    Examination:  General exam: Appears calm and comfortable  Respiratory system: Normal respiratory effort, crackles at the  bases Cardiovascular system: S1 & S2 heard, RRR.  Gastrointestinal system: Abdomen is nondistended, soft and nontender. No organomegaly or masses felt. Normal bowel sounds heard. Central nervous system: Alert and oriented. No focal neurological deficits. Extremities: Symmetric 5 x 5 power.  Right lower extremity with edema   Data Reviewed: I have personally reviewed following labs and imaging studies  CBC: Recent Labs  Lab 04/09/20 1301 04/11/20 1140 04/11/20 1841 04/12/20 0027 04/12/20 0752 04/12/20 2046  WBC 6.9 6.9  --   --   --   --   NEUTROABS 5.6 4.9  --   --   --   --   HGB 16.0 16.2 16.2 16.0 15.8 16.3  HCT 50.8 50.2 51.2 51.4 50.4 50.8  MCV 80.5 79.4*  --   --   --   --   PLT 175 146*  --   --   --   --    Basic Metabolic Panel: Recent Labs  Lab 04/09/20 1301 04/11/20 1140 04/12/20 0027 04/13/20 0041  NA 138 135 136 136  K 4.7 4.5 4.6 4.9  CL 105 102 105 102  CO2  23 21* 20* 26  GLUCOSE 119* 125* 120* 126*  BUN 18 24* 23* 20  CREATININE 1.14 1.12 0.91 1.26*  CALCIUM 8.3* 8.6* 8.5* 8.6*  MG 2.0 2.0  --   --    GFR: Estimated Creatinine Clearance: 100.1 mL/min (A) (by C-G formula based on SCr of 1.26 mg/dL (H)). Liver Function Tests: Recent Labs  Lab 04/09/20 1301 04/11/20 1140 04/12/20 0027 04/13/20 0041  AST 40 120* 144* 104*  ALT 69* 134* 172* 169*  ALKPHOS 61 64 59 59  BILITOT 3.3* 3.9* 4.0* 4.5*  PROT 6.3* 6.8 6.6 6.1*  ALBUMIN 2.7* 2.9* 2.9* 2.5*   Recent Labs  Lab 04/11/20 1140 04/11/20 1841 04/12/20 0027 04/13/20 0041  LIPASE 107* 135* 155* 201*  AMYLASE  --   --  159*  --    No results for input(s): AMMONIA in the last 168 hours. Coagulation Profile: Recent Labs  Lab 04/12/20 1552  INR 1.9*   Cardiac Enzymes: No results for input(s): CKTOTAL, CKMB, CKMBINDEX, TROPONINI in the last 168 hours. BNP (last 3 results) No results for input(s): PROBNP in the last 8760 hours. HbA1C: Recent Labs    04/11/20 1844  HGBA1C 6.3*    CBG: No results for input(s): GLUCAP in the last 168 hours. Lipid Profile: No results for input(s): CHOL, HDL, LDLCALC, TRIG, CHOLHDL, LDLDIRECT in the last 72 hours. Thyroid Function Tests: Recent Labs    04/12/20 0027  TSH 2.246   Anemia Panel: Recent Labs    04/11/20 1841 04/12/20 1552  VITAMINB12 702  --   FERRITIN  --  434*   Sepsis Labs: Recent Labs  Lab 04/11/20 1841 04/11/20 2111  LATICACIDVEN 2.2* 2.6*    Recent Results (from the past 240 hour(s))  Resp Panel by RT-PCR (Flu A&B, Covid) Nasopharyngeal Swab     Status: None   Collection Time: 04/11/20 11:40 AM   Specimen: Nasopharyngeal Swab; Nasopharyngeal(NP) swabs in vial transport medium  Result Value Ref Range Status   SARS Coronavirus 2 by RT PCR NEGATIVE NEGATIVE Final    Comment: (NOTE) SARS-CoV-2 target nucleic acids are NOT DETECTED.  The SARS-CoV-2 RNA is generally detectable in upper respiratory specimens during the acute phase of infection. The lowest concentration of SARS-CoV-2 viral copies this assay can detect is 138 copies/mL. A negative result does not preclude SARS-Cov-2 infection and should not be used as the sole basis for treatment or other patient management decisions. A negative result may occur with  improper specimen collection/handling, submission of specimen other than nasopharyngeal swab, presence of viral mutation(s) within the areas targeted by this assay, and inadequate number of viral copies(<138 copies/mL). A negative result must be combined with clinical observations, patient history, and epidemiological information. The expected result is Negative.  Fact Sheet for Patients:  EntrepreneurPulse.com.au  Fact Sheet for Healthcare Providers:  IncredibleEmployment.be  This test is no t yet approved or cleared by the [place] FDA and  has been authorized for detection and/or diagnosis of SARS-CoV-2 by FDA under an Emergency Use  Authorization (EUA). This EUA will remain  in effect (meaning this test can be used) for the duration of the COVID-19 declaration under Section 564(b)(1) of the Act, 21 U.S.C.section 360bbb-3(b)(1), unless the authorization is terminated  or revoked sooner.       Influenza A by PCR NEGATIVE NEGATIVE Final   Influenza B by PCR NEGATIVE NEGATIVE Final    Comment: (NOTE) The Xpert Xpress SARS-CoV-2/FLU/RSV plus assay is intended as an aid in the  diagnosis of influenza from Nasopharyngeal swab specimens and should not be used as a sole basis for treatment. Nasal washings and aspirates are unacceptable for Xpert Xpress SARS-CoV-2/FLU/RSV testing.  Fact Sheet for Patients: EntrepreneurPulse.com.au  Fact Sheet for Healthcare Providers: IncredibleEmployment.be  This test is not yet approved or cleared by the [place] FDA and has been authorized for detection and/or diagnosis of SARS-CoV-2 by FDA under an Emergency Use Authorization (EUA). This EUA will remain in effect (meaning this test can be used) for the duration of the COVID-19 declaration under Section 564(b)(1) of the Act, 21 U.S.C. section 360bbb-3(b)(1), unless the authorization is terminated or revoked.  Performed at [facility], [address] [place], [place] [address]          Radiology Studies: CT HEAD WO CONTRAST  Result Date: 04/12/2020 CLINICAL DATA:  Altered mental status. EXAM: CT HEAD WITHOUT CONTRAST TECHNIQUE: Contiguous axial images were obtained from the base of the skull through the vertex without intravenous contrast. COMPARISON:  MR head, dated April 09, 2020 FINDINGS: Brain: No evidence of acute infarction, hemorrhage, hydrocephalus, extra-axial collection or mass lesion/mass effect. Vascular: No hyperdense vessel or unexpected calcification. Skull: Normal. Negative for fracture or focal lesion. Sinuses/Orbits: No acute finding. Other: None.  IMPRESSION: No acute intracranial pathology. Electronically Signed   By: Virgina Norfolk M.D.   On: 04/12/2020 17:23   NM Hepatobiliary Liver Func  Result Date: 04/12/2020 CLINICAL DATA:  Evaluate for cholecystitis. Nausea and vomiting. Right upper quadrant pain. EXAM: NUCLEAR MEDICINE HEPATOBILIARY IMAGING TECHNIQUE: Sequential images of the abdomen were obtained out to 60 minutes following intravenous administration of radiopharmaceutical. RADIOPHARMACEUTICALS:  7.5 mCi Tc-79m Choletec IV COMPARISON:  Abdominal sonogram 04/11/2020 FINDINGS: Prompt uptake and biliary excretion of activity by the liver is seen. Biliary activity passes into small bowel, consistent with patent common bile duct. After 60 minutes no gallbladder activity was visualized. The patient subsequently received 3 mg of morphine, IV. Imaging was performed for an additional 30 minutes without evidence for gallbladder activity. IMPRESSION: 1. Nonvisualization of the gallbladder which is compatible with acute cholecystitis. Electronically Signed   By: TKerby MoorsM.D.   On: 04/12/2020 13:00   DG Chest Port 1 View  Result Date: 04/12/2020 CLINICAL DATA:  Evaluate for congestive heart failure. EXAM: PORTABLE CHEST 1 VIEW COMPARISON:  April 01, 2020 FINDINGS: The cardiac silhouette is markedly enlarged and unchanged in size. Both lungs are clear. The visualized skeletal structures are unremarkable. IMPRESSION: 1. Cardiomegaly without acute or active cardiopulmonary disease. Electronically Signed   By: TVirgina NorfolkM.D.   On: 04/12/2020 15:33   ECHOCARDIOGRAM COMPLETE  Result Date: 04/12/2020    ECHOCARDIOGRAM REPORT   Patient Name:   LHAZEM KENNERIII Date of Exam: 04/12/2020 Medical Rec #:  [id number]         Height:       73.0 in Accession #:    [id number]        Weight:       298.0 lb Date of Birth:  [birth year]        BSA:          2.549 m Patient Age:    89years           BP:           122/99 mmHg Patient Gender: M                   HR:  115 bpm. Exam Location:  Inpatient Procedure: 2D Echo, Cardiac Doppler, Color Doppler and Intracardiac            Opacification Agent                       STAT ECHO Reported to: Dr Mertie Moores on 04/12/2020 2:13:00 PM. Indications:    Elevated Troponin  History:        Patient has no prior history of Echocardiogram examinations.                 Risk Factors:Hypertension and GERD.  Sonographer:    Jonelle Sidle Dance Referring Phys: [id number] [place] IMPRESSIONS  1. There is a large apical thromus measuring 3.6 x 2.3 cm .     Marland Kitchen Left ventricular ejection fraction, by estimation, is <20%. The left ventricle has severely decreased function. The left ventricle demonstrates regional wall motion abnormalities (see scoring diagram/findings for description). The left ventricular internal cavity size was moderately to severely dilated. Left ventricular diastolic function could not be evaluated. There is severe akinesis of the left ventricular, mid-apical apical segment, anteroseptal wall, inferoseptal wall and lateral wall.  2. Right ventricular systolic function was not well visualized. The right ventricular size is not well visualized.  3. The mitral valve is grossly normal. No evidence of mitral valve regurgitation.  4. The aortic valve is normal in structure. Aortic valve regurgitation is not visualized. No aortic stenosis is present.  5. Aortic dilatation noted. There is borderline dilatation of the ascending aorta, measuring 39 mm. FINDINGS  Left Ventricle: There is a large apical thromus measuring 3.6 x 2.3 cm. Left ventricular ejection fraction, by estimation, is <20%. The left ventricle has severely decreased function. The left ventricle demonstrates regional wall motion abnormalities. Severe akinesis of the left ventricular, mid-apical apical segment, anteroseptal wall, inferoseptal wall and lateral wall. Definity contrast agent was given IV to delineate the left ventricular endocardial borders.  The left ventricular internal cavity size was moderately to severely dilated. There is no left ventricular hypertrophy. Left ventricular diastolic function could not be evaluated. Right Ventricle: The right ventricular size is not well visualized. Right vetricular wall thickness was not well visualized. Right ventricular systolic function was not well visualized. Left Atrium: Left atrial size was not well visualized. Right Atrium: Right atrial size was not well visualized. Pericardium: There is no evidence of pericardial effusion. Mitral Valve: The mitral valve is grossly normal. No evidence of mitral valve regurgitation. Tricuspid Valve: The tricuspid valve is normal in structure. Tricuspid valve regurgitation is mild. Aortic Valve: The aortic valve is normal in structure. Aortic valve regurgitation is not visualized. No aortic stenosis is present. Pulmonic Valve: The pulmonic valve was normal in structure. Pulmonic valve regurgitation is not visualized. Aorta: Aortic dilatation noted. There is borderline dilatation of the ascending aorta, measuring 39 mm. IAS/Shunts: The atrial septum is grossly normal.  LEFT VENTRICLE PLAX 2D LVIDd:         6.23 cm  Diastology LVIDs:         5.59 cm  LV e' medial:    5.84 cm/s LV PW:         1.38 cm  LV E/e' medial:  14.0 LV IVS:        0.94 cm  LV e' lateral:   6.48 cm/s LVOT diam:     2.30 cm  LV E/e' lateral: 12.6 LV SV:         23 LV SV  Index:   9 LVOT Area:     4.15 cm  RIGHT VENTRICLE             IVC RV Basal diam:  3.49 cm     IVC diam: 2.50 cm RV Mid diam:    2.29 cm RV S prime:     10.30 cm/s TAPSE (M-mode): 1.8 cm LEFT ATRIUM         Index LA diam:    4.60 cm 1.80 cm/m  AORTIC VALVE LVOT Vmax:   46.50 cm/s LVOT Vmean:  30.100 cm/s LVOT VTI:    0.055 m  AORTA Ao Root diam: 3.40 cm Ao Asc diam:  3.90 cm MITRAL VALVE MV Area (PHT): 4.70 cm    SHUNTS MV Decel Time: 162 msec    Systemic VTI:  0.05 m MV E velocity: 81.70 cm/s  Systemic Diam: 2.30 cm MV A velocity: 30.60  cm/s MV E/A ratio:  2.67 Mertie Moores MD Electronically signed by Mertie Moores MD Signature Date/Time: 04/12/2020/2:24:59 PM    Final    VAS [place] LOWER EXTREMITY VENOUS (DVT)  Result Date: 04/12/2020  Lower Venous DVT Study Indications: Swelling.  Risk Factors: None identified. Limitations: Body habitus and poor ultrasound/tissue interface. Comparison Study: No prior studies. Performing Technologist: Oliver Hum RVT  Examination Guidelines: A complete evaluation includes B-mode imaging, spectral Doppler, color Doppler, and power Doppler as needed of all accessible portions of each vessel. Bilateral testing is considered an integral part of a complete examination. Limited examinations for reoccurring indications may be performed as noted. The reflux portion of the exam is performed with the patient in reverse Trendelenburg.  +---------+---------------+---------+-----------+----------+-------------------+ RIGHT    CompressibilityPhasicitySpontaneityPropertiesThrombus Aging      +---------+---------------+---------+-----------+----------+-------------------+ CFV      Full           No       No                                       +---------+---------------+---------+-----------+----------+-------------------+ SFJ      Full                                                             +---------+---------------+---------+-----------+----------+-------------------+ FV Prox  Full                                                             +---------+---------------+---------+-----------+----------+-------------------+ FV Mid   None           No       No                   Acute               +---------+---------------+---------+-----------+----------+-------------------+ FV DistalNone           No       No                   Acute               +---------+---------------+---------+-----------+----------+-------------------+  PFV      Full                                                              +---------+---------------+---------+-----------+----------+-------------------+ POP      Full           Yes      Yes                                      +---------+---------------+---------+-----------+----------+-------------------+ PTV      Full                                                             +---------+---------------+---------+-----------+----------+-------------------+ PERO                                                  Not well visualized +---------+---------------+---------+-----------+----------+-------------------+   +---------+---------------+---------+-----------+----------+--------------+ LEFT     CompressibilityPhasicitySpontaneityPropertiesThrombus Aging +---------+---------------+---------+-----------+----------+--------------+ CFV      Full           Yes      Yes                                 +---------+---------------+---------+-----------+----------+--------------+ SFJ      Full                                                        +---------+---------------+---------+-----------+----------+--------------+ FV Prox  Full                                                        +---------+---------------+---------+-----------+----------+--------------+ FV Mid   Full                                                        +---------+---------------+---------+-----------+----------+--------------+ FV DistalFull                                                        +---------+---------------+---------+-----------+----------+--------------+ PFV      Full                                                        +---------+---------------+---------+-----------+----------+--------------+  POP      Full           Yes      Yes                                 +---------+---------------+---------+-----------+----------+--------------+ PTV      Full                                                         +---------+---------------+---------+-----------+----------+--------------+ PERO     Full                                                        +---------+---------------+---------+-----------+----------+--------------+     Summary: RIGHT: - Findings consistent with acute deep vein thrombosis involving the right femoral vein. - No cystic structure found in the popliteal fossa.  LEFT: - There is no evidence of deep vein thrombosis in the lower extremity. However, portions of this examination were limited- see technologist comments above.  - No cystic structure found in the popliteal fossa.  *See table(s) above for measurements and observations.    Preliminary    [place] EKG SITE RITE  Result Date: 04/12/2020 If Site Rite image not attached, placement could not be confirmed due to current cardiac rhythm.  US Abdomen Limited RUQ (LIVER/GB)  Result Date: 04/11/2020 CLINICAL DATA:  Pain with nausea and hemoptysis EXAM: ULTRASOUND ABDOMEN LIMITED RIGHT UPPER QUADRANT COMPARISON:  CT abdomen and pelvis April 08, 2020 FINDINGS: Gallbladder: There is sludge in the gallbladder. No gallstones are evident. Gallbladder wall is thickened. There is no appreciable pericholecystic fluid. No sonographic Murphy sign noted by sonographer. Common bile duct: Diameter: 4 mm. No intrahepatic or extrahepatic biliary duct dilatation evident. Liver: No focal lesion identified. Within normal limits in parenchymal echogenicity. Portal vein is patent on color Doppler imaging with normal direction of blood flow towards the liver. Other: There is a cyst in the upper pole the right kidney measuring 1.9 x 1.7 x 1.5 cm. IMPRESSION: 1. No gallstones evident. There is sludge in the gallbladder with gallbladder wall thickening. Question a degree of acalculus cholecystitis. This finding may warrant nuclear medicine hepatobiliary imaging study to assess for cystic duct patency. 2. Cyst arising from upper pole right kidney  measuring 1.9 x 1.7 x 1.5 cm. 3.  Study otherwise unremarkable. Electronically Signed   By: Lowella Grip III M.D.   On: 04/11/2020 14:13        Scheduled Meds: . allopurinol  300 mg Oral Daily  . buPROPion  100 mg Oral BID  . Chlorhexidine Gluconate Cloth  6 each Topical Daily  . metoCLOPramide (REGLAN) injection  5 mg Intravenous Q6H  . morphine      . pantoprazole (PROTONIX) IV  40 mg Intravenous Q12H  . sodium chloride flush  10-40 mL Intracatheter Q12H  . sucralfate  1 g Oral TID WC & HS  . thiamine injection  100 mg Intravenous Daily   Continuous Infusions: . heparin 2,300 Units/hr (04/13/20 0431)  . piperacillin-tazobactam (ZOSYN)  IV 3.375 g (04/13/20 0313)  . promethazine (PHENERGAN) injection  LOS: 1 day    Time spent: 35 minutes    Roberta Angell A Rilei Kravitz, MD [facility]   If 7PM-7AM, please contact night-coverage [email or web address]  04/13/2020, 8:26 AM

## 2020-04-14 ENCOUNTER — Telehealth: Payer: Self-pay | Admitting: *Deleted

## 2020-04-14 DIAGNOSIS — I82411 Acute embolism and thrombosis of right femoral vein: Secondary | ICD-10-CM | POA: Diagnosis not present

## 2020-04-14 DIAGNOSIS — K810 Acute cholecystitis: Secondary | ICD-10-CM | POA: Diagnosis not present

## 2020-04-14 DIAGNOSIS — T4145XD Adverse effect of unspecified anesthetic, subsequent encounter: Secondary | ICD-10-CM | POA: Diagnosis not present

## 2020-04-14 DIAGNOSIS — I5082 Biventricular heart failure: Secondary | ICD-10-CM | POA: Diagnosis not present

## 2020-04-14 DIAGNOSIS — I5021 Acute systolic (congestive) heart failure: Secondary | ICD-10-CM | POA: Diagnosis not present

## 2020-04-14 DIAGNOSIS — R000 Tachycardia, unspecified: Secondary | ICD-10-CM | POA: Diagnosis not present

## 2020-04-14 DIAGNOSIS — R112 Nausea with vomiting, unspecified: Secondary | ICD-10-CM | POA: Diagnosis not present

## 2020-04-14 LAB — BETA-2-GLYCOPROTEIN I ABS, IGG/M/A
Beta-2 Glyco I IgG: <9 GPI IgG units (ref 0–20)
Beta-2-Glycoprotein I IgA: <9 GPI IgA units (ref 0–25)
Beta-2-Glycoprotein I IgM: <9 GPI IgM units (ref 0–32)

## 2020-04-14 LAB — CARDIOLIPIN ANTIBODIES, IGG, IGM, IGA
Anticardiolipin IgA: <9 APL U/mL (ref 0–11)
Anticardiolipin IgG: <9 GPL U/mL (ref 0–14)
Anticardiolipin IgM: <9 MPL U/mL (ref 0–12)

## 2020-04-14 LAB — RENAL FUNCTION PANEL
Albumin: 2.6 g/dL — ABNORMAL LOW (ref 3.5–5.0)
Anion gap: 11 (ref 5–15)
BUN: 24 mg/dL — ABNORMAL HIGH (ref 6–20)
CO2: 27 mmol/L (ref 22–32)
Calcium: 9.1 mg/dL (ref 8.9–10.3)
Chloride: 101 mmol/L (ref 98–111)
Creatinine, Ser: 1.31 mg/dL — ABNORMAL HIGH (ref 0.61–1.24)
GFR, Estimated: >60 mL/min (ref >60)
Glucose, Bld: 114 mg/dL — ABNORMAL HIGH (ref 70–99)
Phosphorus: 4.9 mg/dL — ABNORMAL HIGH (ref 2.5–4.6)
Potassium: 4.3 mmol/L (ref 3.5–5.1)
Sodium: 139 mmol/L (ref 135–145)

## 2020-04-14 LAB — BASIC METABOLIC PANEL
Anion gap: 11 (ref 5–15)
BUN: 24 mg/dL — ABNORMAL HIGH (ref 6–20)
CO2: 33 mmol/L — ABNORMAL HIGH (ref 22–32)
Calcium: 8.8 mg/dL — ABNORMAL LOW (ref 8.9–10.3)
Chloride: 94 mmol/L — ABNORMAL LOW (ref 98–111)
Creatinine, Ser: 1.28 mg/dL — ABNORMAL HIGH (ref 0.61–1.24)
GFR, Estimated: >60 mL/min (ref >60)
Glucose, Bld: 140 mg/dL — ABNORMAL HIGH (ref 70–99)
Potassium: 3.5 mmol/L (ref 3.5–5.1)
Sodium: 138 mmol/L (ref 135–145)

## 2020-04-14 LAB — HEPATIC FUNCTION PANEL
ALT: 132 U/L — ABNORMAL HIGH (ref 0–44)
AST: 62 U/L — ABNORMAL HIGH (ref 15–41)
Albumin: 2.6 g/dL — ABNORMAL LOW (ref 3.5–5.0)
Alkaline Phosphatase: 61 U/L (ref 38–126)
Bilirubin, Direct: 2 mg/dL — ABNORMAL HIGH (ref 0.0–0.2)
Indirect Bilirubin: 2.6 mg/dL — ABNORMAL HIGH (ref 0.3–0.9)
Total Bilirubin: 4.6 mg/dL — ABNORMAL HIGH (ref 0.3–1.2)
Total Protein: 6.4 g/dL — ABNORMAL LOW (ref 6.5–8.1)

## 2020-04-14 LAB — CBC
HCT: 48.9 % (ref 39.0–52.0)
Hemoglobin: 15.6 g/dL (ref 13.0–17.0)
MCH: 25.2 pg — ABNORMAL LOW (ref 26.0–34.0)
MCHC: 31.9 g/dL (ref 30.0–36.0)
MCV: 79.1 fL — ABNORMAL LOW (ref 80.0–100.0)
Platelets: 149 10*3/uL — ABNORMAL LOW (ref 150–400)
RBC: 6.18 MIL/uL — ABNORMAL HIGH (ref 4.22–5.81)
RDW: 18.1 % — ABNORMAL HIGH (ref 11.5–15.5)
WBC: 7.3 10*3/uL (ref 4.0–10.5)
nRBC: 0.3 % — ABNORMAL HIGH (ref 0.0–0.2)

## 2020-04-14 LAB — COOXEMETRY PANEL
Carboxyhemoglobin: 1.1 % (ref 0.5–1.5)
Carboxyhemoglobin: 1.1 % (ref 0.5–1.5)
Methemoglobin: 0.8 % (ref 0.0–1.5)
Methemoglobin: 0.7 % (ref 0.0–1.5)
O2 Saturation: 63.8 %
O2 Saturation: 55.9 %
Total hemoglobin: 15.9 g/dL (ref 12.0–16.0)
Total hemoglobin: 16 g/dL (ref 12.0–16.0)

## 2020-04-14 LAB — HEPATITIS PANEL, ACUTE
HCV Ab: NONREACTIVE
Hep A IgM: NONREACTIVE
Hep B C IgM: NONREACTIVE
Hepatitis B Surface Ag: NONREACTIVE

## 2020-04-14 LAB — HEPARIN LEVEL (UNFRACTIONATED)
Heparin Unfractionated: 0.6 IU/mL (ref 0.30–0.70)
Heparin Unfractionated: 0.56 IU/mL (ref 0.30–0.70)

## 2020-04-14 LAB — LIPASE, BLOOD: Lipase: 142 U/L — ABNORMAL HIGH (ref 11–51)

## 2020-04-14 MED ORDER — SPIRONOLACTONE 12.5 MG HALF TABLET
12.5000 mg | ORAL_TABLET | Freq: Every day | ORAL | Status: DC
  Administered 2020-04-15 – 2020-04-24 (×9): 12.5 mg via ORAL
  Filled 2020-04-14 (×9): qty 1

## 2020-04-14 MED ORDER — SALINE SPRAY 0.65 % NA SOLN
1.0000 | NASAL | Status: DC | PRN
  Filled 2020-04-14: qty 44

## 2020-04-14 MED ORDER — POTASSIUM CHLORIDE CRYS ER 20 MEQ PO TBCR
40.0000 meq | EXTENDED_RELEASE_TABLET | Freq: Once | ORAL | Status: AC
  Administered 2020-04-14: 40 meq via ORAL
  Filled 2020-04-14: qty 2

## 2020-04-14 NOTE — Telephone Encounter (Signed)
  Follow up Call-  Call back number 04/09/2020 06/03/2019  Post procedure Call Back phone  # [phone]  Permission to leave phone message Yes Yes  Some recent data might be hidden     Pt still admitted to [facility].  LM on VM for him to call us back if he needs anything regarding his procedure.

## 2020-04-14 NOTE — Progress Notes (Signed)
PROGRESS NOTE    Kevin Baxter  WCB:[id number] DOB: 04/26/1970 DOA: 04/11/2020 PCP: Colon Branch, MD    Chief Complaint  Patient presents with  . Nausea    Brief Narrative:  50 year old with past medical history significant for hypertension, gout, depression, morbid obesity, GERD, history of polyposis of his stomach and colonic polyps, report of esophageal dysphagia who has had abdominal pain, nausea and vomiting for several weeks.  He had a recent admission from 1/27 until 1/30 for similar complaints and treated for acute pancreatitis with lipase at 82, CT scan abdomen suggestive of acute pancreatitis.  Patient underwent further work-up for the same symptoms, underwent endoscopy on 04/09/2020 which did not reveal the cause of his dysphagia, after the endoscopy he had an episode of altered mental status and strokelike symptoms.  At that time in the ED he had an MRI that was negative for stroke and he was discharged home.  Patient continued to have persistent nausea and vomiting, and patient was advised to present to the ED for further evaluation.  In the ED he was found to be tachycardic, elevation of AST and ALT, elevated lipase, positive troponin.  Ultrasound abdomen showed gallbladder sludge.  Patient was admitted for further evaluation.  She was found to have new onset systolic cardiomyopathy, ejection fraction less than 20%, large apical thrombus, new right acute DVT, chronic cholecystitis and pancreatitis.  He was transferred to [facility] on for further evaluation by cardiology.  Patient seen by general surgery started on IV antibiotics for cholecystitis as due to current cardiac issues high risk for surgery at this time. -Heart failure team also consulted patient placed on amiodarone drip as well as Lasix drip.  Cardiac MRI ordered.  Patient for cardiac catheterization later on this week.    Assessment & Plan:   Principal Problem:   Intractable nausea and vomiting Active  Problems:   Essential hypertension   Morbid obesity (HCC)   Pancreatitis, acute   Weight loss, unintentional   Abnormal LFTs   Localized swelling of right lower extremity   Multiple gastric polyps   Poor responsiveness after MAC anesthesia 04/09/2020   Hematemesis with nausea   Mural thrombus of left ventricle   Left ventricular ejection fraction less than 20%   Acute acalculous cholecystitis   Mural thrombus of cardiac apex   Acute deep vein thrombosis (DVT) of right femoral vein (HCC)  1 acute cholecystitis Patient had presented with ongoing nausea and vomiting which was in tractable, dehydration and noted to have a lactic acidosis in the setting of dehydration. -Right upper quadrant ultrasound done consistent with gallbladder sludge. -HIDA scan done consistent with acute cholecystitis. -General surgery consulted and due to patient's cardiac issues recommended IV antibiotics at this time and if no significant improvement may need to go to a percutaneous cholecystostomy tube placement. -Improvement with nausea and vomiting. -Patient stated unable to tolerate Reglan and as such we will discontinue. -General surgery following and recommending clears. -Continue IV Zosyn. -Per general surgery.  2.  Acute systolic biventricular CHF exacerbation -Patient noted to have increased O2 requirements yesterday. -Patient seen in consultation by cardiology. -2D echo done with large left ventricular thrombus, EF < 20%, RV dysfunction with dilated LV, wall motion abnormalities. -TSH within normal limits. -COVID-19 PCR negative. -Patient seen in consultation by heart failure team and patient started on milrinone drip and Lasix drip. -Patient with some clinical improvement. -Urine output of 4.650 L over the past 24 hours. -Patient is -3.62  L during this hospitalization. -Current weight of 135.3 kg today. -Continue digoxin. -Patient for cardiac MRI today. -Patient for cardiac catheterization  later on this week. -Per heart failure team/cardiology.  3.  LV thrombus/right lower extremity DVT -Continue IV heparin -Likely transition to oral anticoagulation once no further procedures anticipated.  4.  Polycythemia Questionable etiology. -Labs for Jak mutation in April pending.  5.  Dysphagia/weight loss -Being followed in the outpatient setting by Dr. Carlean Purl.  Outpatient follow-up.  6.  Transaminitis/hyperbilirubinemia Likely secondary to acute cholecystitis versus CHF. -Acute hepatitis panel negative. -Repeat labs in the morning.  7.  History of gout Stable.  Continue allopurinol.  8.  Episode of altered mental status -After endoscopy 04/09/2020 likely related to anesthesia side effect.  MRI done at that time was negative. -Mental status improved and likely close to baseline.  9.  Intractable nausea and vomiting Likely secondary to problems #1 and 2. -Improved. -DC Reglan.    DVT prophylaxis: Heparin Code Status: Full Family Communication: Updated patient, sister, wife at bedside. Disposition:   Status is: Inpatient    Dispo: The patient is from: Home              Anticipated d/c is to: To be determined              Patient currently on Lasix drip, IV milrinone, IV antibiotics, probable cardiac catheterization later on this week.  Not stable for discharge.   Difficult to place patient no       Consultants:   Advanced heart failure team: Dr. Haroldine Laws 04/13/2020  Cardiology: Dr. Radford Pax 04/12/2020  Gastroenterology: Dr. Tarri Glenn 04/12/2020  General surgery: Dr. Johney Maine 04/13/2020  Procedures:   2D echo 04/12/2020  Lower extremity Dopplers 04/12/2020  Right upper quadrant ultrasound 04/11/2020  HIDA scan 04/12/2020  CT head 04/12/2020  Chest x-ray 04/12/2020  Cardiac MRI pending  Antimicrobials:   IV Zosyn 04/12/2020>>>>   Subjective: Still with shortness of breath.  Denies chest pain.  No abdominal pain.  Noted to wife that patient unable to  tolerate Reglan leading to nausea and vomiting.  Denies any further nausea and vomiting over the past 24 hours.  States urinating a lot.  Objective: Vitals:   04/14/20 0012 04/14/20 0600 04/14/20 0622 04/14/20 1107  BP: (!) 131/101  (!) 127/102 (!) 126/96  Pulse: (!) 126  (!) 123 (!) 123  Resp: (!) 21  20 (!) 22  Temp: 98.6 F (37 C)  98 F (36.7 C) 97.8 F (36.6 C)  TempSrc: Oral  Oral Oral  SpO2: 92% 92% 95% 92%  Weight:   135.3 kg   Height:        Intake/Output Summary (Last 24 hours) at 04/14/2020 1330 Last data filed at 04/14/2020 1325 Gross per 24 hour  Intake 984.75 ml  Output 8350 ml  Net -7365.25 ml   Filed Weights   04/11/20 1123 04/14/20 0622  Weight: 135.2 kg 135.3 kg    Examination:  General exam: Appears calm and comfortable  Respiratory system: Diffuse crackles.  No wheezing.  No rhonchi.  Speaking in full sentences. Cardiovascular system: Tachycardia. + JVD, murmurs, rubs, gallops or clicks.  2+ right lower extremity edema, trace to 1+ left lower extremity edema.  Gastrointestinal system: Abdomen is nondistended, soft and nontender. No organomegaly or masses felt. Normal bowel sounds heard. Central nervous system: Alert and oriented. No focal neurological deficits. Extremities: Symmetric 5 x 5 power. Skin: No rashes, lesions or ulcers Psychiatry: Judgement and insight appear normal.  Mood & affect appropriate.     Data Reviewed: I have personally reviewed following labs and imaging studies  CBC: Recent Labs  Lab 04/09/20 1301 04/11/20 1140 04/11/20 1841 04/12/20 0027 04/12/20 0752 04/12/20 2046 04/13/20 1040 04/14/20 0713  WBC 6.9 6.9  --   --   --   --  7.3 7.3  NEUTROABS 5.6 4.9  --   --   --   --   --   --   HGB 16.0 16.2   < > 16.0 15.8 16.3 15.5 15.6  HCT 50.8 50.2   < > 51.4 50.4 50.8 48.7 48.9  MCV 80.5 79.4*  --   --   --   --  79.4* 79.1*  PLT 175 146*  --   --   --   --  108* 149*   < > = values in this interval not displayed.     Basic Metabolic Panel: Recent Labs  Lab 04/09/20 1301 04/11/20 1140 04/12/20 0027 04/13/20 0041 04/14/20 0712  NA 138 135 136 136 139  K 4.7 4.5 4.6 4.9 4.3  CL 105 102 105 102 101  CO2 23 21* 20* 26 27  GLUCOSE 119* 125* 120* 126* 114*  BUN 18 24* 23* 20 24*  CREATININE 1.14 1.12 0.91 1.26* 1.31*  CALCIUM 8.3* 8.6* 8.5* 8.6* 9.1  MG 2.0 2.0  --   --   --   PHOS  --   --   --   --  4.9*    GFR: Estimated Creatinine Clearance: 96.3 mL/min (A) (by C-G formula based on SCr of 1.31 mg/dL (H)).  Liver Function Tests: Recent Labs  Lab 04/09/20 1301 04/11/20 1140 04/12/20 0027 04/13/20 0041 04/14/20 0712 04/14/20 0713  AST 40 120* 144* 104*  --  62*  ALT 69* 134* 172* 169*  --  132*  ALKPHOS 61 64 59 59  --  61  BILITOT 3.3* 3.9* 4.0* 4.5*  --  4.6*  PROT 6.3* 6.8 6.6 6.1*  --  6.4*  ALBUMIN 2.7* 2.9* 2.9* 2.5* 2.6* 2.6*    CBG: No results for input(s): GLUCAP in the last 168 hours.   Recent Results (from the past 240 hour(s))  Resp Panel by RT-PCR (Flu A&B, Covid) Nasopharyngeal Swab     Status: None   Collection Time: 04/11/20 11:40 AM   Specimen: Nasopharyngeal Swab; Nasopharyngeal(NP) swabs in vial transport medium  Result Value Ref Range Status   SARS Coronavirus 2 by RT PCR NEGATIVE NEGATIVE Final    Comment: (NOTE) SARS-CoV-2 target nucleic acids are NOT DETECTED.  The SARS-CoV-2 RNA is generally detectable in upper respiratory specimens during the acute phase of infection. The lowest concentration of SARS-CoV-2 viral copies this assay can detect is 138 copies/mL. A negative result does not preclude SARS-Cov-2 infection and should not be used as the sole basis for treatment or other patient management decisions. A negative result may occur with  improper specimen collection/handling, submission of specimen other than nasopharyngeal swab, presence of viral mutation(s) within the areas targeted by this assay, and inadequate number of viral copies(<138  copies/mL). A negative result must be combined with clinical observations, patient history, and epidemiological information. The expected result is Negative.  Fact Sheet for Patients:  EntrepreneurPulse.com.au  Fact Sheet for Healthcare Providers:  IncredibleEmployment.be  This test is no t yet approved or cleared by the [place] FDA and  has been authorized for detection and/or diagnosis of SARS-CoV-2 by FDA under an Emergency Use  Authorization (EUA). This EUA will remain  in effect (meaning this test can be used) for the duration of the COVID-19 declaration under Section 564(b)(1) of the Act, 21 U.S.C.section 360bbb-3(b)(1), unless the authorization is terminated  or revoked sooner.       Influenza A by PCR NEGATIVE NEGATIVE Final   Influenza B by PCR NEGATIVE NEGATIVE Final    Comment: (NOTE) The Xpert Xpress SARS-CoV-2/FLU/RSV plus assay is intended as an aid in the diagnosis of influenza from Nasopharyngeal swab specimens and should not be used as a sole basis for treatment. Nasal washings and aspirates are unacceptable for Xpert Xpress SARS-CoV-2/FLU/RSV testing.  Fact Sheet for Patients: EntrepreneurPulse.com.au  Fact Sheet for Healthcare Providers: IncredibleEmployment.be  This test is not yet approved or cleared by the [place] FDA and has been authorized for detection and/or diagnosis of SARS-CoV-2 by FDA under an Emergency Use Authorization (EUA). This EUA will remain in effect (meaning this test can be used) for the duration of the COVID-19 declaration under Section 564(b)(1) of the Act, 21 U.S.C. section 360bbb-3(b)(1), unless the authorization is terminated or revoked.  Performed at [facility], [address] [place], [place] [address]          Radiology Studies: CT HEAD WO CONTRAST  Result Date: 04/12/2020 CLINICAL DATA:  Altered mental status. EXAM: CT  HEAD WITHOUT CONTRAST TECHNIQUE: Contiguous axial images were obtained from the base of the skull through the vertex without intravenous contrast. COMPARISON:  MR head, dated April 09, 2020 FINDINGS: Brain: No evidence of acute infarction, hemorrhage, hydrocephalus, extra-axial collection or mass lesion/mass effect. Vascular: No hyperdense vessel or unexpected calcification. Skull: Normal. Negative for fracture or focal lesion. Sinuses/Orbits: No acute finding. Other: None. IMPRESSION: No acute intracranial pathology. Electronically Signed   By: Virgina Norfolk M.D.   On: 04/12/2020 17:23   DG Chest Port 1 View  Result Date: 04/12/2020 CLINICAL DATA:  Evaluate for congestive heart failure. EXAM: PORTABLE CHEST 1 VIEW COMPARISON:  April 01, 2020 FINDINGS: The cardiac silhouette is markedly enlarged and unchanged in size. Both lungs are clear. The visualized skeletal structures are unremarkable. IMPRESSION: 1. Cardiomegaly without acute or active cardiopulmonary disease. Electronically Signed   By: Virgina Norfolk M.D.   On: 04/12/2020 15:33   ECHOCARDIOGRAM COMPLETE  Result Date: 04/12/2020    ECHOCARDIOGRAM REPORT   Patient Name:   Kevin Baxter Date of Exam: 04/12/2020 Medical Rec #:  [id number]          Height:       73.0 in Accession #:    [id number]         Weight:       298.0 lb Date of Birth:  1971/01/14         BSA:          2.549 m Patient Age:    55 years           BP:           122/99 mmHg Patient Gender: M                  HR:           115 bpm. Exam Location:  Inpatient Procedure: 2D Echo, Cardiac Doppler, Color Doppler and Intracardiac            Opacification Agent                       STAT  ECHO Reported to: Dr Mertie Moores on 04/12/2020 2:13:00 PM. Indications:    Elevated Troponin  History:        Patient has no prior history of Echocardiogram examinations.                 Risk Factors:Hypertension and GERD.  Sonographer:    Jonelle Sidle Dance Referring Phys: [id number] [place] IMPRESSIONS   1. There is a large apical thromus measuring 3.6 x 2.3 cm .     Marland Kitchen Left ventricular ejection fraction, by estimation, is <20%. The left ventricle has severely decreased function. The left ventricle demonstrates regional wall motion abnormalities (see scoring diagram/findings for description). The left ventricular internal cavity size was moderately to severely dilated. Left ventricular diastolic function could not be evaluated. There is severe akinesis of the left ventricular, mid-apical apical segment, anteroseptal wall, inferoseptal wall and lateral wall.  2. Right ventricular systolic function was not well visualized. The right ventricular size is not well visualized.  3. The mitral valve is grossly normal. No evidence of mitral valve regurgitation.  4. The aortic valve is normal in structure. Aortic valve regurgitation is not visualized. No aortic stenosis is present.  5. Aortic dilatation noted. There is borderline dilatation of the ascending aorta, measuring 39 mm. FINDINGS  Left Ventricle: There is a large apical thromus measuring 3.6 x 2.3 cm. Left ventricular ejection fraction, by estimation, is <20%. The left ventricle has severely decreased function. The left ventricle demonstrates regional wall motion abnormalities. Severe akinesis of the left ventricular, mid-apical apical segment, anteroseptal wall, inferoseptal wall and lateral wall. Definity contrast agent was given IV to delineate the left ventricular endocardial borders. The left ventricular internal cavity size was moderately to severely dilated. There is no left ventricular hypertrophy. Left ventricular diastolic function could not be evaluated. Right Ventricle: The right ventricular size is not well visualized. Right vetricular wall thickness was not well visualized. Right ventricular systolic function was not well visualized. Left Atrium: Left atrial size was not well visualized. Right Atrium: Right atrial size was not well visualized.  Pericardium: There is no evidence of pericardial effusion. Mitral Valve: The mitral valve is grossly normal. No evidence of mitral valve regurgitation. Tricuspid Valve: The tricuspid valve is normal in structure. Tricuspid valve regurgitation is mild. Aortic Valve: The aortic valve is normal in structure. Aortic valve regurgitation is not visualized. No aortic stenosis is present. Pulmonic Valve: The pulmonic valve was normal in structure. Pulmonic valve regurgitation is not visualized. Aorta: Aortic dilatation noted. There is borderline dilatation of the ascending aorta, measuring 39 mm. IAS/Shunts: The atrial septum is grossly normal.  LEFT VENTRICLE PLAX 2D LVIDd:         6.23 cm  Diastology LVIDs:         5.59 cm  LV e' medial:    5.84 cm/s LV PW:         1.38 cm  LV E/e' medial:  14.0 LV IVS:        0.94 cm  LV e' lateral:   6.48 cm/s LVOT diam:     2.30 cm  LV E/e' lateral: 12.6 LV SV:         23 LV SV Index:   9 LVOT Area:     4.15 cm  RIGHT VENTRICLE             IVC RV Basal diam:  3.49 cm     IVC diam: 2.50 cm RV Mid diam:    2.29 cm  RV S prime:     10.30 cm/s TAPSE (M-mode): 1.8 cm LEFT ATRIUM         Index LA diam:    4.60 cm 1.80 cm/m  AORTIC VALVE LVOT Vmax:   46.50 cm/s LVOT Vmean:  30.100 cm/s LVOT VTI:    0.055 m  AORTA Ao Root diam: 3.40 cm Ao Asc diam:  3.90 cm MITRAL VALVE MV Area (PHT): 4.70 cm    SHUNTS MV Decel Time: 162 msec    Systemic VTI:  0.05 m MV E velocity: 81.70 cm/s  Systemic Diam: 2.30 cm MV A velocity: 30.60 cm/s MV E/A ratio:  2.67 Mertie Moores MD Electronically signed by Mertie Moores MD Signature Date/Time: 04/12/2020/2:24:59 PM    Final    VAS [place] LOWER EXTREMITY VENOUS (DVT)  Result Date: 04/13/2020  Lower Venous DVT Study Indications: Swelling.  Risk Factors: None identified. Limitations: Body habitus and poor ultrasound/tissue interface. Comparison Study: No prior studies. Performing Technologist: Oliver Hum RVT  Examination Guidelines: A complete evaluation  includes B-mode imaging, spectral Doppler, color Doppler, and power Doppler as needed of all accessible portions of each vessel. Bilateral testing is considered an integral part of a complete examination. Limited examinations for reoccurring indications may be performed as noted. The reflux portion of the exam is performed with the patient in reverse Trendelenburg.  +---------+---------------+---------+-----------+----------+-------------------+ RIGHT    CompressibilityPhasicitySpontaneityPropertiesThrombus Aging      +---------+---------------+---------+-----------+----------+-------------------+ CFV      Full           No       No                                       +---------+---------------+---------+-----------+----------+-------------------+ SFJ      Full                                                             +---------+---------------+---------+-----------+----------+-------------------+ FV Prox  Full                                                             +---------+---------------+---------+-----------+----------+-------------------+ FV Mid   None           No       No                   Acute               +---------+---------------+---------+-----------+----------+-------------------+ FV DistalNone           No       No                   Acute               +---------+---------------+---------+-----------+----------+-------------------+ PFV      Full                                                             +---------+---------------+---------+-----------+----------+-------------------+  POP      Full           Yes      Yes                                      +---------+---------------+---------+-----------+----------+-------------------+ PTV      Full                                                             +---------+---------------+---------+-----------+----------+-------------------+ PERO                                                   Not well visualized +---------+---------------+---------+-----------+----------+-------------------+   +---------+---------------+---------+-----------+----------+--------------+ LEFT     CompressibilityPhasicitySpontaneityPropertiesThrombus Aging +---------+---------------+---------+-----------+----------+--------------+ CFV      Full           Yes      Yes                                 +---------+---------------+---------+-----------+----------+--------------+ SFJ      Full                                                        +---------+---------------+---------+-----------+----------+--------------+ FV Prox  Full                                                        +---------+---------------+---------+-----------+----------+--------------+ FV Mid   Full                                                        +---------+---------------+---------+-----------+----------+--------------+ FV DistalFull                                                        +---------+---------------+---------+-----------+----------+--------------+ PFV      Full                                                        +---------+---------------+---------+-----------+----------+--------------+ POP      Full           Yes      Yes                                 +---------+---------------+---------+-----------+----------+--------------+  PTV      Full                                                        +---------+---------------+---------+-----------+----------+--------------+ PERO     Full                                                        +---------+---------------+---------+-----------+----------+--------------+     Summary: RIGHT: - Findings consistent with acute deep vein thrombosis involving the right femoral vein. - No cystic structure found in the popliteal fossa.  LEFT: - There is no evidence of deep vein thrombosis in the lower  extremity. However, portions of this examination were limited- see technologist comments above.  - No cystic structure found in the popliteal fossa.  *See table(s) above for measurements and observations. Electronically signed by Deitra Mayo MD on 04/13/2020 at 12:36:42 PM.    Final    [place] EKG SITE RITE  Result Date: 04/12/2020 If Site Rite image not attached, placement could not be confirmed due to current cardiac rhythm.       Scheduled Meds: . allopurinol  300 mg Oral Daily  . buPROPion  100 mg Oral BID  . Chlorhexidine Gluconate Cloth  6 each Topical Daily  . digoxin  0.125 mg Oral Daily  . metoCLOPramide (REGLAN) injection  5 mg Intravenous Q6H  . morphine      . pantoprazole (PROTONIX) IV  40 mg Intravenous Q12H  . sodium chloride flush  10-40 mL Intracatheter Q12H  . sucralfate  1 g Oral TID WC & HS  . thiamine injection  100 mg Intravenous Daily   Continuous Infusions: . furosemide (LASIX) 200 mg in dextrose 5% 100 mL (45m/mL) infusion 20 mg/hr (04/14/20 01031  . heparin 2,300 Units/hr (04/14/20 0346)  . milrinone 0.125 mcg/kg/min (04/14/20 1050)  . piperacillin-tazobactam (ZOSYN)  IV 3.375 g (04/14/20 1042)  . promethazine (PHENERGAN) injection       LOS: 2 days    Time spent: 40 minutes    DIrine Seal MD [facility]   To contact the attending provider between 7A-7P or the covering provider during after hours 7P-7A, please log into the web site [email or web address] and access using universal [place] password for that web site. If you do not have the password, please call the hospital operator.  04/14/2020, 1:30 PM

## 2020-04-14 NOTE — Plan of Care (Signed)
  Problem: Education: Goal: Ability to demonstrate management of disease process will improve Outcome: Progressing Goal: Ability to verbalize understanding of medication therapies will improve Outcome: Progressing   Problem: Activity: Goal: Capacity to carry out activities will improve Outcome: Progressing   Problem: Cardiac: Goal: Ability to achieve and maintain adequate cardiopulmonary perfusion will improve Outcome: Progressing   

## 2020-04-14 NOTE — Progress Notes (Addendum)
[facility] for Heparin Indication: DVT and apical thrombus  Allergies  Allergen Reactions  . Pseudoephedrine     REACTION: jittery    Patient Measurements: Height: 6\' 1"  (185.4 cm) Weight: 135.3 kg (298 lb 4.8 oz) IBW/kg (Calculated) : 79.9 Heparin Dosing Weight: 110 kg  Vital Signs: Temp: 97.8 F (36.6 C) (03/16 1107) Temp Source: Oral (03/16 1107) BP: 126/96 (03/16 1107) Pulse Rate: 123 (03/16 1107)  Labs: Recent Labs    04/11/20 1841 04/11/20 2111 04/12/20 0027 04/12/20 0752 04/12/20 1552 04/12/20 2046 04/13/20 0041 04/13/20 1040 04/13/20 1250 04/14/20 0712 04/14/20 0713 04/14/20 0714  HGB 16.2  --  16.0   < >  --  16.3  --  15.5  --   --  15.6  --   HCT 51.2  --  51.4   < >  --  50.8  --  48.7  --   --  48.9  --   PLT  --   --   --   --   --   --   --  108*  --   --  149*  --   APTT  --   --   --   --  35  --   --   --   --   --   --   --   LABPROT  --   --   --   --  21.2*  --   --   --   --   --   --   --   INR  --   --   --   --  1.9*  --   --   --   --   --   --   --   HEPARINUNFRC  --   --   --   --   --   --  0.24*  --  0.37  --   --  0.60  CREATININE  --   --  0.91  --   --   --  1.26*  --   --  1.31*  --   --   TROPONINIHS 48* 38*  --   --   --   --   --   --   --   --   --   --    < > = values in this interval not displayed.    Estimated Creatinine Clearance: 96.3 mL/min (A) (by C-G formula based on SCr of 1.31 mg/dL (H)).  Assessment: 50 y.o. male with RLE DVT and apical thrombus on IV heparin. Heparin level remains therapeutic but up from 0.37 to 0.6 today. Will recheck another HL this afternoon to ensure maintains therapeutic range. CBC stable - Hgb 15s, pltc up 108>149. No overt bleeding or infusion issues noted.  Goal of Therapy:  Heparin level 0.3-0.7 units/ml Monitor platelets by anticoagulation protocol: Yes   Plan:  Continue IV heparin at 2300 units/hr. Recheck 1200 HL Daily HL, CBC, s/sx  bleeding F/u plan for oral anticoagulation eventually. ====================================== PM Addendum: Confirmatory HL 0.56 therapeutic. No overt bleeding or infusion issues noted. Will continue current rate and recheck in AM.  Richardine Service, PharmD, BCPS PGY2 Cardiology Pharmacy Resident Phone: [phone] 04/14/2020  12:10 PM  Please check [email or web address] for unit-specific pharmacy phone numbers.

## 2020-04-14 NOTE — Progress Notes (Addendum)
Subjective: CC: Patient reports no abdominal pain, n/v since yesterday afternoon.   Patient saturations in the 80's when I entered the room. Bumped o2 up to 5L with improved o2 sats. Cardiology in the room to eval.    Objective: Vital signs in last 24 hours: Temp:  [97.8 F (36.6 C)-98.6 F (37 C)] 98 F (36.7 C) (03/16 0622) Pulse Rate:  [118-126] 123 (03/16 0622) Resp:  [20-21] 20 (03/16 0622) BP: (121-133)/(99-102) 127/102 (03/16 0622) SpO2:  [92 %-98 %] 95 % (03/16 0622) Weight:  [135.3 kg] 135.3 kg (03/16 0622) Last BM Date: 04/12/20  Intake/Output from previous day: 03/15 0701 - 03/16 0700 In: 1024.8 [I.V.:888.5; IV Piggyback:136.2] Out: 4650 [Urine:4650] Intake/Output this shift: No intake/output data recorded.  PE: Gen:  Alert, NAD, pleasant Heart: Tachycardic  Pulm: Decreased breath sounds at the bases b/l. Clear upper lungs field. On 5L.  Abd: Soft, ND, NT, +BS Ext:  B/l LE pitting edema R>L Psych: A&Ox3  Skin: no rashes noted, warm and dry  Lab Results:  Recent Labs    04/13/20 1040 04/14/20 0713  WBC 7.3 7.3  HGB 15.5 15.6  HCT 48.7 48.9  PLT 108* 149*   BMET Recent Labs    04/13/20 0041 04/14/20 0712  NA 136 139  K 4.9 4.3  CL 102 101  CO2 26 27  GLUCOSE 126* 114*  BUN 20 24*  CREATININE 1.26* 1.31*  CALCIUM 8.6* 9.1   PT/INR Recent Labs    04/12/20 1552  LABPROT 21.2*  INR 1.9*   CMP     Component Value Date/Time   NA 139 04/14/2020 0712   K 4.3 04/14/2020 0712   CL 101 04/14/2020 0712   CO2 27 04/14/2020 0712   GLUCOSE 114 (H) 04/14/2020 0712   GLUCOSE 90 11/22/2005 1016   BUN 24 (H) 04/14/2020 0712   CREATININE 1.31 (H) 04/14/2020 0712   CALCIUM 9.1 04/14/2020 0712   PROT 6.4 (L) 04/14/2020 0713   ALBUMIN 2.6 (L) 04/14/2020 0713   AST 62 (H) 04/14/2020 0713   ALT 132 (H) 04/14/2020 0713   ALKPHOS 61 04/14/2020 0713   BILITOT 4.6 (H) 04/14/2020 0713   GFRNONAA >60 04/14/2020 0712   GFRAA 108 10/03/2007 1056    Lipase     Component Value Date/Time   LIPASE 142 (H) 04/14/2020 0713       Studies/Results: CT HEAD WO CONTRAST  Result Date: 04/12/2020 CLINICAL DATA:  Altered mental status. EXAM: CT HEAD WITHOUT CONTRAST TECHNIQUE: Contiguous axial images were obtained from the base of the skull through the vertex without intravenous contrast. COMPARISON:  MR head, dated April 09, 2020 FINDINGS: Brain: No evidence of acute infarction, hemorrhage, hydrocephalus, extra-axial collection or mass lesion/mass effect. Vascular: No hyperdense vessel or unexpected calcification. Skull: Normal. Negative for fracture or focal lesion. Sinuses/Orbits: No acute finding. Other: None. IMPRESSION: No acute intracranial pathology. Electronically Signed   By: Virgina Norfolk M.D.   On: 04/12/2020 17:23   NM Hepatobiliary Liver Func  Result Date: 04/12/2020 CLINICAL DATA:  Evaluate for cholecystitis. Nausea and vomiting. Right upper quadrant pain. EXAM: NUCLEAR MEDICINE HEPATOBILIARY IMAGING TECHNIQUE: Sequential images of the abdomen were obtained out to 60 minutes following intravenous administration of radiopharmaceutical. RADIOPHARMACEUTICALS:  7.5 mCi Tc-84m  Choletec IV COMPARISON:  Abdominal sonogram 04/11/2020 FINDINGS: Prompt uptake and biliary excretion of activity by the liver is seen. Biliary activity passes into small bowel, consistent with patent common bile duct. After 60 minutes no  gallbladder activity was visualized. The patient subsequently received 3 mg of morphine, IV. Imaging was performed for an additional 30 minutes without evidence for gallbladder activity. IMPRESSION: 1. Nonvisualization of the gallbladder which is compatible with acute cholecystitis. Electronically Signed   By: Kerby Moors M.D.   On: 04/12/2020 13:00   DG Chest Port 1 View  Result Date: 04/12/2020 CLINICAL DATA:  Evaluate for congestive heart failure. EXAM: PORTABLE CHEST 1 VIEW COMPARISON:  April 01, 2020 FINDINGS: The cardiac  silhouette is markedly enlarged and unchanged in size. Both lungs are clear. The visualized skeletal structures are unremarkable. IMPRESSION: 1. Cardiomegaly without acute or active cardiopulmonary disease. Electronically Signed   By: Virgina Norfolk M.D.   On: 04/12/2020 15:33   ECHOCARDIOGRAM COMPLETE  Result Date: 04/12/2020    ECHOCARDIOGRAM REPORT   Patient Name:   Kevin Baxter Date of Exam: 04/12/2020 Medical Rec #:  [id number]          Height:       73.0 in Accession #:    [id number]         Weight:       298.0 lb Date of Birth:  [birth year]         BSA:          2.549 m Patient Age:    11 years           BP:           122/99 mmHg Patient Gender: M                  HR:           115 bpm. Exam Location:  Inpatient Procedure: 2D Echo, Cardiac Doppler, Color Doppler and Intracardiac            Opacification Agent                       STAT ECHO Reported to: Dr Mertie Moores on 04/12/2020 2:13:00 PM. Indications:    Elevated Troponin  History:        Patient has no prior history of Echocardiogram examinations.                 Risk Factors:Hypertension and GERD.  Sonographer:    Jonelle Sidle Dance Referring Phys: [id number] [employer] [place] IMPRESSIONS  1. There is a large apical thromus measuring 3.6 x 2.3 cm .     Marland Kitchen Left ventricular ejection fraction, by estimation, is <20%. The left ventricle has severely decreased function. The left ventricle demonstrates regional wall motion abnormalities (see scoring diagram/findings for description). The left ventricular internal cavity size was moderately to severely dilated. Left ventricular diastolic function could not be evaluated. There is severe akinesis of the left ventricular, mid-apical apical segment, anteroseptal wall, inferoseptal wall and lateral wall.  2. Right ventricular systolic function was not well visualized. The right ventricular size is not well visualized.  3. The mitral valve is grossly normal. No evidence of mitral valve regurgitation.  4. The aortic  valve is normal in structure. Aortic valve regurgitation is not visualized. No aortic stenosis is present.  5. Aortic dilatation noted. There is borderline dilatation of the ascending aorta, measuring 39 mm. FINDINGS  Left Ventricle: There is a large apical thromus measuring 3.6 x 2.3 cm. Left ventricular ejection fraction, by estimation, is <20%. The left ventricle has severely decreased function. The left ventricle demonstrates regional wall motion abnormalities. Severe akinesis of the left ventricular, mid-apical  apical segment, anteroseptal wall, inferoseptal wall and lateral wall. Definity contrast agent was given IV to delineate the left ventricular endocardial borders. The left ventricular internal cavity size was moderately to severely dilated. There is no left ventricular hypertrophy. Left ventricular diastolic function could not be evaluated. Right Ventricle: The right ventricular size is not well visualized. Right vetricular wall thickness was not well visualized. Right ventricular systolic function was not well visualized. Left Atrium: Left atrial size was not well visualized. Right Atrium: Right atrial size was not well visualized. Pericardium: There is no evidence of pericardial effusion. Mitral Valve: The mitral valve is grossly normal. No evidence of mitral valve regurgitation. Tricuspid Valve: The tricuspid valve is normal in structure. Tricuspid valve regurgitation is mild. Aortic Valve: The aortic valve is normal in structure. Aortic valve regurgitation is not visualized. No aortic stenosis is present. Pulmonic Valve: The pulmonic valve was normal in structure. Pulmonic valve regurgitation is not visualized. Aorta: Aortic dilatation noted. There is borderline dilatation of the ascending aorta, measuring 39 mm. IAS/Shunts: The atrial septum is grossly normal.  LEFT VENTRICLE PLAX 2D LVIDd:         6.23 cm  Diastology LVIDs:         5.59 cm  LV e' medial:    5.84 cm/s LV PW:         1.38 cm  LV E/e'  medial:  14.0 LV IVS:        0.94 cm  LV e' lateral:   6.48 cm/s LVOT diam:     2.30 cm  LV E/e' lateral: 12.6 LV SV:         23 LV SV Index:   9 LVOT Area:     4.15 cm  RIGHT VENTRICLE             IVC RV Basal diam:  3.49 cm     IVC diam: 2.50 cm RV Mid diam:    2.29 cm RV S prime:     10.30 cm/s TAPSE (M-mode): 1.8 cm LEFT ATRIUM         Index LA diam:    4.60 cm 1.80 cm/m  AORTIC VALVE LVOT Vmax:   46.50 cm/s LVOT Vmean:  30.100 cm/s LVOT VTI:    0.055 m  AORTA Ao Root diam: 3.40 cm Ao Asc diam:  3.90 cm MITRAL VALVE MV Area (PHT): 4.70 cm    SHUNTS MV Decel Time: 162 msec    Systemic VTI:  0.05 m MV E velocity: 81.70 cm/s  Systemic Diam: 2.30 cm MV A velocity: 30.60 cm/s MV E/A ratio:  2.67 Mertie Moores MD Electronically signed by Mertie Moores MD Signature Date/Time: 04/12/2020/2:24:59 PM    Final    VAS [place] LOWER EXTREMITY VENOUS (DVT)  Result Date: 04/13/2020  Lower Venous DVT Study Indications: Swelling.  Risk Factors: None identified. Limitations: Body habitus and poor ultrasound/tissue interface. Comparison Study: No prior studies. Performing Technologist: Oliver Hum RVT  Examination Guidelines: A complete evaluation includes B-mode imaging, spectral Doppler, color Doppler, and power Doppler as needed of all accessible portions of each vessel. Bilateral testing is considered an integral part of a complete examination. Limited examinations for reoccurring indications may be performed as noted. The reflux portion of the exam is performed with the patient in reverse Trendelenburg.  +---------+---------------+---------+-----------+----------+-------------------+ RIGHT    CompressibilityPhasicitySpontaneityPropertiesThrombus Aging      +---------+---------------+---------+-----------+----------+-------------------+ CFV      Full           No  No                                       +---------+---------------+---------+-----------+----------+-------------------+ SFJ      Full                                                              +---------+---------------+---------+-----------+----------+-------------------+ FV Prox  Full                                                             +---------+---------------+---------+-----------+----------+-------------------+ FV Mid   None           No       No                   Acute               +---------+---------------+---------+-----------+----------+-------------------+ FV DistalNone           No       No                   Acute               +---------+---------------+---------+-----------+----------+-------------------+ PFV      Full                                                             +---------+---------------+---------+-----------+----------+-------------------+ POP      Full           Yes      Yes                                      +---------+---------------+---------+-----------+----------+-------------------+ PTV      Full                                                             +---------+---------------+---------+-----------+----------+-------------------+ PERO                                                  Not well visualized +---------+---------------+---------+-----------+----------+-------------------+   +---------+---------------+---------+-----------+----------+--------------+ LEFT     CompressibilityPhasicitySpontaneityPropertiesThrombus Aging +---------+---------------+---------+-----------+----------+--------------+ CFV      Full           Yes      Yes                                 +---------+---------------+---------+-----------+----------+--------------+  SFJ      Full                                                        +---------+---------------+---------+-----------+----------+--------------+ FV Prox  Full                                                         +---------+---------------+---------+-----------+----------+--------------+ FV Mid   Full                                                        +---------+---------------+---------+-----------+----------+--------------+ FV DistalFull                                                        +---------+---------------+---------+-----------+----------+--------------+ PFV      Full                                                        +---------+---------------+---------+-----------+----------+--------------+ POP      Full           Yes      Yes                                 +---------+---------------+---------+-----------+----------+--------------+ PTV      Full                                                        +---------+---------------+---------+-----------+----------+--------------+ PERO     Full                                                        +---------+---------------+---------+-----------+----------+--------------+     Summary: RIGHT: - Findings consistent with acute deep vein thrombosis involving the right femoral vein. - No cystic structure found in the popliteal fossa.  LEFT: - There is no evidence of deep vein thrombosis in the lower extremity. However, portions of this examination were limited- see technologist comments above.  - No cystic structure found in the popliteal fossa.  *See table(s) above for measurements and observations. Electronically signed by Deitra Mayo MD on 04/13/2020 at 12:36:42 PM.    Final    [place] EKG SITE RITE  Result Date: 04/12/2020 If Site Rite image not attached, placement could not be confirmed due to current  cardiac rhythm.   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 He did not have stones on [place] but did have sludge - Patients abdominal pain, n/v have improved. He is afebrile and wbc normal. LFTs improving. Okay for CLD from our standpoint.  - Given patients new HF with EF < 20%, DVT, LV thrombus cardiology feels his cardiac status very tenuous and high risk forGBsurgery. Continue IV abx. If patient was to worsen, would likely need IR percutaneous cholecystectomy  - Continue to trend labs. We will follow with you  FEN - NPO (okay for cld), IVF per primary  VTE - SCDs, heparin ID - Zosyn 3/14 >>    LOS: 2 days    Jillyn Ledger , [facility] 04/14/2020, 9:42 AM Please see Amion for pager number during day hours 7:00am-4:30pm

## 2020-04-14 NOTE — Progress Notes (Addendum)
Advanced Heart Failure Team Rounding Note   Primary Physician: Kathlene November Primary Cardiologist:  Fransico Him  Reason for Consultation: Biventricular CHF  HPI:   3/15 coox 48%, started on milrinone and lasix gtt  Coox improved to 64%.  CVP down to 11-12 personally performed, brisk diuresis with lasix gtt at 20 on milrinone 0.125.   Interval improvement in breathing but still dyspneic with minimal exertion, abdominal pain has improved.      Objective:    Vital Signs:   Temp:  [97.8 F (36.6 C)-98.6 F (37 C)] 98 F (36.7 C) (03/16 0622) Pulse Rate:  [118-126] 123 (03/16 0622) Resp:  [20-21] 20 (03/16 0622) BP: (121-133)/(99-102) 127/102 (03/16 0622) SpO2:  [92 %-98 %] 95 % (03/16 0622) Weight:  [135.3 kg] 135.3 kg (03/16 0622) Last BM Date: 04/12/20  Weight change: Filed Weights   04/11/20 1123 04/14/20 0622  Weight: 135.2 kg 135.3 kg    Intake/Output:   Intake/Output Summary (Last 24 hours) at 04/14/2020 0925 Last data filed at 04/14/2020 6301 Gross per 24 hour  Intake 1024.75 ml  Output 4650 ml  Net -3625.25 ml      Physical Exam    General:  Obese mail in no distress. No resp difficulty HEENT: normal Neck: supple. JVP difficult to assess . Carotids 2+ bilat; no bruits. No lymphadenopathy or thyromegaly appreciated. Cor: PMI nondisplaced. Tachycardic but regular rhythm. No rubs, gallops or murmurs. Lungs: clear Abdomen: soft, mildly tender to RUQ but no guarding.  Extremities: + for clubbing of fingers, no rash, R LE edema 2+, LLE edema trace Neuro: alert & orientedx3  Telemetry   Sinus tach ~120  EKG    No new ecg  Labs   Basic Metabolic Panel: Recent Labs  Lab 04/09/20 1301 04/11/20 1140 04/12/20 0027 04/13/20 0041 04/14/20 0712  NA 138 135 136 136 139  K 4.7 4.5 4.6 4.9 4.3  CL 105 102 105 102 101  CO2 23 21* 20* 26 27  GLUCOSE 119* 125* 120* 126* 114*  BUN 18 24* 23* 20 24*  CREATININE 1.14 1.12 0.91 1.26* 1.31*  CALCIUM 8.3*  8.6* 8.5* 8.6* 9.1  MG 2.0 2.0  --   --   --   PHOS  --   --   --   --  4.9*    Liver Function Tests: Recent Labs  Lab 04/09/20 1301 04/11/20 1140 04/12/20 0027 04/13/20 0041 04/14/20 0712 04/14/20 0713  AST 40 120* 144* 104*  --  62*  ALT 69* 134* 172* 169*  --  132*  ALKPHOS 61 64 59 59  --  61  BILITOT 3.3* 3.9* 4.0* 4.5*  --  4.6*  PROT 6.3* 6.8 6.6 6.1*  --  6.4*  ALBUMIN 2.7* 2.9* 2.9* 2.5* 2.6* 2.6*   Recent Labs  Lab 04/11/20 1140 04/11/20 1841 04/12/20 0027 04/13/20 0041 04/14/20 0713  LIPASE 107* 135* 155* 201* 142*  AMYLASE  --   --  159*  --   --    No results for input(s): AMMONIA in the last 168 hours.  CBC: Recent Labs  Lab 04/09/20 1301 04/11/20 1140 04/11/20 1841 04/12/20 0027 04/12/20 0752 04/12/20 2046 04/13/20 1040 04/14/20 0713  WBC 6.9 6.9  --   --   --   --  7.3 7.3  NEUTROABS 5.6 4.9  --   --   --   --   --   --   HGB 16.0 16.2   < > 16.0  15.8 16.3 15.5 15.6  HCT 50.8 50.2   < > 51.4 50.4 50.8 48.7 48.9  MCV 80.5 79.4*  --   --   --   --  79.4* 79.1*  PLT 175 146*  --   --   --   --  108* 149*   < > = values in this interval not displayed.    Cardiac Enzymes: No results for input(s): CKTOTAL, CKMB, CKMBINDEX, TROPONINI in the last 168 hours.  BNP: BNP (last 3 results) Recent Labs    04/12/20 1216  BNP 1,153.9*    ProBNP (last 3 results) No results for input(s): PROBNP in the last 8760 hours.   CBG: No results for input(s): GLUCAP in the last 168 hours.  Coagulation Studies: Recent Labs    04/12/20 1552  LABPROT 21.2*  INR 1.9*     Imaging   No results found.   Medications:     Current Medications: . allopurinol  300 mg Oral Daily  . buPROPion  100 mg Oral BID  . Chlorhexidine Gluconate Cloth  6 each Topical Daily  . digoxin  0.125 mg Oral Daily  . metoCLOPramide (REGLAN) injection  5 mg Intravenous Q6H  . morphine      . pantoprazole (PROTONIX) IV  40 mg Intravenous Q12H  . sodium chloride flush   10-40 mL Intracatheter Q12H  . sucralfate  1 g Oral TID WC & HS  . thiamine injection  100 mg Intravenous Daily    Infusions: . furosemide (LASIX) 200 mg in dextrose 5% 100 mL (2mg /mL) infusion 20 mg/hr (04/14/20 5597)  . heparin 2,300 Units/hr (04/14/20 0346)  . milrinone 0.125 mcg/kg/min (04/13/20 2053)  . piperacillin-tazobactam (ZOSYN)  IV 3.375 g (04/14/20 0232)  . promethazine (PHENERGAN) injection         Assessment/Plan   Systolic Biventricular CHF: -ECHO with large LV thromus, EF <20%, RV dysfunction, dilated LV, no real valve disease, + for RWMA, diastolic function not evaluated -do not suspect this is acute likely began months ago -NYHA class III symptoms -does have uncontrolled HTN and obesity with likely OSA but systolic dysfunction  out of proportion to degree of HTN -tested many times for covid and never positive, Flu neg, HIV negative not sure if a RVP would be useful this far out but will order -TSH normal -less likely amyloid or infiltrative disease with dilation and minimal hypertrophy, MM panel pending -no calcifications of coronaries on imaging, risk factors include obesity, HTN, early erectile dysfunction, RWMA on echo -with palpitation history may benefit from cardiac monitor going forward -Need to rule out sarcoid with clubbing, dilated cardiomyopathy and GI findings -needs outpatient sleep study likely has OSA  -repeat coox yesterday much lower at 48% -cvp 17, started on milrinone and lasix gtt, coox 63.8 on 0.125 milrinone, CVP down to 11 today, 5L out overnight -Needs L/RHC tentatively tomorrow -follow coox, renal function -MRI today  LV thrombus: -2.3 x 3.6 cm apical thrombus -currently on heparin, keep for now transition to oral anticoagulant later  Tachycardia: -seems to be long standing since 2013 around 100-105 but worse during this admission due to decompensation  Acute Cholecystitis vs hepatic congestion: -conservative management with abx,  surgery following -decongest   Acute RLE DVT: -on heparin  hemoptysis:  -he describes mix of blood and sputum from coughing more so than related to his vomiting, this also correlates with the findings on recent EGD -low risk for TB  -may also have PE but could just  be from coughing and anticoagulation, already on heparin   Polycythemia:  -chronic likely due to chronic hypoxic state -testing epo and JAK mutations  Length of Stay: 2  Katherine Roan, MD  04/14/2020, 9:25 AM  Advanced Heart Failure Team Pager [phone] (M-F; [place])  Please contact [facility] for night-coverage after hours (4p -7a ) and weekends on [email or web address]  Patient seen and examined with the above-signed Advanced Practice Provider and/or Housestaff. I personally reviewed laboratory data, imaging studies and relevant notes. I independently examined the patient and formulated the important aspects of the plan. I have edited the note to reflect any of my changes or salient points. I have personally discussed the plan with the patient and/or family.  Coox improving on milrinone. Diuresing briskly but remains somewhat SOB. CVP 12. On heparin for LV clot. No bleeding. HIDA + but ab pain improved. No f/c. WBC ok.   General:  Sitting up in bed No resp difficulty HEENT: normal Neck: supple. JVP to jaw Carotids 2+ bilat; no bruits. No lymphadenopathy or thryomegaly appreciated. Cor: PMI nondisplaced. Tachy regular + s3 Lungs: clear Abdomen: soft, nontender, nondistended. No hepatosplenomegaly. No bruits or masses. Good bowel sounds. Extremities: no cyanosis, clubbing, rash, 1+ dema Neuro: alert & orientedx3, cranial nerves grossly intact. moves all 4 extremities w/o difficulty. Affect pleasant  Improved with milrinone but remains very tenuous from HF standpoint. Continue diuresis. Titrate GDMT. Continue heparin. R/L cath probably tomorrow. +cMRI. Despite + HIDA I suspect most of liver symptoms related to HF.  Glori Bickers, MD  10:47 PM

## 2020-04-15 ENCOUNTER — Inpatient Hospital Stay (HOSPITAL_COMMUNITY): Payer: 59

## 2020-04-15 ENCOUNTER — Encounter (HOSPITAL_COMMUNITY)
Admission: EM | Disposition: A | Discharge status: Home / Self Care | Payer: Self-pay | Source: Home / Self Care | Attending: Internal Medicine

## 2020-04-15 DIAGNOSIS — K810 Acute cholecystitis: Secondary | ICD-10-CM | POA: Diagnosis not present

## 2020-04-15 DIAGNOSIS — I5021 Acute systolic (congestive) heart failure: Secondary | ICD-10-CM | POA: Diagnosis not present

## 2020-04-15 DIAGNOSIS — I428 Other cardiomyopathies: Secondary | ICD-10-CM

## 2020-04-15 DIAGNOSIS — I82411 Acute embolism and thrombosis of right femoral vein: Secondary | ICD-10-CM | POA: Diagnosis not present

## 2020-04-15 DIAGNOSIS — T4145XD Adverse effect of unspecified anesthetic, subsequent encounter: Secondary | ICD-10-CM | POA: Diagnosis not present

## 2020-04-15 DIAGNOSIS — R112 Nausea with vomiting, unspecified: Secondary | ICD-10-CM | POA: Diagnosis not present

## 2020-04-15 HISTORY — PX: RIGHT/LEFT HEART CATH AND CORONARY ANGIOGRAPHY: CATH118266

## 2020-04-15 LAB — POCT I-STAT 7, (LYTES, BLD GAS, ICA,H+H)
Acid-Base Excess: 11 mmol/L — ABNORMAL HIGH (ref 0.0–2.0)
Bicarbonate: 36.2 mmol/L — ABNORMAL HIGH (ref 20.0–28.0)
Calcium, Ion: 0.97 mmol/L — ABNORMAL LOW (ref 1.15–1.40)
HCT: 51 % (ref 39.0–52.0)
Hemoglobin: 17.3 g/dL — ABNORMAL HIGH (ref 13.0–17.0)
O2 Saturation: 97 %
Potassium: 3.2 mmol/L — ABNORMAL LOW (ref 3.5–5.1)
Sodium: 142 mmol/L (ref 135–145)
TCO2: 38 mmol/L — ABNORMAL HIGH (ref 22–32)
pCO2 arterial: 46.7 mmHg (ref 32.0–48.0)
pH, Arterial: 7.498 — ABNORMAL HIGH (ref 7.350–7.450)
pO2, Arterial: 88 mmHg (ref 83.0–108.0)

## 2020-04-15 LAB — COOXEMETRY PANEL
Carboxyhemoglobin: 1.1 % (ref 0.5–1.5)
Carboxyhemoglobin: 1.1 % (ref 0.5–1.5)
Methemoglobin: 0.8 % (ref 0.0–1.5)
Methemoglobin: 0.6 % (ref 0.0–1.5)
O2 Saturation: 63.9 %
O2 Saturation: 66.7 %
Total hemoglobin: 16.7 g/dL — ABNORMAL HIGH (ref 12.0–16.0)
Total hemoglobin: 16 g/dL (ref 12.0–16.0)

## 2020-04-15 LAB — LUPUS ANTICOAGULANT PANEL
DRVVT: 57.3 s — ABNORMAL HIGH (ref 0.0–47.0)
PTT Lupus Anticoagulant: 35.5 s (ref 0.0–51.9)

## 2020-04-15 LAB — POCT I-STAT EG7
Acid-Base Excess: 12 mmol/L — ABNORMAL HIGH (ref 0.0–2.0)
Acid-Base Excess: 13 mmol/L — ABNORMAL HIGH (ref 0.0–2.0)
Bicarbonate: 38.9 mmol/L — ABNORMAL HIGH (ref 20.0–28.0)
Bicarbonate: 40.1 mmol/L — ABNORMAL HIGH (ref 20.0–28.0)
Calcium, Ion: 1.04 mmol/L — ABNORMAL LOW (ref 1.15–1.40)
Calcium, Ion: 1.08 mmol/L — ABNORMAL LOW (ref 1.15–1.40)
HCT: 53 % — ABNORMAL HIGH (ref 39.0–52.0)
HCT: 54 % — ABNORMAL HIGH (ref 39.0–52.0)
Hemoglobin: 18 g/dL — ABNORMAL HIGH (ref 13.0–17.0)
Hemoglobin: 18.4 g/dL — ABNORMAL HIGH (ref 13.0–17.0)
O2 Saturation: 67 %
O2 Saturation: 68 %
Potassium: 3.3 mmol/L — ABNORMAL LOW (ref 3.5–5.1)
Potassium: 3.4 mmol/L — ABNORMAL LOW (ref 3.5–5.1)
Sodium: 140 mmol/L (ref 135–145)
Sodium: 140 mmol/L (ref 135–145)
TCO2: 41 mmol/L — ABNORMAL HIGH (ref 22–32)
TCO2: 42 mmol/L — ABNORMAL HIGH (ref 22–32)
pCO2, Ven: 53.9 mmHg (ref 44.0–60.0)
pCO2, Ven: 55.6 mmHg (ref 44.0–60.0)
pH, Ven: 7.466 — ABNORMAL HIGH (ref 7.250–7.430)
pH, Ven: 7.467 — ABNORMAL HIGH (ref 7.250–7.430)
pO2, Ven: 34 mmHg (ref 32.0–45.0)
pO2, Ven: 34 mmHg (ref 32.0–45.0)

## 2020-04-15 LAB — RENAL FUNCTION PANEL
Albumin: 2.6 g/dL — ABNORMAL LOW (ref 3.5–5.0)
Anion gap: 15 (ref 5–15)
BUN: 24 mg/dL — ABNORMAL HIGH (ref 6–20)
CO2: 34 mmol/L — ABNORMAL HIGH (ref 22–32)
Calcium: 8.8 mg/dL — ABNORMAL LOW (ref 8.9–10.3)
Chloride: 90 mmol/L — ABNORMAL LOW (ref 98–111)
Creatinine, Ser: 1.4 mg/dL — ABNORMAL HIGH (ref 0.61–1.24)
GFR, Estimated: >60 mL/min (ref >60)
Glucose, Bld: 116 mg/dL — ABNORMAL HIGH (ref 70–99)
Phosphorus: 5.2 mg/dL — ABNORMAL HIGH (ref 2.5–4.6)
Potassium: 3.8 mmol/L (ref 3.5–5.1)
Sodium: 139 mmol/L (ref 135–145)

## 2020-04-15 LAB — HOMOCYSTEINE: Homocysteine: 12.9 umol/L (ref 0.0–14.5)

## 2020-04-15 LAB — HEPATIC FUNCTION PANEL
ALT: 111 U/L — ABNORMAL HIGH (ref 0–44)
AST: 56 U/L — ABNORMAL HIGH (ref 15–41)
Albumin: 2.7 g/dL — ABNORMAL LOW (ref 3.5–5.0)
Alkaline Phosphatase: 63 U/L (ref 38–126)
Bilirubin, Direct: 2.2 mg/dL — ABNORMAL HIGH (ref 0.0–0.2)
Indirect Bilirubin: 2.8 mg/dL — ABNORMAL HIGH (ref 0.3–0.9)
Total Bilirubin: 5 mg/dL — ABNORMAL HIGH (ref 0.3–1.2)
Total Protein: 6.8 g/dL (ref 6.5–8.1)

## 2020-04-15 LAB — CBC
HCT: 49.4 % (ref 39.0–52.0)
Hemoglobin: 16.3 g/dL (ref 13.0–17.0)
MCH: 25.5 pg — ABNORMAL LOW (ref 26.0–34.0)
MCHC: 33 g/dL (ref 30.0–36.0)
MCV: 77.4 fL — ABNORMAL LOW (ref 80.0–100.0)
Platelets: 175 10*3/uL (ref 150–400)
RBC: 6.38 MIL/uL — ABNORMAL HIGH (ref 4.22–5.81)
RDW: 18 % — ABNORMAL HIGH (ref 11.5–15.5)
WBC: 7.5 10*3/uL (ref 4.0–10.5)
nRBC: 0.3 % — ABNORMAL HIGH (ref 0.0–0.2)

## 2020-04-15 LAB — DRVVT CONFIRM: dRVVT Confirm: 1.1 ratio (ref 0.8–1.2)

## 2020-04-15 LAB — HEPARIN LEVEL (UNFRACTIONATED): Heparin Unfractionated: 0.37 IU/mL (ref 0.30–0.70)

## 2020-04-15 LAB — DRVVT MIX: dRVVT Mix: 41.8 s — ABNORMAL HIGH (ref 0.0–40.4)

## 2020-04-15 LAB — LIPASE, BLOOD: Lipase: 165 U/L — ABNORMAL HIGH (ref 11–51)

## 2020-04-15 LAB — ERYTHROPOIETIN: Erythropoietin: 32.8 m[IU]/mL — ABNORMAL HIGH (ref 2.6–18.5)

## 2020-04-15 IMAGING — CT CT ANGIO CHEST
2 of 7 series · 17 of 46 positions shown · IV contrast (omnipaque)
Comparison: [DATE], [DATE]

CLINICAL DATA: Chest pain, left ventricular thrombus on MRI,
cardiac dysfunction

EXAM:
CT ANGIOGRAPHY CHEST WITH CONTRAST
TECHNIQUE: Multidetector CT imaging of the chest was performed using the
standard protocol during bolus administration of intravenous
contrast. Multiplanar CT image reconstructions and MIPs were
obtained to evaluate the vascular anatomy.
CONTRAST:  54mL OMNIPAQUE IOHEXOL 350 MG/ML SOLN

[Series 6: thins · axial · 0.90mm/px · z∈[+1174,+1392]mm · 14 of 352 slices shown]
[im 20/352  lung]
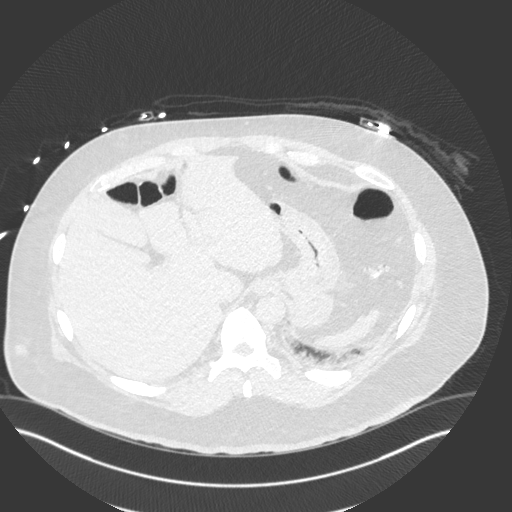
[im 40/352  soft-tissue]
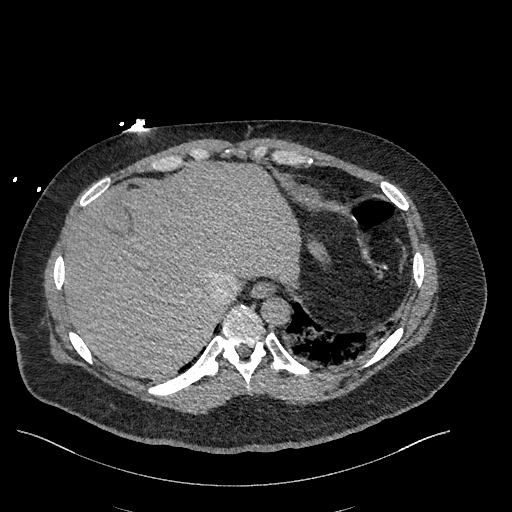
[im 79/352  lung]
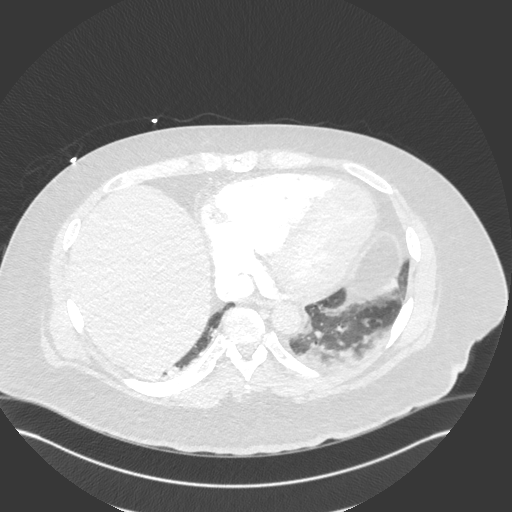
[im 98/352  soft-tissue]
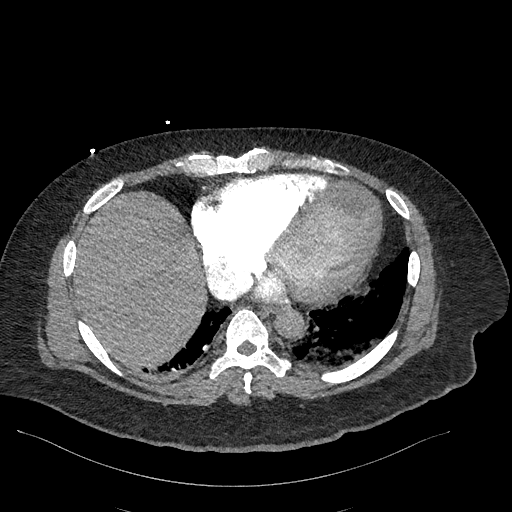
[im 118/352  lung]
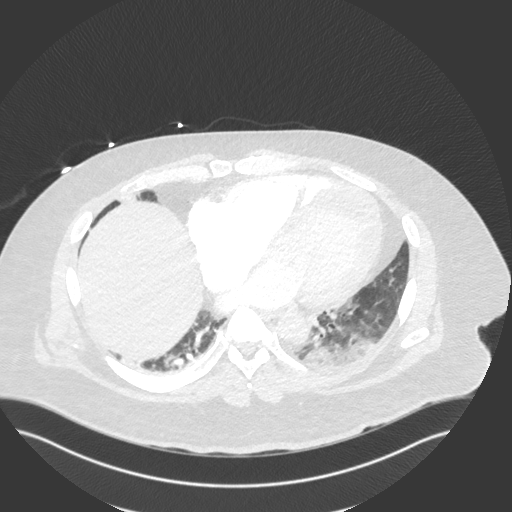
[im 137/352  soft-tissue]
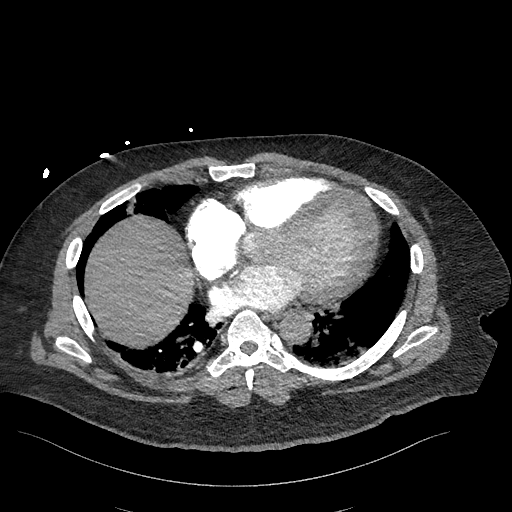
[im 157/352  lung]
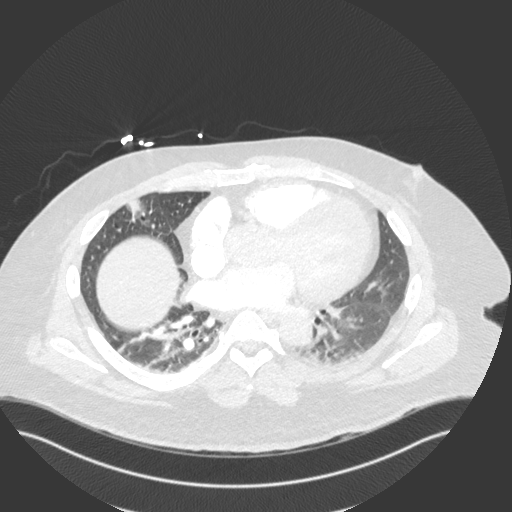
[im 196/352  soft-tissue]
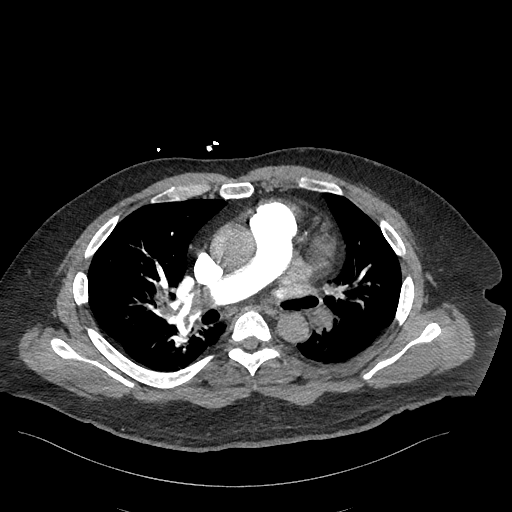
[im 215/352  lung]
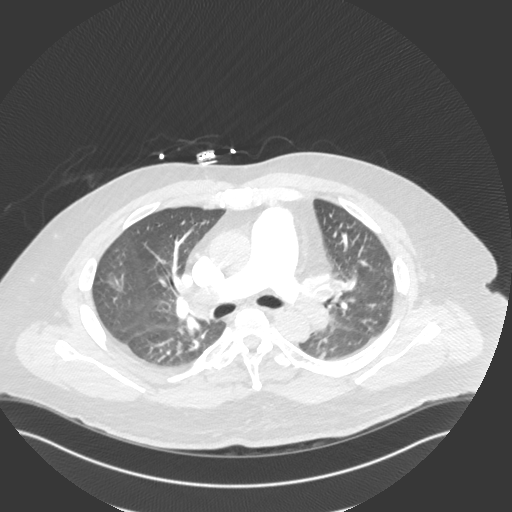
[im 235/352  soft-tissue]
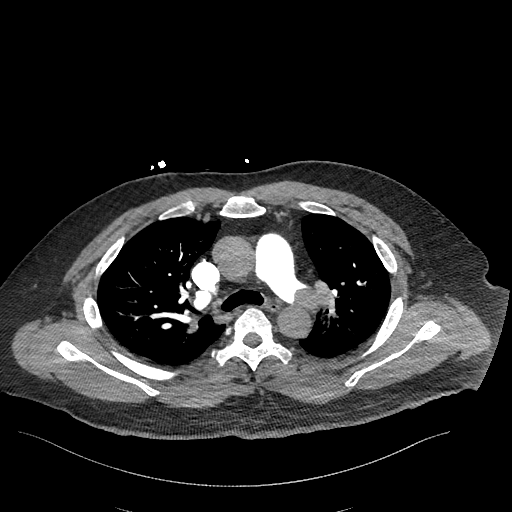
[im 254/352  lung]
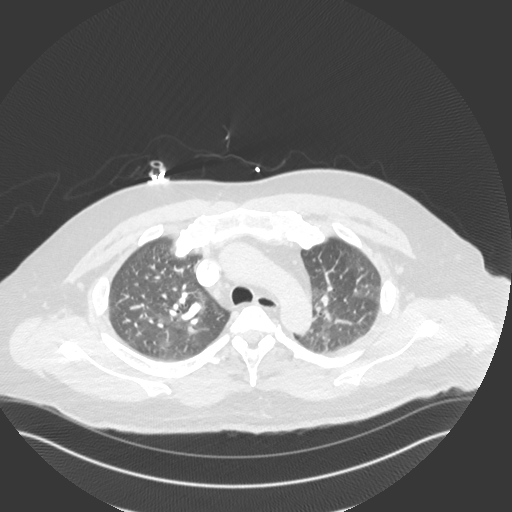
[im 274/352  soft-tissue]
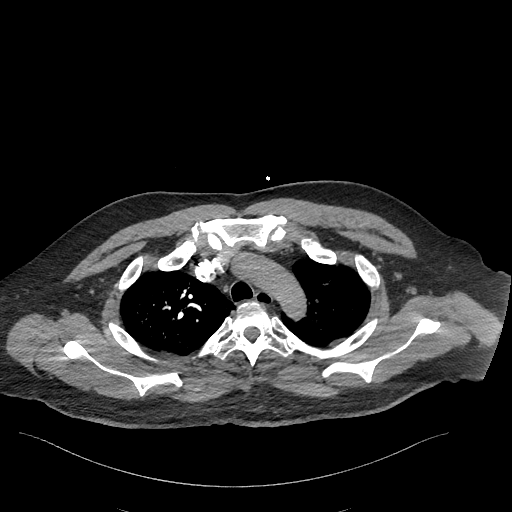
[im 313/352  lung]
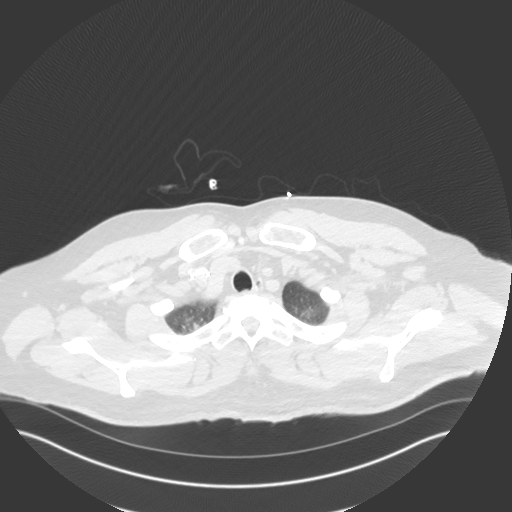
[im 332/352  soft-tissue]
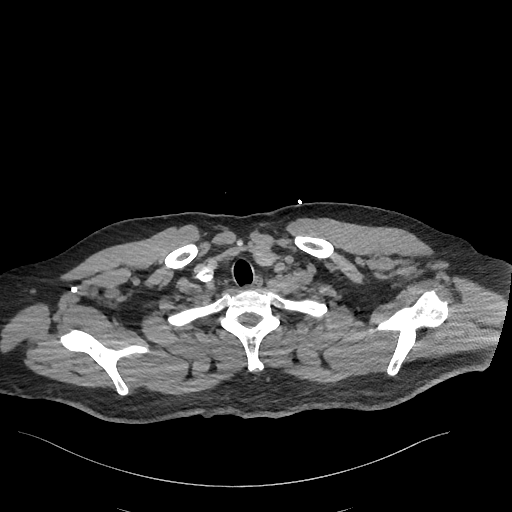

[Series 7: cor · coronal · 0.52mm/px · 3 of 158 slices shown]
[im 40/158  soft-tissue]
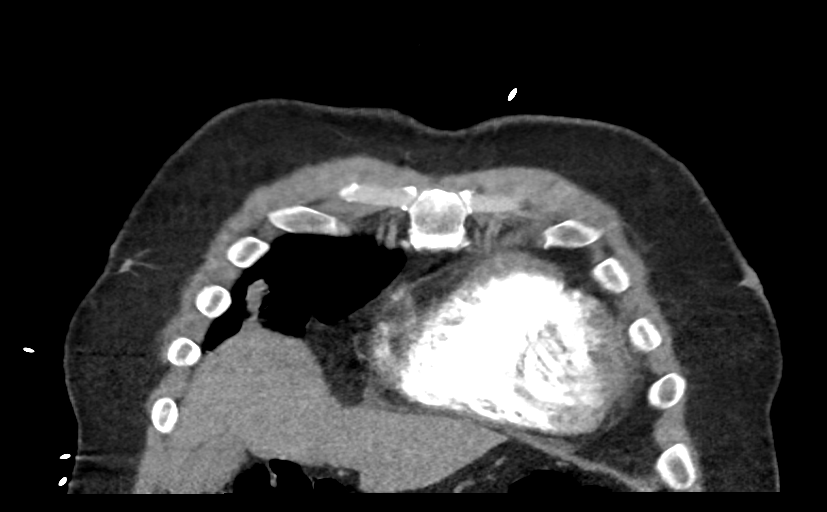
[im 79/158  soft-tissue]
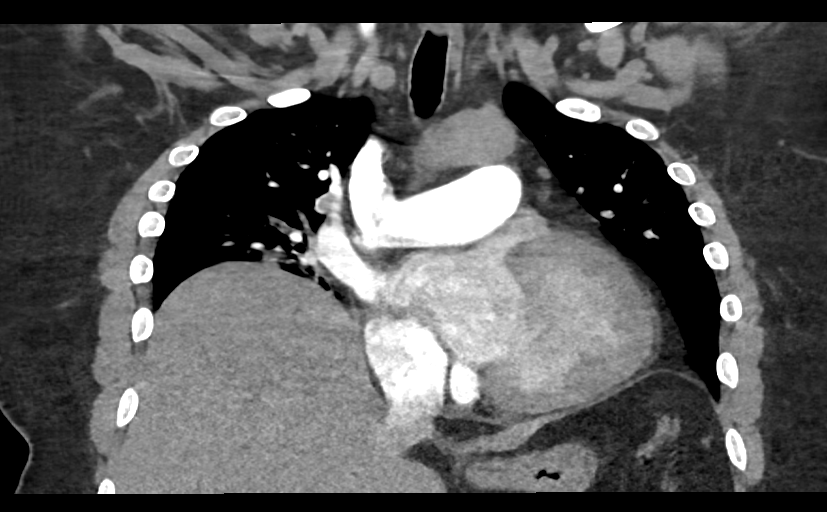
[im 118/158  soft-tissue]
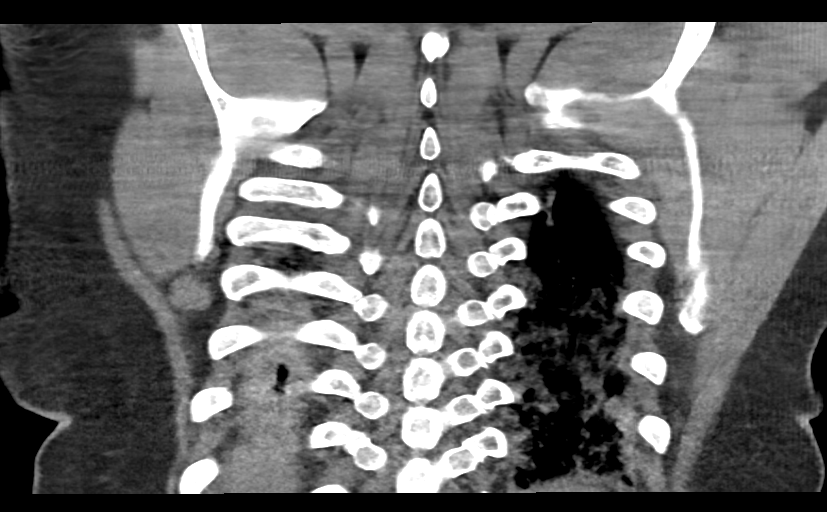

[17 of 46 positions shown; findings below may reference images not displayed]

FINDINGS: Cardiovascular: This is a technically adequate evaluation of the
pulmonary vasculature. There are large central pulmonary emboli seen
bilaterally within the main pulmonary arteries, and extending into
the segmental branches left greater than right. There is severe clot
burden, with evidence of right heart strain with the RV/LV ratio
measuring approximately 1.2.

The heart is enlarged without pericardial effusion. Normal caliber
of the thoracic aorta.

Mediastinum/Nodes: No enlarged mediastinal, hilar, or axillary lymph
nodes. Thyroid gland, trachea, and esophagus demonstrate no
significant findings.

Lungs/Pleura: Hypoventilatory changes are seen bilaterally within
the lungs. Small peripheral areas of wedge-shaped consolidation are
seen within the right upper and right middle lobes consistent with
pulmonary infarcts. No effusion or pneumothorax. Central airways are
patent.

Upper Abdomen: No acute abnormality.

Musculoskeletal: No acute or destructive bony lesions. Reconstructed
images demonstrate no additional findings.

Review of the MIP images confirms the above findings.
IMPRESSION: 1. Acute bilateral central pulmonary emboli. Positive for acute PE
with CT evidence of right heart strain (RV/LV Ratio = 1.2)
consistent with at least submassive (intermediate risk) PE. The
presence of right heart strain has been associated with an increased
risk of morbidity and mortality.
2. Cardiomegaly.
3. Bilateral hypoventilatory changes, with scattered peripheral
areas of consolidation concerning for developing pulmonary infarcts.

Critical Value/emergent results were called by telephone at the time
of interpretation on [DATE] at [DATE] to provider Dr. JUMPER
JUMPER, who verbally acknowledged these results.

## 2020-04-15 IMAGING — DX DG CHEST 1V PORT
1 series · 1 of 1 positions shown · non-contrast
Comparison: Radiograph [DATE]

CLINICAL DATA: Chest pain at rest.

EXAM:
PORTABLE CHEST 1 VIEW

[chest ap]
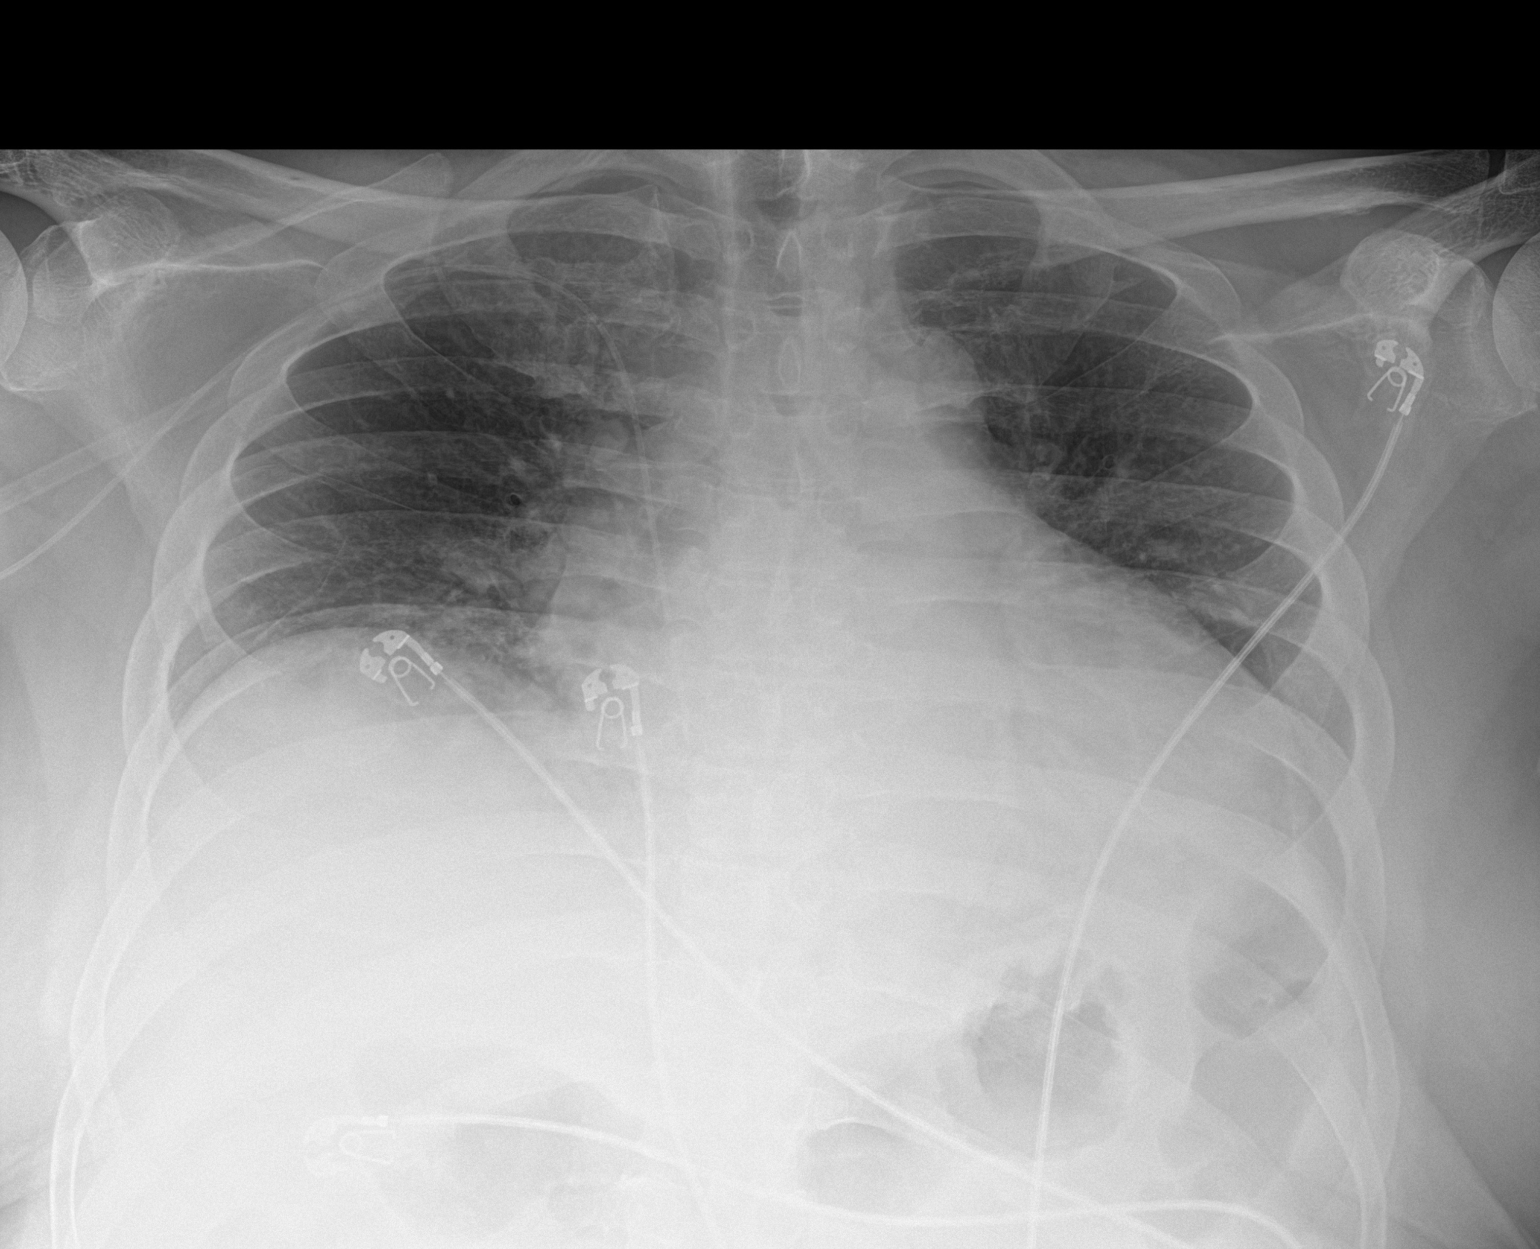

[1 of 1 positions shown; findings below may reference images not displayed]

FINDINGS: Right upper extremity PICC tip at the atrial caval junction. Lower
lung volumes from prior exam leading to bronchovascular crowding.
Cardiomegaly. No convincing pulmonary edema or pleural effusion. No
pneumothorax. Elevation of right hemidiaphragm.
IMPRESSION: Cardiomegaly. Lower lung volumes from prior exam leading to
bronchovascular crowding.

## 2020-04-15 IMAGING — MR MR CARD MORPHOLOGY WO/W CM
45 of 48 series · 45 of 48 positions shown · IV contrast (Contrast agent)
Comparison: none

CLINICAL DATA: Clinical question of cardiomyopathy and left
ventricular mass
49 year old Male

Study assumes HCT of 49
EXAM:
CARDIAC MRI
TECHNIQUE: The patient was scanned on a 1.5 Tesla GE magnet. A dedicated
cardiac coil was used. Functional imaging was done using Fiesta
sequences. [DATE], and 4 chamber views were done to assess for RWMA's.
Modified DATABEX rule using a short axis stack was used to
calculate an ejection fraction on a dedicated work station using
Circle software. The patient received 10 cc of Gadavist. After 10
minutes inversion recovery sequences were used to assess for
infiltration and scar tissue.
CONTRAST:  10 cc  of Gadavist

[Series 4: t2_haste_db_tra_bh · axial · 8.0mm · 1.56mm/px · 1 of 20 slices shown]
[im 1/20]
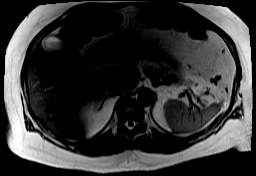

[Series 8: bSSFP · oblique · 8.0mm · 1.79mm/px · 1 of 25 slices shown (1 of 23)]
[im 1/25]
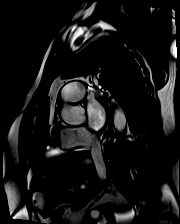

[Series 9: bSSFP · oblique · 8.0mm · 1.79mm/px · 1 of 25 slices shown (2 of 23)]
[im 1/25]
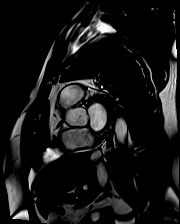

[Series 10: bSSFP · oblique · 8.0mm · 1.79mm/px · 1 of 25 slices shown (3 of 23)]
[im 1/25]
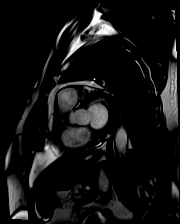

[Series 11: bSSFP · oblique · 8.0mm · 1.79mm/px · 1 of 25 slices shown (4 of 23)]
[im 1/25]
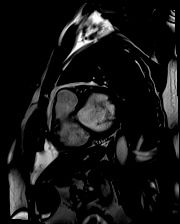

[Series 12: bSSFP · oblique · 8.0mm · 1.79mm/px · 1 of 25 slices shown (5 of 23)]
[im 1/25]
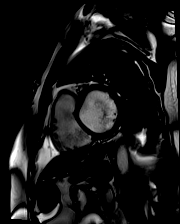

[Series 13: bSSFP · oblique · 8.0mm · 1.79mm/px · 1 of 25 slices shown (6 of 23)]
[im 1/25]
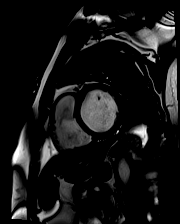

[Series 14: bSSFP · oblique · 8.0mm · 1.79mm/px · 1 of 25 slices shown (7 of 23)]
[im 1/25]
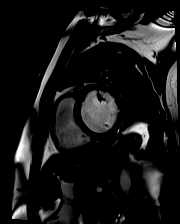

[Series 15: bSSFP · oblique · 8.0mm · 1.79mm/px · 1 of 25 slices shown (8 of 23)]
[im 1/25]
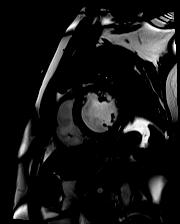

[Series 16: bSSFP · oblique · 8.0mm · 1.79mm/px · 1 of 25 slices shown (9 of 23)]
[im 1/25]
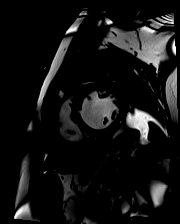

[Series 17: bSSFP · oblique · 8.0mm · 1.79mm/px · 1 of 25 slices shown (10 of 23)]
[im 1/25]
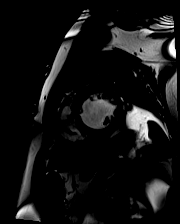

[Series 18: bSSFP · oblique · 8.0mm · 1.79mm/px · 1 of 25 slices shown (11 of 23)]
[im 1/25]
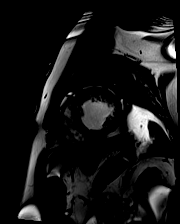

[Series 19: bSSFP · oblique · 8.0mm · 1.79mm/px · 1 of 25 slices shown (12 of 23)]
[im 1/25]
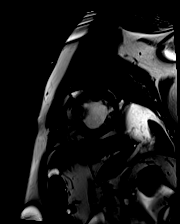

[Series 20: bSSFP · oblique · 8.0mm · 1.79mm/px · 1 of 25 slices shown (13 of 23)]
[im 1/25]
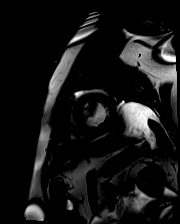

[Series 21: bSSFP · oblique · 8.0mm · 1.79mm/px · 1 of 25 slices shown (14 of 23)]
[im 1/25]
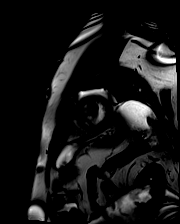

[Series 22: bSSFP · oblique · 8.0mm · 1.79mm/px · 1 of 25 slices shown (15 of 23)]
[im 1/25]
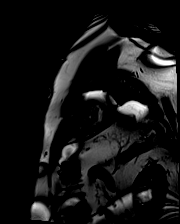

[Series 23: bSSFP · oblique · 8.0mm · 1.79mm/px · 1 of 25 slices shown (16 of 23)]
[im 1/25]
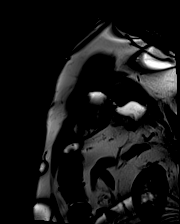

[Series 24: bSSFP · oblique · 8.0mm · 1.79mm/px · 1 of 25 slices shown (17 of 23)]
[im 1/25]
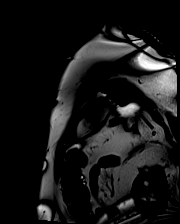

[Series 25: bSSFP · oblique · 8.0mm · 1.79mm/px · 1 of 25 slices shown (18 of 23)]
[im 1/25]
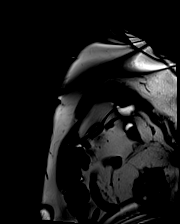

[Series 26: bSSFP · oblique · 8.0mm · 1.79mm/px · 1 of 25 slices shown (19 of 23)]
[im 1/25]
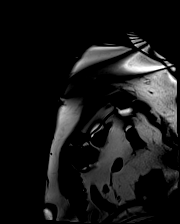

[Series 27: bSSFP · oblique · 6.0mm · 1.41mm/px · 1 of 25 slices shown (20 of 23)]
[im 1/25]
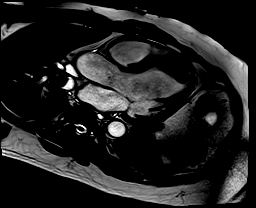

[Series 28: bSSFP · oblique · 6.0mm · 1.41mm/px · 1 of 25 slices shown (21 of 23)]
[im 1/25]
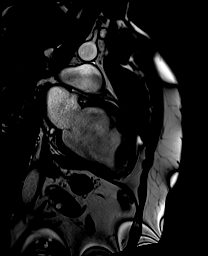

[Series 29: bSSFP · axial · 6.0mm · 1.41mm/px · 1 of 25 slices shown (22 of 23)]
[im 1/25]
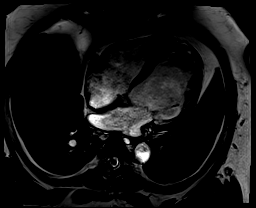

[Series 30: (id)_long_t1 · oblique · 8.0mm · 2.08mm/px · 1 of 24 slices shown]
[im 1/24]
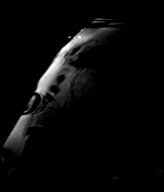

[Series 31: (id)_long_t1_moco · oblique · 8.0mm · 2.08mm/px · 1 of 24 slices shown]
[im 1/24]
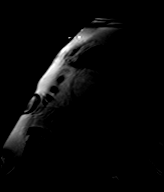

[Series 32: (id)_long_t1_moco_t1 · oblique · 8.0mm · 2.08mm/px · 1 of 5 slices shown]
[im 1/5]
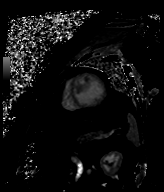

[Series 34: (id)_trufi · oblique · 8.0mm · 2.08mm/px · 1 of 9 slices shown]
[im 1/9]
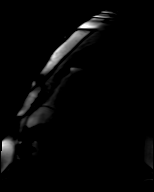

[Series 35: (id)_trufi_moco · oblique · 8.0mm · 2.08mm/px · 1 of 9 slices shown]
[im 1/9]
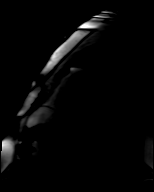

[Series 36: (id)_trufi_moco_t2 · oblique · 8.0mm · 2.08mm/px · 1 of 3 slices shown]
[im 1/3]
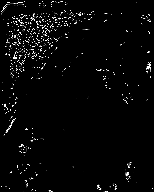

[Series 38: pre short axis · oblique · non-contrast · 8.0mm · 2.25mm/px · 1 of 10 slices shown (1 of 6)]
[im 1/10]
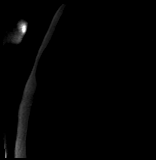

[Series 39: pre short axis · oblique · non-contrast · 8.0mm · 2.25mm/px · 1 of 10 slices shown (2 of 6)]
[im 1/10]
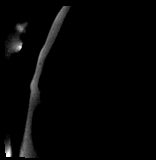

[Series 40: pre short axis · oblique · non-contrast · 8.0mm · 2.25mm/px · 1 of 10 slices shown (3 of 6)]
[im 1/10]
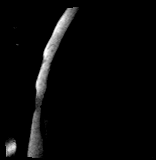

[Series 41: pre short axis · oblique · non-contrast · 8.0mm · 2.25mm/px · 1 of 10 slices shown (4 of 6)]
[im 1/10]
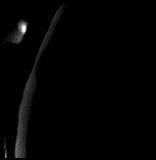

[Series 42: pre short axis · oblique · non-contrast · 8.0mm · 2.25mm/px · 1 of 10 slices shown (5 of 6)]
[im 1/10]
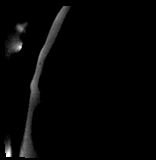

[Series 43: pre short axis · oblique · non-contrast · 8.0mm · 2.25mm/px · 1 of 10 slices shown (6 of 6)]
[im 1/10]
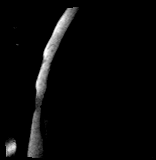

[Series 46: t1_tse_db short axis · oblique · 6.0mm · 1.51mm/px · 1 of 3 slices shown]
[im 1/3]
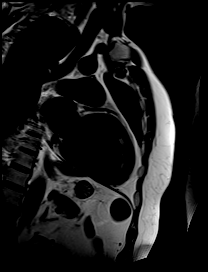

[Series 47: t1_tse_fs db short · oblique · 6.0mm · 1.51mm/px · 1 of 3 slices shown]
[im 1/3]
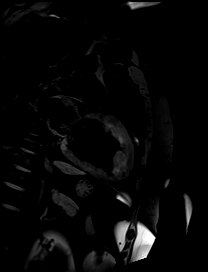

[Series 48: t2_tse_fs db short · oblique · 6.0mm · 1.71mm/px · 1 of 3 slices shown]
[im 1/3]
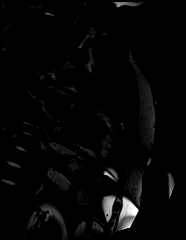

[Series 49: t2_stir_db_short axis · oblique · 6.0mm · 1.97mm/px · 1 of 3 slices shown]
[im 1/3]
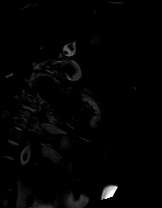

[Series 50: bSSFP · axial · 6.0mm · 1.41mm/px · 1 of 25 slices shown (23 of 23)]
[im 1/25]
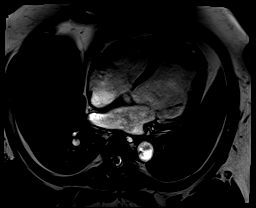

[Series 51: rest short axis · oblique · 8.0mm · 2.25mm/px · 1 of 60 slices shown (1 of 5)]
[im 1/60]
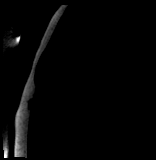

[Series 52: rest short axis · oblique · 8.0mm · 2.25mm/px · 1 of 60 slices shown (2 of 5)]
[im 1/60]
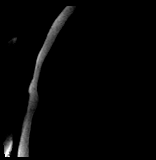

[Series 53: rest short axis · oblique · 8.0mm · 2.25mm/px · 1 of 60 slices shown (3 of 5)]
[im 1/60]
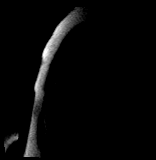

[Series 54: rest short axis · oblique · 8.0mm · 2.25mm/px · 1 of 60 slices shown (4 of 5)]
[im 1/60]
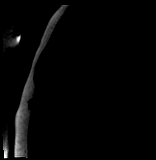

[Series 55: rest short axis · oblique · 8.0mm · 2.25mm/px · 1 of 60 slices shown (5 of 5)]
[im 1/60]
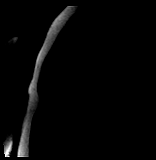

[45 of 48 positions shown; findings below may reference images not displayed]

FINDINGS: 1. Severe left ventricular dilation, with LVEDD 71 mm, and LVEDVi
130 mL/m2.

Normal left ventricular thickness, with intraventricular septal
thickness of 7 mm, posterior wall thickness of 5 mm.

Severe left ventricular systolic dysfunction (LVEF =12%). There is
severe global hypokinesis with apical dyskinesis.

Left ventricular parametric mapping notable for diffuse increase in
ECV signal in the apex (30-40%). Normal T2 signal.

There is no late gadolinium enhancement in the left ventricular
myocardium.

There is a density 26 mm X 23 mm X 10.5 mm in the left ventricular
apex that does not perfuse with contrast consistent with LV
thrombus.

2. Severe right ventricular dilation with RVEDVI 104 mL/m2.

Normal right ventricular thickness.

Severe right ventricular systolic dysfunction (RVEF =21 %). Severe
global ventricular hypokinesis with apical akinesis.

3.  Normal size of the left and moderate right atrial dilation:

4. Normal size of the aortic root, ascending aorta. Pulmonary artery
is mildly dilated at 40 mm.

5. Tricuspid regurgitation and mitral regurgitation is visualized
qualitatively.

6.  Normal pericardium.  No pericardial effusion.

7. Grossly, no extracardiac findings. Recommended dedicated study if
concerned for non-cardiac pathology.
IMPRESSION: 1.  Severe left ventricular dilation.

2. Severe left ventricular systolic dysfunction (LVEF =12%). There
is severe global hypokinesis with apical dyskinesis.

3.  Diffuse increase in ECV signal in the apex (30-40%).

4.  There is likely an LV thrombus.

5.  Severe right ventricular dilation.

6. Severe right ventricular systolic dysfunction (RVEF =21 %).
Severe global ventricular hypokinesis with apical akinesis.

7.  Moderate right atrial dilation

8.  Pulmonary artery is mildly dilated at 40 mm

9. Tricuspid regurgitation and mitral regurgitation is visualized
qualitatively.

Study consistent with dilated biventricular cardiomyopathy with LV
thrombus

## 2020-04-15 SURGERY — RIGHT/LEFT HEART CATH AND CORONARY ANGIOGRAPHY
Anesthesia: LOCAL

## 2020-04-15 MED ORDER — SODIUM CHLORIDE 0.9% FLUSH
3.0000 mL | Freq: Two times a day (BID) | INTRAVENOUS | Status: DC
  Administered 2020-04-15 – 2020-04-23 (×11): 3 mL via INTRAVENOUS

## 2020-04-15 MED ORDER — ASPIRIN 81 MG PO CHEW: 81.0000 mg | CHEWABLE_TABLET | ORAL | Status: DC

## 2020-04-15 MED ORDER — IOHEXOL 350 MG/ML SOLN
INTRAVENOUS | Status: DC | PRN
  Administered 2020-04-15: 35 mL

## 2020-04-15 MED ORDER — LABETALOL HCL 5 MG/ML IV SOLN: 10.0000 mg | INTRAVENOUS | Status: AC | PRN

## 2020-04-15 MED ORDER — SODIUM CHLORIDE 0.9% FLUSH: 3.0000 mL | INTRAVENOUS | Status: DC | PRN

## 2020-04-15 MED ORDER — GADOBUTROL 1 MMOL/ML IV SOLN
10.0000 mL | Freq: Once | INTRAVENOUS | Status: AC | PRN
  Administered 2020-04-15: 10 mL via INTRAVENOUS

## 2020-04-15 MED ORDER — MORPHINE SULFATE (PF) 2 MG/ML IV SOLN
2.0000 mg | INTRAVENOUS | Status: DC | PRN
  Administered 2020-04-15 – 2020-04-21 (×11): 2 mg via INTRAVENOUS
  Filled 2020-04-15 (×11): qty 1

## 2020-04-15 MED ORDER — NITROGLYCERIN 0.4 MG SL SUBL
SUBLINGUAL_TABLET | SUBLINGUAL | Status: AC
  Administered 2020-04-15: 0.4 mg
  Filled 2020-04-15: qty 1

## 2020-04-15 MED ORDER — ONDANSETRON HCL 4 MG/2ML IJ SOLN
4.0000 mg | Freq: Four times a day (QID) | INTRAMUSCULAR | Status: DC | PRN
Start: 2020-04-15 — End: 2020-04-15

## 2020-04-15 MED ORDER — HEPARIN (PORCINE) 25000 UT/250ML-% IV SOLN
2050.0000 [IU]/h | INTRAVENOUS | Status: DC
  Administered 2020-04-15: 2300 [IU]/h via INTRAVENOUS
  Administered 2020-04-16 (×2): 2500 [IU]/h via INTRAVENOUS
  Administered 2020-04-17 – 2020-04-18 (×3): 2450 [IU]/h via INTRAVENOUS
  Administered 2020-04-18: 2200 [IU]/h via INTRAVENOUS
  Filled 2020-04-15 (×8): qty 250

## 2020-04-15 MED ORDER — HEPARIN (PORCINE) IN NACL 1000-0.9 UT/500ML-% IV SOLN
INTRAVENOUS | Status: AC
  Filled 2020-04-15: qty 500

## 2020-04-15 MED ORDER — SODIUM CHLORIDE 0.9 % IV SOLN: INTRAVENOUS | Status: DC

## 2020-04-15 MED ORDER — SODIUM CHLORIDE 0.9% FLUSH: 3.0000 mL | Freq: Two times a day (BID) | INTRAVENOUS | Status: DC

## 2020-04-15 MED ORDER — FENTANYL CITRATE (PF) 100 MCG/2ML IJ SOLN
INTRAMUSCULAR | Status: DC | PRN
  Administered 2020-04-15: 25 ug via INTRAVENOUS

## 2020-04-15 MED ORDER — MELATONIN 5 MG PO TABS: 5.0000 mg | ORAL_TABLET | Freq: Every day | ORAL | Status: DC

## 2020-04-15 MED ORDER — HYDRALAZINE HCL 20 MG/ML IJ SOLN: 10.0000 mg | INTRAMUSCULAR | Status: AC | PRN

## 2020-04-15 MED ORDER — LIDOCAINE HCL (PF) 1 % IJ SOLN
INTRAMUSCULAR | Status: DC | PRN
  Administered 2020-04-15 (×2): 2 mL

## 2020-04-15 MED ORDER — SODIUM CHLORIDE 0.9 % IV SOLN: 250.0000 mL | INTRAVENOUS | Status: DC | PRN

## 2020-04-15 MED ORDER — MIDAZOLAM HCL 2 MG/2ML IJ SOLN
INTRAMUSCULAR | Status: DC | PRN
  Administered 2020-04-15: 1 mg via INTRAVENOUS

## 2020-04-15 MED ORDER — MIDAZOLAM HCL 2 MG/2ML IJ SOLN
INTRAMUSCULAR | Status: AC
  Filled 2020-04-15: qty 2

## 2020-04-15 MED ORDER — VERAPAMIL HCL 2.5 MG/ML IV SOLN
INTRAVENOUS | Status: AC
  Filled 2020-04-15: qty 2

## 2020-04-15 MED ORDER — LIDOCAINE HCL (PF) 1 % IJ SOLN
INTRAMUSCULAR | Status: AC
  Filled 2020-04-15: qty 30

## 2020-04-15 MED ORDER — MELATONIN 5 MG PO TABS
5.0000 mg | ORAL_TABLET | Freq: Every day | ORAL | Status: DC
  Administered 2020-04-15 – 2020-04-17 (×3): 5 mg via ORAL
  Filled 2020-04-15 (×5): qty 1

## 2020-04-15 MED ORDER — VERAPAMIL HCL 2.5 MG/ML IV SOLN
INTRAVENOUS | Status: DC | PRN
  Administered 2020-04-15: 10 mL via INTRA_ARTERIAL

## 2020-04-15 MED ORDER — HEPARIN SODIUM (PORCINE) 1000 UNIT/ML IJ SOLN
INTRAMUSCULAR | Status: DC | PRN
  Administered 2020-04-15: 6000 [IU] via INTRAVENOUS

## 2020-04-15 MED ORDER — ASPIRIN 81 MG PO CHEW
81.0000 mg | CHEWABLE_TABLET | ORAL | Status: AC
  Administered 2020-04-15: 81 mg via ORAL
  Filled 2020-04-15: qty 1

## 2020-04-15 MED ORDER — MORPHINE SULFATE (PF) 2 MG/ML IV SOLN
2.0000 mg | Freq: Once | INTRAVENOUS | Status: AC
  Administered 2020-04-15: 2 mg via INTRAVENOUS
  Filled 2020-04-15: qty 1

## 2020-04-15 MED ORDER — HYDROXYZINE HCL 10 MG PO TABS
10.0000 mg | ORAL_TABLET | Freq: Once | ORAL | Status: AC
  Administered 2020-04-15: 10 mg via ORAL
  Filled 2020-04-15: qty 1

## 2020-04-15 MED ORDER — IOHEXOL 350 MG/ML SOLN
54.0000 mL | Freq: Once | INTRAVENOUS | Status: AC | PRN
  Administered 2020-04-15: 54 mL via INTRAVENOUS

## 2020-04-15 MED ORDER — HEPARIN (PORCINE) IN NACL 1000-0.9 UT/500ML-% IV SOLN
INTRAVENOUS | Status: DC | PRN
  Administered 2020-04-15 (×2): 500 mL

## 2020-04-15 MED ORDER — FENTANYL CITRATE (PF) 100 MCG/2ML IJ SOLN
INTRAMUSCULAR | Status: AC
  Filled 2020-04-15: qty 2

## 2020-04-15 MED ORDER — POTASSIUM CHLORIDE 20 MEQ PO PACK
40.0000 meq | PACK | Freq: Once | ORAL | Status: AC
  Administered 2020-04-15: 40 meq via ORAL
  Filled 2020-04-15: qty 2

## 2020-04-15 MED ORDER — ACETAMINOPHEN 325 MG PO TABS: 650.0000 mg | ORAL_TABLET | ORAL | Status: DC | PRN

## 2020-04-15 MED ORDER — BOOST / RESOURCE BREEZE PO LIQD CUSTOM
1.0000 | Freq: Three times a day (TID) | ORAL | Status: DC
  Administered 2020-04-15 – 2020-04-24 (×6): 1 via ORAL

## 2020-04-15 SURGICAL SUPPLY — 9 items
CATH 5FR JL3.5 JR4 ANG PIG MP (CATHETERS) ×1 IMPLANT
CATH BALLN WEDGE 5F 110CM (CATHETERS) ×1 IMPLANT
DEVICE RAD COMP TR BAND LRG (VASCULAR PRODUCTS) ×1 IMPLANT
GLIDESHEATH SLEND SS 6F .021 (SHEATH) ×2 IMPLANT
GUIDEWIRE INQWIRE 1.5J.035X260 (WIRE) IMPLANT
INQWIRE 1.5J .035X260CM (WIRE) ×2
PACK CARDIAC CATHETERIZATION (CUSTOM PROCEDURE TRAY) ×2 IMPLANT
SHEATH GLIDE SLENDER 4/5FR (SHEATH) ×2 IMPLANT
TRANSDUCER W/STOPCOCK (MISCELLANEOUS) ×2 IMPLANT

## 2020-04-15 NOTE — Progress Notes (Signed)
Pharmacy Antibiotic Note  Kevin Baxter is a 50 y.o. male admitted on 04/11/2020 with intra-abdominal infection.  Pharmacy has been consulted for Zosyn dosing. WBC remains wnl. Afebrile. Scr trending up to 1.4, CrCl 87.2 ml/min.  Plan: Zosyn 3.375g IV Q8H infused over 4hrs. Follow up renal function, culture results, and clinical course. F/u LOT   Height: 6\' 1"  (185.4 cm) Weight: 127.2 kg (280 lb 8 oz) IBW/kg (Calculated) : 79.9  Temp (24hrs), Avg:97.9 F (36.6 C), Min:97.3 F (36.3 C), Max:98.2 F (36.8 C)  Recent Labs  Lab 04/09/20 1301 04/11/20 1140 04/11/20 1841 04/11/20 2111 04/12/20 0027 04/13/20 0041 04/13/20 1040 04/14/20 0712 04/14/20 0713 04/14/20 1850 04/15/20 0659  WBC 6.9 6.9  --   --   --   --  7.3  --  7.3  --  7.5  CREATININE 1.14 1.12  --   --  0.91 1.26*  --  1.31*  --  1.28* 1.40*  LATICACIDVEN  --   --  2.2* 2.6*  --   --   --   --   --   --   --     Estimated Creatinine Clearance: 87.2 mL/min (A) (by C-G formula based on SCr of 1.4 mg/dL (H)).    Allergies  Allergen Reactions  . Pseudoephedrine     REACTION: jittery    Antimicrobials this admission: 3/14 Zosyn >>   Dose adjustments this admission:  Microbiology results:  Richardine Service, PharmD, [place] PGY2 Cardiology Pharmacy Resident Phone: [phone] 04/15/2020  2:25 PM  Please check [email or web address] for unit-specific pharmacy phone numbers.

## 2020-04-15 NOTE — Progress Notes (Signed)
Patient returned from cath lab at 1600hrs.  Right radial TR band in place, site level zero.  Left brachial site level zero.  Reviewed post cath orders with patient and he verbalized understanding.

## 2020-04-15 NOTE — Progress Notes (Signed)
[facility] Progress Note     Subjective: CC-  Tolerated clear liquids yesterday. Denies abdominal pain, nausea, or vomiting with PO intake of liquids.  Going for cardiac cath today.  Objective: Vital signs in last 24 hours: Temp:  [97.3 F (36.3 C)-98.2 F (36.8 C)] 97.3 F (36.3 C) (03/17 0731) Pulse Rate:  [120-128] 120 (03/17 0900) Resp:  [20-26] 22 (03/17 0900) BP: (119-133)/(84-98) 119/93 (03/17 0731) SpO2:  [92 %-95 %] 94 % (03/17 0900) Weight:  [127.2 kg] 127.2 kg (03/17 0623) Last BM Date: 04/13/20  Intake/Output from previous day: 03/16 0701 - 03/17 0700 In: 360 [P.O.:360] Out: 8225 [Urine:8225] Intake/Output this shift: Total I/O In: 1062.6 [I.V.:848.8; IV Piggyback:213.8] Out: 1100 [Urine:1100]  PE: Gen: Alert, NAD, pleasant Heart: Tachycardic  Pulm: rate and effort normal Abd: Soft,ND,NT, +BS Psych: A&Ox3  Skin: no rashes noted, warm and dry   Lab Results:  Recent Labs    04/14/20 0713 04/15/20 0659  WBC 7.3 7.5  HGB 15.6 16.3  HCT 48.9 49.4  PLT 149* 175   BMET Recent Labs    04/14/20 1850 04/15/20 0659  NA 138 139  K 3.5 3.8  CL 94* 90*  CO2 33* 34*  GLUCOSE 140* 116*  BUN 24* 24*  CREATININE 1.28* 1.40*  CALCIUM 8.8* 8.8*   PT/INR Recent Labs    04/12/20 1552  LABPROT 21.2*  INR 1.9*   CMP     Component Value Date/Time   NA 139 04/15/2020 0659   K 3.8 04/15/2020 0659   CL 90 (L) 04/15/2020 0659   CO2 34 (H) 04/15/2020 0659   GLUCOSE 116 (H) 04/15/2020 0659   GLUCOSE 90 11/22/2005 1016   BUN 24 (H) 04/15/2020 0659   CREATININE 1.40 (H) 04/15/2020 0659   CALCIUM 8.8 (L) 04/15/2020 0659   PROT 6.8 04/15/2020 0659   ALBUMIN 2.7 (L) 04/15/2020 0659   ALBUMIN 2.6 (L) 04/15/2020 0659   AST 56 (H) 04/15/2020 0659   ALT 111 (H) 04/15/2020 0659   ALKPHOS 63 04/15/2020 0659   BILITOT 5.0 (H) 04/15/2020 0659   GFRNONAA >60 04/15/2020 0659   GFRAA 108 10/03/2007 1056   Lipase     Component Value Date/Time    LIPASE 165 (H) 04/15/2020 0659       Studies/Results: No results found.  Anti-infectives: Anti-infectives (From admission, onward)   Start     Dose/Rate Route Frequency Ordered Stop   04/12/20 1600  piperacillin-tazobactam (ZOSYN) IVPB 3.375 g        3.375 g 12.5 mL/hr over 240 Minutes Intravenous Every 8 hours 04/12/20 1446         Assessment/Plan HTN GERD  Gastric polyps s/p biopsies on EGD 04/09/20 Bilateral renal cysts - noted on CT 3/10 New HF with new EF <20%and LV dysfunction New LV thrombus R femoral vein DVT Tachycardiaand hypoxia require o2 - Defer to [facility] and cardiology -    Acutecholecystitis Pancreatitis - Lipase (165 from 142)  Intractable n/v  - HIDA positive for acute cholecystitis. He did not have stones on [place] but did have sludge - Given patients new HF with EF < 20%, DVT, LV thrombus cardiology feels hiscardiac status very tenuous and high risk forGBsurgery. Continue IV abx. If patient was to worsen, would likely need IR percutaneous cholecystectomy  - Continue to trend labs. We will follow with you  FEN -NPO (okay for [place] diet), IVF per primary VTE -SCDs, heparin ID -Zosyn 3/14 >>    Plan: Patients abdominal  pain, n/v have improved. He tolerated clear liquids yesterday. He is afebrile and WBC normal. LFTs improving except Tbili which continues to rise - may want to consider GI consult if Tbili remains elevated. Ok for [facility] diet after cardiac procedure.   LOS: 3 days    [facility] 04/15/2020, 12:13 PM Please see Amion for pager number during day hours 7:00am-4:30pm

## 2020-04-15 NOTE — Progress Notes (Addendum)
Advanced Heart Failure Team Rounding Note   Primary Physician: Kathlene November Primary Cardiologist:  Fransico Him  Reason for Consultation: Biventricular CHF  HPI:   3/15 coox 48%, started on milrinone and lasix gtt  Coox 64%, CVP 8,  weight down about 15lbs, cr slightly up at 1.4.  Reports continued improvement in breathing.  Still having nausea but no more vomiting.       Objective:    Vital Signs:   Temp:  [97.3 F (36.3 C)-98.2 F (36.8 C)] 97.3 F (36.3 C) (03/17 0731) Pulse Rate:  [120-128] 120 (03/17 0900) Resp:  [20-26] 26 (03/17 0900) BP: (119-133)/(84-98) 119/93 (03/17 0731) SpO2:  [92 %-95 %] 94 % (03/17 0900) Weight:  [127.2 kg] 127.2 kg (03/17 0623) Last BM Date: 04/13/20  Weight change: Filed Weights   04/11/20 1123 04/14/20 0622 04/15/20 0623  Weight: 135.2 kg 135.3 kg 127.2 kg    Intake/Output:   Intake/Output Summary (Last 24 hours) at 04/15/2020 1007 Last data filed at 04/15/2020 0900 Gross per 24 hour  Intake 1422.55 ml  Output 9325 ml  Net -7902.45 ml      Physical Exam    General:  Obese mail in no distress. No resp difficulty HEENT: normal Neck: supple. JVP difficult to assess . Carotids 2+ bilat; no bruits. No lymphadenopathy or thyromegaly appreciated. Cor: PMI nondisplaced. Tachycardic but regular rhythm. No rubs, gallops or murmurs. Lungs: clear Abdomen: soft, mildly tender to RUQ but no guarding.  Extremities: + for clubbing of fingers, no rash, R LE edema 2+, LLE edema trace Neuro: alert & orientedx3  Telemetry   Sinus tach ~120  EKG    No new ecg  Labs   Basic Metabolic Panel: Recent Labs  Lab 04/09/20 1301 04/11/20 1140 04/12/20 0027 04/13/20 0041 04/14/20 0712 04/14/20 1850 04/15/20 0659  NA 138 135 136 136 139 138 139  K 4.7 4.5 4.6 4.9 4.3 3.5 3.8  CL 105 102 105 102 101 94* 90*  CO2 23 21* 20* 26 27 33* 34*  GLUCOSE 119* 125* 120* 126* 114* 140* 116*  BUN 18 24* 23* 20 24* 24* 24*  CREATININE 1.14 1.12  0.91 1.26* 1.31* 1.28* 1.40*  CALCIUM 8.3* 8.6* 8.5* 8.6* 9.1 8.8* 8.8*  MG 2.0 2.0  --   --   --   --   --   PHOS  --   --   --   --  4.9*  --  5.2*    Liver Function Tests: Recent Labs  Lab 04/11/20 1140 04/12/20 0027 04/13/20 0041 04/14/20 0712 04/14/20 0713 04/15/20 0659  AST 120* 144* 104*  --  62* 56*  ALT 134* 172* 169*  --  132* 111*  ALKPHOS 64 59 59  --  61 63  BILITOT 3.9* 4.0* 4.5*  --  4.6* 5.0*  PROT 6.8 6.6 6.1*  --  6.4* 6.8  ALBUMIN 2.9* 2.9* 2.5* 2.6* 2.6* 2.7*  2.6*   Recent Labs  Lab 04/11/20 1841 04/12/20 0027 04/13/20 0041 04/14/20 0713 04/15/20 0659  LIPASE 135* 155* 201* 142* 165*  AMYLASE  --  159*  --   --   --    No results for input(s): AMMONIA in the last 168 hours.  CBC: Recent Labs  Lab 04/09/20 1301 04/11/20 1140 04/11/20 1841 04/12/20 0752 04/12/20 2046 04/13/20 1040 04/14/20 0713 04/15/20 0659  WBC 6.9 6.9  --   --   --  7.3 7.3 7.5  NEUTROABS 5.6 4.9  --   --   --   --   --   --  HGB 16.0 16.2   < > 15.8 16.3 15.5 15.6 16.3  HCT 50.8 50.2   < > 50.4 50.8 48.7 48.9 49.4  MCV 80.5 79.4*  --   --   --  79.4* 79.1* 77.4*  PLT 175 146*  --   --   --  108* 149* 175   < > = values in this interval not displayed.    Cardiac Enzymes: No results for input(s): CKTOTAL, CKMB, CKMBINDEX, TROPONINI in the last 168 hours.  BNP: BNP (last 3 results) Recent Labs    04/12/20 1216  BNP 1,153.9*    ProBNP (last 3 results) No results for input(s): PROBNP in the last 8760 hours.   CBG: No results for input(s): GLUCAP in the last 168 hours.  Coagulation Studies: Recent Labs    04/12/20 1552  LABPROT 21.2*  INR 1.9*     Imaging   No results found.   Medications:     Current Medications: . allopurinol  300 mg Oral Daily  . aspirin  81 mg Oral Pre-Cath  . buPROPion  100 mg Oral BID  . Chlorhexidine Gluconate Cloth  6 each Topical Daily  . digoxin  0.125 mg Oral Daily  . melatonin  5 mg Oral QHS  . morphine       . pantoprazole (PROTONIX) IV  40 mg Intravenous Q12H  . sodium chloride flush  10-40 mL Intracatheter Q12H  . sodium chloride flush  3 mL Intravenous Q12H  . spironolactone  12.5 mg Oral Daily  . sucralfate  1 g Oral TID WC & HS  . thiamine injection  100 mg Intravenous Daily    Infusions: . sodium chloride    . sodium chloride    . furosemide (LASIX) 200 mg in dextrose 5% 100 mL (2mg /mL) infusion 20 mg/hr (04/15/20 0742)  . heparin 2,300 Units/hr (04/15/20 0742)  . milrinone 0.125 mcg/kg/min (04/15/20 0742)  . piperacillin-tazobactam (ZOSYN)  IV 3.375 g (04/15/20 0145)  . promethazine (PHENERGAN) injection         Assessment/Plan   Systolic Biventricular CHF: -ECHO with large LV thromus, EF <20%, RV dysfunction, dilated LV, no real valve disease, + for RWMA, diastolic function not evaluated -do not suspect this is acute likely began months ago -NYHA class III symptoms -does have uncontrolled HTN and obesity with likely OSA but systolic dysfunction  out of proportion to degree of HTN -tested many times for covid and never positive, Flu neg, HIV negative not sure if a RVP would be useful this far out but will order -TSH normal -less likely amyloid or infiltrative disease with dilation and minimal hypertrophy, MM panel pending -no calcifications of coronaries on imaging, risk factors include obesity, HTN, early erectile dysfunction, RWMA on echo -with palpitation history may benefit from cardiac monitor going forward -Need to rule out sarcoid with clubbing, dilated cardiomyopathy and GI findings -needs outpatient sleep study likely has OSA  -started on milrinone and lasix gtt cvp 17>12>8  coox 64% , wt down around 15lbs continue diuresis -add spironolactone 12.5mg  today, next to add will be entresto -MRI and L/RHC today -follow coox, renal function  LV thrombus: -2.3 x 3.6 cm apical thrombus -currently on heparin, keep for now transition to oral anticoagulant  later  Tachycardia: -seems to be long standing since 2013 around 100-105 but worse during this admission due to decompensation  Acute Cholecystitis vs hepatic congestion: -conservative management with abx, surgery following -decongest   Acute RLE DVT: -on heparin  hemoptysis:  -  he describes mix of blood and sputum from coughing more so than related to his vomiting, this also correlates with the findings on recent EGD -low risk for TB  -may also have PE but could just be from coughing and anticoagulation, already on heparin  -this has all but resolved  Polycythemia:  -chronic likely due to chronic hypoxic state -testing epo and JAK mutations  Length of Stay: 3  Katherine Roan, MD  04/15/2020, 10:07 AM  Advanced Heart Failure Team Pager [phone] (M-F; 7a - 4p)  Please contact [facility] for night-coverage after hours (4p -7a ) and weekends on [email or web address]    Katherine Roan, MD  10:07 AM  Patient seen and examined with the above-signed Advanced Practice Provider and/or Housestaff. I personally reviewed laboratory data, imaging studies and relevant notes. I independently examined the patient and formulated the important aspects of the plan. I have edited the note to reflect any of my changes or salient points. I have personally discussed the plan with the patient and/or family.  Feeling some better. Diuresing well. cMRI LVEF 12% RVEF 21% No significant LGE. + LV clot. Cath with normal cors. Elevated filling pressures.   General:  Lying in bed  No resp difficulty HEENT: normal Neck: supple. JVP to jaw  Carotids 2+ bilat; no bruits. No lymphadenopathy or thryomegaly appreciated. Cor: PMI nondisplaced. Regular tachy + s3  Lungs: clear Abdomen: soft, nontender, nondistended. No hepatosplenomegaly. No bruits or masses. Good bowel sounds. Extremities: no cyanosis, clubbing, rash, trace edema Neuro: alert & orientedx3, cranial nerves grossly intact. moves all 4 extremities  w/o difficulty. Affect pleasant  cMRI and cath c/w severe NICM. Still very tenuous with prominent tachycardia and volume overload. Continue to diurese and titrate GDMT.   Glori Bickers, MD  3:50 PM

## 2020-04-15 NOTE — Progress Notes (Signed)
PROGRESS NOTE    Kevin Baxter  LKG:[id number] DOB: 1970/02/13 DOA: 04/11/2020 PCP: Colon Branch, MD    Chief Complaint  Patient presents with  . Nausea    Brief Narrative:  50 year old with past medical history significant for hypertension, gout, depression, morbid obesity, GERD, history of polyposis of his stomach and colonic polyps, report of esophageal dysphagia who has had abdominal pain, nausea and vomiting for several weeks.  He had a recent admission from 1/27 until 1/30 for similar complaints and treated for acute pancreatitis with lipase at 82, CT scan abdomen suggestive of acute pancreatitis.  Patient underwent further work-up for the same symptoms, underwent endoscopy on 04/09/2020 which did not reveal the cause of his dysphagia, after the endoscopy he had an episode of altered mental status and strokelike symptoms.  At that time in the ED he had an MRI that was negative for stroke and he was discharged home.  Patient continued to have persistent nausea and vomiting, and patient was advised to present to the ED for further evaluation.  In the ED he was found to be tachycardic, elevation of AST and ALT, elevated lipase, positive troponin.  Ultrasound abdomen showed gallbladder sludge.  Patient was admitted for further evaluation.  She was found to have new onset systolic cardiomyopathy, ejection fraction less than 20%, large apical thrombus, new right acute DVT, chronic cholecystitis and pancreatitis.  He was transferred to [facility] on for further evaluation by cardiology.  Patient seen by general surgery started on IV antibiotics for cholecystitis as due to current cardiac issues high risk for surgery at this time. -Heart failure team also consulted patient placed on amiodarone drip as well as Lasix drip.  Cardiac MRI ordered.  Patient for cardiac catheterization later on this week.    Assessment & Plan:   Principal Problem:   Intractable nausea and vomiting Active  Problems:   Essential hypertension   Morbid obesity (HCC)   Pancreatitis, acute   Weight loss, unintentional   Abnormal LFTs   Localized swelling of right lower extremity   Multiple gastric polyps   Poor responsiveness after MAC anesthesia 04/09/2020   Hematemesis with nausea   Mural thrombus of left ventricle   Left ventricular ejection fraction less than 20%   Acute acalculous cholecystitis   Mural thrombus of cardiac apex   Acute deep vein thrombosis (DVT) of right femoral vein (HCC)  1 acute cholecystitis Patient had presented with ongoing nausea and vomiting which was in tractable, dehydration and noted to have a lactic acidosis in the setting of dehydration. -Right upper quadrant ultrasound done consistent with gallbladder sludge. -HIDA scan done consistent with acute cholecystitis. -General surgery consulted and due to patient's cardiac issues recommended IV antibiotics at this time and if no significant improvement may need to go to a percutaneous cholecystostomy tube placement. -Improvement with nausea and vomiting. -LFTs trending down. -Patient stated unable to tolerate Reglan and as such Reglan has been discontinued.  -Patient tolerated clears and currently n.p.o. for cath today.   -Per general surgery may be placed on a heart healthy diet post-cath.   -Continue IV Zosyn.   -Per general surgery.   2.  Acute systolic biventricular CHF exacerbation -Patient noted to have increased O2 requirements early on during the hospitalization which have since improved.. -Patient seen in consultation by cardiology. -2D echo done with large left ventricular thrombus, EF < 20%, RV dysfunction with dilated LV, wall motion abnormalities. -TSH within normal limits. -COVID-19 PCR negative. -  Patient seen in consultation by heart failure team and patient started on milrinone drip and Lasix drip. -Still short of breath however slowly improving clinically. -Urine output of 8.225 L L over the  past 24 hours. -Patient is - 9.832 L during this hospitalization. -Current weight of 127.2 kg from 135.3 kg. -Continue digoxin. -Patient S/P cardiac MRI early on this morning with results pending. -Patient for cardiac catheterization later on today.  -Per heart failure team/cardiology.  3.  LV thrombus/right lower extremity DVT -Continue IV heparin -Likely transition to oral anticoagulation once no further procedures anticipated.  4.  Polycythemia Questionable etiology. -Labs for Jak mutation pending.  5.  Dysphagia/weight loss -Being followed in the outpatient setting by Dr. Carlean Purl.  Outpatient follow-up.  6.  Transaminitis/hyperbilirubinemia Likely secondary to acute cholecystitis versus CHF. -Acute hepatitis panel negative. -LFTs trending down.   -Follow.   7.  History of gout Allopurinol.   8.  Episode of altered mental status -After endoscopy 04/09/2020 likely related to anesthesia side effect.  MRI done at that time was negative. -Mental status improved and likely close to baseline.  9.  Intractable nausea and vomiting Likely secondary to problems #1 and 2. -Improved. -Reglan discontinued due to intolerance per patient.   -Supportive care.     DVT prophylaxis: Heparin Code Status: Full Family Communication: Updated patient, wife at bedside. Disposition:   Status is: Inpatient    Dispo: The patient is from: Home              Anticipated d/c is to: To be determined              Patient currently on Lasix drip, IV milrinone, IV antibiotics, probable cardiac catheterization later on this week.  Not stable for discharge.   Difficult to place patient no       Consultants:   Advanced heart failure team: Dr. Haroldine Laws 04/13/2020  Cardiology: Dr. Radford Pax 04/12/2020  Gastroenterology: Dr. Tarri Glenn 04/12/2020  General surgery: Dr. Johney Maine 04/13/2020  Procedures:   2D echo 04/12/2020  Lower extremity Dopplers 04/12/2020  Right upper quadrant ultrasound  04/11/2020  HIDA scan 04/12/2020  CT head 04/12/2020  Chest x-ray 04/12/2020  Cardiac MRI pending  Cardiac catheterization pending  Antimicrobials:   IV Zosyn 04/12/2020>>>>   Subjective: Patient states he feels like crap today.  Still with shortness of breath slowly improving.  Complain of palpitations.  Denies any abdominal pain.  No chest pain.  States tolerated clear liquids.  Denies any emesis.  Developed some nausea after oral potassium supplementation early on today per patient. Just came back from cardiac MRI and and awaiting cardiac catheterization later on this afternoon.    Objective: Vitals:   04/15/20 1000 04/15/20 1030 04/15/20 1100 04/15/20 1130  BP: 126/90 (!) 122/91 118/89 (!) 120/97  Pulse:  (!) 121  (!) 124  Resp:  (!) 22  (!) 22  Temp:    98 F (36.7 C)  TempSrc:    Oral  SpO2:    90%  Weight:      Height:        Intake/Output Summary (Last 24 hours) at 04/15/2020 1238 Last data filed at 04/15/2020 1229 Gross per 24 hour  Intake 1601.43 ml  Output 7100 ml  Net -5498.57 ml   Filed Weights   04/11/20 1123 04/14/20 0622 04/15/20 0623  Weight: 135.2 kg 135.3 kg 127.2 kg    Examination:  General exam: NAD Respiratory system: Diffuse crackles.  No wheezing.  No rhonchi.  Normal  respiratory effort.  Speaking in full sentences.  Diffuse crackles.  Cardiovascular system: Tachycardia.  Positive JVD.  No murmurs rubs or gallops.  1+ right lower extremity edema.  Trace left lower extremity edema. Gastrointestinal system: Abdomen is soft, nontender, nondistended, positive bowel sounds.  No rebound.  No guarding.  Central nervous system: Alert and oriented. No focal neurological deficits. Extremities: Symmetric 5 x 5 power. Skin: No rashes, lesions or ulcers Psychiatry: Judgement and insight appear normal. Mood & affect appropriate.     Data Reviewed: I have personally reviewed following labs and imaging studies  CBC: Recent Labs  Lab 04/09/20 1301  04/11/20 1140 04/11/20 1841 04/12/20 0752 04/12/20 2046 04/13/20 1040 04/14/20 0713 04/15/20 0659  WBC 6.9 6.9  --   --   --  7.3 7.3 7.5  NEUTROABS 5.6 4.9  --   --   --   --   --   --   HGB 16.0 16.2   < > 15.8 16.3 15.5 15.6 16.3  HCT 50.8 50.2   < > 50.4 50.8 48.7 48.9 49.4  MCV 80.5 79.4*  --   --   --  79.4* 79.1* 77.4*  PLT 175 146*  --   --   --  108* 149* 175   < > = values in this interval not displayed.    Basic Metabolic Panel: Recent Labs  Lab 04/09/20 1301 04/11/20 1140 04/12/20 0027 04/13/20 0041 04/14/20 0712 04/14/20 1850 04/15/20 0659  NA 138 135 136 136 139 138 139  K 4.7 4.5 4.6 4.9 4.3 3.5 3.8  CL 105 102 105 102 101 94* 90*  CO2 23 21* 20* 26 27 33* 34*  GLUCOSE 119* 125* 120* 126* 114* 140* 116*  BUN 18 24* 23* 20 24* 24* 24*  CREATININE 1.14 1.12 0.91 1.26* 1.31* 1.28* 1.40*  CALCIUM 8.3* 8.6* 8.5* 8.6* 9.1 8.8* 8.8*  MG 2.0 2.0  --   --   --   --   --   PHOS  --   --   --   --  4.9*  --  5.2*    GFR: Estimated Creatinine Clearance: 87.2 mL/min (A) (by C-G formula based on SCr of 1.4 mg/dL (H)).  Liver Function Tests: Recent Labs  Lab 04/11/20 1140 04/12/20 0027 04/13/20 0041 04/14/20 0712 04/14/20 0713 04/15/20 0659  AST 120* 144* 104*  --  62* 56*  ALT 134* 172* 169*  --  132* 111*  ALKPHOS 64 59 59  --  61 63  BILITOT 3.9* 4.0* 4.5*  --  4.6* 5.0*  PROT 6.8 6.6 6.1*  --  6.4* 6.8  ALBUMIN 2.9* 2.9* 2.5* 2.6* 2.6* 2.7*  2.6*    CBG: No results for input(s): GLUCAP in the last 168 hours.   Recent Results (from the past 240 hour(s))  Resp Panel by RT-PCR (Flu A&B, Covid) Nasopharyngeal Swab     Status: None   Collection Time: 04/11/20 11:40 AM   Specimen: Nasopharyngeal Swab; Nasopharyngeal(NP) swabs in vial transport medium  Result Value Ref Range Status   SARS Coronavirus 2 by RT PCR NEGATIVE NEGATIVE Final    Comment: (NOTE) SARS-CoV-2 target nucleic acids are NOT DETECTED.  The SARS-CoV-2 RNA is generally detectable  in upper respiratory specimens during the acute phase of infection. The lowest concentration of SARS-CoV-2 viral copies this assay can detect is 138 copies/mL. A negative result does not preclude SARS-Cov-2 infection and should not be used as the sole basis for treatment  or other patient management decisions. A negative result may occur with  improper specimen collection/handling, submission of specimen other than nasopharyngeal swab, presence of viral mutation(s) within the areas targeted by this assay, and inadequate number of viral copies(<138 copies/mL). A negative result must be combined with clinical observations, patient history, and epidemiological information. The expected result is Negative.  Fact Sheet for Patients:  EntrepreneurPulse.com.au  Fact Sheet for Healthcare Providers:  IncredibleEmployment.be  This test is no t yet approved or cleared by the [place] FDA and  has been authorized for detection and/or diagnosis of SARS-CoV-2 by FDA under an Emergency Use Authorization (EUA). This EUA will remain  in effect (meaning this test can be used) for the duration of the COVID-19 declaration under Section 564(b)(1) of the Act, 21 U.S.C.section 360bbb-3(b)(1), unless the authorization is terminated  or revoked sooner.       Influenza A by PCR NEGATIVE NEGATIVE Final   Influenza B by PCR NEGATIVE NEGATIVE Final    Comment: (NOTE) The Xpert Xpress SARS-CoV-2/FLU/RSV plus assay is intended as an aid in the diagnosis of influenza from Nasopharyngeal swab specimens and should not be used as a sole basis for treatment. Nasal washings and aspirates are unacceptable for Xpert Xpress SARS-CoV-2/FLU/RSV testing.  Fact Sheet for Patients: EntrepreneurPulse.com.au  Fact Sheet for Healthcare Providers: IncredibleEmployment.be  This test is not yet approved or cleared by the [place] FDA and has  been authorized for detection and/or diagnosis of SARS-CoV-2 by FDA under an Emergency Use Authorization (EUA). This EUA will remain in effect (meaning this test can be used) for the duration of the COVID-19 declaration under Section 564(b)(1) of the Act, 21 U.S.C. section 360bbb-3(b)(1), unless the authorization is terminated or revoked.  Performed at [facility], [address] [place], [place] [address]          Radiology Studies: No results found.      Scheduled Meds: . allopurinol  300 mg Oral Daily  . buPROPion  100 mg Oral BID  . Chlorhexidine Gluconate Cloth  6 each Topical Daily  . digoxin  0.125 mg Oral Daily  . feeding supplement  1 Container Oral TID BM  . melatonin  5 mg Oral QHS  . morphine      . pantoprazole (PROTONIX) IV  40 mg Intravenous Q12H  . sodium chloride flush  10-40 mL Intracatheter Q12H  . sodium chloride flush  3 mL Intravenous Q12H  . spironolactone  12.5 mg Oral Daily  . sucralfate  1 g Oral TID WC & HS  . thiamine injection  100 mg Intravenous Daily   Continuous Infusions: . sodium chloride    . sodium chloride    . furosemide (LASIX) 200 mg in dextrose 5% 100 mL (65m/mL) infusion 20 mg/hr (04/15/20 1229)  . heparin 2,300 Units/hr (04/15/20 1229)  . milrinone 0.125 mcg/kg/min (04/15/20 1229)  . piperacillin-tazobactam (ZOSYN)  IV 3.375 g (04/15/20 1144)  . promethazine (PHENERGAN) injection       LOS: 3 days    Time spent: 40 minutes    DIrine Seal MD [facility]   To contact the attending provider between 7A-7P or the covering provider during after hours 7P-7A, please log into the web site [email or web address] and access using universal [place] password for that web site. If you do not have the password, please call the hospital operator.  04/15/2020, 12:38 PM

## 2020-04-15 NOTE — Interval H&P Note (Signed)
History and Physical Interval Note:  04/15/2020 3:14 PM  Kevin Baxter  has presented today for surgery, with the diagnosis of heart failure.  The various methods of treatment have been discussed with the patient and family. After consideration of risks, benefits and other options for treatment, the patient has consented to  Procedure(s): RIGHT HEART CATH AND CORONARY ANGIOGRAPHY (N/A) and possible coronary angioplasty as a surgical intervention.  The patient's history has been reviewed, patient examined, no change in status, stable for surgery.  I have reviewed the patient's chart and labs.  Questions were answered to the patient's satisfaction.     [name]

## 2020-04-15 NOTE — Progress Notes (Signed)
[place] for Heparin Indication: DVT and apical thrombus  Allergies  Allergen Reactions  . Pseudoephedrine     REACTION: jittery    Patient Measurements: Height: 6\' 1"  (185.4 cm) Weight: 127.2 kg (280 lb 8 oz) IBW/kg (Calculated) : 79.9 Heparin Dosing Weight: 110 kg  Vital Signs: Temp: 97.3 F (36.3 C) (03/17 0731) Temp Source: Oral (03/17 0731) BP: 119/93 (03/17 0731) Pulse Rate: 125 (03/17 0731)  Labs: Recent Labs    04/12/20 1552 04/12/20 2046 04/13/20 1040 04/13/20 1250 04/14/20 0712 04/14/20 0713 04/14/20 0714 04/14/20 1253 04/14/20 1850 04/15/20 0659 04/15/20 0700  HGB  --    < > 15.5  --   --  15.6  --   --   --  16.3  --   HCT  --    < > 48.7  --   --  48.9  --   --   --  49.4  --   PLT  --   --  108*  --   --  149*  --   --   --  175  --   APTT 35  --   --   --   --   --   --   --   --   --   --   LABPROT 21.2*  --   --   --   --   --   --   --   --   --   --   INR 1.9*  --   --   --   --   --   --   --   --   --   --   HEPARINUNFRC  --    < >  --    < >  --   --  0.60 0.56  --   --  0.37  CREATININE  --    < >  --   --  1.31*  --   --   --  1.28* 1.40*  --    < > = values in this interval not displayed.    Estimated Creatinine Clearance: 87.2 mL/min (A) (by C-G formula based on SCr of 1.4 mg/dL (H)).  Assessment: 50 y.o. male with RLE DVT and apical thrombus on IV heparin. Heparin level remains therapeutic at 0.37, on drip rate 2300 units/hr. CBC stable. No overt bleeding or infusion issues noted. Plans noted for [facility] this afternoon.  Goal of Therapy:  Heparin level 0.3-0.7 units/ml Monitor platelets by anticoagulation protocol: Yes   Plan:  Continue IV heparin at 2300 units/hr. Daily HL, CBC, s/sx bleeding F/u plan for oral anticoagulation eventually.  Richardine Service, PharmD, BCPS PGY2 Cardiology Pharmacy Resident Phone: [phone] 04/15/2020  8:11 AM  Please check [email or web address] for unit-specific  pharmacy phone numbers.

## 2020-04-15 NOTE — Progress Notes (Signed)
[facility] for Heparin Indication: DVT and apical thrombus  Allergies  Allergen Reactions  . Pseudoephedrine     REACTION: jittery    Patient Measurements: Height: 6\' 1"  (185.4 cm) Weight: 127.2 kg (280 lb 8 oz) IBW/kg (Calculated) : 79.9 Heparin Dosing Weight: 110 kg  Vital Signs: Temp: 98.4 F (36.9 C) (03/17 1600) Temp Source: Oral (03/17 1600) BP: 138/97 (03/17 1600) Pulse Rate: 125 (03/17 1600)  Labs: Recent Labs    04/13/20 1040 04/13/20 1250 04/14/20 0712 04/14/20 0713 04/14/20 0714 04/14/20 1253 04/14/20 1850 04/15/20 0659 04/15/20 0700  HGB 15.5  --   --  15.6  --   --   --  16.3  --   HCT 48.7  --   --  48.9  --   --   --  49.4  --   PLT 108*  --   --  149*  --   --   --  175  --   HEPARINUNFRC  --    < >  --   --  0.60 0.56  --   --  0.37  CREATININE  --   --  1.31*  --   --   --  1.28* 1.40*  --    < > = values in this interval not displayed.    Estimated Creatinine Clearance: 87.2 mL/min (A) (by C-G formula based on SCr of 1.4 mg/dL (H)).  Assessment: 50 y.o. male with RLE DVT and apical thrombus on IV heparin. Heparin level previously therapeutic on drip rate 2300 units/hr prior to stopping for cath this afternoon. 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  Pharmacy consulted to resume heparin 4hrs post-sheath removal. Sheath removed at 1542 per cath procedure log. CBC stable. No overt bleeding or infusion issues noted.   Goal of Therapy:  Heparin level 0.3-0.7 units/ml Monitor platelets by anticoagulation protocol: Yes   Plan:  No bolus. Resume IV heparin at previous therapeutic rate 2300 units/hr at 1945 (4hrs post-sheath removal) Check 6hr heparin level from resumption Monitor daily CBC, s/sx bleeding F/u plan for oral anticoagulation as appropriate   Arturo Morton, PharmD, BCPS Please  check AMION for all Central contact numbers Clinical Pharmacist 04/15/2020 4:20 PM

## 2020-04-15 NOTE — Progress Notes (Signed)
Called about Kevin Baxter having chest pain. 6/10.  EKG with ST 130. sp02 93%, BP stable 133/102.  Pt states he cannot get comfortable - began prior to cath.  Feels like the bed is not right.    Discussed with Dr. Haroldine Laws.    Cardiac cath with normal coronary arteries. EF 15%.  General:Pleasant affect, NAD Skin:Warm and dry, brisk capillary refill HEENT:normocephalic, sclera clear, mucus membranes moist Neck:supple, no JVD, no bruits  Heart:S1S2 RRR without murmur, gallup, rub or click Lungs:clear with rales, no rhonchi, or wheezes PFY:TWKM, non tender, + BS, do not palpate liver spleen or masses Ext:no lower ext edema, 2+ pedal pulses, 2+ radial pulses Neuro:alert and oriented X 3, MAE, follows commands, + facial symmetry  Will give 2 mg morphine now but if no improvement, will check PCXR now.  Will get CTA of chest to eval for PE with DVT and LV thrombus.

## 2020-04-15 NOTE — Progress Notes (Signed)
Patient taken to MRI via bed with transport and Dione Plover.

## 2020-04-15 NOTE — Progress Notes (Signed)
Patient taken to cath lab at 1445hrs.

## 2020-04-15 NOTE — Progress Notes (Signed)
Patient returned from MRI at 1130hrs.

## 2020-04-15 NOTE — Progress Notes (Signed)
c0-ox is improved, CXR stable and after morphine his discomfort is improved.  Does not feel like gallbladder pain.  He will notify nurse if recurrence of pain.

## 2020-04-15 NOTE — Progress Notes (Signed)
Patient moving in bed, c/o CP 6/10, center of chest, described as sharp, HR 128-130, ST, BP 133/102 on Left leg. Pressure being done on leg due to brachial site right arm and PICC left arm.  EKG being done. Given one NTG SL with no change in pain level.  NP [facility] paged, will come see patient.

## 2020-04-16 ENCOUNTER — Inpatient Hospital Stay (HOSPITAL_COMMUNITY): Payer: 59

## 2020-04-16 ENCOUNTER — Inpatient Hospital Stay (HOSPITAL_COMMUNITY): Payer: 59 | Admitting: Registered Nurse

## 2020-04-16 ENCOUNTER — Encounter (HOSPITAL_COMMUNITY): Payer: Self-pay | Admitting: Internal Medicine

## 2020-04-16 ENCOUNTER — Encounter (HOSPITAL_COMMUNITY)
Admission: EM | Disposition: A | Discharge status: Home / Self Care | Payer: Self-pay | Source: Home / Self Care | Attending: Internal Medicine

## 2020-04-16 DIAGNOSIS — I82411 Acute embolism and thrombosis of right femoral vein: Secondary | ICD-10-CM

## 2020-04-16 DIAGNOSIS — I2602 Saddle embolus of pulmonary artery with acute cor pulmonale: Secondary | ICD-10-CM | POA: Diagnosis not present

## 2020-04-16 DIAGNOSIS — I509 Heart failure, unspecified: Secondary | ICD-10-CM

## 2020-04-16 DIAGNOSIS — R079 Chest pain, unspecified: Secondary | ICD-10-CM

## 2020-04-16 DIAGNOSIS — I5021 Acute systolic (congestive) heart failure: Secondary | ICD-10-CM | POA: Diagnosis not present

## 2020-04-16 DIAGNOSIS — I5082 Biventricular heart failure: Secondary | ICD-10-CM | POA: Diagnosis not present

## 2020-04-16 DIAGNOSIS — I2782 Chronic pulmonary embolism: Secondary | ICD-10-CM

## 2020-04-16 HISTORY — PX: IR US GUIDE VASC ACCESS LEFT: IMG2389

## 2020-04-16 HISTORY — PX: IR THROMBECT PRIM MECH INIT (INCLU) MOD SED: IMG2297

## 2020-04-16 HISTORY — PX: IR US GUIDE VASC ACCESS RIGHT: IMG2390

## 2020-04-16 HISTORY — PX: IR ANGIOGRAM PULMONARY BILATERAL SELECTIVE: IMG664

## 2020-04-16 HISTORY — PX: IR ANGIOGRAM SELECTIVE EACH ADDITIONAL VESSEL: IMG667

## 2020-04-16 HISTORY — PX: RADIOLOGY WITH ANESTHESIA: SHX6223

## 2020-04-16 LAB — HEPATIC FUNCTION PANEL
ALT: 92 U/L — ABNORMAL HIGH (ref 0–44)
AST: 52 U/L — ABNORMAL HIGH (ref 15–41)
Albumin: 2.6 g/dL — ABNORMAL LOW (ref 3.5–5.0)
Alkaline Phosphatase: 65 U/L (ref 38–126)
Bilirubin, Direct: 2.3 mg/dL — ABNORMAL HIGH (ref 0.0–0.2)
Indirect Bilirubin: 2.7 mg/dL — ABNORMAL HIGH (ref 0.3–0.9)
Total Bilirubin: 5 mg/dL — ABNORMAL HIGH (ref 0.3–1.2)
Total Protein: 7 g/dL (ref 6.5–8.1)

## 2020-04-16 LAB — POCT I-STAT 7, (LYTES, BLD GAS, ICA,H+H)
Acid-Base Excess: 12 mmol/L — ABNORMAL HIGH (ref 0.0–2.0)
Acid-Base Excess: 12 mmol/L — ABNORMAL HIGH (ref 0.0–2.0)
Bicarbonate: 38.3 mmol/L — ABNORMAL HIGH (ref 20.0–28.0)
Bicarbonate: 37.8 mmol/L — ABNORMAL HIGH (ref 20.0–28.0)
Calcium, Ion: 1.15 mmol/L (ref 1.15–1.40)
Calcium, Ion: 1.11 mmol/L — ABNORMAL LOW (ref 1.15–1.40)
HCT: 54 % — ABNORMAL HIGH (ref 39.0–52.0)
HCT: 49 % (ref 39.0–52.0)
Hemoglobin: 18.4 g/dL — ABNORMAL HIGH (ref 13.0–17.0)
Hemoglobin: 16.7 g/dL (ref 13.0–17.0)
O2 Saturation: 94 %
O2 Saturation: 90 %
Patient temperature: 36.8
Potassium: 3.7 mmol/L (ref 3.5–5.1)
Potassium: 3.6 mmol/L (ref 3.5–5.1)
Sodium: 137 mmol/L (ref 135–145)
Sodium: 138 mmol/L (ref 135–145)
TCO2: 40 mmol/L — ABNORMAL HIGH (ref 22–32)
TCO2: 39 mmol/L — ABNORMAL HIGH (ref 22–32)
pCO2 arterial: 52 mmHg — ABNORMAL HIGH (ref 32.0–48.0)
pCO2 arterial: 51.2 mmHg — ABNORMAL HIGH (ref 32.0–48.0)
pH, Arterial: 7.474 — ABNORMAL HIGH (ref 7.350–7.450)
pH, Arterial: 7.476 — ABNORMAL HIGH (ref 7.350–7.450)
pO2, Arterial: 66 mmHg — ABNORMAL LOW (ref 83.0–108.0)
pO2, Arterial: 57 mmHg — ABNORMAL LOW (ref 83.0–108.0)

## 2020-04-16 LAB — RENAL FUNCTION PANEL
Albumin: 2.6 g/dL — ABNORMAL LOW (ref 3.5–5.0)
Anion gap: 12 (ref 5–15)
BUN: 27 mg/dL — ABNORMAL HIGH (ref 6–20)
CO2: 35 mmol/L — ABNORMAL HIGH (ref 22–32)
Calcium: 9 mg/dL (ref 8.9–10.3)
Chloride: 89 mmol/L — ABNORMAL LOW (ref 98–111)
Creatinine, Ser: 1.4 mg/dL — ABNORMAL HIGH (ref 0.61–1.24)
GFR, Estimated: >60 mL/min (ref >60)
Glucose, Bld: 134 mg/dL — ABNORMAL HIGH (ref 70–99)
Phosphorus: 4.5 mg/dL (ref 2.5–4.6)
Potassium: 3.9 mmol/L (ref 3.5–5.1)
Sodium: 136 mmol/L (ref 135–145)

## 2020-04-16 LAB — ABO/RH: ABO/RH(D): O POS

## 2020-04-16 LAB — LIPASE, BLOOD: Lipase: 152 U/L — ABNORMAL HIGH (ref 11–51)

## 2020-04-16 LAB — CBC
HCT: 51.7 % (ref 39.0–52.0)
Hemoglobin: 16.7 g/dL (ref 13.0–17.0)
MCH: 25.3 pg — ABNORMAL LOW (ref 26.0–34.0)
MCHC: 32.3 g/dL (ref 30.0–36.0)
MCV: 78.3 fL — ABNORMAL LOW (ref 80.0–100.0)
Platelets: 189 10*3/uL (ref 150–400)
RBC: 6.6 MIL/uL — ABNORMAL HIGH (ref 4.22–5.81)
RDW: 17.6 % — ABNORMAL HIGH (ref 11.5–15.5)
WBC: 8.1 10*3/uL (ref 4.0–10.5)
nRBC: 0.2 % (ref 0.0–0.2)

## 2020-04-16 LAB — COOXEMETRY PANEL
Carboxyhemoglobin: 1.2 % (ref 0.5–1.5)
Methemoglobin: 0.9 % (ref 0.0–1.5)
O2 Saturation: 69 %
Total hemoglobin: 17 g/dL — ABNORMAL HIGH (ref 12.0–16.0)

## 2020-04-16 LAB — PROTEIN ELECTROPHORESIS, SERUM
A/G Ratio: 0.8 (ref 0.7–1.7)
Albumin ELP: 3 g/dL (ref 2.9–4.4)
Alpha-1-Globulin: 0.5 g/dL — ABNORMAL HIGH (ref 0.0–0.4)
Alpha-2-Globulin: 0.8 g/dL (ref 0.4–1.0)
Beta Globulin: 1.4 g/dL — ABNORMAL HIGH (ref 0.7–1.3)
Gamma Globulin: 1.1 g/dL (ref 0.4–1.8)
Globulin, Total: 3.8 g/dL (ref 2.2–3.9)
Total Protein ELP: 6.8 g/dL (ref 6.0–8.5)

## 2020-04-16 LAB — PREPARE RBC (CROSSMATCH)

## 2020-04-16 LAB — PROTIME-INR
INR: 1.6 — ABNORMAL HIGH (ref 0.8–1.2)
Prothrombin Time: 18.5 seconds — ABNORMAL HIGH (ref 11.4–15.2)

## 2020-04-16 LAB — HEPARIN LEVEL (UNFRACTIONATED)
Heparin Unfractionated: 0.41 IU/mL (ref 0.30–0.70)
Heparin Unfractionated: 0.24 IU/mL — ABNORMAL LOW (ref 0.30–0.70)
Heparin Unfractionated: 0.69 IU/mL (ref 0.30–0.70)

## 2020-04-16 LAB — BRAIN NATRIURETIC PEPTIDE: B Natriuretic Peptide: 885.4 pg/mL — ABNORMAL HIGH (ref 0.0–100.0)

## 2020-04-16 LAB — TROPONIN I (HIGH SENSITIVITY)
Troponin I (High Sensitivity): 60 ng/L — ABNORMAL HIGH (ref <18)
Troponin I (High Sensitivity): 98 ng/L — ABNORMAL HIGH (ref <18)

## 2020-04-16 IMAGING — XA IR ANGIO/PULMON/BI
9 of 19 series · 9 of 24 positions shown · IV contrast (IODINE)
Comparison: CT a chest from [DATE]
COMPARISON: CT a chest from [DATE]

Addendum:
INDICATION: 49-year-old male with history of intermediate high risk (PESI class
4) acute pulmonary embolism in the setting of chronic heart failure
and right lower extremity deep vein thrombosis.
TECHNIQUE: Informed written consent was obtained from the patient after a
thorough discussion of the procedural risks, benefits and
alternatives. All questions were addressed. Maximal Sterile Barrier
Technique was utilized including caps, mask, sterile gowns, sterile
gloves, sterile drape, hand hygiene and skin antiseptic. A timeout
was performed prior to the initiation of the procedure.

[Series 2: body 4 care · 1 of 14 slices shown (1 of 5)]
[im 1/14]
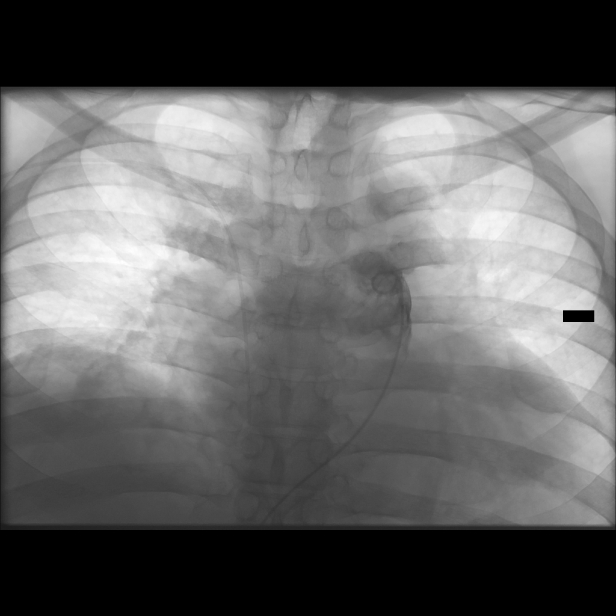

[Series 6: body 4 care · 1 of 11 slices shown (2 of 5)]
[im 1/11]
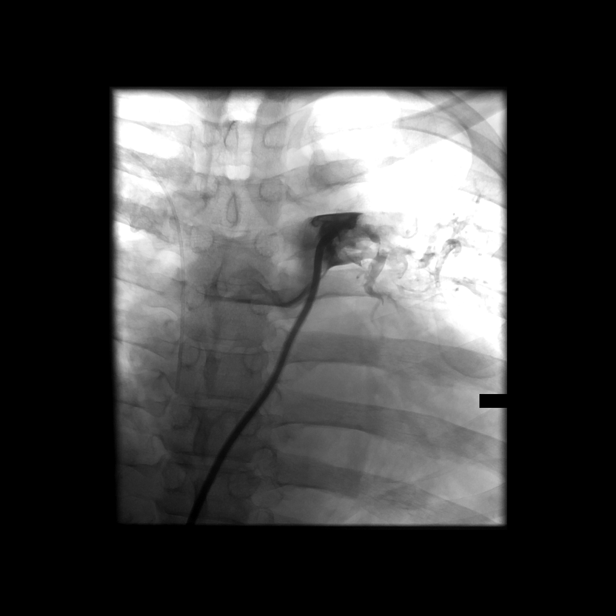

[Series 8: fl (-) angio · 1 of 1 slices shown (1 of 4)]
[im 1/1]
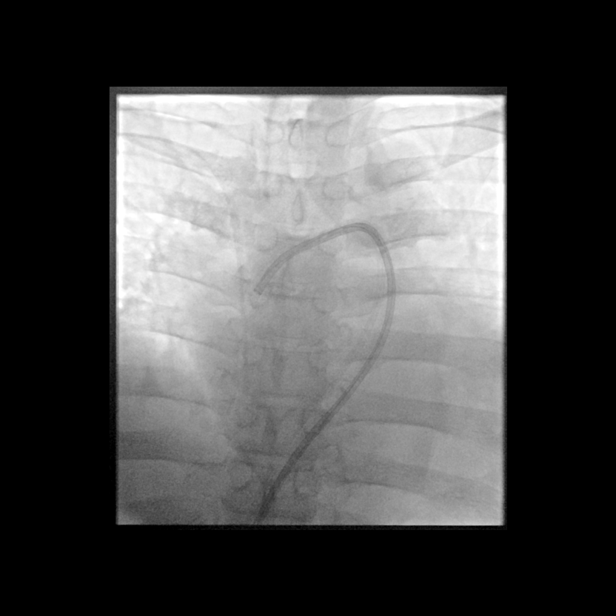

[Series 9: fl (-) angio · 1 of 29 slices shown (2 of 4)]
[im 19/29]
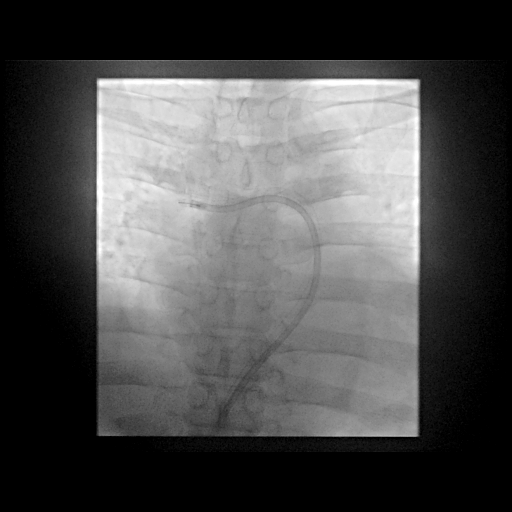

[Series 11: body 4 care · 1 of 17 slices shown (3 of 5)]
[im 1/17]
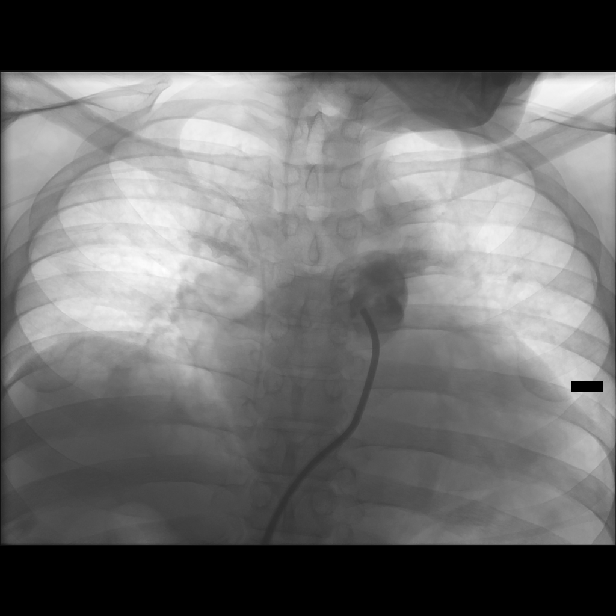

[Series 12: fl (-) angio · 1 of 1 slices shown (3 of 4)]
[im 1/1]
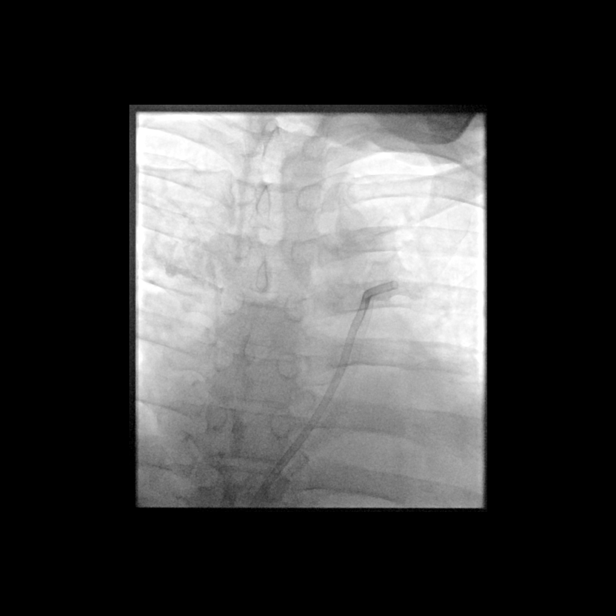

[Series 16: fl (-) angio · 1 of 4 slices shown (4 of 4)]
[im 1/4]
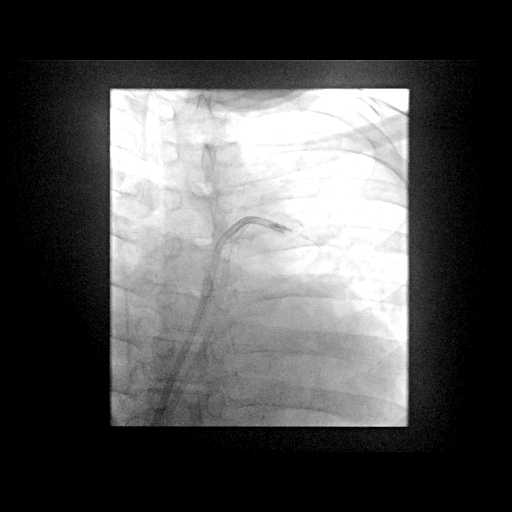

[Series 20: body 4 care · 1 of 2 slices shown (4 of 5)]
[im 1/2]
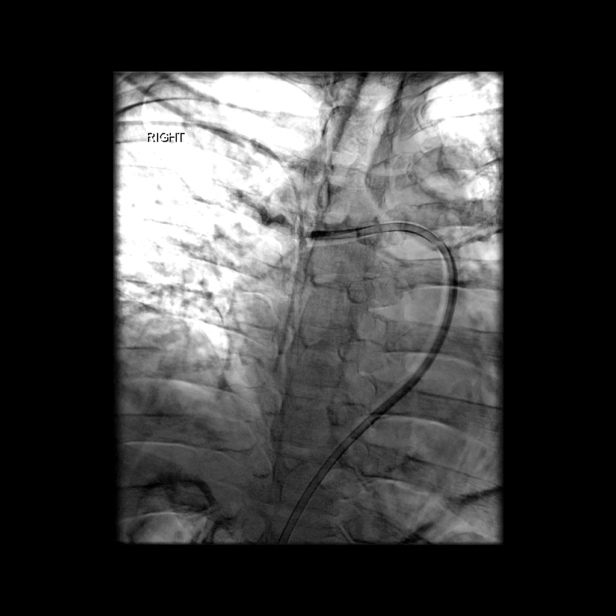

[Series 22: body 4 care · 1 of 10 slices shown (5 of 5)]
[im 1/10]
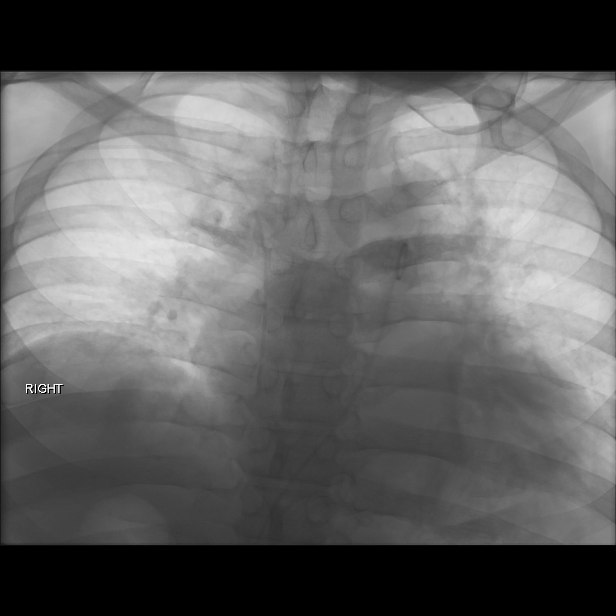

[9 of 24 positions shown; findings below may reference images not displayed]

EXAM:
1. Ultrasound-guided left common femoral artery access.
2. Ultrasound-guided left common femoral vein access.
3. Ultrasound-guided right common femoral vein access.
4. Pulmonary angiogram.
5. Pulmonary manometry.
6. Selective catheterization of the bilateral pulmonary arteries.
7. Mechanical thrombectomy of the bilateral pulmonary arteries.
MEDICATIONS:
None.

ANESTHESIA/SEDATION:
The procedure was performed under monitored anesthesia care by the
[REDACTED]. Please refer to anesthesia record for
medication administration details.

FLUOROSCOPY TIME:  Fluoroscopy Time: 40 minutes 24 seconds (757
mGy).

COMPLICATIONS:
None immediate.
The bilateral groins were prepped and draped in standard fashion.
Given propensity of clinical decompensation, request was made for
vascular access in the setting of possible ECMO cannulation.
Therefore, the left common femoral artery and common femoral vein
were evaluated with ultrasound. Subdermal Local anesthesia was
administered with 1% lidocaine at the planned needle entry sites.
Small skin nicks were made with an 11 blade. Under direct ultrasound
visualization, the left common femoral artery was punctured with a
21 gauge micropuncture set. A [DATE] French slender sheath was then
placed, and this sheath was used for arterial monitoring during the
procedure. Next, the left common femoral vein was punctured under
direct ultrasound visualization. A [DATE] French slender sheath was
then placed.

Attention was then turned to the right groin. Ultrasound evaluation
demonstrated a patent right common femoral vein with
compressibility. The procedure was planned. Subdermal Local
anesthesia was administered at the planned needle entry site. A
small skin nick was made. Under direct ultrasound visualization, a
21 gauge micropuncture needle was used to puncture the right common
femoral vein. The micropuncture set was exchanged over a J wire for
a 5 French, 11 cm vascular sheath. The wire and a pigtail catheter
were directed to the perihepatic inferior vena cava. The wire was
removed and exchanged for a tip deflecting wire. Under fluoroscopic
guidance, the tip deflecting wire and pigtail catheter were guided
through the right atrium and right ventricle into the main pulmonary
artery without difficulty. Pulmonary artery pressures were then
measured (69/33, mean 45 mmHg). Pulmonary angiogram was performed
which demonstrated large filling defects in the bilateral main
pulmonary arteries with limited peripheral perfusion. The left
pulmonary artery was then selected with an Amplatz wire. The pigtail
catheter and 5 French sheath were removed. Serial dilation was
performed and a 12 French, 35 cm dry seal sheath was placed. Through
the sheath, the Penumbra CAT 12 thrombectomy catheter was directed
into the proximal left pulmonary artery. Mechanical suction
thrombectomy was performed using the separator. Multiple passes were
made targeting the upper and lower lobe segmental branches. The
thrombectomy catheter was removed and flushed. Repeat left-sided
pulmonary angiogram was performed with the pigtail catheter which
demonstrated significantly improved clot burden and perfusion of the
left lung.

Attention was then turned toward the right pulmonary artery. A stiff
Glidewire was used to select the right inferior lobar pulmonary
artery, over which the CAT 12 thrombectomy catheter was advanced.
Multiple passes performing section thrombectomy were performed using
the separator. The thrombectomy catheter was removed in flush.
Repeat right-sided pulmonary angiogram was performed with the
pigtail catheters demonstrated significant improved clot burden of
perfusion to the right lung. The pigtail catheter was retracted into
the main pulmonary artery and repeat pressures were obtained (50/33,
mean 38 mmHg). Completion pulmonary angiogram demonstrated some
residual bilateral lobar and segmental clot burden, however
significantly improved from comparison with overall significantly
improved bilateral pulmonary arterial perfusion.

At this point, the patient noted to breathing easier. The catheters
and sheaths were removed and manual compression was applied
bilaterally without complication. The patient was transferred to the
intensive care unit in stable condition.
FINDINGS: Bilateral pulmonary emboli, mostly occlusive with associated
pulmonary hypertension.
IMPRESSION: 1. Bilateral main pulmonary emboli with associated pulmonary
arterial hypertension.
2. Technically successful mechanical thrombectomy of the bilateral
pulmonary arteries with reduction in pulmonary arterial
hypertension.
3. Technically successful temporary left common femoral artery and
common femoral vein vascular access for potential ECMO cannulation.
These accesses were removed upon completion of the case as ECMO was
not required.

ADDENDUM:
Ultrasound evaluation of the bilateral common femoral veins and left
common femoral arteries demonstrated patency of each vessel. An
image of each accessed vessel was obtained and stored in the
permanent imaging record.

*** End of Addendum ***
EXAM:
1. Ultrasound-guided left common femoral artery access.
2. Ultrasound-guided left common femoral vein access.
3. Ultrasound-guided right common femoral vein access.
4. Pulmonary angiogram.
5. Pulmonary manometry.
6. Selective catheterization of the bilateral pulmonary arteries.
7. Mechanical thrombectomy of the bilateral pulmonary arteries.
MEDICATIONS:
None.

ANESTHESIA/SEDATION:
The procedure was performed under monitored anesthesia care by the
[REDACTED]. Please refer to anesthesia record for
medication administration details.

FLUOROSCOPY TIME:  Fluoroscopy Time: 40 minutes 24 seconds (757
mGy).

COMPLICATIONS:
None immediate.
The bilateral groins were prepped and draped in standard fashion.
Given propensity of clinical decompensation, request was made for
vascular access in the setting of possible ECMO cannulation.
Therefore, the left common femoral artery and common femoral vein
were evaluated with ultrasound. Subdermal Local anesthesia was
administered with 1% lidocaine at the planned needle entry sites.
Small skin nicks were made with an 11 blade. Under direct ultrasound
visualization, the left common femoral artery was punctured with a
21 gauge micropuncture set. A [DATE] French slender sheath was then
placed, and this sheath was used for arterial monitoring during the
procedure. Next, the left common femoral vein was punctured under
direct ultrasound visualization. A [DATE] French slender sheath was
then placed.

Attention was then turned to the right groin. Ultrasound evaluation
demonstrated a patent right common femoral vein with
compressibility. The procedure was planned. Subdermal Local
anesthesia was administered at the planned needle entry site. A
small skin nick was made. Under direct ultrasound visualization, a
21 gauge micropuncture needle was used to puncture the right common
femoral vein. The micropuncture set was exchanged over a J wire for
a 5 French, 11 cm vascular sheath. The wire and a pigtail catheter
were directed to the perihepatic inferior vena cava. The wire was
removed and exchanged for a tip deflecting wire. Under fluoroscopic
guidance, the tip deflecting wire and pigtail catheter were guided
through the right atrium and right ventricle into the main pulmonary
artery without difficulty. Pulmonary artery pressures were then
measured (69/33, mean 45 mmHg). Pulmonary angiogram was performed
which demonstrated large filling defects in the bilateral main
pulmonary arteries with limited peripheral perfusion. The left
pulmonary artery was then selected with an Amplatz wire. The pigtail
catheter and 5 French sheath were removed. Serial dilation was
performed and a 12 French, 35 cm dry seal sheath was placed. Through
the sheath, the Penumbra CAT 12 thrombectomy catheter was directed
into the proximal left pulmonary artery. Mechanical suction
thrombectomy was performed using the separator. Multiple passes were
made targeting the upper and lower lobe segmental branches. The
thrombectomy catheter was removed and flushed. Repeat left-sided
pulmonary angiogram was performed with the pigtail catheter which
demonstrated significantly improved clot burden and perfusion of the
left lung.

Attention was then turned toward the right pulmonary artery. A stiff
Glidewire was used to select the right inferior lobar pulmonary
artery, over which the CAT 12 thrombectomy catheter was advanced.
Multiple passes performing section thrombectomy were performed using
the separator. The thrombectomy catheter was removed in flush.
Repeat right-sided pulmonary angiogram was performed with the
pigtail catheters demonstrated significant improved clot burden of
perfusion to the right lung. The pigtail catheter was retracted into
the main pulmonary artery and repeat pressures were obtained (50/33,
mean 38 mmHg). Completion pulmonary angiogram demonstrated some
residual bilateral lobar and segmental clot burden, however
significantly improved from comparison with overall significantly
improved bilateral pulmonary arterial perfusion.

At this point, the patient noted to breathing easier. The catheters
and sheaths were removed and manual compression was applied
bilaterally without complication. The patient was transferred to the
intensive care unit in stable condition.
FINDINGS: Bilateral pulmonary emboli, mostly occlusive with associated
pulmonary hypertension.
IMPRESSION: 1. Bilateral main pulmonary emboli with associated pulmonary
arterial hypertension.
2. Technically successful mechanical thrombectomy of the bilateral
pulmonary arteries with reduction in pulmonary arterial
hypertension.
3. Technically successful temporary left common femoral artery and
common femoral vein vascular access for potential ECMO cannulation.
These accesses were removed upon completion of the case as ECMO was
not required.

## 2020-04-16 IMAGING — DX DG CHEST 1V PORT
1 series · 1 of 1 positions shown · non-contrast
Comparison: Single-view of the chest and CT chest [DATE].

CLINICAL DATA: Patient diagnosed with pulmonary embolus [DATE].
Cardiac dysfunction.

EXAM:
PORTABLE CHEST 1 VIEW

[chest]
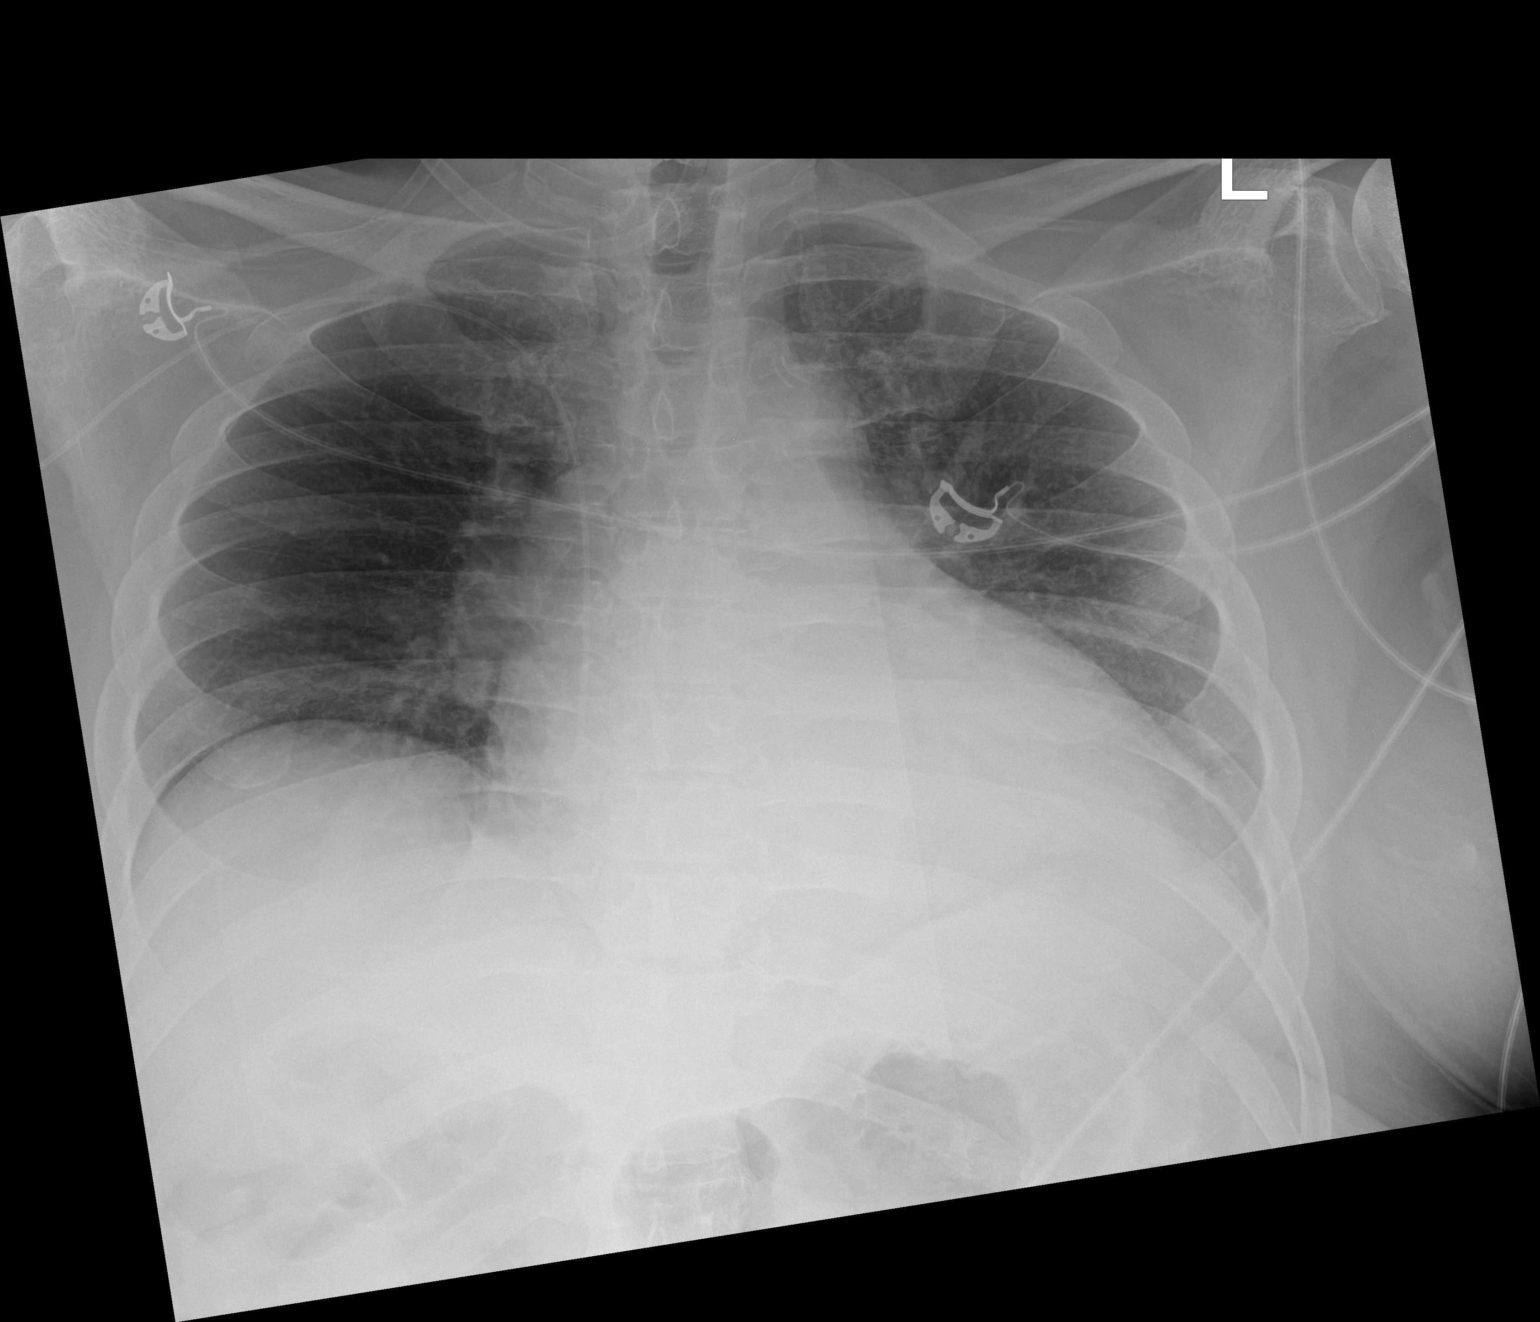

[1 of 1 positions shown; findings below may reference images not displayed]

FINDINGS: The patient's right PICC projects just within the right atrium. Mild
left basilar atelectasis is seen. Lungs otherwise clear.
Cardiomegaly. No pneumothorax or pleural fluid.
IMPRESSION: Left basilar atelectasis.

Cardiomegaly without edema.

## 2020-04-16 SURGERY — IR WITH ANESTHESIA
Anesthesia: General

## 2020-04-16 SURGERY — IR WITH ANESTHESIA
Anesthesia: Monitor Anesthesia Care

## 2020-04-16 MED ORDER — IOHEXOL 300 MG/ML  SOLN
150.0000 mL | Freq: Once | INTRAMUSCULAR | Status: AC | PRN
  Administered 2020-04-16: 50 mL via INTRA_ARTERIAL

## 2020-04-16 MED ORDER — AMIODARONE HCL IN DEXTROSE 360-4.14 MG/200ML-% IV SOLN
INTRAVENOUS | Status: DC | PRN
  Administered 2020-04-16: 60 mg/h via INTRAVENOUS

## 2020-04-16 MED ORDER — SODIUM CHLORIDE 0.9 % IV SOLN: 250.0000 mL | INTRAVENOUS | Status: DC | PRN

## 2020-04-16 MED ORDER — LIDOCAINE HCL 1 % IJ SOLN
INTRAMUSCULAR | Status: AC
  Filled 2020-04-16: qty 20

## 2020-04-16 MED ORDER — MAGNESIUM SULFATE 50 % IJ SOLN
INTRAVENOUS | Status: DC | PRN
  Administered 2020-04-16: 2 g via INTRAVENOUS

## 2020-04-16 MED ORDER — FENTANYL CITRATE (PF) 250 MCG/5ML IJ SOLN
INTRAMUSCULAR | Status: DC | PRN
  Administered 2020-04-16 (×2): 12.5 ug via INTRAVENOUS

## 2020-04-16 MED ORDER — SODIUM CHLORIDE 0.9% FLUSH
3.0000 mL | Freq: Two times a day (BID) | INTRAVENOUS | Status: DC
  Administered 2020-04-17 – 2020-04-23 (×12): 3 mL via INTRAVENOUS

## 2020-04-16 MED ORDER — SODIUM CHLORIDE 0.9 % IV SOLN: INTRAVENOUS | Status: DC

## 2020-04-16 MED ORDER — LIDOCAINE-EPINEPHRINE 1 %-1:100000 IJ SOLN
INTRAMUSCULAR | Status: AC
  Filled 2020-04-16: qty 1

## 2020-04-16 MED ORDER — SODIUM CHLORIDE 0.9 % IV SOLN
12.0000 mg | Freq: Once | INTRAVENOUS | Status: DC
  Filled 2020-04-16: qty 12

## 2020-04-16 MED ORDER — LIDOCAINE 2% (20 MG/ML) 5 ML SYRINGE
INTRAMUSCULAR | Status: DC | PRN
  Administered 2020-04-16: 100 mg via INTRAVENOUS

## 2020-04-16 MED ORDER — AMIODARONE HCL IN DEXTROSE 360-4.14 MG/200ML-% IV SOLN
30.0000 mg/h | INTRAVENOUS | Status: DC
  Administered 2020-04-16: 30 mg/h via INTRAVENOUS
  Filled 2020-04-16: qty 200

## 2020-04-16 MED ORDER — AMIODARONE HCL IN DEXTROSE 360-4.14 MG/200ML-% IV SOLN
60.0000 mg/h | INTRAVENOUS | Status: AC
  Administered 2020-04-16: 60 mg/h via INTRAVENOUS

## 2020-04-16 MED ORDER — ALBUMIN HUMAN 5 % IV SOLN: INTRAVENOUS | Status: DC | PRN

## 2020-04-16 MED ORDER — FENTANYL CITRATE (PF) 100 MCG/2ML IJ SOLN
INTRAMUSCULAR | Status: AC
  Filled 2020-04-16: qty 2

## 2020-04-16 MED ORDER — FENTANYL 2500MCG IN NS 250ML (10MCG/ML) PREMIX INFUSION
0.0000 ug/h | Freq: Once | INTRAVENOUS | Status: DC
  Filled 2020-04-16 (×2): qty 250

## 2020-04-16 MED ORDER — MIDAZOLAM HCL 5 MG/5ML IJ SOLN
INTRAMUSCULAR | Status: DC | PRN
  Administered 2020-04-16 (×4): .5 mg via INTRAVENOUS

## 2020-04-16 MED ORDER — SODIUM CHLORIDE 0.9% FLUSH
3.0000 mL | INTRAVENOUS | Status: DC | PRN
  Administered 2020-04-20 – 2020-04-23 (×2): 3 mL via INTRAVENOUS

## 2020-04-16 MED ORDER — MIDAZOLAM HCL 2 MG/2ML IJ SOLN
INTRAMUSCULAR | Status: AC
  Filled 2020-04-16: qty 2

## 2020-04-16 MED ORDER — AMIODARONE HCL IN DEXTROSE 360-4.14 MG/200ML-% IV SOLN
INTRAVENOUS | Status: AC
  Filled 2020-04-16: qty 200

## 2020-04-16 MED ORDER — SODIUM CHLORIDE 0.9% IV SOLUTION: Freq: Once | INTRAVENOUS | Status: DC

## 2020-04-16 MED ORDER — IOHEXOL 300 MG/ML  SOLN
150.0000 mL | Freq: Once | INTRAMUSCULAR | Status: AC | PRN
  Administered 2020-04-16: 50 mL

## 2020-04-16 MED ORDER — SODIUM CHLORIDE 0.9 % IV SOLN
INTRAVENOUS | Status: DC | PRN
  Administered 2020-04-16: 1000 mL via INTRAVENOUS
  Administered 2020-04-18: 500 mL via INTRAVENOUS

## 2020-04-16 NOTE — Progress Notes (Signed)
Events of the evening noted. Appreciate TRH, CCM and IR evaluation.   Found to have submissive central PE after cardiac Cath in setting of known RLE DVT as well as severe NICM with EF 15%.   (RHC performed from left brachial vein). HR went from 120s -> 130s after Cath. O2 and BP stable. RHC does confirm RV strain so I feel considering clot removal may be indicated.   That said, patient has large LV thrombus so any a ttempts at lysis will carry some risk of left-sided embolic event from LV including stroke and coronary embolization. Catheter-based thrombectomy with Inari device may be another option to consider.   Can discuss with multi-disciplinary PE team.   .Pierre Bali, MD

## 2020-04-16 NOTE — Progress Notes (Signed)
Events overnight noted.  Patient noted to have worsening shortness of breath overnight, with hemoptysis and chest pain post cardiac cath.  CT angiogram chest revealed submassive bilateral PE with right ventricular strain. Heart failure team and PCCM notified overnight.  IR notified and patient underwent thrombectomy with IR today.  Patient on IV heparin. Patient seen post procedure in the ICU, improvement with shortness of breath and hemoptysis.  Patient noted to be somewhat hypoxic and placed on nitrous oxide per PCCM. PCCM taking over care of patient as primary attending.  No charge.

## 2020-04-16 NOTE — Progress Notes (Signed)
[facility] for Heparin Indication: DVT and apical thrombus  Allergies  Allergen Reactions  . Pseudoephedrine     REACTION: jittery    Patient Measurements: Height: 6\' 1"  (185.4 cm) Weight: 124.9 kg (275 lb 5.7 oz) IBW/kg (Calculated) : 79.9 Heparin Dosing Weight: 110 kg  Vital Signs: Temp: 97.9 F (36.6 C) (03/18 0425) Temp Source: Oral (03/18 0425) BP: 115/91 (03/18 0425) Pulse Rate: 125 (03/18 0425)  Labs: Recent Labs    04/14/20 0713 04/14/20 0714 04/14/20 1850 04/15/20 0659 04/15/20 0700 04/15/20 1527 04/15/20 1530 04/16/20 0413 04/16/20 0445 04/16/20 0446 04/16/20 0515  HGB 15.6  --   --  16.3  --  18.4*  18.0* 17.3*  --  16.7  --   --   HCT 48.9  --   --  49.4  --  54.0*  53.0* 51.0  --  51.7  --   --   PLT 149*  --   --  175  --   --   --   --  189  --   --   LABPROT  --   --   --   --   --   --   --   --   --   --  18.5*  INR  --   --   --   --   --   --   --   --   --   --  1.6*  HEPARINUNFRC  --    < >  --   --  0.37  --   --  0.41  --   --  0.24*  CREATININE  --   --  1.28* 1.40*  --   --   --   --   --  1.40*  --    < > = values in this interval not displayed.    Estimated Creatinine Clearance: 86.4 mL/min (A) (by C-G formula based on SCr of 1.4 mg/dL (H)).  Assessment: 50 y.o. male with RLE DVT and apical thrombus on IV heparin. Heparin level previously therapeutic on drip rate 2300 units/hr prior to stopping for cath this afternoon. 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  Pharmacy consulted to resume heparin 4hrs post-sheath removal 3/17. Heparin level 0.41 (this was drawn from PICC where heparin running and likely contaminated), redraw by phlebotomy was subtherapeutic (0.24) on gtt at 2300 units/hr. No issues with line or bleeding reported per RN.  Noted plan for pt to go to IR for cath directed  alteplase via EKOS this morning.  Goal of Therapy:  Heparin level 0.3-0.7 units/ml Monitor platelets by anticoagulation protocol: Yes   Plan:  Increase heparin to 2500 units/hr Will f/u heparin level 6 hours  Sherlon Handing, PharmD, BCPS Please see amion for complete clinical pharmacist phone list 04/16/2020 6:15 AM

## 2020-04-16 NOTE — Anesthesia Procedure Notes (Signed)
Procedure Name: MAC Date/Time: 04/16/2020 12:00 PM Performed by: Inda Coke, CRNA Pre-anesthesia Checklist: Patient identified, Emergency Drugs available, Suction available, Timeout performed and Patient being monitored Patient Re-evaluated:Patient Re-evaluated prior to induction Oxygen Delivery Method: Nasal cannula Induction Type: IV induction Dental Injury: Teeth and Oropharynx as per pre-operative assessment

## 2020-04-16 NOTE — Progress Notes (Signed)
Pulmonary pressures during case:  69/33 (45) at 1241  62/28 (38) at 1325  45/32 (35) at 1434

## 2020-04-16 NOTE — Consult Note (Signed)
ECMO Consult Note   Called to IR for ECMO Consult at (time) 1200 by Bensimhon MD Admitting Diagnosis- CHF Primary Issue- High risk pulmonary embolism Age:50 y.o. Weight: 124 BMI: 36kg/m2  Days on Mechanical Ventilation- 0  MAP FiO2 Oxygen Index P/F Ratio  75 10L/min      Vasopressors milrinone   MSOF No   RESP score (VV-ECMO) : http://www.respscore.com  SAVE score (VA-ECMO) : http://www.save-score.com  Recent Blood Gas:     Component Value Date/Time   PHART 7.474 (H) 04/16/2020 1320   PCO2ART 52.0 (H) 04/16/2020 1320   PO2ART 66 (L) 04/16/2020 1320   HCO3 38.3 (H) 04/16/2020 1320   TCO2 40 (H) 04/16/2020 1320   O2SAT 94.0 04/16/2020 1320    Coags:    Component Value Date/Time   INR 1.6 (H) 04/16/2020 0515    CBC    Component Value Date/Time   WBC 8.1 04/16/2020 0445   RBC 6.60 (H) 04/16/2020 0445   HGB 18.4 (H) 04/16/2020 1320   HCT 54.0 (H) 04/16/2020 1320   PLT 189 04/16/2020 0445   MCV 78.3 (L) 04/16/2020 0445   MCH 25.3 (L) 04/16/2020 0445   MCHC 32.3 04/16/2020 0445   RDW 17.6 (H) 04/16/2020 0445   LYMPHSABS 1.1 04/11/2020 1140   MONOABS 0.8 04/11/2020 1140   EOSABS 0.0 04/11/2020 1140   BASOSABS 0.0 04/11/2020 1140    BMET    Component Value Date/Time   NA 137 04/16/2020 1320   K 3.7 04/16/2020 1320   CL 89 (L) 04/16/2020 0446   CO2 35 (H) 04/16/2020 0446   GLUCOSE 134 (H) 04/16/2020 0446   GLUCOSE 90 11/22/2005 1016   BUN 27 (H) 04/16/2020 0446   CREATININE 1.40 (H) 04/16/2020 0446   CALCIUM 9.0 04/16/2020 0446   GFRNONAA >60 04/16/2020 0446   GFRAA 108 10/03/2007 1056                                                                                                                                         Candidate meets ECMO Criteria- Yes  Placed on ECMO watch at 1100 (time).   Kevin Baxter is an 50 y.o. male.  HPI: 50 year old man who presented with abdominal pain, nausea and vomiting and was found to have HFrEF with EF  20%.   Underwent cardiac cath yesterday when he experienced a significant desaturation at the end. Eventually found to have right femoral DVT and high-risk bilateral PE.  Decision was made to bring the patient to IR for clot aspiration as presence of LV clot felt to be strong contraindication to thrombolytics.  Decision was made to deploy [place] on standby during case.   Past Medical History:  Diagnosis Date  . Allergy   . Anxiety   . Arthritis   . Fundic gland polyposis of stomach   . GERD with esophagitis   . Gout   .  Hx of adenomatous colonic polyps   . Hypertension     Past Surgical History:  Procedure Laterality Date  . COLONOSCOPY  05/2019  . ESOPHAGOGASTRODUODENOSCOPY  05/2019  . HAND SURGERY Right    2006 for a FX  . RIGHT/LEFT HEART CATH AND CORONARY ANGIOGRAPHY N/A 04/15/2020   Procedure: RIGHT/LEFT HEART CATH AND CORONARY ANGIOGRAPHY;  Surgeon: Jolaine Artist, MD;  Location: [facility];  Service: Cardiovascular;  Laterality: N/A;    Family History  Problem Relation Age of Onset  . Cancer Mother        CERVICAL  . Hypertension Father   . Heart failure Father        no MI, d/t etoh  . Diabetes Sister   . Diabetes Sister   . Diabetes Paternal Aunt   . Diabetes Cousin   . Colon cancer Neg Hx   . Prostate cancer Neg Hx   . Stroke Neg Hx   . Esophageal cancer Neg Hx   . Rectal cancer Neg Hx   . Stomach cancer Neg Hx     Social History:  reports that he has never smoked. He has never used smokeless tobacco. He reports current alcohol use. He reports that he does not use drugs.  Allergies:  Allergies  Allergen Reactions  . Pseudoephedrine     REACTION: jittery    Medications: I have reviewed the patient's current medications.  Results for orders placed or performed during the hospital encounter of 04/11/20 (from the past 48 hour(s))  Basic metabolic panel     Status: Abnormal   Collection Time: 04/14/20  6:50 PM  Result Value Ref Range    Sodium 138 135 - 145 mmol/L   Potassium 3.5 3.5 - 5.1 mmol/L    Comment: DELTA CHECK NOTED   Chloride 94 (L) 98 - 111 mmol/L   CO2 33 (H) 22 - 32 mmol/L   Glucose, Bld 140 (H) 70 - 99 mg/dL    Comment: Glucose reference range applies only to samples taken after fasting for at least 8 hours.   BUN 24 (H) 6 - 20 mg/dL   Creatinine, Ser 1.28 (H) 0.61 - 1.24 mg/dL   Calcium 8.8 (L) 8.9 - 10.3 mg/dL   GFR, Estimated >60 >60 mL/min    Comment: (NOTE) Calculated using the CKD-EPI Creatinine Equation (2021)    Anion gap 11 5 - 15    Comment: Performed at [facility] [address] [place], [place] [address]  .Cooxemetry Panel (carboxy, met, total hgb, O2 sat)     Status: None   Collection Time: 04/14/20 10:16 PM  Result Value Ref Range   Total hemoglobin 16.0 12.0 - 16.0 g/dL   O2 Saturation 55.9 %   Carboxyhemoglobin 1.1 0.5 - 1.5 %   Methemoglobin 0.7 0.0 - 1.5 %    Comment: Performed at [facility] [address] [place], [place] [address]  .Cooxemetry Panel (carboxy, met, total hgb, O2 sat)     Status: Abnormal   Collection Time: 04/15/20  6:16 AM  Result Value Ref Range   Total hemoglobin 16.7 (H) 12.0 - 16.0 g/dL   O2 Saturation 63.9 %   Carboxyhemoglobin 1.1 0.5 - 1.5 %   Methemoglobin 0.8 0.0 - 1.5 %    Comment: Performed at [facility], [place] [address], [place],  [address]  Lipase, blood     Status: Abnormal   Collection Time: 04/15/20  6:59 AM  Result Value  Ref Range   Lipase 165 (H) 11 - 51 U/L    Comment: Performed at [facility], [address] [place], [place] [address]  CBC     Status: Abnormal   Collection Time: 04/15/20  6:59 AM  Result Value Ref Range   WBC 7.5 4.0 - 10.5 K/uL   RBC 6.38 (H) 4.22 - 5.81 MIL/uL   Hemoglobin 16.3 13.0 - 17.0 g/dL   HCT 28.3 66.2 - 94.7 %   MCV 77.4 (L) 80.0 - 100.0 fL   MCH 25.5 (L) 26.0 - 34.0 pg   MCHC 33.0 30.0 - 36.0 g/dL   RDW 65.4 (H) 65.0 - 35.4 %   Platelets 175 150 - 400 K/uL    nRBC 0.3 (H) 0.0 - 0.2 %    Comment: Performed at [facility], [address], [place], [place] [address]  Hepatic function panel     Status: Abnormal   Collection Time: 04/15/20  6:59 AM  Result Value Ref Range   Total Protein 6.8 6.5 - 8.1 g/dL   Albumin 2.7 (L) 3.5 - 5.0 g/dL   AST 56 (H) 15 - 41 U/L   ALT 111 (H) 0 - 44 U/L   Alkaline Phosphatase 63 38 - 126 U/L   Total Bilirubin 5.0 (H) 0.3 - 1.2 mg/dL   Bilirubin, Direct 2.2 (H) 0.0 - 0.2 mg/dL   Indirect Bilirubin 2.8 (H) 0.3 - 0.9 mg/dL    Comment: Performed at [facility], [address], [place], [place] [address]  Renal function panel     Status: Abnormal   Collection Time: 04/15/20  6:59 AM  Result Value Ref Range   Sodium 139 135 - 145 mmol/L   Potassium 3.8 3.5 - 5.1 mmol/L   Chloride 90 (L) 98 - 111 mmol/L   CO2 34 (H) 22 - 32 mmol/L   Glucose, Bld 116 (H) 70 - 99 mg/dL    Comment: Glucose reference range applies only to samples taken after fasting for at least 8 hours.   BUN 24 (H) 6 - 20 mg/dL   Creatinine, Ser 0.01 (H) 0.61 - 1.24 mg/dL   Calcium 8.8 (L) 8.9 - 10.3 mg/dL   Phosphorus 5.2 (H) 2.5 - 4.6 mg/dL   Albumin 2.6 (L) 3.5 - 5.0 g/dL   GFR, Estimated >74 >94 mL/min    Comment: (NOTE) Calculated using the CKD-EPI Creatinine Equation (2021)    Anion gap 15 5 - 15    Comment: Performed at [facility], [address], [place], [place] [address]  ABO/Rh     Status: None   Collection Time: 04/15/20  6:59 AM  Result Value Ref Range   ABO/RH(D)      O POS Performed at [facility], [address] [place], [place] [address]   Heparin level (unfractionated)     Status: None   Collection Time: 04/15/20  7:00 AM  Result Value Ref Range   Heparin Unfractionated 0.37 0.30 - 0.70 IU/mL    Comment: (NOTE) If heparin results are below expected values, and patient dosage has  been confirmed, suggest follow up testing of antithrombin Baxter levels. Performed at [facility],  [address] [place], [place] [address]   POCT I-Stat EG7     Status: Abnormal   Collection Time: 04/15/20  3:27 PM  Result Value Ref Range   pH, Ven 7.467 (H) 7.250 - 7.430   pCO2, Ven 53.9  44.0 - 60.0 mmHg   pO2, Ven 34.0 32.0 - 45.0 mmHg   Bicarbonate 38.9 (H) 20.0 - 28.0 mmol/L   TCO2 41 (H) 22 - 32 mmol/L   O2 Saturation 67.0 %   Acid-Base Excess 12.0 (H) 0.0 - 2.0 mmol/L   Sodium 140 135 - 145 mmol/L   Potassium 3.3 (L) 3.5 - 5.1 mmol/L   Calcium, Ion 1.04 (L) 1.15 - 1.40 mmol/L   HCT 53.0 (H) 39.0 - 52.0 %   Hemoglobin 18.0 (H) 13.0 - 17.0 g/dL   Sample type VENOUS   POCT I-Stat EG7     Status: Abnormal   Collection Time: 04/15/20  3:27 PM  Result Value Ref Range   pH, Ven 7.466 (H) 7.250 - 7.430   pCO2, Ven 55.6 44.0 - 60.0 mmHg   pO2, Ven 34.0 32.0 - 45.0 mmHg   Bicarbonate 40.1 (H) 20.0 - 28.0 mmol/L   TCO2 42 (H) 22 - 32 mmol/L   O2 Saturation 68.0 %   Acid-Base Excess 13.0 (H) 0.0 - 2.0 mmol/L   Sodium 140 135 - 145 mmol/L   Potassium 3.4 (L) 3.5 - 5.1 mmol/L   Calcium, Ion 1.08 (L) 1.15 - 1.40 mmol/L   HCT 54.0 (H) 39.0 - 52.0 %   Hemoglobin 18.4 (H) 13.0 - 17.0 g/dL   Sample type VENOUS   I-STAT 7, (LYTES, BLD GAS, ICA, H+H)     Status: Abnormal   Collection Time: 04/15/20  3:30 PM  Result Value Ref Range   pH, Arterial 7.498 (H) 7.350 - 7.450   pCO2 arterial 46.7 32.0 - 48.0 mmHg   pO2, Arterial 88 83.0 - 108.0 mmHg   Bicarbonate 36.2 (H) 20.0 - 28.0 mmol/L   TCO2 38 (H) 22 - 32 mmol/L   O2 Saturation 97.0 %   Acid-Base Excess 11.0 (H) 0.0 - 2.0 mmol/L   Sodium 142 135 - 145 mmol/L   Potassium 3.2 (L) 3.5 - 5.1 mmol/L   Calcium, Ion 0.97 (L) 1.15 - 1.40 mmol/L   HCT 51.0 39.0 - 52.0 %   Hemoglobin 17.3 (H) 13.0 - 17.0 g/dL   Sample type ARTERIAL   .Cooxemetry Panel (carboxy, met, total hgb, O2 sat)     Status: None   Collection Time: 04/15/20  6:15 PM  Result Value Ref Range   Total hemoglobin 16.0 12.0 - 16.0 g/dL   O2 Saturation 66.7 %    Carboxyhemoglobin 1.1 0.5 - 1.5 %   Methemoglobin 0.6 0.0 - 1.5 %    Comment: Performed at [facility] [address], [place], [place] [address]  Brain natriuretic peptide     Status: Abnormal   Collection Time: 04/16/20  4:06 AM  Result Value Ref Range   B Natriuretic Peptide 885.4 (H) 0.0 - 100.0 pg/mL    Comment: Performed at [facility] [address] [place], [place] [address]  Troponin I (High Sensitivity)     Status: Abnormal   Collection Time: 04/16/20  4:06 AM  Result Value Ref Range   Troponin I (High Sensitivity) 60 (H) <18 ng/L    Comment: (NOTE) Elevated high sensitivity troponin I (hsTnI) values and significant  changes across serial measurements may suggest ACS but many other  chronic and acute conditions are known to elevate hsTnI results.  Refer to the "Links" section for chest pain algorithms and additional  guidance. Performed at [facility], [place] [address] [place], [place] [address]   Heparin level (  unfractionated)     Status: None   Collection Time: 04/16/20  4:13 AM  Result Value Ref Range   Heparin Unfractionated 0.41 0.30 - 0.70 IU/mL    Comment: (NOTE) If heparin results are below expected values, and patient dosage has  been confirmed, suggest follow up testing of antithrombin Baxter levels. Performed at [facility], [place] [address], [place], [place] [address]   .Cooxemetry Panel (carboxy, met, total hgb, O2 sat)     Status: Abnormal   Collection Time: 04/16/20  4:41 AM  Result Value Ref Range   Total hemoglobin 17.0 (H) 12.0 - 16.0 g/dL   O2 Saturation 69.0 %   Carboxyhemoglobin 1.2 0.5 - 1.5 %   Methemoglobin 0.9 0.0 - 1.5 %    Comment: Performed at [facility], [place] [address], [place], [place] [address]  Lipase, blood     Status: Abnormal   Collection Time: 04/16/20  4:45 AM  Result Value Ref Range   Lipase 152 (H) 11 - 51 U/L    Comment: Performed at [facility], [place] [address], [place], [place]  [address]  CBC     Status: Abnormal   Collection Time: 04/16/20  4:45 AM  Result Value Ref Range   WBC 8.1 4.0 - 10.5 K/uL   RBC 6.60 (H) 4.22 - 5.81 MIL/uL   Hemoglobin 16.7 13.0 - 17.0 g/dL   HCT 51.7 39.0 - 52.0 %   MCV 78.3 (L) 80.0 - 100.0 fL   MCH 25.3 (L) 26.0 - 34.0 pg   MCHC 32.3 30.0 - 36.0 g/dL   RDW 17.6 (H) 11.5 - 15.5 %   Platelets 189 150 - 400 K/uL   nRBC 0.2 0.0 - 0.2 %    Comment: Performed at [facility], [place] [address], [place], [place] [address]  Hepatic function panel     Status: Abnormal   Collection Time: 04/16/20  4:45 AM  Result Value Ref Range   Total Protein 7.0 6.5 - 8.1 g/dL   Albumin 2.6 (L) 3.5 - 5.0 g/dL   AST 52 (H) 15 - 41 U/L   ALT 92 (H) 0 - 44 U/L   Alkaline Phosphatase 65 38 - 126 U/L   Total Bilirubin 5.0 (H) 0.3 - 1.2 mg/dL   Bilirubin, Direct 2.3 (H) 0.0 - 0.2 mg/dL   Indirect Bilirubin 2.7 (H) 0.3 - 0.9 mg/dL    Comment: Performed at [facility] [address], [place], [place] [address]  Renal function panel     Status: Abnormal   Collection Time: 04/16/20  4:46 AM  Result Value Ref Range   Sodium 136 135 - 145 mmol/L   Potassium 3.9 3.5 - 5.1 mmol/L   Chloride 89 (L) 98 - 111 mmol/L   CO2 35 (H) 22 - 32 mmol/L   Glucose, Bld 134 (H) 70 - 99 mg/dL    Comment: Glucose reference range applies only to samples taken after fasting for at least 8 hours.   BUN 27 (H) 6 - 20 mg/dL   Creatinine, Ser 1.40 (H) 0.61 - 1.24 mg/dL   Calcium 9.0 8.9 - 10.3 mg/dL   Phosphorus 4.5 2.5 - 4.6 mg/dL   Albumin 2.6 (L) 3.5 - 5.0 g/dL   GFR, Estimated >60 >60 mL/min    Comment: (NOTE) Calculated using the CKD-EPI Creatinine Equation (2021)    Anion gap 12 5 - 15    Comment: Performed at [facility]  Lab, [address] [place], [place] [address]  Heparin level (unfractionated)     Status: Abnormal   Collection Time: 04/16/20  5:15 AM  Result Value Ref Range   Heparin Unfractionated 0.24 (L) 0.30 - 0.70 IU/mL    Comment: (NOTE) If  heparin results are below expected values, and patient dosage has  been confirmed, suggest follow up testing of antithrombin Baxter levels. Performed at [facility], [place] [address] [place], [place] [address]   Protime-INR     Status: Abnormal   Collection Time: 04/16/20  5:15 AM  Result Value Ref Range   Prothrombin Time 18.5 (H) 11.4 - 15.2 seconds   INR 1.6 (H) 0.8 - 1.2    Comment: (NOTE) INR goal varies based on device and disease states. Performed at [facility], [place] [address] [place], [place] [address]   Prepare RBC (crossmatch)     Status: None   Collection Time: 04/16/20 12:37 PM  Result Value Ref Range   Order Confirmation      ORDER PROCESSED BY BLOOD BANK Performed at [facility], [place] [address] [place], [place] [address]   Type and screen     Status: None (Preliminary result)   Collection Time: 04/16/20  1:10 PM  Result Value Ref Range   ABO/RH(D) O POS    Antibody Screen NEG    Sample Expiration 04/19/2020,2359    Unit Number [id number]    Blood Component Type RED CELLS,LR    Unit division 00    Status of Unit ISSUED    Unit tag comment EMERGENCY RELEASE    Transfusion Status OK TO TRANSFUSE    Crossmatch Result COMPATIBLE    Unit Number [id number]    Blood Component Type RED CELLS,LR    Unit division 00    Status of Unit ISSUED    Transfusion Status OK TO TRANSFUSE    Crossmatch Result      Compatible Performed at [facility], [place] [address], [place], [place] [address]   I-STAT 7, (LYTES, BLD GAS, ICA, H+H)     Status: Abnormal   Collection Time: 04/16/20  1:20 PM  Result Value Ref Range   pH, Arterial 7.474 (H) 7.350 - 7.450   pCO2 arterial 52.0 (H) 32.0 - 48.0 mmHg   pO2, Arterial 66 (L) 83.0 - 108.0 mmHg   Bicarbonate 38.3 (H) 20.0 - 28.0 mmol/L   TCO2 40 (H) 22 - 32 mmol/L   O2 Saturation 94.0 %   Acid-Base Excess 12.0 (H) 0.0 - 2.0 mmol/L   Sodium 137 135 - 145 mmol/L   Potassium 3.7 3.5 - 5.1 mmol/L    Calcium, Ion 1.15 1.15 - 1.40 mmol/L   HCT 54.0 (H) 39.0 - 52.0 %   Hemoglobin 18.4 (H) 13.0 - 17.0 g/dL   Patient temperature 36.8 C    Sample type ARTERIAL     CT ANGIO CHEST PE W OR WO CONTRAST  Result Date: 04/16/2020 CLINICAL DATA:  Chest pain, left ventricular thrombus on MRI, cardiac dysfunction EXAM: CT ANGIOGRAPHY CHEST WITH CONTRAST TECHNIQUE: Multidetector CT imaging of the chest was performed using the standard protocol during bolus administration of intravenous contrast. Multiplanar CT image reconstructions and MIPs were obtained to evaluate the vascular anatomy. CONTRAST:  66mL OMNIPAQUE IOHEXOL 350 MG/ML SOLN COMPARISON:  02/26/2020, 04/15/2020 FINDINGS: Cardiovascular: This is a technically adequate evaluation of the pulmonary vasculature. There are large central pulmonary emboli seen bilaterally within the main pulmonary arteries,  and extending into the segmental branches left greater than right. There is severe clot burden, with evidence of right heart strain with the RV/LV ratio measuring approximately 1.2. The heart is enlarged without pericardial effusion. Normal caliber of the thoracic aorta. Mediastinum/Nodes: No enlarged mediastinal, hilar, or axillary lymph nodes. Thyroid gland, trachea, and esophagus demonstrate no significant findings. Lungs/Pleura: Hypoventilatory changes are seen bilaterally within the lungs. Small peripheral areas of wedge-shaped consolidation are seen within the right upper and right middle lobes consistent with pulmonary infarcts. No effusion or pneumothorax. Central airways are patent. Upper Abdomen: No acute abnormality. Musculoskeletal: No acute or destructive bony lesions. Reconstructed images demonstrate no additional findings. Review of the MIP images confirms the above findings. IMPRESSION: 1. Acute bilateral central pulmonary emboli. Positive for acute PE with CT evidence of right heart strain (RV/LV Ratio = 1.2) consistent with at least submassive  (intermediate risk) PE. The presence of right heart strain has been associated with an increased risk of morbidity and mortality. 2. Cardiomegaly. 3. Bilateral hypoventilatory changes, with scattered peripheral areas of consolidation concerning for developing pulmonary infarcts. Critical Value/emergent results were called by telephone at the time of interpretation on 04/16/2020 at 12:05 am to provider Dr. Ander Slade, who verbally acknowledged these results. Electronically Signed   By: Randa Ngo M.D.   On: 04/16/2020 00:11   CARDIAC CATHETERIZATION  Result Date: 04/15/2020 Findings: On milrinone 0.125 mcg/kg/min Ao =125/92 (109) LV = 122/25 RA =  15 RV = 63/20 PA = 67/32 (46) PCW = 22 (v=28) Fick cardiac output/index = 5.0/2.0 PVR = 6.0 WU SVR = 1513 Ao sat = 97% PA sat = 66%, 67% PAPi = 2.33 Assessment: 1. Normal coronary arteries 2. 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 Moderate mixed pulmonary venous HTN 4. Elevated filling pressures 5. Marginal CI on milrinone Plan/Discussion: Continue medical therapy. Glori Bickers, MD 4:12 PM   DG CHEST PORT 1 VIEW  Result Date: 04/15/2020 CLINICAL DATA:  Chest pain at rest. EXAM: PORTABLE CHEST 1 VIEW COMPARISON:  Radiograph 04/12/2020 FINDINGS: Right upper extremity PICC tip at the atrial caval junction. Lower lung volumes from prior exam leading to bronchovascular crowding. Cardiomegaly. No convincing pulmonary edema or pleural effusion. No pneumothorax. Elevation of right hemidiaphragm. IMPRESSION: Cardiomegaly. Lower lung volumes from prior exam leading to bronchovascular crowding. Electronically Signed   By: Keith Rake M.D.   On: 04/15/2020 18:35   MR CARDIAC MORPHOLOGY W WO CONTRAST  Result Date: 04/15/2020 CLINICAL DATA:  Clinical question of cardiomyopathy and left ventricular mass 50 year old Male Study assumes HCT of 96 EXAM: CARDIAC MRI TECHNIQUE: The patient was scanned on a 1.5 Tesla GE magnet. A dedicated cardiac coil was used. Functional  imaging was done using Fiesta sequences. 2,3, and 4 chamber views were done to assess for RWMA's. Modified Simpson's rule using a short axis stack was used to calculate an ejection fraction on a dedicated work Conservation officer, nature. The patient received 10 cc of Gadavist. After 10 minutes inversion recovery sequences were used to assess for infiltration and scar tissue. CONTRAST:  10 cc  of Gadavist FINDINGS: 1. Severe left ventricular dilation, with LVEDD 71 mm, and LVEDVi 130 mL/m2. Normal left ventricular thickness, with intraventricular septal thickness of 7 mm, posterior wall thickness of 5 mm. Severe left ventricular systolic dysfunction (LVEF =12%). There is severe global hypokinesis with apical dyskinesis. Left ventricular parametric mapping notable for diffuse increase in ECV signal in the apex (30-40%). Normal T2 signal. There is  no late gadolinium enhancement in the left ventricular myocardium. There is a density 26 mm X 23 mm X 10.5 mm in the left ventricular apex that does not perfuse with contrast consistent with LV thrombus. 2. Severe right ventricular dilation with RVEDVI 104 mL/m2. Normal right ventricular thickness. Severe right ventricular systolic dysfunction (RVEF =21 %). Severe global ventricular hypokinesis with apical akinesis. 3.  Normal size of the left and moderate right atrial dilation: 4. Normal size of the aortic root, ascending aorta. Pulmonary artery is mildly dilated at 40 mm. 5. Tricuspid regurgitation and mitral regurgitation is visualized qualitatively. 6.  Normal pericardium.  No pericardial effusion. 7. Grossly, no extracardiac findings. Recommended dedicated study if concerned for non-cardiac pathology. IMPRESSION: 1.  Severe left ventricular dilation. 2. Severe left ventricular systolic dysfunction (LVEF =12%). There is severe global hypokinesis with apical dyskinesis. 3.  Diffuse increase in ECV signal in the apex (30-40%). 4.  There is likely an LV thrombus. 5.   Severe right ventricular dilation. 6. Severe right ventricular systolic dysfunction (RVEF =21 %). Severe global ventricular hypokinesis with apical akinesis. 7.  Moderate right atrial dilation 8.  Pulmonary artery is mildly dilated at 40 mm 9. Tricuspid regurgitation and mitral regurgitation is visualized qualitatively. 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 rate (!) 24, height 6\' 1"  (1.854 m), weight 124.9 kg, SpO2 94 %. Physical Exam Constitutional:      General: He is not in acute distress. Eyes:     Conjunctiva/sclera: Conjunctivae normal.     Pupils: Pupils are equal, round, and reactive to light.  Cardiovascular:     Rate and Rhythm: Regular rhythm. Tachycardia present.     Heart sounds: Gallop present.   Pulmonary:     Effort: Pulmonary effort is normal.     Breath sounds: Normal breath sounds.  Abdominal:     General: Abdomen is flat.  Musculoskeletal:     Comments: Femoral access sites are intact  Neurological:     General: No focal deficit present.     Mental Status: He is alert.     Assessment/Plan:  Critically ill due to acute pulmonary edema requiring clot extraction and possible VA ECMO in case of cardiac decompensation.  HFrEF R leg DVT  Plan:  - patient was supported throughout the case on milrinone and 10lpm [place]. Developed increasing hypoxia and coughing during procedure - treated with increased O2 and initiation of inhaled NO -runs of polymorphic VT treated with initiation of amiodarone iv and IV magnesium  Patient was able to tolerate procedure without need for ECMO.   CRITICAL CARE Performed by: Kipp Brood   Total critical care time: 60 minutes  Critical care time was  exclusive of separately billable procedures and treating other patients.  Critical care was necessary to treat or prevent imminent or life-threatening deterioration.  Critical care was time spent personally by me on the following activities: development of treatment plan with patient and/or surrogate as well as nursing, discussions with consultants, evaluation of patient's response to treatment, examination of patient, obtaining history from patient or surrogate, ordering and performing treatments and interventions, ordering and review of laboratory studies, ordering and review of radiographic studies, pulse oximetry, re-evaluation of patient's condition and  participation in multidisciplinary rounds.  Kipp Brood, MD [facility] ICU Physician [place]  Pager: [phone] Mobile: [phone] After hours: [phone].       Kipp Brood Date: 04/16/2020 Time: 3:55 PM

## 2020-04-16 NOTE — Procedures (Signed)
Interventional Radiology Procedure Note  Procedure:  1) Pulmonary angiogram 2) Pulmonary manometry 3) Bilateral pulmonary artery mechanical thrombectomy 4) Left common femoral artery and vein access for standby ECMO (removed)  Findings: Please refer to procedural dictation for full description.  Bilateral pulmonary emboli, near occlusive at distal aspects of main right and left pulmonary arteries.  Pre-procedure main PA pressure of 69/33, mean 45.  Post-thrombectomy main PA pressure of 50/33, mean 38.    Pursestring suture of right common femoral vein (12 Fr) acces.  Manual pressure with hemostasis of left common femoral artery (4 Fr) and common femoral vein (4 Fr ) accesses.  Complications: None immediate  Estimated Blood Loss: 650 mL   Recommendations: Flat for 4 hours given left common femoral arterial access. Transfer from IR to ICU. IR will follow.   Ruthann Cancer, MD

## 2020-04-16 NOTE — Progress Notes (Signed)
Patient complaining of 6/10 chest discomfort. Paged cardiology and got order for chest CT to rule out a PE and 2mg  iv morphine for pain. I Also notified [facility] about situation.   Critical Chest CT result. [facility] notified awaiting for orders. Patient currently stable, denies chest discomfort. Sating at 96 % on 3L.

## 2020-04-16 NOTE — Progress Notes (Signed)
  ECMO team on standby the entire time in the room with the patient during catheter based PA thrombectomy by Dr. Serafina Royals.  Full team mobilized and action plan discussed prior to case in team huddle.   During the procedure hemodynamics we monitored consistently and responded to changes in vitals signs and clinical status.   Angiograms reviewed with Dr. Serafina Royals during case and aided in clinical decision-making. Patient then returned to CCU in critical but stable condition.  Patient monitored frequently in CCU throughout the day with gradual improvement in respiratory status and vitals signs on IV milrinone.   Additional CCT 2 hours.   Glori Bickers, MD  10:03 PM

## 2020-04-16 NOTE — Progress Notes (Signed)
[place] for Heparin Indication: DVT/PE and apical thrombus  Allergies  Allergen Reactions  . Pseudoephedrine     REACTION: jittery    Patient Measurements: Height: 6\' 1"  (185.4 cm) Weight: 124.9 kg (275 lb 5.7 oz) IBW/kg (Calculated) : 79.9 Heparin Dosing Weight: 110 kg  Vital Signs: Temp: 97.7 F (36.5 C) (03/18 1615) Temp Source: Oral (03/18 1615) BP: 112/83 (03/18 1730) Pulse Rate: 117 (03/18 1730)  Labs: Recent Labs    04/14/20 0713 04/14/20 0714 04/14/20 1850 04/15/20 0659 04/15/20 0700 04/16/20 0406 04/16/20 0413 04/16/20 0445 04/16/20 0446 04/16/20 0515 04/16/20 1320 04/16/20 1549 04/16/20 1648  HGB 15.6  --   --  16.3   < >  --   --  16.7  --   --  18.4* 16.7  --   HCT 48.9  --   --  49.4   < >  --   --  51.7  --   --  54.0* 49.0  --   PLT 149*  --   --  175  --   --   --  189  --   --   --   --   --   LABPROT  --   --   --   --   --   --   --   --   --  18.5*  --   --   --   INR  --   --   --   --   --   --   --   --   --  1.6*  --   --   --   HEPARINUNFRC  --    < >  --   --    < >  --  0.41  --   --  0.24*  --   --  0.69  CREATININE  --   --  1.28* 1.40*  --   --   --   --  1.40*  --   --   --   --   TROPONINIHS  --   --   --   --   --  60*  --   --   --   --   --   --   --    < > = values in this interval not displayed.    Estimated Creatinine Clearance: 86.4 mL/min (A) (by C-G formula based on SCr of 1.4 mg/dL (H)).  Assessment: 50 y.o. male with RLE DVT and apical thrombus continuing on IV heparin. 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  CTA showed submassive bilateral PE with RHS, now s/p IR for thrombectomy 3/18. Heparin drip was not turned off at all during procedure. Patient did not receive thrombolytics.  Heparin level therapeutic at 0.69 this evening but at top of range. CBC WNL. No  bleeding or issues with infusion per discussion with RN. Hemoptysis still noted but stable.  Goal of Therapy:  Heparin level 0.3-0.7 units/ml Monitor platelets by anticoagulation protocol: Yes   Plan:  Reduce heparin slightly to 2450 units/hr to ensure stays in range F/u heparin level 6 hours to confirm Monitor daily CBC, s/sx bleeding   Arturo Morton, PharmD, BCPS Please check AMION for all [facility] contact numbers Clinical Pharmacist 04/16/2020 5:44 PM

## 2020-04-16 NOTE — Progress Notes (Signed)
Patient called nurse to room because he was having some trouble breathing. Upon assessment patient sitting up in bed in a tripod position. He made the remark that he feels like he is dying. Vitals HR 120s RR 26 93% on 3 L BP 143/111. Paged [facility] got orders to give PRN morphine. While in room Critical Care came to speak with patient and wife about about possible taking patient down to IR. Will continue to monitor patient.

## 2020-04-16 NOTE — Progress Notes (Signed)
Spoke with critical care. After seeing the patient, and reviewing the images, they recommend calling IR. Critical care reports that they will create a bed in the ICU s/p IR. Dr. Tacy Learn also reports that patient is not a candidate for systemic tPA because he is not hypotensive. I relayed this information to IR. Who reports that they will come to see the patient.

## 2020-04-16 NOTE — Consult Note (Addendum)
Chief Complaint: Patient was seen in consultation today for pulmonary embolism  Referring Physician(s): Somalia Zierle-Ghosh, DO  Patient Status: [facility] - In-pt  History of Present Illness: Kevin Baxter is a 50 y.o. male currently admitted for right lower extremity DVT, acute on chronic heart failure found to have a left ventricular apical thrombus and bilateral main pulmonary emboli on CTA and cardiac MR performed on 04/15/20.  Chest pain increased on 3/17 which along with troponin elevation underwent coronary angiogram which was unremarkable except for suggestion of elevated right heart/pulmonary pressure.  He has been spitting up blood the past few days, underwent upper endoscopy with removal of some polyps but no GI etiology of bleeding identified.  He endorses chest pain and dyspnea, now on 3L [place] with sats at 96%. Echo on 04/12/20 significant for 3.6 cm LV thrombus and EF < 20%.   He denies any recent long travel, prior blood clots, or known clotting disorder.  No oncologic history, recent trauma or neurologic procedures.    Past Medical History:  Diagnosis Date  . Allergy   . Anxiety   . Arthritis   . Fundic gland polyposis of stomach   . GERD with esophagitis   . Gout   . Hx of adenomatous colonic polyps   . Hypertension     Past Surgical History:  Procedure Laterality Date  . COLONOSCOPY  05/2019  . ESOPHAGOGASTRODUODENOSCOPY  05/2019  . HAND SURGERY Right    2006 for a FX    Allergies: Pseudoephedrine  Medications: Prior to Admission medications   Medication Sig Start Date End Date Taking? Authorizing Provider  allopurinol (ZYLOPRIM) 300 MG tablet Take 1 tablet (300 mg total) by mouth daily. 12/22/19  Yes Paz, Alda Berthold, MD  buPROPion (WELLBUTRIN) 100 MG tablet Take 1 tablet a day x 1 week, then 1 tab BID Patient taking differently: Take 100 mg by mouth 2 (two) times daily. 03/02/20  Yes Paz, Alda Berthold, MD  dicyclomine (BENTYL) 20 MG tablet Take 1 tablet (20 mg total) by  mouth every 6 (six) hours as needed for spasms. Patient taking differently: Take 20 mg by mouth daily. 04/22/19  Yes Gatha Mayer, MD  losartan (COZAAR) 50 MG tablet Take 2 tablets (100 mg total) by mouth daily. 04/08/20  Yes Paz, Alda Berthold, MD  ondansetron (ZOFRAN ODT) 4 MG disintegrating tablet Take 1 tablet (4 mg total) by mouth every 8 (eight) hours as needed for nausea or vomiting. 04/09/20  Yes Carmin Muskrat, MD  sucralfate (CARAFATE) 1 g tablet Take 1 tablet (1 g total) by mouth 4 (four) times daily -  with meals and at bedtime. Patient taking differently: Take 1 g by mouth daily. 04/09/20  Yes Carmin Muskrat, MD  pantoprazole (PROTONIX) 40 MG tablet Take 1 tablet (40 mg total) by mouth daily. Patient not taking: No sig reported 03/22/20   Shelda Pal, DO     Family History  Problem Relation Age of Onset  . Cancer Mother        CERVICAL  . Hypertension Father   . Heart failure Father        no MI, d/t etoh  . Diabetes Sister   . Diabetes Sister   . Diabetes Paternal Aunt   . Diabetes Cousin   . Colon cancer Neg Hx   . Prostate cancer Neg Hx   . Stroke Neg Hx   . Esophageal cancer Neg Hx   . Rectal cancer Neg Hx   .  Stomach cancer Neg Hx     Social History   Socioeconomic History  . Marital status: Married    Spouse name: Not on file  . Number of children: 2  . Years of education: Not on file  . Highest education level: Not on file  Occupational History  . Occupation: works for [employer]  . Smoking status: Never Smoker  . Smokeless tobacco: Never Used  Vaping Use  . Vaping Use: Never used  Substance and Sexual Activity  . Alcohol use: Yes    Comment: very rare   . Drug use: No  . Sexual activity: Not on file  Other Topics Concern  . Not on file  Social History Narrative   Household: pt, wife daughter (2013),  boy  (2006),    Social Determinants of Radio broadcast assistant Strain: Not on file  Food Insecurity: Not on file   Transportation Needs: Not on file  Physical Activity: Not on file  Stress: Not on file  Social Connections: Not on file    Review of Systems: A 12 point ROS discussed and pertinent positives are indicated in the HPI above.  All other systems are negative.  Vital Signs: BP (!) 115/91 (BP Location: Left Arm)   Pulse (!) 125   Temp 97.9 F (36.6 C) (Oral)   Resp (!) 24   Ht 6\' 1"  (1.854 m)   Wt 127.2 kg   SpO2 95%   BMI 37.01 kg/m   Physical Exam HENT:     Head: Normocephalic.     Mouth/Throat:     Mouth: Mucous membranes are moist.  Eyes:     General: No scleral icterus. Cardiovascular:     Rate and Rhythm: Regular rhythm. Tachycardia present.     Pulses: Normal pulses.  Pulmonary:     Effort: Pulmonary effort is normal.  Abdominal:     General: There is no distension.  Musculoskeletal:     Right lower leg: No edema.     Left lower leg: No edema.  Skin:    General: Skin is warm and dry.  Neurological:     Mental Status: He is alert and oriented to person, place, and time.     Imaging: Cardiac MR (04/15/20):   CTPA 04/15/20:  RV:LV = 1.2  Labs:  CBC: Recent Labs    04/13/20 1040 04/14/20 0713 04/15/20 0659 04/15/20 1527 04/15/20 1530 04/16/20 0445  WBC 7.3 7.3 7.5  --   --  8.1  HGB 15.5 15.6 16.3 18.4*  18.0* 17.3* 16.7  HCT 48.7 48.9 49.4 54.0*  53.0* 51.0 51.7  PLT 108* 149* 175  --   --  189    COAGS: Recent Labs    04/12/20 1552  INR 1.9*  APTT 35    BMP: Recent Labs    04/13/20 0041 04/14/20 0712 04/14/20 1850 04/15/20 0659 04/15/20 1527 04/15/20 1530  NA 136 139 138 139 140  140 142  K 4.9 4.3 3.5 3.8 3.4*  3.3* 3.2*  CL 102 101 94* 90*  --   --   CO2 26 27 33* 34*  --   --   GLUCOSE 126* 114* 140* 116*  --   --   BUN 20 24* 24* 24*  --   --   CALCIUM 8.6* 9.1 8.8* 8.8*  --   --   CREATININE 1.26* 1.31* 1.28* 1.40*  --   --   GFRNONAA >60 >60 >60 >60  --   --  LIVER FUNCTION TESTS: Recent Labs     04/12/20 0027 04/13/20 0041 04/14/20 0712 04/14/20 0713 04/15/20 0659  BILITOT 4.0* 4.5*  --  4.6* 5.0*  AST 144* 104*  --  62* 56*  ALT 172* 169*  --  132* 111*  ALKPHOS 59 59  --  61 63  PROT 6.6 6.1*  --  6.4* 6.8  ALBUMIN 2.9* 2.5* 2.6* 2.6* 2.7*  2.6*    Trop 04/11/20:  38 BNP 04/12/20: 1,154, 04/16/20: 885  Heparin level = 0.41  PESI = 111, class IV, high risk   Assessment and Plan: 50 year old male with intermediate-high risk acute pulmonary embolism (class IV PESI) and right femoral vein deep vein thrombosis complicated by chronic heart failure (EF < 20%) and left ventricular thrombus.  Underlying etiology of thrombus likely related to undiagnosed polycythemia and sedentary lifestyle.  Given poor underlying cardiac function, plan for catheter directed thrombolysis over thrombectomy due to less catheter manipulation and procedure time.    Will request Anesthesia for sedation and cardiac support during procedure.  Patient will transfer to ICU after procedure.    Thank you for this interesting consult.  I greatly enjoyed meeting Kevin Baxter and look forward to participating in their care.  A copy of this report was sent to the requesting provider on this date.  ADDENDUM: After delay due to other hospital emergencies, the patient has developed hemoptysis, thus making high risk for bleeding with thombolysis.  Therefore, after discussion with Dr. Phillip Heal, will pursue thrombectomy with smaller caliber system and prepare for ECMO prior to starting procedure.  This was discussed with the patient and his wife who are in agreement.  Electronically Signed: Suzette Battiest, MD 04/16/2020, 5:24 AM   I spent a total of 68 Mi[place]utes  in face to face in clinical consultation, greater than 50% of which was counseling/coordinating care for acute pulmonary embolism.

## 2020-04-16 NOTE — Transfer of Care (Signed)
Immediate Anesthesia Transfer of Care Note  Patient: Kevin Baxter  Procedure(s) Performed: IR WITH ANESTHESIA- PULMONARY EMBOLUS INTERVENTION (N/A )  Patient Location: ICU  Anesthesia Type:MAC  Level of Consciousness: awake, alert  and oriented  Airway & Oxygen Therapy: Patient Spontanous Breathing and Patient connected to face mask oxygen  Post-op Assessment: Report given to RN and Post -op Vital signs reviewed and stable  Post vital signs: Reviewed and stable  Last Vitals:  Vitals Value Taken Time  BP    Temp    Pulse    Resp    SpO2      Last Pain:  Vitals:   04/16/20 0830  TempSrc:   PainSc: 0-No pain      Patients Stated Pain Goal: 0 (45/80/99 8338)  Complications: No complications documented.

## 2020-04-16 NOTE — Progress Notes (Signed)
Day of Surgery  Subjective: CC: Patient found to have b/l PE's yesterday on CTA. Going to IR today for procedure He notes he tolerated PO's yesterday without abdominal pain, n/v. Prior to being made NPO for procedure was on heart healthy diet.   Objective: Vital signs in last 24 hours: Temp:  [97.8 F (36.6 C)-98.7 F (37.1 C)] 97.9 F (36.6 C) (03/18 0425) Pulse Rate:  [121-130] 125 (03/18 0425) Resp:  [12-26] 24 (03/18 0425) BP: (115-152)/(84-111) 115/91 (03/18 0425) SpO2:  [5 %-98 %] 95 % (03/18 0425) Weight:  [124.9 kg] 124.9 kg (03/18 0534) Last BM Date: 04/13/20  Intake/Output from previous day: 03/17 0701 - 03/18 0700 In: 2377.5 [P.O.:1000; I.V.:1052.6; IV Piggyback:324.9] Out: 3500 [Urine:3500] Intake/Output this shift: Total I/O In: -  Out: 425 [Urine:425]  PE: Gen: Alert, NAD, pleasant Pulm:rate and effort normal Abd: Soft,ND,NT, +BS Psych: A&Ox3  Skin: no rashes noted, warm and dry   Lab Results:  Recent Labs    04/15/20 0659 04/15/20 1527 04/15/20 1530 04/16/20 0445  WBC 7.5  --   --  8.1  HGB 16.3   < > 17.3* 16.7  HCT 49.4   < > 51.0 51.7  PLT 175  --   --  189   < > = values in this interval not displayed.   BMET Recent Labs    04/15/20 0659 04/15/20 1527 04/15/20 1530 04/16/20 0446  NA 139   < > 142 136  K 3.8   < > 3.2* 3.9  CL 90*  --   --  89*  CO2 34*  --   --  35*  GLUCOSE 116*  --   --  134*  BUN 24*  --   --  27*  CREATININE 1.40*  --   --  1.40*  CALCIUM 8.8*  --   --  9.0   < > = values in this interval not displayed.   PT/INR Recent Labs    04/16/20 0515  LABPROT 18.5*  INR 1.6*   CMP     Component Value Date/Time   NA 136 04/16/2020 0446   K 3.9 04/16/2020 0446   CL 89 (L) 04/16/2020 0446   CO2 35 (H) 04/16/2020 0446   GLUCOSE 134 (H) 04/16/2020 0446   GLUCOSE 90 11/22/2005 1016   BUN 27 (H) 04/16/2020 0446   CREATININE 1.40 (H) 04/16/2020 0446   CALCIUM 9.0 04/16/2020 0446   PROT 7.0 04/16/2020  0445   ALBUMIN 2.6 (L) 04/16/2020 0446   AST 52 (H) 04/16/2020 0445   ALT 92 (H) 04/16/2020 0445   ALKPHOS 65 04/16/2020 0445   BILITOT 5.0 (H) 04/16/2020 0445   GFRNONAA >60 04/16/2020 0446   GFRAA 108 10/03/2007 1056   Lipase     Component Value Date/Time   LIPASE 152 (H) 04/16/2020 0445       Studies/Results: CT ANGIO CHEST PE W OR WO CONTRAST  Result Date: 04/16/2020 CLINICAL DATA:  Chest pain, left ventricular thrombus on MRI, cardiac dysfunction EXAM: CT ANGIOGRAPHY CHEST WITH CONTRAST TECHNIQUE: Multidetector CT imaging of the chest was performed using the standard protocol during bolus administration of intravenous contrast. Multiplanar CT image reconstructions and MIPs were obtained to evaluate the vascular anatomy. CONTRAST:  87mL OMNIPAQUE IOHEXOL 350 MG/ML SOLN COMPARISON:  02/26/2020, 04/15/2020 FINDINGS: Cardiovascular: This is a technically adequate evaluation of the pulmonary vasculature. There are large central pulmonary emboli seen bilaterally within the main pulmonary arteries, and extending into the segmental branches  left greater than right. There is severe clot burden, with evidence of right heart strain with the RV/LV ratio measuring approximately 1.2. The heart is enlarged without pericardial effusion. Normal caliber of the thoracic aorta. Mediastinum/Nodes: No enlarged mediastinal, hilar, or axillary lymph nodes. Thyroid gland, trachea, and esophagus demonstrate no significant findings. Lungs/Pleura: Hypoventilatory changes are seen bilaterally within the lungs. Small peripheral areas of wedge-shaped consolidation are seen within the right upper and right middle lobes consistent with pulmonary infarcts. No effusion or pneumothorax. Central airways are patent. Upper Abdomen: No acute abnormality. Musculoskeletal: No acute or destructive bony lesions. Reconstructed images demonstrate no additional findings. Review of the MIP images confirms the above findings. IMPRESSION:  1. Acute bilateral central pulmonary emboli. Positive for acute PE with CT evidence of right heart strain (RV/LV Ratio = 1.2) consistent with at least submassive (intermediate risk) PE. The presence of right heart strain has been associated with an increased risk of morbidity and mortality. 2. Cardiomegaly. 3. Bilateral hypoventilatory changes, with scattered peripheral areas of consolidation concerning for developing pulmonary infarcts. Critical Value/emergent results were called by telephone at the time of interpretation on 04/16/2020 at 12:05 am to provider Dr. Ander Slade, who verbally acknowledged these results. Electronically Signed   By: Randa Ngo M.D.   On: 04/16/2020 00:11   CARDIAC CATHETERIZATION  Result Date: 04/15/2020 Findings: On milrinone 0.125 mcg/kg/min Ao =125/92 (109) LV = 122/25 RA =  15 RV = 63/20 PA = 67/32 (46) PCW = 22 (v=28) Fick cardiac output/index = 5.0/2.0 PVR = 6.0 WU SVR = 1513 Ao sat = 97% PA sat = 66%, 67% PAPi = 2.33 Assessment: 1. Normal coronary arteries 2. 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 Moderate mixed pulmonary venous HTN 4. Elevated filling pressures 5. Marginal CI on milrinone Plan/Discussion: Continue medical therapy. Glori Bickers, MD 4:12 PM   DG CHEST PORT 1 VIEW  Result Date: 04/15/2020 CLINICAL DATA:  Chest pain at rest. EXAM: PORTABLE CHEST 1 VIEW COMPARISON:  Radiograph 04/12/2020 FINDINGS: Right upper extremity PICC tip at the atrial caval junction. Lower lung volumes from prior exam leading to bronchovascular crowding. Cardiomegaly. No convincing pulmonary edema or pleural effusion. No pneumothorax. Elevation of right hemidiaphragm. IMPRESSION: Cardiomegaly. Lower lung volumes from prior exam leading to bronchovascular crowding. Electronically Signed   By: Keith Rake M.D.   On: 04/15/2020 18:35   MR CARDIAC MORPHOLOGY W WO CONTRAST  Result Date: 04/15/2020 CLINICAL DATA:  Clinical question of cardiomyopathy and left ventricular mass 50  year old Male Study assumes HCT of 77 EXAM: CARDIAC MRI TECHNIQUE: The patient was scanned on a 1.5 Tesla GE magnet. A dedicated cardiac coil was used. Functional imaging was done using Fiesta sequences. 2,3, and 4 chamber views were done to assess for RWMA's. Modified Simpson's rule using a short axis stack was used to calculate an ejection fraction on a dedicated work Conservation officer, nature. The patient received 10 cc of Gadavist. After 10 minutes inversion recovery sequences were used to assess for infiltration and scar tissue. CONTRAST:  10 cc  of Gadavist FINDINGS: 1. Severe left ventricular dilation, with LVEDD 71 mm, and LVEDVi 130 mL/m2. Normal left ventricular thickness, with intraventricular septal thickness of 7 mm, posterior wall thickness of 5 mm. Severe left ventricular systolic dysfunction (LVEF =12%). There is severe global hypokinesis with apical dyskinesis. Left ventricular parametric mapping notable for diffuse increase in ECV signal in the apex (30-40%). Normal T2 signal. There is no late gadolinium enhancement in the  left ventricular myocardium. There is a density 26 mm X 23 mm X 10.5 mm in the left ventricular apex that does not perfuse with contrast consistent with LV thrombus. 2. Severe right ventricular dilation with RVEDVI 104 mL/m2. Normal right ventricular thickness. Severe right ventricular systolic dysfunction (RVEF =21 %). Severe global ventricular hypokinesis with apical akinesis. 3.  Normal size of the left and moderate right atrial dilation: 4. Normal size of the aortic root, ascending aorta. Pulmonary artery is mildly dilated at 40 mm. 5. Tricuspid regurgitation and mitral regurgitation is visualized qualitatively. 6.  Normal pericardium.  No pericardial effusion. 7. Grossly, no extracardiac findings. Recommended dedicated study if concerned for non-cardiac pathology. IMPRESSION: 1.  Severe left ventricular dilation. 2. Severe left ventricular systolic dysfunction (LVEF  =12%). There is severe global hypokinesis with apical dyskinesis. 3.  Diffuse increase in ECV signal in the apex (30-40%). 4.  There is likely an LV thrombus. 5.  Severe right ventricular dilation. 6. Severe right ventricular systolic dysfunction (RVEF =21 %). Severe global ventricular hypokinesis with apical akinesis. 7.  Moderate right atrial dilation 8.  Pulmonary artery is mildly dilated at 40 mm 9. Tricuspid regurgitation and mitral regurgitation is visualized qualitatively. Study consistent with dilated biventricular cardiomyopathy with LV thrombus Rudean Haskell MD Electronically Signed   By: Rudean Haskell MD   On: 04/15/2020 14:29    Anti-infectives: Anti-infectives (From admission, onward)   Start     Dose/Rate Route Frequency Ordered Stop   04/12/20 1600  piperacillin-tazobactam (ZOSYN) IVPB 3.375 g        3.375 g 12.5 mL/hr over 240 Minutes Intravenous Every 8 hours 04/12/20 1446         Assessment/Plan HTN GERD  Gastric polyps s/p biopsies on EGD 04/09/20 Bilateral renal cysts - noted on CT 3/10 New HF with new EF <20%and LV dysfunction New LV thrombus R femoral vein DVT B/l PE's  - Defer to [facility] and cardiology -  Acutecholecystitis Pancreatitis - [place] at 152 Intractable n/v  - HIDA positive for acute cholecystitis. He did not have stones on [place] but did have sludge - Given patients new HF with EF < 20%, DVT, b/l PE's, LV thrombus cardiology feels hiscardiac status very tenuous and high risk forGBsurgery. - Consider GI consult if patients Lipase/LFT's/ T. Bili continue to remain elevated or were to uptrend - Patient LFT's currently stable. Lipase downtrending. His abdominal pain, n/v have resolved. He is NT on exam and tolerating PO's. Our team will sign off. Recommend 10 day course total of abx. If patient was to worsen (develop abdominal pain, n/v, intolerance to PO's, become febrile, develop leukocytosis or worsening LFT's) please let our team  know so we can determine if patient would need IR percutaneous cholecystectomy Continue to trend labs.  FEN -NPOfor procedure (okay for [place] diet after procedure), IVF per primary VTE -SCDs, heparin gtt  ID -[id number] >>     LOS: 4 days    Jillyn Ledger , [facility] 04/16/2020, 9:53 AM Please see Amion for pager number during day hours 7:00am-4:30pm

## 2020-04-16 NOTE — Progress Notes (Signed)
Arrived to relieve Kevin Caprice, RN for a break. Patient procedure in completed. Sheaths pulled at 1440. Pressure being held to the left groin sites (venus and arterial on left) and sutures placed on the right groin site by Dr. Serafina Royals. Patient continues to be managed by multidisciplinary team including anesthesia. Patient alert and reporting he is feeling better.

## 2020-04-16 NOTE — Progress Notes (Signed)
NAME:  Kevin Baxter, MRN:  [id number], DOB:  [birth year], LOS: 4 ADMISSION DATE:  04/11/2020, CONSULTATION DATE:  3/18 REFERRING MD:  Hospitalist, CHIEF COMPLAINT:  Shortness of breath   History of Present Illness:  Kevin Baxter is a 50 y.o.M who presented to [facility] on 3/13 with reports of, GERD, bloating, N/V since January 2022.  In Jan 2022 he was admitted with abdominal pain, N/V.  CT imaging at that time was worrisome for possible pancreatitis with CT imaging notable for expansion of the pancreatic tail, adjacent fat stranding and edema (lipase in 70-80's), no gallstones. Of note he has been using Fever Few, [place] and [place] as supplements.  He was treated conservatively and discharged home.  He has been dealing with depression since at least January. No hx of smoking, ETOH or illicit drugs.  He followed up with primary care and was treated for GERD.    He was seen again in the ER on 3/3 for upper chest pain and difficulty swallowing. Prior EGD in 05/2019 showed esophagitis (no reports of difficulty with anesthesia).  He was treated for GERD with improvement in acute symptoms. Of note CXR at that time demonstrated cardiomegaly and mild vascular congestion.    The patient underwent EGD on 3/10 and had difficulties waking from anesthesia and loss of vision and was sent to the ER for evaluation.  CT head imaging negative at that time.  CT ABD/Pelvis 3/10 showed near complete resolution of prior pancreatic findings.  On 3/13 he returned to the ER with N/V.  They associated it with post EGD.  He called the answering service and referred to the ER.  Initial evaluation notable for tachycardia, mildly elevated AST/ALT/lipase.  US of the abdomen was assessed which demonstrated GB sludge / wall thickening. NM HIDA scan was concerning for possible acute cholecystitis. GI / CCS consulted. He was diagnosed with biventricular heart failure, and acute right femoral DVT.  The heart failure team was consulted,  cardiac MRI revealed LV thrombus and severe RV dilation.  He was started on heparin and plan was for right heart cath.  He began having chest pain on 3/17 and CTA chest with acute central pulmonary emboli.  He remained short of breath and tachycardic with evidence of right heart strain, RV/LV ratio 1.2. R/LHC with normal coronaries, RV strain, PA 67/32, PCW 22.  Pt on milrinone for cath.  He had mild hemoptysis, likely in setting of PE.  IR consulted for possible thrombectomy.     Pertinent  Medical History  Gout - on allopurinol HTN - on losartan  Tadalafil  Anxiety / Depression  GERD, colon polyps  Significant Hospital Events: Including procedures, antibiotic start and stop dates in addition to other pertinent events   . 3/14 admit to hospitalists, DVT of the right femoral vein, started on heparin, started on Zosyn . 3/17 right heart cath with normal coronary arteries, severe and ICM EF 15%, moderate pulmonary hypertension . 3/18 new onset chest pain, found to have central submassive PE, PCCM consult  Interim History / Subjective:  Pt denies acute complaints, feels anxious about the impending procedures  Afebrile  Tachycardic   Objective   Blood pressure 110/88, pulse (!) 125, temperature 97.9 F (36.6 C), temperature source Oral, resp. rate (!) 24, height 6\' 1"  (1.854 m), weight 124.9 kg, SpO2 94 %. CVP:  [8 mmHg-16 mmHg] 15 mmHg      Intake/Output Summary (Last 24 hours) at 04/16/2020 1137 Last data filed at  04/16/2020 0932 Gross per 24 hour  Intake 1314.97 ml  Output 2825 ml  Net -1510.03 ml   Filed Weights   04/14/20 0622 04/15/20 0623 04/16/20 0534  Weight: 135.3 kg 127.2 kg 124.9 kg    General: chronically ill appearing adult male lying in bed in NAD, family at bedside  PSY: flat affect, poor eye contact  HEENT: MM pink/moist, mild icterus, no bruit Neuro: AAOx4, speech clear, MAE  CV: s1s2 RRR, tachycardic, ?gallop, no murmurs PULM: non-labored on 3L, lungs  bilaterally clear GI: soft, bsx4 active  Extremities: warm/dry, no edema  Skin: no rashes or lesions   Labs/imaging personally reviewed   CTA chest, abd [place], prior CXR, labs reviewed with Dr. Ruthann Cancer  [facility] Problem list     Assessment & Plan:   Acute Central Submassive Pulmonary Embolism, Femoral DVT Hemoptysis  With tachycardia, anemia and LVEF of 15% right heart strain, PESI score IV.  Likely in setting of immobility. Question if this is more subacute given size and hemodynamic stability. Hemoptysis likely secondary to PE.  -appreciate IR / CHF team assistance with patient care -pending thrombectomy per IR  -continue heparin gtt  -will likely need to be observed in ICU post procedure  -if change in hemodynamics, consider tPA -follow up autoimmune panel, will need repeat as outpatient   Acute biventricular heart failure with LV thrombus, atrial fibrillation Heart failure team following, s/p right heart cath. No prior imaging but 04/01/20 CXR suggestive of cardiomegaly.  NYHA class III symptoms -per CHF team  -continue milrinone, pending hemodynamics post thrombectomy, may need to hold  -follow Co-Ox -continue digoxin -defer diuresis to CHF  -full dose anticoagulation   AKI Likely in setting of relative hypoperfusion with low EF -Trend BMP / urinary output -Replace electrolytes as indicated -Avoid nephrotoxic agents, ensure adequate renal perfusion  Questionable Acute Cholecystitis, Pancreatitis, Elevated LFTs HIDA positive for acute cholecystitis, no stones but did have sludge > symptoms may be related to CHF / congestion -appreciate CCS -follow LFT's, lipase   Gout -continue allopurinol for now, monitor renal function   Depression / Anxiety  -continue wellbutrin     Best practice (evaluated daily)  Diet:  NPO Pain/Anxiety/Delirium protocol (if indicated): No VAP protocol (if indicated): Not indicated DVT prophylaxis: Systemic AC GI prophylaxis:  N/A Glucose control:  SSI No Central venous access:  N/A Arterial line:  N/A Foley:  N/A Mobility:  bed rest  PT consulted: N/A Last date of multidisciplinary goals of care discussion [per primary] Code Status:  full code Disposition: Progressive, likely to ICU post procedure.   Labs   CBC: Recent Labs  Lab 04/09/20 1301 04/11/20 1140 04/11/20 1841 04/13/20 1040 04/14/20 0713 04/15/20 0659 04/15/20 1527 04/15/20 1530 04/16/20 0445  WBC 6.9 6.9  --  7.3 7.3 7.5  --   --  8.1  NEUTROABS 5.6 4.9  --   --   --   --   --   --   --   HGB 16.0 16.2   < > 15.5 15.6 16.3 18.4*  18.0* 17.3* 16.7  HCT 50.8 50.2   < > 48.7 48.9 49.4 54.0*  53.0* 51.0 51.7  MCV 80.5 79.4*  --  79.4* 79.1* 77.4*  --   --  78.3*  PLT 175 146*  --  108* 149* 175  --   --  189   < > = values in this interval not displayed.    Basic Metabolic Panel: Recent Labs  Lab  04/09/20 1301 04/11/20 1140 04/12/20 0027 04/13/20 0041 04/14/20 0712 04/14/20 1850 04/15/20 0659 04/15/20 1527 04/15/20 1530 04/16/20 0446  NA 138 135   < > 136 139 138 139 140  140 142 136  K 4.7 4.5   < > 4.9 4.3 3.5 3.8 3.4*  3.3* 3.2* 3.9  CL 105 102   < > 102 101 94* 90*  --   --  89*  CO2 23 21*   < > 26 27 33* 34*  --   --  35*  GLUCOSE 119* 125*   < > 126* 114* 140* 116*  --   --  134*  BUN 18 24*   < > 20 24* 24* 24*  --   --  27*  CREATININE 1.14 1.12   < > 1.26* 1.31* 1.28* 1.40*  --   --  1.40*  CALCIUM 8.3* 8.6*   < > 8.6* 9.1 8.8* 8.8*  --   --  9.0  MG 2.0 2.0  --   --   --   --   --   --   --   --   PHOS  --   --   --   --  4.9*  --  5.2*  --   --  4.5   < > = values in this interval not displayed.   GFR: Estimated Creatinine Clearance: 86.4 mL/min (A) (by C-G formula based on SCr of 1.4 mg/dL (H)). Recent Labs  Lab 04/11/20 1841 04/11/20 2111 04/13/20 1040 04/14/20 0713 04/15/20 0659 04/16/20 0445  WBC  --   --  7.3 7.3 7.5 8.1  LATICACIDVEN 2.2* 2.6*  --   --   --   --     Liver Function  Tests: Recent Labs  Lab 04/12/20 0027 04/13/20 0041 04/14/20 0712 04/14/20 0713 04/15/20 0659 04/16/20 0445 04/16/20 0446  AST 144* 104*  --  62* 56* 52*  --   ALT 172* 169*  --  132* 111* 92*  --   ALKPHOS 59 59  --  61 63 65  --   BILITOT 4.0* 4.5*  --  4.6* 5.0* 5.0*  --   PROT 6.6 6.1*  --  6.4* 6.8 7.0  --   ALBUMIN 2.9* 2.5* 2.6* 2.6* 2.7*  2.6* 2.6* 2.6*   Recent Labs  Lab 04/12/20 0027 04/13/20 0041 04/14/20 0713 04/15/20 0659 04/16/20 0445  LIPASE 155* 201* 142* 165* 152*  AMYLASE 159*  --   --   --   --    No results for input(s): AMMONIA in the last 168 hours.  ABG    Component Value Date/Time   PHART 7.498 (H) 04/15/2020 1530   PCO2ART 46.7 04/15/2020 1530   PO2ART 88 04/15/2020 1530   HCO3 36.2 (H) 04/15/2020 1530   TCO2 38 (H) 04/15/2020 1530   O2SAT 69.0 04/16/2020 0441     Coagulation Profile: Recent Labs  Lab 04/12/20 1552 04/16/20 0515  INR 1.9* 1.6*    Cardiac Enzymes: No results for input(s): CKTOTAL, CKMB, CKMBINDEX, TROPONINI in the last 168 hours.  HbA1C: Hgb A1c MFr Bld  Date/Time Value Ref Range Status  04/11/2020 06:44 PM 6.3 (H) 4.8 - 5.6 % Final    Comment:    (NOTE) Pre diabetes:          5.7%-6.4%  Diabetes:              >6.4%  Glycemic control for   <7.0% adults with diabetes  12/03/2018 04:18 PM 6.2 4.6 - 6.5 % Final    Comment:    Glycemic Control Guidelines for People with Diabetes:Non Diabetic:  <6%Goal of Therapy: <7%Additional Action Suggested:  >8%     CBG: No results for input(s): GLUCAP in the last 168 hours.    Critical care time: 41 minutes     Noe Gens, MSN, APRN, NP-C, AGACNP-BC [facility] 04/16/2020, 11:37 AM   Please see [email or web address] for pager details.   From 7A-7P if no response, please call [phone] After hours, please call ELink [phone]

## 2020-04-16 NOTE — Progress Notes (Signed)
Radiologist called to discuss central submassive PE with right heart strain. Advised that IR may want to do thrombolysis. I called Dr. Serafina Royals, who reports that the patient sounds low/intermediate risk, but it would be worth reaching out to CCM. I called the CCM consult line and spoke with Dr. Tacy Learn who reports that if patient's vitals change, they would be happy to see him, but at this time, urgent evaluation is not necessary. Patient currently maintaining O2 sats mid 90s on 3L [place]. He remains tachycardic, but BP is stable. Patient is on heparin infusion already for DVT. Will continue to monitor.

## 2020-04-16 NOTE — Consult Note (Signed)
NAME:  Kevin Baxter, MRN:  [id number], DOB:  1970-02-20, LOS: 4 ADMISSION DATE:  04/11/2020, CONSULTATION DATE:  04/16/20 REFERRING MD:  Hospitalist, CHIEF COMPLAINT:  Shortness of breath   History of Present Illness:  Mr. Kevin Baxter is a 50 y.o.M with bi-ventricular HF with EF of less than 20%, poorly controlled hypertension obesity, GERD who was admitted 3/13, initially with nausea and vomiting post EGD.  He was diagnosed with by-ventricular heart failure, acute cholecystitis, possible pancreatitis and acute DVT.  The heart failure team was consulted, cardiac MRI revealed LV thrombus and severe RV dilation.  He was started on heparin and plan was for right heart cath.  He began having running chest pain on 3/17 and CTA chest with acute central pulmonary emboli.  He remained short of breath and tachycardic with evidence of right heart strain, he was not hypotensive and remained alert and oriented.  PCCM consulted in the setting.  Pertinent  Medical History   has a past medical history of Allergy, Anxiety, Arthritis, Fundic gland polyposis of stomach, GERD with esophagitis, Gout, adenomatous colonic polyps, and Hypertension.   Significant Hospital Events: Including procedures, antibiotic start and stop dates in addition to other pertinent events   . 3/14 admit to hospitalists, DVT of the right femoral vein, started on heparin, started on Zosyn . 3/17 right heart cath with normal coronary arteries, severe and ICM EF 15%, moderate pulmonary hypertension . 3/18 new onset chest pain, found to have central submassive PE, PCCM consult  Interim History / Subjective:  Patient has stable blood pressure, but heart rate in the 130s and chest pain with diaphoresis.  Will likely need catheter directed thrombolytics versus thrombectomy.  Hospitalist to speak with IR  Objective   Blood pressure (!) 143/111, pulse (!) 129, temperature 98.3 F (36.8 C), temperature source Oral, resp. rate (!) 26, height 6'  1" (1.854 m), weight 127.2 kg, SpO2 93 %. CVP:  [8 mmHg-16 mmHg] 16 mmHg      Intake/Output Summary (Last 24 hours) at 04/16/2020 0315 Last data filed at 04/15/2020 2000 Gross per 24 hour  Intake 2377.52 ml  Output 4700 ml  Net -2322.48 ml   Filed Weights   04/11/20 1123 04/14/20 0622 04/15/20 0623  Weight: 135.2 kg 135.3 kg 127.2 kg    General: Overweight male, awake, mild distress HEENT: MM pink/moist, nasal cannula in place Neuro: Awake, alert, oriented and following commands CV: s1s2 tachycardic and irregular, no m/r/g PULM: Tachypneic on 3 L nasal cannula, slight crackles left lower lobe, no wheezing or rhonchi GI: soft, bsx4 active  Extremities: warm/dry, no edema  Skin: no rashes or lesions   Labs/imaging personally reviewed   CTA chest, reviewed with Dr. Tacy Learn  [facility] Problem list     Assessment & Plan:   Acute central submassive pulmonary embolism, DVT With tachycardia, anemia and LVEF of 15% right heart strain, PESI score IV P: - recommend IR consult for catheter directed thrombolysis versus thrombectomy.  Patient is in agreement with plan -May need transfer to ICU postprocedure -Continue close monitoring, if blood pressure drops would need systemic TPA  Acute biventricular heart failure with LV thrombus, atrial fibrillation Heart failure team following, s/p right heart cath NYHA class III symptoms P: -Management per heart failure, remains on Lasix and milrinone drips -Continue digoxin, spironolactone and aspirin -On heparin for thrombus  AKI Slowly uptrend during this hospitalization, creatinine 1.4 P: -Continue monitoring renal indices and electrolytes, -10 L since admission, monitor volume status  Acute cholecystitis, pancreatitis, elevated LFTs HIDA positive for acute cholecystitis, no stones but did have sludge P: -Evaluated by surgery and plan is to monitor and treat with Zosyn -LFTs improving -May need percutaneous cholecystectomy  if cholecystitis worsens    Best practice (evaluated daily)  Diet:  NPO Pain/Anxiety/Delirium protocol (if indicated): No VAP protocol (if indicated): Not indicated DVT prophylaxis: Subcutaneous Heparin GI prophylaxis: N/A Glucose control:  SSI No Central venous access:  N/A Arterial line:  N/A Foley:  N/A Mobility:  bed rest  PT consulted: N/A Last date of multidisciplinary goals of care discussion [per primary] Code Status:  full code Disposition: Progressive  Labs   CBC: Recent Labs  Lab 04/09/20 1301 04/11/20 1140 04/11/20 1841 04/13/20 1040 04/14/20 0713 04/15/20 0659 04/15/20 1527 04/15/20 1530  WBC 6.9 6.9  --  7.3 7.3 7.5  --   --   NEUTROABS 5.6 4.9  --   --   --   --   --   --   HGB 16.0 16.2   < > 15.5 15.6 16.3 18.4*  18.0* 17.3*  HCT 50.8 50.2   < > 48.7 48.9 49.4 54.0*  53.0* 51.0  MCV 80.5 79.4*  --  79.4* 79.1* 77.4*  --   --   PLT 175 146*  --  108* 149* 175  --   --    < > = values in this interval not displayed.    Basic Metabolic Panel: Recent Labs  Lab 04/09/20 1301 04/11/20 1140 04/12/20 0027 04/13/20 0041 04/14/20 0712 04/14/20 1850 04/15/20 0659 04/15/20 1527 04/15/20 1530  NA 138 135 136 136 139 138 139 140  140 142  K 4.7 4.5 4.6 4.9 4.3 3.5 3.8 3.4*  3.3* 3.2*  CL 105 102 105 102 101 94* 90*  --   --   CO2 23 21* 20* 26 27 33* 34*  --   --   GLUCOSE 119* 125* 120* 126* 114* 140* 116*  --   --   BUN 18 24* 23* 20 24* 24* 24*  --   --   CREATININE 1.14 1.12 0.91 1.26* 1.31* 1.28* 1.40*  --   --   CALCIUM 8.3* 8.6* 8.5* 8.6* 9.1 8.8* 8.8*  --   --   MG 2.0 2.0  --   --   --   --   --   --   --   PHOS  --   --   --   --  4.9*  --  5.2*  --   --    GFR: Estimated Creatinine Clearance: 87.2 mL/min (A) (by C-G formula based on SCr of 1.4 mg/dL (H)). Recent Labs  Lab 04/11/20 1140 04/11/20 1841 04/11/20 2111 04/13/20 1040 04/14/20 0713 04/15/20 0659  WBC 6.9  --   --  7.3 7.3 7.5  LATICACIDVEN  --  2.2* 2.6*  --   --    --     Liver Function Tests: Recent Labs  Lab 04/11/20 1140 04/12/20 0027 04/13/20 0041 04/14/20 0712 04/14/20 0713 04/15/20 0659  AST 120* 144* 104*  --  62* 56*  ALT 134* 172* 169*  --  132* 111*  ALKPHOS 64 59 59  --  61 63  BILITOT 3.9* 4.0* 4.5*  --  4.6* 5.0*  PROT 6.8 6.6 6.1*  --  6.4* 6.8  ALBUMIN 2.9* 2.9* 2.5* 2.6* 2.6* 2.7*  2.6*   Recent Labs  Lab 04/11/20 1841 04/12/20 0027 04/13/20 0041 04/14/20 1224  04/15/20 0659  LIPASE 135* 155* 201* 142* 165*  AMYLASE  --  159*  --   --   --    No results for input(s): AMMONIA in the last 168 hours.  ABG    Component Value Date/Time   PHART 7.498 (H) 04/15/2020 1530   PCO2ART 46.7 04/15/2020 1530   PO2ART 88 04/15/2020 1530   HCO3 36.2 (H) 04/15/2020 1530   TCO2 38 (H) 04/15/2020 1530   O2SAT 66.7 04/15/2020 1815     Coagulation Profile: Recent Labs  Lab 04/12/20 1552  INR 1.9*    Cardiac Enzymes: No results for input(s): CKTOTAL, CKMB, CKMBINDEX, TROPONINI in the last 168 hours.  HbA1C: Hgb A1c MFr Bld  Date/Time Value Ref Range Status  04/11/2020 06:44 PM 6.3 (H) 4.8 - 5.6 % Final    Comment:    (NOTE) Pre diabetes:          5.7%-6.4%  Diabetes:              >6.4%  Glycemic control for   <7.0% adults with diabetes   12/03/2018 04:18 PM 6.2 4.6 - 6.5 % Final    Comment:    Glycemic Control Guidelines for People with Diabetes:Non Diabetic:  <6%Goal of Therapy: <7%Additional Action Suggested:  >8%     CBG: No results for input(s): GLUCAP in the last 168 hours.  Review of Systems:   Negative except as noted in HPI  Past Medical History:  He,  has a past medical history of Allergy, Anxiety, Arthritis, Fundic gland polyposis of stomach, GERD with esophagitis, Gout, adenomatous colonic polyps, and Hypertension.   Surgical History:   Past Surgical History:  Procedure Laterality Date  . COLONOSCOPY  05/2019  . ESOPHAGOGASTRODUODENOSCOPY  05/2019  . HAND SURGERY Right    2006 for a FX      Social History:   reports that he has never smoked. He has never used smokeless tobacco. He reports current alcohol use. He reports that he does not use drugs.   Family History:  His family history includes Cancer in his mother; Diabetes in his cousin, paternal aunt, sister, and sister; Heart failure in his father; Hypertension in his father. There is no history of Colon cancer, Prostate cancer, Stroke, Esophageal cancer, Rectal cancer, or Stomach cancer.   Allergies Allergies  Allergen Reactions  . Pseudoephedrine     REACTION: jittery     Home Medications  Prior to Admission medications   Medication Sig Start Date End Date Taking? Authorizing Provider  allopurinol (ZYLOPRIM) 300 MG tablet Take 1 tablet (300 mg total) by mouth daily. 12/22/19  Yes Paz, Alda Berthold, MD  buPROPion (WELLBUTRIN) 100 MG tablet Take 1 tablet a day x 1 week, then 1 tab BID Patient taking differently: Take 100 mg by mouth 2 (two) times daily. 03/02/20  Yes Paz, Alda Berthold, MD  dicyclomine (BENTYL) 20 MG tablet Take 1 tablet (20 mg total) by mouth every 6 (six) hours as needed for spasms. Patient taking differently: Take 20 mg by mouth daily. 04/22/19  Yes Gatha Mayer, MD  losartan (COZAAR) 50 MG tablet Take 2 tablets (100 mg total) by mouth daily. 04/08/20  Yes Paz, Alda Berthold, MD  ondansetron (ZOFRAN ODT) 4 MG disintegrating tablet Take 1 tablet (4 mg total) by mouth every 8 (eight) hours as needed for nausea or vomiting. 04/09/20  Yes Carmin Muskrat, MD  sucralfate (CARAFATE) 1 g tablet Take 1 tablet (1 g total) by mouth 4 (four) times  daily -  with meals and at bedtime. Patient taking differently: Take 1 g by mouth daily. 04/09/20  Yes Carmin Muskrat, MD  pantoprazole (PROTONIX) 40 MG tablet Take 1 tablet (40 mg total) by mouth daily. Patient not taking: No sig reported 03/22/20   Shelda Pal, DO     Critical care time: 40 minutes       CRITICAL CARE Performed by: Otilio Carpen Gleason   Total  critical care time: 40 minutes  Critical care time was exclusive of separately billable procedures and treating other patients.  Critical care was necessary to treat or prevent imminent or life-threatening deterioration.  Critical care was time spent personally by me on the following activities: development of treatment plan with patient and/or surrogate as well as nursing, discussions with consultants, evaluation of patient's response to treatment, examination of patient, obtaining history from patient or surrogate, ordering and performing treatments and interventions, ordering and review of laboratory studies, ordering and review of radiographic studies, pulse oximetry and re-evaluation of patient's condition.  Otilio Carpen Gleason, PA-C [facility] See Amion for pager If no response to pager , please call [phone] until 7pm After 7:00 pm call Elink  [phone]?[place]

## 2020-04-16 NOTE — Anesthesia Preprocedure Evaluation (Addendum)
Anesthesia Evaluation  Patient identified by MRN, date of birth, ID band Patient awake    Reviewed: Allergy & Precautions, NPO status , Patient's Chart, lab work & pertinent test results  History of Anesthesia Complications (+) PROLONGED EMERGENCE and history of anesthetic complications  Airway Mallampati: III  TM Distance: >3 FB Neck ROM: Full    Dental  (+) Dental Advisory Given   Pulmonary PE Covid-19 Nucleic Acid Test Results Lab Results      Component                Value               Date                      [place]              NEGATIVE            04/11/2020                [place]              NEGATIVE            02/26/2020                [place]                                  05/30/2019            RESULT: NEGATIVE     + decreased breath sounds      Cardiovascular hypertension, Pt. on medications +CHF   Rhythm:Regular Rate:Tachycardia  1. There is a large apical thromus measuring 3.6 x 2.3 cm .   Marland Kitchen Left ventricular ejection fraction, by estimation, is <20%. The left  ventricle has severely decreased function. The left ventricle demonstrates  regional wall motion abnormalities (see scoring diagram/findings for  description). The left ventricular  internal cavity size was moderately to severely dilated. Left ventricular  diastolic function could not be evaluated. There is severe akinesis of the  left ventricular, mid-apical apical segment, anteroseptal wall,  inferoseptal wall and lateral wall.  2. Right ventricular systolic function was not well visualized. The right  ventricular size is not well visualized.  3. The mitral valve is grossly normal. No evidence of mitral valve  regurgitation.  4. The aortic valve is normal in structure. Aortic valve regurgitation is  not visualized. No aortic stenosis is present.  5. Aortic dilatation noted. There is borderline dilatation of the  ascending  aorta, measuring 39 mm.    Neuro/Psych PSYCHIATRIC DISORDERS Anxiety Depression    GI/Hepatic GERD  Medicated and Controlled,Lab Results      Component                Value               Date                      ALT                      92 (H)              04/16/2020                AST  52 (H)              04/16/2020                ALKPHOS                  65                  04/16/2020                BILITOT                  5.0 (H)             04/16/2020              Endo/Other  Lab Results      Component                Value               Date                      HGBA1C                   6.3 (H)             04/11/2020             Renal/GU Renal diseaseLab Results      Component                Value               Date                      CREATININE               1.40 (H)            04/16/2020                Musculoskeletal  (+) Arthritis ,   Abdominal   Peds  Hematology  (+) Blood dyscrasia, anemia , Lab Results      Component                Value               Date                      WBC                      8.1                 04/16/2020                HGB                      16.7                04/16/2020                HCT                      51.7                04/16/2020                MCV  78.3 (L)            04/16/2020                PLT                      189                 04/16/2020              Anesthesia Other Findings   Reproductive/Obstetrics                            Anesthesia Physical Anesthesia Plan  ASA: IV  Anesthesia Plan: MAC   Post-op Pain Management:    Induction: Intravenous  PONV Risk Score and Plan: 1 and Treatment may vary due to age or medical condition  Airway Management Planned: Nasal Cannula and Mask  Additional Equipment:   Intra-op Plan:   Post-operative Plan:   Informed Consent: I have reviewed the patients History and Physical, chart, labs  and discussed the procedure including the risks, benefits and alternatives for the proposed anesthesia with the patient or authorized representative who has indicated his/her understanding and acceptance.     Dental advisory given  Plan Discussed with: CRNA and Surgeon  Anesthesia Plan Comments:         Anesthesia Quick Evaluation

## 2020-04-16 NOTE — Progress Notes (Signed)
Advanced Heart Failure Team Rounding Note   Primary Physician: Kathlene November Primary Cardiologist:  Fransico Him  Reason for Consultation: Biventricular CHF  HPI:    Cath 3/17 with normal cor. RV strain on RHC.   Post cath developed CP and hemoptysis. CT with submassive bilateral PE and RUL and RML infarcts.   Continues with hemoptysis. + SOB. HR 130s    Objective:    Vital Signs:   Temp:  [97.8 F (36.6 C)-98.7 F (37.1 C)] 97.9 F (36.6 C) (03/18 0425) Pulse Rate:  [121-130] 125 (03/18 0425) Resp:  [12-26] 24 (03/18 0425) BP: (115-152)/(84-111) 115/91 (03/18 0425) SpO2:  [5 %-98 %] 95 % (03/18 0425) Weight:  [124.9 kg] 124.9 kg (03/18 0534) Last BM Date: 04/13/20  Weight change: Filed Weights   04/14/20 0622 04/15/20 0623 04/16/20 0534  Weight: 135.3 kg 127.2 kg 124.9 kg    Intake/Output:   Intake/Output Summary (Last 24 hours) at 04/16/2020 1015 Last data filed at 04/16/2020 0932 Gross per 24 hour  Intake 1314.97 ml  Output 2825 ml  Net -1510.03 ml      Physical Exam    General:  Mild SOB HEENT: normal Neck: supple. + JVP. Carotids 2+ bilat; no bruits. No lymphadenopathy or thryomegaly appreciated. Cor: PMI nondisplaced. Regular tachy + s3 Lungs: coars Abdomen: soft, nontender, nondistended. No hepatosplenomegaly. No bruits or masses. Good bowel sounds. Extremities: no cyanosis, clubbing, rash,tr edema Neuro: alert & orientedx3, cranial nerves grossly intact. moves all 4 extremities w/o difficulty. Affect pleasant  Telemetry   Sinus tach 120-130 Personally reviewed   Labs   Basic Metabolic Panel: Recent Labs  Lab 04/09/20 1301 04/11/20 1140 04/12/20 0027 04/13/20 0041 04/14/20 0712 04/14/20 1850 04/15/20 0659 04/15/20 1527 04/15/20 1530 04/16/20 0446  NA 138 135   < > 136 139 138 139 140  140 142 136  K 4.7 4.5   < > 4.9 4.3 3.5 3.8 3.4*  3.3* 3.2* 3.9  CL 105 102   < > 102 101 94* 90*  --   --  89*  CO2 23 21*   < > 26 27 33* 34*   --   --  35*  GLUCOSE 119* 125*   < > 126* 114* 140* 116*  --   --  134*  BUN 18 24*   < > 20 24* 24* 24*  --   --  27*  CREATININE 1.14 1.12   < > 1.26* 1.31* 1.28* 1.40*  --   --  1.40*  CALCIUM 8.3* 8.6*   < > 8.6* 9.1 8.8* 8.8*  --   --  9.0  MG 2.0 2.0  --   --   --   --   --   --   --   --   PHOS  --   --   --   --  4.9*  --  5.2*  --   --  4.5   < > = values in this interval not displayed.    Liver Function Tests: Recent Labs  Lab 04/12/20 0027 04/13/20 0041 04/14/20 0712 04/14/20 0713 04/15/20 0659 04/16/20 0445 04/16/20 0446  AST 144* 104*  --  62* 56* 52*  --   ALT 172* 169*  --  132* 111* 92*  --   ALKPHOS 59 59  --  61 63 65  --   BILITOT 4.0* 4.5*  --  4.6* 5.0* 5.0*  --   PROT 6.6 6.1*  --  6.4* 6.8 7.0  --   ALBUMIN 2.9* 2.5* 2.6* 2.6* 2.7*  2.6* 2.6* 2.6*   Recent Labs  Lab 04/12/20 0027 04/13/20 0041 04/14/20 0713 04/15/20 0659 04/16/20 0445  LIPASE 155* 201* 142* 165* 152*  AMYLASE 159*  --   --   --   --    No results for input(s): AMMONIA in the last 168 hours.  CBC: Recent Labs  Lab 04/09/20 1301 04/11/20 1140 04/11/20 1841 04/13/20 1040 04/14/20 0713 04/15/20 0659 04/15/20 1527 04/15/20 1530 04/16/20 0445  WBC 6.9 6.9  --  7.3 7.3 7.5  --   --  8.1  NEUTROABS 5.6 4.9  --   --   --   --   --   --   --   HGB 16.0 16.2   < > 15.5 15.6 16.3 18.4*  18.0* 17.3* 16.7  HCT 50.8 50.2   < > 48.7 48.9 49.4 54.0*  53.0* 51.0 51.7  MCV 80.5 79.4*  --  79.4* 79.1* 77.4*  --   --  78.3*  PLT 175 146*  --  108* 149* 175  --   --  189   < > = values in this interval not displayed.    Cardiac Enzymes: No results for input(s): CKTOTAL, CKMB, CKMBINDEX, TROPONINI in the last 168 hours.  BNP: BNP (last 3 results) Recent Labs    04/12/20 1216 04/16/20 0406  BNP 1,153.9* 885.4*    ProBNP (last 3 results) No results for input(s): PROBNP in the last 8760 hours.   CBG: No results for input(s): GLUCAP in the last 168 hours.  Coagulation  Studies: Recent Labs    04/16/20 0515  LABPROT 18.5*  INR 1.6*     Imaging   CT ANGIO CHEST PE W OR WO CONTRAST  Result Date: 04/16/2020 CLINICAL DATA:  Chest pain, left ventricular thrombus on MRI, cardiac dysfunction EXAM: CT ANGIOGRAPHY CHEST WITH CONTRAST TECHNIQUE: Multidetector CT imaging of the chest was performed using the standard protocol during bolus administration of intravenous contrast. Multiplanar CT image reconstructions and MIPs were obtained to evaluate the vascular anatomy. CONTRAST:  18mL OMNIPAQUE IOHEXOL 350 MG/ML SOLN COMPARISON:  02/26/2020, 04/15/2020 FINDINGS: Cardiovascular: This is a technically adequate evaluation of the pulmonary vasculature. There are large central pulmonary emboli seen bilaterally within the main pulmonary arteries, and extending into the segmental branches left greater than right. There is severe clot burden, with evidence of right heart strain with the RV/LV ratio measuring approximately 1.2. The heart is enlarged without pericardial effusion. Normal caliber of the thoracic aorta. Mediastinum/Nodes: No enlarged mediastinal, hilar, or axillary lymph nodes. Thyroid gland, trachea, and esophagus demonstrate no significant findings. Lungs/Pleura: Hypoventilatory changes are seen bilaterally within the lungs. Small peripheral areas of wedge-shaped consolidation are seen within the right upper and right middle lobes consistent with pulmonary infarcts. No effusion or pneumothorax. Central airways are patent. Upper Abdomen: No acute abnormality. Musculoskeletal: No acute or destructive bony lesions. Reconstructed images demonstrate no additional findings. Review of the MIP images confirms the above findings. IMPRESSION: 1. Acute bilateral central pulmonary emboli. Positive for acute PE with CT evidence of right heart strain (RV/LV Ratio = 1.2) consistent with at least submassive (intermediate risk) PE. The presence of right heart strain has been associated with  an increased risk of morbidity and mortality. 2. Cardiomegaly. 3. Bilateral hypoventilatory changes, with scattered peripheral areas of consolidation concerning for developing pulmonary infarcts. Critical Value/emergent results were called by telephone at the time of interpretation  on 04/16/2020 at 12:05 am to provider Dr. Ander Slade, who verbally acknowledged these results. Electronically Signed   By: Randa Ngo M.D.   On: 04/16/2020 00:11   CARDIAC CATHETERIZATION  Result Date: 04/15/2020 Findings: On milrinone 0.125 mcg/kg/min Ao =125/92 (109) LV = 122/25 RA =  15 RV = 63/20 PA = 67/32 (46) PCW = 22 (v=28) Fick cardiac output/index = 5.0/2.0 PVR = 6.0 WU SVR = 1513 Ao sat = 97% PA sat = 66%, 67% PAPi = 2.33 Assessment: 1. Normal coronary arteries 2. 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 Moderate mixed pulmonary venous HTN 4. Elevated filling pressures 5. Marginal CI on milrinone Plan/Discussion: Continue medical therapy. Glori Bickers, MD 4:12 PM   DG CHEST PORT 1 VIEW  Result Date: 04/15/2020 CLINICAL DATA:  Chest pain at rest. EXAM: PORTABLE CHEST 1 VIEW COMPARISON:  Radiograph 04/12/2020 FINDINGS: Right upper extremity PICC tip at the atrial caval junction. Lower lung volumes from prior exam leading to bronchovascular crowding. Cardiomegaly. No convincing pulmonary edema or pleural effusion. No pneumothorax. Elevation of right hemidiaphragm. IMPRESSION: Cardiomegaly. Lower lung volumes from prior exam leading to bronchovascular crowding. Electronically Signed   By: Keith Rake M.D.   On: 04/15/2020 18:35   MR CARDIAC MORPHOLOGY W WO CONTRAST  Result Date: 04/15/2020 CLINICAL DATA:  Clinical question of cardiomyopathy and left ventricular mass 50 year old Male Study assumes HCT of 64 EXAM: CARDIAC MRI TECHNIQUE: The patient was scanned on a 1.5 Tesla GE magnet. A dedicated cardiac coil was used. Functional imaging was done using Fiesta sequences. 2,3, and 4 chamber views were done to assess  for RWMA's. Modified Simpson's rule using a short axis stack was used to calculate an ejection fraction on a dedicated work Conservation officer, nature. The patient received 10 cc of Gadavist. After 10 minutes inversion recovery sequences were used to assess for infiltration and scar tissue. CONTRAST:  10 cc  of Gadavist FINDINGS: 1. Severe left ventricular dilation, with LVEDD 71 mm, and LVEDVi 130 mL/m2. Normal left ventricular thickness, with intraventricular septal thickness of 7 mm, posterior wall thickness of 5 mm. Severe left ventricular systolic dysfunction (LVEF =12%). There is severe global hypokinesis with apical dyskinesis. Left ventricular parametric mapping notable for diffuse increase in ECV signal in the apex (30-40%). Normal T2 signal. There is no late gadolinium enhancement in the left ventricular myocardium. There is a density 26 mm X 23 mm X 10.5 mm in the left ventricular apex that does not perfuse with contrast consistent with LV thrombus. 2. Severe right ventricular dilation with RVEDVI 104 mL/m2. Normal right ventricular thickness. Severe right ventricular systolic dysfunction (RVEF =21 %). Severe global ventricular hypokinesis with apical akinesis. 3.  Normal size of the left and moderate right atrial dilation: 4. Normal size of the aortic root, ascending aorta. Pulmonary artery is mildly dilated at 40 mm. 5. Tricuspid regurgitation and mitral regurgitation is visualized qualitatively. 6.  Normal pericardium.  No pericardial effusion. 7. Grossly, no extracardiac findings. Recommended dedicated study if concerned for non-cardiac pathology. IMPRESSION: 1.  Severe left ventricular dilation. 2. Severe left ventricular systolic dysfunction (LVEF =12%). There is severe global hypokinesis with apical dyskinesis. 3.  Diffuse increase in ECV signal in the apex (30-40%). 4.  There is likely an LV thrombus. 5.  Severe right ventricular dilation. 6. Severe right ventricular systolic dysfunction (RVEF  =21 %). Severe global ventricular hypokinesis with apical akinesis. 7.  Moderate right atrial dilation 8.  Pulmonary artery is mildly  dilated at 40 mm 9. Tricuspid regurgitation and mitral regurgitation is visualized qualitatively. Study consistent with dilated biventricular cardiomyopathy with LV thrombus Rudean Haskell MD Electronically Signed   By: Rudean Haskell MD   On: 04/15/2020 14:29     Medications:     Current Medications: . allopurinol  300 mg Oral Daily  . buPROPion  100 mg Oral BID  . Chlorhexidine Gluconate Cloth  6 each Topical Daily  . digoxin  0.125 mg Oral Daily  . feeding supplement  1 Container Oral TID BM  . fentaNYL      . lidocaine      . melatonin  5 mg Oral QHS  . midazolam      . morphine      . pantoprazole (PROTONIX) IV  40 mg Intravenous Q12H  . sodium chloride flush  10-40 mL Intracatheter Q12H  . sodium chloride flush  3 mL Intravenous Q12H  . spironolactone  12.5 mg Oral Daily  . sucralfate  1 g Oral TID WC & HS  . thiamine injection  100 mg Intravenous Daily    Infusions: . sodium chloride    . sodium chloride    . sodium chloride    . alteplase (tPA/ ACTIVASE) PE Lysis 12 mg/250 mL (BILATERAL)    . alteplase (tPA/ ACTIVASE) PE Lysis 12 mg/250 mL (BILATERAL)    . heparin 2,500 Units/hr (04/16/20 3559)  . milrinone 0.125 mcg/kg/min (04/15/20 2154)  . piperacillin-tazobactam (ZOSYN)  IV 3.375 g (04/16/20 0456)  . promethazine (PHENERGAN) injection         Assessment/Plan   1. 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 Also R femoral DVT - discussed case with IR and CCM.  - catheter-directed lytics not ideal with LV clot and hemoptysis - [place] thrombectomy increased risk with larger catheter due to RV strain - wil plan [place] clot extraction with ECMO team on standby - continue heparin  2. 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  - cMRI LVEF 12% RVEF 74% - now complicated by RV failure from PE - continue milrinone. hhold lasix  3. LV thrombus: -2.3 x 3.6 cm apical thrombus  CRITICAL CARE Performed by: Glori Bickers  Total critical care time: 120 minutes  Critical care time was exclusive of separately billable procedures and treating other patients.  Critical care was necessary to treat or prevent imminent or life-threatening deterioration.  Critical care was time spent personally by me (independent of midlevel providers or residents) on the following activities: development of treatment plan with patient and/or surrogate as well as nursing, discussions with consultants, evaluation of patient's response to treatment, examination of patient, obtaining history from patient or surrogate, ordering and performing treatments and interventions, ordering and review of laboratory studies, ordering and review of radiographic studies, pulse oximetry and re-evaluation of patient's condition.     Length of Stay: Gary, MD  04/16/2020, 10:15 AM  Advanced Heart Failure Team Pager [phone] (M-F; 7a - 4p)  Please contact [facility] for night-coverage after hours (4p -7a ) and weekends on [email or web address]    Glori Bickers, MD  10:15 AM

## 2020-04-17 DIAGNOSIS — R000 Tachycardia, unspecified: Secondary | ICD-10-CM | POA: Diagnosis not present

## 2020-04-17 DIAGNOSIS — R112 Nausea with vomiting, unspecified: Secondary | ICD-10-CM | POA: Diagnosis not present

## 2020-04-17 DIAGNOSIS — I5082 Biventricular heart failure: Secondary | ICD-10-CM | POA: Diagnosis not present

## 2020-04-17 DIAGNOSIS — I5021 Acute systolic (congestive) heart failure: Secondary | ICD-10-CM | POA: Diagnosis not present

## 2020-04-17 LAB — HEPATIC FUNCTION PANEL
ALT: 62 U/L — ABNORMAL HIGH (ref 0–44)
AST: 38 U/L (ref 15–41)
Albumin: 2.3 g/dL — ABNORMAL LOW (ref 3.5–5.0)
Alkaline Phosphatase: 55 U/L (ref 38–126)
Bilirubin, Direct: 2.3 mg/dL — ABNORMAL HIGH (ref 0.0–0.2)
Indirect Bilirubin: 1.8 mg/dL — ABNORMAL HIGH (ref 0.3–0.9)
Total Bilirubin: 4.1 mg/dL — ABNORMAL HIGH (ref 0.3–1.2)
Total Protein: 6.2 g/dL — ABNORMAL LOW (ref 6.5–8.1)

## 2020-04-17 LAB — FIBRINOGEN
Fibrinogen: 492 mg/dL — ABNORMAL HIGH (ref 210–475)
Fibrinogen: 606 mg/dL — ABNORMAL HIGH (ref 210–475)

## 2020-04-17 LAB — CBC
HCT: 44.4 % (ref 39.0–52.0)
HCT: 46.1 % (ref 39.0–52.0)
Hemoglobin: 14.2 g/dL (ref 13.0–17.0)
Hemoglobin: 14.5 g/dL (ref 13.0–17.0)
MCH: 25.3 pg — ABNORMAL LOW (ref 26.0–34.0)
MCH: 25.3 pg — ABNORMAL LOW (ref 26.0–34.0)
MCHC: 31.5 g/dL (ref 30.0–36.0)
MCHC: 32 g/dL (ref 30.0–36.0)
MCV: 79.1 fL — ABNORMAL LOW (ref 80.0–100.0)
MCV: 80.5 fL (ref 80.0–100.0)
Platelets: 163 10*3/uL (ref 150–400)
Platelets: 167 10*3/uL (ref 150–400)
RBC: 5.61 MIL/uL (ref 4.22–5.81)
RBC: 5.73 MIL/uL (ref 4.22–5.81)
RDW: 16.1 % — ABNORMAL HIGH (ref 11.5–15.5)
RDW: 16.4 % — ABNORMAL HIGH (ref 11.5–15.5)
WBC: 10.1 10*3/uL (ref 4.0–10.5)
WBC: 9.4 10*3/uL (ref 4.0–10.5)
nRBC: 0 % (ref 0.0–0.2)
nRBC: 0.2 % (ref 0.0–0.2)

## 2020-04-17 LAB — RENAL FUNCTION PANEL
Albumin: 2.4 g/dL — ABNORMAL LOW (ref 3.5–5.0)
Anion gap: 9 (ref 5–15)
BUN: 26 mg/dL — ABNORMAL HIGH (ref 6–20)
CO2: 35 mmol/L — ABNORMAL HIGH (ref 22–32)
Calcium: 8.6 mg/dL — ABNORMAL LOW (ref 8.9–10.3)
Chloride: 90 mmol/L — ABNORMAL LOW (ref 98–111)
Creatinine, Ser: 1.22 mg/dL (ref 0.61–1.24)
GFR, Estimated: >60 mL/min (ref >60)
Glucose, Bld: 177 mg/dL — ABNORMAL HIGH (ref 70–99)
Phosphorus: 3.4 mg/dL (ref 2.5–4.6)
Potassium: 3.3 mmol/L — ABNORMAL LOW (ref 3.5–5.1)
Sodium: 134 mmol/L — ABNORMAL LOW (ref 135–145)

## 2020-04-17 LAB — HEPARIN LEVEL (UNFRACTIONATED)
Heparin Unfractionated: 0.61 IU/mL (ref 0.30–0.70)
Heparin Unfractionated: 0.65 IU/mL (ref 0.30–0.70)

## 2020-04-17 LAB — COOXEMETRY PANEL
Carboxyhemoglobin: 0.9 % (ref 0.5–1.5)
Methemoglobin: 1.1 % (ref 0.0–1.5)
O2 Saturation: 69.1 %
Total hemoglobin: 14.6 g/dL (ref 12.0–16.0)

## 2020-04-17 LAB — LIPASE, BLOOD: Lipase: 170 U/L — ABNORMAL HIGH (ref 11–51)

## 2020-04-17 LAB — MRSA PCR SCREENING: MRSA by PCR: NEGATIVE

## 2020-04-17 MED ORDER — THIAMINE HCL 100 MG PO TABS
100.0000 mg | ORAL_TABLET | Freq: Every day | ORAL | Status: DC
  Administered 2020-04-17 – 2020-04-24 (×8): 100 mg via ORAL
  Filled 2020-04-17 (×8): qty 1

## 2020-04-17 MED ORDER — POTASSIUM CHLORIDE CRYS ER 20 MEQ PO TBCR
20.0000 meq | EXTENDED_RELEASE_TABLET | ORAL | Status: AC
  Filled 2020-04-17: qty 1

## 2020-04-17 MED ORDER — PANTOPRAZOLE SODIUM 40 MG PO TBEC
40.0000 mg | DELAYED_RELEASE_TABLET | Freq: Two times a day (BID) | ORAL | Status: DC
  Administered 2020-04-17 (×2): 40 mg via ORAL
  Filled 2020-04-17 (×2): qty 1

## 2020-04-17 MED ORDER — POTASSIUM CHLORIDE 10 MEQ/50ML IV SOLN
10.0000 meq | INTRAVENOUS | Status: AC
Start: 2020-04-17 — End: 2020-04-17
  Administered 2020-04-17 (×4): 10 meq via INTRAVENOUS
  Filled 2020-04-17: qty 50

## 2020-04-17 NOTE — Progress Notes (Signed)
AM K+ 3.3 with creat 1.22 and GFR > 60. CCM ELink electrolyte protocol initiated.

## 2020-04-17 NOTE — Progress Notes (Signed)
[place] for Heparin Indication: DVT/PE and apical thrombus  Allergies  Allergen Reactions  . Pseudoephedrine     REACTION: jittery    Patient Measurements: Height: 6\' 1"  (185.4 cm) Weight: 124.9 kg (275 lb 5.7 oz) IBW/kg (Calculated) : 79.9 Heparin Dosing Weight: 110 kg  Vital Signs: Temp: 97.7 F (36.5 C) (03/18 2300) Temp Source: Oral (03/18 2300) BP: 117/95 (03/19 0015) Pulse Rate: 110 (03/19 0015)  Labs: Recent Labs    04/14/20 1850 04/15/20 0659 04/15/20 0700 04/16/20 0406 04/16/20 0413 04/16/20 0445 04/16/20 0446 04/16/20 0515 04/16/20 1320 04/16/20 1549 04/16/20 1648 04/16/20 1727 04/16/20 2348  HGB  --  16.3   < >  --   --  16.7  --   --  18.4* 16.7  --   --  14.5  HCT  --  49.4   < >  --   --  51.7  --   --  54.0* 49.0  --   --  46.1  PLT  --  175  --   --   --  189  --   --   --   --   --   --  167  LABPROT  --   --   --   --   --   --   --  18.5*  --   --   --   --   --   INR  --   --   --   --   --   --   --  1.6*  --   --   --   --   --   HEPARINUNFRC  --   --    < >  --    < >  --   --  0.24*  --   --  0.69  --  0.61  CREATININE 1.28* 1.40*  --   --   --   --  1.40*  --   --   --   --   --   --   TROPONINIHS  --   --   --  60*  --   --   --   --   --   --   --  98*  --    < > = values in this interval not displayed.    Estimated Creatinine Clearance: 86.4 mL/min (A) (by C-G formula based on SCr of 1.4 mg/dL (H)).  Assessment: 50 y.o. male with RLE DVT and apical thrombus continuing on IV heparin. 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  CTA showed submassive bilateral PE with RHS, now s/p IR for thrombectomy 3/18. Heparin drip was not turned off at all during procedure. Patient did NOT receive thrombolytics.  Heparin level therapeutic (0.61) on gtt at 2450 units/hr. Pink tinged sputum still  noted but stable.  Goal of Therapy:  Heparin level 0.3-0.7 units/ml Monitor platelets by anticoagulation protocol: Yes   Plan:  Continue heparin at 2450 units/hr  Monitor daily CBC, s/sx bleeding  Sherlon Handing, PharmD, BCPS Please see amion for complete clinical pharmacist phone list 04/17/2020 12:44 AM

## 2020-04-17 NOTE — Plan of Care (Signed)

## 2020-04-17 NOTE — Progress Notes (Signed)
Advanced Heart Failure Team Rounding Note   Primary Physician: Kathlene November Primary Cardiologist:  Fransico Him  Reason for Consultation: Biventricular CHF  HPI:    Events - 3/17 cath with normal cor. RV strain on RHC.  - Post cath developed CP and hemoptysis. CT with submassive bilateral PE and RUL and RML infarcts.  - 3/18 underwent Penumbra clot extraction   He remains on milrinone 0.125, iNO 40, amio and IV heparin  Breathing better. Mild hemoptysis overnight - now slowing down. HR down to 110-115 range. CO-ox 69%. CVP 10 Hgb stable at 14.   Objective:    Vital Signs:   Temp:  [96.6 F (35.9 C)-98.1 F (36.7 C)] 96.6 F (35.9 C) (03/19 0806) Pulse Rate:  [107-126] 111 (03/19 0845) Resp:  [20-37] 25 (03/19 0845) BP: (95-127)/(58-95) 95/62 (03/19 0806) SpO2:  [85 %-100 %] 98 % (03/19 0854) Weight:  [121.1 kg] 121.1 kg (03/19 0421) Last BM Date: 04/13/20  Weight change: Filed Weights   04/15/20 0623 04/16/20 0534 04/17/20 0421  Weight: 127.2 kg 124.9 kg 121.1 kg    Intake/Output:   Intake/Output Summary (Last 24 hours) at 04/17/2020 0859 Last data filed at 04/17/2020 0610 Gross per 24 hour  Intake 2021.77 ml  Output 2175 ml  Net -153.23 ml      Physical Exam    General:  Sitting up in bed. No resp difficulty HEENT: normal Neck: supple. JVP 10. Carotids 2+ bilat; no bruits. No lymphadenopathy or thryomegaly appreciated. Cor: PMI nondisplaced. Regular rate & rhythm. 2/6 TR  Lungs: clear Abdomen: soft, nontender, nondistended. No hepatosplenomegaly. No bruits or masses. Good bowel sounds. Extremities: no cyanosis, clubbing, rash, edema Neuro: alert & orientedx3, cranial nerves grossly intact. moves all 4 extremities w/o difficulty. Affect pleasant  Telemetry   Sinus tach 110-115 Personally reviewed   Labs   Basic Metabolic Panel: Recent Labs  Lab 04/11/20 1140 04/12/20 0027 04/14/20 0712 04/14/20 1850 04/15/20 0659 04/15/20 1527  04/15/20 1530 04/16/20 0446 04/16/20 1320 04/16/20 1549 04/17/20 0505  NA 135   < > 139 138 139   < > 142 136 137 138 134*  K 4.5   < > 4.3 3.5 3.8   < > 3.2* 3.9 3.7 3.6 3.3*  CL 102   < > 101 94* 90*  --   --  89*  --   --  90*  CO2 21*   < > 27 33* 34*  --   --  35*  --   --  35*  GLUCOSE 125*   < > 114* 140* 116*  --   --  134*  --   --  177*  BUN 24*   < > 24* 24* 24*  --   --  27*  --   --  26*  CREATININE 1.12   < > 1.31* 1.28* 1.40*  --   --  1.40*  --   --  1.22  CALCIUM 8.6*   < > 9.1 8.8* 8.8*  --   --  9.0  --   --  8.6*  MG 2.0  --   --   --   --   --   --   --   --   --   --   PHOS  --   --  4.9*  --  5.2*  --   --  4.5  --   --  3.4   < > = values in this interval  not displayed.    Liver Function Tests: Recent Labs  Lab 04/13/20 0041 04/14/20 0712 04/14/20 0713 04/15/20 0659 04/16/20 0445 04/16/20 0446 04/17/20 0505  AST 104*  --  62* 56* 52*  --  38  ALT 169*  --  132* 111* 92*  --  62*  ALKPHOS 59  --  61 63 65  --  55  BILITOT 4.5*  --  4.6* 5.0* 5.0*  --  4.1*  PROT 6.1*  --  6.4* 6.8 7.0  --  6.2*  ALBUMIN 2.5*   < > 2.6* 2.7*  2.6* 2.6* 2.6* 2.3*  2.4*   < > = values in this interval not displayed.   Recent Labs  Lab 04/12/20 0027 04/13/20 0041 04/14/20 0713 04/15/20 0659 04/16/20 0445 04/17/20 0505  LIPASE 155* 201* 142* 165* 152* 170*  AMYLASE 159*  --   --   --   --   --    No results for input(s): AMMONIA in the last 168 hours.  CBC: Recent Labs  Lab 04/11/20 1140 04/11/20 1841 04/14/20 0713 04/15/20 0659 04/15/20 1527 04/16/20 0445 04/16/20 1320 04/16/20 1549 04/16/20 2348 04/17/20 0505  WBC 6.9   < > 7.3 7.5  --  8.1  --   --  10.1 9.4  NEUTROABS 4.9  --   --   --   --   --   --   --   --   --   HGB 16.2   < > 15.6 16.3   < > 16.7 18.4* 16.7 14.5 14.2  HCT 50.2   < > 48.9 49.4   < > 51.7 54.0* 49.0 46.1 44.4  MCV 79.4*   < > 79.1* 77.4*  --  78.3*  --   --  80.5 79.1*  PLT 146*   < > 149* 175  --  189  --   --  167 163    < > = values in this interval not displayed.    Cardiac Enzymes: No results for input(s): CKTOTAL, CKMB, CKMBINDEX, TROPONINI in the last 168 hours.  BNP: BNP (last 3 results) Recent Labs    04/12/20 1216 04/16/20 0406  BNP 1,153.9* 885.4*    ProBNP (last 3 results) No results for input(s): PROBNP in the last 8760 hours.   CBG: No results for input(s): GLUCAP in the last 168 hours.  Coagulation Studies: Recent Labs    04/16/20 0515  LABPROT 18.5*  INR 1.6*     Imaging   IR Angiogram Pulmonary Bilateral Selective  Result Date: 04/16/2020 INDICATION: 50 year old male with history of intermediate high risk (PESI class 4) acute pulmonary embolism in the setting of chronic heart failure and right lower extremity deep vein thrombosis. EXAM: 1. Ultrasound-guided left common femoral artery access. 2. Ultrasound-guided left common femoral vein access. 3. Ultrasound-guided right common femoral vein access. 4. Pulmonary angiogram. 5. Pulmonary manometry. 6. Selective catheterization of the bilateral pulmonary arteries. 7. Mechanical thrombectomy of the bilateral pulmonary arteries. COMPARISON:  CT a chest from 04/15/2020 MEDICATIONS: None. ANESTHESIA/SEDATION: The procedure was performed under monitored anesthesia care by the Department of Anesthesia. Please refer to anesthesia record for medication administration details. FLUOROSCOPY TIME:  Fluoroscopy Time: 40 minutes 24 seconds (757 mGy). COMPLICATIONS: None immediate. TECHNIQUE: Informed written consent was obtained from the patient after a thorough discussion of the procedural risks, benefits and alternatives. All questions were addressed. Maximal Sterile Barrier Technique was utilized including caps, mask, sterile gowns, sterile gloves, sterile drape, hand hygiene  and skin antiseptic. A timeout was performed prior to the initiation of the procedure. The bilateral groins were prepped and draped in standard fashion. Given propensity of  clinical decompensation, request was made for vascular access in the setting of possible ECMO cannulation. Therefore, the left common femoral artery and common femoral vein were evaluated with ultrasound. Subdermal Local anesthesia was administered with 1% lidocaine at the planned needle entry sites. Small skin nicks were made with an 11 blade. Under direct ultrasound visualization, the left common femoral artery was punctured with a 21 gauge micropuncture set. A 4/5 French slender sheath was then placed, and this sheath was used for arterial monitoring during the procedure. Next, the left common femoral vein was punctured under direct ultrasound visualization. A 4/5 French slender sheath was then placed. Attention was then turned to the right groin. Ultrasound evaluation demonstrated a patent right common femoral vein with compressibility. The procedure was planned. Subdermal Local anesthesia was administered at the planned needle entry site. A small skin nick was made. Under direct ultrasound visualization, a 21 gauge micropuncture needle was used to puncture the right common femoral vein. The micropuncture set was exchanged over a J wire for a 5 [place], 11 cm vascular sheath. The wire and a pigtail catheter were directed to the perihepatic inferior vena cava. The wire was removed and exchanged for a tip deflecting wire. Under fluoroscopic guidance, the tip deflecting wire and pigtail catheter were guided through the right atrium and right ventricle into the main pulmonary artery without difficulty. Pulmonary artery pressures were then measured (69/33, mean 45 mmHg). Pulmonary angiogram was performed which demonstrated large filling defects in the bilateral main pulmonary arteries with limited peripheral perfusion. The left pulmonary artery was then selected with an Amplatz wire. The pigtail catheter and 5 French sheath were removed. Serial dilation was performed and a 12 French, 35 cm dry seal sheath was placed.  Through the sheath, the Penumbra CAT 12 thrombectomy catheter was directed into the proximal left pulmonary artery. Mechanical suction thrombectomy was performed using the separator. Multiple passes were made targeting the upper and lower lobe segmental branches. The thrombectomy catheter was removed and flushed. Repeat left-sided pulmonary angiogram was performed with the pigtail catheter which demonstrated significantly improved clot burden and perfusion of the left lung. Attention was then turned toward the right pulmonary artery. A stiff Glidewire was used to select the right inferior lobar pulmonary artery, over which the CAT 12 thrombectomy catheter was advanced. Multiple passes performing section thrombectomy were performed using the separator. The thrombectomy catheter was removed in flush. Repeat right-sided pulmonary angiogram was performed with the pigtail catheters demonstrated significant improved clot burden of perfusion to the right lung. The pigtail catheter was retracted into the main pulmonary artery and repeat pressures were obtained (50/33, mean 38 mmHg). Completion pulmonary angiogram demonstrated some residual bilateral lobar and segmental clot burden, however significantly improved from comparison with overall significantly improved bilateral pulmonary arterial perfusion. At this point, the patient noted to breathing easier. The catheters and sheaths were removed and manual compression was applied bilaterally without complication. The patient was transferred to the intensive care unit in stable condition. FINDINGS: Bilateral pulmonary emboli, mostly occlusive with associated pulmonary hypertension. IMPRESSION: 1. Bilateral main pulmonary emboli with associated pulmonary arterial hypertension. 2. Technically successful mechanical thrombectomy of the bilateral pulmonary arteries with reduction in pulmonary arterial hypertension. 3. Technically successful temporary left common femoral artery and  common femoral vein vascular access for potential ECMO cannulation. These accesses were  removed upon completion of the case as ECMO was not required. Ruthann Cancer, MD [facility] Radiology Electronically Signed   By: Ruthann Cancer MD   On: 04/16/2020 16:44   IR Angiogram Selective Each Additional Vessel  Result Date: 04/16/2020 INDICATION: 50 year old male with history of intermediate high risk (PESI class 4) acute pulmonary embolism in the setting of chronic heart failure and right lower extremity deep vein thrombosis. EXAM: 1. Ultrasound-guided left common femoral artery access. 2. Ultrasound-guided left common femoral vein access. 3. Ultrasound-guided right common femoral vein access. 4. Pulmonary angiogram. 5. Pulmonary manometry. 6. Selective catheterization of the bilateral pulmonary arteries. 7. Mechanical thrombectomy of the bilateral pulmonary arteries. COMPARISON:  CT a chest from 04/15/2020 MEDICATIONS: None. ANESTHESIA/SEDATION: The procedure was performed under monitored anesthesia care by the Department of Anesthesia. Please refer to anesthesia record for medication administration details. FLUOROSCOPY TIME:  Fluoroscopy Time: 40 minutes 24 seconds (757 mGy). COMPLICATIONS: None immediate. TECHNIQUE: Informed written consent was obtained from the patient after a thorough discussion of the procedural risks, benefits and alternatives. All questions were addressed. Maximal Sterile Barrier Technique was utilized including caps, mask, sterile gowns, sterile gloves, sterile drape, hand hygiene and skin antiseptic. A timeout was performed prior to the initiation of the procedure. The bilateral groins were prepped and draped in standard fashion. Given propensity of clinical decompensation, request was made for vascular access in the setting of possible ECMO cannulation. Therefore, the left common femoral artery and common femoral vein were evaluated with  ultrasound. Subdermal Local anesthesia was administered with 1% lidocaine at the planned needle entry sites. Small skin nicks were made with an 11 blade. Under direct ultrasound visualization, the left common femoral artery was punctured with a 21 gauge micropuncture set. A 4/5 French slender sheath was then placed, and this sheath was used for arterial monitoring during the procedure. Next, the left common femoral vein was punctured under direct ultrasound visualization. A 4/5 French slender sheath was then placed. Attention was then turned to the right groin. Ultrasound evaluation demonstrated a patent right common femoral vein with compressibility. The procedure was planned. Subdermal Local anesthesia was administered at the planned needle entry site. A small skin nick was made. Under direct ultrasound visualization, a 21 gauge micropuncture needle was used to puncture the right common femoral vein. The micropuncture set was exchanged over a J wire for a 5 [place], 11 cm vascular sheath. The wire and a pigtail catheter were directed to the perihepatic inferior vena cava. The wire was removed and exchanged for a tip deflecting wire. Under fluoroscopic guidance, the tip deflecting wire and pigtail catheter were guided through the right atrium and right ventricle into the main pulmonary artery without difficulty. Pulmonary artery pressures were then measured (69/33, mean 45 mmHg). Pulmonary angiogram was performed which demonstrated large filling defects in the bilateral main pulmonary arteries with limited peripheral perfusion. The left pulmonary artery was then selected with an Amplatz wire. The pigtail catheter and 5 French sheath were removed. Serial dilation was performed and a 12 French, 35 cm dry seal sheath was placed. Through the sheath, the Penumbra CAT 12 thrombectomy catheter was directed into the proximal left pulmonary artery. Mechanical suction thrombectomy was performed using the separator. Multiple  passes were made targeting the upper and lower lobe segmental branches. The thrombectomy catheter was removed and flushed. Repeat left-sided pulmonary angiogram was performed with the pigtail catheter which demonstrated significantly improved clot burden and perfusion of the left lung.  Attention was then turned toward the right pulmonary artery. A stiff Glidewire was used to select the right inferior lobar pulmonary artery, over which the CAT 12 thrombectomy catheter was advanced. Multiple passes performing section thrombectomy were performed using the separator. The thrombectomy catheter was removed in flush. Repeat right-sided pulmonary angiogram was performed with the pigtail catheters demonstrated significant improved clot burden of perfusion to the right lung. The pigtail catheter was retracted into the main pulmonary artery and repeat pressures were obtained (50/33, mean 38 mmHg). Completion pulmonary angiogram demonstrated some residual bilateral lobar and segmental clot burden, however significantly improved from comparison with overall significantly improved bilateral pulmonary arterial perfusion. At this point, the patient noted to breathing easier. The catheters and sheaths were removed and manual compression was applied bilaterally without complication. The patient was transferred to the intensive care unit in stable condition. FINDINGS: Bilateral pulmonary emboli, mostly occlusive with associated pulmonary hypertension. IMPRESSION: 1. Bilateral main pulmonary emboli with associated pulmonary arterial hypertension. 2. Technically successful mechanical thrombectomy of the bilateral pulmonary arteries with reduction in pulmonary arterial hypertension. 3. Technically successful temporary left common femoral artery and common femoral vein vascular access for potential ECMO cannulation. These accesses were removed upon completion of the case as ECMO was not required. Ruthann Cancer, MD [facility] Radiology Electronically Signed   By: Ruthann Cancer MD   On: 04/16/2020 16:44   IR Angiogram Selective Each Additional Vessel  Result Date: 04/16/2020 INDICATION: 50 year old male with history of intermediate high risk (PESI class 4) acute pulmonary embolism in the setting of chronic heart failure and right lower extremity deep vein thrombosis. EXAM: 1. Ultrasound-guided left common femoral artery access. 2. Ultrasound-guided left common femoral vein access. 3. Ultrasound-guided right common femoral vein access. 4. Pulmonary angiogram. 5. Pulmonary manometry. 6. Selective catheterization of the bilateral pulmonary arteries. 7. Mechanical thrombectomy of the bilateral pulmonary arteries. COMPARISON:  CT a chest from 04/15/2020 MEDICATIONS: None. ANESTHESIA/SEDATION: The procedure was performed under monitored anesthesia care by the Department of Anesthesia. Please refer to anesthesia record for medication administration details. FLUOROSCOPY TIME:  Fluoroscopy Time: 40 minutes 24 seconds (757 mGy). COMPLICATIONS: None immediate. TECHNIQUE: Informed written consent was obtained from the patient after a thorough discussion of the procedural risks, benefits and alternatives. All questions were addressed. Maximal Sterile Barrier Technique was utilized including caps, mask, sterile gowns, sterile gloves, sterile drape, hand hygiene and skin antiseptic. A timeout was performed prior to the initiation of the procedure. The bilateral groins were prepped and draped in standard fashion. Given propensity of clinical decompensation, request was made for vascular access in the setting of possible ECMO cannulation. Therefore, the left common femoral artery and common femoral vein were evaluated with ultrasound. Subdermal Local anesthesia was administered with 1% lidocaine at the planned needle entry sites. Small skin nicks were made with an 11 blade. Under direct ultrasound  visualization, the left common femoral artery was punctured with a 21 gauge micropuncture set. A 4/5 French slender sheath was then placed, and this sheath was used for arterial monitoring during the procedure. Next, the left common femoral vein was punctured under direct ultrasound visualization. A 4/5 French slender sheath was then placed. Attention was then turned to the right groin. Ultrasound evaluation demonstrated a patent right common femoral vein with compressibility. The procedure was planned. Subdermal Local anesthesia was administered at the planned needle entry site. A small skin nick was made. Under direct ultrasound visualization, a 21 gauge micropuncture needle was  used to puncture the right common femoral vein. The micropuncture set was exchanged over a J wire for a 5 [place], 11 cm vascular sheath. The wire and a pigtail catheter were directed to the perihepatic inferior vena cava. The wire was removed and exchanged for a tip deflecting wire. Under fluoroscopic guidance, the tip deflecting wire and pigtail catheter were guided through the right atrium and right ventricle into the main pulmonary artery without difficulty. Pulmonary artery pressures were then measured (69/33, mean 45 mmHg). Pulmonary angiogram was performed which demonstrated large filling defects in the bilateral main pulmonary arteries with limited peripheral perfusion. The left pulmonary artery was then selected with an Amplatz wire. The pigtail catheter and 5 French sheath were removed. Serial dilation was performed and a 12 French, 35 cm dry seal sheath was placed. Through the sheath, the Penumbra CAT 12 thrombectomy catheter was directed into the proximal left pulmonary artery. Mechanical suction thrombectomy was performed using the separator. Multiple passes were made targeting the upper and lower lobe segmental branches. The thrombectomy catheter was removed and flushed. Repeat left-sided pulmonary angiogram was performed with  the pigtail catheter which demonstrated significantly improved clot burden and perfusion of the left lung. Attention was then turned toward the right pulmonary artery. A stiff Glidewire was used to select the right inferior lobar pulmonary artery, over which the CAT 12 thrombectomy catheter was advanced. Multiple passes performing section thrombectomy were performed using the separator. The thrombectomy catheter was removed in flush. Repeat right-sided pulmonary angiogram was performed with the pigtail catheters demonstrated significant improved clot burden of perfusion to the right lung. The pigtail catheter was retracted into the main pulmonary artery and repeat pressures were obtained (50/33, mean 38 mmHg). Completion pulmonary angiogram demonstrated some residual bilateral lobar and segmental clot burden, however significantly improved from comparison with overall significantly improved bilateral pulmonary arterial perfusion. At this point, the patient noted to breathing easier. The catheters and sheaths were removed and manual compression was applied bilaterally without complication. The patient was transferred to the intensive care unit in stable condition. FINDINGS: Bilateral pulmonary emboli, mostly occlusive with associated pulmonary hypertension. IMPRESSION: 1. Bilateral main pulmonary emboli with associated pulmonary arterial hypertension. 2. Technically successful mechanical thrombectomy of the bilateral pulmonary arteries with reduction in pulmonary arterial hypertension. 3. Technically successful temporary left common femoral artery and common femoral vein vascular access for potential ECMO cannulation. These accesses were removed upon completion of the case as ECMO was not required. Ruthann Cancer, MD [facility] Radiology Electronically Signed   By: Ruthann Cancer MD   On: 04/16/2020 16:44   IR THROMBECT PRIM MECH INIT (INCLU) MOD SED  Result Date:  04/16/2020 INDICATION: 50 year old male with history of intermediate high risk (PESI class 4) acute pulmonary embolism in the setting of chronic heart failure and right lower extremity deep vein thrombosis. EXAM: 1. Ultrasound-guided left common femoral artery access. 2. Ultrasound-guided left common femoral vein access. 3. Ultrasound-guided right common femoral vein access. 4. Pulmonary angiogram. 5. Pulmonary manometry. 6. Selective catheterization of the bilateral pulmonary arteries. 7. Mechanical thrombectomy of the bilateral pulmonary arteries. COMPARISON:  CT a chest from 04/15/2020 MEDICATIONS: None. ANESTHESIA/SEDATION: The procedure was performed under monitored anesthesia care by the Department of Anesthesia. Please refer to anesthesia record for medication administration details. FLUOROSCOPY TIME:  Fluoroscopy Time: 40 minutes 24 seconds (757 mGy). COMPLICATIONS: None immediate. TECHNIQUE: Informed written consent was obtained from the patient after a thorough discussion of the procedural risks, benefits  and alternatives. All questions were addressed. Maximal Sterile Barrier Technique was utilized including caps, mask, sterile gowns, sterile gloves, sterile drape, hand hygiene and skin antiseptic. A timeout was performed prior to the initiation of the procedure. The bilateral groins were prepped and draped in standard fashion. Given propensity of clinical decompensation, request was made for vascular access in the setting of possible ECMO cannulation. Therefore, the left common femoral artery and common femoral vein were evaluated with ultrasound. Subdermal Local anesthesia was administered with 1% lidocaine at the planned needle entry sites. Small skin nicks were made with an 11 blade. Under direct ultrasound visualization, the left common femoral artery was punctured with a 21 gauge micropuncture set. A 4/5 French slender sheath was then placed, and this sheath was used for arterial monitoring during  the procedure. Next, the left common femoral vein was punctured under direct ultrasound visualization. A 4/5 French slender sheath was then placed. Attention was then turned to the right groin. Ultrasound evaluation demonstrated a patent right common femoral vein with compressibility. The procedure was planned. Subdermal Local anesthesia was administered at the planned needle entry site. A small skin nick was made. Under direct ultrasound visualization, a 21 gauge micropuncture needle was used to puncture the right common femoral vein. The micropuncture set was exchanged over a J wire for a 5 [place], 11 cm vascular sheath. The wire and a pigtail catheter were directed to the perihepatic inferior vena cava. The wire was removed and exchanged for a tip deflecting wire. Under fluoroscopic guidance, the tip deflecting wire and pigtail catheter were guided through the right atrium and right ventricle into the main pulmonary artery without difficulty. Pulmonary artery pressures were then measured (69/33, mean 45 mmHg). Pulmonary angiogram was performed which demonstrated large filling defects in the bilateral main pulmonary arteries with limited peripheral perfusion. The left pulmonary artery was then selected with an Amplatz wire. The pigtail catheter and 5 French sheath were removed. Serial dilation was performed and a 12 French, 35 cm dry seal sheath was placed. Through the sheath, the Penumbra CAT 12 thrombectomy catheter was directed into the proximal left pulmonary artery. Mechanical suction thrombectomy was performed using the separator. Multiple passes were made targeting the upper and lower lobe segmental branches. The thrombectomy catheter was removed and flushed. Repeat left-sided pulmonary angiogram was performed with the pigtail catheter which demonstrated significantly improved clot burden and perfusion of the left lung. Attention was then turned toward the right pulmonary artery. A stiff Glidewire was used  to select the right inferior lobar pulmonary artery, over which the CAT 12 thrombectomy catheter was advanced. Multiple passes performing section thrombectomy were performed using the separator. The thrombectomy catheter was removed in flush. Repeat right-sided pulmonary angiogram was performed with the pigtail catheters demonstrated significant improved clot burden of perfusion to the right lung. The pigtail catheter was retracted into the main pulmonary artery and repeat pressures were obtained (50/33, mean 38 mmHg). Completion pulmonary angiogram demonstrated some residual bilateral lobar and segmental clot burden, however significantly improved from comparison with overall significantly improved bilateral pulmonary arterial perfusion. At this point, the patient noted to breathing easier. The catheters and sheaths were removed and manual compression was applied bilaterally without complication. The patient was transferred to the intensive care unit in stable condition. FINDINGS: Bilateral pulmonary emboli, mostly occlusive with associated pulmonary hypertension. IMPRESSION: 1. Bilateral main pulmonary emboli with associated pulmonary arterial hypertension. 2. Technically successful mechanical thrombectomy of the bilateral pulmonary arteries with reduction in pulmonary arterial  hypertension. 3. Technically successful temporary left common femoral artery and common femoral vein vascular access for potential ECMO cannulation. These accesses were removed upon completion of the case as ECMO was not required. Ruthann Cancer, MD [facility] Radiology Electronically Signed   By: Ruthann Cancer MD   On: 04/16/2020 16:44   IR US Guide Vasc Access Left  Result Date: 04/16/2020 INDICATION: 50 year old male with history of intermediate high risk (PESI class 4) acute pulmonary embolism in the setting of chronic heart failure and right lower extremity deep vein thrombosis. EXAM:  1. Ultrasound-guided left common femoral artery access. 2. Ultrasound-guided left common femoral vein access. 3. Ultrasound-guided right common femoral vein access. 4. Pulmonary angiogram. 5. Pulmonary manometry. 6. Selective catheterization of the bilateral pulmonary arteries. 7. Mechanical thrombectomy of the bilateral pulmonary arteries. COMPARISON:  CT a chest from 04/15/2020 MEDICATIONS: None. ANESTHESIA/SEDATION: The procedure was performed under monitored anesthesia care by the Department of Anesthesia. Please refer to anesthesia record for medication administration details. FLUOROSCOPY TIME:  Fluoroscopy Time: 40 minutes 24 seconds (757 mGy). COMPLICATIONS: None immediate. TECHNIQUE: Informed written consent was obtained from the patient after a thorough discussion of the procedural risks, benefits and alternatives. All questions were addressed. Maximal Sterile Barrier Technique was utilized including caps, mask, sterile gowns, sterile gloves, sterile drape, hand hygiene and skin antiseptic. A timeout was performed prior to the initiation of the procedure. The bilateral groins were prepped and draped in standard fashion. Given propensity of clinical decompensation, request was made for vascular access in the setting of possible ECMO cannulation. Therefore, the left common femoral artery and common femoral vein were evaluated with ultrasound. Subdermal Local anesthesia was administered with 1% lidocaine at the planned needle entry sites. Small skin nicks were made with an 11 blade. Under direct ultrasound visualization, the left common femoral artery was punctured with a 21 gauge micropuncture set. A 4/5 French slender sheath was then placed, and this sheath was used for arterial monitoring during the procedure. Next, the left common femoral vein was punctured under direct ultrasound visualization. A 4/5 French slender sheath was then placed. Attention was then turned to the right groin. Ultrasound evaluation  demonstrated a patent right common femoral vein with compressibility. The procedure was planned. Subdermal Local anesthesia was administered at the planned needle entry site. A small skin nick was made. Under direct ultrasound visualization, a 21 gauge micropuncture needle was used to puncture the right common femoral vein. The micropuncture set was exchanged over a J wire for a 5 [place], 11 cm vascular sheath. The wire and a pigtail catheter were directed to the perihepatic inferior vena cava. The wire was removed and exchanged for a tip deflecting wire. Under fluoroscopic guidance, the tip deflecting wire and pigtail catheter were guided through the right atrium and right ventricle into the main pulmonary artery without difficulty. Pulmonary artery pressures were then measured (69/33, mean 45 mmHg). Pulmonary angiogram was performed which demonstrated large filling defects in the bilateral main pulmonary arteries with limited peripheral perfusion. The left pulmonary artery was then selected with an Amplatz wire. The pigtail catheter and 5 French sheath were removed. Serial dilation was performed and a 12 French, 35 cm dry seal sheath was placed. Through the sheath, the Penumbra CAT 12 thrombectomy catheter was directed into the proximal left pulmonary artery. Mechanical suction thrombectomy was performed using the separator. Multiple passes were made targeting the upper and lower lobe segmental branches. The thrombectomy catheter was removed and flushed.  Repeat left-sided pulmonary angiogram was performed with the pigtail catheter which demonstrated significantly improved clot burden and perfusion of the left lung. Attention was then turned toward the right pulmonary artery. A stiff Glidewire was used to select the right inferior lobar pulmonary artery, over which the CAT 12 thrombectomy catheter was advanced. Multiple passes performing section thrombectomy were performed using the separator. The thrombectomy  catheter was removed in flush. Repeat right-sided pulmonary angiogram was performed with the pigtail catheters demonstrated significant improved clot burden of perfusion to the right lung. The pigtail catheter was retracted into the main pulmonary artery and repeat pressures were obtained (50/33, mean 38 mmHg). Completion pulmonary angiogram demonstrated some residual bilateral lobar and segmental clot burden, however significantly improved from comparison with overall significantly improved bilateral pulmonary arterial perfusion. At this point, the patient noted to breathing easier. The catheters and sheaths were removed and manual compression was applied bilaterally without complication. The patient was transferred to the intensive care unit in stable condition. FINDINGS: Bilateral pulmonary emboli, mostly occlusive with associated pulmonary hypertension. IMPRESSION: 1. Bilateral main pulmonary emboli with associated pulmonary arterial hypertension. 2. Technically successful mechanical thrombectomy of the bilateral pulmonary arteries with reduction in pulmonary arterial hypertension. 3. Technically successful temporary left common femoral artery and common femoral vein vascular access for potential ECMO cannulation. These accesses were removed upon completion of the case as ECMO was not required. Ruthann Cancer, MD [facility] Radiology Electronically Signed   By: Ruthann Cancer MD   On: 04/16/2020 16:44   IR US Guide Vasc Access Left  Result Date: 04/16/2020 INDICATION: 50 year old male with history of intermediate high risk (PESI class 4) acute pulmonary embolism in the setting of chronic heart failure and right lower extremity deep vein thrombosis. EXAM: 1. Ultrasound-guided left common femoral artery access. 2. Ultrasound-guided left common femoral vein access. 3. Ultrasound-guided right common femoral vein access. 4. Pulmonary angiogram. 5. Pulmonary  manometry. 6. Selective catheterization of the bilateral pulmonary arteries. 7. Mechanical thrombectomy of the bilateral pulmonary arteries. COMPARISON:  CT a chest from 04/15/2020 MEDICATIONS: None. ANESTHESIA/SEDATION: The procedure was performed under monitored anesthesia care by the Department of Anesthesia. Please refer to anesthesia record for medication administration details. FLUOROSCOPY TIME:  Fluoroscopy Time: 40 minutes 24 seconds (757 mGy). COMPLICATIONS: None immediate. TECHNIQUE: Informed written consent was obtained from the patient after a thorough discussion of the procedural risks, benefits and alternatives. All questions were addressed. Maximal Sterile Barrier Technique was utilized including caps, mask, sterile gowns, sterile gloves, sterile drape, hand hygiene and skin antiseptic. A timeout was performed prior to the initiation of the procedure. The bilateral groins were prepped and draped in standard fashion. Given propensity of clinical decompensation, request was made for vascular access in the setting of possible ECMO cannulation. Therefore, the left common femoral artery and common femoral vein were evaluated with ultrasound. Subdermal Local anesthesia was administered with 1% lidocaine at the planned needle entry sites. Small skin nicks were made with an 11 blade. Under direct ultrasound visualization, the left common femoral artery was punctured with a 21 gauge micropuncture set. A 4/5 French slender sheath was then placed, and this sheath was used for arterial monitoring during the procedure. Next, the left common femoral vein was punctured under direct ultrasound visualization. A 4/5 French slender sheath was then placed. Attention was then turned to the right groin. Ultrasound evaluation demonstrated a patent right common femoral vein with compressibility. The procedure was planned. Subdermal Local anesthesia was administered  at the planned needle entry site. A small skin nick was  made. Under direct ultrasound visualization, a 21 gauge micropuncture needle was used to puncture the right common femoral vein. The micropuncture set was exchanged over a J wire for a 5 [place], 11 cm vascular sheath. The wire and a pigtail catheter were directed to the perihepatic inferior vena cava. The wire was removed and exchanged for a tip deflecting wire. Under fluoroscopic guidance, the tip deflecting wire and pigtail catheter were guided through the right atrium and right ventricle into the main pulmonary artery without difficulty. Pulmonary artery pressures were then measured (69/33, mean 45 mmHg). Pulmonary angiogram was performed which demonstrated large filling defects in the bilateral main pulmonary arteries with limited peripheral perfusion. The left pulmonary artery was then selected with an Amplatz wire. The pigtail catheter and 5 French sheath were removed. Serial dilation was performed and a 12 French, 35 cm dry seal sheath was placed. Through the sheath, the Penumbra CAT 12 thrombectomy catheter was directed into the proximal left pulmonary artery. Mechanical suction thrombectomy was performed using the separator. Multiple passes were made targeting the upper and lower lobe segmental branches. The thrombectomy catheter was removed and flushed. Repeat left-sided pulmonary angiogram was performed with the pigtail catheter which demonstrated significantly improved clot burden and perfusion of the left lung. Attention was then turned toward the right pulmonary artery. A stiff Glidewire was used to select the right inferior lobar pulmonary artery, over which the CAT 12 thrombectomy catheter was advanced. Multiple passes performing section thrombectomy were performed using the separator. The thrombectomy catheter was removed in flush. Repeat right-sided pulmonary angiogram was performed with the pigtail catheters demonstrated significant improved clot burden of perfusion to the right lung. The pigtail  catheter was retracted into the main pulmonary artery and repeat pressures were obtained (50/33, mean 38 mmHg). Completion pulmonary angiogram demonstrated some residual bilateral lobar and segmental clot burden, however significantly improved from comparison with overall significantly improved bilateral pulmonary arterial perfusion. At this point, the patient noted to breathing easier. The catheters and sheaths were removed and manual compression was applied bilaterally without complication. The patient was transferred to the intensive care unit in stable condition. FINDINGS: Bilateral pulmonary emboli, mostly occlusive with associated pulmonary hypertension. IMPRESSION: 1. Bilateral main pulmonary emboli with associated pulmonary arterial hypertension. 2. Technically successful mechanical thrombectomy of the bilateral pulmonary arteries with reduction in pulmonary arterial hypertension. 3. Technically successful temporary left common femoral artery and common femoral vein vascular access for potential ECMO cannulation. These accesses were removed upon completion of the case as ECMO was not required. Ruthann Cancer, MD [facility] Radiology Electronically Signed   By: Ruthann Cancer MD   On: 04/16/2020 16:44   IR US Guide Vasc Access Right  Result Date: 04/16/2020 INDICATION: 50 year old male with history of intermediate high risk (PESI class 4) acute pulmonary embolism in the setting of chronic heart failure and right lower extremity deep vein thrombosis. EXAM: 1. Ultrasound-guided left common femoral artery access. 2. Ultrasound-guided left common femoral vein access. 3. Ultrasound-guided right common femoral vein access. 4. Pulmonary angiogram. 5. Pulmonary manometry. 6. Selective catheterization of the bilateral pulmonary arteries. 7. Mechanical thrombectomy of the bilateral pulmonary arteries. COMPARISON:  CT a chest from 04/15/2020 MEDICATIONS: None.  ANESTHESIA/SEDATION: The procedure was performed under monitored anesthesia care by the Department of Anesthesia. Please refer to anesthesia record for medication administration details. FLUOROSCOPY TIME:  Fluoroscopy Time: 40 minutes 24 seconds (757 mGy).  COMPLICATIONS: None immediate. TECHNIQUE: Informed written consent was obtained from the patient after a thorough discussion of the procedural risks, benefits and alternatives. All questions were addressed. Maximal Sterile Barrier Technique was utilized including caps, mask, sterile gowns, sterile gloves, sterile drape, hand hygiene and skin antiseptic. A timeout was performed prior to the initiation of the procedure. The bilateral groins were prepped and draped in standard fashion. Given propensity of clinical decompensation, request was made for vascular access in the setting of possible ECMO cannulation. Therefore, the left common femoral artery and common femoral vein were evaluated with ultrasound. Subdermal Local anesthesia was administered with 1% lidocaine at the planned needle entry sites. Small skin nicks were made with an 11 blade. Under direct ultrasound visualization, the left common femoral artery was punctured with a 21 gauge micropuncture set. A 4/5 French slender sheath was then placed, and this sheath was used for arterial monitoring during the procedure. Next, the left common femoral vein was punctured under direct ultrasound visualization. A 4/5 French slender sheath was then placed. Attention was then turned to the right groin. Ultrasound evaluation demonstrated a patent right common femoral vein with compressibility. The procedure was planned. Subdermal Local anesthesia was administered at the planned needle entry site. A small skin nick was made. Under direct ultrasound visualization, a 21 gauge micropuncture needle was used to puncture the right common femoral vein. The micropuncture set was exchanged over a J wire for a 5 [place], 11 cm  vascular sheath. The wire and a pigtail catheter were directed to the perihepatic inferior vena cava. The wire was removed and exchanged for a tip deflecting wire. Under fluoroscopic guidance, the tip deflecting wire and pigtail catheter were guided through the right atrium and right ventricle into the main pulmonary artery without difficulty. Pulmonary artery pressures were then measured (69/33, mean 45 mmHg). Pulmonary angiogram was performed which demonstrated large filling defects in the bilateral main pulmonary arteries with limited peripheral perfusion. The left pulmonary artery was then selected with an Amplatz wire. The pigtail catheter and 5 French sheath were removed. Serial dilation was performed and a 12 French, 35 cm dry seal sheath was placed. Through the sheath, the Penumbra CAT 12 thrombectomy catheter was directed into the proximal left pulmonary artery. Mechanical suction thrombectomy was performed using the separator. Multiple passes were made targeting the upper and lower lobe segmental branches. The thrombectomy catheter was removed and flushed. Repeat left-sided pulmonary angiogram was performed with the pigtail catheter which demonstrated significantly improved clot burden and perfusion of the left lung. Attention was then turned toward the right pulmonary artery. A stiff Glidewire was used to select the right inferior lobar pulmonary artery, over which the CAT 12 thrombectomy catheter was advanced. Multiple passes performing section thrombectomy were performed using the separator. The thrombectomy catheter was removed in flush. Repeat right-sided pulmonary angiogram was performed with the pigtail catheters demonstrated significant improved clot burden of perfusion to the right lung. The pigtail catheter was retracted into the main pulmonary artery and repeat pressures were obtained (50/33, mean 38 mmHg). Completion pulmonary angiogram demonstrated some residual bilateral lobar and segmental  clot burden, however significantly improved from comparison with overall significantly improved bilateral pulmonary arterial perfusion. At this point, the patient noted to breathing easier. The catheters and sheaths were removed and manual compression was applied bilaterally without complication. The patient was transferred to the intensive care unit in stable condition. FINDINGS: Bilateral pulmonary emboli, mostly occlusive with associated pulmonary hypertension. IMPRESSION: 1. Bilateral main pulmonary  emboli with associated pulmonary arterial hypertension. 2. Technically successful mechanical thrombectomy of the bilateral pulmonary arteries with reduction in pulmonary arterial hypertension. 3. Technically successful temporary left common femoral artery and common femoral vein vascular access for potential ECMO cannulation. These accesses were removed upon completion of the case as ECMO was not required. Ruthann Cancer, MD [facility] Radiology Electronically Signed   By: Ruthann Cancer MD   On: 04/16/2020 16:44   DG CHEST PORT 1 VIEW  Result Date: 04/16/2020 CLINICAL DATA:  Patient diagnosed with pulmonary embolus 04/15/2020. Cardiac dysfunction. EXAM: PORTABLE CHEST 1 VIEW COMPARISON:  Single-view of the chest and CT chest 04/15/2020. FINDINGS: The patient's right PICC projects just within the right atrium. Mild left basilar atelectasis is seen. Lungs otherwise clear. Cardiomegaly. No pneumothorax or pleural fluid. IMPRESSION: Left basilar atelectasis. Cardiomegaly without edema. Electronically Signed   By: Inge Rise M.D.   On: 04/16/2020 18:53     Medications:     Current Medications: . sodium chloride   Intravenous Once  . allopurinol  300 mg Oral Daily  . buPROPion  100 mg Oral BID  . Chlorhexidine Gluconate Cloth  6 each Topical Daily  . digoxin  0.125 mg Oral Daily  . feeding supplement  1 Container Oral TID BM  . melatonin  5 mg Oral QHS   . morphine      . pantoprazole (PROTONIX) IV  40 mg Intravenous Q12H  . potassium chloride  20 mEq Oral Q4H  . sodium chloride flush  10-40 mL Intracatheter Q12H  . sodium chloride flush  3 mL Intravenous Q12H  . sodium chloride flush  3 mL Intravenous Q12H  . spironolactone  12.5 mg Oral Daily  . sucralfate  1 g Oral TID WC & HS  . thiamine injection  100 mg Intravenous Daily    Infusions: . sodium chloride    . sodium chloride    . sodium chloride Stopped (04/16/20 1832)  . sodium chloride Stopped (04/16/20 1832)  . sodium chloride Stopped (04/16/20 1831)  . amiodarone 30 mg/hr (04/16/20 2253)  . fentaNYL infusion INTRAVENOUS    . heparin 2,450 Units/hr (04/17/20 0450)  . milrinone 0.125 mcg/kg/min (04/16/20 2254)  . piperacillin-tazobactam (ZOSYN)  IV 3.375 g (04/17/20 0140)  . potassium chloride 10 mEq (04/17/20 0841)  . promethazine (PHENERGAN) injection         Assessment/Plan   1. 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 Also R femoral DVT - s/p Penumbra clot extraction 3/18 - hemodynamically improved. - wean  iNO 40 -> 20  - d/w Dr. Vaughan Browner (CCM) - continue milrinone for now  2. 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  - cMRI LVEF 12% RVEF 56% - now complicated by RV failure from PE - continue milrinone. Co-ox 69% - CVP 10. Hold lasix today. - Continue spiro.  - Will start ARB soon - No b-blocker with shock  3. LV thrombus: -2.3 x 3.6 cm apical thrombus - continue heparin. Eventual switch to DOAC  4. Hypokalemia  - will supp  CRITICAL CARE Performed by: Glori Bickers  Total critical care time: 35 minutes  Critical care time was exclusive of separately billable procedures and treating other patients.  Critical care was necessary to treat or  prevent imminent or life-threatening deterioration.  Critical care was time spent personally by me (independent of midlevel providers or residents) on the following activities: development of treatment plan with patient and/or surrogate as well as nursing, discussions with consultants, evaluation of patient's response to treatment, examination of patient, obtaining history from patient or surrogate, ordering and performing treatments and interventions, ordering and review of laboratory studies, ordering and review of radiographic studies, pulse oximetry and re-evaluation of patient's condition.     Length of Stay: [place], MD  04/17/2020, 8:59 AM  Advanced Heart Failure Team Pager [phone] (M-F; [place])  Please contact [facility] for night-coverage after hours (4p -7a ) and weekends on [email or web address]    Glori Bickers, MD  8:59 AM

## 2020-04-17 NOTE — Progress Notes (Signed)
[place] for Heparin Indication: DVT/PE and apical thrombus  Allergies  Allergen Reactions  . Pseudoephedrine     REACTION: jittery    Patient Measurements: Height: 6\' 1"  (185.4 cm) Weight: 121.1 kg (266 lb 15.6 oz) IBW/kg (Calculated) : 79.9 Heparin Dosing Weight: 110 kg  Vital Signs: Temp: 96.6 F (35.9 C) (03/19 0806) Temp Source: Axillary (03/19 0806) BP: 95/62 (03/19 0806) Pulse Rate: 111 (03/19 0845)  Labs: Recent Labs    04/15/20 0659 04/15/20 0700 04/16/20 0406 04/16/20 0413 04/16/20 0445 04/16/20 0446 04/16/20 0515 04/16/20 1320 04/16/20 1549 04/16/20 1648 04/16/20 1727 04/16/20 2348 04/17/20 0505  HGB 16.3   < >  --   --  16.7  --   --    < > 16.7  --   --  14.5 14.2  HCT 49.4   < >  --   --  51.7  --   --    < > 49.0  --   --  46.1 44.4  PLT 175  --   --   --  189  --   --   --   --   --   --  167 163  LABPROT  --   --   --   --   --   --  18.5*  --   --   --   --   --   --   INR  --   --   --   --   --   --  1.6*  --   --   --   --   --   --   HEPARINUNFRC  --    < >  --    < >  --   --  0.24*  --   --  0.69  --  0.61 0.65  CREATININE 1.40*  --   --   --   --  1.40*  --   --   --   --   --   --  1.22  TROPONINIHS  --   --  60*  --   --   --   --   --   --   --  98*  --   --    < > = values in this interval not displayed.    Estimated Creatinine Clearance: 97.7 mL/min (by C-G formula based on SCr of 1.22 mg/dL).  Assessment: 50 y.o. male with RLE DVT and apical thrombus continuing on IV heparin. 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  CTA showed submassive bilateral PE with RHS, now s/p IR for thrombectomy 3/18. Heparin drip was not turned off at all during procedure. Patient did NOT receive thrombolytics.  Heparin level therapeutic (0.65) on gtt at 2450 units/hr. Pink tinged sputum still  noted but stable. Continue heparin over the weekend and if stable convert to oral AC monday H/h stable pltc stable fibrinogen 600   Goal of Therapy:  Heparin level 0.3-0.7 units/ml Monitor platelets by anticoagulation protocol: Yes   Plan:  Continue heparin at 2450 units/hr  Monitor daily CBC, s/sx bleeding    Bonnita Nasuti Pharm.D. CPP, BCPS Clinical Pharmacist [phone] 04/17/2020 9:30 AM    Please see amion for complete clinical pharmacist phone list 04/17/2020 9:28 AM

## 2020-04-17 NOTE — Plan of Care (Signed)

## 2020-04-17 NOTE — Progress Notes (Signed)
NAME:  Kevin Baxter, MRN:  [id number], DOB:  12-05-1970, LOS: 5 ADMISSION DATE:  04/11/2020, CONSULTATION DATE:  3/18 REFERRING MD:  Hospitalist, CHIEF COMPLAINT:  Shortness of breath   History of Present Illness:  Kevin Baxter is a 50 y.o.M who presented to [facility] on 3/13 with reports of, GERD, bloating, N/V since January 2022.  In Jan 2022 he was admitted with abdominal pain, N/V.  CT imaging at that time was worrisome for possible pancreatitis with CT imaging notable for expansion of the pancreatic tail, adjacent fat stranding and edema (lipase in 70-80's), no gallstones. Of note he has been using Fever Few, [place] and [place] as supplements.  He was treated conservatively and discharged home.  He has been dealing with depression since at least January. No hx of smoking, ETOH or illicit drugs.  He followed up with primary care and was treated for GERD.    He was seen again in the ER on 3/3 for upper chest pain and difficulty swallowing. Prior EGD in 05/2019 showed esophagitis (no reports of difficulty with anesthesia).  He was treated for GERD with improvement in acute symptoms. Of note CXR at that time demonstrated cardiomegaly and mild vascular congestion.    The patient underwent EGD on 3/10 and had difficulties waking from anesthesia and loss of vision and was sent to the ER for evaluation.  CT head imaging negative at that time.  CT ABD/Pelvis 3/10 showed near complete resolution of prior pancreatic findings.  On 3/13 he returned to the ER with N/V.  They associated it with post EGD.  He called the answering service and referred to the ER.  Initial evaluation notable for tachycardia, mildly elevated AST/ALT/lipase.  US of the abdomen was assessed which demonstrated GB sludge / wall thickening. NM HIDA scan was concerning for possible acute cholecystitis. GI / CCS consulted. He was diagnosed with biventricular heart failure, and acute right femoral DVT.  The heart failure team was consulted,  cardiac MRI revealed LV thrombus and severe RV dilation.  He was started on heparin and plan was for right heart cath.  He began having chest pain on 3/17 and CTA chest with acute central pulmonary emboli.  He remained short of breath and tachycardic with evidence of right heart strain, RV/LV ratio 1.2. R/LHC with normal coronaries, RV strain, PA 67/32, PCW 22.  Pt on milrinone for cath.  He had mild hemoptysis, likely in setting of PE.  IR consulted for possible thrombectomy.     Pertinent  Medical History  Gout - on allopurinol HTN - on losartan  Tadalafil  Anxiety / Depression  GERD, colon polyps  Significant Hospital Events: Including procedures, antibiotic start and stop dates in addition to other pertinent events   . 3/14 admit to hospitalists, DVT of the right femoral vein, started on heparin, started on Zosyn . 3/17 right heart cath with normal coronary arteries, severe and ICM EF 15%, moderate pulmonary hypertension . 3/18 Central submassive PE, PCCM consult. Underwent IR thrombectomy. ECMO team on standby but did not need.  Required initiation of inhaled NO during procedure due to hypoxia.  Given IV amiodarone and IV Mg due to polymorphic VT   Interim History / Subjective:   Underwent IR thrombectomy successfully yesterday Currently on nitric oxide at 20 ppm and milrinone drip.  Has remained hemodynamically stable  Objective   Blood pressure 95/62, pulse (!) 108, temperature (!) 96.6 F (35.9 C), temperature source Axillary, resp. rate (!) 28, height  6\' 1"  (1.854 m), weight 121.1 kg, SpO2 96 %. CVP:  [3 mmHg-14 mmHg] 8 mmHg      Intake/Output Summary (Last 24 hours) at 04/17/2020 0825 Last data filed at 04/17/2020 0610 Gross per 24 hour  Intake 2021.77 ml  Output 2175 ml  Net -153.23 ml   Filed Weights   04/15/20 0623 04/16/20 0534 04/17/20 0421  Weight: 127.2 kg 124.9 kg 121.1 kg    Blood pressure 95/62, pulse (!) 108, temperature (!) 96.6 F (35.9 C), temperature  source Axillary, resp. rate (!) 28, height 6\' 1"  (1.854 m), weight 121.1 kg, SpO2 96 %. Gen:      No acute distress, chronically ill appearing HEENT:  EOMI, sclera anicteric Neck:     No masses; no thyromegaly Lungs:    Clear to auscultation bilaterally; normal respiratory effort CV:         Regular rate and rhythm; no murmurs Abd:      + bowel sounds; soft, non-tender; no palpable masses, no distension Ext:    No edema; adequate peripheral perfusion Skin:      Warm and dry; no rash Neuro: alert and oriented x 3 Psych: normal mood and affect   Labs/imaging personally reviewed   Creatinine 1.22, bilirubin elevated at 2.3   Resolved Hospital Problem list     Assessment & Plan:   Acute Central Submassive Pulmonary Embolism, Femoral DVT Hemoptysis  S/p mechanical thrombectomy by IR Continue monitoring in ICU Heparin drip Autoimmune panel is pending Reduce inhaled NO to 20ppm  Acute biventricular heart failure with LV thrombus, atrial fibrillation Heart failure team following, s/p right heart cath. No prior imaging but 04/01/20 CXR suggestive of cardiomegaly.  NYHA class III symptoms Continue milrinone, inhaled nitric oxide Digoxin, IV amio Heart failure team is on board  AKI Monitor urine output and creatinine  Acute Cholecystitis, Pancreatitis, Elevated LFTs HIDA positive for acute cholecystitis, no stones but did have sludge > symptoms may be related to CHF / congestion Not a candidate for surgery at an current state Continue antibiotics for total of 10 days Monitor LFTs, lipase  Gout Allopurinol  Depression / Anxiety  Wellbutrin   Best practice (evaluated daily)  Diet:  Oral Pain/Anxiety/Delirium protocol (if indicated): No VAP protocol (if indicated): Not indicated DVT prophylaxis: Systemic AC GI prophylaxis: N/A and PPI Glucose control:  SSI No Central venous access:  N/A Arterial line:  N/A Foley:  N/A Mobility:  bed rest  PT consulted: N/A Last date  of multidisciplinary goals of care discussion [per primary] Code Status:  full code Disposition: ICU  Critical care time:    The patient is critically ill with multiple organ system failure and requires high complexity decision making for assessment and support, frequent evaluation and titration of therapies, advanced monitoring, review of radiographic studies and interpretation of complex data.   Critical Care Time devoted to patient care services, exclusive of separately billable procedures, described in this note is 35 minutes.   Marshell Garfinkel MD [facility] See Amion for pager  If no response to pager , please call [phone] until 7pm After 7:00 pm call Elink  [phone] 04/17/2020, 8:52 AM

## 2020-04-18 ENCOUNTER — Inpatient Hospital Stay (HOSPITAL_COMMUNITY): Payer: 59

## 2020-04-18 DIAGNOSIS — R000 Tachycardia, unspecified: Secondary | ICD-10-CM | POA: Diagnosis not present

## 2020-04-18 DIAGNOSIS — I5082 Biventricular heart failure: Secondary | ICD-10-CM | POA: Diagnosis not present

## 2020-04-18 DIAGNOSIS — I5021 Acute systolic (congestive) heart failure: Secondary | ICD-10-CM | POA: Diagnosis not present

## 2020-04-18 DIAGNOSIS — R112 Nausea with vomiting, unspecified: Secondary | ICD-10-CM | POA: Diagnosis not present

## 2020-04-18 LAB — COMPREHENSIVE METABOLIC PANEL
ALT: 51 U/L — ABNORMAL HIGH (ref 0–44)
AST: 34 U/L (ref 15–41)
Albumin: 2.2 g/dL — ABNORMAL LOW (ref 3.5–5.0)
Alkaline Phosphatase: 52 U/L (ref 38–126)
Anion gap: 7 (ref 5–15)
BUN: 18 mg/dL (ref 6–20)
CO2: 34 mmol/L — ABNORMAL HIGH (ref 22–32)
Calcium: 8.5 mg/dL — ABNORMAL LOW (ref 8.9–10.3)
Chloride: 92 mmol/L — ABNORMAL LOW (ref 98–111)
Creatinine, Ser: 1.04 mg/dL (ref 0.61–1.24)
GFR, Estimated: >60 mL/min (ref >60)
Glucose, Bld: 116 mg/dL — ABNORMAL HIGH (ref 70–99)
Potassium: 3.6 mmol/L (ref 3.5–5.1)
Sodium: 133 mmol/L — ABNORMAL LOW (ref 135–145)
Total Bilirubin: 3.6 mg/dL — ABNORMAL HIGH (ref 0.3–1.2)
Total Protein: 5.9 g/dL — ABNORMAL LOW (ref 6.5–8.1)

## 2020-04-18 LAB — HEPARIN LEVEL (UNFRACTIONATED)
Heparin Unfractionated: 0.96 IU/mL — ABNORMAL HIGH (ref 0.30–0.70)
Heparin Unfractionated: 0.75 IU/mL — ABNORMAL HIGH (ref 0.30–0.70)
Heparin Unfractionated: 0.57 IU/mL (ref 0.30–0.70)

## 2020-04-18 LAB — LIPASE, BLOOD: Lipase: 156 U/L — ABNORMAL HIGH (ref 11–51)

## 2020-04-18 LAB — COOXEMETRY PANEL
Carboxyhemoglobin: 1.2 % (ref 0.5–1.5)
Methemoglobin: 0.9 % (ref 0.0–1.5)
O2 Saturation: 69.3 %
Total hemoglobin: 13.5 g/dL (ref 12.0–16.0)

## 2020-04-18 LAB — CBC
HCT: 42.4 % (ref 39.0–52.0)
Hemoglobin: 13.4 g/dL (ref 13.0–17.0)
MCH: 25.3 pg — ABNORMAL LOW (ref 26.0–34.0)
MCHC: 31.6 g/dL (ref 30.0–36.0)
MCV: 80 fL (ref 80.0–100.0)
Platelets: 179 10*3/uL (ref 150–400)
RBC: 5.3 MIL/uL (ref 4.22–5.81)
RDW: 15.9 % — ABNORMAL HIGH (ref 11.5–15.5)
WBC: 7.8 10*3/uL (ref 4.0–10.5)
nRBC: 0 % (ref 0.0–0.2)

## 2020-04-18 LAB — PHOSPHORUS: Phosphorus: 2.9 mg/dL (ref 2.5–4.6)

## 2020-04-18 LAB — MAGNESIUM: Magnesium: 2 mg/dL (ref 1.7–2.4)

## 2020-04-18 IMAGING — DX DG ABD PORTABLE 1V
2 series · 2 of 2 positions shown · non-contrast
Comparison: CT abdomen pelvis [DATE]

CLINICAL DATA: Intractable nausea vomiting.

EXAM:
PORTABLE ABDOMEN - 1 VIEW

[abdomen kub (1 of 2)]
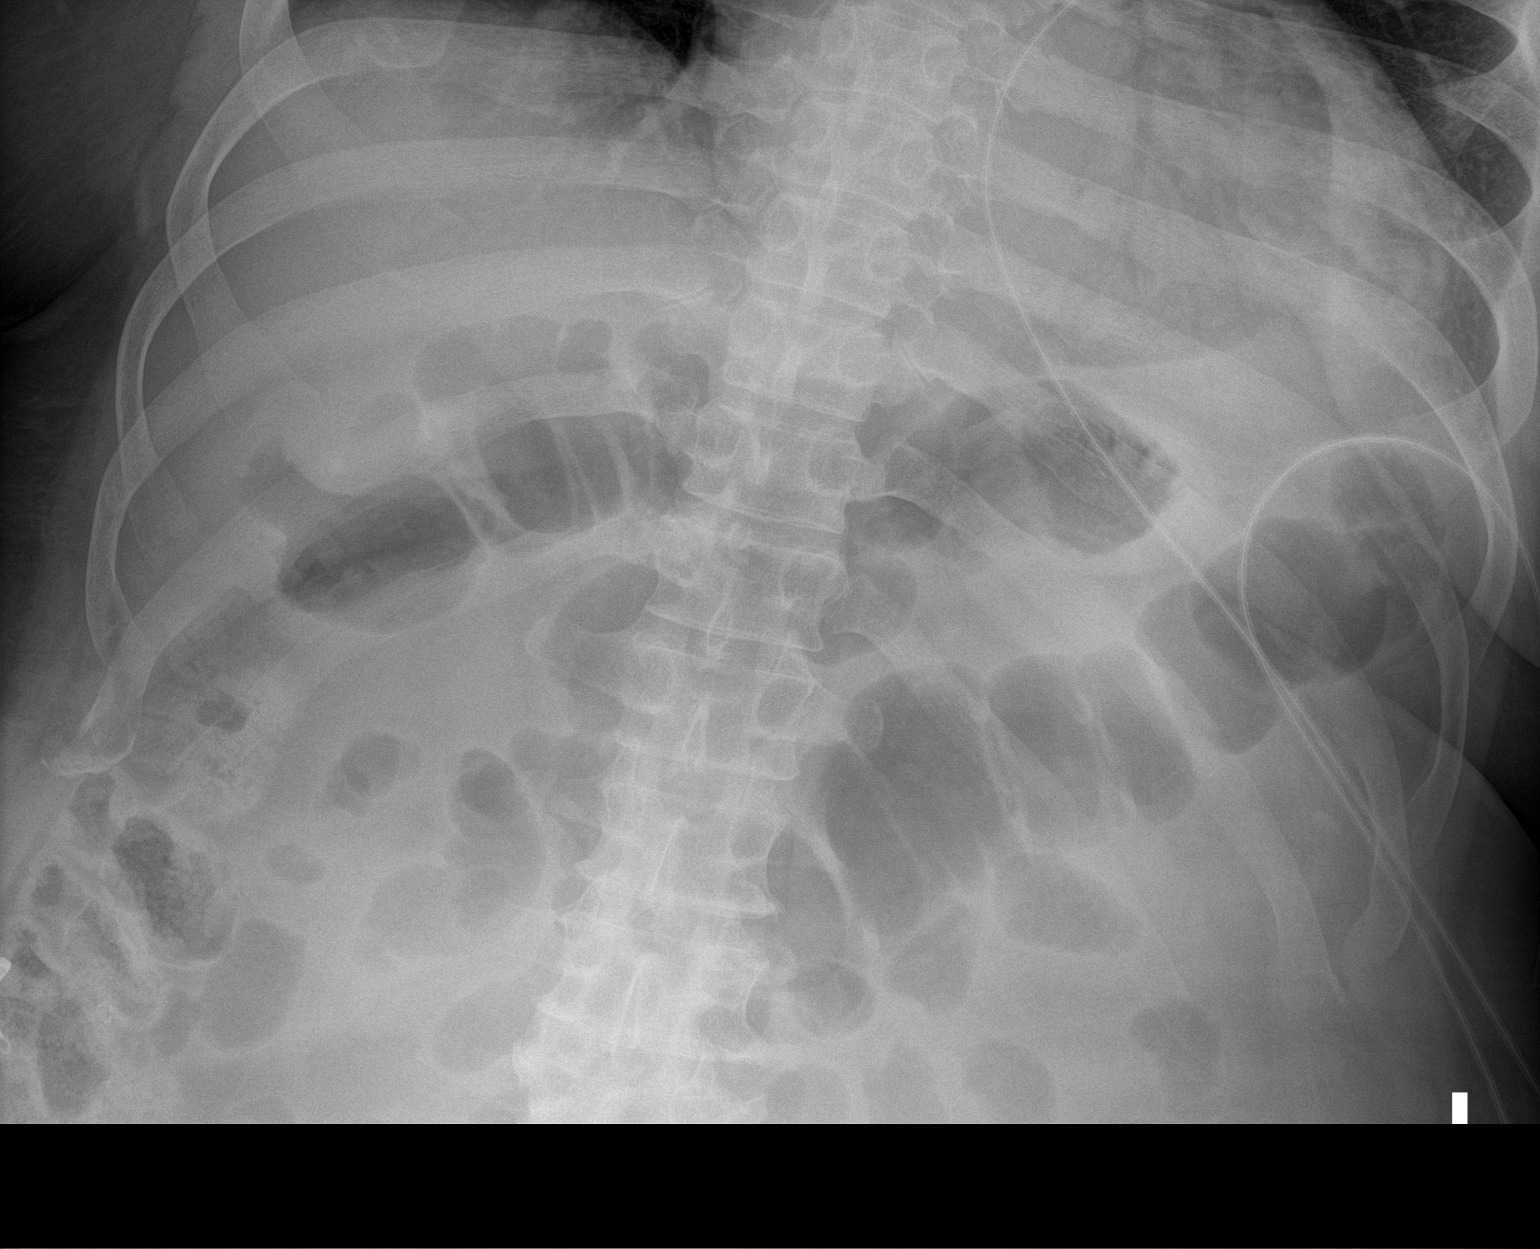

[abdomen kub (2 of 2)]
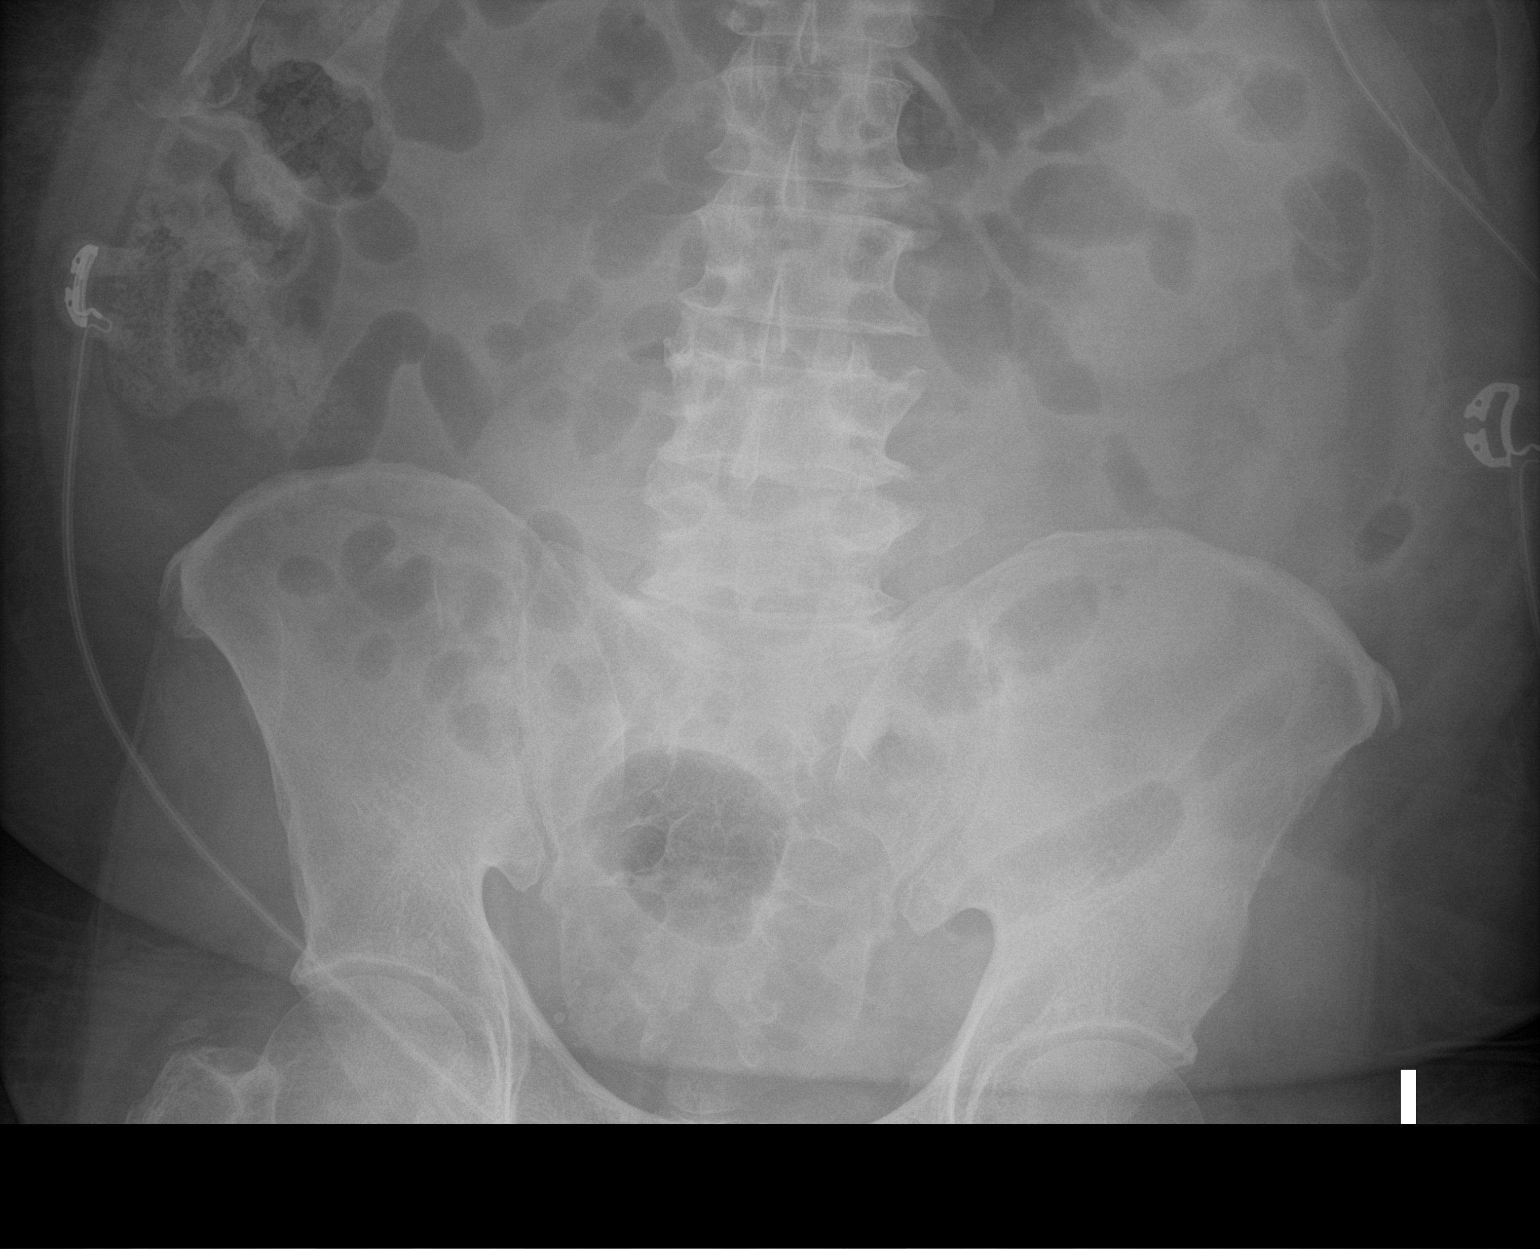

[2 of 2 positions shown; findings below may reference images not displayed]

FINDINGS: The bowel gas pattern is normal. No radio-opaque calculi or other
significant radiographic abnormality are seen. Patchy opacity of
medial left lung base is identified.
IMPRESSION: No bowel obstruction.

## 2020-04-18 MED ORDER — POTASSIUM CHLORIDE 10 MEQ/50ML IV SOLN
10.0000 meq | INTRAVENOUS | Status: AC
Start: 2020-04-18 — End: 2020-04-18
  Administered 2020-04-18 (×4): 10 meq via INTRAVENOUS
  Filled 2020-04-18 (×4): qty 50

## 2020-04-18 MED ORDER — PANTOPRAZOLE SODIUM 40 MG IV SOLR
40.0000 mg | Freq: Two times a day (BID) | INTRAVENOUS | Status: DC
  Administered 2020-04-18 (×2): 40 mg via INTRAVENOUS
  Filled 2020-04-18 (×2): qty 40

## 2020-04-18 MED ORDER — SORBITOL 70 % SOLN
30.0000 mL | Freq: Once | Status: AC
  Administered 2020-04-18: 30 mL via ORAL
  Filled 2020-04-18: qty 30

## 2020-04-18 MED ORDER — RAMELTEON 8 MG PO TABS
8.0000 mg | ORAL_TABLET | Freq: Every day | ORAL | Status: DC
  Administered 2020-04-18 – 2020-04-23 (×6): 8 mg via ORAL
  Filled 2020-04-18 (×8): qty 1

## 2020-04-18 MED ORDER — PROCHLORPERAZINE EDISYLATE 10 MG/2ML IJ SOLN
10.0000 mg | Freq: Four times a day (QID) | INTRAMUSCULAR | Status: DC | PRN
  Administered 2020-04-23: 10 mg via INTRAVENOUS
  Filled 2020-04-18 (×3): qty 2

## 2020-04-18 MED ORDER — POLYETHYLENE GLYCOL 3350 17 G PO PACK
17.0000 g | PACK | Freq: Every day | ORAL | Status: DC
  Administered 2020-04-20 – 2020-04-23 (×4): 17 g via ORAL
  Filled 2020-04-18 (×5): qty 1

## 2020-04-18 NOTE — Progress Notes (Signed)
[facility] for Heparin Indication: DVT/PE and apical thrombus  Allergies  Allergen Reactions  . Pseudoephedrine     REACTION: jittery    Patient Measurements: Height: 6\' 1"  (185.4 cm) Weight: 120.3 kg (265 lb 3.4 oz) IBW/kg (Calculated) : 79.9 Heparin Dosing Weight: 110 kg  Vital Signs: Temp: 97.6 F (36.4 C) (03/20 0317) Temp Source: Oral (03/20 0317) BP: 110/80 (03/20 0600) Pulse Rate: 112 (03/20 0600)  Labs: Recent Labs    04/15/20 0659 04/15/20 0700 04/16/20 0406 04/16/20 0413 04/16/20 0446 04/16/20 0515 04/16/20 1320 04/16/20 1727 04/16/20 2348 04/17/20 0505 04/18/20 0555  HGB 16.3   < >  --    < >  --   --    < >  --  14.5 14.2 13.4  HCT 49.4   < >  --    < >  --   --    < >  --  46.1 44.4 42.4  PLT 175  --   --    < >  --   --   --   --  167 163 179  LABPROT  --   --   --   --   --  18.5*  --   --   --   --   --   INR  --   --   --   --   --  1.6*  --   --   --   --   --   HEPARINUNFRC  --    < >  --    < >  --  0.24*   < >  --  0.61 0.65 0.96*  CREATININE 1.40*  --   --   --  1.40*  --   --   --   --  1.22  --   TROPONINIHS  --   --  60*  --   --   --   --  98*  --   --   --    < > = values in this interval not displayed.    Estimated Creatinine Clearance: 97.4 mL/min (by C-G formula based on SCr of 1.22 mg/dL).  Assessment: 50 y.o. male with RLE DVT and apical thrombus continuing on IV heparin. 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  CTA showed submassive bilateral PE with RHS, now s/p IR for thrombectomy 3/18. Heparin drip was not turned off at all during procedure. Patient did NOT receive thrombolytics.  Heparin level now up to supratherapeutic (0.96) on gtt at 2450 units/hr. No more pink-tinged sputum per RN. Heparin is running in central line and RN drawing from central line but holding and then  flushing (this is way it has been drawn for past 3 draws per RN).  Plan to continue heparin over the weekend and if stable convert to oral [facility] Monday.  Goal of Therapy:  Heparin level 0.3-0.7 units/ml Monitor platelets by anticoagulation protocol: Yes   Plan:  Decrease heparin to 2300 units/hr  Will check heparin level in 6 hours (peripheral stick)  Sherlon Handing, PharmD, BCPS Please see amion for complete clinical pharmacist phone list 04/18/2020 6:26 AM

## 2020-04-18 NOTE — Progress Notes (Addendum)
Advanced Heart Failure Team Rounding Note   Primary Physician: Kathlene November Primary Cardiologist:  Fransico Him  Reason for Consultation: Biventricular CHF  HPI:    Events - 3/17 cath with normal cor. RV strain on RHC.  - Post cath developed CP and hemoptysis. CT with submassive bilateral PE and RUL and RML infarcts.  - 3/18 underwent Penumbra clot extraction   He remains on milrinone 0.125, and IV heparin. Weaning iNO. Co-ox 69% Lipase 170 -> 156. LFTs ok.   Very nauseated. Unable to sleep. Zofran not helping. Compazine added earlier today.     Objective:    Vital Signs:   Temp:  [97.5 F (36.4 C)-98.2 F (36.8 C)] 97.7 F (36.5 C) (03/20 0743) Pulse Rate:  [105-121] 110 (03/20 0800) Resp:  [17-41] 23 (03/20 0800) BP: (94-128)/(59-97) 119/97 (03/20 0800) SpO2:  [92 %-100 %] 97 % (03/20 0800) Weight:  [120.3 kg] 120.3 kg (03/20 0317) Last BM Date: 04/13/20  Weight change: Filed Weights   04/16/20 0534 04/17/20 0421 04/18/20 0317  Weight: 124.9 kg 121.1 kg 120.3 kg    Intake/Output:   Intake/Output Summary (Last 24 hours) at 04/18/2020 1025 Last data filed at 04/18/2020 0800 Gross per 24 hour  Intake 1119.3 ml  Output 1475 ml  Net -355.7 ml      Physical Exam    General:  Lying in bed  Nauseated. Uncomfortable  HEENT: normal Neck: supple.JVP up midly. Carotids 2+ bilat; no bruits. No lymphadenopathy or thryomegaly appreciated. Cor: PMI nondisplaced. Regular tachy 2/6 TR Lungs: clear Abdomen: soft, nontender, nondistended. No hepatosplenomegaly. No bruits or masses. Good bowel sounds. Extremities: no cyanosis, clubbing, rash, edema Neuro: alert & orientedx3, cranial nerves grossly intact. moves all 4 extremities w/o difficulty. Affect pleasant   Telemetry   Sinus tach 110-120 Personally reviewed   Labs   Basic Metabolic Panel: Recent Labs  Lab 04/11/20 1140 04/12/20 0027 04/14/20 0712 04/14/20 1850 04/15/20 0659 04/15/20 1527  04/16/20 0446 04/16/20 1320 04/16/20 1549 04/17/20 0505 04/18/20 0555  NA 135   < > 139 138 139   < > 136 137 138 134* 133*  K 4.5   < > 4.3 3.5 3.8   < > 3.9 3.7 3.6 3.3* 3.6  CL 102   < > 101 94* 90*  --  89*  --   --  90* 92*  CO2 21*   < > 27 33* 34*  --  35*  --   --  35* 34*  GLUCOSE 125*   < > 114* 140* 116*  --  134*  --   --  177* 116*  BUN 24*   < > 24* 24* 24*  --  27*  --   --  26* 18  CREATININE 1.12   < > 1.31* 1.28* 1.40*  --  1.40*  --   --  1.22 1.04  CALCIUM 8.6*   < > 9.1 8.8* 8.8*  --  9.0  --   --  8.6* 8.5*  MG 2.0  --   --   --   --   --   --   --   --   --  2.0  PHOS  --   --  4.9*  --  5.2*  --  4.5  --   --  3.4 2.9   < > = values in this interval not displayed.    Liver Function Tests: Recent Labs  Lab 04/14/20 [phone] 04/15/20 0659 04/16/20 0445  04/16/20 0446 04/17/20 0505 04/18/20 0555  AST 62* 56* 52*  --  38 34  ALT 132* 111* 92*  --  62* 51*  ALKPHOS 61 63 65  --  55 52  BILITOT 4.6* 5.0* 5.0*  --  4.1* 3.6*  PROT 6.4* 6.8 7.0  --  6.2* 5.9*  ALBUMIN 2.6* 2.7*  2.6* 2.6* 2.6* 2.3*  2.4* 2.2*   Recent Labs  Lab 04/12/20 0027 04/13/20 0041 04/14/20 0713 04/15/20 0659 04/16/20 0445 04/17/20 0505 04/18/20 0555  LIPASE 155*   < > 142* 165* 152* 170* 156*  AMYLASE 159*  --   --   --   --   --   --    < > = values in this interval not displayed.   No results for input(s): AMMONIA in the last 168 hours.  CBC: Recent Labs  Lab 04/11/20 1140 04/11/20 1841 04/15/20 0659 04/15/20 1527 04/16/20 0445 04/16/20 1320 04/16/20 1549 04/16/20 2348 04/17/20 0505 04/18/20 0555  WBC 6.9   < > 7.5  --  8.1  --   --  10.1 9.4 7.8  NEUTROABS 4.9  --   --   --   --   --   --   --   --   --   HGB 16.2   < > 16.3   < > 16.7 18.4* 16.7 14.5 14.2 13.4  HCT 50.2   < > 49.4   < > 51.7 54.0* 49.0 46.1 44.4 42.4  MCV 79.4*   < > 77.4*  --  78.3*  --   --  80.5 79.1* 80.0  PLT 146*   < > 175  --  189  --   --  167 163 179   < > = values in this interval  not displayed.    Cardiac Enzymes: No results for input(s): CKTOTAL, CKMB, CKMBINDEX, TROPONINI in the last 168 hours.  BNP: BNP (last 3 results) Recent Labs    04/12/20 1216 04/16/20 0406  BNP 1,153.9* 885.4*    ProBNP (last 3 results) No results for input(s): PROBNP in the last 8760 hours.   CBG: No results for input(s): GLUCAP in the last 168 hours.  Coagulation Studies: Recent Labs    04/16/20 0515  LABPROT 18.5*  INR 1.6*     Imaging   No results found.   Medications:     Current Medications: . sodium chloride   Intravenous Once  . allopurinol  300 mg Oral Daily  . buPROPion  100 mg Oral BID  . Chlorhexidine Gluconate Cloth  6 each Topical Daily  . digoxin  0.125 mg Oral Daily  . feeding supplement  1 Container Oral TID BM  . morphine      . pantoprazole (PROTONIX) IV  40 mg Intravenous Q12H  . ramelteon  8 mg Oral QHS  . sodium chloride flush  10-40 mL Intracatheter Q12H  . sodium chloride flush  3 mL Intravenous Q12H  . sodium chloride flush  3 mL Intravenous Q12H  . spironolactone  12.5 mg Oral Daily  . thiamine  100 mg Oral Daily    Infusions: . sodium chloride    . sodium chloride    . sodium chloride Stopped (04/16/20 1832)  . sodium chloride Stopped (04/16/20 1832)  . sodium chloride Stopped (04/16/20 1831)  . heparin 2,300 Units/hr (04/18/20 0800)  . milrinone 0.125 mcg/kg/min (04/17/20 2027)  . piperacillin-tazobactam (ZOSYN)  IV Stopped (04/18/20 0547)  . potassium chloride 10 mEq (  04/18/20 0846)       Assessment/Plan   1. 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 Also R femoral DVT - s/p Penumbra clot extraction 3/18 - hemodynamically improved. - can wean iNO - continue milrinone for now - Repeat limited echo tomorrow  2. Acute systolic biventricular CHF with cardiogenic shock: -ECHO with large LV thrombus, EF <20%, RV dysfunction, dilated LV, no real valve disease, + for RWMA, diastolic  function not evaluated - cath 3/18 normal cors.  - cMRI LVEF 12% RVEF 28% - also complicated by RV failure from PE - continue milrinone. Co-ox 69% - Continue to hold lasix today. - Continue spiro.  - Will start ARB soon - No b-blocker with shock  3. LV thrombus: -2.3 x 3.6 cm apical thrombus - continue heparin. Eventual switch to DOAC - no bleeding currently  4. Hypokalemia  - will supp  5. Severe nausea - unclear etiology. - on admit had concern for acute cholecystitis but I suspect due more to RV failure - continue zofran. Compazine added. Watch QT - will get KUB and increase bowel regimen. Ambulate  CRITICAL CARE Performed by: Glori Bickers  Total critical care time: 35 minutes  Critical care time was exclusive of separately billable procedures and treating other patients.  Critical care was necessary to treat or prevent imminent or life-threatening deterioration.  Critical care was time spent personally by me (independent of midlevel providers or residents) on the following activities: development of treatment plan with patient and/or surrogate as well as nursing, discussions with consultants, evaluation of patient's response to treatment, examination of patient, obtaining history from patient or surrogate, ordering and performing treatments and interventions, ordering and review of laboratory studies, ordering and review of radiographic studies, pulse oximetry and re-evaluation of patient's condition.     Length of Stay: [place], MD  04/18/2020, 10:25 AM  Advanced Heart Failure Team Pager [phone] (M-F; 7a - 4p)  Please contact [facility] for night-coverage after hours (4p -7a ) and weekends on [email or web address]    Glori Bickers, MD  10:25 AM

## 2020-04-18 NOTE — Progress Notes (Signed)
NAME:  Kevin Baxter, MRN:  [id number], DOB:  1970-07-28, LOS: 6 ADMISSION DATE:  04/11/2020, CONSULTATION DATE:  3/18 REFERRING MD:  Hospitalist, CHIEF COMPLAINT:  Shortness of breath   History of Present Illness:  Kevin Baxter is a 50 y.o.M who presented to [facility] on 3/13 with reports of, GERD, bloating, N/V since January 2022.  In Jan 2022 he was admitted with abdominal pain, N/V.  CT imaging at that time was worrisome for possible pancreatitis with CT imaging notable for expansion of the pancreatic tail, adjacent fat stranding and edema (lipase in 70-80's), no gallstones. Of note he has been using Fever Few, Sea Moss and Raymond as supplements.  He was treated conservatively and discharged home.  He has been dealing with depression since at least January. No hx of smoking, ETOH or illicit drugs.  He followed up with primary care and was treated for GERD.    He was seen again in the ER on 3/3 for upper chest pain and difficulty swallowing. Prior EGD in 05/2019 showed esophagitis (no reports of difficulty with anesthesia).  He was treated for GERD with improvement in acute symptoms. Of note CXR at that time demonstrated cardiomegaly and mild vascular congestion.    The patient underwent EGD on 3/10 and had difficulties waking from anesthesia and loss of vision and was sent to the ER for evaluation.  CT head imaging negative at that time.  CT ABD/Pelvis 3/10 showed near complete resolution of prior pancreatic findings.  On 3/13 he returned to the ER with N/V.  They associated it with post EGD.  He called the answering service and referred to the ER.  Initial evaluation notable for tachycardia, mildly elevated AST/ALT/lipase.  US of the abdomen was assessed which demonstrated GB sludge / wall thickening. NM HIDA scan was concerning for possible acute cholecystitis. GI / CCS consulted. He was diagnosed with biventricular heart failure, and acute right femoral DVT.  The heart failure team was consulted,  cardiac MRI revealed LV thrombus and severe RV dilation.  He was started on heparin and plan was for right heart cath.  He began having chest pain on 3/17 and CTA chest with acute central pulmonary emboli.  He remained short of breath and tachycardic with evidence of right heart strain, RV/LV ratio 1.2. R/LHC with normal coronaries, RV strain, PA 67/32, PCW 22.  Pt on milrinone for cath.  He had mild hemoptysis, likely in setting of PE.  IR consulted for possible thrombectomy.     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 Underwent IR thrombectomy. ECMO team on standby but did not need.  Required initiation of inhaled NO during procedure due to hypoxia.  Given IV amiodarone and IV Mg due to polymorphic VT   Interim History / Subjective:   Continuing on milrinone, inhaled nitric oxide Complains of ongoing nausea  Objective   Blood pressure 114/78, pulse (!) 112, temperature 97.7 F (36.5 C), resp. rate (!) 27, height 6\' 1"  (1.854 m), weight 120.3 kg, SpO2 97 %. CVP:  [2 mmHg-11 mmHg] 2 mmHg  Intake/Output Summary (Last 24 hours) at 04/18/2020 0802 Last data filed at 04/18/2020 0601 Gross per 24 hour  Intake 1201.37 ml  Output 1475 ml  Net -273.63 ml   Filed Weights   04/16/20 0534 04/17/20 0421 04/18/20 0317  Weight: 124.9 kg 121.1 kg 120.3 kg    Gen:      No acute distress HEENT:  EOMI, sclera anicteric Neck:     No masses; no thyromegaly Lungs:    Clear to auscultation bilaterally; normal respiratory effort CV:         Regular  rate and rhythm; no murmurs Abd:      + bowel sounds; soft, non-tender; no palpable masses, no distension Ext:    No edema; adequate peripheral perfusion Skin:      Warm and dry; no rash Neuro: alert and oriented x 3 Psych: normal mood and affect   Labs/imaging personally reviewed    Significant for sodium 133, creatinine 1.04 Transaminases improving, total bilirubin down to 3.6 Lipase improving to 156 No new imaging  Resolved Hospital Problem list     Assessment & Plan:   Acute Central Submassive Pulmonary Embolism, Femoral DVT Hemoptysis  S/p mechanical thrombectomy by IR Continue monitoring in ICU Heparin drip Hypercoagulable panel is pending Wean off inhaled nitric oxide  Acute biventricular heart failure with LV thrombus, atrial fibrillation Heart failure team following, s/p right heart cath. No prior imaging but 04/01/20 CXR suggestive of cardiomegaly.  NYHA class III symptoms Continue milrinone, inhaled nitric oxide Digoxin, IV amio Heart failure team is on board  AKI Monitor urine output and creatinine  Acute Cholecystitis, Pancreatitis, Elevated LFTs HIDA positive for acute cholecystitis, no stones but did have sludge > symptoms may be related to CHF / congestion Not a candidate for surgery at an current state Continue antibiotics for total of 10 days Monitor LFTs, lipase  Nausea EGD on 3/11 was unremarkable Continue Zofran, add Compazine Change PPI to IV  Gout Allopurinol  Depression / Anxiety Insomnia Wellbutrin Added Remeron at night   Best practice (evaluated daily)  Diet:  Oral Pain/Anxiety/Delirium protocol (if indicated): No VAP protocol (if indicated): Not indicated DVT prophylaxis: Systemic AC GI prophylaxis: N/A and PPI Glucose control:  SSI No Central venous access:  N/A Arterial line:  N/A Foley:  N/A Mobility:  bed rest  PT consulted: N/A Last date of multidisciplinary goals of care discussion [per primary] Code Status:  full  code Disposition: ICU  Critical care time:    The patient is critically ill with multiple organ system failure and requires high complexity decision making for assessment and support, frequent evaluation and titration of therapies, advanced monitoring, review of radiographic studies and interpretation of complex data.   Critical Care Time devoted to patient care services, exclusive of separately billable procedures, described in this note is 35 minutes.   Marshell Garfinkel MD  Pulmonary & Critical care See Amion for pager  If no response to pager , please call [phone] until 7pm After 7:00 pm call Elink  [phone] 04/18/2020, 8:02 AM

## 2020-04-18 NOTE — Progress Notes (Signed)
[place] for Heparin Indication: DVT/PE and apical thrombus  Allergies  Allergen Reactions  . Pseudoephedrine     REACTION: jittery    Patient Measurements: Height: 6\' 1"  (185.4 cm) Weight: 120.3 kg (265 lb 3.4 oz) IBW/kg (Calculated) : 79.9 Heparin Dosing Weight: 110 kg  Vital Signs: Temp: 97.7 F (36.5 C) (03/20 0743) Temp Source: Oral (03/20 0317) BP: 128/95 (03/20 1200) Pulse Rate: 118 (03/20 1200)  Labs: Recent Labs    04/16/20 0406 04/16/20 0413 04/16/20 0446 04/16/20 0515 04/16/20 1320 04/16/20 1727 04/16/20 2348 04/17/20 0505 04/18/20 0555 04/18/20 1304  HGB  --    < >  --   --    < >  --  14.5 14.2 13.4  --   HCT  --    < >  --   --    < >  --  46.1 44.4 42.4  --   PLT  --    < >  --   --   --   --  167 163 179  --   LABPROT  --   --   --  18.5*  --   --   --   --   --   --   INR  --   --   --  1.6*  --   --   --   --   --   --   HEPARINUNFRC  --    < >  --  0.24*   < >  --  0.61 0.65 0.96* 0.75*  CREATININE  --   --  1.40*  --   --   --   --  1.22 1.04  --   TROPONINIHS 60*  --   --   --   --  98*  --   --   --   --    < > = values in this interval not displayed.    Estimated Creatinine Clearance: 114.2 mL/min (by C-G formula based on SCr of 1.04 mg/dL).  Assessment: 50 y.o. male with RLE DVT and apical thrombus continuing on IV heparin. 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  CTA showed submassive bilateral PE with RHS, now s/p IR for thrombectomy 3/18. Heparin drip was not turned off at all during procedure. Patient did NOT receive thrombolytics.  Heparin level now up to upper end of therapeutic (0.75 )on gtt at 2300 units/hr. No more pink-tinged sputum per RN. Heparin is running in central line and lab drawn from peripheral  Stick.   Plan to continue heparin over the weekend and if stable  convert to oral [facility] Monday.  Goal of Therapy:  Heparin level 0.3-0.7 units/ml Monitor platelets by anticoagulation protocol: Yes   Plan:  Decrease heparin to 2200 units/hr  Daily heparin level and cbc    Bonnita Nasuti Pharm.D. CPP, BCPS Clinical Pharmacist [phone] 04/18/2020 2:53 PM    Please see amion for complete clinical pharmacist phone list 04/18/2020 2:51 PM

## 2020-04-18 NOTE — Progress Notes (Signed)
Referring Physician(s): [place], DO  Supervising Physician: Arne Cleveland  Patient Status:  [facility] - In-pt  Chief Complaint:  Pulmonary embolism  Brief History:  Kevin Baxter is a 50 y.o. male admitted for right lower extremity DVT, acute on chronic heart failure who was found to have a left ventricular apical thrombus and bilateral main pulmonary emboli on CTA and cardiac MR done 04/15/20.    Underlying etiology of thrombus likely related to undiagnosed polycythemia and sedentary lifestyle.  He underwent bilateral pulmonary artery mechanical thrombectomy by Dr. Serafina Royals on April 16, 2020.  Subjective:  Doing well, sitting up in bed.  No complaints.  Allergies: Pseudoephedrine  Medications: Prior to Admission medications   Medication Sig Start Date End Date Taking? Authorizing Provider  allopurinol (ZYLOPRIM) 300 MG tablet Take 1 tablet (300 mg total) by mouth daily. 12/22/19  Yes Paz, Alda Berthold, MD  buPROPion (WELLBUTRIN) 100 MG tablet Take 1 tablet a day x 1 week, then 1 tab BID Patient taking differently: Take 100 mg by mouth 2 (two) times daily. 03/02/20  Yes Paz, Alda Berthold, MD  dicyclomine (BENTYL) 20 MG tablet Take 1 tablet (20 mg total) by mouth every 6 (six) hours as needed for spasms. Patient taking differently: Take 20 mg by mouth daily. 04/22/19  Yes Gatha Mayer, MD  losartan (COZAAR) 50 MG tablet Take 2 tablets (100 mg total) by mouth daily. 04/08/20  Yes Paz, Alda Berthold, MD  ondansetron (ZOFRAN ODT) 4 MG disintegrating tablet Take 1 tablet (4 mg total) by mouth every 8 (eight) hours as needed for nausea or vomiting. 04/09/20  Yes Carmin Muskrat, MD  sucralfate (CARAFATE) 1 g tablet Take 1 tablet (1 g total) by mouth 4 (four) times daily -  with meals and at bedtime. Patient taking differently: Take 1 g by mouth daily. 04/09/20  Yes Carmin Muskrat, MD  pantoprazole (PROTONIX) 40 MG tablet Take 1 tablet (40 mg total) by mouth daily. Patient not taking: No  sig reported 03/22/20   Shelda Pal, DO     Vital Signs: BP (!) 119/97 (BP Location: Left Arm)   Pulse (!) 115   Temp 97.7 F (36.5 C)   Resp (!) 27   Ht 6\' 1"  (1.854 m)   Wt 120.3 kg   SpO2 94%   BMI 34.99 kg/m   Physical Exam Constitutional:      Appearance: Normal appearance.  HENT:     Head: Normocephalic and atraumatic.  Cardiovascular:     Rate and Rhythm: Normal rate.  Pulmonary:     Effort: Pulmonary effort is normal. No respiratory distress.  Musculoskeletal:     Comments: Bilateral groin access sites without bleeding or hematoma  Skin:    General: Skin is warm and dry.  Neurological:     General: No focal deficit present.     Mental Status: He is alert and oriented to person, place, and time.  Psychiatric:        Mood and Affect: Mood normal.        Behavior: Behavior normal.        Thought Content: Thought content normal.        Judgment: Judgment normal.     Imaging: CT ANGIO CHEST PE W OR WO CONTRAST  Result Date: 04/16/2020 CLINICAL DATA:  Chest pain, left ventricular thrombus on MRI, cardiac dysfunction EXAM: CT ANGIOGRAPHY CHEST WITH CONTRAST TECHNIQUE: Multidetector CT imaging of the chest was performed using the standard protocol during  bolus administration of intravenous contrast. Multiplanar CT image reconstructions and MIPs were obtained to evaluate the vascular anatomy. CONTRAST:  75mL OMNIPAQUE IOHEXOL 350 MG/ML SOLN COMPARISON:  02/26/2020, 04/15/2020 FINDINGS: Cardiovascular: This is a technically adequate evaluation of the pulmonary vasculature. There are large central pulmonary emboli seen bilaterally within the main pulmonary arteries, and extending into the segmental branches left greater than right. There is severe clot burden, with evidence of right heart strain with the RV/LV ratio measuring approximately 1.2. The heart is enlarged without pericardial effusion. Normal caliber of the thoracic aorta. Mediastinum/Nodes: No enlarged  mediastinal, hilar, or axillary lymph nodes. Thyroid gland, trachea, and esophagus demonstrate no significant findings. Lungs/Pleura: Hypoventilatory changes are seen bilaterally within the lungs. Small peripheral areas of wedge-shaped consolidation are seen within the right upper and right middle lobes consistent with pulmonary infarcts. No effusion or pneumothorax. Central airways are patent. Upper Abdomen: No acute abnormality. Musculoskeletal: No acute or destructive bony lesions. Reconstructed images demonstrate no additional findings. Review of the MIP images confirms the above findings. IMPRESSION: 1. Acute bilateral central pulmonary emboli. Positive for acute PE with CT evidence of right heart strain (RV/LV Ratio = 1.2) consistent with at least submassive (intermediate risk) PE. The presence of right heart strain has been associated with an increased risk of morbidity and mortality. 2. Cardiomegaly. 3. Bilateral hypoventilatory changes, with scattered peripheral areas of consolidation concerning for developing pulmonary infarcts. Critical Value/emergent results were called by telephone at the time of interpretation on 04/16/2020 at 12:05 am to provider Dr. Ander Slade, who verbally acknowledged these results. Electronically Signed   By: Randa Ngo M.D.   On: 04/16/2020 00:11   IR Angiogram Pulmonary Bilateral Selective  Result Date: 04/16/2020 INDICATION: 50 year old male with history of intermediate high risk (PESI class 4) acute pulmonary embolism in the setting of chronic heart failure and right lower extremity deep vein thrombosis. EXAM: 1. Ultrasound-guided left common femoral artery access. 2. Ultrasound-guided left common femoral vein access. 3. Ultrasound-guided right common femoral vein access. 4. Pulmonary angiogram. 5. Pulmonary manometry. 6. Selective catheterization of the bilateral pulmonary arteries. 7. Mechanical thrombectomy of the bilateral pulmonary arteries. COMPARISON:  CT a  chest from 04/15/2020 MEDICATIONS: None. ANESTHESIA/SEDATION: The procedure was performed under monitored anesthesia care by the Department of Anesthesia. Please refer to anesthesia record for medication administration details. FLUOROSCOPY TIME:  Fluoroscopy Time: 40 minutes 24 seconds (757 mGy). COMPLICATIONS: None immediate. TECHNIQUE: Informed written consent was obtained from the patient after a thorough discussion of the procedural risks, benefits and alternatives. All questions were addressed. Maximal Sterile Barrier Technique was utilized including caps, mask, sterile gowns, sterile gloves, sterile drape, hand hygiene and skin antiseptic. A timeout was performed prior to the initiation of the procedure. The bilateral groins were prepped and draped in standard fashion. Given propensity of clinical decompensation, request was made for vascular access in the setting of possible ECMO cannulation. Therefore, the left common femoral artery and common femoral vein were evaluated with ultrasound. Subdermal Local anesthesia was administered with 1% lidocaine at the planned needle entry sites. Small skin nicks were made with an 11 blade. Under direct ultrasound visualization, the left common femoral artery was punctured with a 21 gauge micropuncture set. A 4/5 French slender sheath was then placed, and this sheath was used for arterial monitoring during the procedure. Next, the left common femoral vein was punctured under direct ultrasound visualization. A 4/5 French slender sheath was then placed. Attention was then turned to the right groin. Ultrasound  evaluation demonstrated a patent right common femoral vein with compressibility. The procedure was planned. Subdermal Local anesthesia was administered at the planned needle entry site. A small skin nick was made. Under direct ultrasound visualization, a 21 gauge micropuncture needle was used to puncture the right common femoral vein. The micropuncture set was  exchanged over a J wire for a 5 [place], 11 cm vascular sheath. The wire and a pigtail catheter were directed to the perihepatic inferior vena cava. The wire was removed and exchanged for a tip deflecting wire. Under fluoroscopic guidance, the tip deflecting wire and pigtail catheter were guided through the right atrium and right ventricle into the main pulmonary artery without difficulty. Pulmonary artery pressures were then measured (69/33, mean 45 mmHg). Pulmonary angiogram was performed which demonstrated large filling defects in the bilateral main pulmonary arteries with limited peripheral perfusion. The left pulmonary artery was then selected with an Amplatz wire. The pigtail catheter and 5 French sheath were removed. Serial dilation was performed and a 12 French, 35 cm dry seal sheath was placed. Through the sheath, the Penumbra CAT 12 thrombectomy catheter was directed into the proximal left pulmonary artery. Mechanical suction thrombectomy was performed using the separator. Multiple passes were made targeting the upper and lower lobe segmental branches. The thrombectomy catheter was removed and flushed. Repeat left-sided pulmonary angiogram was performed with the pigtail catheter which demonstrated significantly improved clot burden and perfusion of the left lung. Attention was then turned toward the right pulmonary artery. A stiff Glidewire was used to select the right inferior lobar pulmonary artery, over which the CAT 12 thrombectomy catheter was advanced. Multiple passes performing section thrombectomy were performed using the separator. The thrombectomy catheter was removed in flush. Repeat right-sided pulmonary angiogram was performed with the pigtail catheters demonstrated significant improved clot burden of perfusion to the right lung. The pigtail catheter was retracted into the main pulmonary artery and repeat pressures were obtained (50/33, mean 38 mmHg). Completion pulmonary angiogram demonstrated  some residual bilateral lobar and segmental clot burden, however significantly improved from comparison with overall significantly improved bilateral pulmonary arterial perfusion. At this point, the patient noted to breathing easier. The catheters and sheaths were removed and manual compression was applied bilaterally without complication. The patient was transferred to the intensive care unit in stable condition. FINDINGS: Bilateral pulmonary emboli, mostly occlusive with associated pulmonary hypertension. IMPRESSION: 1. Bilateral main pulmonary emboli with associated pulmonary arterial hypertension. 2. Technically successful mechanical thrombectomy of the bilateral pulmonary arteries with reduction in pulmonary arterial hypertension. 3. Technically successful temporary left common femoral artery and common femoral vein vascular access for potential ECMO cannulation. These accesses were removed upon completion of the case as ECMO was not required. Ruthann Cancer, MD [facility] Radiology Electronically Signed   By: Ruthann Cancer MD   On: 04/16/2020 16:44   IR Angiogram Selective Each Additional Vessel  Result Date: 04/16/2020 INDICATION: 50 year old male with history of intermediate high risk (PESI class 4) acute pulmonary embolism in the setting of chronic heart failure and right lower extremity deep vein thrombosis. EXAM: 1. Ultrasound-guided left common femoral artery access. 2. Ultrasound-guided left common femoral vein access. 3. Ultrasound-guided right common femoral vein access. 4. Pulmonary angiogram. 5. Pulmonary manometry. 6. Selective catheterization of the bilateral pulmonary arteries. 7. Mechanical thrombectomy of the bilateral pulmonary arteries. COMPARISON:  CT a chest from 04/15/2020 MEDICATIONS: None. ANESTHESIA/SEDATION: The procedure was performed under monitored anesthesia care by the Department of Anesthesia. Please refer  to anesthesia record  for medication administration details. FLUOROSCOPY TIME:  Fluoroscopy Time: 40 minutes 24 seconds (757 mGy). COMPLICATIONS: None immediate. TECHNIQUE: Informed written consent was obtained from the patient after a thorough discussion of the procedural risks, benefits and alternatives. All questions were addressed. Maximal Sterile Barrier Technique was utilized including caps, mask, sterile gowns, sterile gloves, sterile drape, hand hygiene and skin antiseptic. A timeout was performed prior to the initiation of the procedure. The bilateral groins were prepped and draped in standard fashion. Given propensity of clinical decompensation, request was made for vascular access in the setting of possible ECMO cannulation. Therefore, the left common femoral artery and common femoral vein were evaluated with ultrasound. Subdermal Local anesthesia was administered with 1% lidocaine at the planned needle entry sites. Small skin nicks were made with an 11 blade. Under direct ultrasound visualization, the left common femoral artery was punctured with a 21 gauge micropuncture set. A 4/5 French slender sheath was then placed, and this sheath was used for arterial monitoring during the procedure. Next, the left common femoral vein was punctured under direct ultrasound visualization. A 4/5 French slender sheath was then placed. Attention was then turned to the right groin. Ultrasound evaluation demonstrated a patent right common femoral vein with compressibility. The procedure was planned. Subdermal Local anesthesia was administered at the planned needle entry site. A small skin nick was made. Under direct ultrasound visualization, a 21 gauge micropuncture needle was used to puncture the right common femoral vein. The micropuncture set was exchanged over a J wire for a 5 [place], 11 cm vascular sheath. The wire and a pigtail catheter were directed to the perihepatic inferior vena cava. The wire was removed and exchanged for a tip  deflecting wire. Under fluoroscopic guidance, the tip deflecting wire and pigtail catheter were guided through the right atrium and right ventricle into the main pulmonary artery without difficulty. Pulmonary artery pressures were then measured (69/33, mean 45 mmHg). Pulmonary angiogram was performed which demonstrated large filling defects in the bilateral main pulmonary arteries with limited peripheral perfusion. The left pulmonary artery was then selected with an Amplatz wire. The pigtail catheter and 5 French sheath were removed. Serial dilation was performed and a 12 French, 35 cm dry seal sheath was placed. Through the sheath, the Penumbra CAT 12 thrombectomy catheter was directed into the proximal left pulmonary artery. Mechanical suction thrombectomy was performed using the separator. Multiple passes were made targeting the upper and lower lobe segmental branches. The thrombectomy catheter was removed and flushed. Repeat left-sided pulmonary angiogram was performed with the pigtail catheter which demonstrated significantly improved clot burden and perfusion of the left lung. Attention was then turned toward the right pulmonary artery. A stiff Glidewire was used to select the right inferior lobar pulmonary artery, over which the CAT 12 thrombectomy catheter was advanced. Multiple passes performing section thrombectomy were performed using the separator. The thrombectomy catheter was removed in flush. Repeat right-sided pulmonary angiogram was performed with the pigtail catheters demonstrated significant improved clot burden of perfusion to the right lung. The pigtail catheter was retracted into the main pulmonary artery and repeat pressures were obtained (50/33, mean 38 mmHg). Completion pulmonary angiogram demonstrated some residual bilateral lobar and segmental clot burden, however significantly improved from comparison with overall significantly improved bilateral pulmonary arterial perfusion. At this  point, the patient noted to breathing easier. The catheters and sheaths were removed and manual compression was applied bilaterally without complication. The patient was transferred to the intensive care  unit in stable condition. FINDINGS: Bilateral pulmonary emboli, mostly occlusive with associated pulmonary hypertension. IMPRESSION: 1. Bilateral main pulmonary emboli with associated pulmonary arterial hypertension. 2. Technically successful mechanical thrombectomy of the bilateral pulmonary arteries with reduction in pulmonary arterial hypertension. 3. Technically successful temporary left common femoral artery and common femoral vein vascular access for potential ECMO cannulation. These accesses were removed upon completion of the case as ECMO was not required. Ruthann Cancer, MD [facility] Radiology Electronically Signed   By: Ruthann Cancer MD   On: 04/16/2020 16:44   IR Angiogram Selective Each Additional Vessel  Result Date: 04/16/2020 INDICATION: 50 year old male with history of intermediate high risk (PESI class 4) acute pulmonary embolism in the setting of chronic heart failure and right lower extremity deep vein thrombosis. EXAM: 1. Ultrasound-guided left common femoral artery access. 2. Ultrasound-guided left common femoral vein access. 3. Ultrasound-guided right common femoral vein access. 4. Pulmonary angiogram. 5. Pulmonary manometry. 6. Selective catheterization of the bilateral pulmonary arteries. 7. Mechanical thrombectomy of the bilateral pulmonary arteries. COMPARISON:  CT a chest from 04/15/2020 MEDICATIONS: None. ANESTHESIA/SEDATION: The procedure was performed under monitored anesthesia care by the Department of Anesthesia. Please refer to anesthesia record for medication administration details. FLUOROSCOPY TIME:  Fluoroscopy Time: 40 minutes 24 seconds (757 mGy). COMPLICATIONS: None immediate. TECHNIQUE: Informed written consent was obtained  from the patient after a thorough discussion of the procedural risks, benefits and alternatives. All questions were addressed. Maximal Sterile Barrier Technique was utilized including caps, mask, sterile gowns, sterile gloves, sterile drape, hand hygiene and skin antiseptic. A timeout was performed prior to the initiation of the procedure. The bilateral groins were prepped and draped in standard fashion. Given propensity of clinical decompensation, request was made for vascular access in the setting of possible ECMO cannulation. Therefore, the left common femoral artery and common femoral vein were evaluated with ultrasound. Subdermal Local anesthesia was administered with 1% lidocaine at the planned needle entry sites. Small skin nicks were made with an 11 blade. Under direct ultrasound visualization, the left common femoral artery was punctured with a 21 gauge micropuncture set. A 4/5 French slender sheath was then placed, and this sheath was used for arterial monitoring during the procedure. Next, the left common femoral vein was punctured under direct ultrasound visualization. A 4/5 French slender sheath was then placed. Attention was then turned to the right groin. Ultrasound evaluation demonstrated a patent right common femoral vein with compressibility. The procedure was planned. Subdermal Local anesthesia was administered at the planned needle entry site. A small skin nick was made. Under direct ultrasound visualization, a 21 gauge micropuncture needle was used to puncture the right common femoral vein. The micropuncture set was exchanged over a J wire for a 5 [place], 11 cm vascular sheath. The wire and a pigtail catheter were directed to the perihepatic inferior vena cava. The wire was removed and exchanged for a tip deflecting wire. Under fluoroscopic guidance, the tip deflecting wire and pigtail catheter were guided through the right atrium and right ventricle into the main pulmonary artery without  difficulty. Pulmonary artery pressures were then measured (69/33, mean 45 mmHg). Pulmonary angiogram was performed which demonstrated large filling defects in the bilateral main pulmonary arteries with limited peripheral perfusion. The left pulmonary artery was then selected with an Amplatz wire. The pigtail catheter and 5 French sheath were removed. Serial dilation was performed and a 12 French, 35 cm dry seal sheath was placed. Through the sheath, the  Penumbra CAT 12 thrombectomy catheter was directed into the proximal left pulmonary artery. Mechanical suction thrombectomy was performed using the separator. Multiple passes were made targeting the upper and lower lobe segmental branches. The thrombectomy catheter was removed and flushed. Repeat left-sided pulmonary angiogram was performed with the pigtail catheter which demonstrated significantly improved clot burden and perfusion of the left lung. Attention was then turned toward the right pulmonary artery. A stiff Glidewire was used to select the right inferior lobar pulmonary artery, over which the CAT 12 thrombectomy catheter was advanced. Multiple passes performing section thrombectomy were performed using the separator. The thrombectomy catheter was removed in flush. Repeat right-sided pulmonary angiogram was performed with the pigtail catheters demonstrated significant improved clot burden of perfusion to the right lung. The pigtail catheter was retracted into the main pulmonary artery and repeat pressures were obtained (50/33, mean 38 mmHg). Completion pulmonary angiogram demonstrated some residual bilateral lobar and segmental clot burden, however significantly improved from comparison with overall significantly improved bilateral pulmonary arterial perfusion. At this point, the patient noted to breathing easier. The catheters and sheaths were removed and manual compression was applied bilaterally without complication. The patient was transferred to the  intensive care unit in stable condition. FINDINGS: Bilateral pulmonary emboli, mostly occlusive with associated pulmonary hypertension. IMPRESSION: 1. Bilateral main pulmonary emboli with associated pulmonary arterial hypertension. 2. Technically successful mechanical thrombectomy of the bilateral pulmonary arteries with reduction in pulmonary arterial hypertension. 3. Technically successful temporary left common femoral artery and common femoral vein vascular access for potential ECMO cannulation. These accesses were removed upon completion of the case as ECMO was not required. Ruthann Cancer, MD [facility] Radiology Electronically Signed   By: Ruthann Cancer MD   On: 04/16/2020 16:44   CARDIAC CATHETERIZATION  Result Date: 04/15/2020 Findings: On milrinone 0.125 mcg/kg/min Ao =125/92 (109) LV = 122/25 RA =  15 RV = 63/20 PA = 67/32 (46) PCW = 22 (v=28) Fick cardiac output/index = 5.0/2.0 PVR = 6.0 WU SVR = 1513 Ao sat = 97% PA sat = 66%, 67% PAPi = 2.33 Assessment: 1. Normal coronary arteries 2. 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 Moderate mixed pulmonary venous HTN 4. Elevated filling pressures 5. Marginal CI on milrinone Plan/Discussion: Continue medical therapy. Glori Bickers, MD 4:12 PM   IR THROMBECT PRIM MECH INIT [facility]) MOD SED  Result Date: 04/16/2020 INDICATION: 50 year old male with history of intermediate high risk (PESI class 4) acute pulmonary embolism in the setting of chronic heart failure and right lower extremity deep vein thrombosis. EXAM: 1. Ultrasound-guided left common femoral artery access. 2. Ultrasound-guided left common femoral vein access. 3. Ultrasound-guided right common femoral vein access. 4. Pulmonary angiogram. 5. Pulmonary manometry. 6. Selective catheterization of the bilateral pulmonary arteries. 7. Mechanical thrombectomy of the bilateral pulmonary arteries. COMPARISON:  CT a chest from 04/15/2020 MEDICATIONS: None.  ANESTHESIA/SEDATION: The procedure was performed under monitored anesthesia care by the Department of Anesthesia. Please refer to anesthesia record for medication administration details. FLUOROSCOPY TIME:  Fluoroscopy Time: 40 minutes 24 seconds (757 mGy). COMPLICATIONS: None immediate. TECHNIQUE: Informed written consent was obtained from the patient after a thorough discussion of the procedural risks, benefits and alternatives. All questions were addressed. Maximal Sterile Barrier Technique was utilized including caps, mask, sterile gowns, sterile gloves, sterile drape, hand hygiene and skin antiseptic. A timeout was performed prior to the initiation of the procedure. The bilateral groins were prepped and draped in standard fashion. Given propensity of  clinical decompensation, request was made for vascular access in the setting of possible ECMO cannulation. Therefore, the left common femoral artery and common femoral vein were evaluated with ultrasound. Subdermal Local anesthesia was administered with 1% lidocaine at the planned needle entry sites. Small skin nicks were made with an 11 blade. Under direct ultrasound visualization, the left common femoral artery was punctured with a 21 gauge micropuncture set. A 4/5 French slender sheath was then placed, and this sheath was used for arterial monitoring during the procedure. Next, the left common femoral vein was punctured under direct ultrasound visualization. A 4/5 French slender sheath was then placed. Attention was then turned to the right groin. Ultrasound evaluation demonstrated a patent right common femoral vein with compressibility. The procedure was planned. Subdermal Local anesthesia was administered at the planned needle entry site. A small skin nick was made. Under direct ultrasound visualization, a 21 gauge micropuncture needle was used to puncture the right common femoral vein. The micropuncture set was exchanged over a J wire for a 5 [place], 11 cm  vascular sheath. The wire and a pigtail catheter were directed to the perihepatic inferior vena cava. The wire was removed and exchanged for a tip deflecting wire. Under fluoroscopic guidance, the tip deflecting wire and pigtail catheter were guided through the right atrium and right ventricle into the main pulmonary artery without difficulty. Pulmonary artery pressures were then measured (69/33, mean 45 mmHg). Pulmonary angiogram was performed which demonstrated large filling defects in the bilateral main pulmonary arteries with limited peripheral perfusion. The left pulmonary artery was then selected with an Amplatz wire. The pigtail catheter and 5 French sheath were removed. Serial dilation was performed and a 12 French, 35 cm dry seal sheath was placed. Through the sheath, the Penumbra CAT 12 thrombectomy catheter was directed into the proximal left pulmonary artery. Mechanical suction thrombectomy was performed using the separator. Multiple passes were made targeting the upper and lower lobe segmental branches. The thrombectomy catheter was removed and flushed. Repeat left-sided pulmonary angiogram was performed with the pigtail catheter which demonstrated significantly improved clot burden and perfusion of the left lung. Attention was then turned toward the right pulmonary artery. A stiff Glidewire was used to select the right inferior lobar pulmonary artery, over which the CAT 12 thrombectomy catheter was advanced. Multiple passes performing section thrombectomy were performed using the separator. The thrombectomy catheter was removed in flush. Repeat right-sided pulmonary angiogram was performed with the pigtail catheters demonstrated significant improved clot burden of perfusion to the right lung. The pigtail catheter was retracted into the main pulmonary artery and repeat pressures were obtained (50/33, mean 38 mmHg). Completion pulmonary angiogram demonstrated some residual bilateral lobar and segmental  clot burden, however significantly improved from comparison with overall significantly improved bilateral pulmonary arterial perfusion. At this point, the patient noted to breathing easier. The catheters and sheaths were removed and manual compression was applied bilaterally without complication. The patient was transferred to the intensive care unit in stable condition. FINDINGS: Bilateral pulmonary emboli, mostly occlusive with associated pulmonary hypertension. IMPRESSION: 1. Bilateral main pulmonary emboli with associated pulmonary arterial hypertension. 2. Technically successful mechanical thrombectomy of the bilateral pulmonary arteries with reduction in pulmonary arterial hypertension. 3. Technically successful temporary left common femoral artery and common femoral vein vascular access for potential ECMO cannulation. These accesses were removed upon completion of the case as ECMO was not required. Ruthann Cancer, MD [facility] Electronically Signed   By: Camillia Herter  Suttle MD   On: 04/16/2020 16:44   IR US Guide Vasc Access Left  Result Date: 04/16/2020 INDICATION: 50 year old male with history of intermediate high risk (PESI class 4) acute pulmonary embolism in the setting of chronic heart failure and right lower extremity deep vein thrombosis. EXAM: 1. Ultrasound-guided left common femoral artery access. 2. Ultrasound-guided left common femoral vein access. 3. Ultrasound-guided right common femoral vein access. 4. Pulmonary angiogram. 5. Pulmonary manometry. 6. Selective catheterization of the bilateral pulmonary arteries. 7. Mechanical thrombectomy of the bilateral pulmonary arteries. COMPARISON:  CT a chest from 04/15/2020 MEDICATIONS: None. ANESTHESIA/SEDATION: The procedure was performed under monitored anesthesia care by the Department of Anesthesia. Please refer to anesthesia record for medication administration details. FLUOROSCOPY TIME:   Fluoroscopy Time: 40 minutes 24 seconds (757 mGy). COMPLICATIONS: None immediate. TECHNIQUE: Informed written consent was obtained from the patient after a thorough discussion of the procedural risks, benefits and alternatives. All questions were addressed. Maximal Sterile Barrier Technique was utilized including caps, mask, sterile gowns, sterile gloves, sterile drape, hand hygiene and skin antiseptic. A timeout was performed prior to the initiation of the procedure. The bilateral groins were prepped and draped in standard fashion. Given propensity of clinical decompensation, request was made for vascular access in the setting of possible ECMO cannulation. Therefore, the left common femoral artery and common femoral vein were evaluated with ultrasound. Subdermal Local anesthesia was administered with 1% lidocaine at the planned needle entry sites. Small skin nicks were made with an 11 blade. Under direct ultrasound visualization, the left common femoral artery was punctured with a 21 gauge micropuncture set. A 4/5 French slender sheath was then placed, and this sheath was used for arterial monitoring during the procedure. Next, the left common femoral vein was punctured under direct ultrasound visualization. A 4/5 French slender sheath was then placed. Attention was then turned to the right groin. Ultrasound evaluation demonstrated a patent right common femoral vein with compressibility. The procedure was planned. Subdermal Local anesthesia was administered at the planned needle entry site. A small skin nick was made. Under direct ultrasound visualization, a 21 gauge micropuncture needle was used to puncture the right common femoral vein. The micropuncture set was exchanged over a J wire for a 5 [place], 11 cm vascular sheath. The wire and a pigtail catheter were directed to the perihepatic inferior vena cava. The wire was removed and exchanged for a tip deflecting wire. Under fluoroscopic guidance, the tip  deflecting wire and pigtail catheter were guided through the right atrium and right ventricle into the main pulmonary artery without difficulty. Pulmonary artery pressures were then measured (69/33, mean 45 mmHg). Pulmonary angiogram was performed which demonstrated large filling defects in the bilateral main pulmonary arteries with limited peripheral perfusion. The left pulmonary artery was then selected with an Amplatz wire. The pigtail catheter and 5 French sheath were removed. Serial dilation was performed and a 12 French, 35 cm dry seal sheath was placed. Through the sheath, the Penumbra CAT 12 thrombectomy catheter was directed into the proximal left pulmonary artery. Mechanical suction thrombectomy was performed using the separator. Multiple passes were made targeting the upper and lower lobe segmental branches. The thrombectomy catheter was removed and flushed. Repeat left-sided pulmonary angiogram was performed with the pigtail catheter which demonstrated significantly improved clot burden and perfusion of the left lung. Attention was then turned toward the right pulmonary artery. A stiff Glidewire was used to select the right inferior lobar pulmonary artery, over which the CAT 12 thrombectomy  catheter was advanced. Multiple passes performing section thrombectomy were performed using the separator. The thrombectomy catheter was removed in flush. Repeat right-sided pulmonary angiogram was performed with the pigtail catheters demonstrated significant improved clot burden of perfusion to the right lung. The pigtail catheter was retracted into the main pulmonary artery and repeat pressures were obtained (50/33, mean 38 mmHg). Completion pulmonary angiogram demonstrated some residual bilateral lobar and segmental clot burden, however significantly improved from comparison with overall significantly improved bilateral pulmonary arterial perfusion. At this point, the patient noted to breathing easier. The  catheters and sheaths were removed and manual compression was applied bilaterally without complication. The patient was transferred to the intensive care unit in stable condition. FINDINGS: Bilateral pulmonary emboli, mostly occlusive with associated pulmonary hypertension. IMPRESSION: 1. Bilateral main pulmonary emboli with associated pulmonary arterial hypertension. 2. Technically successful mechanical thrombectomy of the bilateral pulmonary arteries with reduction in pulmonary arterial hypertension. 3. Technically successful temporary left common femoral artery and common femoral vein vascular access for potential ECMO cannulation. These accesses were removed upon completion of the case as ECMO was not required. Ruthann Cancer, MD [facility] Rad[facility] Electronically Signed   By: Ruthann Cancer MD   On: 04/16/2020 16:44   IR US Guide Vasc Access Left  Result Date: 04/16/2020 INDICATION: 50 year old male with history of intermediate high risk (PESI class 4) acute pulmonary embolism in the setting of chronic heart failure and right lower extremity deep vein thrombosis. EXAM: 1. Ultrasound-guided left common femoral artery access. 2. Ultrasound-guided left common femoral vein access. 3. Ultrasound-guided right common femoral vein access. 4. Pulmonary angiogram. 5. Pulmonary manometry. 6. Selective catheterization of the bilateral pulmonary arteries. 7. Mechanical thrombectomy of the bilateral pulmonary arteries. COMPARISON:  CT a chest from 04/15/2020 MEDICATIONS: None. ANESTHESIA/SEDATION: The procedure was performed under monitored anesthesia care by the Department of Anesthesia. Please refer to anesthesia record for medication administration details. FLUOROSCOPY TIME:  Fluoroscopy Time: 40 minutes 24 seconds (757 mGy). COMPLICATIONS: None immediate. TECHNIQUE: Informed written consent was obtained from the patient after a thorough discussion of the procedural  risks, benefits and alternatives. All questions were addressed. Maximal Sterile Barrier Technique was utilized including caps, mask, sterile gowns, sterile gloves, sterile drape, hand hygiene and skin antiseptic. A timeout was performed prior to the initiation of the procedure. The bilateral groins were prepped and draped in standard fashion. Given propensity of clinical decompensation, request was made for vascular access in the setting of possible ECMO cannulation. Therefore, the left common femoral artery and common femoral vein were evaluated with ultrasound. Subdermal Local anesthesia was administered with 1% lidocaine at the planned needle entry sites. Small skin nicks were made with an 11 blade. Under direct ultrasound visualization, the left common femoral artery was punctured with a 21 gauge micropuncture set. A 4/5 French slender sheath was then placed, and this sheath was used for arterial monitoring during the procedure. Next, the left common femoral vein was punctured under direct ultrasound visualization. A 4/5 French slender sheath was then placed. Attention was then turned to the right groin. Ultrasound evaluation demonstrated a patent right common femoral vein with compressibility. The procedure was planned. Subdermal Local anesthesia was administered at the planned needle entry site. A small skin nick was made. Under direct ultrasound visualization, a 21 gauge micropuncture needle was used to puncture the right common femoral vein. The micropuncture set was exchanged over a J wire for a 5 [place], 11 cm vascular sheath. The wire and  a pigtail catheter were directed to the perihepatic inferior vena cava. The wire was removed and exchanged for a tip deflecting wire. Under fluoroscopic guidance, the tip deflecting wire and pigtail catheter were guided through the right atrium and right ventricle into the main pulmonary artery without difficulty. Pulmonary artery pressures were then measured (69/33,  mean 45 mmHg). Pulmonary angiogram was performed which demonstrated large filling defects in the bilateral main pulmonary arteries with limited peripheral perfusion. The left pulmonary artery was then selected with an Amplatz wire. The pigtail catheter and 5 French sheath were removed. Serial dilation was performed and a 12 French, 35 cm dry seal sheath was placed. Through the sheath, the Penumbra CAT 12 thrombectomy catheter was directed into the proximal left pulmonary artery. Mechanical suction thrombectomy was performed using the separator. Multiple passes were made targeting the upper and lower lobe segmental branches. The thrombectomy catheter was removed and flushed. Repeat left-sided pulmonary angiogram was performed with the pigtail catheter which demonstrated significantly improved clot burden and perfusion of the left lung. Attention was then turned toward the right pulmonary artery. A stiff Glidewire was used to select the right inferior lobar pulmonary artery, over which the CAT 12 thrombectomy catheter was advanced. Multiple passes performing section thrombectomy were performed using the separator. The thrombectomy catheter was removed in flush. Repeat right-sided pulmonary angiogram was performed with the pigtail catheters demonstrated significant improved clot burden of perfusion to the right lung. The pigtail catheter was retracted into the main pulmonary artery and repeat pressures were obtained (50/33, mean 38 mmHg). Completion pulmonary angiogram demonstrated some residual bilateral lobar and segmental clot burden, however significantly improved from comparison with overall significantly improved bilateral pulmonary arterial perfusion. At this point, the patient noted to breathing easier. The catheters and sheaths were removed and manual compression was applied bilaterally without complication. The patient was transferred to the intensive care unit in stable condition. FINDINGS: Bilateral  pulmonary emboli, mostly occlusive with associated pulmonary hypertension. IMPRESSION: 1. Bilateral main pulmonary emboli with associated pulmonary arterial hypertension. 2. Technically successful mechanical thrombectomy of the bilateral pulmonary arteries with reduction in pulmonary arterial hypertension. 3. Technically successful temporary left common femoral artery and common femoral vein vascular access for potential ECMO cannulation. These accesses were removed upon completion of the case as ECMO was not required. Ruthann Cancer, MD [facility] Radiology Electronically Signed   By: Ruthann Cancer MD   On: 04/16/2020 16:44   IR US Guide Vasc Access Right  Result Date: 04/16/2020 INDICATION: 50 year old male with history of intermediate high risk (PESI class 4) acute pulmonary embolism in the setting of chronic heart failure and right lower extremity deep vein thrombosis. EXAM: 1. Ultrasound-guided left common femoral artery access. 2. Ultrasound-guided left common femoral vein access. 3. Ultrasound-guided right common femoral vein access. 4. Pulmonary angiogram. 5. Pulmonary manometry. 6. Selective catheterization of the bilateral pulmonary arteries. 7. Mechanical thrombectomy of the bilateral pulmonary arteries. COMPARISON:  CT a chest from 04/15/2020 MEDICATIONS: None. ANESTHESIA/SEDATION: The procedure was performed under monitored anesthesia care by the Department of Anesthesia. Please refer to anesthesia record for medication administration details. FLUOROSCOPY TIME:  Fluoroscopy Time: 40 minutes 24 seconds (757 mGy). COMPLICATIONS: None immediate. TECHNIQUE: Informed written consent was obtained from the patient after a thorough discussion of the procedural risks, benefits and alternatives. All questions were addressed. Maximal Sterile Barrier Technique was utilized including caps, mask, sterile gowns, sterile gloves, sterile drape, hand hygiene and skin  antiseptic. A timeout was  performed prior to the initiation of the procedure. The bilateral groins were prepped and draped in standard fashion. Given propensity of clinical decompensation, request was made for vascular access in the setting of possible ECMO cannulation. Therefore, the left common femoral artery and common femoral vein were evaluated with ultrasound. Subdermal Local anesthesia was administered with 1% lidocaine at the planned needle entry sites. Small skin nicks were made with an 11 blade. Under direct ultrasound visualization, the left common femoral artery was punctured with a 21 gauge micropuncture set. A 4/5 French slender sheath was then placed, and this sheath was used for arterial monitoring during the procedure. Next, the left common femoral vein was punctured under direct ultrasound visualization. A 4/5 French slender sheath was then placed. Attention was then turned to the right groin. Ultrasound evaluation demonstrated a patent right common femoral vein with compressibility. The procedure was planned. Subdermal Local anesthesia was administered at the planned needle entry site. A small skin nick was made. Under direct ultrasound visualization, a 21 gauge micropuncture needle was used to puncture the right common femoral vein. The micropuncture set was exchanged over a J wire for a 5 [place], 11 cm vascular sheath. The wire and a pigtail catheter were directed to the perihepatic inferior vena cava. The wire was removed and exchanged for a tip deflecting wire. Under fluoroscopic guidance, the tip deflecting wire and pigtail catheter were guided through the right atrium and right ventricle into the main pulmonary artery without difficulty. Pulmonary artery pressures were then measured (69/33, mean 45 mmHg). Pulmonary angiogram was performed which demonstrated large filling defects in the bilateral main pulmonary arteries with limited peripheral perfusion. The left pulmonary artery was then  selected with an Amplatz wire. The pigtail catheter and 5 French sheath were removed. Serial dilation was performed and a 12 French, 35 cm dry seal sheath was placed. Through the sheath, the Penumbra CAT 12 thrombectomy catheter was directed into the proximal left pulmonary artery. Mechanical suction thrombectomy was performed using the separator. Multiple passes were made targeting the upper and lower lobe segmental branches. The thrombectomy catheter was removed and flushed. Repeat left-sided pulmonary angiogram was performed with the pigtail catheter which demonstrated significantly improved clot burden and perfusion of the left lung. Attention was then turned toward the right pulmonary artery. A stiff Glidewire was used to select the right inferior lobar pulmonary artery, over which the CAT 12 thrombectomy catheter was advanced. Multiple passes performing section thrombectomy were performed using the separator. The thrombectomy catheter was removed in flush. Repeat right-sided pulmonary angiogram was performed with the pigtail catheters demonstrated significant improved clot burden of perfusion to the right lung. The pigtail catheter was retracted into the main pulmonary artery and repeat pressures were obtained (50/33, mean 38 mmHg). Completion pulmonary angiogram demonstrated some residual bilateral lobar and segmental clot burden, however significantly improved from comparison with overall significantly improved bilateral pulmonary arterial perfusion. At this point, the patient noted to breathing easier. The catheters and sheaths were removed and manual compression was applied bilaterally without complication. The patient was transferred to the intensive care unit in stable condition. FINDINGS: Bilateral pulmonary emboli, mostly occlusive with associated pulmonary hypertension. IMPRESSION: 1. Bilateral main pulmonary emboli with associated pulmonary arterial hypertension. 2. Technically successful mechanical  thrombectomy of the bilateral pulmonary arteries with reduction in pulmonary arterial hypertension. 3. Technically successful temporary left common femoral artery and common femoral vein vascular access for potential ECMO cannulation. These accesses were removed upon completion of the case as  ECMO was not required. Ruthann Cancer, MD [facility] Radiology Electronically Signed   By: Ruthann Cancer MD   On: 04/16/2020 16:44   DG CHEST PORT 1 VIEW  Result Date: 04/16/2020 CLINICAL DATA:  Patient diagnosed with pulmonary embolus 04/15/2020. Cardiac dysfunction. EXAM: PORTABLE CHEST 1 VIEW COMPARISON:  Single-view of the chest and CT chest 04/15/2020. FINDINGS: The patient's right PICC projects just within the right atrium. Mild left basilar atelectasis is seen. Lungs otherwise clear. Cardiomegaly. No pneumothorax or pleural fluid. IMPRESSION: Left basilar atelectasis. Cardiomegaly without edema. Electronically Signed   By: Inge Rise M.D.   On: 04/16/2020 18:53   DG CHEST PORT 1 VIEW  Result Date: 04/15/2020 CLINICAL DATA:  Chest pain at rest. EXAM: PORTABLE CHEST 1 VIEW COMPARISON:  Radiograph 04/12/2020 FINDINGS: Right upper extremity PICC tip at the atrial caval junction. Lower lung volumes from prior exam leading to bronchovascular crowding. Cardiomegaly. No convincing pulmonary edema or pleural effusion. No pneumothorax. Elevation of right hemidiaphragm. IMPRESSION: Cardiomegaly. Lower lung volumes from prior exam leading to bronchovascular crowding. Electronically Signed   By: Keith Rake M.D.   On: 04/15/2020 18:35   MR CARDIAC MORPHOLOGY W WO CONTRAST  Result Date: 04/15/2020 CLINICAL DATA:  Clinical question of cardiomyopathy and left ventricular mass 50 year old Male Study assumes HCT of 57 EXAM: CARDIAC MRI TECHNIQUE: The patient was scanned on a 1.5 Tesla GE magnet. A dedicated cardiac coil was used. Functional imaging was done using  F[employer]a sequences. 2,3, and 4 chamber views were done to assess for RWMA's. Modified Simpson's rule using a short axis stack was used to calculate an ejection fraction on a dedicated work [employer] officer, nature. The patient received 10 cc of Gadavist. After 10 minutes inversion recovery sequences were used to assess for infiltration and scar tissue. CONTRAST:  10 cc  of Gadavist FINDINGS: 1. Severe left ventricular dilation, with LVEDD 71 mm, and LVEDVi 130 mL/m2. Normal left ventricular thickness, with intraventricular septal thickness of 7 mm, posterior wall thickness of 5 mm. Severe left ventricular systolic dysfunction (LVEF =12%). There is severe global hypokinesis with apical dyskinesis. Left ventricular parametric mapping notable for diffuse increase in ECV signal in the apex (30-40%). Normal T2 signal. There is no late gadolinium enhancement in the left ventricular myocardium. There is a density 26 mm X 23 mm X 10.5 mm in the left ventricular apex that does not perfuse with contrast consistent with LV thrombus. 2. Severe right ventricular dilation with RVEDVI 104 mL/m2. Normal right ventricular thickness. Severe right ventricular systolic dysfunction (RVEF =21 %). Severe global ventricular hypokinesis with apical akinesis. 3.  Normal size of the left and moderate right atrial dilation: 4. Normal size of the aortic root, ascending aorta. Pulmonary artery is mildly dilated at 40 mm. 5. Tricuspid regurgitation and mitral regurgitation is visualized qualitatively. 6.  Normal pericardium.  No pericardial effusion. 7. Grossly, no extracardiac findings. Recommended dedicated study if concerned for non-cardiac pathology. IMPRESSION: 1.  Severe left ventricular dilation. 2. Severe left ventricular systolic dysfunction (LVEF =12%). There is severe global hypokinesis with apical dyskinesis. 3.  Diffuse increase in ECV signal in the apex (30-40%). 4.  There is likely an LV thrombus. 5.  Severe right ventricular  dilation. 6. Severe right ventricular systolic dysfunction (RVEF =21 %). Severe global ventricular hypokinesis with apical akinesis. 7.  Moderate right atrial dilation 8.  Pulmonary artery is mildly dilated at 40 mm 9. Tricuspid regurgitation and mitral regurgitation is  visualized qualitatively. 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    COAGS: Recent Labs    04/12/20 1552 04/16/20 0515  INR 1.9* 1.6*  APTT 35  --     BMP: Recent Labs    04/15/20 0659 04/15/20 1527 04/16/20 0446 04/16/20 1320 04/16/20 1549 04/17/20 0505 04/18/20 0555  NA 139   < > 136 137 138 134* 133*  K 3.8   < > 3.9 3.7 3.6 3.3* 3.6  CL 90*  --  89*  --   --  90* 92*  CO2 34*  --  35*  --   --  35* 34*  GLUCOSE 116*  --  134*  --   --  177* 116*  BUN 24*  --  27*  --   --  26* 18  CALCIUM 8.8*  --  9.0  --   --  8.6* 8.5*  CREATININE 1.40*  --  1.40*  --   --  1.22 1.04  GFRNONAA >60  --  >60  --   --  >60 >60   < > = values in this interval not displayed.    LIVER FUNCTION TESTS: Recent Labs    04/15/20 0659 04/16/20 0445 04/16/20 0446 04/17/20 0505 04/18/20 0555  BILITOT 5.0* 5.0*  --  4.1* 3.6*  AST 56* 52*  --  38 34  ALT 111* 92*  --  62* 51*  ALKPHOS 63 65  --  55 52  PROT 6.8 7.0  --  6.2* 5.9*  ALBUMIN 2.7*  2.6* 2.6* 2.6* 2.3*  2.4* 2.2*    Assessment and Plan:  Right lower extremity DVT and acute bilateral pulmonary embolism.  Status post bilateral pulmonary artery mechanical thrombectomy by Dr. Serafina Royals.  Continue anticoagulation -   transition to PO.  Ok to remove dressings tomorrow.   Electronically Signed: Murrell Redden, PA-C 04/18/2020, 11:27 AM    I spent a total of 15 Minutes at the the patient's bedside AND on the patient's hospital floor or unit, greater than 50% of which was counseling/coordinating care for follow-up PE mechanical thrombectomy.

## 2020-04-18 NOTE — Plan of Care (Signed)

## 2020-04-18 NOTE — Progress Notes (Addendum)
[place] for Heparin Indication: DVT/PE and apical thrombus  Allergies  Allergen Reactions  . Pseudoephedrine     REACTION: jittery    Patient Measurements: Height: 6\' 1"  (185.4 cm) Weight: 120.3 kg (265 lb 3.4 oz) IBW/kg (Calculated) : 79.9 Heparin Dosing Weight: 110 kg  Vital Signs: Temp: 97.7 F (36.5 C) (03/20 2000) Temp Source: Oral (03/20 2000) BP: 121/86 (03/20 2300) Pulse Rate: 115 (03/20 2300)  Labs: Recent Labs    04/16/20 0406 04/16/20 0413 04/16/20 0446 04/16/20 0515 04/16/20 1320 04/16/20 1727 04/16/20 2348 04/17/20 0505 04/18/20 0555 04/18/20 1304 04/18/20 2305  HGB  --    < >  --   --    < >  --  14.5 14.2 13.4  --   --   HCT  --    < >  --   --    < >  --  46.1 44.4 42.4  --   --   PLT  --    < >  --   --   --   --  167 163 179  --   --   LABPROT  --   --   --  18.5*  --   --   --   --   --   --   --   INR  --   --   --  1.6*  --   --   --   --   --   --   --   HEPARINUNFRC  --    < >  --  0.24*   < >  --  0.61 0.65 0.96* 0.75* 0.57  CREATININE  --   --  1.40*  --   --   --   --  1.22 1.04  --   --   TROPONINIHS 60*  --   --   --   --  98*  --   --   --   --   --    < > = values in this interval not displayed.    Estimated Creatinine Clearance: 114.2 mL/min (by C-G formula based on SCr of 1.04 mg/dL).  Assessment: 50 y.o. male with RLE DVT and apical thrombus continuing on IV heparin. 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  CTA showed submassive bilateral PE with RHS, now s/p IR for thrombectomy 3/18. Heparin drip was not turned off at all during procedure. Patient did NOT receive thrombolytics.  RN called and pt with some red-tinged urine. No frank blood currently. Checked heparin level which was therapeutic (0.57) - pt continues on gtt at 2200 units/hr. Heparin is running in central line  and lab drawn from peripheral stick.   Plan to continue heparin over the weekend and if stable convert to oral [facility] Monday.  Goal of Therapy:  Heparin level 0.3-0.7 units/ml Monitor platelets by anticoagulation protocol: Yes   Plan:  Continue heparin at 2200 units/hr  Daily heparin level and CBC F/u for worsening of hematuria   Sherlon Handing, PharmD, BCPS Please see amion for complete clinical pharmacist phone list 04/18/2020 11:58 PM   ADDENDUM [phone]): Heparin level now up to slightly supratherapeutic (0.74) on gtt at 2200 units/hr. Level drawn peripherally and heparin running centrally. Pt continues with red/Rappa tinged urine but not worsening. Hgb down a bit to 12.9.  Plan: Decrease heparin to  2050 units/hr Will f/u 6 hr heparin level F/u for worsening of hematuria  Sherlon Handing, PharmD, BCPS Please see amion for complete clinical pharmacist phone list 04/19/2020 4:14 AM

## 2020-04-19 ENCOUNTER — Encounter (HOSPITAL_COMMUNITY): Payer: Self-pay | Admitting: Interventional Radiology

## 2020-04-19 ENCOUNTER — Inpatient Hospital Stay (HOSPITAL_COMMUNITY): Payer: 59

## 2020-04-19 DIAGNOSIS — I82411 Acute embolism and thrombosis of right femoral vein: Secondary | ICD-10-CM | POA: Diagnosis not present

## 2020-04-19 DIAGNOSIS — I5082 Biventricular heart failure: Secondary | ICD-10-CM | POA: Diagnosis not present

## 2020-04-19 DIAGNOSIS — R9430 Abnormal result of cardiovascular function study, unspecified: Secondary | ICD-10-CM | POA: Diagnosis not present

## 2020-04-19 DIAGNOSIS — R112 Nausea with vomiting, unspecified: Secondary | ICD-10-CM | POA: Diagnosis not present

## 2020-04-19 DIAGNOSIS — I2602 Saddle embolus of pulmonary artery with acute cor pulmonale: Secondary | ICD-10-CM | POA: Diagnosis not present

## 2020-04-19 DIAGNOSIS — I5021 Acute systolic (congestive) heart failure: Secondary | ICD-10-CM | POA: Diagnosis not present

## 2020-04-19 DIAGNOSIS — K810 Acute cholecystitis: Secondary | ICD-10-CM | POA: Diagnosis not present

## 2020-04-19 DIAGNOSIS — R570 Cardiogenic shock: Secondary | ICD-10-CM

## 2020-04-19 LAB — LIPASE, BLOOD: Lipase: 185 U/L — ABNORMAL HIGH (ref 11–51)

## 2020-04-19 LAB — CBC
HCT: 39.6 % (ref 39.0–52.0)
HCT: 41.5 % (ref 39.0–52.0)
Hemoglobin: 12.9 g/dL — ABNORMAL LOW (ref 13.0–17.0)
Hemoglobin: 13.4 g/dL (ref 13.0–17.0)
MCH: 25.4 pg — ABNORMAL LOW (ref 26.0–34.0)
MCH: 25.3 pg — ABNORMAL LOW (ref 26.0–34.0)
MCHC: 32.6 g/dL (ref 30.0–36.0)
MCHC: 32.3 g/dL (ref 30.0–36.0)
MCV: 78 fL — ABNORMAL LOW (ref 80.0–100.0)
MCV: 78.4 fL — ABNORMAL LOW (ref 80.0–100.0)
Platelets: 201 10*3/uL (ref 150–400)
Platelets: 249 10*3/uL (ref 150–400)
RBC: 5.08 MIL/uL (ref 4.22–5.81)
RBC: 5.29 MIL/uL (ref 4.22–5.81)
RDW: 15.9 % — ABNORMAL HIGH (ref 11.5–15.5)
RDW: 16.1 % — ABNORMAL HIGH (ref 11.5–15.5)
WBC: 7.2 10*3/uL (ref 4.0–10.5)
WBC: 7 10*3/uL (ref 4.0–10.5)
nRBC: 0 % (ref 0.0–0.2)
nRBC: 0 % (ref 0.0–0.2)

## 2020-04-19 LAB — POCT I-STAT 7, (LYTES, BLD GAS, ICA,H+H)
Acid-Base Excess: 12 mmol/L — ABNORMAL HIGH (ref 0.0–2.0)
Bicarbonate: 37.9 mmol/L — ABNORMAL HIGH (ref 20.0–28.0)
Calcium, Ion: 1.12 mmol/L — ABNORMAL LOW (ref 1.15–1.40)
HCT: 51 % (ref 39.0–52.0)
Hemoglobin: 17.3 g/dL — ABNORMAL HIGH (ref 13.0–17.0)
O2 Saturation: 76 %
Potassium: 3.8 mmol/L (ref 3.5–5.1)
Sodium: 137 mmol/L (ref 135–145)
TCO2: 39 mmol/L — ABNORMAL HIGH (ref 22–32)
pCO2 arterial: 49.4 mmHg — ABNORMAL HIGH (ref 32.0–48.0)
pH, Arterial: 7.493 — ABNORMAL HIGH (ref 7.350–7.450)
pO2, Arterial: 38 mmHg — CL (ref 83.0–108.0)

## 2020-04-19 LAB — COOXEMETRY PANEL
Carboxyhemoglobin: 1.2 % (ref 0.5–1.5)
Methemoglobin: 0.9 % (ref 0.0–1.5)
O2 Saturation: 70.6 %
Total hemoglobin: 12.8 g/dL (ref 12.0–16.0)

## 2020-04-19 LAB — COMPREHENSIVE METABOLIC PANEL
ALT: 47 U/L — ABNORMAL HIGH (ref 0–44)
AST: 35 U/L (ref 15–41)
Albumin: 2.1 g/dL — ABNORMAL LOW (ref 3.5–5.0)
Alkaline Phosphatase: 54 U/L (ref 38–126)
Anion gap: 7 (ref 5–15)
BUN: 14 mg/dL (ref 6–20)
CO2: 29 mmol/L (ref 22–32)
Calcium: 8.5 mg/dL — ABNORMAL LOW (ref 8.9–10.3)
Chloride: 97 mmol/L — ABNORMAL LOW (ref 98–111)
Creatinine, Ser: 1.09 mg/dL (ref 0.61–1.24)
GFR, Estimated: >60 mL/min (ref >60)
Glucose, Bld: 105 mg/dL — ABNORMAL HIGH (ref 70–99)
Potassium: 3.9 mmol/L (ref 3.5–5.1)
Sodium: 133 mmol/L — ABNORMAL LOW (ref 135–145)
Total Bilirubin: 3.4 mg/dL — ABNORMAL HIGH (ref 0.3–1.2)
Total Protein: 5.9 g/dL — ABNORMAL LOW (ref 6.5–8.1)

## 2020-04-19 LAB — ECHOCARDIOGRAM LIMITED
Area-P 1/2: 5.34 cm2
Height: 73 in
S' Lateral: 5.5 cm
Weight: 4243.41 oz

## 2020-04-19 LAB — HEPARIN LEVEL (UNFRACTIONATED)
Heparin Unfractionated: 0.74 IU/mL — ABNORMAL HIGH (ref 0.30–0.70)
Heparin Unfractionated: 0.61 IU/mL (ref 0.30–0.70)
Heparin Unfractionated: 0.55 IU/mL (ref 0.30–0.70)

## 2020-04-19 LAB — PROTHROMBIN GENE MUTATION

## 2020-04-19 LAB — FACTOR 5 LEIDEN

## 2020-04-19 MED ORDER — PANTOPRAZOLE SODIUM 40 MG IV SOLR
40.0000 mg | INTRAVENOUS | Status: DC
  Administered 2020-04-19 – 2020-04-24 (×5): 40 mg via INTRAVENOUS
  Filled 2020-04-19 (×6): qty 40

## 2020-04-19 MED ORDER — HEPARIN (PORCINE) 25000 UT/250ML-% IV SOLN
1850.0000 [IU]/h | INTRAVENOUS | Status: DC
  Administered 2020-04-19: 1950 [IU]/h via INTRAVENOUS
  Administered 2020-04-20 – 2020-04-21 (×3): 1850 [IU]/h via INTRAVENOUS
  Filled 2020-04-19 (×4): qty 250

## 2020-04-19 MED ORDER — PERFLUTREN LIPID MICROSPHERE
1.0000 mL | INTRAVENOUS | Status: AC | PRN
  Administered 2020-04-19: 1 mL via INTRAVENOUS
  Filled 2020-04-19: qty 10

## 2020-04-19 MED ORDER — SACUBITRIL-VALSARTAN 24-26 MG PO TABS
1.0000 | ORAL_TABLET | Freq: Two times a day (BID) | ORAL | Status: DC
  Administered 2020-04-19 – 2020-04-24 (×11): 1 via ORAL
  Filled 2020-04-19 (×12): qty 1

## 2020-04-19 MED ORDER — POTASSIUM CHLORIDE CRYS ER 20 MEQ PO TBCR
20.0000 meq | EXTENDED_RELEASE_TABLET | Freq: Once | ORAL | Status: AC
  Administered 2020-04-19: 20 meq via ORAL
  Filled 2020-04-19: qty 1

## 2020-04-19 MED ORDER — PROSOURCE TF PO LIQD
45.0000 mL | Freq: Three times a day (TID) | ORAL | Status: DC
  Administered 2020-04-19 – 2020-04-24 (×7): 45 mL
  Filled 2020-04-19 (×8): qty 45

## 2020-04-19 NOTE — Progress Notes (Signed)
Transferred pt to 2C14 per bed.Alert and oriented.On 2 L [place].Sats >95%.Report given to [place] RN

## 2020-04-19 NOTE — Progress Notes (Signed)
NAME:  Kevin Baxter, MRN:  [id number], DOB:  11/08/1970, LOS: 7 ADMISSION DATE:  04/11/2020, CONSULTATION DATE:  3/18 REFERRING MD:  Hospitalist, CHIEF COMPLAINT:  Shortness of breath   History of Present Illness:  Kevin Baxter is a 50 y.o.M who presented to [facility] on 3/13 with reports of, GERD, bloating, N/V since January 2022.  In Jan 2022 he was admitted with abdominal pain, N/V.  CT imaging at that time was worrisome for possible pancreatitis with CT imaging notable for expansion of the pancreatic tail, adjacent fat stranding and edema (lipase in 70-80's), no gallstones. Of note he has been using Fever Few, Sea Moss and Stanley as supplements.  He was treated conservatively and discharged home.  He has been dealing with depression since at least January. No hx of smoking, ETOH or illicit drugs.  He followed up with primary care and was treated for GERD.    He was seen again in the ER on 3/3 for upper chest pain and difficulty swallowing. Prior EGD in 05/2019 showed esophagitis (no reports of difficulty with anesthesia).  He was treated for GERD with improvement in acute symptoms. Of note CXR at that time demonstrated cardiomegaly and mild vascular congestion.    The patient underwent EGD on 3/10 and had difficulties waking from anesthesia and loss of vision and was sent to the ER for evaluation.  CT head imaging negative at that time.  CT ABD/Pelvis 3/10 showed near complete resolution of prior pancreatic findings.  On 3/13 he returned to the ER with N/V.  They associated it with post EGD.  He called the answering service and referred to the ER.  Initial evaluation notable for tachycardia, mildly elevated AST/ALT/lipase.  US of the abdomen was assessed which demonstrated GB sludge / wall thickening. NM HIDA scan was concerning for possible acute cholecystitis. GI / CCS consulted. He was diagnosed with biventricular heart failure, and acute right femoral DVT.  The heart failure team was consulted,  cardiac MRI revealed LV thrombus and severe RV dilation.  He was started on heparin and plan was for right heart cath.  He began having chest pain on 3/17 and CTA chest with acute central pulmonary emboli.  He remained short of breath and tachycardic with evidence of right heart strain, RV/LV ratio 1.2. R/LHC with normal coronaries, RV strain, PA 67/32, PCW 22.  Pt on milrinone for cath.  He had mild hemoptysis, likely in setting of PE.  IR consulted for possible thrombectomy.     Pertinent  Medical History  Gout - on allopurinol HTN - on losartan  Tadalafil  Anxiety / Depression  GERD, colon polyps  Significant Hospital Events: Including procedures, antibiotic start and stop dates in addition to other pertinent events   3/14 admit to hospitalists, DVT of the right femoral vein, started on heparin, started on Zosyn, echocardiogram large 3.6 x 2.3 cm apical thrombus severely dilated LV. 3/17 right heart cath with normal coronary arteries, severe and ICM EF 15%, moderate pulmonary hypertension 3/18 Central submassive PE, PCCM consult. Underwent IR thrombectomy. ECMO team on standby but did not need.  Required initiation of inhaled NO during procedure due to hypoxia.  Given IV amiodarone and IV Mg due to polymorphic VT 3/21 repeat limited echocardiogram, EF slightly improved, RV with movement, apical thrombus still present, off INO, remains on low-dose milrinone  Interim History / Subjective:   Remains critically ill in the intensive care unit, nausea improved, abdominal pains intermittent  Objective  Blood pressure (!) 142/99, pulse (!) 110, temperature 97.7 F (36.5 C), temperature source Oral, resp. rate (!) 32, height 6\' 1"  (1.854 m), weight 120.3 kg, SpO2 94 %. CVP:  [6 mmHg-7 mmHg] 6 mmHg      Intake/Output Summary (Last 24 hours) at 04/19/2020 0709 Last data filed at 04/19/2020 0500 Gross per 24 hour  Intake 1167.6 ml  Output 550 ml  Net 617.6 ml   Filed Weights   04/16/20 0534  04/17/20 0421 04/18/20 0317  Weight: 124.9 kg 121.1 kg 120.3 kg    Gen:      Middle-aged male resting in bed HEENT: Tracking appropriately Neck:    No mass Lungs:    Clear to auscultation bilaterally CV:      diminished heart tones, regular rate rhythm on telemetry Abd:      Bowel sounds, present, pain with palpation in the midepigastrium, mildly distended Ext: No significant edema Skin:      Warm dry Neuro: Alert oriented following commands Psych: Normal mood and affect  Labs/imaging personally reviewed    Liver enzymes improving Serum creatinines 1.09 Sodium 133 Hemoglobin 12.9  Resolved Hospital Problem list     Assessment & Plan:   Acute Central Submassive Pulmonary Embolism, Femoral DVT Hemoptysis  S/p mechanical thrombectomy by IR Plan: Remains critically ill in the ICU for close monitoring Continue heparin infusion Successfully weaned off of inhaled nitric Hyper coag panel pending.  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 Continue milrinone Continue digoxin Amiodarone Heart failure regimen per HF team. Continue heparin  AKI Urine output and kidney function  Acute Cholecystitis, Pancreatitis, Elevated LFTs HIDA positive for acute cholecystitis, no stones but did have sludge > symptoms may be related to CHF / congestion Plan: Not a candidate for surgery at this time Continue antibiotics Monitor liver function  Nausea EGD on 3/11 was unremarkable Plan: Continue Zofran plus Compazine as needed No issues at this time continue to observe  Gout Continue allopurinol  Depression / Anxiety Insomnia Wellbutrin plus Remeron   Best practice (evaluated daily)  Diet:  Oral Pain/Anxiety/Delirium protocol (if indicated): No VAP protocol (if indicated): Not indicated DVT prophylaxis: Systemic AC GI prophylaxis: N/A and PPI Glucose control:  SSI No Central venous access:   N/A Arterial line:  N/A Foley:  N/A Mobility:  bed rest  PT consulted: N/A Last date of multidisciplinary goals of care discussion [per primary]  Discussed care with patient and patient's wife at bedside 04/19/2020.  Code Status:  full code Disposition: ICU  Critical care time:     This patient is critically ill with multiple organ system failure; which, requires frequent high complexity decision making, assessment, support, evaluation, and titration of therapies. This was completed through the application of advanced monitoring technologies and extensive interpretation of multiple databases. During this encounter critical care time was devoted to patient care services described in this note for 35 minutes.  Garner Nash, DO [facility] 04/19/2020 7:09 AM

## 2020-04-19 NOTE — Progress Notes (Signed)
[place] for Heparin Indication: DVT/PE and apical thrombus  Allergies  Allergen Reactions  . Pseudoephedrine     REACTION: jittery    Patient Measurements: Height: 6\' 1"  (185.4 cm) Weight: 125.7 kg (277 lb 1.9 oz) IBW/kg (Calculated) : 79.9 Heparin Dosing Weight: 110 kg  Vital Signs: Temp: 97.6 F (36.4 C) (03/21 2043) Temp Source: Oral (03/21 2043) BP: 98/69 (03/21 2200) Pulse Rate: 107 (03/21 2200)  Labs: Recent Labs    04/17/20 0505 04/18/20 0555 04/18/20 1304 04/19/20 0211 04/19/20 1117 04/19/20 2047 04/19/20 2141  HGB 14.2 13.4  --  12.9*  --  13.4  --   HCT 44.4 42.4  --  39.6  --  41.5  --   PLT 163 179  --  201  --  249  --   HEPARINUNFRC 0.65 0.96*   < > 0.74* 0.61  --  0.55  CREATININE 1.22 1.04  --  1.09  --   --   --    < > = values in this interval not displayed.    Estimated Creatinine Clearance: 111.4 mL/min (by C-G formula based on SCr of 1.09 mg/dL).  Assessment: 50 y.o. male with RLE DVT and apical thrombus continuing on IV heparin. 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  CTA showed submassive bilateral PE with RHS, now s/p IR for thrombectomy 3/18. Heparin drip was not turned off at all during procedure. Patient did NOT receive thrombolytics.  Heparin level came back therapeutic at 0.61, on heparin infusion at 2050 units/hr. CBC stable. No infusion issues - still with some red-tinged urine. Plan to work up with UA and cytology.   If remains stable, will transition to oral AC in near future.   PM f/u >   Heparin level at goal this evening.  No overt bleeding or complications noted.  Goal of Therapy:  Heparin level 0.3-0.7 units/ml Monitor platelets by anticoagulation protocol: Yes   Plan:  Continue IV heparin at current rate. Daily heparin level and CBC.  [place] Crane, Roylene Reason, BCCP Clinical Pharmacist  04/19/2020 10:26 PM   [facility] pharmacy phone numbers are listed on [email or web address]

## 2020-04-19 NOTE — Progress Notes (Signed)
Nutrition Follow-up  DOCUMENTATION CODES:   Severe malnutrition in context of acute illness/injury  INTERVENTION:   Recommend small, more frequent meals. Plan to add high protein, lower fat snacks between meals  Continue Boost Breeze po TID, each supplement provides 250 kcal and 9 grams of protein  Add 30 ml ProSource Plus TID, each supplement provides 100 kcals and 15 grams protein.    NUTRITION DIAGNOSIS:   Severe Malnutrition related to acute illness as evidenced by percent weight loss,mild fat depletion,moderate muscle depletion,energy intake < 75% for > 7 days.  Being addressed via supplements, snacks  GOAL:   Patient will meet greater than or equal to 90% of their needs  Being progressed  MONITOR:   Diet advancement,Labs,Weight trends,I & O's  REASON FOR ASSESSMENT:   Malnutrition Screening Tool    ASSESSMENT:   50 y.o. male with medical history significant for hypertension, gout, anxiety/depression, morbid obesity, GERD, history of polyposis of his stomach and colonic polyps, reported esophageal dysphagia who has had abdominal pain nausea and vomiting going on for several weeks  Recent admission from 1/27-1/30 for similar complaint and treated for acute pancreatitis.Since discharge she has been having intractable nausea vomiting and spitting blood and not tolerating diet.  3/14 Admitted, HIDA with acute acalculous cholecystitis 3/17 Cath with normal coronary arteries, severe and ICM EF 15%, moderate pulm HTN 3/18 new onset chest pain, central submassive PE 3/20 Abd xray with normal bowel gas pattern  No recorded po intake since 3/17  Significant nausea continues at times; pt reports no nausea today. Ate a little bit of breakfast. Pt reports   Abdominal xray with normal bowel gas pattern. +BM yesterday after long period of constipation; hopefull this may help some with nausea.   Pt reports he has been eating poorly since at least January due to nausea and  vomiting. Pt endorses a 50 pound wt loss since Jan (16% wt loss)  Per weight encounters, weight down to 120.3 kg; weight of 143 kg in Jan indicating 16% wt loss  Given GI symptoms in setting of cholecystitis, discussed following a diet that limits higher fat foods/ fried foods. Pt would also likely benefit from smaller, more frequent meals. Discussed addition of snacks between meals and obtained preferences  Labs: sodium 133 (L) Meds: thiamine, compazine prn, miralax daily, bentyl prn  NUTRITION - FOCUSED PHYSICAL EXAM:  Flowsheet Row Most Recent Value  Orbital Region Mild depletion  Upper Arm Region Mild depletion  Thoracic and Lumbar Region No depletion  Buccal Region Mild depletion  Temple Region Mild depletion  Clavicle Bone Region Moderate depletion  Clavicle and Acromion Bone Region Moderate depletion  Scapular Bone Region Moderate depletion  Dorsal Hand No depletion  Patellar Region Mild depletion  Anterior Thigh Region Mild depletion  Posterior Calf Region Mild depletion  Edema (RD Assessment) Mild       Diet Order:   Diet Order            Diet Heart Room service appropriate? Yes; Fluid consistency: Thin  Diet effective now                 EDUCATION NEEDS:   No education needs have been identified at this time  Skin:  Skin Assessment: Reviewed RN Assessment  Last BM:  3/20  Height:   Ht Readings from Last 1 Encounters:  04/11/20 6\' 1"  (1.854 m)    Weight:   Wt Readings from Last 1 Encounters:  04/18/20 120.3 kg    BMI:  Body mass index is 34.99 kg/m.  Estimated Nutritional Needs:   Kcal:  2200-2400  Protein:  100-115g  Fluid:  2.2L/day   Kerman Passey MS, RDN, LDN, CNSC Registered Dietitian III Clinical Nutrition RD Pager and On-Call Pager Number Located in [place]

## 2020-04-19 NOTE — Progress Notes (Addendum)
Advanced Heart Failure Team Rounding Note   Primary Physician: Kathlene November Primary Cardiologist:  Fransico Him  Reason for Consultation: Biventricular CHF  HPI:    Events - 3/17 cath with normal cor. RV strain on RHC.  - Post cath developed CP and hemoptysis. CT with submassive bilateral PE and RUL and RML infarcts.  - 3/18 underwent Penumbra clot extraction   CVP 8 and coox 70% on milrinone 0.151mcg.  He slept well, reports breathing has improved.  Walked around unit yesterday without issue.  Denies chest pain, still some RUQ pain but improved.  BP and HR improving.  His nausea has improved significantly after having large BM yesterday.  Hemoptysis slowing down, hematuria seems to be intermittent but improving per nursing staff.     Objective:    Vital Signs:   Temp:  [97.7 F (36.5 C)-98.2 F (36.8 C)] 98.2 F (36.8 C) (03/21 0750) Pulse Rate:  [110-125] 110 (03/21 0800) Resp:  [19-41] 21 (03/21 0800) BP: (111-142)/(71-99) 136/97 (03/21 0800) SpO2:  [91 %-98 %] 97 % (03/21 0800) Last BM Date: 04/18/20  Weight change: Filed Weights   04/16/20 0534 04/17/20 0421 04/18/20 0317  Weight: 124.9 kg 121.1 kg 120.3 kg    Intake/Output:   Intake/Output Summary (Last 24 hours) at 04/19/2020 0998 Last data filed at 04/19/2020 0800 Gross per 24 hour  Intake 1228.23 ml  Output 550 ml  Net 678.23 ml      Physical Exam    General: appears comfortable, eating breakfast, not in distress Cardiac: JVD 8, normal rate and rhythm, clear s1 and s2, no murmurs, rubs or gallops, no LE edema Pulmonary: faint rales bilaterally, not in distress Abdominal: non distended abdomen, soft and nontender, no RUQ tenderness with palpation Psych: Alert, conversant, in good spirits    Telemetry   Sinus tach 110 Personally reviewed   Labs   Basic Metabolic Panel: Recent Labs  Lab 04/14/20 0712 04/14/20 1850 04/15/20 0659 04/15/20 1527 04/16/20 0446 04/16/20 1320 04/16/20 1549  04/17/20 0505 04/18/20 0555 04/19/20 0211  NA 139   < > 139   < > 136 137 138 134* 133* 133*  K 4.3   < > 3.8   < > 3.9 3.7 3.6 3.3* 3.6 3.9  CL 101   < > 90*  --  89*  --   --  90* 92* 97*  CO2 27   < > 34*  --  35*  --   --  35* 34* 29  GLUCOSE 114*   < > 116*  --  134*  --   --  177* 116* 105*  BUN 24*   < > 24*  --  27*  --   --  26* 18 14  CREATININE 1.31*   < > 1.40*  --  1.40*  --   --  1.22 1.04 1.09  CALCIUM 9.1   < > 8.8*  --  9.0  --   --  8.6* 8.5* 8.5*  MG  --   --   --   --   --   --   --   --  2.0  --   PHOS 4.9*  --  5.2*  --  4.5  --   --  3.4 2.9  --    < > = values in this interval not displayed.    Liver Function Tests: Recent Labs  Lab 04/15/20 3382 04/16/20 0445 04/16/20 5053 04/17/20 0505 04/18/20 0555 04/19/20 0211  AST 56* 52*  --  38 34 35  ALT 111* 92*  --  62* 51* 47*  ALKPHOS 63 65  --  55 52 54  BILITOT 5.0* 5.0*  --  4.1* 3.6* 3.4*  PROT 6.8 7.0  --  6.2* 5.9* 5.9*  ALBUMIN 2.7*  2.6* 2.6* 2.6* 2.3*  2.4* 2.2* 2.1*   Recent Labs  Lab 04/15/20 0659 04/16/20 0445 04/17/20 0505 04/18/20 0555 04/19/20 0211  LIPASE 165* 152* 170* 156* 185*   No results for input(s): AMMONIA in the last 168 hours.  CBC: Recent Labs  Lab 04/16/20 0445 04/16/20 1320 04/16/20 1549 04/16/20 2348 04/17/20 0505 04/18/20 0555 04/19/20 0211  WBC 8.1  --   --  10.1 9.4 7.8 7.2  HGB 16.7   < > 16.7 14.5 14.2 13.4 12.9*  HCT 51.7   < > 49.0 46.1 44.4 42.4 39.6  MCV 78.3*  --   --  80.5 79.1* 80.0 78.0*  PLT 189  --   --  167 163 179 201   < > = values in this interval not displayed.    Cardiac Enzymes: No results for input(s): CKTOTAL, CKMB, CKMBINDEX, TROPONINI in the last 168 hours.  BNP: BNP (last 3 results) Recent Labs    04/12/20 1216 04/16/20 0406  BNP 1,153.9* 885.4*    ProBNP (last 3 results) No results for input(s): PROBNP in the last 8760 hours.   CBG: No results for input(s): GLUCAP in the last 168 hours.  Coagulation  Studies: No results for input(s): LABPROT, INR in the last 72 hours.   Imaging   DG Abd Portable 1V  Result Date: 04/18/2020 CLINICAL DATA:  Intractable nausea vomiting. EXAM: PORTABLE ABDOMEN - 1 VIEW COMPARISON:  CT abdomen pelvis April 08, 2020 FINDINGS: The bowel gas pattern is normal. No radio-opaque calculi or other significant radiographic abnormality are seen. Patchy opacity of medial left lung base is identified. IMPRESSION: No bowel obstruction. Electronically Signed   By: Abelardo Diesel M.D.   On: 04/18/2020 12:04     Medications:     Current Medications: . sodium chloride   Intravenous Once  . allopurinol  300 mg Oral Daily  . buPROPion  100 mg Oral BID  . Chlorhexidine Gluconate Cloth  6 each Topical Daily  . digoxin  0.125 mg Oral Daily  . feeding supplement  1 Container Oral TID BM  . morphine      . pantoprazole (PROTONIX) IV  40 mg Intravenous Q12H  . polyethylene glycol  17 g Oral QHS  . ramelteon  8 mg Oral QHS  . sodium chloride flush  10-40 mL Intracatheter Q12H  . sodium chloride flush  3 mL Intravenous Q12H  . sodium chloride flush  3 mL Intravenous Q12H  . spironolactone  12.5 mg Oral Daily  . thiamine  100 mg Oral Daily    Infusions: . sodium chloride    . sodium chloride    . sodium chloride Stopped (04/16/20 1832)  . sodium chloride Stopped (04/16/20 1832)  . sodium chloride Stopped (04/19/20 0737)  . heparin 2,050 Units/hr (04/19/20 0800)  . milrinone 0.125 mcg/kg/min (04/18/20 1951)  . piperacillin-tazobactam (ZOSYN)  IV Stopped (04/19/20 0551)       Assessment/Plan   1. 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 Also R femoral DVT - s/p Penumbra clot extraction 3/18 - hemodynamically improved can stop milrinone - still having some hemoptysis but has slowed down significantly  - Repeat limited  echo today  2. 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  - cMRI LVEF 12% RVEF 16% - also complicated by RV failure from PE - coox 70% on milrinone 0.125, will d/c milrinone - CVP 8, continue to hold lasix today - Continue spiro - Add entresto 24/26 BID - No b-blocker with shock  3. LV thrombus: -2.3 x 3.6 cm apical thrombus - continue heparin for today. Likely can switch to doac tomorrow  4. Hypokalemia  - resolved  5. Severe nausea - on admit had concern for acute cholecystitis/pancreatitis but I suspect due more to RV failure - has improved with large bowel movement yesterday - continue antiemetics watch QT  6. Hematuria -intermittent this admission and has had on a past admissions -repeat urinalysis and add urine cytology -likely will need urology follow up on d/c  7. Blood Loss anemia - slow downtrend from hemoptysis - monitor daily, seems to be slowing down  Stable for transfer to [facility]  Length of Stay: 7  Katherine Roan, MD  04/19/2020, 8:32 AM  Advanced Heart Failure Team Pager [phone] (M-F; [place])  Please contact [facility] for night-coverage after hours (4p -7a ) and weekends on [email or web address]  Agree with above.  Remains on milrinone. Co-ox ok. CVP 8-9. N/v improving. Remains on heparin. Hemoptysis is slowing. Still having some hematuria  General:  Sitting up in bed  No resp difficulty HEENT: normal Neck: supple. JVP 8-9 . Carotids 2+ bilat; no bruits. No lymphadenopathy or thryomegaly appreciated. Cor: PMI nondisplaced. Regular tachy  Lungs: clear Abdomen: soft, nontender, nondistended. No hepatosplenomegaly. No bruits or masses. Good bowel sounds. Extremities: no cyanosis, clubbing, rash, edema Neuro: alert & orientedx3, cranial nerves grossly intact. moves all 4 extremities w/o difficulty. Affect pleasant  Echo repeated today shows persistent severe LV dysfunction EF < 20% with large LV  thrombus and mild to moderate RV dysfunction.   Remains on milrinone. Will attempt to wean. Continue to titrate GDMT as tolerated. Continue heparin for now. Likely switch to [place] soon when more stable.   W/u hematuria with UA and urine cytology.   CRITICAL CARE Performed by: Glori Bickers  Total critical care time: 35 minutes  Critical care time was exclusive of separately billable procedures and treating other patients.  Critical care was necessary to treat or prevent imminent or life-threatening deterioration.  Critical care was time spent personally by me (independent of midlevel providers or residents) on the following activities: development of treatment plan with patient and/or surrogate as well as nursing, discussions with consultants, evaluation of patient's response to treatment, examination of patient, obtaining history from patient or surrogate, ordering and performing treatments and interventions, ordering and review of laboratory studies, ordering and review of radiographic studies, pulse oximetry and re-evaluation of patient's condition.   Glori Bickers, MD  5:56 PM

## 2020-04-19 NOTE — Progress Notes (Signed)
Pharmacy Antibiotic Note  Kevin Baxter is a 50 y.o. male admitted on 04/11/2020 with intra-abdominal infection.  Pharmacy has been consulted for Zosyn dosing. WBC remains wnl. Afebrile. Renal function improved, Scr stable 1.09, CrCl 109 ml/min.  Plan: Zosyn 3.375g IV Q8H infused over 4hrs. Follow up renal function, culture results, and clinical course. F/u LOT   Height: 6\' 1"  (185.4 cm) Weight: 120.3 kg (265 lb 3.4 oz) IBW/kg (Calculated) : 79.9  Temp (24hrs), Avg:98 F (36.7 C), Min:97.7 F (36.5 C), Max:98.2 F (36.8 C)  Recent Labs  Lab 04/15/20 0659 04/16/20 0445 04/16/20 0446 04/16/20 2348 04/17/20 0505 04/18/20 0555 04/19/20 0211  WBC 7.5 8.1  --  10.1 9.4 7.8 7.2  CREATININE 1.40*  --  1.40*  --  1.22 1.04 1.09    Estimated Creatinine Clearance: 109 mL/min (by C-G formula based on SCr of 1.09 mg/dL).    Allergies  Allergen Reactions  . Pseudoephedrine     REACTION: jittery    Antimicrobials this admission: 3/14 Zosyn >>   Dose adjustments this admission:  Microbiology results:  Richardine Service, PharmD, [place] PGY2 Cardiology Pharmacy Resident Phone: [phone] 04/19/2020  10:18 AM  Please check [email or web address] for unit-specific pharmacy phone numbers.

## 2020-04-19 NOTE — Progress Notes (Addendum)
[place] for Heparin Indication: DVT/PE and apical thrombus  Allergies  Allergen Reactions  . Pseudoephedrine     REACTION: jittery    Patient Measurements: Height: 6\' 1"  (185.4 cm) Weight: 120.3 kg (265 lb 3.4 oz) IBW/kg (Calculated) : 79.9 Heparin Dosing Weight: 110 kg  Vital Signs: Temp: 98.2 F (36.8 C) (03/21 1137) Temp Source: Oral (03/21 1137) BP: 136/97 (03/21 0800) Pulse Rate: 110 (03/21 0800)  Labs: Recent Labs    04/16/20 1727 04/16/20 2348 04/17/20 0505 04/18/20 0555 04/18/20 1304 04/18/20 2305 04/19/20 0211 04/19/20 1117  HGB  --    < > 14.2 13.4  --   --  12.9*  --   HCT  --    < > 44.4 42.4  --   --  39.6  --   PLT  --    < > 163 179  --   --  201  --   HEPARINUNFRC  --    < > 0.65 0.96*   < > 0.57 0.74* 0.61  CREATININE  --   --  1.22 1.04  --   --  1.09  --   TROPONINIHS 98*  --   --   --   --   --   --   --    < > = values in this interval not displayed.    Estimated Creatinine Clearance: 109 mL/min (by C-G formula based on SCr of 1.09 mg/dL).  Assessment: 50 y.o. male with RLE DVT and apical thrombus continuing on IV heparin. 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  CTA showed submassive bilateral PE with RHS, now s/p IR for thrombectomy 3/18. Heparin drip was not turned off at all during procedure. Patient did NOT receive thrombolytics.  Heparin level came back therapeutic at 0.61, on heparin infusion at 2050 units/hr. CBC stable. No infusion issues - still with some red-tinged urine. Plan to work up with UA and cytology.   If remains stable, will transition to oral AC in near future.   Goal of Therapy:  Heparin level 0.3-0.7 units/ml Monitor platelets by anticoagulation protocol: Yes   Plan:  Continue heparin at 2200 units/hr  Daily heparin level and CBC F/u for worsening of  hematuria   Antonietta Jewel, PharmD, [place] Pharmacist  Phone: [phone] 04/19/2020 3:04 PM  Please check AMION for all [place] phone numbers After 10:00 PM, call [place] [phone]   ADDENDUM [phone]): Patient coughing with blood tinged sputum and worsening hematuria per nursing. Given HL at higher end of goal, will reduce heparin infusion slightly to 1950 units/hr- will order 6 hr HL and will check CBC at that time.    Antonietta Jewel, PharmD, [place] Clinical Pharmacist

## 2020-04-20 DIAGNOSIS — R112 Nausea with vomiting, unspecified: Secondary | ICD-10-CM | POA: Diagnosis not present

## 2020-04-20 DIAGNOSIS — I82411 Acute embolism and thrombosis of right femoral vein: Secondary | ICD-10-CM | POA: Diagnosis not present

## 2020-04-20 DIAGNOSIS — I10 Essential (primary) hypertension: Secondary | ICD-10-CM | POA: Diagnosis not present

## 2020-04-20 DIAGNOSIS — K810 Acute cholecystitis: Secondary | ICD-10-CM | POA: Diagnosis not present

## 2020-04-20 DIAGNOSIS — E43 Unspecified severe protein-calorie malnutrition: Secondary | ICD-10-CM | POA: Insufficient documentation

## 2020-04-20 LAB — TYPE AND SCREEN
ABO/RH(D): O POS
Antibody Screen: NEGATIVE
Unit division: 0
Unit division: 0

## 2020-04-20 LAB — BPAM RBC
Blood Product Expiration Date: 202204212359
Blood Product Expiration Date: 202204212359
ISSUE DATE / TIME: 202203181409
ISSUE DATE / TIME: 202203182328
Unit Type and Rh: 5100
Unit Type and Rh: 5100

## 2020-04-20 LAB — URINALYSIS, MICROSCOPIC (REFLEX): RBC / HPF: >50 RBC/hpf (ref 0–5)

## 2020-04-20 LAB — URINALYSIS, ROUTINE W REFLEX MICROSCOPIC

## 2020-04-20 LAB — BASIC METABOLIC PANEL
Anion gap: 7 (ref 5–15)
BUN: 11 mg/dL (ref 6–20)
CO2: 27 mmol/L (ref 22–32)
Calcium: 8.5 mg/dL — ABNORMAL LOW (ref 8.9–10.3)
Chloride: 100 mmol/L (ref 98–111)
Creatinine, Ser: 0.96 mg/dL (ref 0.61–1.24)
GFR, Estimated: >60 mL/min (ref >60)
Glucose, Bld: 123 mg/dL — ABNORMAL HIGH (ref 70–99)
Potassium: 4.1 mmol/L (ref 3.5–5.1)
Sodium: 134 mmol/L — ABNORMAL LOW (ref 135–145)

## 2020-04-20 LAB — HEPARIN LEVEL (UNFRACTIONATED)
Heparin Unfractionated: 0.71 IU/mL — ABNORMAL HIGH (ref 0.30–0.70)
Heparin Unfractionated: 0.61 IU/mL (ref 0.30–0.70)

## 2020-04-20 LAB — FOLATE: Folate: 6.6 ng/mL (ref >5.9)

## 2020-04-20 LAB — HEPATIC FUNCTION PANEL
ALT: 48 U/L — ABNORMAL HIGH (ref 0–44)
AST: 41 U/L (ref 15–41)
Albumin: 2 g/dL — ABNORMAL LOW (ref 3.5–5.0)
Alkaline Phosphatase: 62 U/L (ref 38–126)
Bilirubin, Direct: 1.6 mg/dL — ABNORMAL HIGH (ref 0.0–0.2)
Indirect Bilirubin: 1.7 mg/dL — ABNORMAL HIGH (ref 0.3–0.9)
Total Bilirubin: 3.3 mg/dL — ABNORMAL HIGH (ref 0.3–1.2)
Total Protein: 6.1 g/dL — ABNORMAL LOW (ref 6.5–8.1)

## 2020-04-20 LAB — CBC
HCT: 39.9 % (ref 39.0–52.0)
Hemoglobin: 13 g/dL (ref 13.0–17.0)
MCH: 25.2 pg — ABNORMAL LOW (ref 26.0–34.0)
MCHC: 32.6 g/dL (ref 30.0–36.0)
MCV: 77.5 fL — ABNORMAL LOW (ref 80.0–100.0)
Platelets: 243 10*3/uL (ref 150–400)
RBC: 5.15 MIL/uL (ref 4.22–5.81)
RDW: 15.9 % — ABNORMAL HIGH (ref 11.5–15.5)
WBC: 6.7 10*3/uL (ref 4.0–10.5)
nRBC: 0 % (ref 0.0–0.2)

## 2020-04-20 LAB — COOXEMETRY PANEL
Carboxyhemoglobin: 1.2 % (ref 0.5–1.5)
Methemoglobin: 0.7 % (ref 0.0–1.5)
O2 Saturation: 62.7 %
Total hemoglobin: 12.3 g/dL (ref 12.0–16.0)

## 2020-04-20 LAB — DIGOXIN LEVEL: Digoxin Level: 0.4 ng/mL — ABNORMAL LOW (ref 0.8–2.0)

## 2020-04-20 LAB — IRON AND TIBC
Iron: 27 ug/dL — ABNORMAL LOW (ref 45–182)
Saturation Ratios: 12 % — ABNORMAL LOW (ref 17.9–39.5)
TIBC: 231 ug/dL — ABNORMAL LOW (ref 250–450)
UIBC: 204 ug/dL

## 2020-04-20 LAB — LIPASE, BLOOD: Lipase: 142 U/L — ABNORMAL HIGH (ref 11–51)

## 2020-04-20 LAB — RETICULOCYTES
Immature Retic Fract: 11.8 % (ref 2.3–15.9)
RBC.: 5.28 MIL/uL (ref 4.22–5.81)
Retic Count, Absolute: 68 10*3/uL (ref 19.0–186.0)
Retic Ct Pct: 1.3 % (ref 0.4–3.1)

## 2020-04-20 LAB — FERRITIN: Ferritin: 853 ng/mL — ABNORMAL HIGH (ref 24–336)

## 2020-04-20 LAB — VITAMIN B1: Vitamin B1 (Thiamine): 72.2 nmol/L (ref 66.5–200.0)

## 2020-04-20 LAB — VITAMIN B12: Vitamin B-12: 682 pg/mL (ref 180–914)

## 2020-04-20 MED ORDER — SENNOSIDES-DOCUSATE SODIUM 8.6-50 MG PO TABS
1.0000 | ORAL_TABLET | Freq: Every day | ORAL | Status: DC
  Administered 2020-04-22 – 2020-04-23 (×2): 1 via ORAL
  Filled 2020-04-20 (×5): qty 1

## 2020-04-20 NOTE — Progress Notes (Signed)
[place] for Heparin Indication: DVT/PE and apical thrombus  Allergies  Allergen Reactions  . Pseudoephedrine     REACTION: jittery    Patient Measurements: Height: 6\' 1"  (185.4 cm) Weight: 125.7 kg (277 lb 1.9 oz) IBW/kg (Calculated) : 79.9 Heparin Dosing Weight: 110 kg  Vital Signs: Temp: 98 F (36.7 C) (03/22 0825) Temp Source: Oral (03/22 0825) BP: 110/81 (03/22 0825) Pulse Rate: 107 (03/22 0825)  Labs: Recent Labs    04/18/20 0555 04/18/20 1304 04/19/20 0211 04/19/20 1117 04/19/20 2047 04/19/20 2141 04/20/20 0027 04/20/20 0837  HGB 13.4  --  12.9*  --  13.4  --  13.0  --   HCT 42.4  --  39.6  --  41.5  --  39.9  --   PLT 179  --  201  --  249  --  243  --   HEPARINUNFRC 0.96*   < > 0.74*   < >  --  0.55 0.71* 0.61  CREATININE 1.04  --  1.09  --   --   --   --  0.96   < > = values in this interval not displayed.    Estimated Creatinine Clearance: 126.4 mL/min (by C-G formula based on SCr of 0.96 mg/dL).  Assessment: 50 y.o. male with RLE DVT and apical thrombus continuing on IV heparin. 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  CTA showed submassive bilateral PE with RHS, now s/p IR for thrombectomy 3/18. Heparin drip was not turned off at all during procedure. Patient did NOT receive thrombolytics.  Heparin level now therapeutic at 0.61 after decreasing infusion drip rate to 1850 units/hr. Lab drew from periphery - heparin running in central line. CBC stable. Continues to have some hematuria intermittently, no more hemoptysis noted. Noted plan to work up with UA and cytology.   If remains stable, will transition to oral AC in near future.   Goal of Therapy:  Heparin level 0.3-0.7 units/ml Monitor platelets by anticoagulation protocol: Yes   Plan:  Continue IV heparin at 1850 units/hr Check daily HL,  CBC, s/sx bleeding  Richardine Service, PharmD, BCPS PGY2 Cardiology Pharmacy Resident Phone: [phone] 04/20/2020  9:55 AM  Please check [email or web address] for unit-specific pharmacy phone numbers.

## 2020-04-20 NOTE — Progress Notes (Addendum)
Advanced Heart Failure Team Rounding Note   Primary Physician: Kathlene November Primary Cardiologist:  Fransico Him  Reason for Consultation: Biventricular CHF  HPI:    Events - 3/17 cath with normal cor. RV strain on RHC.  - Post cath developed CP and hemoptysis. CT with submassive bilateral PE and RUL and RML infarcts.  - 3/18 underwent Penumbra clot extraction   CVP 8 and coox 63% off milrinone .  HR improving, BP lower with entresto but adequate.  Minimal to no hemoptysis but hematuria persists.  Labs pending  Repeat limited ECHO 3/21 with continued severe LV dysfunction EF <20% w/large LV thrombus, mild-mod RV dysfunction   Objective:    Vital Signs:   Temp:  [97.6 F (36.4 C)-98.2 F (36.8 C)] 97.7 F (36.5 C) (03/22 0300) Pulse Rate:  [103-117] 103 (03/22 0300) Resp:  [20-28] 20 (03/22 0300) BP: (90-136)/(64-97) 106/76 (03/22 0300) SpO2:  [94 %-100 %] 96 % (03/22 0300) Weight:  [125.7 kg] 125.7 kg (03/22 0500) Last BM Date: 04/18/20  Weight change: Filed Weights   04/18/20 0317 04/19/20 2043 04/20/20 0500  Weight: 120.3 kg 125.7 kg 125.7 kg    Intake/Output:   Intake/Output Summary (Last 24 hours) at 04/20/2020 0728 Last data filed at 04/20/2020 0600 Gross per 24 hour  Intake 666.66 ml  Output 300 ml  Net 366.66 ml      Physical Exam    General: appears comfortable, eating breakfast, not in distress Cardiac: JVD 8, mildly tachycardic with normal rhythm, clear s1 and s2, no murmurs, rubs or gallops, no LE edema Pulmonary: faint rales bibasilar, not in distress Abdominal: non distended abdomen, soft and nontender, no RUQ tenderness with palpation Psych: Alert, conversant, in good spirits    Telemetry   Sinus tach 105 Personally reviewed   Labs   Basic Metabolic Panel: Recent Labs  Lab 04/14/20 0712 04/14/20 1850 04/15/20 0659 04/15/20 1527 04/16/20 0446 04/16/20 1320 04/16/20 1400 04/16/20 1549 04/17/20 0505 04/18/20 0555  04/19/20 0211  NA 139   < > 139   < > 136   < > 137 138 134* 133* 133*  K 4.3   < > 3.8   < > 3.9   < > 3.8 3.6 3.3* 3.6 3.9  CL 101   < > 90*  --  89*  --   --   --  90* 92* 97*  CO2 27   < > 34*  --  35*  --   --   --  35* 34* 29  GLUCOSE 114*   < > 116*  --  134*  --   --   --  177* 116* 105*  BUN 24*   < > 24*  --  27*  --   --   --  26* 18 14  CREATININE 1.31*   < > 1.40*  --  1.40*  --   --   --  1.22 1.04 1.09  CALCIUM 9.1   < > 8.8*  --  9.0  --   --   --  8.6* 8.5* 8.5*  MG  --   --   --   --   --   --   --   --   --  2.0  --   PHOS 4.9*  --  5.2*  --  4.5  --   --   --  3.4 2.9  --    < > = values in this interval not  displayed.    Liver Function Tests: Recent Labs  Lab 04/15/20 0659 04/16/20 0445 04/16/20 0446 04/17/20 0505 04/18/20 0555 04/19/20 0211  AST 56* 52*  --  38 34 35  ALT 111* 92*  --  62* 51* 47*  ALKPHOS 63 65  --  55 52 54  BILITOT 5.0* 5.0*  --  4.1* 3.6* 3.4*  PROT 6.8 7.0  --  6.2* 5.9* 5.9*  ALBUMIN 2.7*  2.6* 2.6* 2.6* 2.3*  2.4* 2.2* 2.1*   Recent Labs  Lab 04/16/20 0445 04/17/20 0505 04/18/20 0555 04/19/20 0211 04/20/20 0027  LIPASE 152* 170* 156* 185* 142*   No results for input(s): AMMONIA in the last 168 hours.  CBC: Recent Labs  Lab 04/17/20 0505 04/18/20 0555 04/19/20 0211 04/19/20 2047 04/20/20 0027  WBC 9.4 7.8 7.2 7.0 6.7  HGB 14.2 13.4 12.9* 13.4 13.0  HCT 44.4 42.4 39.6 41.5 39.9  MCV 79.1* 80.0 78.0* 78.4* 77.5*  PLT 163 179 201 249 243    Cardiac Enzymes: No results for input(s): CKTOTAL, CKMB, CKMBINDEX, TROPONINI in the last 168 hours.  BNP: BNP (last 3 results) Recent Labs    04/12/20 1216 04/16/20 0406  BNP 1,153.9* 885.4*    ProBNP (last 3 results) No results for input(s): PROBNP in the last 8760 hours.   CBG: No results for input(s): GLUCAP in the last 168 hours.  Coagulation Studies: No results for input(s): LABPROT, INR in the last 72 hours.   Imaging   ECHOCARDIOGRAM  LIMITED  Result Date: 04/19/2020    ECHOCARDIOGRAM LIMITED REPORT   Patient Name:   SHLOMO SERES III Date of Exam: 04/19/2020 Medical Rec #:  [id number]          Height:       73.0 in Accession #:    [id number]         Weight:       265.2 lb Date of Birth:  Jan 06, 1971         BSA:          2.426 m Patient Age:    50 years           BP: Patient Gender: M                  HR: Exam Location:  Inpatient Procedure: 2D Echo Indications:    CHF; I42.9 Cardiomyopathy (unspecified)  History:        Patient has prior history of Echocardiogram examinations.  Sonographer:    Luisa Hart RDCS Referring Phys: [id number] Madilynn Montante R Airik Goodlin IMPRESSIONS  1. Large echodensity at LV apex consistent with mural thrombus Measures 35 x 22 mm. Not significantly changed from echo done on 04/12/20.Marland Kitchen Left ventricular ejection fraction, by estimation, is <20%. The left ventricle has severely decreased function. The  left ventricle demonstrates global hypokinesis. The left ventricular internal cavity size was severely dilated.  2. Right ventricular systolic function is mildly reduced. The right ventricular size is mildly enlarged.  3. Trivial mitral valve regurgitation.  4. The inferior vena cava is normal in size with <50% respiratory variability, suggesting right atrial pressure of 8 mmHg. Comparison(s): The left ventricular function is unchanged. FINDINGS  Left Ventricle: Large echodensity at LV apex consistent with mural thrombus Measures 35 x 22 mm. Not significantly changed from echo done on 04/12/20. Left ventricular ejection fraction, by estimation, is <20%. The left ventricle has severely decreased function. The left ventricle demonstrates global hypokinesis. The left ventricular internal cavity size was severely  dilated. There is no left ventricular hypertrophy. Right Ventricle: The right ventricular size is mildly enlarged. Right ventricular systolic function is mildly reduced. Left Atrium: Left atrial size was normal in size. Right  Atrium: Right atrial size was normal in size. Pericardium: There is no evidence of pericardial effusion. Mitral Valve: There is mild thickening of the mitral valve leaflet(s). Trivial mitral valve regurgitation. Tricuspid Valve: The tricuspid valve is normal in structure. Tricuspid valve regurgitation is mild. Pulmonic Valve: The pulmonic valve was normal in structure. Pulmonic valve regurgitation is mild. Aorta: The aortic root and ascending aorta are structurally normal, with no evidence of dilitation. Venous: The inferior vena cava is normal in size with less than 50% respiratory variability, suggesting right atrial pressure of 8 mmHg. LEFT VENTRICLE PLAX 2D LVIDd:         6.20 cm  Diastology LVIDs:         5.50 cm  LV e' medial:    2.83 cm/s LV PW:         1.10 cm  LV E/e' medial:  7.0 LV IVS:        0.90 cm  LV e' lateral:   4.03 cm/s LVOT diam:     2.60 cm  LV E/e' lateral: 4.9 LVOT Area:     5.31 cm  RIGHT VENTRICLE RV S prime:     14.00 cm/s TAPSE (M-mode): 1.7 cm LEFT ATRIUM             Index       RIGHT ATRIUM           Index LA diam:        3.70 cm 1.53 cm/m  RA Area:     20.40 cm LA Vol (A2C):   58.2 ml 23.99 ml/m RA Volume:   52.80 ml  21.77 ml/m LA Vol (A4C):   56.4 ml 23.25 ml/m LA Biplane Vol: 59.4 ml 24.49 ml/m                        PULMONIC VALVE AORTA                 PV Vmax:       0.76 m/s Ao Root diam: 3.70 cm PV Vmean:      49.400 cm/s Ao Asc diam:  3.50 cm PV VTI:        0.100 m                       PV Peak grad:  2.3 mmHg                       PV Mean grad:  1.0 mmHg  MITRAL VALVE               TRICUSPID VALVE MV Area (PHT): 5.34 cm    TR Peak grad:   42.5 mmHg MV Decel Time: 142 msec    TR Vmax:        326.00 cm/s MV E velocity: 19.70 cm/s MV A velocity: 90.80 cm/s  SHUNTS MV E/A ratio:  0.22        Systemic Diam: 2.60 cm Dorris Carnes MD Electronically signed by Dorris Carnes MD Signature Date/Time: 04/19/2020/4:13:48 PM    Final      Medications:     Current Medications: .  sodium chloride   Intravenous Once  . allopurinol  300 mg Oral Daily  . Chlorhexidine Gluconate  Cloth  6 each Topical Daily  . digoxin  0.125 mg Oral Daily  . feeding supplement  1 Container Oral TID BM  . feeding supplement (PROSource TF)  45 mL Per Tube TID  . morphine      . pantoprazole (PROTONIX) IV  40 mg Intravenous Q24H  . polyethylene glycol  17 g Oral QHS  . ramelteon  8 mg Oral QHS  . sacubitril-valsartan  1 tablet Oral BID  . sodium chloride flush  10-40 mL Intracatheter Q12H  . sodium chloride flush  3 mL Intravenous Q12H  . sodium chloride flush  3 mL Intravenous Q12H  . spironolactone  12.5 mg Oral Daily  . thiamine  100 mg Oral Daily    Infusions: . sodium chloride    . sodium chloride    . sodium chloride Stopped (04/16/20 1832)  . sodium chloride Stopped (04/16/20 1832)  . sodium chloride Stopped (04/19/20 1837)  . heparin 1,850 Units/hr (04/20/20 0600)       Assessment/Plan   1. 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 Also R femoral DVT - s/p Penumbra clot extraction 3/18 - hemodynamically improved can stop milrinone - minimal to no hemoptysis - limited echo with persistent RV dysfunction   2. 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  - cMRI LVEF 12% RVEF 44% - also complicated by RV failure from PE -Repeat limited ECHO 3/21 with continued severe LV dysfunction EF <20% w/large LV thrombus, mild-mod RV dysfunction - coox at 63% off milrinone  - CVP 8, continue to hold lasix today - Continue spiro - continue entresto 24/26 BID - No b-blocker with shock  3. LV thrombus: -2.3 x 3.6 cm apical thrombus - On heparin. Likely can switch to doac soon  4. Hypokalemia  - resolved  5. Severe nausea - on admit had concern for acute  cholecystitis/pancreatitis but I suspect due more to RV failure - has improved with large bowel movement on 3/20, no bm yesterday, will increase maintenance bowel regimen with addition of senokot -able to eat yesterday did not require antiemetics  6. Hematuria -has been intermittent during admission, persistent overnight, consult urology -urine cytology and repeat UA pending  7. Blood Loss anemia - slow downtrend from hemoptysis>hematuria,  hgb stable at 13 - monitor daily   Length of Stay: [place], [place]  04/20/2020, 7:28 AM  Advanced Heart Failure Team Pager [phone] (M-F; [place])  Please contact [facility] for night-coverage after hours (4p -7a ) and weekends on [email or web address]  Patient seen and examined with the above-signed Advanced Practice Provider and/or Housestaff. I personally reviewed laboratory data, imaging studies and relevant notes. I independently examined the patient and formulated the important aspects of the plan. I have edited the note to reflect any of my changes or salient points. I have personally discussed the plan with the patient and/or family.  Now off milrinone. Co-ox stable. CVP 8. BP improved on entresto and spiro. On heparin. No further hemoptysis but still with hematuria  Echo yesterday with persistent severe LV dysfunction EF < 20% + LV clot moderate RV dysfunction  General:  Well appearing. No resp difficulty HEENT: normal Neck: supple. no JVD. Carotids 2+ bilat; no bruits. No lymphadenopathy or thryomegaly appreciated. Cor: PMI nondisplaced. Regular rate & rhythm. No rubs, gallops  or murmurs. Lungs: clear Abdomen: soft, nontender, nondistended. No hepatosplenomegaly. No bruits or masses. Good bowel sounds. Extremities: no cyanosis, clubbing, rash, edema Neuro: alert & orientedx3, cranial nerves grossly intact. moves all 4 extremities w/o difficulty. Affect pleasant  Tolerating milrinone wean. Continue to titrate GDMT as BP allows. Will  ambulate. I have discussed hematuria with Urology. They will see later today (thanks). Consider switch to NOAC after Urology sees.   Glori Bickers, MD  12:33 PM

## 2020-04-20 NOTE — Consult Note (Signed)
I have been asked to see the patient by Dr. Glori Bickers, for evaluation and management of gross hematuria.  History of present illness: 50 year old African-American male who has been unwell for the past for 5 months.  He was admitted recently for pancreatitis and failure to thrive.  He subsequently was readmitted for similar symptoms.  Ultimately, the patient was found to be in profound heart failure with a large thrombus in his right ventricular and ultimately developed a pulmonary embolism.  He is now anticoagulated.  During this admission the patient has had intermittent gross hematuria.  He is otherwise largely asymptomatic.  He did have a CT scan on 04/09/2020 which was done with contrast of the abdomen and pelvis.  This demonstrated normal kidneys and ureter.  There was no significant notable abnormality within the bladder.  The patient is a non-smoker.  He does not do any illicit substances.  There is no family history of GU cancer.  Review of systems: A 12 point comprehensive review of systems was obtained and is negative unless otherwise stated in the history of present illness.  Patient Active Problem List   Diagnosis Date Noted  . Protein-calorie malnutrition, severe 04/20/2020  . Congestive heart failure (CHF) ([place])   . Acute saddle pulmonary embolism with acute cor pulmonale (HCC)   . Localized swelling of right lower extremity 04/12/2020  . Multiple gastric polyps 04/12/2020  . Poor responsiveness after MAC anesthesia 04/09/2020 04/12/2020  . Hematemesis with nausea 04/12/2020  . 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  . Left ventricular ejection fraction less than 20% 04/12/2020  . Acute acalculous cholecystitis 04/12/2020  . Mural thrombus of cardiac apex 04/12/2020  . Acute deep vein thrombosis (DVT) of right femoral vein ([place]) 04/12/2020  . Intractable nausea and vomiting 04/11/2020  . Weight loss, unintentional 04/06/2020  . Esophageal dysphagia 04/06/2020  .  Abnormal LFTs 04/06/2020  . Depression, major, single episode, moderate ([place]) 03/02/2020  . Pancreatitis, acute 02/26/2020  . Hx of adenomatous colonic polyps   . Morbid obesity ([place]) 04/22/2015  . PCP NOTES >>>>>>>>>>>>>>>>>>>>>>>>> 04/22/2015  . Hyperglycemia 03/11/2014  . Erectile dysfunction 07/21/2011  . Annual physical exam 01/05/2011  . Gout 03/14/2006  . Essential hypertension 03/14/2006  . ALLERGIC RHINITIS 03/14/2006    No current facility-administered medications on file prior to encounter.   Current Outpatient Medications on File Prior to Encounter  Medication Sig Dispense Refill  . allopurinol (ZYLOPRIM) 300 MG tablet Take 1 tablet (300 mg total) by mouth daily. 30 tablet 0  . buPROPion (WELLBUTRIN) 100 MG tablet Take 1 tablet a day x 1 week, then 1 tab BID (Patient taking differently: Take 100 mg by mouth 2 (two) times daily.) 60 tablet 1  . dicyclomine (BENTYL) 20 MG tablet Take 1 tablet (20 mg total) by mouth every 6 (six) hours as needed for spasms. (Patient taking differently: Take 20 mg by mouth daily.) 90 tablet 0  . losartan (COZAAR) 50 MG tablet Take 2 tablets (100 mg total) by mouth daily. 180 tablet 0  . ondansetron (ZOFRAN ODT) 4 MG disintegrating tablet Take 1 tablet (4 mg total) by mouth every 8 (eight) hours as needed for nausea or vomiting. 20 tablet 0  . sucralfate (CARAFATE) 1 g tablet Take 1 tablet (1 g total) by mouth 4 (four) times daily -  with meals and at bedtime. (Patient taking differently: Take 1 g by mouth daily.) 21 tablet 0  . pantoprazole (PROTONIX) 40 MG tablet Take 1 tablet (40 mg  total) by mouth daily. (Patient not taking: No sig reported) 30 tablet 3    Past Medical History:  Diagnosis Date  . Allergy   . Anxiety   . Arthritis   . Fundic gland polyposis of stomach   . GERD with esophagitis   . Gout   . Hx of adenomatous colonic polyps   . Hypertension     Past Surgical History:  Procedure Laterality Date  . COLONOSCOPY  05/2019   . ESOPHAGOGASTRODUODENOSCOPY  05/2019  . HAND SURGERY Right    2006 for a FX  . IR ANGIOGRAM PULMONARY BILATERAL SELECTIVE  04/16/2020  . IR ANGIOGRAM SELECTIVE EACH ADDITIONAL VESSEL  04/16/2020  . IR ANGIOGRAM SELECTIVE EACH ADDITIONAL VESSEL  04/16/2020  . IR THROMBECT PRIM MECH INIT (INCLU) MOD SED  04/16/2020  . IR US GUIDE VASC ACCESS LEFT  04/16/2020  . IR US GUIDE VASC ACCESS LEFT  04/16/2020  . IR US GUIDE VASC ACCESS RIGHT  04/16/2020  . RADIOLOGY WITH ANESTHESIA N/A 04/16/2020   Procedure: IR WITH ANESTHESIA- PULMONARY EMBOLUS INTERVENTION;  Surgeon: Suzette Battiest, MD;  Location: [facility];  Service: Radiology;  Laterality: N/A;  . RIGHT/LEFT HEART CATH AND CORONARY ANGIOGRAPHY N/A 04/15/2020   Procedure: RIGHT/LEFT HEART CATH AND CORONARY ANGIOGRAPHY;  Surgeon: Jolaine Artist, MD;  Location: [facility];  Service: Cardiovascular;  Laterality: N/A;    Social History   Tobacco Use  . Smoking status: Never Smoker  . Smokeless tobacco: Never Used  Vaping Use  . Vaping Use: Never used  Substance Use Topics  . Alcohol use: Yes    Comment: very rare   . Drug use: No    Family History  Problem Relation Age of Onset  . Cancer Mother        CERVICAL  . Hypertension Father   . Heart failure Father        no MI, d/t etoh  . Diabetes Sister   . Diabetes Sister   . Diabetes Paternal Aunt   . Diabetes Cousin   . Colon cancer Neg Hx   . Prostate cancer Neg Hx   . Stroke Neg Hx   . Esophageal cancer Neg Hx   . Rectal cancer Neg Hx   . Stomach cancer Neg Hx     PE: Vitals:   04/20/20 0825 04/20/20 1215 04/20/20 1750 04/20/20 2001  BP: 110/81 112/80 106/89 104/79  Pulse: (!) 107 (!) 114 (!) 108 (!) 111  Resp: 20 20 (!) 23 (!) 30  Temp: 98 F (36.7 C) 98.2 F (36.8 C) 98.3 F (36.8 C) 98 F (36.7 C)  TempSrc: Oral Oral Oral Oral  SpO2: 96% 95%  95%  Weight:      Height:       Patient appears to be in no acute distress  patient is alert and oriented  x3 Atraumatic normocephalic head No cervical or supraclavicular lymphadenopathy appreciated No increased work of breathing, no audible wheezes/rhonchi Regular sinus rhythm/rate Abdomen is soft, nontender, nondistended, no CVA or suprapubic tenderness Lower extremities are symmetric without appreciable edema Grossly neurologically intact No identifiable skin lesions  Recent Labs    04/19/20 0211 04/19/20 2047 04/20/20 0027  WBC 7.2 7.0 6.7  HGB 12.9* 13.4 13.0  HCT 39.6 41.5 39.9   Recent Labs    04/18/20 0555 04/19/20 0211 04/20/20 0837  NA 133* 133* 134*  K 3.6 3.9 4.1  CL 92* 97* 100  CO2 34* 29 27  GLUCOSE 116*  105* 123*  BUN _0 CREATININE 1.04 1.09 0.96  CALCIUM 8.5* 8.5* 8.5*   No results for input(s): LABPT, INR in the last 72 hours. No results for input(s): LABURIN in the last 72 hours. Results for orders placed or performed during the hospital encounter of 04/11/20  Resp Panel by RT-PCR (Flu A&B, Covid) Nasopharyngeal Swab     Status: None   Collection Time: 04/11/20 11:40 AM   Specimen: Nasopharyngeal Swab; Nasopharyngeal(NP) swabs in vial transport medium  Result Value Ref Range Status   SARS Coronavirus 2 by RT PCR NEGATIVE NEGATIVE Final    Comment: (NOTE) SARS-CoV-2 target nucleic acids are NOT DETECTED.  The SARS-CoV-2 RNA is generally detectable in upper respiratory specimens during the acute phase of infection. The lowest concentration of SARS-CoV-2 viral copies this assay can detect is 138 copies/mL. A negative result does not preclude SARS-Cov-2 infection and should not be used as the sole basis for treatment or other patient management decisions. A negative result may occur with  improper specimen collection/handling, submission of specimen other than nasopharyngeal swab, presence of viral mutation(s) within the areas targeted by this assay, and inadequate number of viral copies(<138 copies/mL). A negative result must be combined  with clinical observations, patient history, and epidemiological information. The expected result is Negative.  Fact Sheet for Patients:  EntrepreneurPulse.com.au  Fact Sheet for Healthcare Providers:  IncredibleEmployment.be  This test is no t yet approved or cleared by the [place] FDA and  has been authorized for detection and/or diagnosis of SARS-CoV-2 by FDA under an Emergency Use Authorization (EUA). This EUA will remain  in effect (meaning this test can be used) for the duration of the COVID-19 declaration under Section 564(b)(1) of the Act, 21 U.S.C.section 360bbb-3(b)(1), unless the authorization is terminated  or revoked sooner.       Influenza A by PCR NEGATIVE NEGATIVE Final   Influenza B by PCR NEGATIVE NEGATIVE Final    Comment: (NOTE) The Xpert Xpress SARS-CoV-2/FLU/RSV plus assay is intended as an aid in the diagnosis of influenza from Nasopharyngeal swab specimens and should not be used as a sole basis for treatment. Nasal washings and aspirates are unacceptable for Xpert Xpress SARS-CoV-2/FLU/RSV testing.  Fact Sheet for Patients: EntrepreneurPulse.com.au  Fact Sheet for Healthcare Providers: IncredibleEmployment.be  This test is not yet approved or cleared by the [place] FDA and has been authorized for detection and/or diagnosis of SARS-CoV-2 by FDA under an Emergency Use Authorization (EUA). This EUA will remain in effect (meaning this test can be used) for the duration of the COVID-19 declaration under Section 564(b)(1) of the Act, 21 U.S.C. section 360bbb-3(b)(1), unless the authorization is terminated or revoked.  Performed at [facility], [address] [place], [place] [address]   MRSA PCR Screening     Status: None   Collection Time: 04/16/20  8:01 PM   Specimen: Nasopharyngeal  Result Value Ref Range Status   MRSA by PCR NEGATIVE NEGATIVE Final     Comment:        The GeneXpert MRSA Assay (FDA approved for NASAL specimens only), is one component of a comprehensive MRSA colonization surveillance program. It is not intended to diagnose MRSA infection nor to guide or monitor treatment for MRSA infections. Performed at [facility], [place] [address], [place], [place] [address]     Imaging: I reviewed the patient's CAT scan from March 11 which was largely unremarkable from a GU perspective  Imp:  The patient has had intermittent gross hematuria of no definable etiology to this point.  His CAT scan was largely unremarkable ruling out any etiology of the hematuria being associated with the upper tract.  He still needs cystoscopy to rule out any lower urinary tract cause, but the CT scan was reassuring for any significant bladder pathology.  I suspect that the bleeding is coming from his prostate which is the most common cause in this circumstance.  Recommendations: I spoke to the patient as well as his wife about his current circumstance.   I reassured them and recommended that he follow-up with me as an outpatient so that we can complete the hematuria evaluation.  Once the patient is discharged home from the hospital we will get him scheduled for follow-up within the next few weeks for cystoscopy.   Thank you for involving me in this patient's care, please page with any further questions or concerns. Ardis Hughs

## 2020-04-20 NOTE — Progress Notes (Signed)
Asked patient if he would like to walk in room, ambulate from bed to chair and then sit up for awhile, can try moving with 2 person assist (Nurse tech in room)--patient stated he is very tired, did not sleep well last night, would like to rest for awhile and then possibly walk a little this afternoon, patient asked to let me know when he is ready to ambulate, will assist at that time

## 2020-04-20 NOTE — Anesthesia Postprocedure Evaluation (Signed)
Anesthesia Post Note  Patient: JAMANI BEARCE III  Procedure(s) Performed: IR WITH ANESTHESIA- PULMONARY EMBOLUS INTERVENTION (N/A )     Patient location during evaluation: Other Anesthesia Type: MAC Level of consciousness: awake and alert Pain management: pain level controlled Vital Signs Assessment: post-procedure vital signs reviewed and stable Respiratory status: spontaneous breathing and patient connected to nasal cannula oxygen Cardiovascular status: stable and blood pressure returned to baseline Postop Assessment: no apparent nausea or vomiting Anesthetic complications: no   No complications documented.  Last Vitals:  Vitals:   04/20/20 0825 04/20/20 1215  BP: 110/81 112/80  Pulse: (!) 107 (!) 114  Resp: 20 20  Temp: 36.7 C 36.8 C  SpO2: 96% 95%    Last Pain:  Vitals:   04/20/20 1215  TempSrc: Oral  PainSc: 0-No pain                 Nicko Daher

## 2020-04-20 NOTE — Plan of Care (Signed)
  Problem: Education: Goal: Knowledge of General Education information will improve Description: Including pain rating scale, medication(s)/side effects and non-pharmacologic comfort measures Outcome: Progressing   Problem: Health Behavior/Discharge Planning: Goal: Ability to manage health-related needs will improve Outcome: Progressing   Problem: Clinical Measurements: Goal: Respiratory complications will improve Outcome: Progressing Goal: Cardiovascular complication will be avoided Outcome: Progressing   Problem: Pain Managment: Goal: General experience of comfort will improve Outcome: Progressing

## 2020-04-20 NOTE — Progress Notes (Addendum)
[facility] for Heparin Indication: DVT/PE and apical thrombus  Allergies  Allergen Reactions  . Pseudoephedrine     REACTION: jittery    Patient Measurements: Height: 6\' 1"  (185.4 cm) Weight: 125.7 kg (277 lb 1.9 oz) IBW/kg (Calculated) : 79.9 Heparin Dosing Weight: 110 kg  Vital Signs: Temp: 97.6 F (36.4 C) (03/21 2300) Temp Source: Oral (03/21 2300) BP: 98/70 (03/21 2300) Pulse Rate: 104 (03/21 2300)  Labs: Recent Labs    04/17/20 0505 04/18/20 0555 04/18/20 1304 04/19/20 0211 04/19/20 1117 04/19/20 2047 04/19/20 2141 04/20/20 0027  HGB 14.2 13.4  --  12.9*  --  13.4  --  13.0  HCT 44.4 42.4  --  39.6  --  41.5  --  39.9  PLT 163 179  --  201  --  249  --  243  HEPARINUNFRC 0.65 0.96*   < > 0.74* 0.61  --  0.55 0.71*  CREATININE 1.22 1.04  --  1.09  --   --   --   --    < > = values in this interval not displayed.    Estimated Creatinine Clearance: 111.4 mL/min (by C-G formula based on SCr of 1.09 mg/dL).  Assessment: 50 y.o. male with RLE DVT and apical thrombus continuing on IV heparin. 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  CTA showed submassive bilateral PE with RHS, now s/p IR for thrombectomy 3/18. Heparin drip was not turned off at all during procedure. Patient did NOT receive thrombolytics.  Heparin level came back slightly supratherapeutic (0.71) on heparin infusion at 1950 units/hr. Lab drew from periphery - heparin running in central line. CBC stable. No infusion issues - no more red-tinged urine on this shift. Noted plan to work up with UA and cytology.   If remains stable, will transition to oral AC in near future.   Goal of Therapy:  Heparin level 0.3-0.7 units/ml Monitor platelets by anticoagulation protocol: Yes   Plan:  Decrease IV heparin infusion to 1850 units/hr Will f/u 6 hr  heparin level  Sherlon Handing, PharmD, BCPS Please see amion for complete clinical pharmacist phone list  04/20/2020 1:53 AM    Addendum 0345 - pt with red-tinged urine again. Similar to previous episodes. No changes needed at this time.  Sherlon Handing, PharmD, BCPS Please see amion for complete clinical pharmacist phone list 04/20/2020 3:44 AM

## 2020-04-21 DIAGNOSIS — I10 Essential (primary) hypertension: Secondary | ICD-10-CM | POA: Diagnosis not present

## 2020-04-21 DIAGNOSIS — K810 Acute cholecystitis: Secondary | ICD-10-CM | POA: Diagnosis not present

## 2020-04-21 DIAGNOSIS — R112 Nausea with vomiting, unspecified: Secondary | ICD-10-CM | POA: Diagnosis not present

## 2020-04-21 DIAGNOSIS — I82411 Acute embolism and thrombosis of right femoral vein: Secondary | ICD-10-CM | POA: Diagnosis not present

## 2020-04-21 LAB — HEPATIC FUNCTION PANEL
ALT: 50 U/L — ABNORMAL HIGH (ref 0–44)
AST: 47 U/L — ABNORMAL HIGH (ref 15–41)
Albumin: 1.9 g/dL — ABNORMAL LOW (ref 3.5–5.0)
Alkaline Phosphatase: 71 U/L (ref 38–126)
Bilirubin, Direct: 1.2 mg/dL — ABNORMAL HIGH (ref 0.0–0.2)
Indirect Bilirubin: 1.2 mg/dL — ABNORMAL HIGH (ref 0.3–0.9)
Total Bilirubin: 2.4 mg/dL — ABNORMAL HIGH (ref 0.3–1.2)
Total Protein: 5.8 g/dL — ABNORMAL LOW (ref 6.5–8.1)

## 2020-04-21 LAB — BASIC METABOLIC PANEL
Anion gap: 7 (ref 5–15)
BUN: 11 mg/dL (ref 6–20)
CO2: 26 mmol/L (ref 22–32)
Calcium: 8.4 mg/dL — ABNORMAL LOW (ref 8.9–10.3)
Chloride: 99 mmol/L (ref 98–111)
Creatinine, Ser: 0.91 mg/dL (ref 0.61–1.24)
GFR, Estimated: >60 mL/min (ref >60)
Glucose, Bld: 111 mg/dL — ABNORMAL HIGH (ref 70–99)
Potassium: 4.3 mmol/L (ref 3.5–5.1)
Sodium: 132 mmol/L — ABNORMAL LOW (ref 135–145)

## 2020-04-21 LAB — CBC
HCT: 38.8 % — ABNORMAL LOW (ref 39.0–52.0)
Hemoglobin: 12.8 g/dL — ABNORMAL LOW (ref 13.0–17.0)
MCH: 25.7 pg — ABNORMAL LOW (ref 26.0–34.0)
MCHC: 33 g/dL (ref 30.0–36.0)
MCV: 77.8 fL — ABNORMAL LOW (ref 80.0–100.0)
Platelets: 257 10*3/uL (ref 150–400)
RBC: 4.99 MIL/uL (ref 4.22–5.81)
RDW: 16 % — ABNORMAL HIGH (ref 11.5–15.5)
WBC: 6.6 10*3/uL (ref 4.0–10.5)
nRBC: 0 % (ref 0.0–0.2)

## 2020-04-21 LAB — JAK2 EXONS 12-15

## 2020-04-21 LAB — JAK2  V617F QUAL. WITH REFLEX TO EXON 12: Reflex:: 15

## 2020-04-21 LAB — HEPARIN LEVEL (UNFRACTIONATED): Heparin Unfractionated: 0.44 IU/mL (ref 0.30–0.70)

## 2020-04-21 LAB — COOXEMETRY PANEL
Carboxyhemoglobin: 1.2 % (ref 0.5–1.5)
Methemoglobin: 0.9 % (ref 0.0–1.5)
O2 Saturation: 56.9 %
Total hemoglobin: 13 g/dL (ref 12.0–16.0)

## 2020-04-21 LAB — LIPASE, BLOOD: Lipase: 119 U/L — ABNORMAL HIGH (ref 11–51)

## 2020-04-21 LAB — CYTOLOGY - NON PAP

## 2020-04-21 MED ORDER — APIXABAN 5 MG PO TABS: 5.0000 mg | ORAL_TABLET | Freq: Two times a day (BID) | ORAL | Status: DC

## 2020-04-21 MED ORDER — FUROSEMIDE 10 MG/ML IJ SOLN
80.0000 mg | Freq: Two times a day (BID) | INTRAMUSCULAR | Status: DC
  Administered 2020-04-21 – 2020-04-22 (×2): 80 mg via INTRAVENOUS
  Filled 2020-04-21 (×2): qty 8

## 2020-04-21 MED ORDER — APIXABAN 5 MG PO TABS
10.0000 mg | ORAL_TABLET | Freq: Two times a day (BID) | ORAL | Status: DC
  Administered 2020-04-21 – 2020-04-24 (×7): 10 mg via ORAL
  Filled 2020-04-21 (×8): qty 2

## 2020-04-21 NOTE — Progress Notes (Signed)
Transitions of Care Pharmacist Note  Kevin Baxter is a 50 y.o. male that has been diagnosed with DVT and PE and will be prescribed Eliquis (apixaban) at discharge.   Patient Education: I provided the following education on Eliquis to the patient: How to take the medication Described what the medication is Signs of bleeding Signs/symptoms of VTE and stroke  Answered their questions  Discharge Medications Plan: The patient wants to have their discharge medications filled by the Transitions of Care pharmacy rather than their usual pharmacy.  The discharge orders pharmacy has been changed to the Transitions of Care pharmacy, the patient will receive a phone call regarding co-pay, and their medications will be delivered by the Transitions of Care pharmacy.   Thank you,   Fara Olden, PharmD PGY-1 Pharmacy Resident 04/21/2020 5:19 PM

## 2020-04-21 NOTE — Progress Notes (Addendum)
Advanced Heart Failure Team Rounding Note   Primary Physician: Kathlene November Primary Cardiologist:  Fransico Him  Reason for Consultation: Biventricular CHF  HPI:    Events - 3/17 cath with normal cor. RV strain on RHC.  - Post cath developed CP and hemoptysis. CT with submassive bilateral PE and RUL and RML infarcts.  - 3/18 underwent Penumbra clot extraction  -3/21 mirinone d/ced repeat limited ECHO with  continued severe LV dysfunction EF <20% w/large LV thrombus, mild-mod RV dysfunction  CVP remains 8 and coox slightly lower today at 57%, day 3 off milrinone.  Still having pain with deep breaths.  Only used IS when I was in the room today.  Hemoptysis persists but better, hematuria persists.     Objective:    Vital Signs:   Temp:  [97.8 F (36.6 C)-98.3 F (36.8 C)] 97.8 F (36.6 C) (03/23 0300) Pulse Rate:  [106-114] 106 (03/23 0300) Resp:  [20-28] 26 (03/23 0300) BP: (101-112)/(78-89) 103/80 (03/23 0300) SpO2:  [95 %-97 %] 97 % (03/23 0300) Weight:  [127.4 kg] 127.4 kg (03/23 0500) Last BM Date: 04/18/20  Weight change: Filed Weights   04/19/20 2043 04/20/20 0500 04/21/20 0500  Weight: 125.7 kg 125.7 kg 127.4 kg    Intake/Output:   Intake/Output Summary (Last 24 hours) at 04/21/2020 0747 Last data filed at 04/21/2020 0300 Gross per 24 hour  Intake 1045.24 ml  Output 1000 ml  Net 45.24 ml      Physical Exam    General: appears comfortable, eating breakfast, not in distress Cardiac: JVD 8, mildly tachycardic with normal rhythm, clear s1 and s2, no murmurs, rubs or gallops, no LE edema Pulmonary: faint rales bibasilar, not in distress Abdominal: non distended abdomen, soft and nontender, no RUQ tenderness with palpation Psych: Alert, conversant, in good spirits    Telemetry   Sinus tach 105, one run of NSVT 12 beats  Personally reviewed   Labs   Basic Metabolic Panel: Recent Labs  Lab 04/15/20 0659 04/15/20 1527 04/16/20 0446 04/16/20 1320  04/17/20 0505 04/18/20 0555 04/19/20 0211 04/20/20 0837 04/21/20 0038  NA 139   < > 136   < > 134* 133* 133* 134* 132*  K 3.8   < > 3.9   < > 3.3* 3.6 3.9 4.1 4.3  CL 90*  --  89*  --  90* 92* 97* 100 99  CO2 34*  --  35*  --  35* 34* 29 27 26   GLUCOSE 116*  --  134*  --  177* 116* 105* 123* 111*  BUN 24*  --  27*  --  26* 18 14 11 11   CREATININE 1.40*  --  1.40*  --  1.22 1.04 1.09 0.96 0.91  CALCIUM 8.8*  --  9.0  --  8.6* 8.5* 8.5* 8.5* 8.4*  MG  --   --   --   --   --  2.0  --   --   --   PHOS 5.2*  --  4.5  --  3.4 2.9  --   --   --    < > = values in this interval not displayed.    Liver Function Tests: Recent Labs  Lab 04/17/20 0505 04/18/20 0555 04/19/20 0211 04/20/20 0837 04/21/20 0038  AST 38 34 35 41 47*  ALT 62* 51* 47* 48* 50*  ALKPHOS 55 52 54 62 71  BILITOT 4.1* 3.6* 3.4* 3.3* 2.4*  PROT 6.2* 5.9* 5.9* 6.1* 5.8*  ALBUMIN 2.3*  2.4* 2.2* 2.1* 2.0* 1.9*   Recent Labs  Lab 04/17/20 0505 04/18/20 0555 04/19/20 0211 04/20/20 0027 04/21/20 0038  LIPASE 170* 156* 185* 142* 119*   No results for input(s): AMMONIA in the last 168 hours.  CBC: Recent Labs  Lab 04/18/20 0555 04/19/20 0211 04/19/20 2047 04/20/20 0027 04/21/20 0038  WBC 7.8 7.2 7.0 6.7 6.6  HGB 13.4 12.9* 13.4 13.0 12.8*  HCT 42.4 39.6 41.5 39.9 38.8*  MCV 80.0 78.0* 78.4* 77.5* 77.8*  PLT 179 201 249 243 257    Cardiac Enzymes: No results for input(s): CKTOTAL, CKMB, CKMBINDEX, TROPONINI in the last 168 hours.  BNP: BNP (last 3 results) Recent Labs    04/12/20 1216 04/16/20 0406  BNP 1,153.9* 885.4*    ProBNP (last 3 results) No results for input(s): PROBNP in the last 8760 hours.   CBG: No results for input(s): GLUCAP in the last 168 hours.  Coagulation Studies: No results for input(s): LABPROT, INR in the last 72 hours.   Imaging   No results found.   Medications:     Current Medications: . sodium chloride   Intravenous Once  . allopurinol  300 mg Oral  Daily  . Chlorhexidine Gluconate Cloth  6 each Topical Daily  . digoxin  0.125 mg Oral Daily  . feeding supplement  1 Container Oral TID BM  . feeding supplement (PROSource TF)  45 mL Per Tube TID  . morphine      . pantoprazole (PROTONIX) IV  40 mg Intravenous Q24H  . polyethylene glycol  17 g Oral QHS  . ramelteon  8 mg Oral QHS  . sacubitril-valsartan  1 tablet Oral BID  . senna-docusate  1 tablet Oral Daily  . sodium chloride flush  10-40 mL Intracatheter Q12H  . sodium chloride flush  3 mL Intravenous Q12H  . sodium chloride flush  3 mL Intravenous Q12H  . spironolactone  12.5 mg Oral Daily  . thiamine  100 mg Oral Daily    Infusions: . sodium chloride    . sodium chloride    . sodium chloride Stopped (04/16/20 1832)  . sodium chloride Stopped (04/16/20 1832)  . sodium chloride Stopped (04/19/20 1837)  . heparin 1,850 Units/hr (04/20/20 1844)       Assessment/Plan   1. 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 Also R femoral DVT - s/p Penumbra clot extraction 3/18 - hemodynamically improved can stop milrinone - minimal to no hemoptysis - limited echo 04/19/20 with persistent RV dysfunction   2. 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  - cMRI LVEF 12% RVEF 47% - also complicated by RV failure from PE -Repeat limited ECHO 04/19/20 with continued severe LV dysfunction EF <20% w/large LV thrombus, mild-mod RV dysfunction - coox at 57% off milrinone  - CVP 8, continue to hold lasix today - Continue spiro - continue entresto 24/26 BID - No b-blocker with low normal CO and severely reduced EF  3. LV thrombus: -2.3 x 3.6 cm apical thrombus - On heparin. Switch to DOAC today  4. Hypokalemia  - resolved  5. Severe nausea -resolved  6. Hematuria -hematuria  persists, urology consulted and saw patient, they will manage this in the outpatient setting  7. Blood Loss anemia - slow downtrend from hemoptysis>hematuria,  hgb stable at  12.8 - monitor daily   Length of Stay: 9  Katherine Roan, MD  04/21/2020, 7:47 AM  Advanced Heart Failure Team Pager [phone] (M-F; [place])  Please contact [facility] for night-coverage after hours (4p -7a ) and weekends on [email or web address]   Patient seen and examined with the above-signed Advanced Practice Provider and/or Housestaff. I personally reviewed laboratory data, imaging studies and relevant notes. I independently examined the patient and formulated the important aspects of the plan. I have edited the note to reflect any of my changes or salient points. I have personally discussed the plan with the patient and/or family.  Has been off milrinone for several days. Co-ox marginal. Says he was more SOB when walking today. Continues to have some chest tightness. Still with some hematuria as well.   General:  Lying in bed  No resp difficulty HEENT: normal Neck: supple JVP 8. Carotids 2+ bilat; no bruits. No lymphadenopathy or thryomegaly appreciated. Cor: PMI nondisplaced. Regular tachy +s3 Lungs: clear Abdomen: soft, nontender, nondistended. No hepatosplenomegaly. No bruits or masses. Good bowel sounds. Extremities: no cyanosis, clubbing, rash, edema Neuro: alert & orientedx3, cranial nerves grossly intact. moves all 4 extremities w/o difficulty. Affect pleasant  He remains quite tenuous with marginal co-ox and prominent gallop on exam. Need to follow co-ox closely. Weight up 15 pounds. Need to restart diuretics, Heparin switched to apixaban today. Discussed dosing with PharmD personally.  Glori Bickers, MD  9:35 PM

## 2020-04-21 NOTE — Discharge Instructions (Signed)
Information on my medicine - ELIQUIS (apixaban)  This medication education was reviewed with me or my healthcare representative as part of my discharge preparation.    Why was Eliquis prescribed for you? Eliquis was prescribed to treat blood clots that may have been found in the veins of your legs (deep vein thrombosis) or in your lungs (pulmonary embolism) and to reduce the risk of them occurring again.  What do You need to know about Eliquis ? The starting dose is 10 mg (two 5 mg tablets) taken TWICE daily for the FIRST SEVEN (7) DAYS, then on 04/28/20  the dose is reduced to ONE 5 mg tablet taken TWICE daily.  Eliquis may be taken with or without food.   Try to take the dose about the same time in the morning and in the evening. If you have difficulty swallowing the tablet whole please discuss with your pharmacist how to take the medication safely.  Take Eliquis exactly as prescribed and DO NOT stop taking Eliquis without talking to the doctor who prescribed the medication.  Stopping may increase your risk of developing a new blood clot.  Refill your prescription before you run out.  After discharge, you should have regular check-up appointments with your healthcare provider that is prescribing your Eliquis.    What do you do if you miss a dose? If a dose of ELIQUIS is not taken at the scheduled time, take it as soon as possible on the same day and twice-daily administration should be resumed. The dose should not be doubled to make up for a missed dose.  Important Safety Information A possible side effect of Eliquis is bleeding. You should call your healthcare provider right away if you experience any of the following: ? Bleeding from an injury or your nose that does not stop. ? Unusual colored urine (red or dark [place]) or unusual colored stools (red or black). ? Unusual bruising for unknown reasons. ? A serious fall or if you hit your head (even if there is no bleeding).  Some  medicines may interact with Eliquis and might increase your risk of bleeding or clotting while on Eliquis. To help avoid this, consult your healthcare provider or pharmacist prior to using any new prescription or non-prescription medications, including herbals, vitamins, non-steroidal anti-inflammatory drugs (NSAIDs) and supplements.  This website has more information on Eliquis (apixaban): http://www.eliquis.com/eliquis/home

## 2020-04-22 ENCOUNTER — Ambulatory Visit: Payer: 59 | Admitting: Cardiology

## 2020-04-22 DIAGNOSIS — I10 Essential (primary) hypertension: Secondary | ICD-10-CM | POA: Diagnosis not present

## 2020-04-22 DIAGNOSIS — I82411 Acute embolism and thrombosis of right femoral vein: Secondary | ICD-10-CM | POA: Diagnosis not present

## 2020-04-22 DIAGNOSIS — R112 Nausea with vomiting, unspecified: Secondary | ICD-10-CM | POA: Diagnosis not present

## 2020-04-22 DIAGNOSIS — K810 Acute cholecystitis: Secondary | ICD-10-CM | POA: Diagnosis not present

## 2020-04-22 LAB — CBC
HCT: 38.7 % — ABNORMAL LOW (ref 39.0–52.0)
Hemoglobin: 12.4 g/dL — ABNORMAL LOW (ref 13.0–17.0)
MCH: 25.4 pg — ABNORMAL LOW (ref 26.0–34.0)
MCHC: 32 g/dL (ref 30.0–36.0)
MCV: 79.1 fL — ABNORMAL LOW (ref 80.0–100.0)
Platelets: 310 10*3/uL (ref 150–400)
RBC: 4.89 MIL/uL (ref 4.22–5.81)
RDW: 16.2 % — ABNORMAL HIGH (ref 11.5–15.5)
WBC: 7.3 10*3/uL (ref 4.0–10.5)
nRBC: 0 % (ref 0.0–0.2)

## 2020-04-22 LAB — BASIC METABOLIC PANEL
Anion gap: 6 (ref 5–15)
BUN: 12 mg/dL (ref 6–20)
CO2: 29 mmol/L (ref 22–32)
Calcium: 8.6 mg/dL — ABNORMAL LOW (ref 8.9–10.3)
Chloride: 99 mmol/L (ref 98–111)
Creatinine, Ser: 0.91 mg/dL (ref 0.61–1.24)
GFR, Estimated: >60 mL/min (ref >60)
Glucose, Bld: 123 mg/dL — ABNORMAL HIGH (ref 70–99)
Potassium: 4.2 mmol/L (ref 3.5–5.1)
Sodium: 134 mmol/L — ABNORMAL LOW (ref 135–145)

## 2020-04-22 LAB — HEPATIC FUNCTION PANEL
ALT: 59 U/L — ABNORMAL HIGH (ref 0–44)
AST: 50 U/L — ABNORMAL HIGH (ref 15–41)
Albumin: 2 g/dL — ABNORMAL LOW (ref 3.5–5.0)
Alkaline Phosphatase: 70 U/L (ref 38–126)
Bilirubin, Direct: 1.1 mg/dL — ABNORMAL HIGH (ref 0.0–0.2)
Indirect Bilirubin: 1.5 mg/dL — ABNORMAL HIGH (ref 0.3–0.9)
Total Bilirubin: 2.6 mg/dL — ABNORMAL HIGH (ref 0.3–1.2)
Total Protein: 6.4 g/dL — ABNORMAL LOW (ref 6.5–8.1)

## 2020-04-22 LAB — COOXEMETRY PANEL
Carboxyhemoglobin: 1.2 % (ref 0.5–1.5)
Methemoglobin: 0.7 % (ref 0.0–1.5)
O2 Saturation: 56.7 %
Total hemoglobin: 12.7 g/dL (ref 12.0–16.0)

## 2020-04-22 LAB — LIPASE, BLOOD: Lipase: 117 U/L — ABNORMAL HIGH (ref 11–51)

## 2020-04-22 MED ORDER — FUROSEMIDE 10 MG/ML IJ SOLN: 80.0000 mg | Freq: Every day | INTRAMUSCULAR | Status: DC

## 2020-04-22 NOTE — Progress Notes (Signed)
Nutrition Follow-up  DOCUMENTATION CODES:   Severe malnutrition in context of acute illness/injury  INTERVENTION:   High protein snacks between meals; discussed with pt, his wife. Also placed RN care order regarding snacks  Continue 30 ml ProSource Plus TID, each supplement provides 100 kcals and 15 grams protein.   Continue Boost Breeze po TID, each supplement provides 250 kcal and 9 grams of protein. If pt continues to not drink despite encouragement, plan to d/c  Questions regarding diet from pt and his wife were answered   NUTRITION DIAGNOSIS:   Severe Malnutrition related to acute illness as evidenced by percent weight loss,mild fat depletion,moderate muscle depletion,energy intake < 75% for > 7 days.  Ongoing but being addressed via supplements, snacks  GOAL:   Patient will meet greater than or equal to 90% of their needs  Progressing  MONITOR:   Diet advancement,Labs,Weight trends,I & O's  REASON FOR ASSESSMENT:   Malnutrition Screening Tool    ASSESSMENT:   50 y.o. male with medical history significant for hypertension, gout, anxiety/depression, morbid obesity, GERD, history of polyposis of his stomach and colonic polyps, reported esophageal dysphagia who has had abdominal pain nausea and vomiting going on for several weeks  Recent admission from 1/27-1/30 for similar complaint and treated for acute pancreatitis.Since discharge she has been having intractable nausea vomiting and spitting blood and not tolerating diet.  3/14 Admitted, HIDA with acute acalculous cholecystitis 3/17 Cath with normal coronary arteries, severe and ICM EF 15%, moderate pulm HTN 3/18 new onset chest pain, central submassive PE, clot extracted 3/20 Abd xray with normal bowel gas pattern 3/21 ECHO with continued severe LV dysfunction EF <20% with large LV thrombus  Pt with fair appetite, nausea has improved overall. Eating lots of fruit. Per wife, pt ate muffin, egg whites and fruit at  breakfast yesterday, some spaghetti at lunch with bites of salad, and some hamburger and some baked chips for dinner. Pt reports taking the Pro-Source supplement, being offered Boost Breeze but not really drinking much.  Noted per [facility], pt mostly refusing Boost Breeze, taking Pro-source more regularly. Reinforced the importance of adequate nutrition, in particular adequate protein intake. Pt and wife verbalized understanding.   Pt with snacks ordered by RD on last assessment; pt and wife indicating pt has not been receiving snacks. RD checked patient refrigerator and noted 2pm snack with patient's name on it for today in fridge. Notified pt and his wife that the snacks are present and that if he does not receive snacks as scheduled, they can ask staff to obtain snack. RN notified as well  Wife with questions regarding diet; all questions answered. Reinforced importance of low sodium diet, discussed seasoning options in lieu of salt. Also discussed importance of smaller, more frequent meals and limiting high fat/fried foods at this time giving cholecystitis and associated nausea and abdominal pain at times. Wife with questions regarding cooking methods, including use of air fryer. This was discussed as well   Current wt 121.9 kgl admit weight 135 kg. Net negative 13 L  Labs: sodium 134 (L), lipase 117 (H)) Meds: lasix, thiamine   Diet Order:   Diet Order            Diet Heart Room service appropriate? Yes; Fluid consistency: Thin  Diet effective now                 EDUCATION NEEDS:   No education needs have been identified at this time  Skin:  Skin Assessment:  Reviewed RN Assessment  Last BM:  3/23  Height:   Ht Readings from Last 1 Encounters:  04/11/20 6\' 1"  (1.854 m)    Weight:   Wt Readings from Last 1 Encounters:  04/22/20 121.9 kg    Ideal Body Weight:     BMI:  Body mass index is 35.46 kg/m.  Estimated Nutritional Needs:   Kcal:  2200-2400  Protein:   100-115g  Fluid:  2.2L/day   Kerman Passey MS, RDN, LDN, CNSC Registered Dietitian III Clinical Nutrition RD Pager and On-Call Pager Number Located in [place]

## 2020-04-22 NOTE — Progress Notes (Addendum)
Advanced Heart Failure Team Rounding Note   Primary Physician: Kathlene November Primary Cardiologist:  Fransico Him  Reason for Consultation: Biventricular CHF  HPI:    Events - 3/17 cath with normal cor. RV strain on RHC.  - Post cath developed CP and hemoptysis. CT with submassive bilateral PE and RUL and RML infarcts.  - 3/18 underwent Penumbra clot extraction  -3/21 mirinone d/ced repeat limited ECHO with  continued severe LV dysfunction EF <20% w/large LV thrombus, mild-mod RV dysfunction  CVP 7-8 brisk diuresis overnight with IV lasix. Coox stable today at 57%.  Pleuritic chest pain improving.  Using IS.  Said urine much lighter now but did get IV lasix.       Objective:    Vital Signs:   Temp:  [97.6 F (36.4 C)-98.6 F (37 C)] 97.7 F (36.5 C) (03/24 0743) Pulse Rate:  [85-117] 112 (03/24 0743) Resp:  [18-34] 24 (03/24 0743) BP: (86-109)/(71-84) 100/76 (03/24 0743) SpO2:  [93 %-98 %] 94 % (03/24 0743) Weight:  [121.9 kg] 121.9 kg (03/24 0454) Last BM Date: 04/21/20  Weight change: Filed Weights   04/20/20 0500 04/21/20 0500 04/22/20 0454  Weight: 125.7 kg 127.4 kg 121.9 kg    Intake/Output:   Intake/Output Summary (Last 24 hours) at 04/22/2020 0824 Last data filed at 04/22/2020 0700 Gross per 24 hour  Intake 632.05 ml  Output 3375 ml  Net -2742.95 ml      Physical Exam    General: appears comfortable,  not in distress Cardiac: JVD 7, mildly tachycardic with normal rhythm, clear s1 and s2, no murmurs, rubs or gallops, no LE edema Pulmonary: faint rales bibasilar, not in distress Abdominal: non distended abdomen, soft and nontender, no RUQ tenderness with palpation Psych: Alert, conversant, in good spirits    Telemetry   Sinus tach 110-115,   Personally reviewed   Labs   Basic Metabolic Panel: Recent Labs  Lab 04/16/20 0446 04/16/20 1320 04/17/20 0505 04/18/20 0555 04/19/20 0211 04/20/20 0837 04/21/20 0038 04/22/20 0448  NA 136   < >  134* 133* 133* 134* 132* 134*  K 3.9   < > 3.3* 3.6 3.9 4.1 4.3 4.2  CL 89*  --  90* 92* 97* 100 99 99  CO2 35*  --  35* 34* 29 27 26 29   GLUCOSE 134*  --  177* 116* 105* 123* 111* 123*  BUN 27*  --  26* 18 14 11 11 12   CREATININE 1.40*  --  1.22 1.04 1.09 0.96 0.91 0.91  CALCIUM 9.0  --  8.6* 8.5* 8.5* 8.5* 8.4* 8.6*  MG  --   --   --  2.0  --   --   --   --   PHOS 4.5  --  3.4 2.9  --   --   --   --    < > = values in this interval not displayed.    Liver Function Tests: Recent Labs  Lab 04/18/20 0555 04/19/20 0211 04/20/20 0837 04/21/20 0038 04/22/20 0448  AST 34 35 41 47* 50*  ALT 51* 47* 48* 50* 59*  ALKPHOS 52 54 62 71 70  BILITOT 3.6* 3.4* 3.3* 2.4* 2.6*  PROT 5.9* 5.9* 6.1* 5.8* 6.4*  ALBUMIN 2.2* 2.1* 2.0* 1.9* 2.0*   Recent Labs  Lab 04/18/20 0555 04/19/20 0211 04/20/20 0027 04/21/20 0038 04/22/20 0448  LIPASE 156* 185* 142* 119* 117*   No results for input(s): AMMONIA in the last 168 hours.  CBC: Recent Labs  Lab 04/19/20 0211 04/19/20 2047 04/20/20 0027 04/21/20 0038 04/22/20 0448  WBC 7.2 7.0 6.7 6.6 7.3  HGB 12.9* 13.4 13.0 12.8* 12.4*  HCT 39.6 41.5 39.9 38.8* 38.7*  MCV 78.0* 78.4* 77.5* 77.8* 79.1*  PLT 201 249 243 257 310    Cardiac Enzymes: No results for input(s): CKTOTAL, CKMB, CKMBINDEX, TROPONINI in the last 168 hours.  BNP: BNP (last 3 results) Recent Labs    04/12/20 1216 04/16/20 0406  BNP 1,153.9* 885.4*    ProBNP (last 3 results) No results for input(s): PROBNP in the last 8760 hours.   CBG: No results for input(s): GLUCAP in the last 168 hours.  Coagulation Studies: No results for input(s): LABPROT, INR in the last 72 hours.   Imaging   No results found.   Medications:     Current Medications: . sodium chloride   Intravenous Once  . allopurinol  300 mg Oral Daily  . apixaban  10 mg Oral BID   Followed by  . [START ON 04/28/2020] apixaban  5 mg Oral BID  . Chlorhexidine Gluconate Cloth  6 each  Topical Daily  . digoxin  0.125 mg Oral Daily  . feeding supplement  1 Container Oral TID BM  . feeding supplement (PROSource TF)  45 mL Per Tube TID  . furosemide  80 mg Intravenous BID  . morphine      . pantoprazole (PROTONIX) IV  40 mg Intravenous Q24H  . polyethylene glycol  17 g Oral QHS  . ramelteon  8 mg Oral QHS  . sacubitril-valsartan  1 tablet Oral BID  . senna-docusate  1 tablet Oral Daily  . sodium chloride flush  10-40 mL Intracatheter Q12H  . sodium chloride flush  3 mL Intravenous Q12H  . sodium chloride flush  3 mL Intravenous Q12H  . spironolactone  12.5 mg Oral Daily  . thiamine  100 mg Oral Daily    Infusions: . sodium chloride    . sodium chloride    . sodium chloride Stopped (04/16/20 1832)  . sodium chloride Stopped (04/16/20 1832)  . sodium chloride Stopped (04/19/20 1837)       Assessment/Plan   1. 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 Also R femoral DVT - s/p Penumbra clot extraction 3/18 - minimal to no hemoptysis - limited echo 04/19/20 with persistent RV dysfunction   2. 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  - cMRI LVEF 12% RVEF 81% - also complicated by RV failure from PE -Repeat limited ECHO 04/19/20 with continued severe LV dysfunction EF <20% w/large LV thrombus, mild-mod RV dysfunction - coox at 57% off milrinone  - Weight down 11lbs, brisk diuresis overnight.  Decrease lasix to daily.   - Continue spiro - continue entresto 24/26 BID - No b-blocker with low normal CO and severely reduced EF, no SGLT2i with GU issues  3. LV thrombus: -2.3 x 3.6 cm apical thrombus - On heparin. Switched to doac 04/21/20  4. Hypokalemia  - resolved  5. Severe nausea -resolved  6. Hematuria -has improved. urology consulted and saw patient,  they will manage this in the outpatient setting  7. Blood Loss anemia - slow downtrend from hemoptysis>hematuria,  hgb 12.8->12.4 - monitor daily   Length of Stay: [place], MD  04/22/2020,  8:24 AM  Advanced Heart Failure Team Pager [phone] (M-F; 7a - 4p)  Please contact [facility] for night-coverage after hours (4p -7a ) and weekends on [email or web address]  Patient seen and examined with the above-signed Advanced Practice Provider and/or Housestaff. I personally reviewed laboratory data, imaging studies and relevant notes. I independently examined the patient and formulated the important aspects of the plan. I have edited the note to reflect any of my changes or salient points. I have personally discussed the plan with the patient and/or family.  Diuresed well overnight on IV lasix. Weight down 12 pounds. Breathing better. Still tachycardic. Co-ox 57%   General:  Sitting up in bed. No resp difficulty HEENT: normal Neck: supple. JVP 6-7 Carotids 2+ bilat; no bruits. No lymphadenopathy or thryomegaly appreciated. Cor: PMI nondisplaced. Regular tachy + s3 Lungs: clear Abdomen: soft, nontender, nondistended. No hepatosplenomegaly. No bruits or masses. Good bowel sounds. Extremities: no cyanosis, clubbing, rash, edema Neuro: alert & orientedx3, cranial nerves grossly intact. moves all 4 extremities w/o difficulty. Affect pleasant  Remains tenuous but feels better after IV diuresis. Co-ox stable but marginal off milrinone. Remains tachy. Consider low-dose ivabradine tomorrow am. No bleeding on Eliquis. Continue to ambulate.   Glori Bickers, MD  4:14 PM

## 2020-04-22 NOTE — Progress Notes (Signed)
   04/22/20 0743  Assess: MEWS Score  Temp 97.7 F (36.5 C)  BP 100/76  Pulse Rate (!) 112  ECG Heart Rate (!) 111  Resp (!) 24  Level of Consciousness Alert  SpO2 94 %  O2 Device Nasal Cannula  Patient Activity (if Appropriate) In bed  O2 Flow Rate (L/min) 2 L/min  Assess: MEWS Score  MEWS Temp 0  MEWS Systolic 1  MEWS Pulse 2  MEWS RR 1  MEWS LOC 0  MEWS Score 4  MEWS Score Color Red  Assess: if the MEWS score is Yellow or Red  Were vital signs taken at a resting state? Yes  Focused Assessment No change from prior assessment  Early Detection of Sepsis Score *See Row Information* Low  MEWS guidelines implemented *See Row Information* No, previously red, continue vital signs every 4 hours  Take Vital Signs  Increase Vital Sign Frequency  Red: Q 1hr X 4 then Q 4hr X 4, if remains red, continue Q 4hrs

## 2020-04-22 NOTE — Plan of Care (Signed)

## 2020-04-22 NOTE — Progress Notes (Signed)
    GI COURTESY NOTE   He looks so much better compared to last time I saw him.  Gastric bxs were benign.  In retrospect GI Sx were from severe heart failure.   Note that he had pancreatitis in January.  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  Appreciate care.  Gatha Mayer, MD, [facility]

## 2020-04-22 NOTE — Progress Notes (Signed)
   04/22/20 1211  Assess: MEWS Score  Temp 97.7 F (36.5 C)  BP 99/76  Pulse Rate (!) 117  ECG Heart Rate (!) 116  Resp (!) 29  Level of Consciousness Alert  SpO2 95 %  O2 Device Nasal Cannula  Patient Activity (if Appropriate) In bed  O2 Flow Rate (L/min) 2 L/min  Assess: MEWS Score  MEWS Temp 0  MEWS Systolic 1  MEWS Pulse 2  MEWS RR 2  MEWS LOC 0  MEWS Score 5  MEWS Score Color Red  Assess: if the MEWS score is Yellow or Red  Were vital signs taken at a resting state? Yes  Focused Assessment No change from prior assessment  Early Detection of Sepsis Score *See Row Information* Low  MEWS guidelines implemented *See Row Information* No, previously red, continue vital signs every 4 hours  Take Vital Signs  Increase Vital Sign Frequency  Red: Q 1hr X 4 then Q 4hr X 4, if remains red, continue Q 4hrs

## 2020-04-23 ENCOUNTER — Other Ambulatory Visit (HOSPITAL_COMMUNITY): Payer: Self-pay

## 2020-04-23 ENCOUNTER — Telehealth (HOSPITAL_COMMUNITY): Payer: Self-pay | Admitting: Pharmacist

## 2020-04-23 DIAGNOSIS — I82411 Acute embolism and thrombosis of right femoral vein: Secondary | ICD-10-CM | POA: Diagnosis not present

## 2020-04-23 DIAGNOSIS — R112 Nausea with vomiting, unspecified: Secondary | ICD-10-CM | POA: Diagnosis not present

## 2020-04-23 DIAGNOSIS — K810 Acute cholecystitis: Secondary | ICD-10-CM | POA: Diagnosis not present

## 2020-04-23 DIAGNOSIS — I10 Essential (primary) hypertension: Secondary | ICD-10-CM | POA: Diagnosis not present

## 2020-04-23 LAB — HEPATIC FUNCTION PANEL
ALT: 67 U/L — ABNORMAL HIGH (ref 0–44)
AST: 58 U/L — ABNORMAL HIGH (ref 15–41)
Albumin: 2 g/dL — ABNORMAL LOW (ref 3.5–5.0)
Alkaline Phosphatase: 74 U/L (ref 38–126)
Bilirubin, Direct: 1.1 mg/dL — ABNORMAL HIGH (ref 0.0–0.2)
Indirect Bilirubin: 1.5 mg/dL — ABNORMAL HIGH (ref 0.3–0.9)
Total Bilirubin: 2.6 mg/dL — ABNORMAL HIGH (ref 0.3–1.2)
Total Protein: 6.8 g/dL (ref 6.5–8.1)

## 2020-04-23 LAB — BASIC METABOLIC PANEL
Anion gap: 7 (ref 5–15)
BUN: 13 mg/dL (ref 6–20)
CO2: 29 mmol/L (ref 22–32)
Calcium: 8.7 mg/dL — ABNORMAL LOW (ref 8.9–10.3)
Chloride: 97 mmol/L — ABNORMAL LOW (ref 98–111)
Creatinine, Ser: 0.93 mg/dL (ref 0.61–1.24)
GFR, Estimated: >60 mL/min (ref >60)
Glucose, Bld: 128 mg/dL — ABNORMAL HIGH (ref 70–99)
Potassium: 4.1 mmol/L (ref 3.5–5.1)
Sodium: 133 mmol/L — ABNORMAL LOW (ref 135–145)

## 2020-04-23 LAB — CBC
HCT: 40.3 % (ref 39.0–52.0)
Hemoglobin: 12.8 g/dL — ABNORMAL LOW (ref 13.0–17.0)
MCH: 25.1 pg — ABNORMAL LOW (ref 26.0–34.0)
MCHC: 31.8 g/dL (ref 30.0–36.0)
MCV: 79.2 fL — ABNORMAL LOW (ref 80.0–100.0)
Platelets: 364 10*3/uL (ref 150–400)
RBC: 5.09 MIL/uL (ref 4.22–5.81)
RDW: 16.1 % — ABNORMAL HIGH (ref 11.5–15.5)
WBC: 6.4 10*3/uL (ref 4.0–10.5)
nRBC: 0 % (ref 0.0–0.2)

## 2020-04-23 LAB — COOXEMETRY PANEL
Carboxyhemoglobin: 1.3 % (ref 0.5–1.5)
Methemoglobin: 0.7 % (ref 0.0–1.5)
O2 Saturation: 62.1 %
Total hemoglobin: 13.2 g/dL (ref 12.0–16.0)

## 2020-04-23 LAB — LIPASE, BLOOD: Lipase: 111 U/L — ABNORMAL HIGH (ref 11–51)

## 2020-04-23 MED ORDER — TORSEMIDE 20 MG PO TABS
20.0000 mg | ORAL_TABLET | Freq: Every day | ORAL | Status: DC
  Administered 2020-04-23: 20 mg via ORAL
  Filled 2020-04-23: qty 1

## 2020-04-23 MED ORDER — IVABRADINE HCL 5 MG PO TABS
2.5000 mg | ORAL_TABLET | Freq: Two times a day (BID) | ORAL | Status: DC
  Administered 2020-04-23: 2.5 mg via ORAL
  Filled 2020-04-23: qty 1

## 2020-04-23 MED ORDER — IVABRADINE HCL 5 MG PO TABS
2.5000 mg | ORAL_TABLET | Freq: Two times a day (BID) | ORAL | Status: DC
  Filled 2020-04-23: qty 1

## 2020-04-23 MED ORDER — IVABRADINE HCL 5 MG PO TABS
5.0000 mg | ORAL_TABLET | Freq: Two times a day (BID) | ORAL | Status: DC
  Administered 2020-04-23 – 2020-04-24 (×3): 5 mg via ORAL
  Filled 2020-04-23 (×3): qty 1

## 2020-04-23 NOTE — Plan of Care (Signed)

## 2020-04-23 NOTE — Telephone Encounter (Signed)
Advanced Heart Failure Patient Advocate Encounter  Prior Authorization for Corlanor has been approved.    PA# [id number] Effective dates: 04/23/20 through 04/23/2021  Audry Riles, PharmD, BCPS, BCCP, CPP Heart Failure Clinic Pharmacist [phone]

## 2020-04-23 NOTE — Progress Notes (Signed)
   04/23/20 0747  Assess: MEWS Score  Temp 97.7 F (36.5 C)  BP 100/74  Pulse Rate (!) 107  ECG Heart Rate (!) 109  Resp (!) 27  Level of Consciousness Alert  SpO2 97 %  O2 Device Nasal Cannula  O2 Flow Rate (L/min) 2 L/min  Assess: MEWS Score  MEWS Temp 0  MEWS Systolic 1  MEWS Pulse 1  MEWS RR 2  MEWS LOC 0  MEWS Score 4  MEWS Score Color Red  Assess: if the MEWS score is Yellow or Red  Were vital signs taken at a resting state? Yes  Focused Assessment No change from prior assessment  Early Detection of Sepsis Score *See Row Information* Low  MEWS guidelines implemented *See Row Information* No, previously red, continue vital signs every 4 hours  Treat  Pain Scale 0-10  Pain Score 0  Notify: Charge Nurse/RN  Name of Charge Nurse/RN Notified [place]  Notify: Provider  Provider Name/Title MDs aware  Document  Patient Outcome Other (Comment) (stable)  Progress note created (see row info) Yes

## 2020-04-23 NOTE — Progress Notes (Addendum)
Advanced Heart Failure Team Rounding Note   Primary Physician: Kathlene November Primary Cardiologist:  Fransico Him  Reason for Consultation: Biventricular CHF  HPI:    Events - 3/17 cath with normal cor. RV strain on RHC.  - Post cath developed CP and hemoptysis. CT with submassive bilateral PE and RUL and RML infarcts.  - 3/18 underwent Penumbra clot extraction  -3/21 mirinone d/ced repeat limited ECHO with  continued severe LV dysfunction EF <20% w/large LV thrombus, mild-mod RV dysfunction  Coox 62% CVP 7 today, continued to diurese yesterday on lasix 80 daily down from BID, weight down an additional 4.5lbs almost 35lbs since admission.  Denies dyspnea.  No hematuria or hemoptysis yesterday.  Denies chest pain.    Objective:    Vital Signs:   Temp:  [97.7 F (36.5 C)-98.5 F (36.9 C)] 97.7 F (36.5 C) (03/25 0747) Pulse Rate:  [107-117] 107 (03/25 0747) Resp:  [19-29] 27 (03/25 0747) BP: (96-106)/(74-82) 100/74 (03/25 0747) SpO2:  [91 %-97 %] 97 % (03/25 0747) Weight:  [119.8 kg] 119.8 kg (03/25 0350) Last BM Date: 04/21/20  Weight change: Filed Weights   04/21/20 0500 04/22/20 0454 04/23/20 0350  Weight: 127.4 kg 121.9 kg 119.8 kg    Intake/Output:   Intake/Output Summary (Last 24 hours) at 04/23/2020 0749 Last data filed at 04/23/2020 0748 Gross per 24 hour  Intake 960 ml  Output 3425 ml  Net -2465 ml      Physical Exam    General: appears comfortable,  not in distress Cardiac: JVD 7, mildly tachycardic with normal rhythm, clear s1 and s2, no murmurs, rubs or gallops, no LE edema Pulmonary: faint rales bibasilar, not in distress Abdominal: non distended abdomen, soft and nontender, no RUQ tenderness with palpation Psych: Alert, conversant, in good spirits    Telemetry   Sinus tach 110-115,   Personally reviewed   Labs   Basic Metabolic Panel: Recent Labs  Lab 04/17/20 0505 04/18/20 0555 04/19/20 0211 04/20/20 0837 04/21/20 0038  04/22/20 0448 04/23/20 0445  NA 134* 133* 133* 134* 132* 134* 133*  K 3.3* 3.6 3.9 4.1 4.3 4.2 4.1  CL 90* 92* 97* 100 99 99 97*  CO2 35* 34* 29 27 26 29 29   GLUCOSE 177* 116* 105* 123* 111* 123* 128*  BUN 26* 18 14 11 11 12 13   CREATININE 1.22 1.04 1.09 0.96 0.91 0.91 0.93  CALCIUM 8.6* 8.5* 8.5* 8.5* 8.4* 8.6* 8.7*  MG  --  2.0  --   --   --   --   --   PHOS 3.4 2.9  --   --   --   --   --     Liver Function Tests: Recent Labs  Lab 04/19/20 0211 04/20/20 0837 04/21/20 0038 04/22/20 0448 04/23/20 0445  AST 35 41 47* 50* 58*  ALT 47* 48* 50* 59* 67*  ALKPHOS 54 62 71 70 74  BILITOT 3.4* 3.3* 2.4* 2.6* 2.6*  PROT 5.9* 6.1* 5.8* 6.4* 6.8  ALBUMIN 2.1* 2.0* 1.9* 2.0* 2.0*   Recent Labs  Lab 04/19/20 0211 04/20/20 0027 04/21/20 0038 04/22/20 0448 04/23/20 0445  LIPASE 185* 142* 119* 117* 111*   No results for input(s): AMMONIA in the last 168 hours.  CBC: Recent Labs  Lab 04/19/20 2047 04/20/20 0027 04/21/20 0038 04/22/20 0448 04/23/20 0445  WBC 7.0 6.7 6.6 7.3 6.4  HGB 13.4 13.0 12.8* 12.4* 12.8*  HCT 41.5 39.9 38.8* 38.7* 40.3  MCV 78.4* 77.5*  77.8* 79.1* 79.2*  PLT 249 243 257 310 364    Cardiac Enzymes: No results for input(s): CKTOTAL, CKMB, CKMBINDEX, TROPONINI in the last 168 hours.  BNP: BNP (last 3 results) Recent Labs    04/12/20 1216 04/16/20 0406  BNP 1,153.9* 885.4*    ProBNP (last 3 results) No results for input(s): PROBNP in the last 8760 hours.   CBG: No results for input(s): GLUCAP in the last 168 hours.  Coagulation Studies: No results for input(s): LABPROT, INR in the last 72 hours.   Imaging   No results found.   Medications:     Current Medications: . sodium chloride   Intravenous Once  . allopurinol  300 mg Oral Daily  . apixaban  10 mg Oral BID   Followed by  . [START ON 04/28/2020] apixaban  5 mg Oral BID  . Chlorhexidine Gluconate Cloth  6 each Topical Daily  . digoxin  0.125 mg Oral Daily  . feeding  supplement  1 Container Oral TID BM  . feeding supplement (PROSource TF)  45 mL Per Tube TID  . morphine      . pantoprazole (PROTONIX) IV  40 mg Intravenous Q24H  . polyethylene glycol  17 g Oral QHS  . ramelteon  8 mg Oral QHS  . sacubitril-valsartan  1 tablet Oral BID  . senna-docusate  1 tablet Oral Daily  . sodium chloride flush  10-40 mL Intracatheter Q12H  . sodium chloride flush  3 mL Intravenous Q12H  . sodium chloride flush  3 mL Intravenous Q12H  . spironolactone  12.5 mg Oral Daily  . thiamine  100 mg Oral Daily    Infusions: . sodium chloride    . sodium chloride    . sodium chloride Stopped (04/16/20 1832)  . sodium chloride Stopped (04/16/20 1832)  . sodium chloride Stopped (04/19/20 1837)       Assessment/Plan   1. 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 Also R femoral DVT - s/p Penumbra clot extraction 3/18 - minimal to no hemoptysis - limited echo 04/19/20 with persistent RV dysfunction   2. 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  - cMRI LVEF 12% RVEF 79% - also complicated by RV failure from PE -Repeat limited ECHO 04/19/20 with continued severe LV dysfunction EF <20% w/large LV thrombus, mild-mod RV dysfunction - coox at 62% off milrinone  - cvp 7 switch to oral diuretic  - Continue spiro - continue entresto 24/26 BID - No b-blocker with low normal CO and severely reduced EF, no SGLT2i with GU issues - add low dose corlanor  3. LV thrombus: -2.3 x 3.6 cm apical thrombus - Initially on heparin. Switched to doac 04/21/20  4. Hypokalemia  - resolved  5. Severe nausea -resolved  6. Hematuria -seems to have resolved off heparin -Atypical urothelial cells on urine pathology. -urology consulted and saw patient, they will manage this in the  outpatient setting  7. Acute blood Loss anemia - slow downtrend from hemoptysis>hematuria which has resolved.  hgb stable at 12.8 - monitor daily   Length of Stay: [place], MD  04/23/2020, 7:49 AM  Advanced Heart Failure Team Pager [phone] (M-F; [place])  Please contact [facility] for night-coverage after hours (4p -7a ) and weekends on [email or web address]   Patient  seen and examined with the above-signed Advanced Practice Provider and/or Housestaff. I personally reviewed laboratory data, imaging studies and relevant notes. I independently examined the patient and formulated the important aspects of the plan. I have edited the note to reflect any of my changes or salient points. I have personally discussed the plan with the patient and/or family.  He diuresed well on Wednesday and feels much better now on po diuretics. Remains tachycardiac but co-oc stable off milrinone. No further hematuria or hemoptysis. On Eliquis. CVP 5  General:  Lying in bed No resp difficulty HEENT: normal Neck: supple. no JVD. Carotids 2+ bilat; no bruits. No lymphadenopathy or thryomegaly appreciated. Cor: PMI nondisplaced. Regular tachy Lungs: clear Abdomen: soft, nontender, nondistended. No hepatosplenomegaly. No bruits or masses. Good bowel sounds. Extremities: no cyanosis, clubbing, rash, edema Neuro: alert & orientedx3, cranial nerves grossly intact. moves all 4 extremities w/o difficulty. Affect pleasant  Co-ox ok but remains quite tachycardic in setting of severe NICM and PE. Volume status ok. Will increase ivabradine to 5 tid. CR to see. Possible home over the weekend if continues to improve.   Glori Bickers, MD  2:18 PM

## 2020-04-23 NOTE — Telephone Encounter (Signed)
Patient Advocate Encounter   Received notification from [employer] that prior authorization for Corlanor is required.   PA submitted on CoverMyMeds Key [id number] Status is pending   Will continue to follow.   Audry Riles, PharmD, BCPS, BCCP, CPP Heart Failure Clinic Pharmacist [phone]

## 2020-04-23 NOTE — Progress Notes (Signed)
[id number] Gave pt and wife CHF booklet and discussed importance of daily weights, signs/symptoms of CHF and when to call MD, 2000 mg sodium restriction, walking for ex, and CRP 2 . Gave low sodium diets. Pt interested in CRP 2 so referral made to [facility]. He would like wife to be his contact. Wife stated they have 13 steep stairs to bedroom. No recliner or bed or shower on lower level. Asked RN for PT order for stairs. Also discussed with pt using IS 10x an hour. He is getting to 1000 ml. Encouraged him to walk without oxygen and have RN monitor sats in case home oxygen needed. Understanding voiced by pt and wife. Graylon Good RN BSN 04/23/2020 3:31 PM

## 2020-04-23 NOTE — Progress Notes (Signed)
Error

## 2020-04-24 DIAGNOSIS — I5043 Acute on chronic combined systolic (congestive) and diastolic (congestive) heart failure: Secondary | ICD-10-CM

## 2020-04-24 LAB — CBC
HCT: 39.1 % (ref 39.0–52.0)
Hemoglobin: 12.7 g/dL — ABNORMAL LOW (ref 13.0–17.0)
MCH: 25.1 pg — ABNORMAL LOW (ref 26.0–34.0)
MCHC: 32.5 g/dL (ref 30.0–36.0)
MCV: 77.3 fL — ABNORMAL LOW (ref 80.0–100.0)
Platelets: 388 10*3/uL (ref 150–400)
RBC: 5.06 MIL/uL (ref 4.22–5.81)
RDW: 16.3 % — ABNORMAL HIGH (ref 11.5–15.5)
WBC: 5.6 10*3/uL (ref 4.0–10.5)
nRBC: 0 % (ref 0.0–0.2)

## 2020-04-24 LAB — BASIC METABOLIC PANEL
Anion gap: 9 (ref 5–15)
BUN: 18 mg/dL (ref 6–20)
CO2: 29 mmol/L (ref 22–32)
Calcium: 8.7 mg/dL — ABNORMAL LOW (ref 8.9–10.3)
Chloride: 96 mmol/L — ABNORMAL LOW (ref 98–111)
Creatinine, Ser: 1.16 mg/dL (ref 0.61–1.24)
GFR, Estimated: >60 mL/min (ref >60)
Glucose, Bld: 116 mg/dL — ABNORMAL HIGH (ref 70–99)
Potassium: 4.1 mmol/L (ref 3.5–5.1)
Sodium: 134 mmol/L — ABNORMAL LOW (ref 135–145)

## 2020-04-24 LAB — COOXEMETRY PANEL
Carboxyhemoglobin: 1.2 % (ref 0.5–1.5)
Carboxyhemoglobin: 1.3 % (ref 0.5–1.5)
Methemoglobin: 0.9 % (ref 0.0–1.5)
Methemoglobin: 0.8 % (ref 0.0–1.5)
O2 Saturation: 50.5 %
O2 Saturation: 56.2 %
Total hemoglobin: 14.8 g/dL (ref 12.0–16.0)
Total hemoglobin: 12.3 g/dL (ref 12.0–16.0)

## 2020-04-24 LAB — LIPASE, BLOOD: Lipase: 103 U/L — ABNORMAL HIGH (ref 11–51)

## 2020-04-24 LAB — HEPATIC FUNCTION PANEL
ALT: 72 U/L — ABNORMAL HIGH (ref 0–44)
AST: 60 U/L — ABNORMAL HIGH (ref 15–41)
Albumin: 2.1 g/dL — ABNORMAL LOW (ref 3.5–5.0)
Alkaline Phosphatase: 75 U/L (ref 38–126)
Bilirubin, Direct: 0.9 mg/dL — ABNORMAL HIGH (ref 0.0–0.2)
Indirect Bilirubin: 1.5 mg/dL — ABNORMAL HIGH (ref 0.3–0.9)
Total Bilirubin: 2.4 mg/dL — ABNORMAL HIGH (ref 0.3–1.2)
Total Protein: 6.8 g/dL (ref 6.5–8.1)

## 2020-04-24 MED ORDER — ALTEPLASE 2 MG IJ SOLR: 2.0000 mg | Freq: Once | INTRAMUSCULAR | Status: DC

## 2020-04-24 MED ORDER — TORSEMIDE 20 MG PO TABS: 20.0000 mg | ORAL_TABLET | Freq: Every day | ORAL | Status: DC

## 2020-04-24 MED ORDER — SACUBITRIL-VALSARTAN 24-26 MG PO TABS: 1.0000 | ORAL_TABLET | Freq: Two times a day (BID) | ORAL | 6 refills | Status: DC

## 2020-04-24 MED ORDER — IVABRADINE HCL 5 MG PO TABS: 5.0000 mg | ORAL_TABLET | Freq: Two times a day (BID) | ORAL | 6 refills | Status: DC

## 2020-04-24 MED ORDER — TORSEMIDE 20 MG PO TABS
20.0000 mg | ORAL_TABLET | Freq: Every day | ORAL | 6 refills | Status: DC
Start: 2020-04-25 — End: 2020-05-07

## 2020-04-24 MED ORDER — DIGOXIN 125 MCG PO TABS: 0.1250 mg | ORAL_TABLET | Freq: Every day | ORAL | 6 refills | Status: DC

## 2020-04-24 MED ORDER — APIXABAN 5 MG PO TABS: ORAL_TABLET | ORAL | 3 refills | Status: DC

## 2020-04-24 MED ORDER — APIXABAN 5 MG PO TABS: ORAL_TABLET | ORAL | 1 refills | Status: DC

## 2020-04-24 MED ORDER — ALTEPLASE 2 MG IJ SOLR
4.0000 mg | Freq: Once | INTRAMUSCULAR | Status: AC
  Administered 2020-04-24: 4 mg
  Filled 2020-04-24: qty 4

## 2020-04-24 MED ORDER — SPIRONOLACTONE 25 MG PO TABS: 12.5000 mg | ORAL_TABLET | Freq: Every day | ORAL | 6 refills | Status: DC

## 2020-04-24 NOTE — Discharge Summary (Signed)
Discharge Summary    Patient ID: ZEFERINO MOUNTS MRN: [id number]; DOB: Aug 03, 1970  Admit date: 04/11/2020 Discharge date: 04/24/2020  PCP:  Colon Branch, MD   [place]  Cardiologist:  Fransico Him, MD CHF: Dr. Haroldine Laws  Discharge Diagnoses    Principal Problem:   Intractable nausea and vomiting Active Problems:   Essential hypertension   Morbid obesity (HCC)   Pancreatitis, acute   Weight loss, unintentional   Abnormal LFTs   Localized swelling of right lower extremity   Multiple gastric polyps   Poor responsiveness after MAC anesthesia 04/09/2020   Hematemesis with nausea   Mural thrombus of left ventricle   Left ventricular ejection fraction less than 20%   Acute acalculous cholecystitis   Mural thrombus of cardiac apex   Acute deep vein thrombosis (DVT) of right femoral vein (HCC)   Congestive heart failure (CHF) ([place])   Acute saddle pulmonary embolism with acute cor pulmonale (HCC)   Protein-calorie malnutrition, severe   Diagnostic Studies/Procedures    Echo 04/12/20 1. There is a large apical thromus measuring 3.6 x 2.3 cm .   Marland Kitchen Left ventricular ejection fraction, by estimation, is <20%. The left  ventricle has severely decreased function. The left ventricle demonstrates  regional wall motion abnormalities (see scoring diagram/findings for  description). The left ventricular  internal cavity size was moderately to severely dilated. Left ventricular  diastolic function could not be evaluated. There is severe akinesis of the  left ventricular, mid-apical apical segment, anteroseptal wall,  inferoseptal wall and lateral wall.  2. Right ventricular systolic function was not well visualized. The right  ventricular size is not well visualized.  3. The mitral valve is grossly normal. No evidence of mitral valve  regurgitation.  4. The aortic valve is normal in structure. Aortic valve regurgitation is  not visualized. No aortic  stenosis is present.  5. Aortic dilatation noted. There is borderline dilatation of the  ascending aorta, measuring 39 mm.   RIGHT/LEFT HEART CATH AND CORONARY ANGIOGRAPHY  04/15/2020    Conclusion  Findings:  On milrinone 0.125 mcg/kg/min  Ao =125/92 (109) LV = 122/25 RA =  15 RV = 63/20 PA = 67/32 (46) PCW = 22 (v=28) Fick cardiac output/index = 5.0/2.0 PVR = 6.0 WU SVR = 1513 Ao sat = 97% PA sat = 66%, 67% PAPi = 2.33  Assessment: 1. Normal coronary arteries 2. 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 Moderate mixed pulmonary venous HTN 4. Elevated filling pressures 5. Marginal CI on milrinone  Plan/Discussion:  Continue medical therapy.   Cardiac MRI 04/15/2020 IMPRESSION: 1.  Severe left ventricular dilation.  2. Severe left ventricular systolic dysfunction (LVEF =12%). There is severe global hypokinesis with apical dyskinesis.  3.  Diffuse increase in ECV signal in the apex (30-40%).  4.  There is likely an LV thrombus.  5.  Severe right ventricular dilation.  6. Severe right ventricular systolic dysfunction (RVEF =21 %). Severe global ventricular hypokinesis with apical akinesis.  7.  Moderate right atrial dilation  8.  Pulmonary artery is mildly dilated at 40 mm  9. Tricuspid regurgitation and mitral regurgitation is visualized qualitatively.  Study consistent with dilated biventricular cardiomyopathy with LV thrombus   Limited echo 04/19/20 1. Large echodensity at LV apex consistent with mural thrombus Measures  35 x 22 mm. Not significantly changed from echo done on 04/12/20.Marland Kitchen Left  ventricular ejection fraction, by estimation, is <20%. The left ventricle  has severely decreased  function. The  left ventricle demonstrates global hypokinesis. The left ventricular  internal cavity size was severely dilated.  2. Right ventricular systolic function is mildly reduced. The right  ventricular size is mildly enlarged.  3. Trivial mitral  valve regurgitation.  4. The inferior vena cava is normal in size with <50% respiratory  variability, suggesting right atrial pressure of 8 mmHg.   History of Present Illness     JOCK MAHON III is a 50 y.o. male with HTN, morbid obesity, pancreatitis and GERD admitted for intractable nausea and vomiting.   Admitted in January with acute pancreatitis and mild Trop elevation at 30>33>40.  Had EGD on 3/11 for dysphagia and developed acute MS changes and negative MRI of the brain and discharged home.   Since then has had intractable N/V with hematemesis intermittently.  EGD was normal. GI recommended to come to ER.   Hospital Course     Consultants: IM at attending initially, GI, CHF, PCCM, Urology and [facility] surgery   The patient initially presented for intractable nausea and vomiting.  Abdominal ultrasound showed gallbladder sludge.  He was admitted for acute cholecystitis based on HIDA scan under internal medicine service.  General surgery saw the patient and recommended conservative therapy with antibiotic given cardiac status. 2D echo was done due to elevated BNP and trop and showed severe LV dysfunction EF < 20% with diffuse severe HK and large apical LV thrombus.  There is also RV dysfunction as well.  Patient was seen by Dr. Radford Pax and felt patient in possible cardiogenic shock given volume overload, tachycardia and hypotension.  He was transferred to [facility] from [place] and seen by advanced heart failure service and started on support.  1. Acute systolic biventricular CHF with cardiogenic shock: - ECHO with large LV thrombus, EF <20%, RV dysfunction, dilated LV, no real valve disease, + for RWMA, diastolic function not evaluated. -Cardiac catheterization 3/18 with normal coronaries - cMRI LVEF 12% RVEF 56% - also complicated by RV failure from PE - Repeat limited ECHO 04/19/20 with continued severe LV dysfunction EF <20% w/large LV thrombus, mild-mod RV dysfunction -  Continue digoxin.   - Continue spironolactone, no BP room to titrate.  - continue entresto 24/26 BID, no BP room to titrate.  - No b-blocker with low normal CO and severely reduced EF, no SGLT2i with GU issues - Continue Corlanor, HR 90s.   2. LV thrombus: - 2.3 x 3.6 cm apical thrombus - Initially on heparin. Switched to Eliquis on  04/21/20  3. 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 Also R femoral DVT -Reported chest pain on 3/17.  Chest CT was done showing central pulmonary embolism.  Evidence of right heart strain.  Critical team consulted and underwent Penumbra clot extraction 3/18 by radiology.  - Persistent minimal to no hemoptysis - limited echo 04/19/20 with persistent RV dysfunction  -Treated with heparin and then switched to NOAC  4. Hematuria -Had intermittent hematuria during admission.  Mostly asymptomatic.  Patient was seen by urology 3/22 and plan for outpatient cystoscopy per note by Dr. Louis Meckel. -seems to have resolved off heparin -Atypical urothelial cells on urine pathology.  5. Severe nausea - resolved  6. Acute blood Loss anemia - Hgb stable at 12.7.    7. Hypokalemia  - resolved  Meds for home: apixaban (PE/DVT dosing), Entresto 24/26 bid, torsemide 20 daily, spironolactone 12.5 daily, ivabradine 5 mg bid, digoxin 0.125 daily.    Did the  patient have an acute coronary syndrome (MI, NSTEMI, STEMI, etc) this admission?:  No                               Did the patient have a percutaneous coronary intervention (stent / angioplasty)?:  No.       The patient been seen by Dr. Aundra Dubin today and deemed ready for discharge home. All follow-up appointments have been scheduled. Discharge medications are listed below.   Discharge Vitals Blood pressure (!) 85/65, pulse 99, temperature 98 F (36.7 C), temperature source Oral, resp. rate (!) 22, height _0  (1.854 m), weight 122.8 kg, SpO2 96 %.  Filed Weights   04/22/20 0454  04/23/20 0350 04/24/20 0300  Weight: 121.9 kg 119.8 kg 122.8 kg    Labs & Radiologic Studies    CBC Recent Labs    04/23/20 0445 04/24/20 0037  WBC 6.4 5.6  HGB 12.8* 12.7*  HCT 40.3 39.1  MCV 79.2* 77.3*  PLT 364 947   Basic Metabolic Panel Recent Labs    04/23/20 0445 04/24/20 0037  NA 133* 134*  K 4.1 4.1  CL 97* 96*  CO2 29 29  GLUCOSE 128* 116*  BUN 13 18  CREATININE 0.93 1.16  CALCIUM 8.7* 8.7*   Liver Function Tests Recent Labs    04/23/20 0445 04/24/20 0037  AST 58* 60*  ALT 67* 72*  ALKPHOS 74 75  BILITOT 2.6* 2.4*  PROT 6.8 6.8  ALBUMIN 2.0* 2.1*   Recent Labs    04/23/20 0445 04/24/20 0037  LIPASE 111* 103*   High Sensitivity Troponin:   Recent Labs  Lab 04/11/20 1140 04/11/20 1841 04/11/20 2111 04/16/20 0406 04/16/20 1727  TROPONINIHS 42* 48* 38* 60* 98*    CT HEAD WO CONTRAST  Result Date: 04/12/2020 CLINICAL DATA:  Altered mental status. EXAM: CT HEAD WITHOUT CONTRAST TECHNIQUE: Contiguous axial images were obtained from the base of the skull through the vertex without intravenous contrast. COMPARISON:  MR head, dated April 09, 2020 FINDINGS: Brain: No evidence of acute infarction, hemorrhage, hydrocephalus, extra-axial collection or mass lesion/mass effect. Vascular: No hyperdense vessel or unexpected calcification. Skull: Normal. Negative for fracture or focal lesion. Sinuses/Orbits: No acute finding. Other: None. IMPRESSION: No acute intracranial pathology. Electronically Signed   By: Virgina Norfolk M.D.   On: 04/12/2020 17:23   CT ANGIO CHEST PE W OR WO CONTRAST  Result Date: 04/16/2020 CLINICAL DATA:  Chest pain, left ventricular thrombus on MRI, cardiac dysfunction EXAM: CT ANGIOGRAPHY CHEST WITH CONTRAST TECHNIQUE: Multidetector CT imaging of the chest was performed using the standard protocol during bolus administration of intravenous contrast. Multiplanar CT image reconstructions and MIPs were obtained to evaluate the vascular  anatomy. CONTRAST:  60m OMNIPAQUE IOHEXOL 350 MG/ML SOLN COMPARISON:  02/26/2020, 04/15/2020 FINDINGS: Cardiovascular: This is a technically adequate evaluation of the pulmonary vasculature. There are large central pulmonary emboli seen bilaterally within the main pulmonary arteries, and extending into the segmental branches left greater than right. There is severe clot burden, with evidence of right heart strain with the RV/LV ratio measuring approximately 1.2. The heart is enlarged without pericardial effusion. Normal caliber of the thoracic aorta. Mediastinum/Nodes: No enlarged mediastinal, hilar, or axillary lymph nodes. Thyroid gland, trachea, and esophagus demonstrate no significant findings. Lungs/Pleura: Hypoventilatory changes are seen bilaterally within the lungs. Small peripheral areas of wedge-shaped consolidation are seen within the right upper and right middle lobes consistent with pulmonary  infarcts. No effusion or pneumothorax. Central airways are patent. Upper Abdomen: No acute abnormality. Musculoskeletal: No acute or destructive bony lesions. Reconstructed images demonstrate no additional findings. Review of the MIP images confirms the above findings. IMPRESSION: 1. Acute bilateral central pulmonary emboli. Positive for acute PE with CT evidence of right heart strain (RV/LV Ratio = 1.2) consistent with at least submassive (intermediate risk) PE. The presence of right heart strain has been associated with an increased risk of morbidity and mortality. 2. Cardiomegaly. 3. Bilateral hypoventilatory changes, with scattered peripheral areas of consolidation concerning for developing pulmonary infarcts. Critical Value/emergent results were called by telephone at the time of interpretation on 04/16/2020 at 12:05 am to provider Dr. Ander Slade, who verbally acknowledged these results. Electronically Signed   By: Randa Ngo M.D.   On: 04/16/2020 00:11   MR Brain W and Wo Contrast  Result Date:  04/09/2020 CLINICAL DATA:  Altered mental status. EXAM: MRI HEAD WITHOUT AND WITH CONTRAST TECHNIQUE: Multiplanar, multiecho pulse sequences of the brain and surrounding structures were obtained without and with intravenous contrast. CONTRAST:  25m GADAVIST GADOBUTROL 1 MMOL/ML IV SOLN COMPARISON:  None. FINDINGS: Brain: No acute infarction, hemorrhage, hydrocephalus, extra-axial collection or mass lesion. Normal white matter. Normal enhancement postcontrast administration Image quality degraded by mild motion. Vascular: Normal arterial flow voids. Skull and upper cervical spine: No focal skeletal lesion. Sinuses/Orbits: Paranasal sinuses clear.  Negative orbit Other: None IMPRESSION: Negative MRI of the head with contrast. Mild motion degrades image quality Electronically Signed   By: CFranchot GalloM.D.   On: 04/09/2020 16:42   NM Hepatobiliary Liver Func  Result Date: 04/12/2020 CLINICAL DATA:  Evaluate for cholecystitis. Nausea and vomiting. Right upper quadrant pain. EXAM: NUCLEAR MEDICINE HEPATOBILIARY IMAGING TECHNIQUE: Sequential images of the abdomen were obtained out to 60 minutes following intravenous administration of radiopharmaceutical. RADIOPHARMACEUTICALS:  7.5 mCi Tc-936mCholetec IV COMPARISON:  Abdominal sonogram 04/11/2020 FINDINGS: Prompt uptake and biliary excretion of activity by the liver is seen. Biliary activity passes into small bowel, consistent with patent common bile duct. After 60 minutes no gallbladder activity was visualized. The patient subsequently received 3 mg of morphine, IV. Imaging was performed for an additional 30 minutes without evidence for gallbladder activity. IMPRESSION: 1. Nonvisualization of the gallbladder which is compatible with acute cholecystitis. Electronically Signed   By: TaKerby Moors.D.   On: 04/12/2020 13:00   CT Abdomen Pelvis W Contrast  Result Date: 04/08/2020 CLINICAL DATA:  Mid abdominal pain, nausea and vomiting, and diarrhea.  Unintentional 30 lb weight loss over past month. Acute pancreatitis. EXAM: CT ABDOMEN AND PELVIS WITH CONTRAST TECHNIQUE: Multidetector CT imaging of the abdomen and pelvis was performed using the standard protocol following bolus administration of intravenous contrast. CONTRAST:  12547mMNIPAQUE IOHEXOL 300 MG/ML  SOLN COMPARISON:  02/26/2020 FINDINGS: Lower Chest: No acute findings. Hepatobiliary: No hepatic masses identified. Mild diffuse hepatic steatosis again noted. Gallbladder is unremarkable. No evidence of biliary ductal dilatation. Pancreas: Previously seen swelling and adjacent soft tissue stranding involving the pancreatic tail has nearly completely resolved. Mild residual inflammatory changes are seen extending inferiorly within the right abdominal retroperitoneum. No abnormal fluid collections are identified. No evidence of pancreatic mass or ductal dilatation. Spleen: Within normal limits in size and appearance. Adrenals/Urinary Tract: No masses identified. Stable small bilateral renal cysts. No evidence of ureteral calculi or hydronephrosis. Stomach/Bowel: No evidence of obstruction, inflammatory process or abnormal fluid collections. Vascular/Lymphatic: No pathologically enlarged lymph nodes. No abdominal aortic aneurysm.  Reproductive:  No mass or other significant abnormality. Other:  None. Musculoskeletal:  No suspicious bone lesions identified. IMPRESSION: Near complete resolution of acute pancreatitis since prior exam. No evidence of pancreatic pseudocyst or other complication. Stable mild hepatic steatosis. Electronically Signed   By: Marlaine Hind M.D.   On: 04/08/2020 13:51   IR Angiogram Pulmonary Bilateral Selective  Result Date: 04/16/2020 INDICATION: 50 year old male with history of intermediate high risk (PESI class 4) acute pulmonary embolism in the setting of chronic heart failure and right lower extremity deep vein thrombosis. EXAM: 1. Ultrasound-guided left common femoral artery  access. 2. Ultrasound-guided left common femoral vein access. 3. Ultrasound-guided right common femoral vein access. 4. Pulmonary angiogram. 5. Pulmonary manometry. 6. Selective catheterization of the bilateral pulmonary arteries. 7. Mechanical thrombectomy of the bilateral pulmonary arteries. COMPARISON:  CT a chest from 04/15/2020 MEDICATIONS: None. ANESTHESIA/SEDATION: The procedure was performed under monitored anesthesia care by the Department of Anesthesia. Please refer to anesthesia record for medication administration details. FLUOROSCOPY TIME:  Fluoroscopy Time: 40 minutes 24 seconds (757 mGy). COMPLICATIONS: None immediate. TECHNIQUE: Informed written consent was obtained from the patient after a thorough discussion of the procedural risks, benefits and alternatives. All questions were addressed. Maximal Sterile Barrier Technique was utilized including caps, mask, sterile gowns, sterile gloves, sterile drape, hand hygiene and skin antiseptic. A timeout was performed prior to the initiation of the procedure. The bilateral groins were prepped and draped in standard fashion. Given propensity of clinical decompensation, request was made for vascular access in the setting of possible ECMO cannulation. Therefore, the left common femoral artery and common femoral vein were evaluated with ultrasound. Subdermal Local anesthesia was administered with 1% lidocaine at the planned needle entry sites. Small skin nicks were made with an 11 blade. Under direct ultrasound visualization, the left common femoral artery was punctured with a 21 gauge micropuncture set. A 4/5 French slender sheath was then placed, and this sheath was used for arterial monitoring during the procedure. Next, the left common femoral vein was punctured under direct ultrasound visualization. A 4/5 French slender sheath was then placed. Attention was then turned to the right groin. Ultrasound evaluation demonstrated a patent right common femoral vein  with compressibility. The procedure was planned. Subdermal Local anesthesia was administered at the planned needle entry site. A small skin nick was made. Under direct ultrasound visualization, a 21 gauge micropuncture needle was used to puncture the right common femoral vein. The micropuncture set was exchanged over a J wire for a 5 [place], 11 cm vascular sheath. The wire and a pigtail catheter were directed to the perihepatic inferior vena cava. The wire was removed and exchanged for a tip deflecting wire. Under fluoroscopic guidance, the tip deflecting wire and pigtail catheter were guided through the right atrium and right ventricle into the main pulmonary artery without difficulty. Pulmonary artery pressures were then measured (69/33, mean 45 mmHg). Pulmonary angiogram was performed which demonstrated large filling defects in the bilateral main pulmonary arteries with limited peripheral perfusion. The left pulmonary artery was then selected with an Amplatz wire. The pigtail catheter and 5 French sheath were removed. Serial dilation was performed and a 12 French, 35 cm dry seal sheath was placed. Through the sheath, the Penumbra CAT 12 thrombectomy catheter was directed into the proximal left pulmonary artery. Mechanical suction thrombectomy was performed using the separator. Multiple passes were made targeting the upper and lower lobe segmental branches. The thrombectomy catheter was removed and flushed. Repeat left-sided pulmonary angiogram  was performed with the pigtail catheter which demonstrated significantly improved clot burden and perfusion of the left lung. Attention was then turned toward the right pulmonary artery. A stiff Glidewire was used to select the right inferior lobar pulmonary artery, over which the CAT 12 thrombectomy catheter was advanced. Multiple passes performing section thrombectomy were performed using the separator. The thrombectomy catheter was removed in flush. Repeat right-sided  pulmonary angiogram was performed with the pigtail catheters demonstrated significant improved clot burden of perfusion to the right lung. The pigtail catheter was retracted into the main pulmonary artery and repeat pressures were obtained (50/33, mean 38 mmHg). Completion pulmonary angiogram demonstrated some residual bilateral lobar and segmental clot burden, however significantly improved from comparison with overall significantly improved bilateral pulmonary arterial perfusion. At this point, the patient noted to breathing easier. The catheters and sheaths were removed and manual compression was applied bilaterally without complication. The patient was transferred to the intensive care unit in stable condition. FINDINGS: Bilateral pulmonary emboli, mostly occlusive with associated pulmonary hypertension. IMPRESSION: 1. Bilateral main pulmonary emboli with associated pulmonary arterial hypertension. 2. Technically successful mechanical thrombectomy of the bilateral pulmonary arteries with reduction in pulmonary arterial hypertension. 3. Technically successful temporary left common femoral artery and common femoral vein vascular access for potential ECMO cannulation. These accesses were removed upon completion of the case as ECMO was not required. Ruthann Cancer, MD [facility] Radiology Electronically Signed   By: Ruthann Cancer MD   On: 04/16/2020 16:44   IR Angiogram Selective Each Additional Vessel  Result Date: 04/16/2020 INDICATION: 50 year old male with history of intermediate high risk (PESI class 4) acute pulmonary embolism in the setting of chronic heart failure and right lower extremity deep vein thrombosis. EXAM: 1. Ultrasound-guided left common femoral artery access. 2. Ultrasound-guided left common femoral vein access. 3. Ultrasound-guided right common femoral vein access. 4. Pulmonary angiogram. 5. Pulmonary manometry. 6. Selective catheterization of  the bilateral pulmonary arteries. 7. Mechanical thrombectomy of the bilateral pulmonary arteries. COMPARISON:  CT a chest from 04/15/2020 MEDICATIONS: None. ANESTHESIA/SEDATION: The procedure was performed under monitored anesthesia care by the Department of Anesthesia. Please refer to anesthesia record for medication administration details. FLUOROSCOPY TIME:  Fluoroscopy Time: 40 minutes 24 seconds (757 mGy). COMPLICATIONS: None immediate. TECHNIQUE: Informed written consent was obtained from the patient after a thorough discussion of the procedural risks, benefits and alternatives. All questions were addressed. Maximal Sterile Barrier Technique was utilized including caps, mask, sterile gowns, sterile gloves, sterile drape, hand hygiene and skin antiseptic. A timeout was performed prior to the initiation of the procedure. The bilateral groins were prepped and draped in standard fashion. Given propensity of clinical decompensation, request was made for vascular access in the setting of possible ECMO cannulation. Therefore, the left common femoral artery and common femoral vein were evaluated with ultrasound. Subdermal Local anesthesia was administered with 1% lidocaine at the planned needle entry sites. Small skin nicks were made with an 11 blade. Under direct ultrasound visualization, the left common femoral artery was punctured with a 21 gauge micropuncture set. A 4/5 French slender sheath was then placed, and this sheath was used for arterial monitoring during the procedure. Next, the left common femoral vein was punctured under direct ultrasound visualization. A 4/5 French slender sheath was then placed. Attention was then turned to the right groin. Ultrasound evaluation demonstrated a patent right common femoral vein with compressibility. The procedure was planned. Subdermal Local anesthesia was administered at the planned needle  entry site. A small skin nick was made. Under direct ultrasound visualization, a  21 gauge micropuncture needle was used to puncture the right common femoral vein. The micropuncture set was exchanged over a J wire for a 5 [place], 11 cm vascular sheath. The wire and a pigtail catheter were directed to the perihepatic inferior vena cava. The wire was removed and exchanged for a tip deflecting wire. Under fluoroscopic guidance, the tip deflecting wire and pigtail catheter were guided through the right atrium and right ventricle into the main pulmonary artery without difficulty. Pulmonary artery pressures were then measured (69/33, mean 45 mmHg). Pulmonary angiogram was performed which demonstrated large filling defects in the bilateral main pulmonary arteries with limited peripheral perfusion. The left pulmonary artery was then selected with an Amplatz wire. The pigtail catheter and 5 French sheath were removed. Serial dilation was performed and a 12 French, 35 cm dry seal sheath was placed. Through the sheath, the Penumbra CAT 12 thrombectomy catheter was directed into the proximal left pulmonary artery. Mechanical suction thrombectomy was performed using the separator. Multiple passes were made targeting the upper and lower lobe segmental branches. The thrombectomy catheter was removed and flushed. Repeat left-sided pulmonary angiogram was performed with the pigtail catheter which demonstrated significantly improved clot burden and perfusion of the left lung. Attention was then turned toward the right pulmonary artery. A stiff Glidewire was used to select the right inferior lobar pulmonary artery, over which the CAT 12 thrombectomy catheter was advanced. Multiple passes performing section thrombectomy were performed using the separator. The thrombectomy catheter was removed in flush. Repeat right-sided pulmonary angiogram was performed with the pigtail catheters demonstrated significant improved clot burden of perfusion to the right lung. The pigtail catheter was retracted into the main pulmonary  artery and repeat pressures were obtained (50/33, mean 38 mmHg). Completion pulmonary angiogram demonstrated some residual bilateral lobar and segmental clot burden, however significantly improved from comparison with overall significantly improved bilateral pulmonary arterial perfusion. At this point, the patient noted to breathing easier. The catheters and sheaths were removed and manual compression was applied bilaterally without complication. The patient was transferred to the intensive care unit in stable condition. FINDINGS: Bilateral pulmonary emboli, mostly occlusive with associated pulmonary hypertension. IMPRESSION: 1. Bilateral main pulmonary emboli with associated pulmonary arterial hypertension. 2. Technically successful mechanical thrombectomy of the bilateral pulmonary arteries with reduction in pulmonary arterial hypertension. 3. Technically successful temporary left common femoral artery and common femoral vein vascular access for potential ECMO cannulation. These accesses were removed upon completion of the case as ECMO was not required. Ruthann Cancer, MD [facility] Radiology Electronically Signed   By: Ruthann Cancer MD   On: 04/16/2020 16:44   IR Angiogram Selective Each Additional Vessel  Result Date: 04/16/2020 INDICATION: 50 year old male with history of intermediate high risk (PESI class 4) acute pulmonary embolism in the setting of chronic heart failure and right lower extremity deep vein thrombosis. EXAM: 1. Ultrasound-guided left common femoral artery access. 2. Ultrasound-guided left common femoral vein access. 3. Ultrasound-guided right common femoral vein access. 4. Pulmonary angiogram. 5. Pulmonary manometry. 6. Selective catheterization of the bilateral pulmonary arteries. 7. Mechanical thrombectomy of the bilateral pulmonary arteries. COMPARISON:  CT a chest from 04/15/2020 MEDICATIONS: None. ANESTHESIA/SEDATION: The procedure was  performed under monitored anesthesia care by the Department of Anesthesia. Please refer to anesthesia record for medication administration details. FLUOROSCOPY TIME:  Fluoroscopy Time: 40 minutes 24 seconds (757 mGy). COMPLICATIONS: None immediate. TECHNIQUE:  Informed written consent was obtained from the patient after a thorough discussion of the procedural risks, benefits and alternatives. All questions were addressed. Maximal Sterile Barrier Technique was utilized including caps, mask, sterile gowns, sterile gloves, sterile drape, hand hygiene and skin antiseptic. A timeout was performed prior to the initiation of the procedure. The bilateral groins were prepped and draped in standard fashion. Given propensity of clinical decompensation, request was made for vascular access in the setting of possible ECMO cannulation. Therefore, the left common femoral artery and common femoral vein were evaluated with ultrasound. Subdermal Local anesthesia was administered with 1% lidocaine at the planned needle entry sites. Small skin nicks were made with an 11 blade. Under direct ultrasound visualization, the left common femoral artery was punctured with a 21 gauge micropuncture set. A 4/5 French slender sheath was then placed, and this sheath was used for arterial monitoring during the procedure. Next, the left common femoral vein was punctured under direct ultrasound visualization. A 4/5 French slender sheath was then placed. Attention was then turned to the right groin. Ultrasound evaluation demonstrated a patent right common femoral vein with compressibility. The procedure was planned. Subdermal Local anesthesia was administered at the planned needle entry site. A small skin nick was made. Under direct ultrasound visualization, a 21 gauge micropuncture needle was used to puncture the right common femoral vein. The micropuncture set was exchanged over a J wire for a 5 [place], 11 cm vascular sheath. The wire and a pigtail  catheter were directed to the perihepatic inferior vena cava. The wire was removed and exchanged for a tip deflecting wire. Under fluoroscopic guidance, the tip deflecting wire and pigtail catheter were guided through the right atrium and right ventricle into the main pulmonary artery without difficulty. Pulmonary artery pressures were then measured (69/33, mean 45 mmHg). Pulmonary angiogram was performed which demonstrated large filling defects in the bilateral main pulmonary arteries with limited peripheral perfusion. The left pulmonary artery was then selected with an Amplatz wire. The pigtail catheter and 5 French sheath were removed. Serial dilation was performed and a 12 French, 35 cm dry seal sheath was placed. Through the sheath, the Penumbra CAT 12 thrombectomy catheter was directed into the proximal left pulmonary artery. Mechanical suction thrombectomy was performed using the separator. Multiple passes were made targeting the upper and lower lobe segmental branches. The thrombectomy catheter was removed and flushed. Repeat left-sided pulmonary angiogram was performed with the pigtail catheter which demonstrated significantly improved clot burden and perfusion of the left lung. Attention was then turned toward the right pulmonary artery. A stiff Glidewire was used to select the right inferior lobar pulmonary artery, over which the CAT 12 thrombectomy catheter was advanced. Multiple passes performing section thrombectomy were performed using the separator. The thrombectomy catheter was removed in flush. Repeat right-sided pulmonary angiogram was performed with the pigtail catheters demonstrated significant improved clot burden of perfusion to the right lung. The pigtail catheter was retracted into the main pulmonary artery and repeat pressures were obtained (50/33, mean 38 mmHg). Completion pulmonary angiogram demonstrated some residual bilateral lobar and segmental clot burden, however significantly  improved from comparison with overall significantly improved bilateral pulmonary arterial perfusion. At this point, the patient noted to breathing easier. The catheters and sheaths were removed and manual compression was applied bilaterally without complication. The patient was transferred to the intensive care unit in stable condition. FINDINGS: Bilateral pulmonary emboli, mostly occlusive with associated pulmonary hypertension. IMPRESSION: 1. Bilateral main pulmonary emboli with associated pulmonary  arterial hypertension. 2. Technically successful mechanical thrombectomy of the bilateral pulmonary arteries with reduction in pulmonary arterial hypertension. 3. Technically successful temporary left common femoral artery and common femoral vein vascular access for potential ECMO cannulation. These accesses were removed upon completion of the case as ECMO was not required. Ruthann Cancer, MD [facility] Radiology Electronically Signed   By: Ruthann Cancer MD   On: 04/16/2020 16:44   CARDIAC CATHETERIZATION  Result Date: 04/15/2020 Findings: On milrinone 0.125 mcg/kg/min Ao =125/92 (109) LV = 122/25 RA =  15 RV = 63/20 PA = 67/32 (46) PCW = 22 (v=28) Fick cardiac output/index = 5.0/2.0 PVR = 6.0 WU SVR = 1513 Ao sat = 97% PA sat = 66%, 67% PAPi = 2.33 Assessment: 1. Normal coronary arteries 2. 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 Moderate mixed pulmonary venous HTN 4. Elevated filling pressures 5. Marginal CI on milrinone Plan/Discussion: Continue medical therapy. Glori Bickers, MD 4:12 PM   IR THROMBECT PRIM MECH INIT [facility]) MOD SED  Result Date: 04/16/2020 INDICATION: 50 year old male with history of intermediate high risk (PESI class 4) acute pulmonary embolism in the setting of chronic heart failure and right lower extremity deep vein thrombosis. EXAM: 1. Ultrasound-guided left common femoral artery access. 2. Ultrasound-guided left common femoral vein access. 3.  Ultrasound-guided right common femoral vein access. 4. Pulmonary angiogram. 5. Pulmonary manometry. 6. Selective catheterization of the bilateral pulmonary arteries. 7. Mechanical thrombectomy of the bilateral pulmonary arteries. COMPARISON:  CT a chest from 04/15/2020 MEDICATIONS: None. ANESTHESIA/SEDATION: The procedure was performed under monitored anesthesia care by the Department of Anesthesia. Please refer to anesthesia record for medication administration details. FLUOROSCOPY TIME:  Fluoroscopy Time: 40 minutes 24 seconds (757 mGy). COMPLICATIONS: None immediate. TECHNIQUE: Informed written consent was obtained from the patient after a thorough discussion of the procedural risks, benefits and alternatives. All questions were addressed. Maximal Sterile Barrier Technique was utilized including caps, mask, sterile gowns, sterile gloves, sterile drape, hand hygiene and skin antiseptic. A timeout was performed prior to the initiation of the procedure. The bilateral groins were prepped and draped in standard fashion. Given propensity of clinical decompensation, request was made for vascular access in the setting of possible ECMO cannulation. Therefore, the left common femoral artery and common femoral vein were evaluated with ultrasound. Subdermal Local anesthesia was administered with 1% lidocaine at the planned needle entry sites. Small skin nicks were made with an 11 blade. Under direct ultrasound visualization, the left common femoral artery was punctured with a 21 gauge micropuncture set. A 4/5 French slender sheath was then placed, and this sheath was used for arterial monitoring during the procedure. Next, the left common femoral vein was punctured under direct ultrasound visualization. A 4/5 French slender sheath was then placed. Attention was then turned to the right groin. Ultrasound evaluation demonstrated a patent right common femoral vein with compressibility. The procedure was planned. Subdermal Local  anesthesia was administered at the planned needle entry site. A small skin nick was made. Under direct ultrasound visualization, a 21 gauge micropuncture needle was used to puncture the right common femoral vein. The micropuncture set was exchanged over a J wire for a 5 [place], 11 cm vascular sheath. The wire and a pigtail catheter were directed to the perihepatic inferior vena cava. The wire was removed and exchanged for a tip deflecting wire. Under fluoroscopic guidance, the tip deflecting wire and pigtail catheter were guided through the right atrium and right ventricle into the  main pulmonary artery without difficulty. Pulmonary artery pressures were then measured (69/33, mean 45 mmHg). Pulmonary angiogram was performed which demonstrated large filling defects in the bilateral main pulmonary arteries with limited peripheral perfusion. The left pulmonary artery was then selected with an Amplatz wire. The pigtail catheter and 5 French sheath were removed. Serial dilation was performed and a 12 French, 35 cm dry seal sheath was placed. Through the sheath, the Penumbra CAT 12 thrombectomy catheter was directed into the proximal left pulmonary artery. Mechanical suction thrombectomy was performed using the separator. Multiple passes were made targeting the upper and lower lobe segmental branches. The thrombectomy catheter was removed and flushed. Repeat left-sided pulmonary angiogram was performed with the pigtail catheter which demonstrated significantly improved clot burden and perfusion of the left lung. Attention was then turned toward the right pulmonary artery. A stiff Glidewire was used to select the right inferior lobar pulmonary artery, over which the CAT 12 thrombectomy catheter was advanced. Multiple passes performing section thrombectomy were performed using the separator. The thrombectomy catheter was removed in flush. Repeat right-sided pulmonary angiogram was performed with the pigtail catheters  demonstrated significant improved clot burden of perfusion to the right lung. The pigtail catheter was retracted into the main pulmonary artery and repeat pressures were obtained (50/33, mean 38 mmHg). Completion pulmonary angiogram demonstrated some residual bilateral lobar and segmental clot burden, however significantly improved from comparison with overall significantly improved bilateral pulmonary arterial perfusion. At this point, the patient noted to breathing easier. The catheters and sheaths were removed and manual compression was applied bilaterally without complication. The patient was transferred to the intensive care unit in stable condition. FINDINGS: Bilateral pulmonary emboli, mostly occlusive with associated pulmonary hypertension. IMPRESSION: 1. Bilateral main pulmonary emboli with associated pulmonary arterial hypertension. 2. Technically successful mechanical thrombectomy of the bilateral pulmonary arteries with reduction in pulmonary arterial hypertension. 3. Technically successful temporary left common femoral artery and common femoral vein vascular access for potential ECMO cannulation. These accesses were removed upon completion of the case as ECMO was not required. Ruthann Cancer, MD [facility] Radiology Electronically Signed   By: Ruthann Cancer MD   On: 04/16/2020 16:44   IR US Guide Vasc Access Left  Result Date: 04/16/2020 INDICATION: 50 year old male with history of intermediate high risk (PESI class 4) acute pulmonary embolism in the setting of chronic heart failure and right lower extremity deep vein thrombosis. EXAM: 1. Ultrasound-guided left common femoral artery access. 2. Ultrasound-guided left common femoral vein access. 3. Ultrasound-guided right common femoral vein access. 4. Pulmonary angiogram. 5. Pulmonary manometry. 6. Selective catheterization of the bilateral pulmonary arteries. 7. Mechanical thrombectomy of the bilateral  pulmonary arteries. COMPARISON:  CT a chest from 04/15/2020 MEDICATIONS: None. ANESTHESIA/SEDATION: The procedure was performed under monitored anesthesia care by the Department of Anesthesia. Please refer to anesthesia record for medication administration details. FLUOROSCOPY TIME:  Fluoroscopy Time: 40 minutes 24 seconds (757 mGy). COMPLICATIONS: None immediate. TECHNIQUE: Informed written consent was obtained from the patient after a thorough discussion of the procedural risks, benefits and alternatives. All questions were addressed. Maximal Sterile Barrier Technique was utilized including caps, mask, sterile gowns, sterile gloves, sterile drape, hand hygiene and skin antiseptic. A timeout was performed prior to the initiation of the procedure. The bilateral groins were prepped and draped in standard fashion. Given propensity of clinical decompensation, request was made for vascular access in the setting of possible ECMO cannulation. Therefore, the left common femoral artery and common  femoral vein were evaluated with ultrasound. Subdermal Local anesthesia was administered with 1% lidocaine at the planned needle entry sites. Small skin nicks were made with an 11 blade. Under direct ultrasound visualization, the left common femoral artery was punctured with a 21 gauge micropuncture set. A 4/5 French slender sheath was then placed, and this sheath was used for arterial monitoring during the procedure. Next, the left common femoral vein was punctured under direct ultrasound visualization. A 4/5 French slender sheath was then placed. Attention was then turned to the right groin. Ultrasound evaluation demonstrated a patent right common femoral vein with compressibility. The procedure was planned. Subdermal Local anesthesia was administered at the planned needle entry site. A small skin nick was made. Under direct ultrasound visualization, a 21 gauge micropuncture needle was used to puncture the right common femoral  vein. The micropuncture set was exchanged over a J wire for a 5 [place], 11 cm vascular sheath. The wire and a pigtail catheter were directed to the perihepatic inferior vena cava. The wire was removed and exchanged for a tip deflecting wire. Under fluoroscopic guidance, the tip deflecting wire and pigtail catheter were guided through the right atrium and right ventricle into the main pulmonary artery without difficulty. Pulmonary artery pressures were then measured (69/33, mean 45 mmHg). Pulmonary angiogram was performed which demonstrated large filling defects in the bilateral main pulmonary arteries with limited peripheral perfusion. The left pulmonary artery was then selected with an Amplatz wire. The pigtail catheter and 5 French sheath were removed. Serial dilation was performed and a 12 French, 35 cm dry seal sheath was placed. Through the sheath, the Penumbra CAT 12 thrombectomy catheter was directed into the proximal left pulmonary artery. Mechanical suction thrombectomy was performed using the separator. Multiple passes were made targeting the upper and lower lobe segmental branches. The thrombectomy catheter was removed and flushed. Repeat left-sided pulmonary angiogram was performed with the pigtail catheter which demonstrated significantly improved clot burden and perfusion of the left lung. Attention was then turned toward the right pulmonary artery. A stiff Glidewire was used to select the right inferior lobar pulmonary artery, over which the CAT 12 thrombectomy catheter was advanced. Multiple passes performing section thrombectomy were performed using the separator. The thrombectomy catheter was removed in flush. Repeat right-sided pulmonary angiogram was performed with the pigtail catheters demonstrated significant improved clot burden of perfusion to the right lung. The pigtail catheter was retracted into the main pulmonary artery and repeat pressures were obtained (50/33, mean 38 mmHg). Completion  pulmonary angiogram demonstrated some residual bilateral lobar and segmental clot burden, however significantly improved from comparison with overall significantly improved bilateral pulmonary arterial perfusion. At this point, the patient noted to breathing easier. The catheters and sheaths were removed and manual compression was applied bilaterally without complication. The patient was transferred to the intensive care unit in stable condition. FINDINGS: Bilateral pulmonary emboli, mostly occlusive with associated pulmonary hypertension. IMPRESSION: 1. Bilateral main pulmonary emboli with associated pulmonary arterial hypertension. 2. Technically successful mechanical thrombectomy of the bilateral pulmonary arteries with reduction in pulmonary arterial hypertension. 3. Technically successful temporary left common femoral artery and common femoral vein vascular access for potential ECMO cannulation. These accesses were removed upon completion of the case as ECMO was not required. Ruthann Cancer, MD [facility] Radiology Electronically Signed   By: Ruthann Cancer MD   On: 04/16/2020 16:44   IR US Guide Vasc Access Left  Result Date: 04/16/2020 INDICATION: 50 year old male with  history of intermediate high risk (PESI class 4) acute pulmonary embolism in the setting of chronic heart failure and right lower extremity deep vein thrombosis. EXAM: 1. Ultrasound-guided left common femoral artery access. 2. Ultrasound-guided left common femoral vein access. 3. Ultrasound-guided right common femoral vein access. 4. Pulmonary angiogram. 5. Pulmonary manometry. 6. Selective catheterization of the bilateral pulmonary arteries. 7. Mechanical thrombectomy of the bilateral pulmonary arteries. COMPARISON:  CT a chest from 04/15/2020 MEDICATIONS: None. ANESTHESIA/SEDATION: The procedure was performed under monitored anesthesia care by the Department of Anesthesia. Please refer to  anesthesia record for medication administration details. FLUOROSCOPY TIME:  Fluoroscopy Time: 40 minutes 24 seconds (757 mGy). COMPLICATIONS: None immediate. TECHNIQUE: Informed written consent was obtained from the patient after a thorough discussion of the procedural risks, benefits and alternatives. All questions were addressed. Maximal Sterile Barrier Technique was utilized including caps, mask, sterile gowns, sterile gloves, sterile drape, hand hygiene and skin antiseptic. A timeout was performed prior to the initiation of the procedure. The bilateral groins were prepped and draped in standard fashion. Given propensity of clinical decompensation, request was made for vascular access in the setting of possible ECMO cannulation. Therefore, the left common femoral artery and common femoral vein were evaluated with ultrasound. Subdermal Local anesthesia was administered with 1% lidocaine at the planned needle entry sites. Small skin nicks were made with an 11 blade. Under direct ultrasound visualization, the left common femoral artery was punctured with a 21 gauge micropuncture set. A 4/5 French slender sheath was then placed, and this sheath was used for arterial monitoring during the procedure. Next, the left common femoral vein was punctured under direct ultrasound visualization. A 4/5 French slender sheath was then placed. Attention was then turned to the right groin. Ultrasound evaluation demonstrated a patent right common femoral vein with compressibility. The procedure was planned. Subdermal Local anesthesia was administered at the planned needle entry site. A small skin nick was made. Under direct ultrasound visualization, a 21 gauge micropuncture needle was used to puncture the right common femoral vein. The micropuncture set was exchanged over a J wire for a 5 [place], 11 cm vascular sheath. The wire and a pigtail catheter were directed to the perihepatic inferior vena cava. The wire was removed and  exchanged for a tip deflecting wire. Under fluoroscopic guidance, the tip deflecting wire and pigtail catheter were guided through the right atrium and right ventricle into the main pulmonary artery without difficulty. Pulmonary artery pressures were then measured (69/33, mean 45 mmHg). Pulmonary angiogram was performed which demonstrated large filling defects in the bilateral main pulmonary arteries with limited peripheral perfusion. The left pulmonary artery was then selected with an Amplatz wire. The pigtail catheter and 5 French sheath were removed. Serial dilation was performed and a 12 French, 35 cm dry seal sheath was placed. Through the sheath, the Penumbra CAT 12 thrombectomy catheter was directed into the proximal left pulmonary artery. Mechanical suction thrombectomy was performed using the separator. Multiple passes were made targeting the upper and lower lobe segmental branches. The thrombectomy catheter was removed and flushed. Repeat left-sided pulmonary angiogram was performed with the pigtail catheter which demonstrated significantly improved clot burden and perfusion of the left lung. Attention was then turned toward the right pulmonary artery. A stiff Glidewire was used to select the right inferior lobar pulmonary artery, over which the CAT 12 thrombectomy catheter was advanced. Multiple passes performing section thrombectomy were performed using the separator. The thrombectomy catheter was removed in flush. Repeat right-sided pulmonary  angiogram was performed with the pigtail catheters demonstrated significant improved clot burden of perfusion to the right lung. The pigtail catheter was retracted into the main pulmonary artery and repeat pressures were obtained (50/33, mean 38 mmHg). Completion pulmonary angiogram demonstrated some residual bilateral lobar and segmental clot burden, however significantly improved from comparison with overall significantly improved bilateral pulmonary arterial  perfusion. At this point, the patient noted to breathing easier. The catheters and sheaths were removed and manual compression was applied bilaterally without complication. The patient was transferred to the intensive care unit in stable condition. FINDINGS: Bilateral pulmonary emboli, mostly occlusive with associated pulmonary hypertension. IMPRESSION: 1. Bilateral main pulmonary emboli with associated pulmonary arterial hypertension. 2. Technically successful mechanical thrombectomy of the bilateral pulmonary arteries with reduction in pulmonary arterial hypertension. 3. Technically successful temporary left common femoral artery and common femoral vein vascular access for potential ECMO cannulation. These accesses were removed upon completion of the case as ECMO was not required. Ruthann Cancer, MD [facility] Radiology Electronically Signed   By: Ruthann Cancer MD   On: 04/16/2020 16:44   IR US Guide Vasc Access Right  Result Date: 04/16/2020 INDICATION: 50 year old male with history of intermediate high risk (PESI class 4) acute pulmonary embolism in the setting of chronic heart failure and right lower extremity deep vein thrombosis. EXAM: 1. Ultrasound-guided left common femoral artery access. 2. Ultrasound-guided left common femoral vein access. 3. Ultrasound-guided right common femoral vein access. 4. Pulmonary angiogram. 5. Pulmonary manometry. 6. Selective catheterization of the bilateral pulmonary arteries. 7. Mechanical thrombectomy of the bilateral pulmonary arteries. COMPARISON:  CT a chest from 04/15/2020 MEDICATIONS: None. ANESTHESIA/SEDATION: The procedure was performed under monitored anesthesia care by the Department of Anesthesia. Please refer to anesthesia record for medication administration details. FLUOROSCOPY TIME:  Fluoroscopy Time: 40 minutes 24 seconds (757 mGy). COMPLICATIONS: None immediate. TECHNIQUE: Informed written consent was  obtained from the patient after a thorough discussion of the procedural risks, benefits and alternatives. All questions were addressed. Maximal Sterile Barrier Technique was utilized including caps, mask, sterile gowns, sterile gloves, sterile drape, hand hygiene and skin antiseptic. A timeout was performed prior to the initiation of the procedure. The bilateral groins were prepped and draped in standard fashion. Given propensity of clinical decompensation, request was made for vascular access in the setting of possible ECMO cannulation. Therefore, the left common femoral artery and common femoral vein were evaluated with ultrasound. Subdermal Local anesthesia was administered with 1% lidocaine at the planned needle entry sites. Small skin nicks were made with an 11 blade. Under direct ultrasound visualization, the left common femoral artery was punctured with a 21 gauge micropuncture set. A 4/5 French slender sheath was then placed, and this sheath was used for arterial monitoring during the procedure. Next, the left common femoral vein was punctured under direct ultrasound visualization. A 4/5 French slender sheath was then placed. Attention was then turned to the right groin. Ultrasound evaluation demonstrated a patent right common femoral vein with compressibility. The procedure was planned. Subdermal Local anesthesia was administered at the planned needle entry site. A small skin nick was made. Under direct ultrasound visualization, a 21 gauge micropuncture needle was used to puncture the right common femoral vein. The micropuncture set was exchanged over a J wire for a 5 [place], 11 cm vascular sheath. The wire and a pigtail catheter were directed to the perihepatic inferior vena cava. The wire was removed and exchanged for a tip deflecting wire. Under  fluoroscopic guidance, the tip deflecting wire and pigtail catheter were guided through the right atrium and right ventricle into the main pulmonary artery  without difficulty. Pulmonary artery pressures were then measured (69/33, mean 45 mmHg). Pulmonary angiogram was performed which demonstrated large filling defects in the bilateral main pulmonary arteries with limited peripheral perfusion. The left pulmonary artery was then selected with an Amplatz wire. The pigtail catheter and 5 French sheath were removed. Serial dilation was performed and a 12 French, 35 cm dry seal sheath was placed. Through the sheath, the Penumbra CAT 12 thrombectomy catheter was directed into the proximal left pulmonary artery. Mechanical suction thrombectomy was performed using the separator. Multiple passes were made targeting the upper and lower lobe segmental branches. The thrombectomy catheter was removed and flushed. Repeat left-sided pulmonary angiogram was performed with the pigtail catheter which demonstrated significantly improved clot burden and perfusion of the left lung. Attention was then turned toward the right pulmonary artery. A stiff Glidewire was used to select the right inferior lobar pulmonary artery, over which the CAT 12 thrombectomy catheter was advanced. Multiple passes performing section thrombectomy were performed using the separator. The thrombectomy catheter was removed in flush. Repeat right-sided pulmonary angiogram was performed with the pigtail catheters demonstrated significant improved clot burden of perfusion to the right lung. The pigtail catheter was retracted into the main pulmonary artery and repeat pressures were obtained (50/33, mean 38 mmHg). Completion pulmonary angiogram demonstrated some residual bilateral lobar and segmental clot burden, however significantly improved from comparison with overall significantly improved bilateral pulmonary arterial perfusion. At this point, the patient noted to breathing easier. The catheters and sheaths were removed and manual compression was applied bilaterally without complication. The patient was transferred  to the intensive care unit in stable condition. FINDINGS: Bilateral pulmonary emboli, mostly occlusive with associated pulmonary hypertension. IMPRESSION: 1. Bilateral main pulmonary emboli with associated pulmonary arterial hypertension. 2. Technically successful mechanical thrombectomy of the bilateral pulmonary arteries with reduction in pulmonary arterial hypertension. 3. Technically successful temporary left common femoral artery and common femoral vein vascular access for potential ECMO cannulation. These accesses were removed upon completion of the case as ECMO was not required. Ruthann Cancer, MD [facility] Radiology Electronically Signed   By: Ruthann Cancer MD   On: 04/16/2020 16:44   DG CHEST PORT 1 VIEW  Result Date: 04/16/2020 CLINICAL DATA:  Patient diagnosed with pulmonary embolus 04/15/2020. Cardiac dysfunction. EXAM: PORTABLE CHEST 1 VIEW COMPARISON:  Single-view of the chest and CT chest 04/15/2020. FINDINGS: The patient's right PICC projects just within the right atrium. Mild left basilar atelectasis is seen. Lungs otherwise clear. Cardiomegaly. No pneumothorax or pleural fluid. IMPRESSION: Left basilar atelectasis. Cardiomegaly without edema. Electronically Signed   By: Inge Rise M.D.   On: 04/16/2020 18:53   DG CHEST PORT 1 VIEW  Result Date: 04/15/2020 CLINICAL DATA:  Chest pain at rest. EXAM: PORTABLE CHEST 1 VIEW COMPARISON:  Radiograph 04/12/2020 FINDINGS: Right upper extremity PICC tip at the atrial caval junction. Lower lung volumes from prior exam leading to bronchovascular crowding. Cardiomegaly. No convincing pulmonary edema or pleural effusion. No pneumothorax. Elevation of right hemidiaphragm. IMPRESSION: Cardiomegaly. Lower lung volumes from prior exam leading to bronchovascular crowding. Electronically Signed   By: Keith Rake M.D.   On: 04/15/2020 18:35   DG Chest Port 1 View  Result Date: 04/12/2020 CLINICAL  DATA:  Evaluate for congestive heart failure. EXAM: PORTABLE CHEST 1 VIEW COMPARISON:  April 01, 2020  FINDINGS: The cardiac silhouette is markedly enlarged and unchanged in size. Both lungs are clear. The visualized skeletal structures are unremarkable. IMPRESSION: 1. Cardiomegaly without acute or active cardiopulmonary disease. Electronically Signed   By: Virgina Norfolk M.D.   On: 04/12/2020 15:33   DG Chest Port 1 View  Result Date: 04/01/2020 CLINICAL DATA:  Epigastric pain. EXAM: PORTABLE CHEST 1 VIEW COMPARISON:  February 26, 2020 FINDINGS: The lung volumes are low. There is cardiomegaly with findings of vascular congestion. No pneumothorax or large pleural effusion. No acute osseous abnormality. Evaluation is limited by patient body habitus. IMPRESSION: Cardiomegaly with findings of vascular congestion. Electronically Signed   By: Constance Holster M.D.   On: 04/01/2020 02:29   DG Abd Portable 1V  Result Date: 04/18/2020 CLINICAL DATA:  Intractable nausea vomiting. EXAM: PORTABLE ABDOMEN - 1 VIEW COMPARISON:  CT abdomen pelvis April 08, 2020 FINDINGS: The bowel gas pattern is normal. No radio-opaque calculi or other significant radiographic abnormality are seen. Patchy opacity of medial left lung base is identified. IMPRESSION: No bowel obstruction. Electronically Signed   By: Abelardo Diesel M.D.   On: 04/18/2020 12:04   MR CARDIAC MORPHOLOGY W WO CONTRAST  Result Date: 04/15/2020 CLINICAL DATA:  Clinical question of cardiomyopathy and left ventricular mass 50 year old Male Study assumes HCT of 19 EXAM: CARDIAC MRI TECHNIQUE: The patient was scanned on a 1.5 Tesla GE magnet. A dedicated cardiac coil was used. Functional imaging was done using Fiesta sequences. 2,3, and 4 chamber views were done to assess for RWMA's. Modified Simpson's rule using a short axis stack was used to calculate an ejection fraction on a dedicated work Conservation officer, nature. The patient received 10 cc of Gadavist.  After 10 minutes inversion recovery sequences were used to assess for infiltration and scar tissue. CONTRAST:  10 cc  of Gadavist FINDINGS: 1. Severe left ventricular dilation, with LVEDD 71 mm, and LVEDVi 130 mL/m2. Normal left ventricular thickness, with intraventricular septal thickness of 7 mm, posterior wall thickness of 5 mm. Severe left ventricular systolic dysfunction (LVEF =12%). There is severe global hypokinesis with apical dyskinesis. Left ventricular parametric mapping notable for diffuse increase in ECV signal in the apex (30-40%). Normal T2 signal. There is no late gadolinium enhancement in the left ventricular myocardium. There is a density 26 mm X 23 mm X 10.5 mm in the left ventricular apex that does not perfuse with contrast consistent with LV thrombus. 2. Severe right ventricular dilation with RVEDVI 104 mL/m2. Normal right ventricular thickness. Severe right ventricular systolic dysfunction (RVEF =21 %). Severe global ventricular hypokinesis with apical akinesis. 3.  Normal size of the left and moderate right atrial dilation: 4. Normal size of the aortic root, ascending aorta. Pulmonary artery is mildly dilated at 40 mm. 5. Tricuspid regurgitation and mitral regurgitation is visualized qualitatively. 6.  Normal pericardium.  No pericardial effusion. 7. Grossly, no extracardiac findings. Recommended dedicated study if concerned for non-cardiac pathology. IMPRESSION: 1.  Severe left ventricular dilation. 2. Severe left ventricular systolic dysfunction (LVEF =12%). There is severe global hypokinesis with apical dyskinesis. 3.  Diffuse increase in ECV signal in the apex (30-40%). 4.  There is likely an LV thrombus. 5.  Severe right ventricular dilation. 6. Severe right ventricular systolic dysfunction (RVEF =21 %). Severe global ventricular hypokinesis with apical akinesis. 7.  Moderate right atrial dilation 8.  Pulmonary artery is mildly dilated at 40 mm 9. Tricuspid regurgitation and mitral  regurgitation is visualized qualitatively. 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 Exam Location:  Inpatient Procedure: 2D Echo, Cardiac Doppler, Color Doppler and Intracardiac            Opacification Agent                       STAT ECHO Reported to: Dr Mertie Moores on 04/12/2020 2:13:00 PM. Indications:    Elevated Troponin  History:        Patient has no prior history of Echocardiogram examinations.                 Risk Factors:Hypertension and GERD.  Sonographer:    Jonelle Sidle Dance Referring Phys: [id number] [place] IMPRESSIONS  1. There is a large apical thromus measuring 3.6 x 2.3 cm .     Marland Kitchen Left ventricular ejection fraction, by estimation, is <20%. The left ventricle has severely decreased function. The left ventricle demonstrates regional wall motion abnormalities (see scoring diagram/findings for description). The left ventricular internal cavity size was moderately to severely dilated. Left ventricular diastolic function could not be evaluated. There is severe akinesis of the left ventricular, mid-apical apical segment, anteroseptal wall, inferoseptal wall and lateral wall.  2. Right ventricular systolic function was not well visualized. The right ventricular size is not well visualized.  3. The mitral valve is grossly normal. No evidence of mitral valve regurgitation.  4. The aortic valve is normal in structure. Aortic valve regurgitation is not  visualized. No aortic stenosis is present.  5. Aortic dilatation noted. There is borderline dilatation of the ascending aorta, measuring 39 mm. FINDINGS  Left Ventricle: There is a large apical thromus measuring 3.6 x 2.3 cm. Left ventricular ejection fraction, by estimation, is <20%. The left ventricle has severely decreased function. The left ventricle demonstrates regional wall motion abnormalities. Severe akinesis of the left ventricular, mid-apical apical segment, anteroseptal wall, inferoseptal wall and lateral wall. Definity contrast agent was given IV to delineate the left ventricular endocardial borders. The left ventricular internal cavity size was moderately to severely dilated. There is no left ventricular hypertrophy. Left ventricular diastolic function could not be evaluated. Right Ventricle: The right ventricular size is not well visualized. Right vetricular wall thickness was not well visualized. Right ventricular systolic function was not well visualized. Left Atrium: Left atrial size was not well visualized. Right Atrium: Right atrial size was not well visualized. Pericardium: There is no evidence of pericardial effusion. Mitral Valve:  The mitral valve is grossly normal. No evidence of mitral valve regurgitation. Tricuspid Valve: The tricuspid valve is normal in structure. Tricuspid valve regurgitation is mild. Aortic Valve: The aortic valve is normal in structure. Aortic valve regurgitation is not visualized. No aortic stenosis is present. Pulmonic Valve: The pulmonic valve was normal in structure. Pulmonic valve regurgitation is not visualized. Aorta: Aortic dilatation noted. There is borderline dilatation of the ascending aorta, measuring 39 mm. IAS/Shunts: The atrial septum is grossly normal.  LEFT VENTRICLE PLAX 2D LVIDd:         6.23 cm  Diastology LVIDs:         5.59 cm  LV e' medial:    5.84 cm/s LV PW:         1.38 cm  LV E/e' medial:  14.0 LV IVS:        0.94 cm  LV e' lateral:   6.48  cm/s LVOT diam:     2.30 cm  LV E/e' lateral: 12.6 LV SV:         23 LV SV Index:   9 LVOT Area:     4.15 cm  RIGHT VENTRICLE             IVC RV Basal diam:  3.49 cm     IVC diam: 2.50 cm RV Mid diam:    2.29 cm RV S prime:     10.30 cm/s TAPSE (M-mode): 1.8 cm LEFT ATRIUM         Index LA diam:    4.60 cm 1.80 cm/m  AORTIC VALVE LVOT Vmax:   46.50 cm/s LVOT Vmean:  30.100 cm/s LVOT VTI:    0.055 m  AORTA Ao Root diam: 3.40 cm Ao Asc diam:  3.90 cm MITRAL VALVE MV Area (PHT): 4.70 cm    SHUNTS MV Decel Time: 162 msec    Systemic VTI:  0.05 m MV E velocity: 81.70 cm/s  Systemic Diam: 2.30 cm MV A velocity: 30.60 cm/s MV E/A ratio:  2.67 Mertie Moores MD Electronically signed by Mertie Moores MD Signature Date/Time: 04/12/2020/2:24:59 PM    Final    VAS [place] LOWER EXTREMITY VENOUS (DVT)  Result Date: 04/13/2020  Lower Venous DVT Study Indications: Swelling.  Risk Factors: None identified. Limitations: Body habitus and poor ultrasound/tissue interface. Comparison Study: No prior studies. Performing Technologist: Oliver Hum RVT  Examination Guidelines: A complete evaluation includes B-mode imaging, spectral Doppler, color Doppler, and power Doppler as needed of all accessible portions of each vessel. Bilateral testing is considered an integral part of a complete examination. Limited examinations for reoccurring indications may be performed as noted. The reflux portion of the exam is performed with the patient in reverse Trendelenburg.  +---------+---------------+---------+-----------+----------+-------------------+ RIGHT    CompressibilityPhasicitySpontaneityPropertiesThrombus Aging      +---------+---------------+---------+-----------+----------+-------------------+ CFV      Full           No       No                                       +---------+---------------+---------+-----------+----------+-------------------+ SFJ      Full                                                              +---------+---------------+---------+-----------+----------+-------------------+  FV Prox  Full                                                             +---------+---------------+---------+-----------+----------+-------------------+ FV Mid   None           No       No                   Acute               +---------+---------------+---------+-----------+----------+-------------------+ FV DistalNone           No       No                   Acute               +---------+---------------+---------+-----------+----------+-------------------+ PFV      Full                                                             +---------+---------------+---------+-----------+----------+-------------------+ POP      Full           Yes      Yes                                      +---------+---------------+---------+-----------+----------+-------------------+ PTV      Full                                                             +---------+---------------+---------+-----------+----------+-------------------+ PERO                                                  Not well visualized +---------+---------------+---------+-----------+----------+-------------------+   +---------+---------------+---------+-----------+----------+--------------+ LEFT     CompressibilityPhasicitySpontaneityPropertiesThrombus Aging +---------+---------------+---------+-----------+----------+--------------+ CFV      Full           Yes      Yes                                 +---------+---------------+---------+-----------+----------+--------------+ SFJ      Full                                                        +---------+---------------+---------+-----------+----------+--------------+ FV Prox  Full                                                        +---------+---------------+---------+-----------+----------+--------------+  FV Mid   Full                                                         +---------+---------------+---------+-----------+----------+--------------+ FV DistalFull                                                        +---------+---------------+---------+-----------+----------+--------------+ PFV      Full                                                        +---------+---------------+---------+-----------+----------+--------------+ POP      Full           Yes      Yes                                 +---------+---------------+---------+-----------+----------+--------------+ PTV      Full                                                        +---------+---------------+---------+-----------+----------+--------------+ PERO     Full                                                        +---------+---------------+---------+-----------+----------+--------------+     Summary: RIGHT: - Findings consistent with acute deep vein thrombosis involving the right femoral vein. - No cystic structure found in the popliteal fossa.  LEFT: - There is no evidence of deep vein thrombosis in the lower extremity. However, portions of this examination were limited- see technologist comments above.  - No cystic structure found in the popliteal fossa.  *See table(s) above for measurements and observations. Electronically signed by Deitra Mayo MD on 04/13/2020 at 12:36:42 PM.    Final    ECHOCARDIOGRAM LIMITED  Result Date: 04/19/2020    ECHOCARDIOGRAM LIMITED REPORT   Patient Name:   ELEANOR DIMICHELE III Date of Exam: 04/19/2020 Medical Rec #:  [id number]          Height:       73.0 in Accession #:    [id number]         Weight:       265.2 lb Date of Birth:  21-Nov-1970         BSA:          2.426 m Patient Age:    16 years           BP: Patient Gender: M                  HR: Exam Location:  Inpatient Procedure: 2D Echo Indications:  CHF; I42.9 Cardiomyopathy (unspecified)  History:        Patient has prior history of Echocardiogram  examinations.  Sonographer:    Luisa Hart RDCS Referring Phys: [id number] DANIEL R BENSIMHON IMPRESSIONS  1. Large echodensity at LV apex consistent with mural thrombus Measures 35 x 22 mm. Not significantly changed from echo done on 04/12/20.Marland Kitchen Left ventricular ejection fraction, by estimation, is <20%. The left ventricle has severely decreased function. The  left ventricle demonstrates global hypokinesis. The left ventricular internal cavity size was severely dilated.  2. Right ventricular systolic function is mildly reduced. The right ventricular size is mildly enlarged.  3. Trivial mitral valve regurgitation.  4. The inferior vena cava is normal in size with <50% respiratory variability, suggesting right atrial pressure of 8 mmHg. Comparison(s): The left ventricular function is unchanged. FINDINGS  Left Ventricle: Large echodensity at LV apex consistent with mural thrombus Measures 35 x 22 mm. Not significantly changed from echo done on 04/12/20. Left ventricular ejection fraction, by estimation, is <20%. The left ventricle has severely decreased function. The left ventricle demonstrates global hypokinesis. The left ventricular internal cavity size was severely dilated. There is no left ventricular hypertrophy. Right Ventricle: The right ventricular size is mildly enlarged. Right ventricular systolic function is mildly reduced. Left Atrium: Left atrial size was normal in size. Right Atrium: Right atrial size was normal in size. Pericardium: There is no evidence of pericardial effusion. Mitral Valve: There is mild thickening of the mitral valve leaflet(s). Trivial mitral valve regurgitation. Tricuspid Valve: The tricuspid valve is normal in structure. Tricuspid valve regurgitation is mild. Pulmonic Valve: The pulmonic valve was normal in structure. Pulmonic valve regurgitation is mild. Aorta: The aortic root and ascending aorta are structurally normal, with no evidence of dilitation. Venous: The inferior vena cava is  normal in size with less than 50% respiratory variability, suggesting right atrial pressure of 8 mmHg. LEFT VENTRICLE PLAX 2D LVIDd:         6.20 cm  Diastology LVIDs:         5.50 cm  LV e' medial:    2.83 cm/s LV PW:         1.10 cm  LV E/e' medial:  7.0 LV IVS:        0.90 cm  LV e' lateral:   4.03 cm/s LVOT diam:     2.60 cm  LV E/e' lateral: 4.9 LVOT Area:     5.31 cm  RIGHT VENTRICLE RV S prime:     14.00 cm/s TAPSE (M-mode): 1.7 cm LEFT ATRIUM             Index       RIGHT ATRIUM           Index LA diam:        3.70 cm 1.53 cm/m  RA Area:     20.40 cm LA Vol (A2C):   58.2 ml 23.99 ml/m RA Volume:   52.80 ml  21.77 ml/m LA Vol (A4C):   56.4 ml 23.25 ml/m LA Biplane Vol: 59.4 ml 24.49 ml/m                        PULMONIC VALVE AORTA                 PV Vmax:       0.76 m/s Ao Root diam: 3.70 cm PV Vmean:      49.400 cm/s Ao Asc diam:  3.50 cm PV  VTI:        0.100 m                       PV Peak grad:  2.3 mmHg                       PV Mean grad:  1.0 mmHg  MITRAL VALVE               TRICUSPID VALVE MV Area (PHT): 5.34 cm    TR Peak grad:   42.5 mmHg MV Decel Time: 142 msec    TR Vmax:        326.00 cm/s MV E velocity: 19.70 cm/s MV A velocity: 90.80 cm/s  SHUNTS MV E/A ratio:  0.22        Systemic Diam: 2.60 cm Dorris Carnes MD Electronically signed by Dorris Carnes MD Signature Date/Time: 04/19/2020/4:13:48 PM    Final    [place] EKG SITE RITE  Result Date: 04/12/2020 If Site Rite image not attached, placement could not be confirmed due to current cardiac rhythm.  US Abdomen Limited RUQ (LIVER/GB)  Result Date: 04/11/2020 CLINICAL DATA:  Pain with nausea and hemoptysis EXAM: ULTRASOUND ABDOMEN LIMITED RIGHT UPPER QUADRANT COMPARISON:  CT abdomen and pelvis April 08, 2020 FINDINGS: Gallbladder: There is sludge in the gallbladder. No gallstones are evident. Gallbladder wall is thickened. There is no appreciable pericholecystic fluid. No sonographic Murphy sign noted by sonographer. Common bile duct: Diameter:  4 mm. No intrahepatic or extrahepatic biliary duct dilatation evident. Liver: No focal lesion identified. Within normal limits in parenchymal echogenicity. Portal vein is patent on color Doppler imaging with normal direction of blood flow towards the liver. Other: There is a cyst in the upper pole the right kidney measuring 1.9 x 1.7 x 1.5 cm. IMPRESSION: 1. No gallstones evident. There is sludge in the gallbladder with gallbladder wall thickening. Question a degree of acalculus cholecystitis. This finding may warrant nuclear medicine hepatobiliary imaging study to assess for cystic duct patency. 2. Cyst arising from upper pole right kidney measuring 1.9 x 1.7 x 1.5 cm. 3.  Study otherwise unremarkable. Electronically Signed   By: Lowella Grip III M.D.   On: 04/11/2020 14:13   Disposition   Pt is being discharged home today in good condition.  Follow-up Plans & Appointments     Follow-up Information    Ardis Hughs, MD In 2 weeks.   Specialty: Urology Why: hospital f/u, cystoscopy Contact information: [place] [address] [phone]        [facility] Follow up on 05/13/2020.   Specialty: Cardiology Why: at [place] information: [address] [id number] [place] [address] [phone]       Sueanne Margarita, MD. Go on 05/05/2020.   Specialty: Cardiology Why: _0 :40am  Contact information: [address] [place] [address] [phone]              Discharge Instructions    Amb Referral to Cardiac Rehabilitation   Complete by: As directed    Diagnosis: Heart Failure (see criteria below if ordering Phase II)   Heart Failure Type: Chronic Systolic   After initial evaluation and assessments completed: Virtual Based Care may be provided alone or in conjunction with Phase 2 Cardiac Rehab based on patient barriers.: Yes   Diet - low sodium heart  healthy  Complete by: As directed    Increase activity slowly   Complete by: As directed       Discharge Medications   Allergies as of 04/24/2020      Reactions   Pseudoephedrine    REACTION: jittery      Medication List    STOP taking these medications   losartan 50 MG tablet Commonly known as: COZAAR     TAKE these medications   allopurinol 300 MG tablet Commonly known as: ZYLOPRIM Take 1 tablet (300 mg total) by mouth daily.   apixaban 5 MG Tabs tablet Commonly known as: ELIQUIS Take 10 mg twice a day until 04/28/20 then 5 mg twice a day   buPROPion 100 MG tablet Commonly known as: WELLBUTRIN Take 1 tablet a day x 1 week, then 1 tab BID What changed:   how much to take  how to take this  when to take this  additional instructions   dicyclomine 20 MG tablet Commonly known as: BENTYL Take 1 tablet (20 mg total) by mouth every 6 (six) hours as needed for spasms. What changed: when to take this   digoxin 0.125 MG tablet Commonly known as: LANOXIN Take 1 tablet (0.125 mg total) by mouth daily.   ivabradine 5 MG Tabs tablet Commonly known as: CORLANOR Take 1 tablet (5 mg total) by mouth 2 (two) times daily with a meal.   ondansetron 4 MG disintegrating tablet Commonly known as: Zofran ODT Take 1 tablet (4 mg total) by mouth every 8 (eight) hours as needed for nausea or vomiting.   pantoprazole 40 MG tablet Commonly known as: PROTONIX Take 1 tablet (40 mg total) by mouth daily.   sacubitril-valsartan 24-26 MG Commonly known as: ENTRESTO Take 1 tablet by mouth 2 (two) times daily.   spironolactone 25 MG tablet Commonly known as: ALDACTONE Take 0.5 tablets (12.5 mg total) by mouth daily.   sucralfate 1 g tablet Commonly known as: Carafate Take 1 tablet (1 g total) by mouth 4 (four) times daily -  with meals and at bedtime. What changed: when to take this   torsemide 20 MG tablet Commonly known as: DEMADEX Take 1 tablet (20 mg total) by mouth  daily. Start taking on: April 25, 2020          Outstanding Labs/Studies    BMP and CBC at follow up   Duration of Discharge Encounter   Greater than 30 minutes including physician time.  Jarrett Soho, PA 04/24/2020, 12:50 PM

## 2020-04-24 NOTE — Progress Notes (Signed)
RUA TL PICC with no blood return, difficult flush x 3 ports. Alteplase instilled to all three ports with blood return noted and good flush x 3 ports. Immediately returns to no blood return, difficult flush when patient returns arm to his side. Good blood return when extending arm away from body. If problem continues, may need exchange or replacement of the device. [employer] planner.

## 2020-04-24 NOTE — Progress Notes (Signed)
Patient ID: Kevin Baxter, male   DOB: [birth year], 50 y.o.   MRN: [id number]    Advanced Heart Failure Team Rounding Note   Primary Physician: Kathlene November Primary Cardiologist:  Fransico Him  Reason for Consultation: Biventricular CHF  HPI:    Events - 3/17 cath with Kevin cor. RV strain on RHC.  - Post cath developed CP and hemoptysis. CT with submassive bilateral PE and RUL and RML infarcts.  - 3/18 underwent Penumbra clot extraction  - 3/21 milrinone d/ced repeat limited ECHO with continued severe LV dysfunction EF <20% w/large LV thrombus, mild-mod RV dysfunction  Feel good today, on room air.  HR lower in 90s.  SBP 90s-100s.  No lightheadedness or dyspnea.  Co-ox lower at 51% today but drawn early am.  CVP 2.    Objective:    Vital Signs:   Temp:  [97.6 F (36.4 C)-98.4 F (36.9 C)] 97.9 F (36.6 C) (03/26 0823) Pulse Rate:  [99-112] 99 (03/26 0300) Resp:  [18-25] 18 (03/26 0823) BP: (89-102)/(66-76) 89/66 (03/26 0823) SpO2:  [92 %-96 %] 93 % (03/26 0823) Weight:  [122.8 kg] 122.8 kg (03/26 0300) Last BM Date: 04/23/20  Weight change: Filed Weights   04/22/20 0454 04/23/20 0350 04/24/20 0300  Weight: 121.9 kg 119.8 kg 122.8 kg    Intake/Output:   Intake/Output Summary (Last 24 hours) at 04/24/2020 1017 Last data filed at 04/24/2020 0300 Gross per 24 hour  Intake 420 ml  Output 1328 ml  Net -908 ml      Physical Exam    General: NAD Neck: No JVD, no thyromegaly or thyroid nodule.  Lungs: Clear to auscultation bilaterally with Kevin respiratory effort. CV: Nondisplaced PMI.  Heart regular S1/S2, no S3/S4, no murmur.  No peripheral edema.   Abdomen: Soft, nontender, no hepatosplenomegaly, no distention.  Skin: Intact without lesions or rashes.  Neurologic: Alert and oriented x 3.  Psych: Kevin affect. Extremities: No clubbing or cyanosis.  HEENT: Kevin.    Telemetry   NSR 90s. Personally reviewed   Labs   Basic Metabolic Panel: Recent  Labs  Lab 04/18/20 0555 04/19/20 0211 04/20/20 4098 04/21/20 0038 04/22/20 0448 04/23/20 0445 04/24/20 0037  NA 133*   < > 134* 132* 134* 133* 134*  K 3.6   < > 4.1 4.3 4.2 4.1 4.1  CL 92*   < > 100 99 99 97* 96*  CO2 34*   < > 27 26 29 29 29   GLUCOSE 116*   < > 123* 111* 123* 128* 116*  BUN 18   < > 11 11 12 13 18   CREATININE 1.04   < > 0.96 0.91 0.91 0.93 1.16  CALCIUM 8.5*   < > 8.5* 8.4* 8.6* 8.7* 8.7*  MG 2.0  --   --   --   --   --   --   PHOS 2.9  --   --   --   --   --   --    < > = values in this interval not displayed.    Liver Function Tests: Recent Labs  Lab 04/20/20 0837 04/21/20 0038 04/22/20 0448 04/23/20 0445 04/24/20 0037  AST 41 47* 50* 58* 60*  ALT 48* 50* 59* 67* 72*  ALKPHOS 62 71 70 74 75  BILITOT 3.3* 2.4* 2.6* 2.6* 2.4*  PROT 6.1* 5.8* 6.4* 6.8 6.8  ALBUMIN 2.0* 1.9* 2.0* 2.0* 2.1*   Recent Labs  Lab 04/20/20 0027 04/21/20 0038 04/22/20 0448  04/23/20 0445 04/24/20 0037  LIPASE 142* 119* 117* 111* 103*   No results for input(s): AMMONIA in the last 168 hours.  CBC: Recent Labs  Lab 04/20/20 0027 04/21/20 0038 04/22/20 0448 04/23/20 0445 04/24/20 0037  WBC 6.7 6.6 7.3 6.4 5.6  HGB 13.0 12.8* 12.4* 12.8* 12.7*  HCT 39.9 38.8* 38.7* 40.3 39.1  MCV 77.5* 77.8* 79.1* 79.2* 77.3*  PLT 243 257 310 364 388    Cardiac Enzymes: No results for input(s): CKTOTAL, CKMB, CKMBINDEX, TROPONINI in the last 168 hours.  BNP: BNP (last 3 results) Recent Labs    04/12/20 1216 04/16/20 0406  BNP 1,153.9* 885.4*    ProBNP (last 3 results) No results for input(s): PROBNP in the last 8760 hours.   CBG: No results for input(s): GLUCAP in the last 168 hours.  Coagulation Studies: No results for input(s): LABPROT, INR in the last 72 hours.   Imaging   No results found.   Medications:     Current Medications: . sodium chloride   Intravenous Once  . allopurinol  300 mg Oral Daily  . apixaban  10 mg Oral BID   Followed by  .  [START ON 04/28/2020] apixaban  5 mg Oral BID  . Chlorhexidine Gluconate Cloth  6 each Topical Daily  . digoxin  0.125 mg Oral Daily  . feeding supplement  1 Container Oral TID BM  . feeding supplement (PROSource TF)  45 mL Per Tube TID  . ivabradine  5 mg Oral BID WC  . morphine      . pantoprazole (PROTONIX) IV  40 mg Intravenous Q24H  . polyethylene glycol  17 g Oral QHS  . ramelteon  8 mg Oral QHS  . sacubitril-valsartan  1 tablet Oral BID  . senna-docusate  1 tablet Oral Daily  . sodium chloride flush  10-40 mL Intracatheter Q12H  . sodium chloride flush  3 mL Intravenous Q12H  . sodium chloride flush  3 mL Intravenous Q12H  . spironolactone  12.5 mg Oral Daily  . thiamine  100 mg Oral Daily  . [START ON 04/25/2020] torsemide  20 mg Oral Daily    Infusions: . sodium chloride    . sodium chloride    . sodium chloride Stopped (04/16/20 1832)  . sodium chloride Stopped (04/16/20 1832)  . sodium chloride Stopped (04/19/20 1837)       Assessment/Plan   1. 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 Also R femoral DVT - s/p Penumbra clot extraction 3/18 - minimal to no hemoptysis - limited echo 04/19/20 with persistent RV dysfunction   2. Acute systolic biventricular CHF with cardiogenic shock: - ECHO with large LV thrombus, EF <20%, RV dysfunction, dilated LV, no real valve disease, + for RWMA, diastolic function not evaluated - cath 3/18 Kevin cors.  - cMRI LVEF 12% RVEF 81% - also complicated by RV failure from PE - Repeat limited ECHO 04/19/20 with continued severe LV dysfunction EF <20% w/large LV thrombus, mild-mod RV dysfunction - early am co-ox lower at 51%, will repeat now.    - CVP 2, hold torsemide today and restart 20 mg daily tomorrow.  - Continue digoxin.   - Continue spironolactone, no BP room to titrate.  - continue entresto 24/26 BID, no BP room to titrate.  - No b-blocker with low Kevin CO and severely reduced EF, no SGLT2i with  GU issues - Continue Corlanor, HR 90s.   3. LV thrombus: - 2.3 x 3.6 cm  apical thrombus - Initially on heparin. Switched to doac 04/21/20  4. Hypokalemia  - resolved  5. Severe nausea - resolved  6. Hematuria -seems to have resolved off heparin -Atypical urothelial cells on urine pathology. -urology consulted and saw patient, they will manage this in the outpatient setting  7. Acute blood Loss anemia - Hgb stable at 12.7.    I think Mr Pinsky can go home today as long as repeat co-ox is not lower (ordered to be done now).  Needs close followup in CHF clinic.  Meds for home: apixaban (PE/DVT dosing), Entresto 24/26 bid, torsemide 20 daily, spironolactone 12.5 daily, ivabradine 5 mg bid, digoxin 0.125 daily.   Length of Stay: 61  Loralie Champagne, MD  04/24/2020, 10:17 AM  Advanced Heart Failure Team Pager [phone] (M-F; 7a - 4p)  Please contact [facility] for night-coverage after hours (4p -7a ) and weekends on [email or web address]

## 2020-04-24 NOTE — Plan of Care (Signed)
  Problem: Education: Goal: Knowledge of General Education information will improve Description: Including pain rating scale, medication(s)/side effects and non-pharmacologic comfort measures Outcome: Progressing   Problem: Health Behavior/Discharge Planning: Goal: Ability to manage health-related needs will improve Outcome: Progressing   Problem: Clinical Measurements: Goal: Diagnostic test results will improve Outcome: Progressing Goal: Cardiovascular complication will be avoided Outcome: Progressing   Problem: Coping: Goal: Level of anxiety will decrease Outcome: Progressing

## 2020-04-24 NOTE — Progress Notes (Signed)
PT was ambulating with patient. He was doing well, but patient requested to do cardiac rehab. Case manager ordered it for patient and gave eliquis 30days free coupon. IV team removed PICC line. Rt. Groin stitch was still there. Notified cardiac NP Mendel Ryder regarding this. She assess the stitch then removed it out. Patient received to discharge instructions and understood it well. Pt's wife took his all belongings. HS [employer]

## 2020-04-24 NOTE — Evaluation (Addendum)
Physical Therapy Evaluation Patient Details Name: Kevin Baxter MRN: [id number] DOB: [birth year] Today's Date: 04/24/2020   History of Present Illness  pt is a 64 /o male admitted with abdominal pain, nausea and vomiting going on for several weeks.  During admission, pt dx'd with biventricular HF and acute R femoral DVT.  3/17 pt report CP and CTA of chest showed acute central PE, CATH showed ICM with EF of 15%.  PMHx:  HTN, anxiety, GERD  Clinical Impression  Pt is not at baseline function due to days of inactivity, but should be safe at home independently with available assist from his wife. There are no further acute PT needs.  Will sign off at this time.     Follow Up Recommendations Outpatient PT   To prep for CRP2  ;Supervision - Intermittent    Equipment Recommendations  None recommended by PT    Recommendations for Other Services       Precautions / Restrictions Precautions Precautions: Fall      Mobility  Bed Mobility Overal bed mobility: Independent                  Transfers Overall transfer level: Modified independent                  Ambulation/Gait Ambulation/Gait assistance: Supervision Gait Distance (Feet): 700 Feet Assistive device: None Gait Pattern/deviations: Step-through pattern   Gait velocity interpretation: 1.31 - 2.62 ft/sec, indicative of limited community ambulator General Gait Details: steady, but not able to attain age appropriate gait speeds or maintain moderate speeds for longer periods.  Stairs Stairs: Yes Stairs assistance: Supervision Stair Management: One rail Left;Alternating pattern;Forwards Number of Stairs: 4 General stair comments: effortful, but safe with rail  Wheelchair Mobility    Modified Rankin (Stroke Patients Only)       Balance Overall balance assessment: No apparent balance deficits (not formally assessed)                                           Pertinent Vitals/Pain  Pain Assessment: No/denies pain    Home Living Family/patient expects to be discharged to:: Private residence Living Arrangements: Spouse/significant other;Children Available Help at Discharge: Family Type of Home: House Home Access: Stairs to enter Entrance Stairs-Rails: Psychiatric nurse of Steps: several Home Layout: Two level;Bed/bath upstairs Home Equipment: None      Prior Function Level of Independence: Independent               Hand Dominance        Extremity/Trunk Assessment   Upper Extremity Assessment Upper Extremity Assessment: Overall WFL for tasks assessed    Lower Extremity Assessment Lower Extremity Assessment: Overall WFL for tasks assessed (general weakness due to relative inactivity in hospital)    Cervical / Trunk Assessment Cervical / Trunk Assessment: Normal  Communication   Communication: No difficulties  Cognition Arousal/Alertness: Awake/alert Behavior During Therapy: WFL for tasks assessed/performed Overall Cognitive Status: Within Functional Limits for tasks assessed                                        General Comments General comments (skin integrity, edema, etc.): sats 93-95% on RA, EHR 110's and 120's during ambulation in the halls,  HT 100's at rest pre mobility  and 110's post mobility.  No overt dyspnea.    Exercises     Assessment/Plan    PT Assessment All further PT needs can be met in the next venue of care  PT Problem List Decreased activity tolerance;Cardiopulmonary status limiting activity       PT Treatment Interventions      PT Goals (Current goals can be found in the Care Plan section)  Acute Rehab PT Goals Patient Stated Goal: home today PT Goal Formulation: All assessment and education complete, DC therapy    Frequency     Barriers to discharge        Co-evaluation               AM-PAC PT "6 Clicks" Mobility  Outcome Measure Help needed turning from your  back to your side while in a flat bed without using bedrails?: None Help needed moving from lying on your back to sitting on the side of a flat bed without using bedrails?: None Help needed moving to and from a bed to a chair (including a wheelchair)?: None Help needed standing up from a chair using your arms (e.g., wheelchair or bedside chair)?: None Help needed to walk in hospital room?: A Little Help needed climbing 3-5 steps with a railing? : A Little 6 Click Score: 22    End of Session   Activity Tolerance: Patient tolerated treatment well Patient left: in bed;with call bell/phone within reach;with family/visitor present Nurse Communication: Mobility status PT Visit Diagnosis: Muscle weakness (generalized) (M62.81);Difficulty in walking, not elsewhere classified (R26.2)    Time: 1250-1315 PT Time Calculation (min) (ACUTE ONLY): 25 min   Charges:   PT Evaluation $PT Eval Moderate Complexity: 1 Mod PT Treatments $Gait Training: 8-22 mins        04/24/2020  Kevin Baxter., PT Acute Rehabilitation Services [phone]  (pager) [phone]  (office)  Kevin Baxter Kevin Baxter 04/24/2020, 1:53 PM

## 2020-04-24 NOTE — TOC Transition Note (Signed)
Transition of Care [facility]) - CM/SW Discharge Note   Patient Details  Name: Kevin Baxter MRN: [id number] Date of Birth: 1970-05-10  [facility] CM/SW Contact:  Zenon Mayo, RN Phone Number: 04/24/2020, 2:01 PM   Clinical Narrative:    Patient is for dc today, would like outpatient pt, NCM sent referral to [facility] , listed on AVS also. NCM gave patient, eliquis 30 day free coupon and 10.00 coupon, also entresto 10.00 copay coupon.  NCM notified MD to resend the eliquis script to [facility] because they said they did not get it. MD resent it.   Final next level of care: Home/Self Care Barriers to Discharge: No Barriers Identified   Patient Goals and CMS Choice        Discharge Placement                       Discharge Plan and Services                  DME Agency: NA       HH Arranged: NA          Social Determinants of Health (SDOH) Interventions     Readmission Risk Interventions No flowsheet data found.

## 2020-04-28 ENCOUNTER — Ambulatory Visit (INDEPENDENT_AMBULATORY_CARE_PROVIDER_SITE_OTHER): Payer: 59 | Admitting: Physical Therapy

## 2020-04-28 ENCOUNTER — Encounter: Payer: Self-pay | Admitting: Physical Therapy

## 2020-04-28 ENCOUNTER — Other Ambulatory Visit: Payer: Self-pay

## 2020-04-28 ENCOUNTER — Telehealth (HOSPITAL_COMMUNITY): Payer: Self-pay

## 2020-04-28 VITALS — HR 110

## 2020-04-28 DIAGNOSIS — M6281 Muscle weakness (generalized): Secondary | ICD-10-CM

## 2020-04-28 DIAGNOSIS — R2689 Other abnormalities of gait and mobility: Secondary | ICD-10-CM

## 2020-04-28 DIAGNOSIS — R6889 Other general symptoms and signs: Secondary | ICD-10-CM

## 2020-04-28 NOTE — Telephone Encounter (Signed)
Attempted to call patient in regards to Cardiac Rehab - LM on VM 

## 2020-04-28 NOTE — Therapy (Signed)
[place] [address] [place], [place], [address] Phone: [phone]   Fax:  [phone]  Physical Therapy Evaluation  Patient Details  Name: Kevin Baxter MRN: [id number] Date of Birth: 05-21-1970 Referring Provider (PT): daniel bensimhon   Encounter Date: 04/28/2020   PT End of Session - 04/28/20 1249    Visit Number 1    Number of Visits 12    Date for PT Re-Evaluation 06/09/20    PT Start Time 1150    PT Stop Time 1230    PT Time Calculation (min) 40 min    Activity Tolerance Patient tolerated treatment well    Behavior During Therapy [facility] for tasks assessed/performed           Past Medical History:  Diagnosis Date  . Allergy   . Anxiety   . Arthritis   . Fundic gland polyposis of stomach   . GERD with esophagitis   . Gout   . Hx of adenomatous colonic polyps   . Hypertension     Past Surgical History:  Procedure Laterality Date  . COLONOSCOPY  05/2019  . ESOPHAGOGASTRODUODENOSCOPY  05/2019  . HAND SURGERY Right    2006 for a FX  . IR ANGIOGRAM PULMONARY BILATERAL SELECTIVE  04/16/2020  . IR ANGIOGRAM SELECTIVE EACH ADDITIONAL VESSEL  04/16/2020  . IR ANGIOGRAM SELECTIVE EACH ADDITIONAL VESSEL  04/16/2020  . IR THROMBECT PRIM MECH INIT (INCLU) MOD SED  04/16/2020  . IR US GUIDE VASC ACCESS LEFT  04/16/2020  . IR US GUIDE VASC ACCESS LEFT  04/16/2020  . IR US GUIDE VASC ACCESS RIGHT  04/16/2020  . RADIOLOGY WITH ANESTHESIA N/A 04/16/2020   Procedure: IR WITH ANESTHESIA- PULMONARY EMBOLUS INTERVENTION;  Surgeon: Suzette Battiest, MD;  Location: [facility];  Service: Radiology;  Laterality: N/A;  . RIGHT/LEFT HEART CATH AND CORONARY ANGIOGRAPHY N/A 04/15/2020   Procedure: RIGHT/LEFT HEART CATH AND CORONARY ANGIOGRAPHY;  Surgeon: Jolaine Artist, MD;  Location: [facility];  Service: Cardiovascular;  Laterality: N/A;    Vitals:   04/28/20 1205  Pulse: (!) 110      Subjective Assessment -  04/28/20 1200    Subjective Pt was admitted to hosptial for nausea and vomiting and was found to have CHF and pulmonary embolism. Pt had cardiac cath 04/15/20. Pt was d/c'd from hospital 04/25/20. Pt has been monitoring BP at home    Limitations Walking;Standing;House hold activities    How long can you stand comfortably? 5 minutes    How long can you walk comfortably? 5 minutes    Patient Stated Goals improve strength and endurance    Currently in Pain? No/denies              [facility] PT Assessment - 04/28/20 0001      Assessment   Medical Diagnosis muscle weakness    Referring Provider (PT) daniel bensimhon    Onset Date/Surgical Date 04/15/20      Balance Screen   Has the patient fallen in the past 6 months No      [place] residence    Home Layout Two level      Prior Function   Level of Independence Independent      ROM / Strength   AROM / PROM / Strength Strength      Strength   Overall Strength Deficits    Overall Strength Comments grossly 4/5      Transfers  Five time sit to stand comments  29.4 seconds    Comments 5x STS HR 119      Ambulation/Gait   Gait Comments TUG: 13.52, 12.31      Functional Gait  Assessment   Gait assessed  No                      Objective measurements completed on examination: See above findings.       [facility] Adult PT Treatment/Exercise - 04/28/20 0001      Exercises   Exercises Knee/Hip      Knee/Hip Exercises: Standing   Heel Raises 10 reps    Knee Flexion 10 reps    Hip Abduction 10 reps      Knee/Hip Exercises: Seated   Sit to Sand 5 reps                  PT Education - 04/28/20 1248    Education Details Pt educated on energy conservation, PT POC and goals, HEP    Person(s) Educated Patient    Methods Explanation;Demonstration;Handout    Comprehension Returned demonstration;Verbalized understanding               PT Long Term Goals - 04/28/20 1250       PT LONG TERM GOAL #1   Title Pt will be independent with HEP    Time 6    Period Weeks    Status New    Target Date 06/09/20      PT LONG TERM GOAL #2   Title Pt will improve 5x STS by 5 seconds to demo improved functional strength    Time 6    Period Weeks    Status New    Target Date 06/09/20      PT LONG TERM GOAL #3   Title Pt will improve TUG to 15 seconds to demo improved functional mobility    Time 6    Period Weeks    Status New    Target Date 06/09/20                  Plan - 04/28/20 1237    Clinical Impression Statement Pt presents with decreased strength, mobility and activity tolerance and will benefit from skilled PT to address deficts and improve functional mobility    Personal Factors and Comorbidities Comorbidity 3+    Examination-Activity Limitations Squat;Stairs;Stand;Transfers    Examination-Participation Restrictions Occupation;Community Activity;Cleaning;Shop    Stability/Clinical Decision Making Evolving/Moderate complexity    Clinical Decision Making Moderate    Rehab Potential Good    PT Frequency 2x / week    PT Duration 6 weeks    PT Treatment/Interventions ADLs/Self Care Home Management;Therapeutic activities;Therapeutic exercise;Balance training;Functional mobility training;Patient/family education;Gait training    PT Next Visit Plan assess HEP, progress cardiovascular conditioning    PT Home Exercise Plan [email or web address]    Consulted and Agree with Plan of Care Patient           Patient will benefit from skilled therapeutic intervention in order to improve the following deficits and impairments:  Decreased balance,Decreased endurance,Decreased mobility,Cardiopulmonary status limiting activity,Decreased activity tolerance,Decreased strength  Visit Diagnosis: Muscle weakness (generalized) - Plan: PT plan of care cert/re-cert  Decreased functional activity tolerance - Plan: PT plan of care cert/re-cert  Other abnormalities of gait and  mobility - Plan: PT plan of care cert/re-cert     Problem List Patient Active Problem List   Diagnosis Date Noted  . Protein-calorie malnutrition,  severe 04/20/2020  . Congestive heart failure (CHF) ([place])   . Acute saddle pulmonary embolism with acute cor pulmonale (HCC)   . Localized swelling of right lower extremity 04/12/2020  . Multiple gastric polyps 04/12/2020  . Poor responsiveness after MAC anesthesia 04/09/2020 04/12/2020  . Hematemesis with nausea 04/12/2020  . 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  . Left ventricular ejection fraction less than 20% 04/12/2020  . Acute acalculous cholecystitis 04/12/2020  . Mural thrombus of cardiac apex 04/12/2020  . Acute deep vein thrombosis (DVT) of right femoral vein ([place]) 04/12/2020  . Intractable nausea and vomiting 04/11/2020  . Weight loss, unintentional 04/06/2020  . Esophageal dysphagia 04/06/2020  . Abnormal LFTs 04/06/2020  . Depression, major, single episode, moderate ([place]) 03/02/2020  . Pancreatitis, acute 02/26/2020  . Hx of adenomatous colonic polyps   . Morbid obesity ([place]) 04/22/2015  . PCP NOTES >>>>>>>>>>>>>>>>>>>>>>>>> 04/22/2015  . Hyperglycemia 03/11/2014  . Erectile dysfunction 07/21/2011  . Annual physical exam 01/05/2011  . Gout 03/14/2006  . Essential hypertension 03/14/2006  . ALLERGIC RHINITIS 03/14/2006   Isabelle Course, PT  Breonna Gafford 04/28/2020, 12:58 PM  [facility] [place], [place], [address] Phone: [phone]   Fax:  [phone]  Name: Kevin Baxter MRN: [id number] Date of Birth: 1971/01/25

## 2020-04-28 NOTE — Telephone Encounter (Signed)
Pt insurance is active and benefits verified through [facility]. Co-pay $0.00, DED $1,500.00/$0.00 met, out of pocket $6,000.00/$0.00 met, co-insurance 40%. No pre-authorization required. Passport, 04/28/20 @ 11:26AM, VDP#[id number]  Will contact patient to see if he is interested in the Cardiac Rehab Program. If interested, patient will need to complete follow up appt. Once completed, patient will be contacted for scheduling upon review by the RN Navigator.

## 2020-04-28 NOTE — Patient Instructions (Signed)
Access Code: [email or web address] URL: [email or web address] Date: 04/28/2020 Prepared by: Isabelle Course  Exercises Sit to Stand - 1 x daily - 7 x weekly - 3 sets - 5 reps Standing Heel Raise with Support - 1 x daily - 7 x weekly - 3 sets - 5 reps Standing Hip Abduction - 1 x daily - 7 x weekly - 2 sets - 10 reps Standing Alternating Knee Flexion - 1 x daily - 7 x weekly - 2 sets - 10 reps

## 2020-05-02 ENCOUNTER — Other Ambulatory Visit: Payer: Self-pay | Admitting: Internal Medicine

## 2020-05-03 NOTE — Progress Notes (Signed)
ADVANCED HF CLINIC VISIT  PCP: Colon Branch, MD Primary Cardiologist:  HPI:  Kevin Baxter is a 50 y.o. male with HTN, morbid obesity, pancreatitis, DVT with submassive PE and systolic HF due to NICM.  Admitted in 3/22 for intractable n/v. Initially felt to have acute cholecystitis. Echo showed EF 15% with mod RV dysfunction. + large LV clot. Started on inotropes. Cath showed normal coronaries. cMRI LVEF 12% RVEF 21%. No significant LGE.  Post -cath developed worsening CP and tachycardia. CT showed bilateral submassive PE with pulmonary infarct. Underwent catheter-based thrombectomy of PE. Milrinone slowly weaned off. Discharged 04/24/20.   Presents for post-hospital f/u. Here with his wife. Feels pretty good. Doing ADLs and walking around without difficulty. Gets fatigued when taking a shower. Hemoptysis resolved. No bleeding with Eliguis. No edema, orthopnea or PND. Pills feels like they get stuck in the am sometimes. Still nauseated at times. Slightly dizzy when standing.   ROS: All systems negative except as listed in HPI, PMH and Problem List.  SH:  Social History   Socioeconomic History  . Marital status: Married    Spouse name: Not on file  . Number of children: 2  . Years of education: Not on file  . Highest education level: Not on file  Occupational History  . Occupation: works for [employer]  . Smoking status: Never Smoker  . Smokeless tobacco: Never Used  Vaping Use  . Vaping Use: Never used  Substance and Sexual Activity  . Alcohol use: Yes    Comment: very rare   . Drug use: No  . Sexual activity: Not on file  Other Topics Concern  . Not on file  Social History Narrative   Household: pt, wife daughter (2013),  boy  (2006),    Social Determinants of Radio broadcast assistant Strain: Not on file  Food Insecurity: Not on file  Transportation Needs: Not on file  Physical Activity: Not on file  Stress: Not on file  Social Connections: Not  on file  Intimate Partner Violence: Not on file    FH:  Family History  Problem Relation Age of Onset  . Cancer Mother        CERVICAL  . Hypertension Father   . Heart failure Father        no MI, d/t etoh  . Diabetes Sister   . Diabetes Sister   . Diabetes Paternal Aunt   . Diabetes Cousin   . Colon cancer Neg Hx   . Prostate cancer Neg Hx   . Stroke Neg Hx   . Esophageal cancer Neg Hx   . Rectal cancer Neg Hx   . Stomach cancer Neg Hx     Past Medical History:  Diagnosis Date  . Allergy   . Anxiety   . Arthritis   . CHF (congestive heart failure) ([place])   . Fundic gland polyposis of stomach   . GERD with esophagitis   . Gout   . Hx of adenomatous colonic polyps   . Hypertension     Current Outpatient Medications  Medication Sig Dispense Refill  . allopurinol (ZYLOPRIM) 300 MG tablet Take 1 tablet (300 mg total) by mouth daily. 30 tablet 0  . apixaban (ELIQUIS) 5 MG TABS tablet Take 5 mg by mouth 2 (two) times daily.    [place] Kitchen buPROPion (WELLBUTRIN) 100 MG tablet Take 1 tablet (100 mg total) by mouth 2 (two) times daily. 180 tablet  0  . dicyclomine (BENTYL) 20 MG tablet Take 1 tablet (20 mg total) by mouth every 6 (six) hours as needed for spasms. 90 tablet 0  . digoxin (LANOXIN) 0.125 MG tablet Take 1 tablet (0.125 mg total) by mouth daily. 30 tablet 6  . ivabradine (CORLANOR) 5 MG TABS tablet Take 1 tablet (5 mg total) by mouth 2 (two) times daily with a meal. 60 tablet 6  . ondansetron (ZOFRAN ODT) 4 MG disintegrating tablet Take 1 tablet (4 mg total) by mouth every 8 (eight) hours as needed for nausea or vomiting. 20 tablet 0  . pantoprazole (PROTONIX) 40 MG tablet Take 1 tablet (40 mg total) by mouth daily. 30 tablet 3  . sacubitril-valsartan (ENTRESTO) 24-26 MG Take 1 tablet by mouth 2 (two) times daily. 60 tablet 6  . spironolactone (ALDACTONE) 25 MG tablet Take 0.5 tablets (12.5 mg total) by mouth daily. 30 tablet 6  . torsemide (DEMADEX) 20 MG tablet Take 1 tablet  (20 mg total) by mouth daily. 30 tablet 6   No current facility-administered medications for this encounter.    Vitals:   05/05/20 0915  BP: 100/60  Pulse: (!) 115  SpO2: 97%  Weight: 119.1 kg (262 lb 9.6 oz)    PHYSICAL EXAM:  General:  Well appearing. No resp difficulty HEENT: normal Neck: supple. JVP flat. Carotids 2+ bilaterally; no bruits. No lymphadenopathy or thryomegaly appreciated. Cor: PMI normal. Tachy regular rate & rhythm. No rubs, gallops or murmurs. Lungs: clear Abdomen: soft, nontender, nondistended. No hepatosplenomegaly. No bruits or masses. Good bowel sounds. Extremities: no cyanosis, clubbing, rash, edema Neuro: alert & orientedx3, cranial nerves grossly intact. Moves all 4 extremities w/o difficulty. Affect pleasant.   ECG: Sinus tach 116 Personally reviewed  ASSESSMENT & PLAN:   1. Acute systolic biventricular CHF with cardiogenic shock: - ECHO 3/22  LVEF < 20% with large LV thrombus,mild to moderate RV dysfunction - Cardiac catheterization 04/16/20 with normal coronaries - cMRI LVEF 12% RVEF 21%. No significant LGE - also complicated by RV failure from PE - Improving slowly. NYHA II-Baxter but still tachycardic - Volume status ok  - Continue digoxin 0.125  - Continue spironolactone 12.5 - Continue entresto 24/26 BID - No b-blocker with low normal CO and severely reduced EF, no SGLT2i with GU issues -Increase ivabradine to 7.5 bid  2. LV thrombus: - 2.3 x 3.6 cm apical thrombus - Continue Eliquis. No bleeding   3. 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 Also R femoral DVT - Chest CT 3/22 bilateral submassive PE with RV strain and RUL infarct - s/p Penumbra clot extraction 04/16/20 - Continue Eliquis. No bleeding. Hemoptysis resolved  Check labs today. Continue to gradually increase exercise activities but no intense exercise or valsalva. Return 1 month with repeat echo and consider CR at that time.    Glori Bickers, MD  9:52 AM

## 2020-05-04 ENCOUNTER — Ambulatory Visit (INDEPENDENT_AMBULATORY_CARE_PROVIDER_SITE_OTHER): Payer: 59 | Admitting: Licensed Clinical Social Worker

## 2020-05-04 ENCOUNTER — Telehealth: Payer: Self-pay

## 2020-05-04 ENCOUNTER — Ambulatory Visit (INDEPENDENT_AMBULATORY_CARE_PROVIDER_SITE_OTHER): Payer: 59 | Admitting: Physical Therapy

## 2020-05-04 ENCOUNTER — Other Ambulatory Visit: Payer: Self-pay

## 2020-05-04 DIAGNOSIS — R2689 Other abnormalities of gait and mobility: Secondary | ICD-10-CM

## 2020-05-04 DIAGNOSIS — F4323 Adjustment disorder with mixed anxiety and depressed mood: Secondary | ICD-10-CM | POA: Diagnosis not present

## 2020-05-04 DIAGNOSIS — R6889 Other general symptoms and signs: Secondary | ICD-10-CM | POA: Diagnosis not present

## 2020-05-04 DIAGNOSIS — M6281 Muscle weakness (generalized): Secondary | ICD-10-CM | POA: Diagnosis not present

## 2020-05-04 NOTE — Therapy (Signed)
[place], [place], [address] Phone: [phone]   Fax:  [phone]  Physical Therapy Treatment  Patient Details  Name: Kevin Baxter MRN: [id number] Date of Birth: 1970/11/02 Referring Provider (PT): daniel bensimhon   Encounter Date: 05/04/2020   PT End of Session - 05/04/20 1148    Visit Number 2    Number of Visits 12    Date for PT Re-Evaluation 06/09/20    PT Start Time 1115   pt late due to prior appt   PT Stop Time 1146    PT Time Calculation (min) 31 min    Activity Tolerance Patient tolerated treatment well    Behavior During Therapy [facility] for tasks assessed/performed           Past Medical History:  Diagnosis Date  . Allergy   . Anxiety   . Arthritis   . Fundic gland polyposis of stomach   . GERD with esophagitis   . Gout   . Hx of adenomatous colonic polyps   . Hypertension     Past Surgical History:  Procedure Laterality Date  . COLONOSCOPY  05/2019  . ESOPHAGOGASTRODUODENOSCOPY  05/2019  . HAND SURGERY Right    2006 for a FX  . IR ANGIOGRAM PULMONARY BILATERAL SELECTIVE  04/16/2020  . IR ANGIOGRAM SELECTIVE EACH ADDITIONAL VESSEL  04/16/2020  . IR ANGIOGRAM SELECTIVE EACH ADDITIONAL VESSEL  04/16/2020  . IR THROMBECT PRIM MECH INIT (INCLU) MOD SED  04/16/2020  . IR US GUIDE VASC ACCESS LEFT  04/16/2020  . IR US GUIDE VASC ACCESS LEFT  04/16/2020  . IR US GUIDE VASC ACCESS RIGHT  04/16/2020  . RADIOLOGY WITH ANESTHESIA N/A 04/16/2020   Procedure: IR WITH ANESTHESIA- PULMONARY EMBOLUS INTERVENTION;  Surgeon: Suzette Battiest, MD;  Location: [facility];  Service: Radiology;  Laterality: N/A;  . RIGHT/LEFT HEART CATH AND CORONARY ANGIOGRAPHY N/A 04/15/2020   Procedure: RIGHT/LEFT HEART CATH AND CORONARY ANGIOGRAPHY;  Surgeon: Jolaine Artist, MD;  Location: [facility];  Service: Cardiovascular;  Laterality: N/A;    There were no vitals filed for this visit.    Subjective Assessment - 05/04/20 1119    Subjective Pt states he has been "trying to do what I can" at home    Patient Stated Goals improve strength and endurance    Currently in Pain? No/denies                             [facility] Adult PT Treatment/Exercise - 05/04/20 0001      Knee/Hip Exercises: Aerobic   Nustep level 5 2 minutes x 3 for cardivascular endurance      Knee/Hip Exercises: Standing   Heel Raises 10 reps    Knee Flexion 10 reps    Hip Abduction 10 reps    Other Standing Knee Exercises mini squat with UE support x 10      Knee/Hip Exercises: Seated   Long Arc Quad Strengthening;Both;10 reps    Long Arc Quad Limitations 3 sec hold    Other Seated Knee/Hip Exercises slump/straighten x 10 with focus on improving back strength    Sit to [employer] 5 reps                       PT Long Term Goals - 04/28/20 1250      PT LONG TERM GOAL #1   Title  Pt will be independent with HEP    Time 6    Period Weeks    Status New    Target Date 06/09/20      PT LONG TERM GOAL #2   Title Pt will improve 5x STS by 5 seconds to demo improved functional strength    Time 6    Period Weeks    Status New    Target Date 06/09/20      PT LONG TERM GOAL #3   Title Pt will improve TUG to 15 seconds to demo improved functional mobility    Time 6    Period Weeks    Status New    Target Date 06/09/20                 Plan - 05/04/20 1149    Clinical Impression Statement Pt requires cues and encouragement to rest between exercises. Pt with good motivation to improve and participates well with all exercises    PT Next Visit Plan progress cardiovascular conditioning    PT Home Exercise Plan [email or web address]    Consulted and Agree with Plan of Care Patient           Patient will benefit from skilled therapeutic intervention in order to improve the following deficits and impairments:     Visit Diagnosis: Decreased functional activity tolerance  Muscle  weakness (generalized)  Other abnormalities of gait and mobility     Problem List Patient Active Problem List   Diagnosis Date Noted  . Protein-calorie malnutrition, severe 04/20/2020  . Congestive heart failure (CHF) ([place])   . Acute saddle pulmonary embolism with acute cor pulmonale (HCC)   . Localized swelling of right lower extremity 04/12/2020  . Multiple gastric polyps 04/12/2020  . Poor responsiveness after MAC anesthesia 04/09/2020 04/12/2020  . Hematemesis with nausea 04/12/2020  . 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  . Left ventricular ejection fraction less than 20% 04/12/2020  . Acute acalculous cholecystitis 04/12/2020  . Mural thrombus of cardiac apex 04/12/2020  . Acute deep vein thrombosis (DVT) of right femoral vein ([place]) 04/12/2020  . Intractable nausea and vomiting 04/11/2020  . Weight loss, unintentional 04/06/2020  . Esophageal dysphagia 04/06/2020  . Abnormal LFTs 04/06/2020  . Depression, major, single episode, moderate ([place]) 03/02/2020  . Pancreatitis, acute 02/26/2020  . Hx of adenomatous colonic polyps   . Morbid obesity ([place]) 04/22/2015  . PCP NOTES >>>>>>>>>>>>>>>>>>>>>>>>> 04/22/2015  . Hyperglycemia 03/11/2014  . Erectile dysfunction 07/21/2011  . Annual physical exam 01/05/2011  . Gout 03/14/2006  . Essential hypertension 03/14/2006  . ALLERGIC RHINITIS 03/14/2006    Isabelle Course, PT   Adriella Essex 05/04/2020, 11:50 AM  [facility] [place], [place], [address] Phone: [phone]   Fax:  [phone]  Name: Kevin Baxter MRN: [id number] Date of Birth: April 22, 1970

## 2020-05-04 NOTE — Progress Notes (Signed)
Comprehensive Clinical Assessment (CCA) Note  05/04/2020 Kevin Baxter [id number]  Chief Complaint:  Chief Complaint  Patient presents with  . Adjustment Disorder   Visit Diagnosis: Adjustment disorder with mixed anxiety and depressed mood   CCA Biopsychosocial Intake/Chief Complaint:  Anxiety, sleep issues  Current Symptoms/Problems: Anxiety: worried, health concerns/heart failure, worries about being a burden to family, health issues brought on panic attacks,     Mood: down/depressed,  feels like a burden at time, irritabilty was high before hospital but trying to maintain it now, sleeps about 6-7 hours a night, low energy, difficulty with concentration, limited appetite, crying, feelings of worthlessness at times,   Patient Reported Schizophrenia/Schizoaffective Diagnosis in Past: No   Strengths: helping others, good listener, being there for others, funny  Preferences: Prefers being around others, doesn't prefer being alone  Abilities: used to write poetry/writing, toastmasters,   Type of Services Patient Feels are Needed: Therapy   Initial Clinical Notes/Concerns: Symptoms have been present (anxiety) for some time but increased when his health declined, symptoms occur 3-4 days out of the week, symptom are moderate per patient   Mental Health Symptoms Depression:  Increase/decrease in appetite; Difficulty Concentrating; Change in energy/activity; Weight gain/loss; Sleep (too much or little); Tearfulness   Duration of Depressive symptoms: Greater than two weeks   Mania:  None   Anxiety:   Difficulty concentrating; Fatigue; Worrying   Psychosis:  None   Duration of Psychotic symptoms: No data recorded  Trauma:  None   Obsessions:  None   Compulsions:  None   Inattention:  None   Hyperactivity/Impulsivity:  N/A   Oppositional/Defiant Behaviors:  None   Emotional Irregularity:  None   Other Mood/Personality Symptoms:  None    Mental Status  Exam Appearance and self-care  Stature:  Average   Weight:  Average weight   Clothing:  Casual   Grooming:  Normal   Cosmetic use:  None   Posture/gait:  Normal   Motor activity:  Not Remarkable   Sensorium  Attention:  Normal   Concentration:  Normal   Orientation:  X5   Recall/memory:  Normal   Affect and Mood  Affect:  Appropriate   Mood:  Anxious   Relating  Eye contact:  Normal   Facial expression:  Anxious   Attitude toward examiner:  Cooperative   Thought and Language  Speech flow: Normal   Thought content:  Appropriate to Mood and Circumstances   Preoccupation:  None   Hallucinations:  None   Organization:  No data recorded  [employer] of Knowledge:  Good   Intelligence:  Average   Abstraction:  Normal   Judgement:  Fair   Art therapist:  Adequate   Insight:  Good   Decision Making:  Normal   Social Functioning  Social Maturity:  Responsible   Social Judgement:  Normal   Stress  Stressors:  Illness   Coping Ability:  Deficient supports   Skill Deficits:  None   Supports:  Family     Religion: Religion/Spirituality Are You A Religious Person?: Yes What is Your Religious Affiliation?: Other How Might This Affect Treatment?: Support in treatment  Leisure/Recreation: Leisure / Recreation Do You Have Hobbies?: Yes Leisure and Hobbies: Read, ride motorcycles  Exercise/Diet: Exercise/Diet Do You Exercise?: No Have You Gained or Lost A Significant Amount of Weight in the Past Six Months?: Yes-Gained Number of Pounds Gained: 80 Do You Follow a Special Diet?: Yes Type of Diet: No salt/heart  healthy Do You Have Any Trouble Sleeping?: Yes Explanation of Sleeping Difficulties: Sleep is better now but was not sleeping at all for months   CCA Employment/Education Employment/Work Situation: Employment / Work Situation Employment situation: Employed (On short term disability) 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 patient been employed?: 22 years Patient's job has been impacted by current illness: No What is the longest time patient has a held a job?: 22 years 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 Has patient ever been in the [employer]?: No  Education: Education Is Patient Currently Attending School?: No Last Grade Completed: 12 Name of [place]: [employer] Did Teacher, adult education From [employer]?: Yes Did Physicist, medical?:  (Some college) Did Heritage manager?: No Did You Have Any Special Interests In School?: English, Science Did You Have An Individualized Education Program (IIEP): No Did You Have Any Difficulty At [employer]?: No Patient's Education Has Been Impacted by Current Illness: No   CCA Family/Childhood History Family and Relationship History: Family history Marital status: Married Number of Years Married: 62 What types of issues is patient dealing with in the relationship?: None Additional relationship information: none Are you sexually active?: Yes What is your sexual orientation?: Heterosexual Has your sexual activity been affected by drugs, alcohol, medication, or emotional stress?: Physical health Does patient have children?: Yes How many children?: 2 How is patient's relationship with their children?: Son, daughter: great  Childhood History:  Childhood History By whom was/is the patient raised?: Both parents Additional childhood history information: Both parents in the home. Patient describes childhood as "I had a good time." Description of patient's relationship with caregiver when they were a child: Mother: good relationship,   Father: close Patient's description of current relationship with people who raised him/her: Mother: deceased in 04/11/11,    Father: deceased in Apr 10, 2016 How were you disciplined when you got in trouble as a child/adolescent?: spanked, talked to, grounded, things taken away Does patient have  siblings?: Yes Number of Siblings: 5 Description of patient's current relationship with siblings: 2 older sisters, 1 older brothers, Younger brother: pretty good/close Did patient suffer any verbal/emotional/physical/sexual abuse as a child?: No Did patient suffer from severe childhood neglect?: No Has patient ever been sexually abused/assaulted/raped as an adolescent or adult?: No Was the patient ever a victim of a crime or a disaster?: No Witnessed domestic violence?: No Has patient been affected by domestic violence as an adult?: No  Child/Adolescent Assessment:     CCA Substance Use Alcohol/Drug Use: Alcohol / Drug Use Pain Medications: See patient MAR Prescriptions: See patient MAR Over the Counter: See patient MAR History of alcohol / drug use?: No history of alcohol / drug abuse                         ASAM's:  Six Dimensions of Multidimensional Assessment  Dimension 1:  Acute Intoxication and/or Withdrawal Potential:   Dimension 1:  Description of individual's past and current experiences of substance use and withdrawal: None  Dimension 2:  Biomedical Conditions and Complications:   Dimension 2:  Description of patient's biomedical conditions and  complications: None  Dimension 3:  Emotional, Behavioral, or Cognitive Conditions and Complications:  Dimension 3:  Description of emotional, behavioral, or cognitive conditions and complications: None  Dimension 4:  Readiness to Change:  Dimension 4:  Description of Readiness to Change criteria: None  Dimension 5:  Relapse,  Continued use, or Continued Problem Potential:  Dimension 5:  Relapse, continued use, or continued problem potential critiera description: None  Dimension 6:  Recovery/Living Environment:  Dimension 6:  Recovery/Iiving environment criteria description: None  ASAM Severity Score: ASAM's Severity Rating Score: 0  ASAM Recommended Level of Treatment:     Substance use Disorder (SUD)     Recommendations for Services/Supports/Treatments: Recommendations for Services/Supports/Treatments Recommendations For Services/Supports/Treatments: Individual Therapy  DSM5 Diagnoses: Patient Active Problem List   Diagnosis Date Noted  . Protein-calorie malnutrition, severe 04/20/2020  . Congestive heart failure (CHF) ([place])   . Acute saddle pulmonary embolism with acute cor pulmonale (HCC)   . Localized swelling of right lower extremity 04/12/2020  . Multiple gastric polyps 04/12/2020  . Poor responsiveness after MAC anesthesia 04/09/2020 04/12/2020  . Hematemesis with nausea 04/12/2020  . 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  . Left ventricular ejection fraction less than 20% 04/12/2020  . Acute acalculous cholecystitis 04/12/2020  . Mural thrombus of cardiac apex 04/12/2020  . Acute deep vein thrombosis (DVT) of right femoral vein ([place]) 04/12/2020  . Intractable nausea and vomiting 04/11/2020  . Weight loss, unintentional 04/06/2020  . Esophageal dysphagia 04/06/2020  . Abnormal LFTs 04/06/2020  . Depression, major, single episode, moderate ([place]) 03/02/2020  . Pancreatitis, acute 02/26/2020  . Hx of adenomatous colonic polyps   . Morbid obesity ([place]) 04/22/2015  . PCP NOTES >>>>>>>>>>>>>>>>>>>>>>>>> 04/22/2015  . Hyperglycemia 03/11/2014  . Erectile dysfunction 07/21/2011  . Annual physical exam 01/05/2011  . Gout 03/14/2006  . Essential hypertension 03/14/2006  . ALLERGIC RHINITIS 03/14/2006    Patient Centered Plan: Patient is on the following Treatment Plan(s):  Anxiety and Depression   Referrals to Alternative Service(s): Referred to Alternative Service(s):   Place:   Date:   Time:    Referred to Alternative Service(s):   Place:   Date:   Time:    Referred to Alternative Service(s):   Place:   Date:   Time:    Referred to Alternative Service(s):   Place:   Date:   Time:     Glori Bickers, LCSW

## 2020-05-04 NOTE — Telephone Encounter (Signed)
**Note De-Identified [place] Nies Obfuscation** I started a Entresto PA through covermymeds. Key: [id number]

## 2020-05-04 NOTE — Telephone Encounter (Signed)
**Note De-Identified Evette Diclemente Obfuscation** Following message received from covermymeds: Gilmer Mor III Key: [id number] - PA Case ID: [id number] - Rx #: [id number] Outcome: Approved today ENTRESTO TAB 24-26MG  is approved through 05/04/2021. Drug: Delene Loll 24-26MG  tablets Form: [facility] Electronic Prior Authorization Form (2017 NCPDP)  I have notified [facility] of this approval.

## 2020-05-05 ENCOUNTER — Ambulatory Visit (HOSPITAL_COMMUNITY)
Admission: RE | Admit: 2020-05-05 | Discharge: 2020-05-05 | Disposition: A | Discharge status: Home / Self Care | Payer: 59 | Source: Ambulatory Visit | Attending: Internal Medicine | Admitting: Internal Medicine

## 2020-05-05 ENCOUNTER — Ambulatory Visit: Payer: 59 | Admitting: Cardiology

## 2020-05-05 ENCOUNTER — Encounter (HOSPITAL_COMMUNITY): Payer: Self-pay | Admitting: Internal Medicine

## 2020-05-05 VITALS — BP 100/60 | HR 115 | Wt 262.6 lb

## 2020-05-05 DIAGNOSIS — Z8249 Family history of ischemic heart disease and other diseases of the circulatory system: Secondary | ICD-10-CM | POA: Insufficient documentation

## 2020-05-05 DIAGNOSIS — I5022 Chronic systolic (congestive) heart failure: Secondary | ICD-10-CM | POA: Insufficient documentation

## 2020-05-05 DIAGNOSIS — Z7182 Exercise counseling: Secondary | ICD-10-CM | POA: Insufficient documentation

## 2020-05-05 DIAGNOSIS — Z7901 Long term (current) use of anticoagulants: Secondary | ICD-10-CM | POA: Insufficient documentation

## 2020-05-05 DIAGNOSIS — I2609 Other pulmonary embolism with acute cor pulmonale: Secondary | ICD-10-CM

## 2020-05-05 DIAGNOSIS — R5383 Other fatigue: Secondary | ICD-10-CM | POA: Diagnosis present

## 2020-05-05 DIAGNOSIS — I110 Hypertensive heart disease with heart failure: Secondary | ICD-10-CM | POA: Insufficient documentation

## 2020-05-05 DIAGNOSIS — I513 Intracardiac thrombosis, not elsewhere classified: Secondary | ICD-10-CM | POA: Diagnosis not present

## 2020-05-05 DIAGNOSIS — I82411 Acute embolism and thrombosis of right femoral vein: Secondary | ICD-10-CM | POA: Insufficient documentation

## 2020-05-05 DIAGNOSIS — I82890 Acute embolism and thrombosis of other specified veins: Secondary | ICD-10-CM | POA: Diagnosis not present

## 2020-05-05 DIAGNOSIS — Z79899 Other long term (current) drug therapy: Secondary | ICD-10-CM | POA: Insufficient documentation

## 2020-05-05 DIAGNOSIS — I5082 Biventricular heart failure: Secondary | ICD-10-CM | POA: Diagnosis not present

## 2020-05-05 DIAGNOSIS — I428 Other cardiomyopathies: Secondary | ICD-10-CM | POA: Diagnosis not present

## 2020-05-05 DIAGNOSIS — R110 Nausea: Secondary | ICD-10-CM | POA: Insufficient documentation

## 2020-05-05 DIAGNOSIS — R42 Dizziness and giddiness: Secondary | ICD-10-CM | POA: Diagnosis not present

## 2020-05-05 HISTORY — DX: Heart failure, unspecified: I50.9

## 2020-05-05 LAB — COMPREHENSIVE METABOLIC PANEL
ALT: 96 U/L — ABNORMAL HIGH (ref 0–44)
AST: 56 U/L — ABNORMAL HIGH (ref 15–41)
Albumin: 3.2 g/dL — ABNORMAL LOW (ref 3.5–5.0)
Alkaline Phosphatase: 105 U/L (ref 38–126)
Anion gap: 8 (ref 5–15)
BUN: 21 mg/dL — ABNORMAL HIGH (ref 6–20)
CO2: 30 mmol/L (ref 22–32)
Calcium: 9.9 mg/dL (ref 8.9–10.3)
Chloride: 96 mmol/L — ABNORMAL LOW (ref 98–111)
Creatinine, Ser: 1.63 mg/dL — ABNORMAL HIGH (ref 0.61–1.24)
GFR, Estimated: 51 mL/min — ABNORMAL LOW (ref >60)
Glucose, Bld: 121 mg/dL — ABNORMAL HIGH (ref 70–99)
Potassium: 4.9 mmol/L (ref 3.5–5.1)
Sodium: 134 mmol/L — ABNORMAL LOW (ref 135–145)
Total Bilirubin: 1.8 mg/dL — ABNORMAL HIGH (ref 0.3–1.2)
Total Protein: 8.4 g/dL — ABNORMAL HIGH (ref 6.5–8.1)

## 2020-05-05 LAB — CBC
HCT: 47.9 % (ref 39.0–52.0)
Hemoglobin: 15 g/dL (ref 13.0–17.0)
MCH: 24.9 pg — ABNORMAL LOW (ref 26.0–34.0)
MCHC: 31.3 g/dL (ref 30.0–36.0)
MCV: 79.6 fL — ABNORMAL LOW (ref 80.0–100.0)
Platelets: 291 10*3/uL (ref 150–400)
RBC: 6.02 MIL/uL — ABNORMAL HIGH (ref 4.22–5.81)
RDW: 16.9 % — ABNORMAL HIGH (ref 11.5–15.5)
WBC: 4.4 10*3/uL (ref 4.0–10.5)
nRBC: 0 % (ref 0.0–0.2)

## 2020-05-05 LAB — BRAIN NATRIURETIC PEPTIDE: B Natriuretic Peptide: 107.1 pg/mL — ABNORMAL HIGH (ref 0.0–100.0)

## 2020-05-05 LAB — DIGOXIN LEVEL: Digoxin Level: 0.9 ng/mL (ref 0.8–2.0)

## 2020-05-05 MED ORDER — IVABRADINE HCL 5 MG PO TABS: 7.5000 mg | ORAL_TABLET | Freq: Two times a day (BID) | ORAL | 6 refills | Status: DC

## 2020-05-05 NOTE — Patient Instructions (Addendum)
Increase Corlanor to 7.5 mg (1 & 1/2 tabs) Twice daily   Labs done today, your results will be available in MyChart, we will contact you for abnormal readings.  Your physician recommends that you schedule a follow-up appointment in: 1 month with echocardiogram  If you have any questions or concerns before your next appointment please send [place] a message through [place] or call our office at [phone].    TO LEAVE A MESSAGE FOR THE NURSE SELECT OPTION 2, PLEASE LEAVE A MESSAGE INCLUDING: . YOUR NAME . DATE OF BIRTH . CALL BACK NUMBER . REASON FOR CALL**this is important as we prioritize the call backs  [place] AS LONG AS YOU CALL BEFORE 4:00 PM  At the [facility], you and your health needs are our priority. As part of our continuing mission to provide you with exceptional heart care, we have created designated Provider Care Teams. These Care Teams include your primary Cardiologist (physician) and Advanced Practice Providers (APPs- Physician Assistants and Nurse Practitioners) who all work together to provide you with the care you need, when you need it.   You may see any of the following providers on your designated Care Team at your next follow up: Marland Kitchen Dr Glori Bickers . Dr Loralie Champagne . Dr Vickki Muff . Darrick Grinder, NP . Lyda Jester, [place] . Audry Riles, PharmD   Please be sure to bring in all your medications bottles to every appointment.

## 2020-05-05 NOTE — Addendum Note (Signed)
Encounter addended by: Jolaine Artist, MD on: 05/05/2020 11:08 AM  Actions taken: Level of Service modified, Visit diagnoses modified

## 2020-05-07 ENCOUNTER — Ambulatory Visit (INDEPENDENT_AMBULATORY_CARE_PROVIDER_SITE_OTHER): Payer: 59 | Admitting: Physical Therapy

## 2020-05-07 ENCOUNTER — Telehealth (HOSPITAL_COMMUNITY): Payer: Self-pay | Admitting: *Deleted

## 2020-05-07 ENCOUNTER — Other Ambulatory Visit: Payer: Self-pay

## 2020-05-07 DIAGNOSIS — R6889 Other general symptoms and signs: Secondary | ICD-10-CM

## 2020-05-07 DIAGNOSIS — I5022 Chronic systolic (congestive) heart failure: Secondary | ICD-10-CM

## 2020-05-07 DIAGNOSIS — R2689 Other abnormalities of gait and mobility: Secondary | ICD-10-CM | POA: Diagnosis not present

## 2020-05-07 DIAGNOSIS — M6281 Muscle weakness (generalized): Secondary | ICD-10-CM | POA: Diagnosis not present

## 2020-05-07 MED ORDER — TORSEMIDE 20 MG PO TABS: 20.0000 mg | ORAL_TABLET | Freq: Every day | ORAL | 6 refills | Status: DC | PRN

## 2020-05-07 NOTE — Therapy (Signed)
[facility] [place], [place], [address] Phone: [phone]   Fax:  [phone]  Physical Therapy Treatment  Patient Details  Name: Kevin Baxter MRN: [id number] Date of Birth: Sep 06, 1970 Referring Provider (PT): daniel bensimhon   Encounter Date: 05/07/2020   PT End of Session - 05/07/20 1143    Visit Number 3    Number of Visits 12    Date for PT Re-Evaluation 06/09/20    PT Start Time 1100    PT Stop Time 1140    PT Time Calculation (min) 40 min    Activity Tolerance Patient tolerated treatment well    Behavior During Therapy [facility] for tasks assessed/performed           Past Medical History:  Diagnosis Date  . Allergy   . Anxiety   . Arthritis   . CHF (congestive heart failure) ([place])   . Fundic gland polyposis of stomach   . GERD with esophagitis   . Gout   . Hx of adenomatous colonic polyps   . Hypertension     Past Surgical History:  Procedure Laterality Date  . COLONOSCOPY  05/2019  . ESOPHAGOGASTRODUODENOSCOPY  05/2019  . HAND SURGERY Right    2006 for a FX  . IR ANGIOGRAM PULMONARY BILATERAL SELECTIVE  04/16/2020  . IR ANGIOGRAM SELECTIVE EACH ADDITIONAL VESSEL  04/16/2020  . IR ANGIOGRAM SELECTIVE EACH ADDITIONAL VESSEL  04/16/2020  . IR THROMBECT PRIM MECH INIT (INCLU) MOD SED  04/16/2020  . IR US GUIDE VASC ACCESS LEFT  04/16/2020  . IR US GUIDE VASC ACCESS LEFT  04/16/2020  . IR US GUIDE VASC ACCESS RIGHT  04/16/2020  . RADIOLOGY WITH ANESTHESIA N/A 04/16/2020   Procedure: IR WITH ANESTHESIA- PULMONARY EMBOLUS INTERVENTION;  Surgeon: Suzette Battiest, MD;  Location: [facility];  Service: Radiology;  Laterality: N/A;  . RIGHT/LEFT HEART CATH AND CORONARY ANGIOGRAPHY N/A 04/15/2020   Procedure: RIGHT/LEFT HEART CATH AND CORONARY ANGIOGRAPHY;  Surgeon: Jolaine Artist, MD;  Location: [facility];  Service: Cardiovascular;  Laterality: N/A;    There were no vitals filed for this  visit.   Subjective Assessment - 05/07/20 1105    Subjective Pt saw MD who is pleased with progress. Pt states he has not been doing exercises at home but has been walking and performing ADLs    Patient Stated Goals improve strength and endurance    Currently in Pain? No/denies                             [facility] Adult PT Treatment/Exercise - 05/07/20 0001      Knee/Hip Exercises: Aerobic   Nustep level 5 x 5 minutes      Knee/Hip Exercises: Standing   Heel Raises 10 reps    Knee Flexion Both;Strengthening;10 reps    Hip Abduction Both;10 reps    Hip Extension Both;10 reps    Other Standing Knee Exercises mini squat x 10      Knee/Hip Exercises: Seated   Long Arc Quad Strengthening;Both;10 reps    Long Arc Quad Limitations 3 sec hold    Other Seated Knee/Hip Exercises slump/straighten x 10 with focus on trunk extension    Sit to [employer] 5 reps                       PT Long Term Goals - 04/28/20 1250  PT LONG TERM GOAL #1   Title Pt will be independent with HEP    Time 6    Period Weeks    Status New    Target Date 06/09/20      PT LONG TERM GOAL #2   Title Pt will improve 5x STS by 5 seconds to demo improved functional strength    Time 6    Period Weeks    Status New    Target Date 06/09/20      PT LONG TERM GOAL #3   Title Pt will improve TUG to 15 seconds to demo improved functional mobility    Time 6    Period Weeks    Status New    Target Date 06/09/20                 Plan - 05/07/20 1144    Clinical Impression Statement Pt continues to require encouragement to rest between exercises. Pt continues with decreased trunk strength, sitting in forwrad flexed posture. Plan to progress postural strengthening next visit    PT Next Visit Plan progress cardiovascular conditioning and postural strength    PT Home Exercise Plan [id number]           Patient will benefit from skilled therapeutic intervention in order to improve  the following deficits and impairments:     Visit Diagnosis: Decreased functional activity tolerance  Muscle weakness (generalized)  Other abnormalities of gait and mobility     Problem List Patient Active Problem List   Diagnosis Date Noted  . Protein-calorie malnutrition, severe 04/20/2020  . Congestive heart failure (CHF) ([place])   . Acute saddle pulmonary embolism with acute cor pulmonale (HCC)   . Localized swelling of right lower extremity 04/12/2020  . Multiple gastric polyps 04/12/2020  . Poor responsiveness after MAC anesthesia 04/09/2020 04/12/2020  . Hematemesis with nausea 04/12/2020  . 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  . Left ventricular ejection fraction less than 20% 04/12/2020  . Acute acalculous cholecystitis 04/12/2020  . Mural thrombus of cardiac apex 04/12/2020  . Acute deep vein thrombosis (DVT) of right femoral vein ([place]) 04/12/2020  . Intractable nausea and vomiting 04/11/2020  . Weight loss, unintentional 04/06/2020  . Esophageal dysphagia 04/06/2020  . Abnormal LFTs 04/06/2020  . Depression, major, single episode, moderate ([place]) 03/02/2020  . Pancreatitis, acute 02/26/2020  . Hx of adenomatous colonic polyps   . Morbid obesity ([place]) 04/22/2015  . PCP NOTES >>>>>>>>>>>>>>>>>>>>>>>>> 04/22/2015  . Hyperglycemia 03/11/2014  . Erectile dysfunction 07/21/2011  . Annual physical exam 01/05/2011  . Gout 03/14/2006  . Essential hypertension 03/14/2006  . ALLERGIC RHINITIS 03/14/2006   Isabelle Course, PT  Kevin Baxter 05/07/2020, 11:46 AM  [facility] [place], [place], [address] Phone: [phone]   Fax:  [phone]  Name: Kevin Baxter MRN: [id number] Date of Birth: 1970/02/12

## 2020-05-07 NOTE — Telephone Encounter (Signed)
-----   Message from Jolaine Artist, MD sent at 05/05/2020  4:07 PM EDT ----- Stop torsemide. Repeat BMET 1 week

## 2020-05-10 ENCOUNTER — Encounter (HOSPITAL_COMMUNITY): Payer: Self-pay | Admitting: *Deleted

## 2020-05-10 NOTE — Progress Notes (Signed)
Pt's FMLA and disability forms along with FMLA forms for pt's wife all completed and signed by Dr Haroldine Laws and faxed, pt's wife aware and copies of all provided to her.  FMLA for pt's wife: [employer] [phone] fax  Pt's FMLA faxed to [place] [phone]  Pt's STD form faxed to [place] at [phone]

## 2020-05-11 ENCOUNTER — Other Ambulatory Visit: Payer: Self-pay

## 2020-05-11 ENCOUNTER — Ambulatory Visit (INDEPENDENT_AMBULATORY_CARE_PROVIDER_SITE_OTHER): Payer: 59 | Admitting: Physical Therapy

## 2020-05-11 DIAGNOSIS — M6281 Muscle weakness (generalized): Secondary | ICD-10-CM

## 2020-05-11 DIAGNOSIS — R6889 Other general symptoms and signs: Secondary | ICD-10-CM | POA: Diagnosis not present

## 2020-05-11 DIAGNOSIS — R2689 Other abnormalities of gait and mobility: Secondary | ICD-10-CM | POA: Diagnosis not present

## 2020-05-11 NOTE — Therapy (Signed)
[place], [place], [address] Phone: [phone]   Fax:  [phone]  Physical Therapy Treatment  Patient Details  Name: Kevin Baxter MRN: [id number] Date of Birth: 01-10-1971 Referring Provider (PT): Glori Bickers   Encounter Date: 05/11/2020   PT End of Session - 05/11/20 1113    Visit Number 4    Number of Visits 12    Date for PT Re-Evaluation 06/09/20    PT Start Time 1105    PT Stop Time 1139    PT Time Calculation (min) 34 min    Activity Tolerance Patient tolerated treatment well    Behavior During Therapy [facility] for tasks assessed/performed           Past Medical History:  Diagnosis Date  . Allergy   . Anxiety   . Arthritis   . CHF (congestive heart failure) ([place])   . Fundic gland polyposis of stomach   . GERD with esophagitis   . Gout   . Hx of adenomatous colonic polyps   . Hypertension     Past Surgical History:  Procedure Laterality Date  . COLONOSCOPY  05/2019  . ESOPHAGOGASTRODUODENOSCOPY  05/2019  . HAND SURGERY Right    2006 for a FX  . IR ANGIOGRAM PULMONARY BILATERAL SELECTIVE  04/16/2020  . IR ANGIOGRAM SELECTIVE EACH ADDITIONAL VESSEL  04/16/2020  . IR ANGIOGRAM SELECTIVE EACH ADDITIONAL VESSEL  04/16/2020  . IR THROMBECT PRIM MECH INIT (INCLU) MOD SED  04/16/2020  . IR US GUIDE VASC ACCESS LEFT  04/16/2020  . IR US GUIDE VASC ACCESS LEFT  04/16/2020  . IR US GUIDE VASC ACCESS RIGHT  04/16/2020  . RADIOLOGY WITH ANESTHESIA N/A 04/16/2020   Procedure: IR WITH ANESTHESIA- PULMONARY EMBOLUS INTERVENTION;  Surgeon: Suzette Battiest, MD;  Location: [facility];  Service: Radiology;  Laterality: N/A;  . RIGHT/LEFT HEART CATH AND CORONARY ANGIOGRAPHY N/A 04/15/2020   Procedure: RIGHT/LEFT HEART CATH AND CORONARY ANGIOGRAPHY;  Surgeon: Jolaine Artist, MD;  Location: [facility];  Service: Cardiovascular;  Laterality: N/A;    There were no vitals filed for this  visit.   Subjective Assessment - 05/11/20 1107    Subjective Pt reports he is no longer winded going up stairs or taking showers.  He can also walk to mailbox without feeling winded.    How long can you stand comfortably? 8-10 min    Patient Stated Goals improve strength and endurance    Currently in Pain? No/denies    Pain Score 0-No pain              [facility] PT Assessment - 05/11/20 0001      Assessment   Medical Diagnosis muscle weakness    Referring Provider (PT) Glori Bickers    Onset Date/Surgical Date 04/15/20      Transfers   Five time sit to stand comments  15.47    Comments 5x STS HR 110 bpm      Ambulation/Gait   Gait Comments TUG: 9.39            [facility] Adult PT Treatment/Exercise - 05/11/20 0001      Knee/Hip Exercises: Aerobic   Nustep L5: legs x 2.5 min, arms/legs, 4.5 min      Knee/Hip Exercises: Standing   Heel Raises Both;1 set;10 reps   and toe raises   Knee Flexion Strengthening;Right;Left;1 set;10 reps    Hip Extension Stengthening;Right;Left;1 set;10 reps;Knee straight  Knee/Hip Exercises: Seated   Sit to Sand 5 reps;without UE support   2 sets, 2nd set with slow controlled motion     Shoulder Exercises: Seated   Row Both;10 reps;Strengthening    Theraband Level (Shoulder Row) Level 1 (Yellow)    External Rotation Both;10 reps   2 sets   Theraband Level (Shoulder External Rotation) Level 1 (Yellow)                  PT Education - 05/11/20 1250    Education Details Added seated row and shoulder ER; issued yellow band    Person(s) Educated Patient    Methods Explanation;Verbal cues;Handout    Comprehension Verbalized understanding;Returned demonstration               PT Long Term Goals - 05/11/20 1139      PT LONG TERM GOAL #1   Title Pt will be independent with HEP    Time 6    Period Weeks    Status On-going      PT LONG TERM GOAL #2   Title Pt will improve 5x STS by 5 seconds to demo improved functional  strength    Time 6    Period Weeks    Status Achieved      PT LONG TERM GOAL #3   Title Pt will improve TUG to 15 seconds to demo improved functional mobility    Time 6    Period Weeks    Status Achieved                 Plan - 05/11/20 1245    Clinical Impression Statement Pt completed 5x STS and TUG with improved speed; has met these goals.  Pt given short seated rest breaks in between standing exercises.  Improved exercises tolerance each visit.    Rehab Potential Good    PT Frequency 2x / week    PT Duration 6 weeks    PT Treatment/Interventions ADLs/Self Care Home Management;Therapeutic activities;Therapeutic exercise;Balance training;Functional mobility training;Patient/family education;Gait training    PT Next Visit Plan progress cardiovascular conditioning and postural strength    PT Home Exercise Plan [id number]    Consulted and Agree with Plan of Care Patient           Patient will benefit from skilled therapeutic intervention in order to improve the following deficits and impairments:  Decreased balance,Decreased endurance,Decreased mobility,Cardiopulmonary status limiting activity,Decreased activity tolerance,Decreased strength  Visit Diagnosis: Decreased functional activity tolerance  Muscle weakness (generalized)  Other abnormalities of gait and mobility     Problem List Patient Active Problem List   Diagnosis Date Noted  . Protein-calorie malnutrition, severe 04/20/2020  . Congestive heart failure (CHF) ([place])   . Acute saddle pulmonary embolism with acute cor pulmonale (HCC)   . Localized swelling of right lower extremity 04/12/2020  . Multiple gastric polyps 04/12/2020  . Poor responsiveness after MAC anesthesia 04/09/2020 04/12/2020  . Hematemesis with nausea 04/12/2020  . 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  . Left ventricular ejection fraction less than 20% 04/12/2020  . Acute acalculous cholecystitis 04/12/2020  . Mural thrombus of  cardiac apex 04/12/2020  . Acute deep vein thrombosis (DVT) of right femoral vein ([place]) 04/12/2020  . Intractable nausea and vomiting 04/11/2020  . Weight loss, unintentional 04/06/2020  . Esophageal dysphagia 04/06/2020  . Abnormal LFTs 04/06/2020  . Depression, major, single episode, moderate ([place]) 03/02/2020  . Pancreatitis, acute 02/26/2020  . Hx of adenomatous colonic polyps   .  Morbid obesity ([place]) 04/22/2015  . PCP NOTES >>>>>>>>>>>>>>>>>>>>>>>>> 04/22/2015  . Hyperglycemia 03/11/2014  . Erectile dysfunction 07/21/2011  . Annual physical exam 01/05/2011  . Gout 03/14/2006  . Essential hypertension 03/14/2006  . ALLERGIC RHINITIS 03/14/2006   Kerin Perna, PTA 05/11/20 12:51 PM  [place], [place], [address] Phone: [phone]   Fax:  [phone]  Name: Kevin Baxter MRN: [id number] Date of Birth: February 02, 1970

## 2020-05-12 NOTE — Progress Notes (Signed)
Received fax from [employer] regarding pt's wife FMLA, form completed, signed and faxed back to them at [phone]

## 2020-05-13 ENCOUNTER — Ambulatory Visit (HOSPITAL_COMMUNITY)
Admission: RE | Admit: 2020-05-13 | Discharge: 2020-05-13 | Disposition: A | Discharge status: Home / Self Care | Payer: 59 | Source: Ambulatory Visit | Attending: Cardiology | Admitting: Cardiology

## 2020-05-13 ENCOUNTER — Ambulatory Visit (INDEPENDENT_AMBULATORY_CARE_PROVIDER_SITE_OTHER): Payer: 59 | Admitting: Physical Therapy

## 2020-05-13 ENCOUNTER — Other Ambulatory Visit: Payer: Self-pay

## 2020-05-13 ENCOUNTER — Encounter (HOSPITAL_COMMUNITY): Payer: 59 | Admitting: Internal Medicine

## 2020-05-13 DIAGNOSIS — R6889 Other general symptoms and signs: Secondary | ICD-10-CM

## 2020-05-13 DIAGNOSIS — M6281 Muscle weakness (generalized): Secondary | ICD-10-CM | POA: Diagnosis not present

## 2020-05-13 DIAGNOSIS — I5022 Chronic systolic (congestive) heart failure: Secondary | ICD-10-CM | POA: Diagnosis present

## 2020-05-13 DIAGNOSIS — R2689 Other abnormalities of gait and mobility: Secondary | ICD-10-CM | POA: Diagnosis not present

## 2020-05-13 LAB — BASIC METABOLIC PANEL
Anion gap: 7 (ref 5–15)
BUN: 17 mg/dL (ref 6–20)
CO2: 28 mmol/L (ref 22–32)
Calcium: 9.7 mg/dL (ref 8.9–10.3)
Chloride: 103 mmol/L (ref 98–111)
Creatinine, Ser: 1.06 mg/dL (ref 0.61–1.24)
GFR, Estimated: >60 mL/min (ref >60)
Glucose, Bld: 122 mg/dL — ABNORMAL HIGH (ref 70–99)
Potassium: 4.9 mmol/L (ref 3.5–5.1)
Sodium: 138 mmol/L (ref 135–145)

## 2020-05-13 NOTE — Therapy (Signed)
[place], [place], [address] Phone: [phone]   Fax:  [phone]  Physical Therapy Treatment  Patient Details  Name: Kevin Baxter MRN: [id number] Date of Birth: May 19, 1970 Referring Provider (PT): Glori Bickers   Encounter Date: 05/13/2020   PT End of Session - 05/13/20 1142    Visit Number 5    Number of Visits 12    Date for PT Re-Evaluation 06/09/20    PT Start Time 1100    PT Stop Time 1142    PT Time Calculation (min) 42 min    Activity Tolerance Patient tolerated treatment well    Behavior During Therapy [facility] for tasks assessed/performed           Past Medical History:  Diagnosis Date  . Allergy   . Anxiety   . Arthritis   . CHF (congestive heart failure) ([place])   . Fundic gland polyposis of stomach   . GERD with esophagitis   . Gout   . Hx of adenomatous colonic polyps   . Hypertension     Past Surgical History:  Procedure Laterality Date  . COLONOSCOPY  05/2019  . ESOPHAGOGASTRODUODENOSCOPY  05/2019  . HAND SURGERY Right    2006 for a FX  . IR ANGIOGRAM PULMONARY BILATERAL SELECTIVE  04/16/2020  . IR ANGIOGRAM SELECTIVE EACH ADDITIONAL VESSEL  04/16/2020  . IR ANGIOGRAM SELECTIVE EACH ADDITIONAL VESSEL  04/16/2020  . IR THROMBECT PRIM MECH INIT (INCLU) MOD SED  04/16/2020  . IR US GUIDE VASC ACCESS LEFT  04/16/2020  . IR US GUIDE VASC ACCESS LEFT  04/16/2020  . IR US GUIDE VASC ACCESS RIGHT  04/16/2020  . RADIOLOGY WITH ANESTHESIA N/A 04/16/2020   Procedure: IR WITH ANESTHESIA- PULMONARY EMBOLUS INTERVENTION;  Surgeon: Suzette Battiest, MD;  Location: [facility];  Service: Radiology;  Laterality: N/A;  . RIGHT/LEFT HEART CATH AND CORONARY ANGIOGRAPHY N/A 04/15/2020   Procedure: RIGHT/LEFT HEART CATH AND CORONARY ANGIOGRAPHY;  Surgeon: Jolaine Artist, MD;  Location: [place] [facility];  Service: Cardiovascular;  Laterality: N/A;    There were no vitals filed for this  visit.   Subjective Assessment - 05/13/20 1104    Subjective Pt states he continues to feel stronger every day.    Currently in Pain? No/denies                             [facility] Adult PT Treatment/Exercise - 05/13/20 0001      Knee/Hip Exercises: Aerobic   Tread Mill 2.15mh 372m x 2      Knee/Hip Exercises: Standing   Heel Raises Both;10 reps    Knee Flexion Strengthening;Both;2 sets;10 reps    Hip Abduction Both;2 sets;10 reps    Hip Extension Both;2 sets;10 reps    Lateral Step Up Both;10 reps;Hand Hold: 1;Step Height: 4"    Forward Step Up Both;Hand Hold: 2;Step Height: 4";10 reps      Knee/Hip Exercises: Seated   Sit to Sand 5 reps   focus on eccentric control     Shoulder Exercises: Seated   Row Both;20 reps    Theraband Level (Shoulder Row) Level 1 (Yellow)    External Rotation Both;20 reps    Theraband Level (Shoulder External Rotation) Level 1 (Yellow)                       PT Long Term Goals -  05/11/20 1139      PT LONG TERM GOAL #1   Title Pt will be independent with HEP    Time 6    Period Weeks    Status On-going      PT LONG TERM GOAL #2   Title Pt will improve 5x STS by 5 seconds to demo improved functional strength    Time 6    Period Weeks    Status Achieved      PT LONG TERM GOAL #3   Title Pt will improve TUG to 15 seconds to demo improved functional mobility    Time 6    Period Weeks    Status Achieved                 Plan - 05/13/20 1142    Clinical Impression Statement Pt able to increase reps of exercises today and walk on treadmill. Pt continues to require short seated rest breaks between activities but is improving strength and endurance    PT Next Visit Plan progress cardiovascular conditioning and postural strength    PT Home Exercise Plan [id number]           Patient will benefit from skilled therapeutic intervention in order to improve the following deficits and impairments:     Visit  Diagnosis: Decreased functional activity tolerance  Muscle weakness (generalized)  Other abnormalities of gait and mobility     Problem List Patient Active Problem List   Diagnosis Date Noted  . Protein-calorie malnutrition, severe 04/20/2020  . Congestive heart failure (CHF) ([place])   . Acute saddle pulmonary embolism with acute cor pulmonale (HCC)   . Localized swelling of right lower extremity 04/12/2020  . Multiple gastric polyps 04/12/2020  . Poor responsiveness after MAC anesthesia 04/09/2020 04/12/2020  . Hematemesis with nausea 04/12/2020  . 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  . Left ventricular ejection fraction less than 20% 04/12/2020  . Acute acalculous cholecystitis 04/12/2020  . Mural thrombus of cardiac apex 04/12/2020  . Acute deep vein thrombosis (DVT) of right femoral vein ([place]) 04/12/2020  . Intractable nausea and vomiting 04/11/2020  . Weight loss, unintentional 04/06/2020  . Esophageal dysphagia 04/06/2020  . Abnormal LFTs 04/06/2020  . Depression, major, single episode, moderate ([place]) 03/02/2020  . Pancreatitis, acute 02/26/2020  . Hx of adenomatous colonic polyps   . Morbid obesity ([place]) 04/22/2015  . PCP NOTES >>>>>>>>>>>>>>>>>>>>>>>>> 04/22/2015  . Hyperglycemia 03/11/2014  . Erectile dysfunction 07/21/2011  . Annual physical exam 01/05/2011  . Gout 03/14/2006  . Essential hypertension 03/14/2006  . ALLERGIC RHINITIS 03/14/2006   Isabelle Course, PT  Candise Crabtree 05/13/2020, 11:43 AM  [facility] [place], [place], [address] Phone: [phone]   Fax:  [phone]  Name: Kevin Baxter MRN: [id number] Date of Birth: 1970/05/26

## 2020-05-16 ENCOUNTER — Other Ambulatory Visit: Payer: Self-pay | Admitting: Physician Assistant

## 2020-05-16 ENCOUNTER — Telehealth: Payer: Self-pay | Admitting: Internal Medicine

## 2020-05-16 NOTE — Telephone Encounter (Signed)
Patient was doing yard work around [employer]. He finished around [phone] and that time noticed headache and what felt like palpitations. He took one SLTNG just in case. At present, his heart rate is 95 bpm and his BP is 120/70. He thinks that he may be dehydrated and is currently drinking some fluids. He otherwise denies any associated chest pain, exertional chest pressure/discomfort, dyspnea/tachypnea, paroxysmal nocturnal dyspnea/orthopnea, presyncope/syncope, lower extremity edema, or abdominal distention. ED precautions given. Patient understands plan as stated.

## 2020-05-17 ENCOUNTER — Telehealth (INDEPENDENT_AMBULATORY_CARE_PROVIDER_SITE_OTHER): Payer: 59 | Admitting: Psychiatry

## 2020-05-17 ENCOUNTER — Encounter (HOSPITAL_COMMUNITY): Payer: Self-pay | Admitting: Psychiatry

## 2020-05-17 DIAGNOSIS — F4323 Adjustment disorder with mixed anxiety and depressed mood: Secondary | ICD-10-CM

## 2020-05-17 DIAGNOSIS — F064 Anxiety disorder due to known physiological condition: Secondary | ICD-10-CM | POA: Diagnosis not present

## 2020-05-17 MED ORDER — SERTRALINE HCL 50 MG PO TABS: 50.0000 mg | ORAL_TABLET | Freq: Every day | ORAL | 0 refills | Status: DC

## 2020-05-17 NOTE — Progress Notes (Signed)
Psychiatric Initial Adult Assessment   Patient Identification: Kevin Baxter MRN:  [id number] Date of Evaluation:  05/17/2020 Referral Source: primary care Chief Complaint:  establish care, anxiety  Visit Diagnosis:    ICD-10-CM   1. Adjustment disorder with mixed anxiety and depressed mood  F43.23   2. Anxiety disorder due to known physiological condition  F06.4   Virtual Visit via Video Note  I connected with Kevin Baxter on 05/17/20 at 11:00 AM EDT by a video enabled telemedicine application and verified that I am speaking with the correct person using two identifiers.  Location: Patient: home Provider: home office   I discussed the limitations of evaluation and management by telemedicine and the availability of in person appointments. The patient expressed understanding and agreed to proceed.     I discussed the assessment and treatment plan with the patient. The patient was provided an opportunity to ask questions and all were answered. The patient agreed with the plan and demonstrated an understanding of the instructions.   The patient was advised to call back or seek an in-person evaluation if the symptoms worsen or if the condition fails to improve as anticipated.  I provided 30  minutes of non-face-to-face time during this encounter.    History of Present Illness:   Patient is a 50 years old currently married African-American male referred by primary care physician or therapist to establish care for anxiety.  He works full-time as an Administrator at [employer] but currently he is on disability because of his current medical comorbidity  Patient was diagnosed with congestive heart failure a month ago he was in the hospital during the hospital stay he developed lung clots.  Surgery was done to take care of it.  After that he has developed anxiety off fear of dying  Prior to that and during the process of being diagnosed with congestive heart failure related to some viral  infection he has had depressive symptoms of tiredness nausea and was diagnosed with possible depression he denies he is being depressed or feeling hopeless he is more concerned about anxiety and if medical comorbidity it affects his sleep he starts worrying about it and worries about that at nighttime during the daytime is fine he keeps himself distracted and is busy with family members or other activities to keep him from distracted he is currently on disability because of medical comorbidity congestive heart failure Patient has difficulty falling asleep because of his mind racing and worries about his physical health and morbidity he is working in therapy to distract and that has helped some but does do okay during the daytime when he wakes up and keeps himself involved or distracted  No other panic symptoms no other specific worries except the above   He was working from home for the last 2 years  He has good support from wife and family  Denies psychotic symptoms delusions or hallucinations No manic symptoms  Denies drug use  Aggravating factor: medical co morbidity, diagnosed with [facility] and blood clots while in hospital  Modifying factor: wife, kids,   Duration since march 2022  Past Psychiatric History: denies prior to current medical condition  Previous Psychotropic Medications: Yes   Substance Abuse History in the last 12 months:  No.  Consequences of Substance Abuse: NA  Past Medical History:  Past Medical History:  Diagnosis Date  . Allergy   . Anxiety   . Arthritis   . CHF (congestive heart failure) ([place])   .  Fundic gland polyposis of stomach   . GERD with esophagitis   . Gout   . Hx of adenomatous colonic polyps   . Hypertension     Past Surgical History:  Procedure Laterality Date  . COLONOSCOPY  05/2019  . ESOPHAGOGASTRODUODENOSCOPY  05/2019  . HAND SURGERY Right    2006 for a FX  . IR ANGIOGRAM PULMONARY BILATERAL SELECTIVE  04/16/2020  . IR ANGIOGRAM  SELECTIVE EACH ADDITIONAL VESSEL  04/16/2020  . IR ANGIOGRAM SELECTIVE EACH ADDITIONAL VESSEL  04/16/2020  . IR THROMBECT PRIM MECH INIT (INCLU) MOD SED  04/16/2020  . IR US GUIDE VASC ACCESS LEFT  04/16/2020  . IR US GUIDE VASC ACCESS LEFT  04/16/2020  . IR US GUIDE VASC ACCESS RIGHT  04/16/2020  . RADIOLOGY WITH ANESTHESIA N/A 04/16/2020   Procedure: IR WITH ANESTHESIA- PULMONARY EMBOLUS INTERVENTION;  Surgeon: Suzette Battiest, MD;  Location: [facility];  Service: Radiology;  Laterality: N/A;  . RIGHT/LEFT HEART CATH AND CORONARY ANGIOGRAPHY N/A 04/15/2020   Procedure: RIGHT/LEFT HEART CATH AND CORONARY ANGIOGRAPHY;  Surgeon: Jolaine Artist, MD;  Location: [facility];  Service: Cardiovascular;  Laterality: N/A;    Family Psychiatric History: Nephew: schizophrenia  Family History:  Family History  Problem Relation Age of Onset  . Cancer Mother        CERVICAL  . Hypertension Father   . Heart failure Father        no MI, d/t etoh  . Diabetes Sister   . Diabetes Sister   . Diabetes Paternal Aunt   . Diabetes Cousin   . Colon cancer Neg Hx   . Prostate cancer Neg Hx   . Stroke Neg Hx   . Esophageal cancer Neg Hx   . Rectal cancer Neg Hx   . Stomach cancer Neg Hx     Social History:   Social History   Socioeconomic History  . Marital status: Married    Spouse name: Not on file  . Number of children: 2  . Years of education: Not on file  . Highest education level: Not on file  Occupational History  . Occupation: works for [employer]  . Smoking status: Never Smoker  . Smokeless tobacco: Never Used  Vaping Use  . Vaping Use: Never used  Substance and Sexual Activity  . Alcohol use: Yes    Comment: very rare   . Drug use: No  . Sexual activity: Not on file  Other Topics Concern  . Not on file  Social History Narrative   Household: pt, wife daughter (2013),  boy  (2006),    Social Determinants of Radio broadcast assistant Strain: Not on file  Food  Insecurity: Not on file  Transportation Needs: Not on file  Physical Activity: Not on file  Stress: Not on file  Social Connections: Not on file    Additional Social History: grew up with parents, no trauma. Married for 20 years has 2 kids  Allergies:   Allergies  Allergen Reactions  . Pseudoephedrine     REACTION: jittery    Metabolic Disorder Labs: Lab Results  Component Value Date   HGBA1C 6.3 (H) 04/11/2020   MPG 134.11 04/11/2020   No results found for: PROLACTIN Lab Results  Component Value Date   CHOL 188 12/03/2018   TRIG 60 02/27/2020   HDL 50.10 12/03/2018   CHOLHDL 4 12/03/2018   VLDL 29.8 12/03/2018   LDLCALC 108 (H) 12/03/2018  LDLCALC 113 (H) 01/01/2017   Lab Results  Component Value Date   TSH 2.246 04/12/2020    Therapeutic Level Labs: No results found for: LITHIUM No results found for: CBMZ No results found for: VALPROATE  Current Medications: Current Outpatient Medications  Medication Sig Dispense Refill  . sertraline (ZOLOFT) 50 MG tablet Take 1 tablet (50 mg total) by mouth daily. 30 tablet 0  . allopurinol (ZYLOPRIM) 300 MG tablet Take 1 tablet (300 mg total) by mouth daily. 30 tablet 0  . apixaban (ELIQUIS) 5 MG TABS tablet Take 5 mg by mouth 2 (two) times daily.    Marland Kitchen dicyclomine (BENTYL) 20 MG tablet Take 1 tablet (20 mg total) by mouth every 6 (six) hours as needed for spasms. 90 tablet 0  . digoxin (LANOXIN) 0.125 MG tablet Take 1 tablet (0.125 mg total) by mouth daily. 30 tablet 6  . ivabradine (CORLANOR) 5 MG TABS tablet Take 1.5 tablets (7.5 mg total) by mouth 2 (two) times daily with a meal. 90 tablet 6  . ondansetron (ZOFRAN ODT) 4 MG disintegrating tablet Take 1 tablet (4 mg total) by mouth every 8 (eight) hours as needed for nausea or vomiting. 20 tablet 0  . pantoprazole (PROTONIX) 40 MG tablet Take 1 tablet (40 mg total) by mouth daily. 30 tablet 3  . sacubitril-valsartan (ENTRESTO) 24-26 MG Take 1 tablet by mouth 2 (two) times  daily. 60 tablet 6  . spironolactone (ALDACTONE) 25 MG tablet Take 0.5 tablets (12.5 mg total) by mouth daily. 30 tablet 6  . torsemide (DEMADEX) 20 MG tablet Take 1 tablet (20 mg total) by mouth daily as needed. 30 tablet 6   No current facility-administered medications for this visit.     Psychiatric Specialty Exam: Review of Systems  Cardiovascular: Negative for chest pain.  Psychiatric/Behavioral: Positive for sleep disturbance. The patient is nervous/anxious.     There were no vitals taken for this visit.There is no height or weight on file to calculate BMI.  General Appearance: Casual  Eye Contact:  Fair  Speech:  Normal Rate  Volume:  Normal  Mood:  Euthymic  Affect:  Congruent  Thought Process:  Goal Directed  Orientation:  Full (Time, Place, and Person)  Thought Content:  Rumination  Suicidal Thoughts:  No  Homicidal Thoughts:  No  Memory:  Immediate;   Fair Recent;   Fair  Judgement:  Fair  Insight:  Fair  Psychomotor Activity:  Normal  Concentration:  Concentration: Fair and Attention Span: Fair  Recall:  Good  Fund of Knowledge:Good  Language: Fair  Akathisia:  No  Handed:  AIMS (if indicated):  not done  Assets:  Communication Skills Desire for Improvement  ADL's:  Intact  Cognition: WNL  Sleep:  variable   Screenings: PHQ2-9   Flowsheet Row Video Visit from 05/17/2020 in [facility] from 05/04/2020 in [facility] Office Visit from 03/02/2020 in [facility] at [place] Visit from 05/22/2019 in [facility] at [place] Visit from 04/03/2018 in [facility] at [place]  PHQ-2 Total Score 1 2 6  0 0  PHQ-9 Total Score 6 10 23  -- --    Flowsheet Row Video Visit from 05/17/2020 in [facility] from 05/04/2020 in [facility] ED to Hosp-Admission (Discharged) from 04/11/2020 in [facility]  C-SSRS RISK CATEGORY No Risk No Risk No Risk      Assessment and Plan: as follows   Adjustment disorder with depressed mood and anxiety: depression improved, still has anxiety mostly at night, continue therapy, will start low dose zoloft  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

## 2020-05-18 ENCOUNTER — Ambulatory Visit (INDEPENDENT_AMBULATORY_CARE_PROVIDER_SITE_OTHER): Payer: 59 | Admitting: Physical Therapy

## 2020-05-18 ENCOUNTER — Encounter: Payer: Self-pay | Admitting: Physical Therapy

## 2020-05-18 ENCOUNTER — Other Ambulatory Visit: Payer: Self-pay

## 2020-05-18 ENCOUNTER — Telehealth: Payer: Self-pay | Admitting: Physician Assistant

## 2020-05-18 VITALS — BP 101/72 | HR 56

## 2020-05-18 DIAGNOSIS — M6281 Muscle weakness (generalized): Secondary | ICD-10-CM

## 2020-05-18 DIAGNOSIS — R2689 Other abnormalities of gait and mobility: Secondary | ICD-10-CM | POA: Diagnosis not present

## 2020-05-18 DIAGNOSIS — R6889 Other general symptoms and signs: Secondary | ICD-10-CM | POA: Diagnosis not present

## 2020-05-18 NOTE — Therapy (Signed)
[place], [place], [address] Phone: [phone]   Fax:  [phone]  Physical Therapy Treatment  Patient Details  Name: Kevin Baxter MRN: [id number] Date of Birth: 1970/05/06 Referring Provider (PT): Glori Bickers   Encounter Date: 05/18/2020   PT End of Session - 05/18/20 0926    Visit Number 6    Number of Visits 12    Date for PT Re-Evaluation 06/09/20    PT Start Time 0927    PT Stop Time 1014    PT Time Calculation (min) 47 min    Activity Tolerance Patient tolerated treatment well    Behavior During Therapy [facility] for tasks assessed/performed           Past Medical History:  Diagnosis Date  . Allergy   . Anxiety   . Arthritis   . CHF (congestive heart failure) ([place])   . Fundic gland polyposis of stomach   . GERD with esophagitis   . Gout   . Hx of adenomatous colonic polyps   . Hypertension     Past Surgical History:  Procedure Laterality Date  . COLONOSCOPY  05/2019  . ESOPHAGOGASTRODUODENOSCOPY  05/2019  . HAND SURGERY Right    2006 for a FX  . IR ANGIOGRAM PULMONARY BILATERAL SELECTIVE  04/16/2020  . IR ANGIOGRAM SELECTIVE EACH ADDITIONAL VESSEL  04/16/2020  . IR ANGIOGRAM SELECTIVE EACH ADDITIONAL VESSEL  04/16/2020  . IR THROMBECT PRIM MECH INIT (INCLU) MOD SED  04/16/2020  . IR US GUIDE VASC ACCESS LEFT  04/16/2020  . IR US GUIDE VASC ACCESS LEFT  04/16/2020  . IR US GUIDE VASC ACCESS RIGHT  04/16/2020  . RADIOLOGY WITH ANESTHESIA N/A 04/16/2020   Procedure: IR WITH ANESTHESIA- PULMONARY EMBOLUS INTERVENTION;  Surgeon: Suzette Battiest, MD;  Location: [facility];  Service: Radiology;  Laterality: N/A;  . RIGHT/LEFT HEART CATH AND CORONARY ANGIOGRAPHY N/A 04/15/2020   Procedure: RIGHT/LEFT HEART CATH AND CORONARY ANGIOGRAPHY;  Surgeon: Jolaine Artist, MD;  Location: [facility];  Service: Cardiovascular;  Laterality: N/A;    Vitals:   05/18/20 1013  BP: 101/72   Pulse: (!) 56     Subjective Assessment - 05/18/20 0927    Subjective Patient reports he's still doing better    Limitations Walking;Standing;House hold activities    How long can you stand comfortably? 8-10 min    How long can you walk comfortably? 5 minutes    Patient Stated Goals improve strength and endurance    Currently in Pain? No/denies                             [facility] Adult PT Treatment/Exercise - 05/18/20 0001      Transfers   Five time sit to stand comments  2x5    Comments 119 HR x 5 and after 2nd set      Knee/Hip Exercises: Aerobic   Tread Mill 2.0 mph x    Other Aerobic UBE L2 x 2 min      Knee/Hip Exercises: Machines for Strengthening   Cybex Leg Press 50#, 60# 2x10      Knee/Hip Exercises: Standing   Heel Raises Both;10 reps    Knee Flexion Strengthening;Both;2 sets;10 reps    Hip Abduction Both;2 sets;10 reps    Hip Extension Both;2 sets;10 reps    Lateral Step Up Both;10 reps;Step Height: 4"    Forward Step  Up Both;Step Height: 4";10 reps;Hand Hold: 0   HR up to 130 after   Other Standing Knee Exercises high knees walking 2x71f; then with 3# wts in each hand 2x30 ft   HR 130 after   Other Standing Knee Exercises step ups on bosu ithe opposite knee flex x 10 ea      Knee/Hip Exercises: Seated   Sit to Sand 2 sets;5 reps      Shoulder Exercises: Seated   Row Both;20 reps    Theraband Level (Shoulder Row) Level 1 (Yellow)    Horizontal ABduction Both;20 reps    Theraband Level (Shoulder Horizontal ABduction) Level 1 (Yellow)    External Rotation Both;20 reps    Theraband Level (Shoulder External Rotation) Level 1 (Yellow)                       PT Long Term Goals - 05/11/20 1139      PT LONG TERM GOAL #1   Title Pt will be independent with HEP    Time 6    Period Weeks    Status On-going      PT LONG TERM GOAL #2   Title Pt will improve 5x STS by 5 seconds to demo improved functional strength    Time 6     Period Weeks    Status Achieved      PT LONG TERM GOAL #3   Title Pt will improve TUG to 15 seconds to demo improved functional mobility    Time 6    Period Weeks    Status Achieved                 Plan - 05/18/20 1701    Clinical Impression Statement Patient tolerated increased aerobic activity on the TM and UBE today. He tolerated step ups on bosu well but this pushed his pulse up over 150 bpm. Short rest breaks bring HR down quickly. He is progressing well with strength and endurance.    PT Frequency 2x / week    PT Duration 6 weeks    PT Treatment/Interventions ADLs/Self Care Home Management;Therapeutic activities;Therapeutic exercise;Balance training;Functional mobility training;Patient/family education;Gait training    PT Next Visit Plan progress cardiovascular conditioning and postural strength    PT Home Exercise Plan [email or web address]    Consulted and Agree with Plan of Care Patient           Patient will benefit from skilled therapeutic intervention in order to improve the following deficits and impairments:  Decreased balance,Decreased endurance,Decreased mobility,Cardiopulmonary status limiting activity,Decreased activity tolerance,Decreased strength  Visit Diagnosis: Decreased functional activity tolerance  Muscle weakness (generalized)  Other abnormalities of gait and mobility     Problem List Patient Active Problem List   Diagnosis Date Noted  . Protein-calorie malnutrition, severe 04/20/2020  . Congestive heart failure (CHF) ([place]   . Acute saddle pulmonary embolism with acute cor pulmonale (HCC)   . Localized swelling of right lower extremity 04/12/2020  . Multiple gastric polyps 04/12/2020  . Poor responsiveness after MAC anesthesia 04/09/2020 04/12/2020  . Hematemesis with nausea 04/12/2020  . 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  . Left ventricular ejection fraction less than 20% 04/12/2020  . Acute acalculous cholecystitis 04/12/2020  . Mural  thrombus of cardiac apex 04/12/2020  . Acute deep vein thrombosis (DVT) of right femoral vein ([place] 04/12/2020  . Intractable nausea and vomiting 04/11/2020  . Weight loss, unintentional 04/06/2020  . Esophageal dysphagia 04/06/2020  . Abnormal LFTs  04/06/2020  . Depression, major, single episode, moderate ([place]) 03/02/2020  . Pancreatitis, acute 02/26/2020  . Hx of adenomatous colonic polyps   . Morbid obesity ([place]) 04/22/2015  . PCP NOTES >>>>>>>>>>>>>>>>>>>>>>>>> 04/22/2015  . Hyperglycemia 03/11/2014  . Erectile dysfunction 07/21/2011  . Annual physical exam 01/05/2011  . Gout 03/14/2006  . Essential hypertension 03/14/2006  . ALLERGIC RHINITIS 03/14/2006    Madelyn Flavors PT 05/18/2020, 5:06 PM  [facility] [place], [place], [address] Phone: [phone]   Fax:  [phone]  Name: Kevin Baxter MRN: [id number] Date of Birth: 1970/08/24

## 2020-05-18 NOTE — Telephone Encounter (Signed)
    Pt called because he is concerned about his condition.   He has gained a few pounds and is worried about gaining fluid.  He is having some LE edema.   He feels anxious about everything. He has had palpitations, but those seemed to improve after belching.  Discussed need for Na restriction, especially in commercially prepared food (he has had some recently).  Discussed need to limit all liquids to 2 qt/ltr daily.  Was taken off torsemide w/ subsequent normalization of Cr.  Suggested he take the torsemide about once a week, to make sure to keep his weight at his dry wt. Eat food w/ K+ when he takes the torsemide.  Feel his dry wt is 260 lbs, make that his goal for now.  Pt in agreement, will keep f/u appt.   Rosaria Ferries, PA-C 05/18/2020 6:35 PM

## 2020-05-19 ENCOUNTER — Encounter (HOSPITAL_COMMUNITY): Payer: Self-pay

## 2020-05-19 ENCOUNTER — Emergency Department (HOSPITAL_COMMUNITY): Payer: 59

## 2020-05-19 ENCOUNTER — Telehealth: Payer: Self-pay | Admitting: Physician Assistant

## 2020-05-19 ENCOUNTER — Emergency Department (HOSPITAL_COMMUNITY)
Admission: EM | Admit: 2020-05-19 | Discharge: 2020-05-20 | Disposition: A | Discharge status: Home / Self Care | Payer: 59 | Attending: Emergency Medicine | Admitting: Emergency Medicine

## 2020-05-19 ENCOUNTER — Other Ambulatory Visit: Payer: Self-pay

## 2020-05-19 DIAGNOSIS — I509 Heart failure, unspecified: Secondary | ICD-10-CM | POA: Diagnosis not present

## 2020-05-19 DIAGNOSIS — R002 Palpitations: Secondary | ICD-10-CM | POA: Diagnosis present

## 2020-05-19 DIAGNOSIS — R112 Nausea with vomiting, unspecified: Secondary | ICD-10-CM | POA: Insufficient documentation

## 2020-05-19 DIAGNOSIS — I110 Hypertensive heart disease with heart failure: Secondary | ICD-10-CM | POA: Diagnosis not present

## 2020-05-19 DIAGNOSIS — E612 Magnesium deficiency: Secondary | ICD-10-CM | POA: Diagnosis not present

## 2020-05-19 DIAGNOSIS — Z9861 Coronary angioplasty status: Secondary | ICD-10-CM | POA: Diagnosis not present

## 2020-05-19 DIAGNOSIS — R7989 Other specified abnormal findings of blood chemistry: Secondary | ICD-10-CM | POA: Diagnosis not present

## 2020-05-19 DIAGNOSIS — R110 Nausea: Secondary | ICD-10-CM

## 2020-05-19 LAB — BASIC METABOLIC PANEL
Anion gap: 9 (ref 5–15)
BUN: 11 mg/dL (ref 6–20)
CO2: 25 mmol/L (ref 22–32)
Calcium: 9.5 mg/dL (ref 8.9–10.3)
Chloride: 101 mmol/L (ref 98–111)
Creatinine, Ser: 1.26 mg/dL — ABNORMAL HIGH (ref 0.61–1.24)
GFR, Estimated: >60 mL/min (ref >60)
Glucose, Bld: 111 mg/dL — ABNORMAL HIGH (ref 70–99)
Potassium: 4.2 mmol/L (ref 3.5–5.1)
Sodium: 135 mmol/L (ref 135–145)

## 2020-05-19 LAB — CBC WITH DIFFERENTIAL/PLATELET
Abs Immature Granulocytes: 0.02 10*3/uL (ref 0.00–0.07)
Basophils Absolute: 0 10*3/uL (ref 0.0–0.1)
Basophils Relative: 0 %
Eosinophils Absolute: 0.1 10*3/uL (ref 0.0–0.5)
Eosinophils Relative: 1 %
HCT: 45.1 % (ref 39.0–52.0)
Hemoglobin: 14 g/dL (ref 13.0–17.0)
Immature Granulocytes: 0 %
Lymphocytes Relative: 29 %
Lymphs Abs: 1.8 10*3/uL (ref 0.7–4.0)
MCH: 25.1 pg — ABNORMAL LOW (ref 26.0–34.0)
MCHC: 31 g/dL (ref 30.0–36.0)
MCV: 81 fL (ref 80.0–100.0)
Monocytes Absolute: 0.6 10*3/uL (ref 0.1–1.0)
Monocytes Relative: 9 %
Neutro Abs: 3.8 10*3/uL (ref 1.7–7.7)
Neutrophils Relative %: 61 %
Platelets: 242 10*3/uL (ref 150–400)
RBC: 5.57 MIL/uL (ref 4.22–5.81)
RDW: 19.2 % — ABNORMAL HIGH (ref 11.5–15.5)
WBC: 6.2 10*3/uL (ref 4.0–10.5)
nRBC: 0 % (ref 0.0–0.2)

## 2020-05-19 LAB — TROPONIN I (HIGH SENSITIVITY)
Troponin I (High Sensitivity): 11 ng/L (ref <18)
Troponin I (High Sensitivity): 15 ng/L (ref <18)

## 2020-05-19 LAB — MAGNESIUM: Magnesium: 1.6 mg/dL — ABNORMAL LOW (ref 1.7–2.4)

## 2020-05-19 LAB — BRAIN NATRIURETIC PEPTIDE: B Natriuretic Peptide: 201.4 pg/mL — ABNORMAL HIGH (ref 0.0–100.0)

## 2020-05-19 IMAGING — CR DG CHEST 2V
2 series · 2 of 2 positions shown · non-contrast
Comparison: [DATE]

CLINICAL DATA: Cardiac palpitations

EXAM:
CHEST - 2 VIEW

[chest lat]
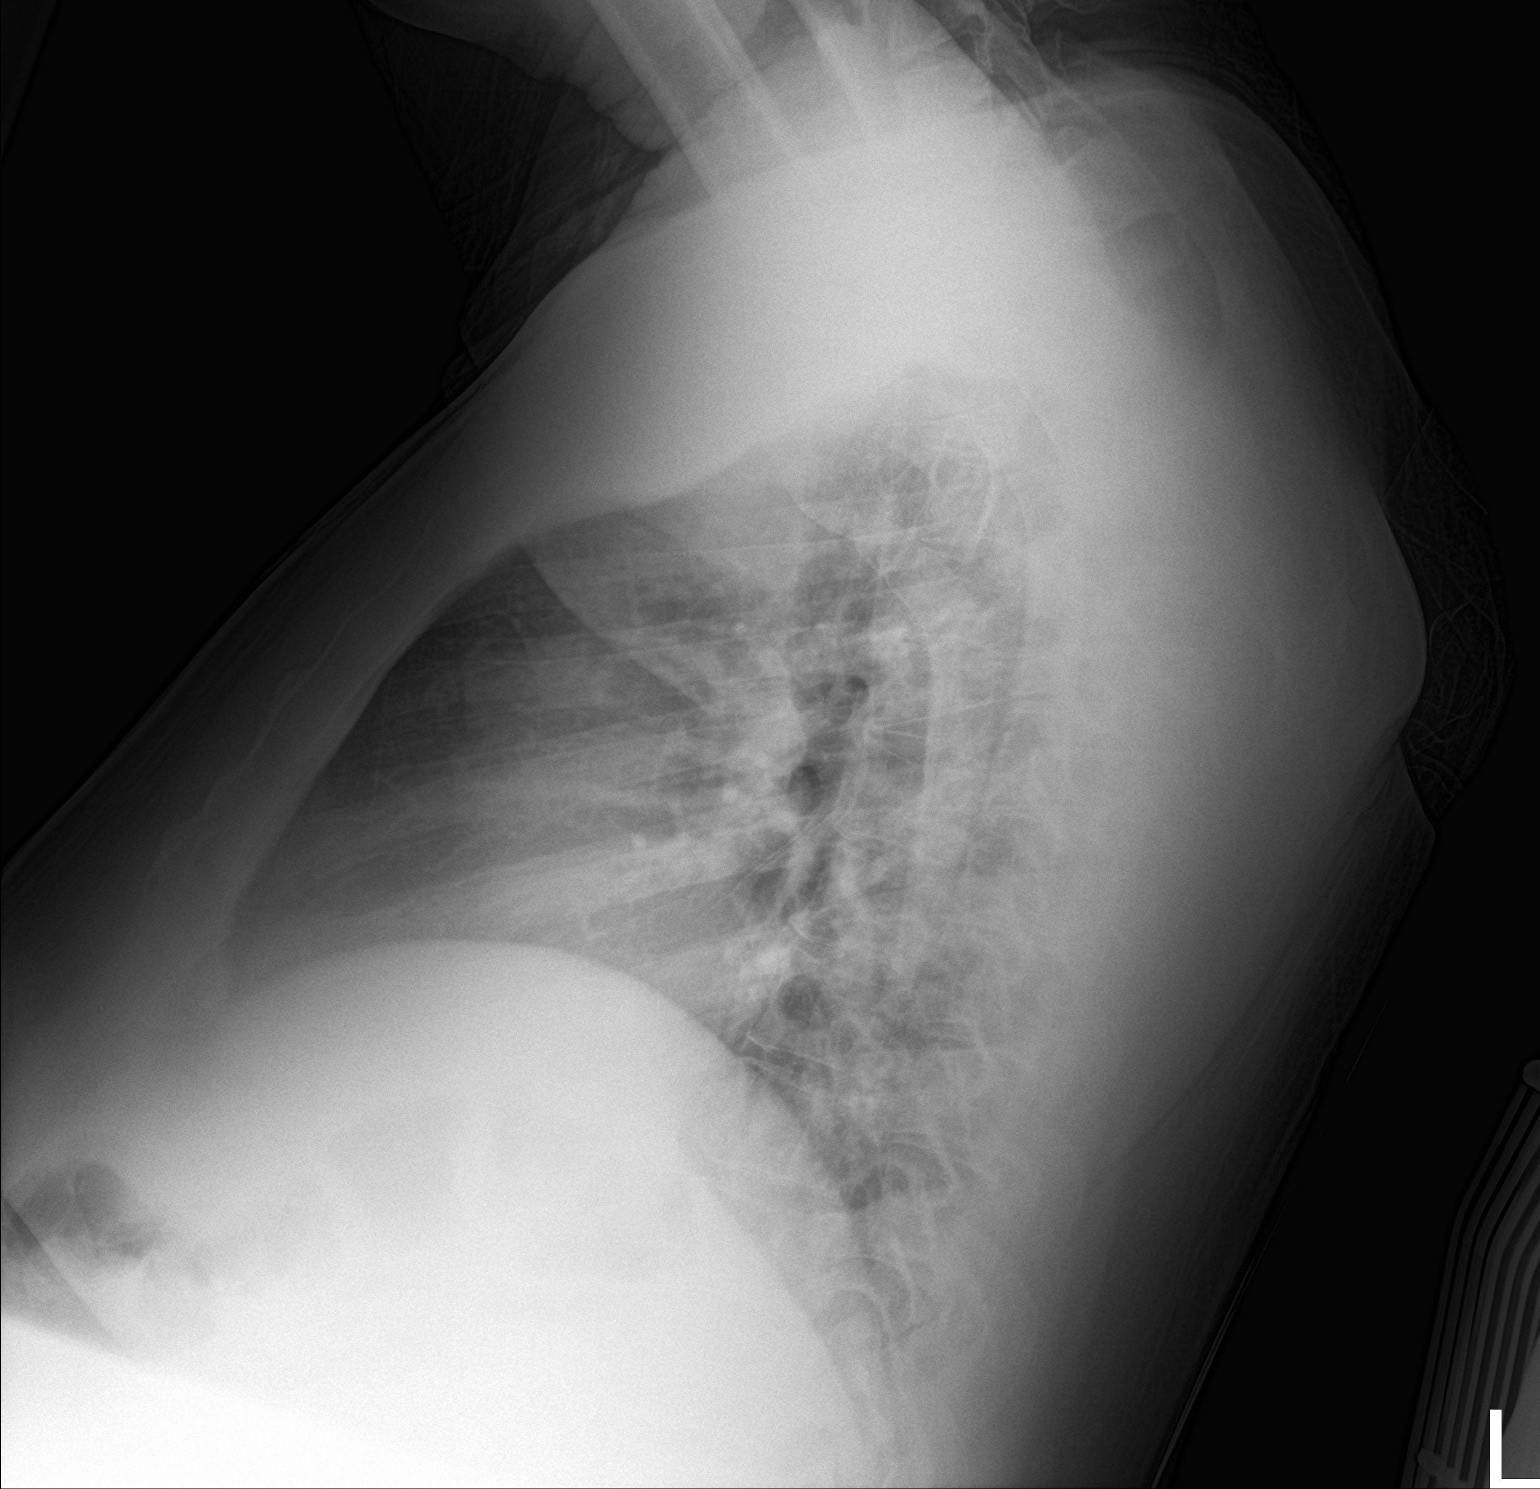

[chest ap]
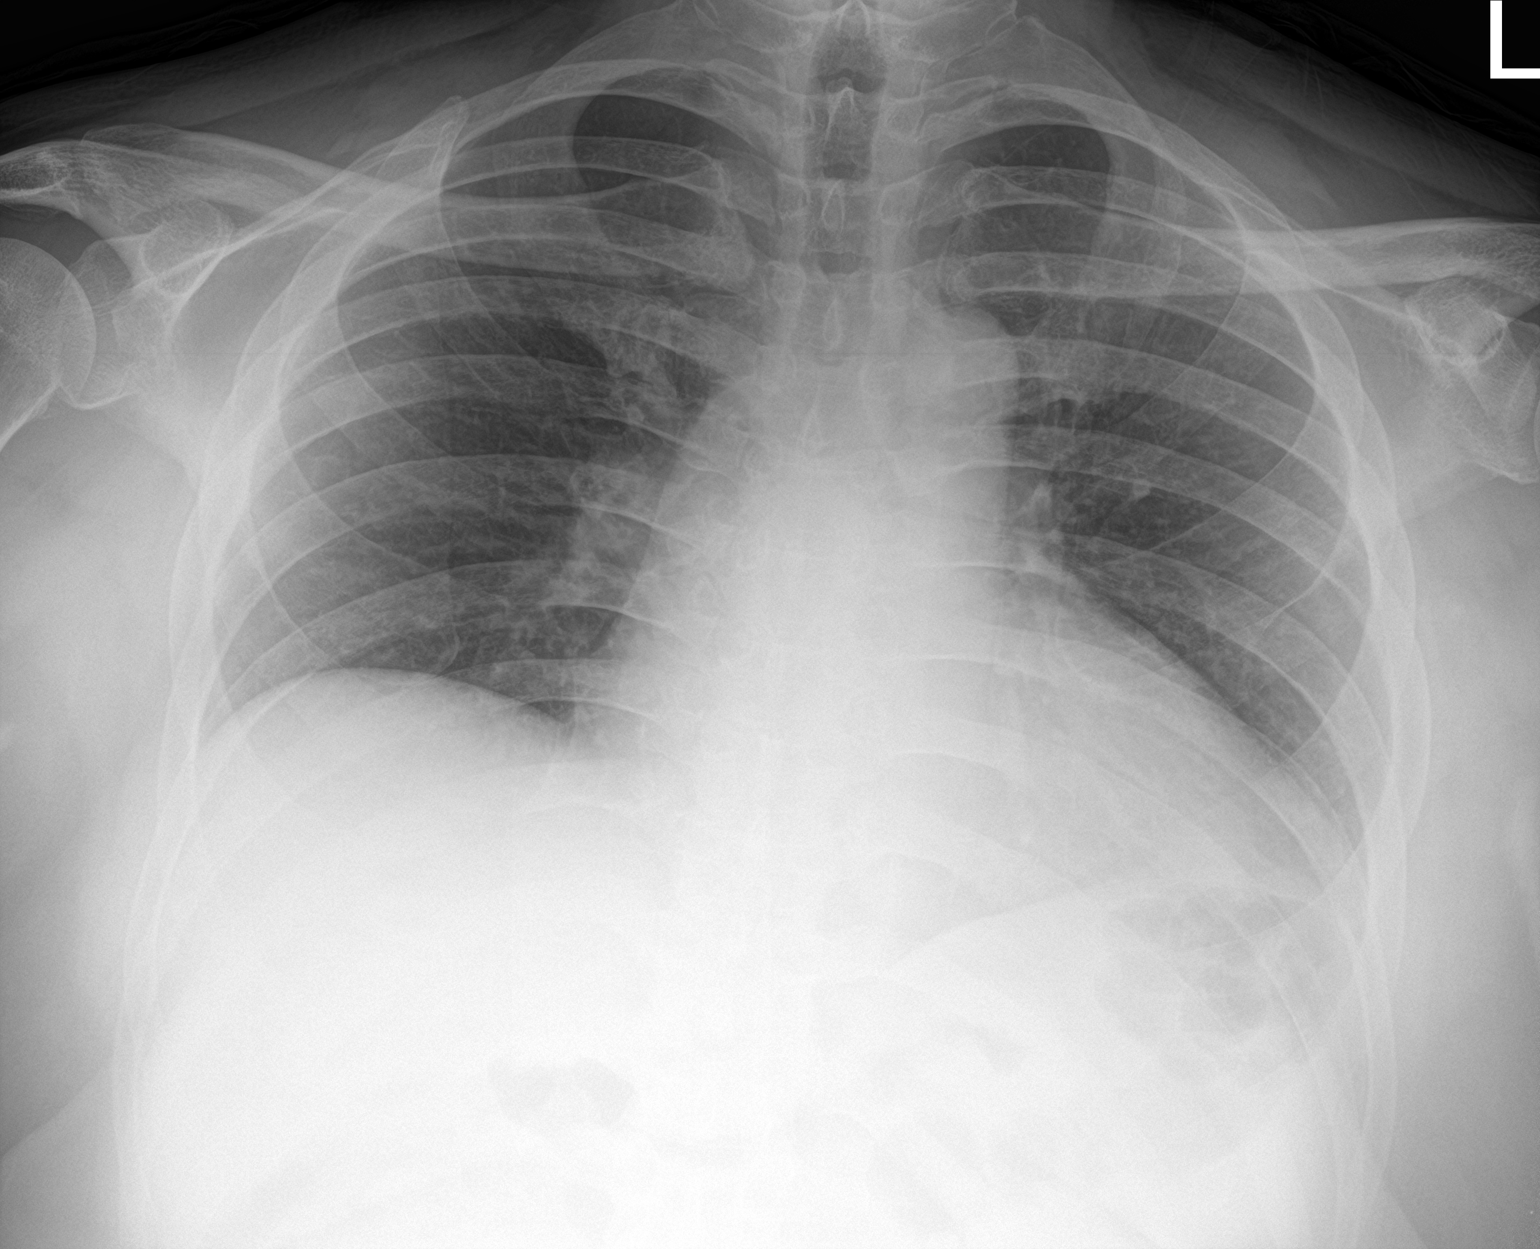

[2 of 2 positions shown; findings below may reference images not displayed]

FINDINGS: Cardiac shadow is enlarged. Lungs are well aerated bilaterally.
Previously seen right-sided PICC line is been removed. No effusion
is seen. No bony abnormality is noted.
IMPRESSION: No active cardiopulmonary disease.

## 2020-05-19 NOTE — Telephone Encounter (Signed)
   The patient/wife called the answering service after-hours today. Chart reviewed from last CHF note, also reviewed last phone note. The patient's wife states he has had more nausea and vomiting today, spitting up some foam, increased heart palpitations, fatigue and just doesn't feel well. Given his clinical history with recent very poor LV function and cardiogenic shock I recommended he proceed to the ER for evaluation - very difficult to evaluate fully and safely over the phone. The patient and wife verbalized understanding and gratitude and agree to proceed to [facility].  Charlie Pitter, PA-C

## 2020-05-19 NOTE — ED Triage Notes (Signed)
Emergency Medicine Provider Triage Evaluation Note  Kevin Baxter , a 50 y.o. male  was evaluated in triage.  Pt complains of palpitations.  Patient reports for the past few days he has had frequent fluttering in his heart, he has prior history of CHF and cardiogenic shock.  Denies chest pain, shortness of breath or swelling.  Called his cardiologist office who recommended he come in for evaluation.  He reports today he did have 1 episode of vomiting but denies abdominal pain, took some nausea medicine, no further emesis.  Review of Systems  Positive: Palpitations, vomiting Negative: Chest pain, shortness of breath, swelling  Physical Exam  BP 93/68 (BP Location: Right Arm)   Pulse 99   Temp 98.2 F (36.8 C) (Oral)   Resp 16   Ht 6\' 1"  (1.854 m)   Wt 119 kg   SpO2 98%   BMI 34.61 kg/m  Gen:   Awake, no distress   HEENT:  Atraumatic  Resp:  Normal effort  Cardiac:  Normal rate  Abd:   Nondistended, nontender  MSK:   Moves extremities without difficulty  Neuro:  Speech clear   Medical Decision Making  Medically screening exam initiated at 7:34 PM.  Appropriate orders placed.  [name] was informed that the remainder of the evaluation will be completed by another provider, this initial triage assessment does not replace that evaluation, and the importance of remaining in the ED until their evaluation is complete.  Clinical Impression  1.  Palpitations   Jacqlyn Larsen, [place] 05/19/20 1942

## 2020-05-19 NOTE — ED Triage Notes (Signed)
Palpitations started last night. Denies any chest pain, abdominal pain, and shortness of breath. Recently started on new med.

## 2020-05-20 ENCOUNTER — Telehealth (HOSPITAL_COMMUNITY): Payer: Self-pay | Admitting: Pharmacy Technician

## 2020-05-20 ENCOUNTER — Other Ambulatory Visit (HOSPITAL_COMMUNITY): Payer: Self-pay | Admitting: *Deleted

## 2020-05-20 ENCOUNTER — Other Ambulatory Visit (HOSPITAL_COMMUNITY): Payer: Self-pay | Admitting: Psychiatry

## 2020-05-20 ENCOUNTER — Other Ambulatory Visit: Payer: Self-pay | Admitting: Internal Medicine

## 2020-05-20 ENCOUNTER — Other Ambulatory Visit (HOSPITAL_COMMUNITY): Payer: Self-pay

## 2020-05-20 ENCOUNTER — Telehealth: Payer: Self-pay | Admitting: Internal Medicine

## 2020-05-20 ENCOUNTER — Encounter: Payer: 59 | Admitting: Physical Therapy

## 2020-05-20 MED ORDER — MAGNESIUM OXIDE 400 (241.3 MG) MG PO TABS
800.0000 mg | ORAL_TABLET | Freq: Once | ORAL | Status: AC
  Administered 2020-05-20: 800 mg via ORAL
  Filled 2020-05-20: qty 2

## 2020-05-20 MED ORDER — ONDANSETRON 4 MG PO TBDP
4.0000 mg | ORAL_TABLET | Freq: Once | ORAL | Status: AC
  Administered 2020-05-20: 4 mg via ORAL
  Filled 2020-05-20: qty 1

## 2020-05-20 MED ORDER — SUCRALFATE 1 G PO TABS: 1.0000 g | ORAL_TABLET | Freq: Three times a day (TID) | ORAL | 0 refills | Status: DC

## 2020-05-20 MED ORDER — IVABRADINE HCL 7.5 MG PO TABS: 7.5000 mg | ORAL_TABLET | Freq: Two times a day (BID) | ORAL | 3 refills | Status: DC

## 2020-05-20 NOTE — Telephone Encounter (Signed)
Patient's wife called back, discussed the last message. Since patient started taking Zoloft he noticed the vomiting and nausea increase. This is something that usually gets better with consistent taking over the next few weeks. Advised his wife that I would send a message to the provider to see what he suggests would help with nausea.  Transferred the patient's wife to [place] (CMA) to go over disability and further help.  Charlann Boxer, CPhT

## 2020-05-20 NOTE — Telephone Encounter (Signed)
I spoke with his wife and she said his hospital discharge papers said to continue the Carafate but it had no refills. I confirmed his pharmacy and sent in a months worth. She has also put a call into his cardiac office to confirm that this will be tolerated with the meds they rx. She will wait to hear from them before picking up the rx.

## 2020-05-20 NOTE — ED Notes (Signed)
Follow up appts reviewed w/ pt. Denies questions or concerns @ this time. Evaluated for palpitations. Workup WNL. Denied palpations throughout stay. Education on s/s of worsening and when to return. T. Left w/ even and steady gait. NAD noted. PIV removed and VSS. Wife accompanying pt.

## 2020-05-20 NOTE — Discharge Instructions (Addendum)
You were evaluated in the Emergency Department and after careful evaluation, we did not find any emergent condition requiring admission or further testing in the hospital.  Your exam/testing today was overall reassuring.  Please return to the Emergency Department if you experience any worsening of your condition.  Thank you for allowing us to be a part of your care.  

## 2020-05-20 NOTE — ED Notes (Signed)
Pt resting in bed @ this time w/ NAD noted. VSS. Cardiac monitoring in place w/ bp cycling and spo2 being monnitored. Updated on plan of care. Deny needs or concerns @ this time. Bed low and locked.

## 2020-05-20 NOTE — Telephone Encounter (Signed)
Received a message that the patient needs a Corlanor PA. Upon further review, the patient's current Corlanor PA is valid through 04/23/21. He is currently prescribed 5mg , take 1.5 tabs (7.5mg ), twice daily. His insurance would prefer that he be switched to 7.5mg  since it is available. A test claim of 7.5mg  bid for 90 days, had a $0 copay. Sent Jasmine, ([place]) a request to send a new RX to the patient's pharmacy.  There was also a request for Sucralfate. This is not on the patient's med list and not managed at our clinic. He would have to request this from his PCP.  Called to relay all information above, the patient's wife stated that she was on a call and would have to call me back. Provided her my phone number, [phone].  Charlann Boxer, CPhT

## 2020-05-20 NOTE — ED Provider Notes (Signed)
[facility] Emergency Department Provider Note MRN:  [id number]  Arrival date & time: 05/20/20     Chief Complaint   Palpitations   History of Present Illness   Kevin Baxter is a 50 y.o. year-old male with a history of heart failure presenting to the ED with chief complaint of palpitations.  Fluttering heart symptoms today, called cardiology office and advised to come to the emergency department.  Single episode of emesis as well today after trying to eat something.  Low appetite today.  Denies any fever, no abdominal pain, no constipation or diarrhea.  Did some increased activities yesterday, some yard work.  Also started a new antianxiety medication recently.  No chest pain or shortness of breath, no increased swelling to the legs.  Review of Systems  A complete 10 system review of systems was obtained and all systems are negative except as noted in the HPI and PMH.   Patient's Health History    Past Medical History:  Diagnosis Date  . Allergy   . Anxiety   . Arthritis   . CHF (congestive heart failure) ([place])   . Fundic gland polyposis of stomach   . GERD with esophagitis   . Gout   . Hx of adenomatous colonic polyps   . Hypertension     Past Surgical History:  Procedure Laterality Date  . COLONOSCOPY  05/2019  . ESOPHAGOGASTRODUODENOSCOPY  05/2019  . HAND SURGERY Right    2006 for a FX  . IR ANGIOGRAM PULMONARY BILATERAL SELECTIVE  04/16/2020  . IR ANGIOGRAM SELECTIVE EACH ADDITIONAL VESSEL  04/16/2020  . IR ANGIOGRAM SELECTIVE EACH ADDITIONAL VESSEL  04/16/2020  . IR THROMBECT PRIM MECH INIT (INCLU) MOD SED  04/16/2020  . IR US GUIDE VASC ACCESS LEFT  04/16/2020  . IR US GUIDE VASC ACCESS LEFT  04/16/2020  . IR US GUIDE VASC ACCESS RIGHT  04/16/2020  . RADIOLOGY WITH ANESTHESIA N/A 04/16/2020   Procedure: IR WITH ANESTHESIA- PULMONARY EMBOLUS INTERVENTION;  Surgeon: Suzette Battiest, MD;  Location: [facility];  Service: Radiology;  Laterality: N/A;   . RIGHT/LEFT HEART CATH AND CORONARY ANGIOGRAPHY N/A 04/15/2020   Procedure: RIGHT/LEFT HEART CATH AND CORONARY ANGIOGRAPHY;  Surgeon: Jolaine Artist, MD;  Location: [facility];  Service: Cardiovascular;  Laterality: N/A;    Family History  Problem Relation Age of Onset  . Cancer Mother        CERVICAL  . Hypertension Father   . Heart failure Father        no MI, d/t etoh  . Diabetes Sister   . Diabetes Sister   . Diabetes Paternal Aunt   . Diabetes Cousin   . Colon cancer Neg Hx   . Prostate cancer Neg Hx   . Stroke Neg Hx   . Esophageal cancer Neg Hx   . Rectal cancer Neg Hx   . Stomach cancer Neg Hx     Social History   Socioeconomic History  . Marital status: Married    Spouse name: Not on file  . Number of children: 2  . Years of education: Not on file  . Highest education level: Not on file  Occupational History  . Occupation: works for [employer]  . Smoking status: Never Smoker  . Smokeless tobacco: Never Used  Vaping Use  . Vaping Use: Never used  Substance and Sexual Activity  . Alcohol use: Yes    Comment: very rare   .  Drug use: No  . Sexual activity: Not on file  Other Topics Concern  . Not on file  Social History Narrative   Household: pt, wife daughter (2013),  boy  (2006),    Social Determinants of Radio broadcast assistant Strain: Not on file  Food Insecurity: Not on file  Transportation Needs: Not on file  Physical Activity: Not on file  Stress: Not on file  Social Connections: Not on file  Intimate Partner Violence: Not on file     Physical Exam   Vitals:   05/20/20 0000 05/20/20 0100  BP: (!) 144/91 (!) 135/93  Pulse: 96 96  Resp: 19 (!) 24  Temp: 98.2 F (36.8 C) 98.5 F (36.9 C)  SpO2: 99% 99%    CONSTITUTIONAL: Well-appearing, NAD NEURO:  Alert and oriented x 3, no focal deficits EYES:  eyes equal and reactive ENT/NECK:  no LAD, no JVD CARDIO: Regular rate, well-perfused, normal S1 and S2 PULM:   CTAB no wheezing or rhonchi GI/GU:  normal bowel sounds, non-distended, non-tender MSK/SPINE:  No gross deformities, no edema SKIN:  no rash, atraumatic PSYCH:  Appropriate speech and behavior  *Additional and/or pertinent findings included in MDM below  Diagnostic and Interventional Summary    EKG Interpretation  Date/Time:  Wednesday May 19 2020 19:42:21 EDT Ventricular Rate:  100 PR Interval:  180 QRS Duration: 94 QT Interval:  322 QTC Calculation: 415 R Axis:   -9 Text Interpretation: Normal sinus rhythm Nonspecific T wave abnormality Abnormal ECG No significant change was found Confirmed by Gerlene Fee [phone]) on 05/20/2020 12:18:54 AM      Labs Reviewed  BASIC METABOLIC PANEL - Abnormal; Notable for the following components:      Result Value   Glucose, Bld 111 (*)    Creatinine, Ser 1.26 (*)    All other components within normal limits  CBC WITH DIFFERENTIAL/PLATELET - Abnormal; Notable for the following components:   MCH 25.1 (*)    RDW 19.2 (*)    All other components within normal limits  MAGNESIUM - Abnormal; Notable for the following components:   Magnesium 1.6 (*)    All other components within normal limits  BRAIN NATRIURETIC PEPTIDE - Abnormal; Notable for the following components:   B Natriuretic Peptide 201.4 (*)    All other components within normal limits  TROPONIN I (HIGH SENSITIVITY)  TROPONIN I (HIGH SENSITIVITY)    DG Chest 2 View  Final Result      Medications  magnesium oxide (MAG-OX) tablet 800 mg (800 mg Oral Given 05/20/20 0042)  ondansetron (ZOFRAN-ODT) disintegrating tablet 4 mg (4 mg Oral Given 05/20/20 0044)     Procedures  /  Critical Care Procedures  ED Course and Medical Decision Making  I have reviewed the triage vital signs, the nursing notes, and pertinent available records from the EMR.  Listed above are laboratory and imaging tests that I personally ordered, reviewed, and interpreted and then considered in my medical  decision making (see below for details).  Patient is well-appearing with normal vital signs, is not fluid overloaded.  No chest pain, EKG is in sinus rhythm with no significant changes, troponin is negative x2.  BNP minimally elevated.  Magnesium is low which could explain palpitations.  We will continue cardiac monitoring here in the emergency department to ensure no obvious intermittent arrhythmia.  Suspect he simply overdid it in the yard yesterday.  Or could be having a mild viral GI illness causing the nausea.  On reassessment patient continues to feel well, no arrhythmia during this session of cardiac monitoring, appropriate for discharge with return precautions.  Barth Kirks. [place] Small, [place] [email or web address]  Final Clinical Impressions(s) / ED Diagnoses     ICD-10-CM   1. Palpitations  R00.2   2. Nausea  R11.0     ED Discharge Orders    None       Discharge Instructions Discussed with and Provided to Patient:     Discharge Instructions     You were evaluated in the Emergency Department and after careful evaluation, we did not find any emergent condition requiring admission or further testing in the hospital.  Your exam/testing today was overall reassuring.  Please return to the Emergency Department if you experience any worsening of your condition.  Thank you for allowing [place] to be a part of your care.        Maudie Flakes, MD 05/20/20 0201

## 2020-05-24 ENCOUNTER — Ambulatory Visit (INDEPENDENT_AMBULATORY_CARE_PROVIDER_SITE_OTHER): Payer: 59 | Admitting: Physical Therapy

## 2020-05-24 ENCOUNTER — Encounter: Payer: Self-pay | Admitting: Physical Therapy

## 2020-05-24 ENCOUNTER — Other Ambulatory Visit: Payer: Self-pay

## 2020-05-24 DIAGNOSIS — M6281 Muscle weakness (generalized): Secondary | ICD-10-CM

## 2020-05-24 DIAGNOSIS — R6889 Other general symptoms and signs: Secondary | ICD-10-CM

## 2020-05-24 DIAGNOSIS — R2689 Other abnormalities of gait and mobility: Secondary | ICD-10-CM | POA: Diagnosis not present

## 2020-05-24 NOTE — Patient Instructions (Signed)
Access Code: [email or web address] URL: [email or web address] Date: 05/24/2020 Prepared by: Shannon  Exercises Sit to Stand - 1 x daily - 7 x weekly - 3 sets - 5 reps Standing Heel Raise with Support - 1 x daily - 7 x weekly - 3 sets - 5 reps Standing Hip Abduction - 1 x daily - 7 x weekly - 2 sets - 10 reps Standing Hip Extension with Counter Support - 1 x daily - 7 x weekly - 2 sets - 10 reps Seated Bilateral Shoulder External Rotation with Resistance - 1 x daily - 7 x weekly - 2 sets - 10 reps Seated Shoulder Horizontal Abduction with Resistance - 1 x daily - 7 x weekly - 2 sets - 10 reps Seated Shoulder Row with Resistance Anchored at Feet - 1 x daily - 7 x weekly - 2 sets - 10 reps

## 2020-05-24 NOTE — Therapy (Signed)
[place], [place], [address] Phone: [phone]   Fax:  [phone]  Physical Therapy Treatment  Patient Details  Name: SERGIO ZAWISLAK MRN: [id number] Date of Birth: 07/17/1970 Referring Provider (PT): Glori Bickers   Encounter Date: 05/24/2020   PT End of Session - 05/24/20 1013    Visit Number 7    Number of Visits 12    Date for PT Re-Evaluation 06/09/20    PT Start Time 0932    PT Stop Time 1013    PT Time Calculation (min) 41 min    Activity Tolerance Patient tolerated treatment well    Behavior During Therapy [facility] for tasks assessed/performed           Past Medical History:  Diagnosis Date  . Allergy   . Anxiety   . Arthritis   . CHF (congestive heart failure) ([place])   . Fundic gland polyposis of stomach   . GERD with esophagitis   . Gout   . Hx of adenomatous colonic polyps   . Hypertension     Past Surgical History:  Procedure Laterality Date  . COLONOSCOPY  05/2019  . ESOPHAGOGASTRODUODENOSCOPY  05/2019  . HAND SURGERY Right    2006 for a FX  . IR ANGIOGRAM PULMONARY BILATERAL SELECTIVE  04/16/2020  . IR ANGIOGRAM SELECTIVE EACH ADDITIONAL VESSEL  04/16/2020  . IR ANGIOGRAM SELECTIVE EACH ADDITIONAL VESSEL  04/16/2020  . IR THROMBECT PRIM MECH INIT (INCLU) MOD SED  04/16/2020  . IR US GUIDE VASC ACCESS LEFT  04/16/2020  . IR US GUIDE VASC ACCESS LEFT  04/16/2020  . IR US GUIDE VASC ACCESS RIGHT  04/16/2020  . RADIOLOGY WITH ANESTHESIA N/A 04/16/2020   Procedure: IR WITH ANESTHESIA- PULMONARY EMBOLUS INTERVENTION;  Surgeon: Suzette Battiest, MD;  Location: [place];  Service: Radiology;  Laterality: N/A;  . RIGHT/LEFT HEART CATH AND CORONARY ANGIOGRAPHY N/A 04/15/2020   Procedure: RIGHT/LEFT HEART CATH AND CORONARY ANGIOGRAPHY;  Surgeon: Jolaine Artist, MD;  Location: [place] [facility];  Service: Cardiovascular;  Laterality: N/A;    There were no vitals filed for this  visit.   Subjective Assessment - 05/24/20 0940    Subjective "I might have pressed my luck Saturday". Pt reports he went to track meet on Saturday; spent time on cot under tent and also walked 5000 steps to view events.  He reports he was prescribed meds for nausea caused by his anxiety meds.  He has been walking daily, on average 1000 steps - but held off on HEP.  He has not checked into Cardiac rehab program yet.    Currently in Pain? No/denies    Pain Score 0-No pain              [facility] PT Assessment - 05/24/20 0001      Assessment   Medical Diagnosis muscle weakness    Referring Provider (PT) Glori Bickers    Onset Date/Surgical Date 04/15/20            [facility] Adult PT Treatment/Exercise - 05/24/20 0001      Knee/Hip Exercises: Aerobic   Tread Mill 2.2 x 3 min, then 3% grade, 1.7 mph x 1 min, then 0% at 2.1 x 1 min, 4% grade and 1.8 mph x 1 min, then 0% @ 1.8 mph x 1 min - 7 min total.  HR up to 113 bpm    Nustep L5-6: legs and arms x 7 min total.  Knee/Hip Exercises: Standing   Heel Raises Both;2 sets;10 reps    Hip Abduction Right;Left;2 sets;10 reps;Knee straight    Functional Squat 2 sets;10 reps      Shoulder Exercises: Seated   Row Strengthening;Both;10 reps   2 sets   Theraband Level (Shoulder Row) Level 2 (Red)    Horizontal ABduction Both;10 reps   2 sets   Theraband Level (Shoulder Horizontal ABduction) Level 2 (Red)    External Rotation Both;10 reps   2 sets   Theraband Level (Shoulder External Rotation) Level 2 (Red)                  PT Education - 05/24/20 0958    Education Details added horiz abdct with shoulders;  issued red band.    Person(s) Educated Patient    Methods Explanation    Comprehension Verbalized understanding;Returned demonstration               PT Long Term Goals - 05/11/20 1139      PT LONG TERM GOAL #1   Title Pt will be independent with HEP    Time 6    Period Weeks    Status On-going      PT LONG TERM  GOAL #2   Title Pt will improve 5x STS by 5 seconds to demo improved functional strength    Time 6    Period Weeks    Status Achieved      PT LONG TERM GOAL #3   Title Pt will improve TUG to 15 seconds to demo improved functional mobility    Time 6    Period Weeks    Status Achieved                 Plan - 05/24/20 7209    Clinical Impression Statement Pt's HR up to 120bpm after completion of 10 squats.  Created circuit with leg and arm exercises; encouraged pt to trial this at home in addition to his walking program.  He had a couple of short seated and standing rest breaks (~2-3 min) to recover.  Making good progress towards remaining goals. Encouraged pt to reach out to MD for medication management (re: anxiety meds).  Pt returns to cardiologist 5/11.    Rehab Potential Good    PT Frequency 2x / week    PT Duration 6 weeks    PT Treatment/Interventions ADLs/Self Care Home Management;Therapeutic activities;Therapeutic exercise;Balance training;Functional mobility training;Patient/family education;Gait training    PT Next Visit Plan progress cardiovascular conditioning and postural strength    PT Home Exercise Plan [id number]    Consulted and Agree with Plan of Care Patient           Patient will benefit from skilled therapeutic intervention in order to improve the following deficits and impairments:  Decreased balance,Decreased endurance,Decreased mobility,Cardiopulmonary status limiting activity,Decreased activity tolerance,Decreased strength  Visit Diagnosis: Decreased functional activity tolerance  Muscle weakness (generalized)  Other abnormalities of gait and mobility     Problem List Patient Active Problem List   Diagnosis Date Noted  . Protein-calorie malnutrition, severe 04/20/2020  . Congestive heart failure (CHF) ([place])   . Acute saddle pulmonary embolism with acute cor pulmonale (HCC)   . Localized swelling of right lower extremity 04/12/2020  . Multiple  gastric polyps 04/12/2020  . Poor responsiveness after MAC anesthesia 04/09/2020 04/12/2020  . Hematemesis with nausea 04/12/2020  . 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  . Left ventricular ejection fraction less than 20% 04/12/2020  .  Acute acalculous cholecystitis 04/12/2020  . Mural thrombus of cardiac apex 04/12/2020  . Acute deep vein thrombosis (DVT) of right femoral vein ([place]) 04/12/2020  . Intractable nausea and vomiting 04/11/2020  . Weight loss, unintentional 04/06/2020  . Esophageal dysphagia 04/06/2020  . Abnormal LFTs 04/06/2020  . Depression, major, single episode, moderate ([place]) 03/02/2020  . Pancreatitis, acute 02/26/2020  . Hx of adenomatous colonic polyps   . Morbid obesity ([place]) 04/22/2015  . PCP NOTES >>>>>>>>>>>>>>>>>>>>>>>>> 04/22/2015  . Hyperglycemia 03/11/2014  . Erectile dysfunction 07/21/2011  . Annual physical exam 01/05/2011  . Gout 03/14/2006  . Essential hypertension 03/14/2006  . ALLERGIC RHINITIS 03/14/2006   Kerin Perna, PTA 05/24/20 10:16 AM  [place], [place], [address] Phone: [phone]   Fax:  [phone]  Name: RAYLIN WINER MRN: [id number] Date of Birth: [birth year]

## 2020-05-26 ENCOUNTER — Ambulatory Visit (INDEPENDENT_AMBULATORY_CARE_PROVIDER_SITE_OTHER): Payer: 59 | Admitting: Physical Therapy

## 2020-05-26 ENCOUNTER — Other Ambulatory Visit: Payer: Self-pay

## 2020-05-26 DIAGNOSIS — R2689 Other abnormalities of gait and mobility: Secondary | ICD-10-CM

## 2020-05-26 DIAGNOSIS — R6889 Other general symptoms and signs: Secondary | ICD-10-CM

## 2020-05-26 DIAGNOSIS — M6281 Muscle weakness (generalized): Secondary | ICD-10-CM

## 2020-05-26 NOTE — Therapy (Signed)
[place], [place], [address] Phone: [phone]   Fax:  [phone]  Physical Therapy Treatment  Patient Details  Name: Kevin Baxter MRN: [id number] Date of Birth: 1970/10/30 Referring Provider (PT): Glori Bickers   Encounter Date: 05/26/2020   PT End of Session - 05/26/20 1055    Visit Number 8    Number of Visits 12    Date for PT Re-Evaluation 06/09/20    PT Start Time 5631    PT Stop Time 1058    PT Time Calculation (min) 43 min    Activity Tolerance Patient tolerated treatment well    Behavior During Therapy [facility] for tasks assessed/performed           Past Medical History:  Diagnosis Date  . Allergy   . Anxiety   . Arthritis   . CHF (congestive heart failure) ([place])   . Fundic gland polyposis of stomach   . GERD with esophagitis   . Gout   . Hx of adenomatous colonic polyps   . Hypertension     Past Surgical History:  Procedure Laterality Date  . COLONOSCOPY  05/2019  . ESOPHAGOGASTRODUODENOSCOPY  05/2019  . HAND SURGERY Right    2006 for a FX  . IR ANGIOGRAM PULMONARY BILATERAL SELECTIVE  04/16/2020  . IR ANGIOGRAM SELECTIVE EACH ADDITIONAL VESSEL  04/16/2020  . IR ANGIOGRAM SELECTIVE EACH ADDITIONAL VESSEL  04/16/2020  . IR THROMBECT PRIM MECH INIT (INCLU) MOD SED  04/16/2020  . IR US GUIDE VASC ACCESS LEFT  04/16/2020  . IR US GUIDE VASC ACCESS LEFT  04/16/2020  . IR US GUIDE VASC ACCESS RIGHT  04/16/2020  . RADIOLOGY WITH ANESTHESIA N/A 04/16/2020   Procedure: IR WITH ANESTHESIA- PULMONARY EMBOLUS INTERVENTION;  Surgeon: Suzette Battiest, MD;  Location: [place];  Service: Radiology;  Laterality: N/A;  . RIGHT/LEFT HEART CATH AND CORONARY ANGIOGRAPHY N/A 04/15/2020   Procedure: RIGHT/LEFT HEART CATH AND CORONARY ANGIOGRAPHY;  Surgeon: Jolaine Artist, MD;  Location: [place] [facility];  Service: Cardiovascular;  Laterality: N/A;    There were no vitals filed for this  visit.   Subjective Assessment - 05/26/20 1012    Subjective Pt states the track meet was "a bit much" but states he feels much better now.    Currently in Pain? No/denies                             [facility] Adult PT Treatment/Exercise - 05/26/20 0001      Knee/Hip Exercises: Aerobic   Tread Mill 2.2 x 3 min 0%, 1.7 mph 2%, 2.26mh 0%, 3% 1.7 mph x 2 min   HR 118bpm after incline walking   Nustep L5 UEs and LEs x 6 min      Knee/Hip Exercises: Standing   Hip Abduction Both;2 sets;10 reps    Hip Extension Both;2 sets;10 reps    Lateral Step Up Both;Hand Hold: 0;10 reps;Step Height: 6"    Forward Step Up Both;10 reps;Hand Hold: 0;Step Height: 6"    Functional Squat 2 sets;10 reps      Shoulder Exercises: Seated   Row Both;20 reps    Theraband Level (Shoulder Row) Level 2 (Red)    Horizontal ABduction Both;20 reps    Theraband Level (Shoulder Horizontal ABduction) Level 2 (Red)    External Rotation Both;20 reps    Theraband Level (Shoulder External Rotation) Level 2 (Red)  PT Education - 05/26/20 1102    Education Details spoke about goal of 3000 steps a day to demo increased endurance    Person(s) Educated Patient    Methods Explanation    Comprehension Verbalized understanding               PT Long Term Goals - 05/26/20 1101      PT LONG TERM GOAL #1   Title Pt will be independent with HEP    Status On-going      PT LONG TERM GOAL #4   Title Pt will tolerate walking 3000 steps a day most days of the weeks to progress towards more community mobility    Time 2    Period Weeks    Status New    Target Date 06/09/20                 Plan - 05/26/20 1100    Clinical Impression Statement Pt continue to progress well with progression of strength and cardiovascular exercises. Pt able to take less frequent rest breaks today    PT Next Visit Plan progress cardiovascular conditioning and postural strength    PT Home  Exercise Plan [id number]           Patient will benefit from skilled therapeutic intervention in order to improve the following deficits and impairments:     Visit Diagnosis: Muscle weakness (generalized)  Decreased functional activity tolerance  Other abnormalities of gait and mobility     Problem List Patient Active Problem List   Diagnosis Date Noted  . Protein-calorie malnutrition, severe 04/20/2020  . Congestive heart failure (CHF) ([place])   . Acute saddle pulmonary embolism with acute cor pulmonale (HCC)   . Localized swelling of right lower extremity 04/12/2020  . Multiple gastric polyps 04/12/2020  . Poor responsiveness after MAC anesthesia 04/09/2020 04/12/2020  . Hematemesis with nausea 04/12/2020  . 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  . Left ventricular ejection fraction less than 20% 04/12/2020  . Acute acalculous cholecystitis 04/12/2020  . Mural thrombus of cardiac apex 04/12/2020  . Acute deep vein thrombosis (DVT) of right femoral vein ([place]) 04/12/2020  . Intractable nausea and vomiting 04/11/2020  . Weight loss, unintentional 04/06/2020  . Esophageal dysphagia 04/06/2020  . Abnormal LFTs 04/06/2020  . Depression, major, single episode, moderate ([place]) 03/02/2020  . Pancreatitis, acute 02/26/2020  . Hx of adenomatous colonic polyps   . Morbid obesity ([place]) 04/22/2015  . PCP NOTES >>>>>>>>>>>>>>>>>>>>>>>>> 04/22/2015  . Hyperglycemia 03/11/2014  . Erectile dysfunction 07/21/2011  . Annual physical exam 01/05/2011  . Gout 03/14/2006  . Essential hypertension 03/14/2006  . ALLERGIC RHINITIS 03/14/2006   Omeed Osuna, PT  Tyreshia Ingman 05/26/2020, 11:03 AM  [facility] [place], [place], [address] Phone: [phone]   Fax:  [phone]  Name: FRANCISO DIERKS MRN: [id number] Date of Birth: May 31, 1970

## 2020-06-01 ENCOUNTER — Encounter: Payer: 59 | Admitting: Physical Therapy

## 2020-06-03 ENCOUNTER — Ambulatory Visit (INDEPENDENT_AMBULATORY_CARE_PROVIDER_SITE_OTHER): Payer: 59 | Admitting: Physical Therapy

## 2020-06-03 ENCOUNTER — Other Ambulatory Visit: Payer: Self-pay

## 2020-06-03 ENCOUNTER — Ambulatory Visit (INDEPENDENT_AMBULATORY_CARE_PROVIDER_SITE_OTHER): Payer: 59 | Admitting: Licensed Clinical Social Worker

## 2020-06-03 DIAGNOSIS — M6281 Muscle weakness (generalized): Secondary | ICD-10-CM

## 2020-06-03 DIAGNOSIS — R6889 Other general symptoms and signs: Secondary | ICD-10-CM

## 2020-06-03 DIAGNOSIS — R2689 Other abnormalities of gait and mobility: Secondary | ICD-10-CM | POA: Diagnosis not present

## 2020-06-03 DIAGNOSIS — F4323 Adjustment disorder with mixed anxiety and depressed mood: Secondary | ICD-10-CM

## 2020-06-03 DIAGNOSIS — R3129 Other microscopic hematuria: Secondary | ICD-10-CM | POA: Insufficient documentation

## 2020-06-03 NOTE — Therapy (Signed)
[place], [place], [address] Phone: [phone]   Fax:  [phone]  Physical Therapy Treatment  Patient Details  Name: Kevin Baxter MRN: [id number] Date of Birth: 04-27-1970 Referring Provider (PT): Glori Bickers   Encounter Date: 06/03/2020   PT End of Session - 06/03/20 1407    Visit Number 9    Number of Visits 12    Date for PT Re-Evaluation 06/09/20    PT Start Time 4270    PT Stop Time 1445    PT Time Calculation (min) 42 min    Activity Tolerance Patient tolerated treatment well    Behavior During Therapy [facility] for tasks assessed/performed           Past Medical History:  Diagnosis Date  . Allergy   . Anxiety   . Arthritis   . CHF (congestive heart failure) ([place])   . Fundic gland polyposis of stomach   . GERD with esophagitis   . Gout   . Hx of adenomatous colonic polyps   . Hypertension     Past Surgical History:  Procedure Laterality Date  . COLONOSCOPY  05/2019  . ESOPHAGOGASTRODUODENOSCOPY  05/2019  . HAND SURGERY Right    2006 for a FX  . IR ANGIOGRAM PULMONARY BILATERAL SELECTIVE  04/16/2020  . IR ANGIOGRAM SELECTIVE EACH ADDITIONAL VESSEL  04/16/2020  . IR ANGIOGRAM SELECTIVE EACH ADDITIONAL VESSEL  04/16/2020  . IR THROMBECT PRIM MECH INIT (INCLU) MOD SED  04/16/2020  . IR US GUIDE VASC ACCESS LEFT  04/16/2020  . IR US GUIDE VASC ACCESS LEFT  04/16/2020  . IR US GUIDE VASC ACCESS RIGHT  04/16/2020  . RADIOLOGY WITH ANESTHESIA N/A 04/16/2020   Procedure: IR WITH ANESTHESIA- PULMONARY EMBOLUS INTERVENTION;  Surgeon: Suzette Battiest, MD;  Location: [place];  Service: Radiology;  Laterality: N/A;  . RIGHT/LEFT HEART CATH AND CORONARY ANGIOGRAPHY N/A 04/15/2020   Procedure: RIGHT/LEFT HEART CATH AND CORONARY ANGIOGRAPHY;  Surgeon: Jolaine Artist, MD;  Location: [place] [facility];  Service: Cardiovascular;  Laterality: N/A;    There were no vitals filed for this  visit.   Subjective Assessment - 06/03/20 1506    Subjective Pt reports he has been getting about 2000-3000 steps per day.  He has not tried the hill to pick up daughter from bus stop yet.    Patient Stated Goals improve strength and endurance    Currently in Pain? No/denies    Pain Score 0-No pain              [facility] PT Assessment - 06/03/20 0001      Assessment   Medical Diagnosis muscle weakness    Referring Provider (PT) Glori Bickers    Onset Date/Surgical Date 04/15/20            [facility] Adult PT Treatment/Exercise - 06/03/20 0001      Self-Care   Self-Care Other Self-Care Comments    Other Self-Care Comments  instructed pt on how to take pulse (radial/carotid); pt had some difficulty locating pulse and counting. May benefit from revisiting this in future.      Knee/Hip Exercises: Aerobic   Tread Mill 2.0 mph x 2.5 min, then 4% incline with 1.7 mph x 1.5 min; then 1.7-2.2 mph at 0% x 1.25 min; 4% , 4% _0 .53mh x 1 min, 2.256m x 1 min - HR up to 130 bpm = 8 min total.    Recumbent Bike L1-4:  8 min.  HR 109 at highest.      Knee/Hip Exercises: Standing   Lateral Step Up 2 sets;10 reps;Step Height: 6";Hand Hold: 0   with increased step speed   Lateral Step Up Limitations seated rest monitoring HR after each set (HR up to 142 bpm, down to 105-110 bpm after ~2 min)    Forward Step Up Right;Left;5 reps   12"  X 2 sets    Forward Step Up Limitations HR up to 142 bpm                       PT Long Term Goals - 06/03/20 1505      PT LONG TERM GOAL #1   Title Pt will be independent with HEP    Time 6    Period Weeks    Status On-going      PT LONG TERM GOAL #2   Title Pt will improve 5x STS by 5 seconds to demo improved functional strength    Time 6    Period Weeks    Status Achieved      PT LONG TERM GOAL #3   Title Pt will improve TUG to 15 seconds to demo improved functional mobility    Time 6    Period Weeks    Status Achieved      PT LONG TERM  GOAL #4   Title Pt will tolerate walking 3000 steps a day most days of the weeks to progress towards more community mobility    Time 2    Period Weeks    Status On-going                 Plan - 06/03/20 1427    Clinical Impression Statement Pt tolerated increased incline on treadmill for up to 2 min (@ 1.7 mph) with HR up to 130 bpm.  Added bicycle up to L4, with HR up to 110 bpm. Pt performed exercises with HR range of 100-142bpm; included short seated rest breaks with monitoring of HR.  Pt tolerated all exercises without feeling fatigued or winded.  Pt informed therapist that he has recumbent bike at home; encouraged pt to incorporate bike into HEP - starting at 10 min with low resistance.  Progressing well towards goals.    Rehab Potential Good    PT Frequency 2x / week    PT Duration 6 weeks    PT Treatment/Interventions ADLs/Self Care Home Management;Therapeutic activities;Therapeutic exercise;Balance training;Functional mobility training;Patient/family education;Gait training    PT Next Visit Plan progress cardiovascular conditioning and postural strength    PT Home Exercise Plan [id number]    Consulted and Agree with Plan of Care Patient           Patient will benefit from skilled therapeutic intervention in order to improve the following deficits and impairments:  Decreased balance,Decreased endurance,Decreased mobility,Cardiopulmonary status limiting activity,Decreased activity tolerance,Decreased strength  Visit Diagnosis: Muscle weakness (generalized)  Other abnormalities of gait and mobility  Decreased functional activity tolerance     Problem List Patient Active Problem List   Diagnosis Date Noted  . Protein-calorie malnutrition, severe 04/20/2020  . Congestive heart failure (CHF) ([place])   . Acute saddle pulmonary embolism with acute cor pulmonale (HCC)   . Localized swelling of right lower extremity 04/12/2020  . Multiple gastric polyps 04/12/2020  . Poor  responsiveness after MAC anesthesia 04/09/2020 04/12/2020  . Hematemesis with nausea 04/12/2020  . 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  . Left  ventricular ejection fraction less than 20% 04/12/2020  . Acute acalculous cholecystitis 04/12/2020  . Mural thrombus of cardiac apex 04/12/2020  . Acute deep vein thrombosis (DVT) of right femoral vein ([place]) 04/12/2020  . Intractable nausea and vomiting 04/11/2020  . Weight loss, unintentional 04/06/2020  . Esophageal dysphagia 04/06/2020  . Abnormal LFTs 04/06/2020  . Depression, major, single episode, moderate ([place]) 03/02/2020  . Pancreatitis, acute 02/26/2020  . Hx of adenomatous colonic polyps   . Morbid obesity ([place]) 04/22/2015  . PCP NOTES >>>>>>>>>>>>>>>>>>>>>>>>> 04/22/2015  . Hyperglycemia 03/11/2014  . Erectile dysfunction 07/21/2011  . Annual physical exam 01/05/2011  . Gout 03/14/2006  . Essential hypertension 03/14/2006  . ALLERGIC RHINITIS 03/14/2006   Kerin Perna, PTA 06/03/20 3:08 PM  [place], [place], [address] Phone: [phone]   Fax:  [phone]  Name: ZACKERY BRINE MRN: [id number] Date of Birth: 1970/12/19

## 2020-06-05 NOTE — Progress Notes (Signed)
   THERAPIST PROGRESS NOTE  Session Time: 10:00 am-10:45 an  Type of Therapy: Individual Therapy   Purpose of session: Jamelle "Kevin Baxter" will manage mood and anxiety as evidenced by challenging anxious thoughts, cope with daily stressors, not worry so much, and return to previous level of functioning/realistic thinking for 5 out of 7 days for 60 days.  Interventions: Therapist utilized CBT and Solution focused brief therapy to address mood and anxiety. Therapist provided support and empathy to patient as he shared his thoughts and feelings in session. Therapist had patient identify pre-session change. Therapist worked with patient to identify ways to reduce anxiety including: challenging anxious thoughts, using positive/realistic self talk, use deep breathing, and moving more.  Effectiveness: Patient was oriented x5 (person, place, situation, time, and object). Patient was casually dressed, and appropriately groomed. Patient was alert, engaged, and pleasant during session. Patient noted that he has been feeling better emotionally and feels like he is getting stronger physically. Patient has been a little nervous about an upcoming echocardiogram. He is worried they will say he is not getting better. Patient understood that he has some "evidence" that he is improving with his improved strength. He also understood that he has to not only consider the worse cast scenario, but also the best case scenario, and the most likely outcome. Patient has spoke to a friend with heart failure and he has improved. He has read things online that indicate he won't get better. Patient is focusing on what he can see and recognize which is he is getting better. He is going to use self talk to remind himself that he will be ok. Patient also identified he needs to move more. He doesn't get winded anymore so he needs to move around more. Patient is also going to work on intentional breathing to reduce the physical impact of  anxiety.   Patient engaged in session. Patient responded well to interventions. Patient continues to meet criteria for Adjustment disorder with anxiety and depression. Patient will continue in outpatient therapy due to being the least restrictive service to meet his needs. Patient made minimal progress on his goals.   Suicidal/Homicidal: Negativewithout intent/plan  Plan: Return again in 1-2 weeks.  Diagnosis: Axis I: Adjustment Disorder with Mixed Emotional Features    Axis II: No diagnosis    Glori Bickers, LCSW 06/05/2020

## 2020-06-08 ENCOUNTER — Other Ambulatory Visit: Payer: Self-pay

## 2020-06-08 ENCOUNTER — Ambulatory Visit (INDEPENDENT_AMBULATORY_CARE_PROVIDER_SITE_OTHER): Payer: 59 | Admitting: Physical Therapy

## 2020-06-08 DIAGNOSIS — R2689 Other abnormalities of gait and mobility: Secondary | ICD-10-CM

## 2020-06-08 DIAGNOSIS — M6281 Muscle weakness (generalized): Secondary | ICD-10-CM | POA: Diagnosis not present

## 2020-06-08 DIAGNOSIS — R6889 Other general symptoms and signs: Secondary | ICD-10-CM

## 2020-06-08 NOTE — Therapy (Signed)
[facility]  [address] [place], [place], [address] Phone: [phone]   Fax:  [phone]  Physical Therapy Treatment  Patient Details  Name: Kevin Baxter MRN: [id number] Date of Birth: [birth year] Referring Provider (PT): Glori Bickers   Encounter Date: 06/08/2020   PT End of Session - 06/08/20 1052    Visit Number 10    Number of Visits 12    Date for PT Re-Evaluation 06/09/20    PT Start Time 1829    PT Stop Time 1055    PT Time Calculation (min) 40 min    Activity Tolerance Patient tolerated treatment well    Behavior During Therapy [facility] for tasks assessed/performed           Past Medical History:  Diagnosis Date  . Allergy   . Anxiety   . Arthritis   . CHF (congestive heart failure) ([place])   . Fundic gland polyposis of stomach   . GERD with esophagitis   . Gout   . Hx of adenomatous colonic polyps   . Hypertension     Past Surgical History:  Procedure Laterality Date  . COLONOSCOPY  05/2019  . ESOPHAGOGASTRODUODENOSCOPY  05/2019  . HAND SURGERY Right    2006 for a FX  . IR ANGIOGRAM PULMONARY BILATERAL SELECTIVE  04/16/2020  . IR ANGIOGRAM SELECTIVE EACH ADDITIONAL VESSEL  04/16/2020  . IR ANGIOGRAM SELECTIVE EACH ADDITIONAL VESSEL  04/16/2020  . IR THROMBECT PRIM MECH INIT (INCLU) MOD SED  04/16/2020  . IR US GUIDE VASC ACCESS LEFT  04/16/2020  . IR US GUIDE VASC ACCESS LEFT  04/16/2020  . IR US GUIDE VASC ACCESS RIGHT  04/16/2020  . RADIOLOGY WITH ANESTHESIA N/A 04/16/2020   Procedure: IR WITH ANESTHESIA- PULMONARY EMBOLUS INTERVENTION;  Surgeon: Suzette Battiest, MD;  Location: [place];  Service: Radiology;  Laterality: N/A;  . RIGHT/LEFT HEART CATH AND CORONARY ANGIOGRAPHY N/A 04/15/2020   Procedure: RIGHT/LEFT HEART CATH AND CORONARY ANGIOGRAPHY;  Surgeon: Jolaine Artist, MD;  Location: [place] CV LAB;  Service: Cardiovascular;  Laterality: N/A;    There were no vitals filed for this  visit.   Subjective Assessment - 06/08/20 1018    Subjective Pt states he can now shower withotu getting worn out and can go up the stairs withotu being out of breath. States he is averaging 5000 steps a day    Patient Stated Goals improve strength and endurance    Currently in Pain? No/denies              [facility] PT Assessment - 06/08/20 0001      Assessment   Medical Diagnosis muscle weakness    Referring Provider (PT) Glori Bickers    Onset Date/Surgical Date 04/15/20                         [facility] Adult PT Treatment/Exercise - 06/08/20 0001      Knee/Hip Exercises: Aerobic   Tread Mill 2 min 0% 2.38mh, 2 min 3% 2.070m, 2 min 2.77m88m0%, 2 min 3% 2.0mp90m  Recumbent Bike L3-4 8 min HR 108-109      Knee/Hip Exercises: Machines for Strengthening   Cybex Leg Press x10 7 plates, x 10 8 plates      Knee/Hip Exercises: Standing   Lateral Step Up 2 sets;10 reps;Hand Hold: 0;Step Height: 6"    Lateral Step Up Limitations HR 135-136 BPM    Forward  Step Up 2 sets;Both;10 reps;Hand Hold: 0;Step Height: 6"    Forward Step Up Limitations HR 139-140 bpm    Functional Squat 2 sets;10 reps   10# KB   Functional Squat Limitations HR144bpm                  PT Education - 06/08/20 1052    Education Details plan for d/c and transition to cardiac rehab    Person(s) Educated Patient    Methods Explanation;Handout    Comprehension Verbalized understanding               PT Long Term Goals - 06/08/20 1019      PT LONG TERM GOAL #1   Title Pt will be independent with HEP    Status Achieved      PT LONG TERM GOAL #4   Title Pt will tolerate walking 3000 steps a day most days of the weeks to progress towards more community mobility    Baseline 5000 steps a day on 06/08/20    Status Achieved                 Plan - 06/08/20 1055    Clinical Impression Statement Pt has improved strength and cardiovascular endurance. He is independent with HEP and has  met all goals. Pt is ready to progress to cardiac rehab.    PT Home Exercise Plan [id number]    Consulted and Agree with Plan of Care Patient           Patient will benefit from skilled therapeutic intervention in order to improve the following deficits and impairments:     Visit Diagnosis: Other abnormalities of gait and mobility  Muscle weakness (generalized)  Decreased functional activity tolerance     Problem List Patient Active Problem List   Diagnosis Date Noted  . Microscopic hematuria 06/03/2020  . Protein-calorie malnutrition, severe 04/20/2020  . Congestive heart failure (CHF) ([place])   . Acute saddle pulmonary embolism with acute cor pulmonale (HCC)   . Localized swelling of right lower extremity 04/12/2020  . Multiple gastric polyps 04/12/2020  . Poor responsiveness after MAC anesthesia 04/09/2020 04/12/2020  . Hematemesis with nausea 04/12/2020  . 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  . Left ventricular ejection fraction less than 20% 04/12/2020  . Acute acalculous cholecystitis 04/12/2020  . Mural thrombus of cardiac apex 04/12/2020  . Acute deep vein thrombosis (DVT) of right femoral vein ([place]) 04/12/2020  . Weight loss, unintentional 04/06/2020  . Esophageal dysphagia 04/06/2020  . Abnormal LFTs 04/06/2020  . Depression, major, single episode, moderate ([place]) 03/02/2020  . Pancreatitis, acute 02/26/2020  . Hx of adenomatous colonic polyps   . Morbid obesity ([place]) 04/22/2015  . PCP NOTES >>>>>>>>>>>>>>>>>>>>>>>>> 04/22/2015  . Hyperglycemia 03/11/2014  . Erectile dysfunction 07/21/2011  . Annual physical exam 01/05/2011  . Gout 03/14/2006  . Essential hypertension 03/14/2006  . ALLERGIC RHINITIS 03/14/2006   PHYSICAL THERAPY DISCHARGE SUMMARY  Visits from Start of Care: 10  Current functional level related to goals / functional outcomes: Improved strength and endurance   Remaining deficits: See above   Education /  Equipment: HEP Plan: Patient agrees to discharge.  Patient goals were met. Patient is being discharged due to meeting the stated rehab goals.  ?????    Isabelle Course, PT   [facility] 06/08/2020, 10:57 AM  [facility] [address] [place], [place], [address] Phone: [phone]   Fax:  [phone]  Name: Kevin ARZUAGA  Baxter MRN: [id number] Date of Birth: 1970/02/08

## 2020-06-08 NOTE — Patient Instructions (Signed)
Access Code: [email or web address] URL: [email or web address] Date: 06/08/2020 Prepared by: Isabelle Course  Program Notes Walk 5000 steps a day   Exercises Seated Bilateral Shoulder External Rotation with Resistance - 1 x daily - 7 x weekly - 2 sets - 10 reps Seated Shoulder Horizontal Abduction with Resistance - 1 x daily - 7 x weekly - 2 sets - 10 reps Seated Shoulder Row with Resistance Anchored at Feet - 1 x daily - 7 x weekly - 2 sets - 10 reps Step Up - 1 x daily - 7 x weekly - 3 sets - 10 reps Lateral Step Up - 1 x daily - 7 x weekly - 3 sets - 10 reps Squat with Chair Touch - 1 x daily - 7 x weekly - 3 sets - 10 reps

## 2020-06-08 NOTE — Progress Notes (Signed)
ADVANCED HF CLINIC VISIT  PCP: Colon Branch, MD Primary Cardiologist:  HPI:  Kevin Baxter is a 50 y.o. male with HTN, morbid obesity, pancreatitis, DVT with submassive PE and systolic HF due to NICM.  Admitted in 3/22 for intractable n/v. Initially felt to have acute cholecystitis. Echo showed EF 15% with mod RV dysfunction. + large LV clot. Started on inotropes. Cath showed normal coronaries. cMRI LVEF 12% RVEF 21%. No significant LGE.  Post -cath developed worsening CP and tachycardia. CT showed bilateral submassive PE with pulmonary infarct. Underwent catheter-based thrombectomy of PE. Milrinone slowly weaned off. Discharged 04/24/20.   Presents for f/u. Feeling much stronger. Now walking 5K steps per day. Just finished PT and about to enroll in CR. Denies edema, orthopnea or PND. Taking torsemide about 1x/week  No bleeding with Eliquis.  BP 110-115/70s  Echo today 06/09/20 EF 25-30% RV mild HK ? Small laminated apical clot Personally reviewed   ROS: All systems negative except as listed in HPI, PMH and Problem List.  SH:  Social History   Socioeconomic History  . Marital status: Married    Spouse name: Not on file  . Number of children: 2  . Years of education: Not on file  . Highest education level: Not on file  Occupational History  . Occupation: works for [employer]  . Smoking status: Never Smoker  . Smokeless tobacco: Never Used  Vaping Use  . Vaping Use: Never used  Substance and Sexual Activity  . Alcohol use: Yes    Comment: very rare   . Drug use: No  . Sexual activity: Not on file  Other Topics Concern  . Not on file  Social History Narrative   Household: pt, wife daughter (2013),  boy  (2006),    Social Determinants of Radio broadcast assistant Strain: Not on file  Food Insecurity: Not on file  Transportation Needs: Not on file  Physical Activity: Not on file  Stress: Not on file  Social Connections: Not on file  Intimate  Partner Violence: Not on file    FH:  Family History  Problem Relation Age of Onset  . Cancer Mother        CERVICAL  . Hypertension Father   . Heart failure Father        no MI, d/t etoh  . Diabetes Sister   . Diabetes Sister   . Diabetes Paternal Aunt   . Diabetes Cousin   . Colon cancer Neg Hx   . Prostate cancer Neg Hx   . Stroke Neg Hx   . Esophageal cancer Neg Hx   . Rectal cancer Neg Hx   . Stomach cancer Neg Hx     Past Medical History:  Diagnosis Date  . Allergy   . Anxiety   . Arthritis   . CHF (congestive heart failure) ([place])   . Fundic gland polyposis of stomach   . GERD with esophagitis   . Gout   . Hx of adenomatous colonic polyps   . Hypertension     Current Outpatient Medications  Medication Sig Dispense Refill  . allopurinol (ZYLOPRIM) 300 MG tablet Take 1 tablet (300 mg total) by mouth daily. 30 tablet 0  . apixaban (ELIQUIS) 5 MG TABS tablet Take 5 mg by mouth 2 (two) times daily.    Marland Kitchen dicyclomine (BENTYL) 20 MG tablet Take 1 tablet (20 mg total) by mouth every 6 (six) hours as  needed for spasms. 90 tablet 0  . digoxin (LANOXIN) 0.125 MG tablet TAKE 1 TABLET BY MOUTH DAILY 90 tablet 3  . ivabradine (CORLANOR) 7.5 MG TABS tablet Take 1 tablet (7.5 mg total) by mouth 2 (two) times daily with a meal. 180 tablet 3  . ondansetron (ZOFRAN ODT) 4 MG disintegrating tablet Take 1 tablet (4 mg total) by mouth every 8 (eight) hours as needed for nausea or vomiting. 20 tablet 0  . pantoprazole (PROTONIX) 40 MG tablet Take 1 tablet (40 mg total) by mouth daily. 30 tablet 3  . sacubitril-valsartan (ENTRESTO) 24-26 MG Take 1 tablet by mouth 2 (two) times daily. 60 tablet 6  . sertraline (ZOLOFT) 50 MG tablet TAKE 1 TABLET BY MOUTH EVERY DAY 30 tablet 0  . spironolactone (ALDACTONE) 25 MG tablet Take 0.5 tablets (12.5 mg total) by mouth daily. 30 tablet 6  . sucralfate (CARAFATE) 1 g tablet Take 1 tablet (1 g total) by mouth 4 (four) times daily -  with meals and at  bedtime. 120 tablet 0  . torsemide (DEMADEX) 20 MG tablet Take 1 tablet (20 mg total) by mouth daily as needed. 30 tablet 6   No current facility-administered medications for this encounter.    Vitals:   06/09/20 0942  BP: 140/80  Pulse: 90  SpO2: 98%  Weight: 120.1 kg (264 lb 12.8 oz)    PHYSICAL EXAM:  General:  Well appearing. No resp difficulty HEENT: normal Neck: supple. no JVD. Carotids 2+ bilat; no bruits. No lymphadenopathy or thryomegaly appreciated. Cor: PMI nondisplaced. Regular rate & rhythm. No rubs, gallops or murmurs. Lungs: clear Abdomen: soft, nontender, nondistended. No hepatosplenomegaly. No bruits or masses. Good bowel sounds. Extremities: no cyanosis, clubbing, rash, edema Neuro: alert & orientedx3, cranial nerves grossly intact. moves all 4 extremities w/o difficulty. Affect pleasant    ASSESSMENT & PLAN:   1. Acute systolic biventricular CHF with cardiogenic shock: - ECHO 3/22  LVEF < 20% with large LV thrombus,mild to moderate RV dysfunction - Cardiac catheterization 04/16/20 with normal coronaries - cMRI LVEF 12% RVEF 21%. No significant LGE - also complicated by RV failure from PE - Echo today 06/09/20 EF 25-30% RV mild HK ? Small laminated apical clot Personally reviewed - Continues to improve. NYHA II  - Volume status ok - Continue digoxin 0.125  - Continue spironolactone 12.5 - Continue entresto 24/26 BID -Continue ivabradine 7.5 bid - Start jardiance 10 take torsemide prn - In 2 weeks start carvedilol 3.125 bid - labs today - start CR  2. LV thrombus: - Much improved on echo today  - Continue Eliquis. No bleeding   3. 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 Also R femoral DVT - Chest CT 3/22 bilateral submassive PE with RV strain and RUL infarct - s/p Penumbra clot extraction 04/16/20 - Continue Eliquis. No bleeding   Glori Bickers, MD  10:20 AM

## 2020-06-09 ENCOUNTER — Ambulatory Visit (HOSPITAL_COMMUNITY)
Admission: RE | Admit: 2020-06-09 | Discharge: 2020-06-09 | Disposition: A | Discharge status: Home / Self Care | Payer: 59 | Source: Ambulatory Visit | Attending: Internal Medicine | Admitting: Internal Medicine

## 2020-06-09 ENCOUNTER — Ambulatory Visit (HOSPITAL_BASED_OUTPATIENT_CLINIC_OR_DEPARTMENT_OTHER)
Admission: RE | Admit: 2020-06-09 | Discharge: 2020-06-09 | Disposition: A | Discharge status: Home / Self Care | Payer: 59 | Source: Ambulatory Visit | Attending: Internal Medicine | Admitting: Internal Medicine

## 2020-06-09 ENCOUNTER — Other Ambulatory Visit (HOSPITAL_COMMUNITY): Payer: Self-pay | Admitting: Internal Medicine

## 2020-06-09 ENCOUNTER — Ambulatory Visit (INDEPENDENT_AMBULATORY_CARE_PROVIDER_SITE_OTHER): Payer: 59 | Admitting: Licensed Clinical Social Worker

## 2020-06-09 ENCOUNTER — Other Ambulatory Visit (HOSPITAL_COMMUNITY): Payer: Self-pay | Admitting: Psychiatry

## 2020-06-09 ENCOUNTER — Ambulatory Visit (HOSPITAL_COMMUNITY): Payer: 59 | Admitting: Licensed Clinical Social Worker

## 2020-06-09 ENCOUNTER — Encounter (HOSPITAL_COMMUNITY): Payer: Self-pay | Admitting: Internal Medicine

## 2020-06-09 VITALS — BP 140/80 | HR 90 | Wt 264.8 lb

## 2020-06-09 DIAGNOSIS — I82411 Acute embolism and thrombosis of right femoral vein: Secondary | ICD-10-CM | POA: Insufficient documentation

## 2020-06-09 DIAGNOSIS — Z79899 Other long term (current) drug therapy: Secondary | ICD-10-CM | POA: Insufficient documentation

## 2020-06-09 DIAGNOSIS — I240 Acute coronary thrombosis not resulting in myocardial infarction: Secondary | ICD-10-CM | POA: Diagnosis not present

## 2020-06-09 DIAGNOSIS — I513 Intracardiac thrombosis, not elsewhere classified: Secondary | ICD-10-CM

## 2020-06-09 DIAGNOSIS — I2699 Other pulmonary embolism without acute cor pulmonale: Secondary | ICD-10-CM | POA: Diagnosis not present

## 2020-06-09 DIAGNOSIS — I5022 Chronic systolic (congestive) heart failure: Secondary | ICD-10-CM

## 2020-06-09 DIAGNOSIS — I5082 Biventricular heart failure: Secondary | ICD-10-CM | POA: Insufficient documentation

## 2020-06-09 DIAGNOSIS — F4323 Adjustment disorder with mixed anxiety and depressed mood: Secondary | ICD-10-CM | POA: Diagnosis not present

## 2020-06-09 DIAGNOSIS — Z7901 Long term (current) use of anticoagulants: Secondary | ICD-10-CM | POA: Diagnosis not present

## 2020-06-09 DIAGNOSIS — Z6834 Body mass index (BMI) 34.0-34.9, adult: Secondary | ICD-10-CM | POA: Diagnosis not present

## 2020-06-09 DIAGNOSIS — I110 Hypertensive heart disease with heart failure: Secondary | ICD-10-CM | POA: Diagnosis not present

## 2020-06-09 DIAGNOSIS — I2609 Other pulmonary embolism with acute cor pulmonale: Secondary | ICD-10-CM | POA: Diagnosis not present

## 2020-06-09 LAB — CBC
HCT: 46.7 % (ref 39.0–52.0)
Hemoglobin: 14.7 g/dL (ref 13.0–17.0)
MCH: 25.5 pg — ABNORMAL LOW (ref 26.0–34.0)
MCHC: 31.5 g/dL (ref 30.0–36.0)
MCV: 81.1 fL (ref 80.0–100.0)
Platelets: 249 10*3/uL (ref 150–400)
RBC: 5.76 MIL/uL (ref 4.22–5.81)
RDW: 20.7 % — ABNORMAL HIGH (ref 11.5–15.5)
WBC: 5.3 10*3/uL (ref 4.0–10.5)
nRBC: 0 % (ref 0.0–0.2)

## 2020-06-09 LAB — COMPREHENSIVE METABOLIC PANEL
ALT: 25 U/L (ref 0–44)
AST: 21 U/L (ref 15–41)
Albumin: 3.8 g/dL (ref 3.5–5.0)
Alkaline Phosphatase: 62 U/L (ref 38–126)
Anion gap: 8 (ref 5–15)
BUN: 16 mg/dL (ref 6–20)
CO2: 27 mmol/L (ref 22–32)
Calcium: 9.4 mg/dL (ref 8.9–10.3)
Chloride: 100 mmol/L (ref 98–111)
Creatinine, Ser: 1.14 mg/dL (ref 0.61–1.24)
GFR, Estimated: >60 mL/min (ref >60)
Glucose, Bld: 104 mg/dL — ABNORMAL HIGH (ref 70–99)
Potassium: 4.2 mmol/L (ref 3.5–5.1)
Sodium: 135 mmol/L (ref 135–145)
Total Bilirubin: 1 mg/dL (ref 0.3–1.2)
Total Protein: 7.5 g/dL (ref 6.5–8.1)

## 2020-06-09 LAB — DIGOXIN LEVEL: Digoxin Level: 0.7 ng/mL — ABNORMAL LOW (ref 0.8–2.0)

## 2020-06-09 LAB — ECHOCARDIOGRAM COMPLETE: S' Lateral: 5.1 cm

## 2020-06-09 LAB — BRAIN NATRIURETIC PEPTIDE: B Natriuretic Peptide: 77.8 pg/mL (ref 0.0–100.0)

## 2020-06-09 MED ORDER — EMPAGLIFLOZIN 10 MG PO TABS: 10.0000 mg | ORAL_TABLET | Freq: Every day | ORAL | 6 refills | Status: DC

## 2020-06-09 MED ORDER — CARVEDILOL 3.125 MG PO TABS: 3.1250 mg | ORAL_TABLET | Freq: Two times a day (BID) | ORAL | 6 refills | Status: DC

## 2020-06-09 NOTE — Patient Instructions (Signed)
START Jardiance 10mg  (1 tab) daily  START Carvedilol 3.125mg  (1 tab) twice a day. START in 2 weeks on(06/23/20)  STOP Torsemide  You have been referred to cardiac rehab. You will receive a call to schedule an appointment  Labs today We will only contact you if something comes back abnormal or we need to make some changes. Otherwise no news is good news!  Your physician recommends that you schedule a follow-up appointment in: 6-8 weeks with Dr Haroldine Laws  Please call office at [phone] option [phone] if you have any questions or concerns.   At the [facility], you and your health needs are our priority. As part of our continuing mission to provide you with exceptional heart care, we have created designated Provider Care Teams. These Care Teams include your primary Cardiologist (physician) and Advanced Practice Providers (APPs- Physician Assistants and Nurse Practitioners) who all work together to provide you with the care you need, when you need it.   You may see any of the following providers on your designated Care Team at your next follow up: Marland Kitchen Dr Glori Bickers . Dr Loralie Champagne . Dr Vickki Muff . Darrick Grinder, NP . Lyda Jester, [place] . Audry Riles, PharmD   Please be sure to bring in all your medications bottles to every appointment.

## 2020-06-09 NOTE — Progress Notes (Signed)
  Echocardiogram 2D Echocardiogram has been performed.  Jamoni Hewes G Jonnie Kubly 06/09/2020, 9:50 AM

## 2020-06-09 NOTE — Addendum Note (Signed)
Encounter addended by: Valeda Malm, RN on: 06/09/2020 10:45 AM  Actions taken: Medication long-term status modified, Pharmacy for encounter modified, Order list changed, Diagnosis association updated, Charge Capture section accepted, Clinical Note Signed

## 2020-06-10 ENCOUNTER — Encounter: Payer: 59 | Admitting: Rehabilitative and Restorative Service Providers"

## 2020-06-10 NOTE — Progress Notes (Signed)
   THERAPIST PROGRESS NOTE  Session Time: 1:00 pm-1:45 pm  Type of Therapy: Individual Therapy   Purpose of session: Kevin "Germain Osgood" will manage mood and anxiety as evidenced by challenging anxious thoughts, cope with daily stressors, not worry so much, and return to previous level of functioning/realistic thinking for 5 out of 7 days for 60 days.  Interventions: Therapist utilized CBT and Solution focused brief therapy to address mood and anxiety. Therapist provided support and empathy to patient as he shared his thoughts and feelings. Therapist processed patients news of improvement with his heart. Therapist worked with patient to identify the next steps to take with his physical health which impacts his mental health.   Effectiveness: Patient was oriented x5 (person, place, situation, time and object). Patient was casually dressed, and appropriately groomed. Patient was alert, engaged, pleasant, and cooperative during session. Patient got good feed back from his echocardiogram. He was told that he is making good improvements and is where he needs to be in his recovery. Patient noted this has reduced a lot of stress for him. Patient is hopeful for the future and is going to focus on taking small steps to build his strength up. Patient is going to focus on where he is at and not be in a rush to get back to normal. He doesn't want to end up overworking himself and slowing down his recovery. Patient plans to go back to work eventually but is trying to live his life without overwhelming fear.   Patient engaged in session. Patient responded well to interventions. Patient continues to meet criteria for Adjustment disorder with anxiety and depression. Patient will continue in outpatient therapy due to being the least restrictive service to meet his needs. Patient made moderate progress on his goals.   Suicidal/Homicidal: Negativewithout intent/plan  Plan: Return again in 1-2 weeks.  Diagnosis: Axis I:  Adjustment Disorder with Mixed Emotional Features    Axis II: No diagnosis    Glori Bickers, LCSW 06/10/2020

## 2020-06-11 ENCOUNTER — Other Ambulatory Visit (HOSPITAL_COMMUNITY): Payer: Self-pay | Admitting: *Deleted

## 2020-06-11 MED ORDER — EMPAGLIFLOZIN 10 MG PO TABS: 10.0000 mg | ORAL_TABLET | Freq: Every day | ORAL | 6 refills | Status: DC

## 2020-06-14 ENCOUNTER — Encounter (HOSPITAL_COMMUNITY): Payer: Self-pay | Admitting: Psychiatry

## 2020-06-14 ENCOUNTER — Telehealth (INDEPENDENT_AMBULATORY_CARE_PROVIDER_SITE_OTHER): Payer: 59 | Admitting: Psychiatry

## 2020-06-14 DIAGNOSIS — F064 Anxiety disorder due to known physiological condition: Secondary | ICD-10-CM

## 2020-06-14 DIAGNOSIS — F4323 Adjustment disorder with mixed anxiety and depressed mood: Secondary | ICD-10-CM

## 2020-06-14 MED ORDER — SERTRALINE HCL 50 MG PO TABS: 1.0000 | ORAL_TABLET | Freq: Every day | ORAL | 1 refills | Status: DC

## 2020-06-14 NOTE — Progress Notes (Signed)
[place] Follow up visit    Patient Identification: Kevin Baxter MRN:  [id number] Date of Evaluation:  06/14/2020 Referral Source: primary care Chief Complaint:   follow up anxiety Visit Diagnosis:    ICD-10-CM   1. Adjustment disorder with mixed anxiety and depressed mood  F43.23   2. Anxiety disorder due to known physiological condition  F06.4     Virtual Visit via Video Note  I connected with Kevin Baxter on 06/14/20 at  8:30 AM EDT by a video enabled telemedicine application and verified that I am speaking with the correct person using two identifiers.  Location: Patient: home Provider: home office   I discussed the limitations of evaluation and management by telemedicine and the availability of in person appointments. The patient expressed understanding and agreed to proceed.       I discussed the assessment and treatment plan with the patient. The patient was provided an opportunity to ask questions and all were answered. The patient agreed with the plan and demonstrated an understanding of the instructions.   The patient was advised to call back or seek an in-person evaluation if the symptoms worsen or if the condition fails to improve as anticipated.  I provided 10  minutes of non-face-to-face time during this encounter.     History of Present Illness:   Patient is a 50 years old currently married African-American male  Initially referred by primary care physician or therapist to establish care for anxiety.  He works full-time as an Administrator at [employer] but currently he is on disability because of his current medical comorbidity  Patient was diagnosed with congestive heart failure a month ago he was in the hospital during the hospital stay he developed lung clots.  Surgery was done to take care of it.  After that he has developed anxiety off fear of dying   Last visit started Zoloft now at a dose of 25 to 50 mg.  He is doing better anxiety is improved fear of dying  has improved He is got echocardiogram done and ejection fraction is also improved related with possible prior viral infection   Sleep energy level is improved also he feels content with the medication   He has good support from wife and family  Denies psychotic symptoms delusions or hallucinations No manic symptoms  Denies drug use  Aggravating factor: medical co morbidity, diagnosed with [facility] and blood clots while in hospital  Modifying factor: Family  Duration since march 2022  Past Psychiatric History: denies prior to current medical condition  Previous Psychotropic Medications: Yes   Substance Abuse History in the last 12 months:  No.  Consequences of Substance Abuse: NA  Past Medical History:  Past Medical History:  Diagnosis Date  . Allergy   . Anxiety   . Arthritis   . CHF (congestive heart failure) ([place])   . Fundic gland polyposis of stomach   . GERD with esophagitis   . Gout   . Hx of adenomatous colonic polyps   . Hypertension     Past Surgical History:  Procedure Laterality Date  . COLONOSCOPY  05/2019  . ESOPHAGOGASTRODUODENOSCOPY  05/2019  . HAND SURGERY Right    2006 for a FX  . IR ANGIOGRAM PULMONARY BILATERAL SELECTIVE  04/16/2020  . IR ANGIOGRAM SELECTIVE EACH ADDITIONAL VESSEL  04/16/2020  . IR ANGIOGRAM SELECTIVE EACH ADDITIONAL VESSEL  04/16/2020  . IR THROMBECT PRIM MECH INIT (INCLU) MOD SED  04/16/2020  . IR US GUIDE  VASC ACCESS LEFT  04/16/2020  . IR US GUIDE VASC ACCESS LEFT  04/16/2020  . IR US GUIDE VASC ACCESS RIGHT  04/16/2020  . RADIOLOGY WITH ANESTHESIA N/A 04/16/2020   Procedure: IR WITH ANESTHESIA- PULMONARY EMBOLUS INTERVENTION;  Surgeon: Suzette Battiest, MD;  Location: [facility];  Service: Radiology;  Laterality: N/A;  . RIGHT/LEFT HEART CATH AND CORONARY ANGIOGRAPHY N/A 04/15/2020   Procedure: RIGHT/LEFT HEART CATH AND CORONARY ANGIOGRAPHY;  Surgeon: Jolaine Artist, MD;  Location: [facility];  Service: Cardiovascular;   Laterality: N/A;    Family Psychiatric History: Nephew: schizophrenia  Family History:  Family History  Problem Relation Age of Onset  . Cancer Mother        CERVICAL  . Hypertension Father   . Heart failure Father        no MI, d/t etoh  . Diabetes Sister   . Diabetes Sister   . Diabetes Paternal Aunt   . Diabetes Cousin   . Colon cancer Neg Hx   . Prostate cancer Neg Hx   . Stroke Neg Hx   . Esophageal cancer Neg Hx   . Rectal cancer Neg Hx   . Stomach cancer Neg Hx     Social History:   Social History   Socioeconomic History  . Marital status: Married    Spouse name: Not on file  . Number of children: 2  . Years of education: Not on file  . Highest education level: Not on file  Occupational History  . Occupation: works for [employer]  . Smoking status: Never Smoker  . Smokeless tobacco: Never Used  Vaping Use  . Vaping Use: Never used  Substance and Sexual Activity  . Alcohol use: Yes    Comment: very rare   . Drug use: No  . Sexual activity: Not on file  Other Topics Concern  . Not on file  Social History Narrative   Household: pt, wife daughter (2013),  boy  (2006),    Social Determinants of Radio broadcast assistant Strain: Not on file  Food Insecurity: Not on file  Transportation Needs: Not on file  Physical Activity: Not on file  Stress: Not on file  Social Connections: Not on file     Allergies:   Allergies  Allergen Reactions  . Pseudoephedrine     REACTION: jittery    Metabolic Disorder Labs: Lab Results  Component Value Date   HGBA1C 6.3 (H) 04/11/2020   MPG 134.11 04/11/2020   No results found for: PROLACTIN Lab Results  Component Value Date   CHOL 188 12/03/2018   TRIG 60 02/27/2020   HDL 50.10 12/03/2018   CHOLHDL 4 12/03/2018   VLDL 29.8 12/03/2018   LDLCALC 108 (H) 12/03/2018   LDLCALC 113 (H) 01/01/2017   Lab Results  Component Value Date   TSH 2.246 04/12/2020    Therapeutic Level Labs: No results  found for: LITHIUM No results found for: CBMZ No results found for: VALPROATE  Current Medications: Current Outpatient Medications  Medication Sig Dispense Refill  . allopurinol (ZYLOPRIM) 300 MG tablet Take 1 tablet (300 mg total) by mouth daily. 30 tablet 0  . apixaban (ELIQUIS) 5 MG TABS tablet Take 5 mg by mouth 2 (two) times daily.    Derrill Memo ON 06/23/2020] carvedilol (COREG) 3.125 MG tablet Take 1 tablet (3.125 mg total) by mouth 2 (two) times daily with a meal. 60 tablet 6  . dicyclomine (BENTYL) 20 MG tablet  Take 1 tablet (20 mg total) by mouth every 6 (six) hours as needed for spasms. 90 tablet 0  . digoxin (LANOXIN) 0.125 MG tablet TAKE 1 TABLET BY MOUTH DAILY 90 tablet 3  . empagliflozin (JARDIANCE) 10 MG TABS tablet Take 1 tablet (10 mg total) by mouth daily. 30 tablet 6  . ivabradine (CORLANOR) 7.5 MG TABS tablet Take 1 tablet (7.5 mg total) by mouth 2 (two) times daily with a meal. 180 tablet 3  . ondansetron (ZOFRAN ODT) 4 MG disintegrating tablet Take 1 tablet (4 mg total) by mouth every 8 (eight) hours as needed for nausea or vomiting. 20 tablet 0  . pantoprazole (PROTONIX) 40 MG tablet Take 1 tablet (40 mg total) by mouth daily. 30 tablet 3  . sacubitril-valsartan (ENTRESTO) 24-26 MG Take 1 tablet by mouth 2 (two) times daily. 60 tablet 6  . sertraline (ZOLOFT) 50 MG tablet Take 1 tablet (50 mg total) by mouth daily. 30 tablet 1  . spironolactone (ALDACTONE) 25 MG tablet Take 0.5 tablets (12.5 mg total) by mouth daily. 30 tablet 6  . sucralfate (CARAFATE) 1 g tablet Take 1 tablet (1 g total) by mouth 4 (four) times daily -  with meals and at bedtime. 120 tablet 0   No current facility-administered medications for this visit.     Psychiatric Specialty Exam: Review of Systems  Cardiovascular: Negative for chest pain.    There were no vitals taken for this visit.There is no height or weight on file to calculate BMI.  General Appearance: Casual  Eye Contact:  Fair   Speech:  Normal Rate  Volume:  Normal  Mood:  Euthymic  Affect:  Congruent  Thought Process:  Goal Directed  Orientation:  Full (Time, Place, and Person)  Thought Content:  Rumination  Suicidal Thoughts:  No  Homicidal Thoughts:  No  Memory:  Immediate;   Fair Recent;   Fair  Judgement:  Fair  Insight:  Fair  Psychomotor Activity:  Normal  Concentration:  Concentration: Fair and Attention Span: Fair  Recall:  Good  Fund of Knowledge:Good  Language: Fair  Akathisia:  No  Handed:  AIMS (if indicated):  not done  Assets:  Communication Skills Desire for Improvement  ADL's:  Intact  Cognition: WNL  Sleep:  variable   Screenings: PHQ2-9   Flowsheet Row Video Visit from 05/17/2020 in [facility] from 05/04/2020 in [facility] Office Visit from 03/02/2020 in [facility] at [place] Visit from 05/22/2019 in [facility] at [facility] Visit from 04/03/2018 in [facility] at [facility]  PHQ-2 Total Score 1 2 6  0 0  PHQ-9 Total Score 6 10 23  -- --    Flowsheet Row Video Visit from 06/14/2020 in [facility] ED from 05/19/2020 in [facility] Video Visit from 05/17/2020 in [facility] No Risk No Risk No Risk      Assessment and Plan: as follows  Prior documentation reviewed Adjustment disorder with depressed mood and anxiety: Depression has improved continue therapy continue sertraline at a dose of 50 mg  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

## 2020-06-15 ENCOUNTER — Encounter: Payer: 59 | Admitting: Physical Therapy

## 2020-06-16 ENCOUNTER — Ambulatory Visit (INDEPENDENT_AMBULATORY_CARE_PROVIDER_SITE_OTHER): Payer: 59 | Admitting: Licensed Clinical Social Worker

## 2020-06-16 ENCOUNTER — Other Ambulatory Visit (HOSPITAL_COMMUNITY): Payer: Self-pay

## 2020-06-16 DIAGNOSIS — F4323 Adjustment disorder with mixed anxiety and depressed mood: Secondary | ICD-10-CM | POA: Diagnosis not present

## 2020-06-17 ENCOUNTER — Other Ambulatory Visit (HOSPITAL_COMMUNITY): Payer: Self-pay

## 2020-06-17 ENCOUNTER — Encounter: Payer: 59 | Admitting: Physical Therapy

## 2020-06-17 ENCOUNTER — Other Ambulatory Visit: Payer: Self-pay | Admitting: Family Medicine

## 2020-06-17 DIAGNOSIS — K219 Gastro-esophageal reflux disease without esophagitis: Secondary | ICD-10-CM
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
## 2020-06-18 ENCOUNTER — Telehealth (HOSPITAL_COMMUNITY): Payer: Self-pay | Admitting: Pharmacy Technician

## 2020-06-18 ENCOUNTER — Other Ambulatory Visit (HOSPITAL_COMMUNITY): Payer: Self-pay

## 2020-06-18 NOTE — Telephone Encounter (Signed)
Advanced Heart Failure Patient Advocate Encounter  Prior Authorization for Kevin Baxter has been submitted and approved.    PA# [id number] Effective dates: 06/17/20 through 06/17/21  Patients co-pay is $0  Charlann Boxer, CPhT

## 2020-06-21 ENCOUNTER — Encounter (HOSPITAL_COMMUNITY): Payer: Self-pay | Admitting: *Deleted

## 2020-06-21 NOTE — Progress Notes (Signed)
Received disability form from [employer]  Form completed and signed by Dr Haroldine Laws and faxed along with records from 06/09/20 to present to [employer] at [phone]  mychart message sent to pt to notify him this has been done

## 2020-06-22 ENCOUNTER — Telehealth: Payer: Self-pay | Admitting: Internal Medicine

## 2020-06-22 ENCOUNTER — Encounter: Payer: 59 | Admitting: Physical Therapy

## 2020-06-22 NOTE — Telephone Encounter (Signed)
Patient is calling in regards to a no show fee for 03/30/20. Patient stated he was late. Wondering if it could be removed

## 2020-06-23 ENCOUNTER — Ambulatory Visit (INDEPENDENT_AMBULATORY_CARE_PROVIDER_SITE_OTHER): Payer: 59 | Admitting: Licensed Clinical Social Worker

## 2020-06-23 DIAGNOSIS — F4323 Adjustment disorder with mixed anxiety and depressed mood: Secondary | ICD-10-CM

## 2020-06-24 ENCOUNTER — Encounter: Payer: 59 | Admitting: Physical Therapy
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
## 2020-06-25 NOTE — Telephone Encounter (Signed)
I left a detailed message on patient's voice mail letting him know I am sending an email to [employer] team to remove the no show fee for DOS 03/30/20 with Dr. Larose Kells.

## 2020-06-26 ENCOUNTER — Other Ambulatory Visit: Payer: Self-pay | Admitting: Internal Medicine

## 2020-06-29 ENCOUNTER — Encounter: Payer: 59 | Admitting: Physical Therapy

## 2020-07-01 ENCOUNTER — Other Ambulatory Visit: Payer: Self-pay | Admitting: Internal Medicine

## 2020-07-02 ENCOUNTER — Other Ambulatory Visit (HOSPITAL_COMMUNITY): Payer: Self-pay

## 2020-07-02 MED ORDER — SERTRALINE HCL 50 MG PO TABS: 1.0000 | ORAL_TABLET | Freq: Every day | ORAL | 0 refills | Status: DC

## 2020-07-08 ENCOUNTER — Encounter (HOSPITAL_COMMUNITY): Payer: Self-pay

## 2020-07-08 ENCOUNTER — Other Ambulatory Visit: Payer: Self-pay | Admitting: Physician Assistant

## 2020-07-08 ENCOUNTER — Telehealth (HOSPITAL_COMMUNITY): Payer: Self-pay

## 2020-07-08 NOTE — Telephone Encounter (Signed)
Attempted to call patient in regards to Cardiac Rehab - LM on VM Mailed letter 

## 2020-07-08 NOTE — Telephone Encounter (Signed)
Eliquis 5mg  refill request received. Patient is 50 years old, weight-120.1kg, Crea-1.14 on 06/09/2020, Diagnosis-Afib, and last seen by Dr. Haroldine Laws on 06/09/2020. Dose is appropriate based on dosing criteria. Will send in refill to requested pharmacy.

## 2020-07-21 ENCOUNTER — Other Ambulatory Visit: Payer: Self-pay | Admitting: Internal Medicine

## 2020-07-28 ENCOUNTER — Encounter (HOSPITAL_COMMUNITY): Payer: Self-pay | Admitting: *Deleted

## 2020-07-28 NOTE — Progress Notes (Signed)
Pt's leave of absence form completed, signed by Dr Haroldine Laws, and faxed to [facility] at [phone]  Also received record request from [employer] requesting records from 07/19/20 to present, form faxed back that there are no records for that time period

## 2020-07-29 ENCOUNTER — Telehealth (HOSPITAL_COMMUNITY): Payer: Self-pay

## 2020-08-01 ENCOUNTER — Other Ambulatory Visit: Payer: Self-pay | Admitting: Internal Medicine

## 2020-08-03 ENCOUNTER — Encounter (HOSPITAL_COMMUNITY)
Admission: RE | Admit: 2020-08-03 | Discharge: 2020-08-03 | Disposition: A | Discharge status: Home / Self Care | Payer: 59 | Source: Ambulatory Visit | Attending: Internal Medicine | Admitting: Internal Medicine

## 2020-08-03 ENCOUNTER — Encounter (HOSPITAL_COMMUNITY): Payer: Self-pay

## 2020-08-03 ENCOUNTER — Other Ambulatory Visit: Payer: Self-pay | Admitting: Internal Medicine

## 2020-08-03 ENCOUNTER — Other Ambulatory Visit: Payer: Self-pay

## 2020-08-03 VITALS — BP 124/78 | HR 93 | Ht 72.5 in | Wt 276.2 lb

## 2020-08-03 DIAGNOSIS — R9430 Abnormal result of cardiovascular function study, unspecified: Secondary | ICD-10-CM | POA: Insufficient documentation

## 2020-08-03 DIAGNOSIS — I5022 Chronic systolic (congestive) heart failure: Secondary | ICD-10-CM

## 2020-08-03 DIAGNOSIS — I509 Heart failure, unspecified: Secondary | ICD-10-CM | POA: Insufficient documentation

## 2020-08-03 NOTE — Progress Notes (Signed)
Cardiac Individual Treatment Plan  Patient Details  Name: Kevin Baxter MRN: [id number] Date of Birth: Feb 26, 1970 Referring Provider:   Flowsheet Row CARDIAC REHAB PHASE II ORIENTATION from 08/03/2020 in [place]  Referring Provider Bensimhon, [name] Pascal, MD       Initial Encounter Date:  [place] PHASE II ORIENTATION from 08/03/2020 in [place]  Date 08/03/20       Visit Diagnosis: 04/11/20 CHF  Patient's Home Medications on Admission:  Current Outpatient Medications:    allopurinol (ZYLOPRIM) 300 MG tablet, Take 1 tablet (300 mg total) by mouth daily., Disp: 15 tablet, Rfl: 0   apixaban (ELIQUIS) 5 MG TABS tablet, Take 1 tablet (5 mg total) by mouth 2 (two) times daily., Disp: 180 tablet, Rfl: 1   carvedilol (COREG) 3.125 MG tablet, Take 1 tablet (3.125 mg total) by mouth 2 (two) times daily with a meal., Disp: 60 tablet, Rfl: 6   digoxin (LANOXIN) 0.125 MG tablet, TAKE 1 TABLET BY MOUTH DAILY, Disp: 90 tablet, Rfl: 3   empagliflozin (JARDIANCE) 10 MG TABS tablet, Take 1 tablet (10 mg total) by mouth daily., Disp: 30 tablet, Rfl: 6   ivabradine (CORLANOR) 7.5 MG TABS tablet, Take 1 tablet (7.5 mg total) by mouth 2 (two) times daily with a meal., Disp: 180 tablet, Rfl: 3   ondansetron (ZOFRAN ODT) 4 MG disintegrating tablet, Take 1 tablet (4 mg total) by mouth every 8 (eight) hours as needed for nausea or vomiting., Disp: 20 tablet, Rfl: 0   pantoprazole (PROTONIX) 40 MG tablet, Take 1 tablet (40 mg total) by mouth daily., Disp: 90 tablet, Rfl: 0   sacubitril-valsartan (ENTRESTO) 24-26 MG, Take 1 tablet by mouth 2 (two) times daily., Disp: 60 tablet, Rfl: 6   sertraline (ZOLOFT) 50 MG tablet, Take 1 tablet (50 mg total) by mouth daily., Disp: 90 tablet, Rfl: 0   spironolactone (ALDACTONE) 25 MG tablet, Take 0.5 tablets (12.5 mg total) by mouth daily., Disp: 30 tablet, Rfl: 6   dicyclomine (BENTYL)  20 MG tablet, Take 1 tablet (20 mg total) by mouth every 6 (six) hours as needed for spasms. (Patient not taking: Reported on 07/29/2020), Disp: 90 tablet, Rfl: 0   sucralfate (CARAFATE) 1 g tablet, TAKE 1 TABLET (1 G TOTAL) BY MOUTH 4 (FOUR) TIMES DAILY - WITH MEALS AND AT BEDTIME., Disp: 120 tablet, Rfl: 0  Past Medical History: Past Medical History:  Diagnosis Date   Allergy    Anxiety    Arthritis    CHF (congestive heart failure) (HCC)    Fundic gland polyposis of stomach    GERD with esophagitis    Gout    Hx of adenomatous colonic polyps    Hypertension     Tobacco Use: Social History   Tobacco Use  Smoking Status Never  Smokeless Tobacco Never    Labs: Recent Review Flowsheet Data     Labs for ITP Cardiac and Pulmonary Rehab Latest Ref Rng & Units 04/21/2020 04/22/2020 04/23/2020 04/24/2020 04/24/2020   Cholestrol 0 - 200 mg/dL - - - - -   LDLCALC 0 - 99 mg/dL - - - - -   HDL >39.00 mg/dL - - - - -   Trlycerides <150 mg/dL - - - - -   Hemoglobin A1c 4.8 - 5.6 % - - - - -   PHART 7.350 - 7.450 - - - - -   PCO2ART 32.0 - 48.0 mmHg - - - - -  HCO3 20.0 - 28.0 mmol/L - - - - -   TCO2 22 - 32 mmol/L - - - - -   O2SAT % 56.9 56.7 62.1 50.5 56.2       Capillary Blood Glucose: No results found for: GLUCAP   Exercise Target Goals: Exercise Program Goal: Individual exercise prescription set using results from initial 6 min walk test and THRR while considering  patient's activity barriers and safety.   Exercise Prescription Goal: Starting with aerobic activity 30 plus minutes a day, 3 days per week for initial exercise prescription. Provide home exercise prescription and guidelines that participant acknowledges understanding prior to discharge.  Activity Barriers & Risk Stratification:  Activity Barriers & Cardiac Risk Stratification - 08/03/20 1330       Activity Barriers & Cardiac Risk Stratification   Activity Barriers None    Cardiac Risk Stratification High              6 Minute Walk:  6 Minute Walk     Row Name 08/03/20 1342         6 Minute Walk   Phase Initial     Distance 1684 feet     Walk Time 6 minutes     # of Rest Breaks 0     MPH 3.19     METS 4.24     RPE 9     Perceived Dyspnea  0     VO2 Peak 14.84     Symptoms No     Resting HR 93 bpm     Resting BP 124/78     Resting Oxygen Saturation  98 %     Exercise Oxygen Saturation  during 6 min walk 98 %     Max Ex. HR 110 bpm     Max Ex. BP 142/82     2 Minute Post BP 128/82              Oxygen Initial Assessment:   Oxygen Re-Evaluation:   Oxygen Discharge (Final Oxygen Re-Evaluation):   Initial Exercise Prescription:  Initial Exercise Prescription - 08/03/20 1400       Date of Initial Exercise RX and Referring Provider   Date 08/03/20    Referring Provider Bensimhon, [name] Pascal, MD    Expected Discharge Date 10/01/20      Treadmill   MPH 2.8    Grade 0    Minutes 15    METs 3.14      T5 Nustep   Level 3    SPM 85    Minutes 15    METs 3      Prescription Details   Frequency (times per week) 3    Duration Progress to 30 minutes of continuous aerobic without signs/symptoms of physical distress      Intensity   THRR 40-80% of Max Heartrate 68-135    Ratings of Perceived Exertion 11-13    Perceived Dyspnea 0-4      Progression   Progression Continue to progress workloads to maintain intensity without signs/symptoms of physical distress.      Resistance Training   Training Prescription Yes    Weight 5 lbs    Reps 10-15             Perform Capillary Blood Glucose checks as needed.  Exercise Prescription Changes:   Exercise Comments:   Exercise Goals and Review:   Exercise Goals     Row Name 08/03/20 1340  Exercise Goals   Increase Physical Activity Yes       Intervention Provide advice, education, support and counseling about physical activity/exercise needs.;Develop an individualized exercise  prescription for aerobic and resistive training based on initial evaluation findings, risk stratification, comorbidities and participant's personal goals.       Expected Outcomes Short Term: Attend rehab on a regular basis to increase amount of physical activity.;Long Term: Exercising regularly at least 3-5 days a week.;Long Term: Add in home exercise to make exercise part of routine and to increase amount of physical activity.       Increase Strength and Stamina Yes       Intervention Provide advice, education, support and counseling about physical activity/exercise needs.;Develop an individualized exercise prescription for aerobic and resistive training based on initial evaluation findings, risk stratification, comorbidities and participant's personal goals.       Expected Outcomes Short Term: Increase workloads from initial exercise prescription for resistance, speed, and METs.;Short Term: Perform resistance training exercises routinely during rehab and add in resistance training at home;Long Term: Improve cardiorespiratory fitness, muscular endurance and strength as measured by increased METs and functional capacity (6MWT)       Able to understand and use rate of perceived exertion (RPE) scale Yes       Intervention Provide education and explanation on how to use RPE scale       Expected Outcomes Short Term: Able to use RPE daily in rehab to express subjective intensity level;Long Term:  Able to use RPE to guide intensity level when exercising independently       Knowledge and understanding of Target Heart Rate Range (THRR) Yes       Intervention Provide education and explanation of THRR including how the numbers were predicted and where they are located for reference       Expected Outcomes Short Term: Able to state/look up THRR;Long Term: Able to use THRR to govern intensity when exercising independently;Short Term: Able to use daily as guideline for intensity in rehab       Able to check pulse  independently Yes       Intervention Provide education and demonstration on how to check pulse in carotid and radial arteries.;Review the importance of being able to check your own pulse for safety during independent exercise       Expected Outcomes Short Term: Able to explain why pulse checking is important during independent exercise;Long Term: Able to check pulse independently and accurately       Understanding of Exercise Prescription Yes       Intervention Provide education, explanation, and written materials on patient's individual exercise prescription       Expected Outcomes Short Term: Able to explain program exercise prescription;Long Term: Able to explain home exercise prescription to exercise independently                Exercise Goals Re-Evaluation :    Discharge Exercise Prescription (Final Exercise Prescription Changes):   Nutrition:  Target Goals: Understanding of nutrition guidelines, daily intake of sodium 1500mg , cholesterol 200mg , calories 30% from fat and 7% or less from saturated fats, daily to have 5 or more servings of fruits and vegetables.  Biometrics:  Pre Biometrics - 08/03/20 1318       Pre Biometrics   Waist Circumference 49 inches    Hip Circumference 51 inches    Waist to Hip Ratio 0.96 %    Triceps Skinfold 31 mm    % Body Fat 37.1 %  Grip Strength 52.5 kg    Flexibility 14.63 in    Single Leg Stand 30 seconds              Nutrition Therapy Plan and Nutrition Goals:   Nutrition Assessments:  MEDIFICTS Score Key: ?70 Need to make dietary changes  40-70 Heart Healthy Diet ? 40 Therapeutic Level Cholesterol Diet   Picture Your Plate Scores: <55 Unhealthy dietary pattern with much room for improvement. 41-50 Dietary pattern unlikely to meet recommendations for good health and room for improvement. 51-60 More healthful dietary pattern, with some room for improvement.  >60 Healthy dietary pattern, although there may be some  specific behaviors that could be improved.    Nutrition Goals Re-Evaluation:   Nutrition Goals Discharge (Final Nutrition Goals Re-Evaluation):   Psychosocial: Target Goals: Acknowledge presence or absence of significant depression and/or stress, maximize coping skills, provide positive support system. Participant is able to verbalize types and ability to use techniques and skills needed for reducing stress and depression.  Initial Review & Psychosocial Screening:  Initial Psych Review & Screening - 08/03/20 1424       Initial Review   Current issues with History of Depression;Current Stress Concerns    Source of Stress Concerns Chronic Illness;Unable to participate in former interests or hobbies    Comments Germain Osgood admits to being depressed and having anxiety regarding his recent CHF diagnosis and recent hospitalization. Berneta Sages is currently receiving couselling and is on an antidepressant and sees a psychiatrist.      Family Dynamics   Good Support System? Yes   Germain Osgood has his wife and children for support     Barriers   Psychosocial barriers to participate in program The patient should benefit from training in stress management and relaxation.      Screening Interventions   Interventions Encouraged to exercise;Provide feedback about the scores to participant;To provide support and resources with identified psychosocial needs    Expected Outcomes Long Term Goal: Stressors or current issues are controlled or eliminated.;Short Term goal: Identification and review with participant of any Quality of Life or Depression concerns found by scoring the questionnaire.             Quality of Life Scores:  Quality of Life - 08/03/20 1423       Quality of Life   Select Quality of Life      Quality of Life Scores   Health/Function Pre 15.9 %    Socioeconomic Pre 23.14 %    Psych/Spiritual Pre 13.79 %    Family Pre 20.4 %    GLOBAL Pre 17.62 %            Scores of 19 and  below usually indicate a poorer quality of life in these areas.  A difference of  2-3 points is a clinically meaningful difference.  A difference of 2-3 points in the total score of the Quality of Life Index has been associated with significant improvement in overall quality of life, self-image, physical symptoms, and general health in studies assessing change in quality of life.  PHQ-9: Recent Review Flowsheet Data     Depression screen [facility] 2/9 08/03/2020 05/17/2020 05/04/2020 03/02/2020 03/02/2020   Decreased Interest 0 0 0 3 0   Down, Depressed, Hopeless 1 1 2 3  0   PHQ - 2 Score 1 1 2 6  0   Altered sleeping - 2 1 3  -   Tired, decreased energy - 1 3 3  -   Change in appetite -  1 1 3  -   Feeling bad or failure about yourself  - 0 1 3 -   Trouble concentrating - 0 1 3 -   Moving slowly or fidgety/restless - 1 1 2  -   Suicidal thoughts - 0 0 0 -   PHQ-9 Score - 6 10 23  -   Difficult doing work/chores - - Somewhat difficult Somewhat difficult -      Interpretation of Total Score  Total Score Depression Severity:  1-4 = Minimal depression, 5-9 = Mild depression, 10-14 = Moderate depression, 15-19 = Moderately severe depression, 20-27 = Severe depression   Psychosocial Evaluation and Intervention:   Psychosocial Re-Evaluation:   Psychosocial Discharge (Final Psychosocial Re-Evaluation):   Vocational Rehabilitation: Provide vocational rehab assistance to qualifying candidates.   Vocational Rehab Evaluation & Intervention:  Vocational Rehab - 08/03/20 1430       Initial Vocational Rehab Evaluation & Intervention   Assessment shows need for Vocational Rehabilitation No   Germain Osgood is on short term disabity from his job and does not need vocational rehab at this time.            Education: Education Goals: Education classes will be provided on a weekly basis, covering required topics. Participant will state understanding/return demonstration of topics presented.  Learning  Barriers/Preferences:  Learning Barriers/Preferences - 08/03/20 1632       Learning Barriers/Preferences   Learning Barriers Sight   reading glasses   Learning Preferences Pictoral;Skilled Demonstration;Video             Education Topics: Hypertension, Hypertension Reduction -Define heart disease and high blood pressure. Discus how high blood pressure affects the body and ways to reduce high blood pressure.   Exercise and Your Heart -Discuss why it is important to exercise, the FITT principles of exercise, normal and abnormal responses to exercise, and how to exercise safely.   Angina -Discuss definition of angina, causes of angina, treatment of angina, and how to decrease risk of having angina.   Cardiac Medications -Review what the following cardiac medications are used for, how they affect the body, and side effects that may occur when taking the medications.  Medications include Aspirin, Beta blockers, calcium channel blockers, ACE Inhibitors, angiotensin receptor blockers, diuretics, digoxin, and antihyperlipidemics.   Congestive Heart Failure -Discuss the definition of CHF, how to live with CHF, the signs and symptoms of CHF, and how keep track of weight and sodium intake.   Heart Disease and Intimacy -Discus the effect sexual activity has on the heart, how changes occur during intimacy as we age, and safety during sexual activity.   Smoking Cessation / COPD -Discuss different methods to quit smoking, the health benefits of quitting smoking, and the definition of COPD.   Nutrition I: Fats -Discuss the types of cholesterol, what cholesterol does to the heart, and how cholesterol levels can be controlled.   Nutrition II: Labels -Discuss the different components of food labels and how to read food label   Heart Parts/Heart Disease and PAD -Discuss the anatomy of the heart, the pathway of blood circulation through the heart, and these are affected by heart  disease.   Stress I: Signs and Symptoms -Discuss the causes of stress, how stress may lead to anxiety and depression, and ways to limit stress.   Stress II: Relaxation -Discuss different types of relaxation techniques to limit stress.   Warning Signs of Stroke / TIA -Discuss definition of a stroke, what the signs and symptoms are of a stroke,  and how to identify when someone is having stroke.   Knowledge Questionnaire Score:  Knowledge Questionnaire Score - 08/03/20 1416       Knowledge Questionnaire Score   Pre Score 23/24             Core Components/Risk Factors/Patient Goals at Admission:  Personal Goals and Risk Factors at Admission - 08/03/20 1330       Core Components/Risk Factors/Patient Goals on Admission    Weight Management Yes;Obesity    Intervention Weight Management/Obesity: Establish reasonable short term and long term weight goals.;Obesity: Provide education and appropriate resources to help participant work on and attain dietary goals.    Admit Weight 276 lb 3.8 oz (125.3 kg)    Goal Weight: Short Term 268 lb (121.6 kg)    Goal Weight: Long Term 235 lb (106.6 kg)    Expected Outcomes Short Term: Continue to assess and modify interventions until short term weight is achieved;Long Term: Adherence to nutrition and physical activity/exercise program aimed toward attainment of established weight goal;Weight Loss: Understanding of general recommendations for a balanced deficit meal plan, which promotes 1-2 lb weight loss per week and includes a negative energy balance of [phone] kcal/d    Heart Failure Yes    Intervention Provide a combined exercise and nutrition program that is supplemented with education, support and counseling about heart failure. Directed toward relieving symptoms such as shortness of breath, decreased exercise tolerance, and extremity edema.    Expected Outcomes Improve functional capacity of life;Short term: Attendance in program 2-3 days a  week with increased exercise capacity. Reported lower sodium intake. Reported increased fruit and vegetable intake. Reports medication compliance.;Short term: Daily weights obtained and reported for increase. Utilizing diuretic protocols set by physician.;Long term: Adoption of self-care skills and reduction of barriers for early signs and symptoms recognition and intervention leading to self-care maintenance.    Hypertension Yes    Intervention Provide education on lifestyle modifcations including regular physical activity/exercise, weight management, moderate sodium restriction and increased consumption of fresh fruit, vegetables, and low fat dairy, alcohol moderation, and smoking cessation.;Monitor prescription use compliance.    Expected Outcomes Short Term: Continued assessment and intervention until BP is < 140/34mm HG in hypertensive participants. < 130/31mm HG in hypertensive participants with diabetes, heart failure or chronic kidney disease.;Long Term: Maintenance of blood pressure at goal levels.    Stress Yes    Intervention Offer individual and/or small group education and counseling on adjustment to heart disease, stress management and health-related lifestyle change. Teach and support self-help strategies.;Refer participants experiencing significant psychosocial distress to appropriate mental health specialists for further evaluation and treatment. When possible, include family members and significant others in education/counseling sessions.    Expected Outcomes Short Term: Participant demonstrates changes in health-related behavior, relaxation and other stress management skills, ability to obtain effective social support, and compliance with psychotropic medications if prescribed.;Long Term: Emotional wellbeing is indicated by absence of clinically significant psychosocial distress or social isolation.             Core Components/Risk Factors/Patient Goals Review:    Core  Components/Risk Factors/Patient Goals at Discharge (Final Review):    ITP Comments:  ITP Comments     Row Name 08/03/20 1318           ITP Comments Medical Director- Dr. Fransico Him, MD                Comments: Demetra Shiner attended orientation on 08/03/2020 to review rules and guidelines for  program.  Completed 6 minute walk test, Intitial ITP, and exercise prescription.  VSS. Telemetry-Sinus Rhythm.  Asymptomatic. Safety measures and social distancing in place per CDC guidelines. Barnet Pall, RN,BSN 08/03/2020 4:37 PM

## 2020-08-03 NOTE — Progress Notes (Signed)
Cardiac Rehab Medication Review by a Nurse  Does the patient  feel that his/her medications are working for him/her?  YES  Has the patient been experiencing any side effects to the medications prescribed?  NO  Does the patient measure his/her own blood pressure or blood glucose at home?   NO  Does the patient have any problems obtaining medications due to transportation or finances?    NO  Understanding of regimen: good Understanding of indications: good Potential of compliance: good    Nurse comments: Kevin Baxter is taking his medications as prescribed and has a good understanding of what they are for.     Christa See Yanin Muhlestein RN 08/03/2020 4:25 PM

## 2020-08-04 ENCOUNTER — Encounter (HOSPITAL_COMMUNITY): Payer: Self-pay | Admitting: *Deleted

## 2020-08-06 ENCOUNTER — Telehealth: Payer: Self-pay | Admitting: Internal Medicine

## 2020-08-06 MED ORDER — ALLOPURINOL 300 MG PO TABS: 300.0000 mg | ORAL_TABLET | Freq: Every day | ORAL | 0 refills | Status: DC

## 2020-08-06 NOTE — Telephone Encounter (Signed)
Patient requesting  allopurinol (ZYLOPRIM) 300 MG tablet [[id number]]   Patient has appt on 7/19

## 2020-08-06 NOTE — Telephone Encounter (Signed)
Refills sent

## 2020-08-09 ENCOUNTER — Other Ambulatory Visit: Payer: Self-pay

## 2020-08-09 ENCOUNTER — Encounter (HOSPITAL_COMMUNITY)
Admission: RE | Admit: 2020-08-09 | Discharge: 2020-08-09 | Disposition: A | Discharge status: Home / Self Care | Payer: 59 | Source: Ambulatory Visit | Attending: Internal Medicine | Admitting: Internal Medicine

## 2020-08-09 DIAGNOSIS — I5022 Chronic systolic (congestive) heart failure: Secondary | ICD-10-CM

## 2020-08-09 DIAGNOSIS — R9430 Abnormal result of cardiovascular function study, unspecified: Secondary | ICD-10-CM | POA: Diagnosis not present

## 2020-08-09 DIAGNOSIS — I509 Heart failure, unspecified: Secondary | ICD-10-CM | POA: Diagnosis not present

## 2020-08-09 NOTE — Progress Notes (Signed)
Daily Session Note  Patient Details  Name: Kevin Baxter MRN: [id number] Date of Birth: July 01, 1970 Referring Provider:   Flowsheet Row CARDIAC REHAB PHASE II ORIENTATION from 08/03/2020 in [place]  Referring Provider Bensimhon, [name] Pascal, MD       Encounter Date: 08/09/2020  Check In:  Session Check In - 08/09/20 1136       Check-In   Supervising physician immediately available to respond to emergencies [facility] immediately available    Physician(s) Dr. Cruzita Lederer    Location [facility]    Staff Present Lesly Rubenstein, MS, ACSM-CEP, CCRP, Exercise Physiologist;Annedrea Rosezella Florida, RN, MHA;Jetta Walker BS, ACSM EP-C, Exercise Physiologist;Olinty Celesta Aver, MS, ACSM CEP, Exercise Physiologist;Javan Gonzaga Wilber Oliphant, RN, BSN;Other    Virtual Visit No    Medication changes reported     No    Fall or balance concerns reported    No    Tobacco Cessation No Change    Warm-up and Cool-down Performed on first and last piece of equipment    Resistance Training Performed Yes    VAD Patient? No    PAD/SET Patient? No      Pain Assessment   Currently in Pain? No/denies    Pain Score 0-No pain    Multiple Pain Sites No             Capillary Blood Glucose: No results found for this or any previous visit (from the past 24 hour(s)).    Social History   Tobacco Use  Smoking Status Never  Smokeless Tobacco Never    Goals Met:  Exercise tolerated well No report of cardiac concerns or symptoms Strength training completed today  Goals Unmet:  Not Applicable  Comments: Pt started cardiac rehab today.  Pt tolerated light exercise without difficulty. VSS, telemetry-SR with no noted ectopy, asymptomatic.  Medication list reconciled. Pt denies barriers to medicaiton compliance.  PSYCHOSOCIAL ASSESSMENT:  PHQ-1. Pt completed Quality of Life survey as a participant in Cardiac Rehab.  Scores 21.0 or below are considered low.  Pt  score very low in several areas Overall 17.62, Health and Function 15.9, socioeconomic 23.14, physiological and spiritual 13.7, family 20.4 . Patient quality of life slightly altered by physical constraints which limits ability to perform as prior to recent cardiac illness. Per pt he always thought that he was healthy with no areas health wise for concern. This event has taken him by complete surprise and he admits that he feels anxious on a day to day basis because he wonders if he will have another event and will he survive. Advised pt to talk with his HF MD regarding resuming sex. Pt has supportive family which includes his wife and boys.  Offered emotional support and reassurance.  Will continue to monitor and intervene as necessary.   Pt oriented to exercise equipment and routine.  Understanding verbalized. [name] RN, BSN Cardiac and Pulmonary Rehab Nurse Navigator     Dr. Fransico Him is Medical Director for Cardiac Rehab at [facility].

## 2020-08-11 ENCOUNTER — Encounter (HOSPITAL_COMMUNITY)
Admission: RE | Admit: 2020-08-11 | Discharge: 2020-08-11 | Disposition: A | Discharge status: Home / Self Care | Payer: 59 | Source: Ambulatory Visit | Attending: Internal Medicine | Admitting: Internal Medicine

## 2020-08-11 ENCOUNTER — Other Ambulatory Visit: Payer: Self-pay

## 2020-08-11 VITALS — Wt 276.0 lb

## 2020-08-11 DIAGNOSIS — I5022 Chronic systolic (congestive) heart failure: Secondary | ICD-10-CM

## 2020-08-11 DIAGNOSIS — I509 Heart failure, unspecified: Secondary | ICD-10-CM | POA: Diagnosis not present

## 2020-08-11 NOTE — Progress Notes (Signed)
Kevin Baxter 50 y.o. male Nutrition Note  Diagnosis: CHF  Past Medical History:  Diagnosis Date   Allergy    Anxiety    Arthritis    CHF (congestive heart failure) (HCC)    Fundic gland polyposis of stomach    GERD with esophagitis    Gout    Hx of adenomatous colonic polyps    Hypertension      Medications reviewed.   Current Outpatient Medications:    allopurinol (ZYLOPRIM) 300 MG tablet, Take 1 tablet (300 mg total) by mouth daily., Disp: 15 tablet, Rfl: 0   apixaban (ELIQUIS) 5 MG TABS tablet, Take 1 tablet (5 mg total) by mouth 2 (two) times daily., Disp: 180 tablet, Rfl: 1   carvedilol (COREG) 3.125 MG tablet, Take 1 tablet (3.125 mg total) by mouth 2 (two) times daily with a meal., Disp: 60 tablet, Rfl: 6   dicyclomine (BENTYL) 20 MG tablet, Take 1 tablet (20 mg total) by mouth every 6 (six) hours as needed for spasms. (Patient not taking: Reported on 07/29/2020), Disp: 90 tablet, Rfl: 0   digoxin (LANOXIN) 0.125 MG tablet, TAKE 1 TABLET BY MOUTH DAILY, Disp: 90 tablet, Rfl: 3   empagliflozin (JARDIANCE) 10 MG TABS tablet, Take 1 tablet (10 mg total) by mouth daily., Disp: 30 tablet, Rfl: 6   ivabradine (CORLANOR) 7.5 MG TABS tablet, Take 1 tablet (7.5 mg total) by mouth 2 (two) times daily with a meal., Disp: 180 tablet, Rfl: 3   ondansetron (ZOFRAN ODT) 4 MG disintegrating tablet, Take 1 tablet (4 mg total) by mouth every 8 (eight) hours as needed for nausea or vomiting., Disp: 20 tablet, Rfl: 0   pantoprazole (PROTONIX) 40 MG tablet, Take 1 tablet (40 mg total) by mouth daily., Disp: 90 tablet, Rfl: 0   sacubitril-valsartan (ENTRESTO) 24-26 MG, Take 1 tablet by mouth 2 (two) times daily., Disp: 60 tablet, Rfl: 6   sertraline (ZOLOFT) 50 MG tablet, Take 1 tablet (50 mg total) by mouth daily., Disp: 90 tablet, Rfl: 0   spironolactone (ALDACTONE) 25 MG tablet, Take 0.5 tablets (12.5 mg total) by mouth daily., Disp: 30 tablet, Rfl: 6   sucralfate (CARAFATE) 1 g tablet,  TAKE 1 TABLET (1 G TOTAL) BY MOUTH 4 (FOUR) TIMES DAILY - WITH MEALS AND AT BEDTIME., Disp: 120 tablet, Rfl: 0   Ht Readings from Last 1 Encounters:  08/03/20 6' 0.5" (1.842 m)     Wt Readings from Last 3 Encounters:  08/03/20 276 lb 3.8 oz (125.3 kg)  06/09/20 264 lb 12.8 oz (120.1 kg)  05/19/20 262 lb 5.6 oz (119 kg)     There is no height or weight on file to calculate BMI.   Social History   Tobacco Use  Smoking Status Never  Smokeless Tobacco Never     Lab Results  Component Value Date   CHOL 188 12/03/2018   Lab Results  Component Value Date   HDL 50.10 12/03/2018   Lab Results  Component Value Date   LDLCALC 108 (H) 12/03/2018   Lab Results  Component Value Date   TRIG 60 02/27/2020     Lab Results  Component Value Date   HGBA1C 6.3 (H) 04/11/2020     CBG (last 3)  No results for input(s): GLUCAP in the last 72 hours.   Nutrition Note  Spoke with pt. Nutrition Plan and Nutrition Survey goals reviewed with pt. Pt is following a Heart Healthy diet. Pt wants to lose wt.  Pt has Pre-diabetes. Last A1c indicates blood glucose well-controlled. Reviewed diabetes prevention.  Pt with dx of CHF. Per discussion, pt does use canned/convenience foods often. Typically he checks the label to reduce sodium intake. He avoids foods with >300 mg sodium/serving. Pt does not add salt to food. Pt does eat out frequently. He is not satisfied with his diet because he has been eating out more often than he wants. He wants to start meal planning and cooking more often. He thinks this will reduce eating out for dinner. He really loves sweets. He eats them 2-3 times per week. Typically a cookout milkshake.  Diet recall Breakfast: eggs, onion, cheese, grits, and 2-3 pieces low sodium bacon Lunch: cookout grilled chicken sandwich OR sandwich at home on ww bread Dinner: salad from [employer] (with bacon, cheese, ranch etc) OR spaghetti OR chicken and rice  Pt expressed  understanding of the information reviewed.    Nutrition Diagnosis Obese   II = 35-39.9 related to excessive energy intake as evidenced by a BMI 36.92 kg/m2  Nutrition Intervention Pt's individual nutrition plan reviewed with pt. Benefits of adopting Heart Healthy diet discussed when Picture Your Plate reviewed Continue client-centered nutrition education by RD, as part of interdisciplinary care.  Goal(s) Pt to identify food quantities necessary to achieve weight loss of 6-24 lb at graduation from cardiac rehab.  Pt to build a healthy plate including vegetables, fruits, whole grains, and low-fat dairy products in a heart healthy meal plan. Pt to create a meal plan   Plan:  Will provide client-centered nutrition education as part of interdisciplinary care Monitor and evaluate progress toward nutrition goal with team.   Michaele Offer, MS, RDN, LDN

## 2020-08-12 ENCOUNTER — Encounter (HOSPITAL_COMMUNITY): Payer: Self-pay | Admitting: Psychiatry

## 2020-08-12 ENCOUNTER — Telehealth (INDEPENDENT_AMBULATORY_CARE_PROVIDER_SITE_OTHER): Payer: 59 | Admitting: Psychiatry

## 2020-08-12 DIAGNOSIS — F064 Anxiety disorder due to known physiological condition: Secondary | ICD-10-CM | POA: Diagnosis not present

## 2020-08-12 DIAGNOSIS — F4323 Adjustment disorder with mixed anxiety and depressed mood: Secondary | ICD-10-CM

## 2020-08-12 MED ORDER — SERTRALINE HCL 50 MG PO TABS: 50.0000 mg | ORAL_TABLET | Freq: Every day | ORAL | 0 refills | Status: DC

## 2020-08-12 NOTE — Progress Notes (Signed)
[facility] Follow up visit    Patient Identification: Kevin Baxter MRN:  [id number] Date of Evaluation:  08/12/2020 Referral Source: primary care Chief Complaint:   follow up anxiety Visit Diagnosis:    ICD-10-CM   1. Adjustment disorder with mixed anxiety and depressed mood  F43.23     2. 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  Location: Patient: home Provider: office   I discussed the limitations of evaluation and management by telemedicine and the availability of in person appointments. The patient expressed understanding and agreed to proceed.      I discussed the assessment and treatment plan with the patient. The patient was provided an opportunity to ask questions and all were answered. The patient agreed with the plan and demonstrated an understanding of the instructions.   The patient was advised to call back or seek an in-person evaluation if the symptoms worsen or if the condition fails to improve as anticipated.  I provided 12  minutes of non-face-to-face time during this encounter.       History of Present Illness:   Patient is a 50 years old currently married African-American male  Initially referred by primary care physician or therapist to establish care for anxiety.  He works full-time as an Administrator at [employer] but currently he is on disability because of his current medical comorbidity  Patient was diagnosed with congestive heart failure a month ago he was in the hospital during the hospital stay he developed lung clots.  Surgery was done to take care of it.  After that he has developed anxiety off fear of dying   On evaluation today he is doing much better on sertraline now at a dose of 50 mg he is having better energy  recovering from his heart condition may plan to go back to work soon   He has good support from wife and family  Denies psychotic symptoms delusions or hallucinations No manic symptoms  Denies drug use  Aggravating factor: medical co morbidity, diagnosed with [facility] and blood clots while in hospital  Modifying factor: Family  Duration since march 2022  Past Psychiatric History: denies prior to current medical condition  Previous Psychotropic Medications: Yes   Substance Abuse History in the last 12 months:  No.  Consequences of Substance Abuse: NA  Past Medical History:  Past Medical History:  Diagnosis Date   Allergy    Anxiety    Arthritis    CHF (congestive heart failure) ([place])    Fundic gland polyposis of stomach    GERD with esophagitis    Gout    Hx of adenomatous colonic polyps    Hypertension     Past Surgical History:  Procedure Laterality Date   COLONOSCOPY  05/2019   ESOPHAGOGASTRODUODENOSCOPY  05/2019   HAND SURGERY Right    2006 for a FX   IR ANGIOGRAM PULMONARY BILATERAL SELECTIVE  04/16/2020   IR ANGIOGRAM SELECTIVE EACH ADDITIONAL VESSEL  04/16/2020   IR ANGIOGRAM SELECTIVE EACH ADDITIONAL VESSEL  04/16/2020   IR THROMBECT PRIM MECH INIT (INCLU) MOD SED  04/16/2020   IR US GUIDE [place] LEFT  04/16/2020   IR US GUIDE [place] LEFT  04/16/2020   IR US  GUIDE VASC ACCESS RIGHT  04/16/2020   RADIOLOGY WITH ANESTHESIA N/A 04/16/2020   Procedure: IR WITH ANESTHESIA- PULMONARY EMBOLUS INTERVENTION;  Surgeon: Suzette Battiest, MD;  Location: [facility];  Service: Radiology;  Laterality: N/A;   RIGHT/LEFT HEART CATH AND CORONARY ANGIOGRAPHY N/A 04/15/2020   Procedure: RIGHT/LEFT HEART CATH AND CORONARY ANGIOGRAPHY;  Surgeon: Jolaine Artist, MD;  Location: [facility];  Service: Cardiovascular;  Laterality: N/A;    Family Psychiatric History: Nephew: schizophrenia  Family History:  Family History  Problem Relation Age of Onset   Cancer Mother         CERVICAL   Hypertension Father    Heart failure Father        no MI, d/t etoh   Diabetes Sister    Diabetes Sister    Diabetes Paternal Aunt    Diabetes Cousin    Colon cancer Neg Hx    Prostate cancer Neg Hx    Stroke Neg Hx    Esophageal cancer Neg Hx    Rectal cancer Neg Hx    Stomach cancer Neg Hx     Social History:   Social History   Socioeconomic History   Marital status: Married    Spouse name: Not on file   Number of children: 2   Years of education: Not on file   Highest education level: Some college, no degree  Occupational History   Occupation: works for [employer]  Tobacco Use   Smoking status: Never   Smokeless tobacco: Never  Vaping Use   Vaping Use: Never used  Substance and Sexual Activity   Alcohol use: Yes    Comment: very rare    Drug use: No   Sexual activity: Not on file  Other Topics Concern   Not on file  Social History Narrative   Household: pt, wife daughter (2013),  boy  (2006),    Social Determinants of Radio broadcast assistant Strain: Not on file  Food Insecurity: Not on file  Transportation Needs: Not on file  Physical Activity: Not on file  Stress: Not on file  Social Connections: Not on file     Allergies:   Allergies  Allergen Reactions   Pseudoephedrine     REACTION: jittery    Metabolic Disorder Labs: Lab Results  Component Value Date   HGBA1C 6.3 (H) 04/11/2020   MPG 134.11 04/11/2020   No results found for: PROLACTIN Lab Results  Component Value Date   CHOL 188 12/03/2018   TRIG 60 02/27/2020   HDL 50.10 12/03/2018   CHOLHDL 4 12/03/2018   VLDL 29.8 12/03/2018   LDLCALC 108 (H) 12/03/2018   LDLCALC 113 (H) 01/01/2017   Lab Results  Component Value Date   TSH 2.246 04/12/2020    Therapeutic Level Labs: No results found for: LITHIUM No results found for: CBMZ No results found for: VALPROATE  Current Medications: Current Outpatient Medications  Medication Sig Dispense Refill   allopurinol  (ZYLOPRIM) 300 MG tablet Take 1 tablet (300 mg total) by mouth daily. 15 tablet 0   apixaban (ELIQUIS) 5 MG TABS tablet Take 1 tablet (5 mg total) by mouth 2 (two) times daily. 180 tablet 1   carvedilol (COREG) 3.125 MG tablet Take 1 tablet (3.125 mg total) by mouth 2 (two) times daily with a meal. 60 tablet 6   dicyclomine (BENTYL) 20 MG tablet Take 1 tablet (20 mg total) by mouth every 6 (six) hours as needed for spasms. (Patient not taking:  Reported on 07/29/2020) 90 tablet 0   digoxin (LANOXIN) 0.125 MG tablet TAKE 1 TABLET BY MOUTH DAILY 90 tablet 3   empagliflozin (JARDIANCE) 10 MG TABS tablet Take 1 tablet (10 mg total) by mouth daily. 30 tablet 6   ivabradine (CORLANOR) 7.5 MG TABS tablet Take 1 tablet (7.5 mg total) by mouth 2 (two) times daily with a meal. 180 tablet 3   ondansetron (ZOFRAN ODT) 4 MG disintegrating tablet Take 1 tablet (4 mg total) by mouth every 8 (eight) hours as needed for nausea or vomiting. 20 tablet 0   pantoprazole (PROTONIX) 40 MG tablet Take 1 tablet (40 mg total) by mouth daily. 90 tablet 0   sacubitril-valsartan (ENTRESTO) 24-26 MG Take 1 tablet by mouth 2 (two) times daily. 60 tablet 6   sertraline (ZOLOFT) 50 MG tablet Take 1 tablet (50 mg total) by mouth daily. 90 tablet 0   spironolactone (ALDACTONE) 25 MG tablet Take 0.5 tablets (12.5 mg total) by mouth daily. 30 tablet 6   sucralfate (CARAFATE) 1 g tablet TAKE 1 TABLET (1 G TOTAL) BY MOUTH 4 (FOUR) TIMES DAILY - WITH MEALS AND AT BEDTIME. 120 tablet 0   No current facility-administered medications for this visit.     Psychiatric Specialty Exam: Review of Systems  Cardiovascular:  Negative for chest pain.   There were no vitals taken for this visit.There is no height or weight on file to calculate BMI.  General Appearance: Casual  Eye Contact:  Fair  Speech:  Normal Rate  Volume:  Normal  Mood:  Euthymic  Affect:  Congruent  Thought Process:  Goal Directed  Orientation:  Full (Time, Place, and  Person)  Thought Content:  Rumination  Suicidal Thoughts:  No  Homicidal Thoughts:  No  Memory:  Immediate;   Fair Recent;   Fair  Judgement:  Fair  Insight:  Fair  Psychomotor Activity:  Normal  Concentration:  Concentration: Fair and Attention Span: Fair  Recall:  Good  Fund of Knowledge:Good  Language: Fair  Akathisia:  No  Handed:  AIMS (if indicated):  not done  Assets:  Communication Skills Desire for Improvement  ADL's:  Intact  Cognition: WNL  Sleep:   variable   Screenings: PHQ2-9    Flowsheet Row CARDIAC REHAB PHASE II ORIENTATION from 08/03/2020 in [facility] Video Visit from 05/17/2020 in [facility] from 05/04/2020 in [facility] Office Visit from 03/02/2020 in [facility] at [facility] Visit from 05/22/2019 in [facility] at [facility]  PHQ-2 Total Score 1 1 2 6  0  PHQ-9 Total Score -- 6 10 23  --      Flowsheet Row Video Visit from 08/12/2020 in [facility] Video Visit from 06/14/2020 in [facility] ED from 05/19/2020 in [facility] No Risk No Risk No Risk       Assessment and Plan:  prior documentation reviewed  Adjustment disorder with depressed mood and anxiety: Improved depression continue 50 mg of sertraline  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

## 2020-08-13 ENCOUNTER — Ambulatory Visit (HOSPITAL_COMMUNITY)
Admission: RE | Admit: 2020-08-13 | Discharge: 2020-08-13 | Disposition: A | Discharge status: Home / Self Care | Payer: 59 | Source: Ambulatory Visit | Attending: Internal Medicine | Admitting: Internal Medicine

## 2020-08-13 ENCOUNTER — Telehealth (HOSPITAL_COMMUNITY): Payer: Self-pay | Admitting: Internal Medicine

## 2020-08-13 ENCOUNTER — Encounter (HOSPITAL_COMMUNITY): Payer: 59

## 2020-08-13 ENCOUNTER — Other Ambulatory Visit: Payer: Self-pay

## 2020-08-13 VITALS — BP 146/88 | HR 81 | Ht 72.5 in | Wt 274.2 lb

## 2020-08-13 DIAGNOSIS — I509 Heart failure, unspecified: Secondary | ICD-10-CM

## 2020-08-13 DIAGNOSIS — I2609 Other pulmonary embolism with acute cor pulmonale: Secondary | ICD-10-CM | POA: Diagnosis not present

## 2020-08-13 DIAGNOSIS — Z8249 Family history of ischemic heart disease and other diseases of the circulatory system: Secondary | ICD-10-CM | POA: Insufficient documentation

## 2020-08-13 DIAGNOSIS — I110 Hypertensive heart disease with heart failure: Secondary | ICD-10-CM | POA: Diagnosis present

## 2020-08-13 DIAGNOSIS — I513 Intracardiac thrombosis, not elsewhere classified: Secondary | ICD-10-CM | POA: Diagnosis not present

## 2020-08-13 DIAGNOSIS — R570 Cardiogenic shock: Secondary | ICD-10-CM | POA: Diagnosis not present

## 2020-08-13 DIAGNOSIS — Z7901 Long term (current) use of anticoagulants: Secondary | ICD-10-CM | POA: Diagnosis not present

## 2020-08-13 DIAGNOSIS — I10 Essential (primary) hypertension: Secondary | ICD-10-CM

## 2020-08-13 DIAGNOSIS — I5022 Chronic systolic (congestive) heart failure: Secondary | ICD-10-CM | POA: Diagnosis not present

## 2020-08-13 DIAGNOSIS — Z79899 Other long term (current) drug therapy: Secondary | ICD-10-CM | POA: Insufficient documentation

## 2020-08-13 DIAGNOSIS — I82411 Acute embolism and thrombosis of right femoral vein: Secondary | ICD-10-CM | POA: Insufficient documentation

## 2020-08-13 DIAGNOSIS — I5082 Biventricular heart failure: Secondary | ICD-10-CM | POA: Insufficient documentation

## 2020-08-13 LAB — BASIC METABOLIC PANEL
Anion gap: 8 (ref 5–15)
BUN: 15 mg/dL (ref 6–20)
CO2: 29 mmol/L (ref 22–32)
Calcium: 9.7 mg/dL (ref 8.9–10.3)
Chloride: 100 mmol/L (ref 98–111)
Creatinine, Ser: 1.21 mg/dL (ref 0.61–1.24)
GFR, Estimated: >60 mL/min (ref >60)
Glucose, Bld: 91 mg/dL (ref 70–99)
Potassium: 4.6 mmol/L (ref 3.5–5.1)
Sodium: 137 mmol/L (ref 135–145)

## 2020-08-13 LAB — CBC
HCT: 52.6 % — ABNORMAL HIGH (ref 39.0–52.0)
Hemoglobin: 16 g/dL (ref 13.0–17.0)
MCH: 26 pg (ref 26.0–34.0)
MCHC: 30.4 g/dL (ref 30.0–36.0)
MCV: 85.5 fL (ref 80.0–100.0)
Platelets: 216 10*3/uL (ref 150–400)
RBC: 6.15 MIL/uL — ABNORMAL HIGH (ref 4.22–5.81)
RDW: 17.7 % — ABNORMAL HIGH (ref 11.5–15.5)
WBC: 4.9 10*3/uL (ref 4.0–10.5)
nRBC: 0 % (ref 0.0–0.2)

## 2020-08-13 MED ORDER — IVABRADINE HCL 5 MG PO TABS: 5.0000 mg | ORAL_TABLET | Freq: Two times a day (BID) | ORAL | 1 refills | Status: DC

## 2020-08-13 NOTE — Addendum Note (Signed)
Encounter addended by: Stanford Scotland, RN on: 08/13/2020 11:18 AM  Actions taken: Letter saved

## 2020-08-13 NOTE — Patient Instructions (Addendum)
STOP Digoxin  DECREASE Ivabradine to 5 mg Twice daily   Labs done today, your results will be available in MyChart, we will contact you for abnormal readings.  Your physician has requested that you have an echocardiogram. Echocardiography is a painless test that uses sound waves to create images of your heart. It provides your doctor with information about the size and shape of your heart and how well your heart's chambers and valves are working. This procedure takes approximately one hour. There are no restrictions for this procedure.   Your physician recommends that you schedule a follow-up appointment in: 3 months with echocardiogram  If you have any questions or concerns before your next appointment please send [place] a message through [facility] or call our office at [phone].    TO LEAVE A MESSAGE FOR THE NURSE SELECT OPTION 2, PLEASE LEAVE A MESSAGE INCLUDING: YOUR NAME DATE OF BIRTH CALL BACK NUMBER REASON FOR CALL**this is important as we prioritize the call backs  YOU WILL RECEIVE A CALL BACK THE SAME DAY AS LONG AS YOU CALL BEFORE 4:00 PM  milAt the [facility], you and your health needs are our priority. As part of our continuing mission to provide you with exceptional heart care, we have created designated Provider Care Teams. These Care Teams include your primary Cardiologist (physician) and Advanced Practice Providers (APPs- Physician Assistants and Nurse Practitioners) who all work together to provide you with the care you need, when you need it.   You may see any of the following providers on your designated Care Team at your next follow up: Dr Glori Bickers Dr Loralie Champagne Dr Patrice Paradise, NP Lyda Jester, [place] Ginnie Smart Audry Riles, PharmD   Please be sure to bring in all your medications bottles to every appointment.

## 2020-08-13 NOTE — Progress Notes (Signed)
ADVANCED HF CLINIC VISIT  PCP: Colon Branch, MD Primary Cardiologists: Fransico Him, Winthrop Shannahan  HPI:  Kevin Baxter is a 50 y.o. male with HTN, morbid obesity, pancreatitis, DVT with submassive PE and systolic HF due to NICM.  Admitted in 3/22 for intractable n/v. Initially felt to have acute cholecystitis. Echo showed EF 15% with mod RV dysfunction. + large LV clot. Started on inotropes. Cath showed normal coronaries. cMRI LVEF 12% RVEF 21%. No significant LGE.  Post -cath developed worsening CP and tachycardia. CT showed bilateral submassive PE with pulmonary infarct. Underwent catheter-based thrombectomy of PE. Milrinone slowly weaned off. Discharged 04/24/20.    Last visit Feeling much stronger. Now walking 5K steps per day. Just finished PT and about to enroll in CR. Denies edema, orthopnea or PND. Taking torsemide about 1x/week  No bleeding with Eliquis.  BP 110-115/70s  Here for FU says he has been feeling great.  Has had continued improvement in exercise capacity, walking about 30 minutes per day now. No longer needing torsemide. No bleeding with eliquis.  BP at home usually 117-120/70.  HR has been good 68 resting by fitbit.    Echo 06/09/20 EF 25-30% RV mild HK ? Small laminated apical clot Personally reviewed  ROS: All systems negative except as listed in HPI, PMH and Problem List.  SH:  Social History   Socioeconomic History   Marital status: Married    Spouse name: Not on file   Number of children: 2   Years of education: Not on file   Highest education level: Some college, no degree  Occupational History   Occupation: works for [employer]  Tobacco Use   Smoking status: Never   Smokeless tobacco: Never  Vaping Use   Vaping Use: Never used  Substance and Sexual Activity   Alcohol use: Yes    Comment: very rare    Drug use: No   Sexual activity: Not on file  Other Topics Concern   Not on file  Social History Narrative    Household: pt, wife daughter (2013),  boy  (2006),    Social Determinants of Radio broadcast assistant Strain: Not on file  Food Insecurity: Not on file  Transportation Needs: Not on file  Physical Activity: Not on file  Stress: Not on file  Social Connections: Not on file  Intimate Partner Violence: Not on file    FH:  Family History  Problem Relation Age of Onset   Cancer Mother        CERVICAL   Hypertension Father    Heart failure Father        no MI, d/t etoh   Diabetes Sister    Diabetes Sister    Diabetes Paternal Aunt    Diabetes Cousin    Colon cancer Neg Hx    Prostate cancer Neg Hx    Stroke Neg Hx    Esophageal cancer Neg Hx    Rectal cancer Neg Hx    Stomach cancer Neg Hx     Past Medical History:  Diagnosis Date   Allergy    Anxiety    Arthritis    CHF (congestive heart failure) (HCC)    Fundic gland polyposis of stomach    GERD with esophagitis    Gout    Hx  of adenomatous colonic polyps    Hypertension     Current Outpatient Medications  Medication Sig Dispense Refill   allopurinol (ZYLOPRIM) 300 MG tablet Take 1 tablet (300 mg total) by mouth daily. 15 tablet 0   apixaban (ELIQUIS) 5 MG TABS tablet Take 1 tablet (5 mg total) by mouth 2 (two) times daily. 180 tablet 1   carvedilol (COREG) 3.125 MG tablet Take 1 tablet (3.125 mg total) by mouth 2 (two) times daily with a meal. 60 tablet 6   digoxin (LANOXIN) 0.125 MG tablet TAKE 1 TABLET BY MOUTH DAILY 90 tablet 3   empagliflozin (JARDIANCE) 10 MG TABS tablet Take 1 tablet (10 mg total) by mouth daily. 30 tablet 6   ivabradine (CORLANOR) 7.5 MG TABS tablet Take 1 tablet (7.5 mg total) by mouth 2 (two) times daily with a meal. 180 tablet 3   ondansetron (ZOFRAN ODT) 4 MG disintegrating tablet Take 1 tablet (4 mg total) by mouth every 8 (eight) hours as needed for nausea or vomiting. 20 tablet 0   pantoprazole (PROTONIX) 40 MG tablet Take 1 tablet (40 mg total) by mouth daily. 90 tablet 0    sacubitril-valsartan (ENTRESTO) 24-26 MG Take 1 tablet by mouth 2 (two) times daily. 60 tablet 6   sertraline (ZOLOFT) 50 MG tablet Take 1 tablet (50 mg total) by mouth daily. 90 tablet 0   spironolactone (ALDACTONE) 25 MG tablet Take 0.5 tablets (12.5 mg total) by mouth daily. 30 tablet 6   sucralfate (CARAFATE) 1 g tablet TAKE 1 TABLET (1 G TOTAL) BY MOUTH 4 (FOUR) TIMES DAILY - WITH MEALS AND AT BEDTIME. 120 tablet 0   No current facility-administered medications for this encounter.    Vitals:   08/13/20 0943  BP: (!) 146/88  Pulse: 81  SpO2: 99%  Weight: 124.4 kg (274 lb 3.2 oz)  Height: 6' 0.5" (1.842 m)     PHYSICAL EXAM:  General:  Well appearing. No resp difficulty HEENT: normal Neck: supple. no JVD. Carotids 2+ bilat; no bruits. No lymphadenopathy or thryomegaly appreciated. Cor: PMI nondisplaced. Regular rate & rhythm. No rubs, gallops or murmurs. Lungs: clear Abdomen: soft, nontender, nondistended. No hepatosplenomegaly. No bruits or masses. Good bowel sounds. Extremities: no cyanosis, clubbing, rash, edema Neuro: alert & orientedx3, cranial nerves grossly intact. moves all 4 extremities w/o difficulty. Affect pleasant    ASSESSMENT & PLAN:    1. Acute systolic biventricular CHF with cardiogenic shock: - ECHO 3/22  LVEF < 20% with large LV thrombus,mild to moderate RV dysfunction - Cardiac catheterization 04/16/20 with normal coronaries - cMRI LVEF 12% RVEF 21%. No significant LGE - also complicated by RV failure from PE - Echo  06/09/20 EF 25-30% RV mild HK ? Small laminated apical clot  - Continues to improve. NYHA I - Volume status ok - Stop digoxin - Continue spironolactone 12.5 - Continue entresto 24/26 BID - Cut ivabradine to 5 bid - Continue jardiance 10,  torsemide prn - Continue carvedilol 3.125 bid - labs today  2. LV thrombus: - Much improved on most recent echo 05/2020 - Continue Eliquis. No bleeding    3. Acute submassive PE with RV strain  and bilateral pulmonary infarcts. Also R femoral DVT - Chest CT 3/22 bilateral submassive PE with RV strain and RUL infarct - s/p Penumbra clot extraction 04/16/20 - Continue Eliquis. No bleeding   Glori Bickers, MD  10:39 AM  Patient seen and examined with the above-signed Advanced Practice Provider and/or Housestaff. I personally  reviewed laboratory data, imaging studies and relevant notes. I independently examined the patient and formulated the important aspects of the plan. I have edited the note to reflect any of my changes or salient points. I have personally discussed the plan with the patient and/or family.  Doing great. NYHA I. Volume status ok. SBP 105-120 at home and at CR.   General:  Well appearing. No resp difficulty HEENT: normal Neck: supple. no JVD. Carotids 2+ bilat; no bruits. No lymphadenopathy or thryomegaly appreciated. Cor: PMI nondisplaced. Regular rate & rhythm. No rubs, gallops or murmurs. Lungs: clear Abdomen: soft, nontender, nondistended. No hepatosplenomegaly. No bruits or masses. Good bowel sounds. Extremities: no cyanosis, clubbing, rash, edema Neuro: alert & orientedx3, cranial nerves grossly intact. moves all 4 extremities w/o difficulty. Affect pleasant  Doing very well.  NYHA I. Stop digoxin. Cut ivabradine to 5 bid. Plan labs and bedside echo today. Will need lifelong AC. (Can cut to half dose Eliquis in 6 months)   Glori Bickers, MD  10:51 AM

## 2020-08-16 ENCOUNTER — Encounter (HOSPITAL_COMMUNITY)
Admission: RE | Admit: 2020-08-16 | Discharge: 2020-08-16 | Disposition: A | Discharge status: Home / Self Care | Payer: 59 | Source: Ambulatory Visit | Attending: Internal Medicine | Admitting: Internal Medicine

## 2020-08-16 ENCOUNTER — Other Ambulatory Visit: Payer: Self-pay

## 2020-08-16 DIAGNOSIS — I5022 Chronic systolic (congestive) heart failure: Secondary | ICD-10-CM

## 2020-08-16 DIAGNOSIS — I509 Heart failure, unspecified: Secondary | ICD-10-CM | POA: Diagnosis not present

## 2020-08-17 ENCOUNTER — Encounter: Payer: Self-pay | Admitting: Internal Medicine

## 2020-08-17 ENCOUNTER — Ambulatory Visit (INDEPENDENT_AMBULATORY_CARE_PROVIDER_SITE_OTHER): Payer: 59 | Admitting: Internal Medicine

## 2020-08-17 VITALS — BP 139/85 | HR 87 | Temp 98.5°F | Resp 18 | Ht 73.0 in | Wt 272.0 lb

## 2020-08-17 DIAGNOSIS — M109 Gout, unspecified: Secondary | ICD-10-CM

## 2020-08-17 DIAGNOSIS — R739 Hyperglycemia, unspecified: Secondary | ICD-10-CM | POA: Diagnosis not present

## 2020-08-17 DIAGNOSIS — I5022 Chronic systolic (congestive) heart failure: Secondary | ICD-10-CM | POA: Diagnosis not present

## 2020-08-17 LAB — LIPID PANEL
Cholesterol: 212 mg/dL — ABNORMAL HIGH (ref 0–200)
HDL: 63.3 mg/dL (ref >39.00)
LDL Cholesterol: 124 mg/dL — ABNORMAL HIGH (ref 0–99)
NonHDL: 149.07
Total CHOL/HDL Ratio: 3
Triglycerides: 123 mg/dL (ref 0.0–149.0)
VLDL: 24.6 mg/dL (ref 0.0–40.0)

## 2020-08-17 LAB — HEMOGLOBIN A1C: Hgb A1c MFr Bld: 5.6 % (ref 4.6–6.5)

## 2020-08-17 LAB — URIC ACID: Uric Acid, Serum: 6.7 mg/dL (ref 4.0–7.8)

## 2020-08-17 MED ORDER — ALLOPURINOL 300 MG PO TABS: 300.0000 mg | ORAL_TABLET | Freq: Every day | ORAL | 3 refills | Status: DC

## 2020-08-17 NOTE — Progress Notes (Signed)
Subjective:    Patient ID: Kevin Baxter, male    DOB: 16-Jul-1970, 50 y.o.   MRN: [id number]  DOS:  08/17/2020 Type of visit - description: ROV  Since the last visit, was admitted to the hospital 03-2020.  Diagnosed with CHF PE. Chart reviewed.  He also had dysphagia, seen by GI, chart reviewed.  He is here for a checkup & gout management. Denies any gout symptoms lately.  Anxiety: Under the care of behavioral.  Last visit 08/12/2020, noted to be improving  Wt Readings from Last 3 Encounters:  08/17/20 272 lb (123.4 kg)  08/13/20 274 lb 3.2 oz (124.4 kg)  08/11/20 276 lb (125.2 kg)     Review of Systems Denies chest pain no difficulty breathing No nausea, vomiting, diarrhea.   Past Medical History:  Diagnosis Date   Allergy    Anxiety    Arthritis    CHF (congestive heart failure) (HCC)    Fundic gland polyposis of stomach    GERD with esophagitis    Gout    Hx of adenomatous colonic polyps    Hypertension     Past Surgical History:  Procedure Laterality Date   COLONOSCOPY  05/2019   ESOPHAGOGASTRODUODENOSCOPY  05/2019   HAND SURGERY Right    2006 for a FX   IR ANGIOGRAM PULMONARY BILATERAL SELECTIVE  04/16/2020   IR ANGIOGRAM SELECTIVE EACH ADDITIONAL VESSEL  04/16/2020   IR ANGIOGRAM SELECTIVE EACH ADDITIONAL VESSEL  04/16/2020   IR THROMBECT PRIM MECH INIT (INCLU) MOD SED  04/16/2020   IR US GUIDE VASC ACCESS LEFT  04/16/2020   IR US GUIDE VASC ACCESS LEFT  04/16/2020   IR US GUIDE VASC ACCESS RIGHT  04/16/2020   RADIOLOGY WITH ANESTHESIA N/A 04/16/2020   Procedure: IR WITH ANESTHESIA- PULMONARY EMBOLUS INTERVENTION;  Surgeon: Suzette Battiest, MD;  Location: [facility];  Service: Radiology;  Laterality: N/A;   RIGHT/LEFT HEART CATH AND CORONARY ANGIOGRAPHY N/A 04/15/2020   Procedure: RIGHT/LEFT HEART CATH AND CORONARY ANGIOGRAPHY;  Surgeon: Jolaine Artist, MD;  Location: [facility];  Service: Cardiovascular;  Laterality: N/A;    Allergies as of  08/17/2020       Reactions   Pseudoephedrine    REACTION: jittery        Medication List        Accurate as of August 17, 2020  9:20 PM. If you have any questions, ask your nurse or doctor.          STOP taking these medications    digoxin 0.125 MG tablet Commonly known as: LANOXIN Stopped by: Kathlene November, MD       TAKE these medications    allopurinol 300 MG tablet Commonly known as: ZYLOPRIM Take 1 tablet (300 mg total) by mouth daily.   carvedilol 3.125 MG tablet Commonly known as: Coreg Take 1 tablet (3.125 mg total) by mouth 2 (two) times daily with a meal.   Eliquis 5 MG Tabs tablet Generic drug: apixaban Take 1 tablet (5 mg total) by mouth 2 (two) times daily.   empagliflozin 10 MG Tabs tablet Commonly known as: JARDIANCE Take 1 tablet (10 mg total) by mouth daily.   ivabradine 5 MG Tabs tablet Commonly known as: CORLANOR Take 1 tablet (5 mg total) by mouth 2 (two) times daily with a meal.   ondansetron 4 MG disintegrating tablet Commonly known as: Zofran ODT Take 1 tablet (4 mg total) by mouth every 8 (eight) hours as needed  for nausea or vomiting.   pantoprazole 40 MG tablet Commonly known as: PROTONIX Take 1 tablet (40 mg total) by mouth daily.   sacubitril-valsartan 24-26 MG Commonly known as: ENTRESTO Take 1 tablet by mouth 2 (two) times daily.   sertraline 50 MG tablet Commonly known as: ZOLOFT Take 1 tablet (50 mg total) by mouth daily.   spironolactone 25 MG tablet Commonly known as: ALDACTONE Take 0.5 tablets (12.5 mg total) by mouth daily.   sucralfate 1 g tablet Commonly known as: CARAFATE TAKE 1 TABLET (1 G TOTAL) BY MOUTH 4 (FOUR) TIMES DAILY - WITH MEALS AND AT BEDTIME.           Objective:   Physical Exam BP 139/85 (BP Location: Left Arm, Patient Position: Sitting, Cuff Size: Large)   Pulse 87   Temp 98.5 F (36.9 C) (Oral)   Resp 18   Ht 6\' 1"  (1.854 m)   Wt 272 lb (123.4 kg)   SpO2 99%   BMI 35.89 kg/m   General:   Well developed, NAD, BMI noted. HEENT:  Normocephalic . Face symmetric, atraumatic Lungs:  CTA B Normal respiratory effort, no intercostal retractions, no accessory muscle use. Heart: RRR,  no murmur.  Lower extremities: no pretibial edema bilaterally  Skin: Not pale. Not jaundice Neurologic:  alert & oriented X3.  Speech normal, gait appropriate for age and unassisted Psych--  Cognition and judgment appear intact.  Cooperative with normal attention span and concentration.  Behavior appropriate. No anxious or depressed appearing.      Assessment     Assessment: Prediabetes since 2004, a1c 6.2  HTN Gout Acute biventricular CHF with cardiogenic shock Dx 03-2020 Submassive pulmonary embolism Dx 03-2020, rx life long anticoag per cards note, 1/2 dose after 09-2020 Morbid obesity  ED Asthma as a child  PLAN: Acute systolic biventricular CHF with cardiogenic shock 03/2020 Since the last visit, went to the hospital 3-22, eventually had a cardiac catheterization that showed decreased EF, normal coronaries. 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 Had a thrombectomy. Last visit with cardiology 08/13/2020, felt to be stable, on Eliquis. 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 Anxiety, depression: Now under the care of psychiatry for anxiety and adjustment disorder, last visit 08/12/2020.  Felt to be improving.  The patient today states he is doing great. Prediabetes: Check A1c Morbid obesity:~ a year ago his weight was 328 pounds, today is 272 pounds.  He is doing great with diet and exercise, praised! Gout: RF allopurinol, no symptoms, check a uric acid. Preventive care: Encouraged to proceed with a COVID-vaccine, encouraged a flu shot yearly. RTC CPX 6  months.    This visit occurred during the SARS-CoV-2 public health emergency.  Safety protocols were in place, including screening questions prior to the visit, additional usage of staff PPE, and extensive cleaning of exam room while observing appropriate contact time as indicated for disinfecting solutions.

## 2020-08-17 NOTE — Progress Notes (Signed)
Cardiac Individual Treatment Plan  Patient Details  Name: Kevin Baxter MRN: [id number] Date of Birth: 28-Jul-1970 Referring Provider:   Flowsheet Row CARDIAC REHAB PHASE II ORIENTATION from 08/03/2020 in [facility]  Referring Provider Bensimhon, [name] Pascal, MD       Initial Encounter Date:  [facility] PHASE II ORIENTATION from 08/03/2020 in [facility]  Date 08/03/20       Visit Diagnosis: 04/11/20 CHF  Patient's Home Medications on Admission:  Current Outpatient Medications:    allopurinol (ZYLOPRIM) 300 MG tablet, Take 1 tablet (300 mg total) by mouth daily., Disp: 90 tablet, Rfl: 3   apixaban (ELIQUIS) 5 MG TABS tablet, Take 1 tablet (5 mg total) by mouth 2 (two) times daily., Disp: 180 tablet, Rfl: 1   carvedilol (COREG) 3.125 MG tablet, Take 1 tablet (3.125 mg total) by mouth 2 (two) times daily with a meal., Disp: 60 tablet, Rfl: 6   empagliflozin (JARDIANCE) 10 MG TABS tablet, Take 1 tablet (10 mg total) by mouth daily., Disp: 30 tablet, Rfl: 6   ivabradine (CORLANOR) 5 MG TABS tablet, Take 1 tablet (5 mg total) by mouth 2 (two) times daily with a meal., Disp: 180 tablet, Rfl: 1   ondansetron (ZOFRAN ODT) 4 MG disintegrating tablet, Take 1 tablet (4 mg total) by mouth every 8 (eight) hours as needed for nausea or vomiting., Disp: 20 tablet, Rfl: 0   pantoprazole (PROTONIX) 40 MG tablet, Take 1 tablet (40 mg total) by mouth daily., Disp: 90 tablet, Rfl: 0   sacubitril-valsartan (ENTRESTO) 24-26 MG, Take 1 tablet by mouth 2 (two) times daily., Disp: 60 tablet, Rfl: 6   sertraline (ZOLOFT) 50 MG tablet, Take 1 tablet (50 mg total) by mouth daily., Disp: 90 tablet, Rfl: 0   spironolactone (ALDACTONE) 25 MG tablet, Take 0.5 tablets (12.5 mg total) by mouth daily., Disp: 30 tablet, Rfl: 6   sucralfate (CARAFATE) 1 g tablet, TAKE 1 TABLET (1 G TOTAL) BY MOUTH 4 (FOUR) TIMES DAILY - WITH MEALS AND AT  BEDTIME., Disp: 120 tablet, Rfl: 0  Past Medical History: Past Medical History:  Diagnosis Date   Allergy    Anxiety    Arthritis    CHF (congestive heart failure) (HCC)    Fundic gland polyposis of stomach    GERD with esophagitis    Gout    Hx of adenomatous colonic polyps    Hypertension     Tobacco Use: Social History   Tobacco Use  Smoking Status Never  Smokeless Tobacco Never    Labs: Recent Review Flowsheet Data     Labs for ITP Cardiac and Pulmonary Rehab Latest Ref Rng & Units 04/22/2020 04/23/2020 04/24/2020 04/24/2020 08/17/2020   Cholestrol 0 - 200 mg/dL - - - - 212(H)   LDLCALC 0 - 99 mg/dL - - - - 124(H)   HDL >39.00 mg/dL - - - - 63.30   Trlycerides 0.0 - 149.0 mg/dL - - - - 123.0   Hemoglobin A1c 4.6 - 6.5 % - - - - 5.6   PHART 7.350 - 7.450 - - - - -   PCO2ART 32.0 - 48.0 mmHg - - - - -   HCO3 20.0 - 28.0 mmol/L - - - - -   TCO2 22 - 32 mmol/L - - - - -   O2SAT % 56.7 62.1 50.5 56.2 -       Capillary Blood Glucose: No results found  for: GLUCAP   Exercise Target Goals: Exercise Program Goal: Individual exercise prescription set using results from initial 6 min walk test and THRR while considering  patient's activity barriers and safety.   Exercise Prescription Goal: Initial exercise prescription builds to 30-45 minutes a day of aerobic activity, 2-3 days per week.  Home exercise guidelines will be given to patient during program as part of exercise prescription that the participant will acknowledge.  Activity Barriers & Risk Stratification:  Activity Barriers & Cardiac Risk Stratification - 08/03/20 1330       Activity Barriers & Cardiac Risk Stratification   Activity Barriers None    Cardiac Risk Stratification High             6 Minute Walk:  6 Minute Walk     Row Name 08/03/20 1342         6 Minute Walk   Phase Initial     Distance 1684 feet     Walk Time 6 minutes     # of Rest Breaks 0     MPH 3.19     METS 4.24     RPE  9     Perceived Dyspnea  0     VO2 Peak 14.84     Symptoms No     Resting HR 93 bpm     Resting BP 124/78     Resting Oxygen Saturation  98 %     Exercise Oxygen Saturation  during 6 min walk 98 %     Max Ex. HR 110 bpm     Max Ex. BP 142/82     2 Minute Post BP 128/82              Oxygen Initial Assessment:   Oxygen Re-Evaluation:   Oxygen Discharge (Final Oxygen Re-Evaluation):   Initial Exercise Prescription:  Initial Exercise Prescription - 08/03/20 1400       Date of Initial Exercise RX and Referring Provider   Date 08/03/20    Referring Provider Bensimhon, [name] Pascal, MD    Expected Discharge Date 10/01/20      Treadmill   MPH 2.8    Grade 0    Minutes 15    METs 3.14      T5 Nustep   Level 3    SPM 85    Minutes 15    METs 3      Prescription Details   Frequency (times per week) 3    Duration Progress to 30 minutes of continuous aerobic without signs/symptoms of physical distress      Intensity   THRR 40-80% of Max Heartrate 68-135    Ratings of Perceived Exertion 11-13    Perceived Dyspnea 0-4      Progression   Progression Continue to progress workloads to maintain intensity without signs/symptoms of physical distress.      Resistance Training   Training Prescription Yes    Weight 5 lbs    Reps 10-15             Perform Capillary Blood Glucose checks as needed.  Exercise Prescription Changes:   Exercise Prescription Changes     Row Name 08/09/20 1119 08/16/20 1059           Response to Exercise   Blood Pressure (Admit) 110/80 134/90      Blood Pressure (Exercise) 124/70 160/84      Blood Pressure (Exit) 116/72 128/86      Heart Rate (Admit) 99 bpm 98  bpm      Heart Rate (Exercise) 122 bpm 115 bpm      Heart Rate (Exit) 102 bpm 98 bpm      Rating of Perceived Exertion (Exercise) 10 12      Symptoms none none      Comments Off to a good start with exercise. Blood pressure mildly elevated at rest and with exercise.       Duration Continue with 30 min of aerobic exercise without signs/symptoms of physical distress. Continue with 30 min of aerobic exercise without signs/symptoms of physical distress.      Intensity THRR unchanged THRR unchanged             Progression      Progression Continue to progress workloads to maintain intensity without signs/symptoms of physical distress. Continue to progress workloads to maintain intensity without signs/symptoms of physical distress.      Average METs 2.6 2.9             Resistance Training      Training Prescription Yes Yes      Weight 5 lbs 5 lbs      Reps 10-15 10-15      Time 10 Minutes 10 Minutes             Interval Training      Interval Training No No             Treadmill      MPH 2.8 3      Grade 0 1      Minutes 15 15      METs 3.14 3.71             T5 Nustep      Level 3 3      SPM 85 85      Minutes 15 15      METs 2 2.2              Exercise Comments:   Exercise Comments     Row Name 08/09/20 1119 08/16/20 1114         Exercise Comments Patient tolerated low intensity exercise well without symptoms. Oriented to hand weight exercises. Reviewed METs and goals with patient. Increased speed and incline on treadmill and level on recumbent stepper.               Exercise Goals and Review:   Exercise Goals     Row Name 08/03/20 1340             Exercise Goals   Increase Physical Activity Yes       Intervention Provide advice, education, support and counseling about physical activity/exercise needs.;Develop an individualized exercise prescription for aerobic and resistive training based on initial evaluation findings, risk stratification, comorbidities and participant's personal goals.       Expected Outcomes Short Term: Attend rehab on a regular basis to increase amount of physical activity.;Long Term: Exercising regularly at least 3-5 days a week.;Long Term: Add in home exercise to make exercise part of routine and to  increase amount of physical activity.       Increase Strength and Stamina Yes       Intervention Provide advice, education, support and counseling about physical activity/exercise needs.;Develop an individualized exercise prescription for aerobic and resistive training based on initial evaluation findings, risk stratification, comorbidities and participant's personal goals.       Expected Outcomes Short Term: Increase workloads from initial exercise prescription for resistance, speed,  and METs.;Short Term: Perform resistance training exercises routinely during rehab and add in resistance training at home;Long Term: Improve cardiorespiratory fitness, muscular endurance and strength as measured by increased METs and functional capacity (6MWT)       Able to understand and use rate of perceived exertion (RPE) scale Yes       Intervention Provide education and explanation on how to use RPE scale       Expected Outcomes Short Term: Able to use RPE daily in rehab to express subjective intensity level;Long Term:  Able to use RPE to guide intensity level when exercising independently       Knowledge and understanding of Target Heart Rate Range (THRR) Yes       Intervention Provide education and explanation of THRR including how the numbers were predicted and where they are located for reference       Expected Outcomes Short Term: Able to state/look up THRR;Long Term: Able to use THRR to govern intensity when exercising independently;Short Term: Able to use daily as guideline for intensity in rehab       Able to check pulse independently Yes       Intervention Provide education and demonstration on how to check pulse in carotid and radial arteries.;Review the importance of being able to check your own pulse for safety during independent exercise       Expected Outcomes Short Term: Able to explain why pulse checking is important during independent exercise;Long Term: Able to check pulse independently and accurately        Understanding of Exercise Prescription Yes       Intervention Provide education, explanation, and written materials on patient's individual exercise prescription       Expected Outcomes Short Term: Able to explain program exercise prescription;Long Term: Able to explain home exercise prescription to exercise independently                Exercise Goals Re-Evaluation :  Exercise Goals Re-Evaluation     Row Name 08/09/20 1119 08/16/20 1114           Exercise Goal Re-Evaluation   Exercise Goals Review Increase Physical Activity;Able to understand and use rate of perceived exertion (RPE) scale Increase Physical Activity;Able to understand and use rate of perceived exertion (RPE) scale      Comments Patient able to understand and use RPE scale appropriately. Reviewed THRR, METs, and RPE scale with patient. Patient is walking at home in addition to exercise at cardiac rehab. Increased wrokloads today.      Expected Outcomes Progress workloads as tolerated to help achieve personal health and fitness goals. Continue to progress workloads as tolerated to help increase cardiorespiratory fitness.               Discharge Exercise Prescription (Final Exercise Prescription Changes):  Exercise Prescription Changes - 08/16/20 1059       Response to Exercise   Blood Pressure (Admit) 134/90    Blood Pressure (Exercise) 160/84    Blood Pressure (Exit) 128/86    Heart Rate (Admit) 98 bpm    Heart Rate (Exercise) 115 bpm    Heart Rate (Exit) 98 bpm    Rating of Perceived Exertion (Exercise) 12    Symptoms none    Comments Blood pressure mildly elevated at rest and with exercise.    Duration Continue with 30 min of aerobic exercise without signs/symptoms of physical distress.    Intensity THRR unchanged      Progression   Progression  Continue to progress workloads to maintain intensity without signs/symptoms of physical distress.    Average METs 2.9      Resistance Training    Training Prescription Yes    Weight 5 lbs    Reps 10-15    Time 10 Minutes      Interval Training   Interval Training No      Treadmill   MPH 3    Grade 1    Minutes 15    METs 3.71      T5 Nustep   Level 3    SPM 85    Minutes 15    METs 2.2             Nutrition:  Target Goals: Understanding of nutrition guidelines, daily intake of sodium 1500mg , cholesterol 200mg , calories 30% from fat and 7% or less from saturated fats, daily to have 5 or more servings of fruits and vegetables.  Biometrics:  Pre Biometrics - 08/03/20 1318       Pre Biometrics   Waist Circumference 49 inches    Hip Circumference 51 inches    Waist to Hip Ratio 0.96 %    Triceps Skinfold 31 mm    % Body Fat 37.1 %    Grip Strength 52.5 kg    Flexibility 14.63 in    Single Leg Stand 30 seconds              Nutrition Therapy Plan and Nutrition Goals:  Nutrition Therapy & Goals - 08/11/20 1458       Nutrition Therapy   Diet TLC      Personal Nutrition Goals   Nutrition Goal Pt to identify food quantities necessary to achieve weight loss of 6-24 lb at graduation from cardiac rehab.    Personal Goal #2 Pt to build a healthy plate including vegetables, fruits, whole grains, and low-fat dairy products in a heart healthy meal plan.    Personal Goal #3 Pt to create a meal plan      Intervention Plan   Intervention Prescribe, educate and counsel regarding individualized specific dietary modifications aiming towards targeted core components such as weight, hypertension, lipid management, diabetes, heart failure and other comorbidities.;Nutrition handout(s) given to patient.    Expected Outcomes Long Term Goal: Adherence to prescribed nutrition plan.;Short Term Goal: A plan has been developed with personal nutrition goals set during dietitian appointment.             Nutrition Assessments:  MEDIFICTS Score Key: ?70 Need to make dietary changes  40-70 Heart Healthy Diet ? 40  Therapeutic Level Cholesterol Diet   Flowsheet Row CARDIAC REHAB PHASE II EXERCISE from 08/11/2020 in [facility]  Picture Your Plate Total Score on Admission 55      Picture Your Plate Scores: <35 Unhealthy dietary pattern with much room for improvement. 41-50 Dietary pattern unlikely to meet recommendations for good health and room for improvement. 51-60 More healthful dietary pattern, with some room for improvement.  >60 Healthy dietary pattern, although there may be some specific behaviors that could be improved.    Nutrition Goals Re-Evaluation:  Nutrition Goals Re-Evaluation     [place] Name 08/11/20 1458 08/16/20 1123           Goals   Current Weight 276 lb (125.2 kg) 274 lb 7.6 oz (124.5 kg)      Nutrition Goal -- Pt to identify food quantities necessary to achieve weight loss of 6-24 lb at graduation from  cardiac rehab.             Personal Goal #2 Re-Evaluation      Personal Goal #2 -- Pt to build a healthy plate including vegetables, fruits, whole grains, and low-fat dairy products in a heart healthy meal plan.             Personal Goal #3 Re-Evaluation      Personal Goal #3 -- Pt to create a meal plan              Nutrition Goals Re-Evaluation:  Nutrition Goals Re-Evaluation     [place] Name 08/11/20 1458 08/16/20 1123           Goals   Current Weight 276 lb (125.2 kg) 274 lb 7.6 oz (124.5 kg)      Nutrition Goal -- Pt to identify food quantities necessary to achieve weight loss of 6-24 lb at graduation from cardiac rehab.             Personal Goal #2 Re-Evaluation      Personal Goal #2 -- Pt to build a healthy plate including vegetables, fruits, whole grains, and low-fat dairy products in a heart healthy meal plan.             Personal Goal #3 Re-Evaluation      Personal Goal #3 -- Pt to create a meal plan              Nutrition Goals Discharge (Final Nutrition Goals Re-Evaluation):  Nutrition Goals Re-Evaluation -  08/16/20 1123       Goals   Current Weight 274 lb 7.6 oz (124.5 kg)    Nutrition Goal Pt to identify food quantities necessary to achieve weight loss of 6-24 lb at graduation from cardiac rehab.      Personal Goal #2 Re-Evaluation   Personal Goal #2 Pt to build a healthy plate including vegetables, fruits, whole grains, and low-fat dairy products in a heart healthy meal plan.      Personal Goal #3 Re-Evaluation   Personal Goal #3 Pt to create a meal plan             Psychosocial: Target Goals: Acknowledge presence or absence of significant depression and/or stress, maximize coping skills, provide positive support system. Participant is able to verbalize types and ability to use techniques and skills needed for reducing stress and depression.  Initial Review & Psychosocial Screening:  Initial Psych Review & Screening - 08/03/20 1424       Initial Review   Current issues with History of Depression;Current Stress Concerns    Source of Stress Concerns Chronic Illness;Unable to participate in former interests or hobbies    Comments Germain Osgood admits to being depressed and having anxiety regarding his recent CHF diagnosis and recent hospitalization. Berneta Sages is currently receiving couselling and is on an antidepressant and sees a psychiatrist.      Family Dynamics   Good Support System? Yes   Germain Osgood has his wife and children for support     Barriers   Psychosocial barriers to participate in program The patient should benefit from training in stress management and relaxation.      Screening Interventions   Interventions Encouraged to exercise;Provide feedback about the scores to participant;To provide support and resources with identified psychosocial needs    Expected Outcomes Long Term Goal: Stressors or current issues are controlled or eliminated.;Short Term goal: Identification and review with participant of any Quality of Life or Depression concerns found by  scoring the questionnaire.              Quality of Life Scores:  Quality of Life - 08/03/20 1423       Quality of Life   Select Quality of Life      Quality of Life Scores   Health/Function Pre 15.9 %    Socioeconomic Pre 23.14 %    Psych/Spiritual Pre 13.79 %    Family Pre 20.4 %    GLOBAL Pre 17.62 %            Scores of 19 and below usually indicate a poorer quality of life in these areas.  A difference of  2-3 points is a clinically meaningful difference.  A difference of 2-3 points in the total score of the Quality of Life Index has been associated with significant improvement in overall quality of life, self-image, physical symptoms, and general health in studies assessing change in quality of life.  PHQ-9: Recent Review Flowsheet Data     Depression screen [facility] 2/9 08/17/2020 08/17/2020 08/03/2020 03/02/2020 03/02/2020   Decreased Interest 0 0 0 3 0   Down, Depressed, Hopeless 0 0 1 3 0   PHQ - 2 Score 0 0 1 6 0   Altered sleeping 0 - - 3 -   Tired, decreased energy 0 - - 3 -   Change in appetite 0 - - 3 -   Feeling bad or failure about yourself  0 - - 3 -   Trouble concentrating 0 - - 3 -   Moving slowly or fidgety/restless 0 - - 2 -   Suicidal thoughts 0 - - 0 -   PHQ-9 Score 0 - - 23 -   Difficult doing work/chores Not difficult at all - - Somewhat difficult -      Interpretation of Total Score  Total Score Depression Severity:  1-4 = Minimal depression, 5-9 = Mild depression, 10-14 = Moderate depression, 15-19 = Moderately severe depression, 20-27 = Severe depression   Psychosocial Evaluation and Intervention:  Psychosocial Evaluation - 08/09/20 1445       Psychosocial Evaluation & Interventions   Interventions Stress management education;Relaxation education;Encouraged to exercise with the program and follow exercise prescription    Comments Patient quality of life slightly altered by physical constraints which limits ability to perform as prior to recent cardiac illness. Per pt he  always thought that he was healthy with no areas health wise for concern. This event has taken him by complete surprise and he admits that he feels anxious on a day to day basis because he wonders if he will have another event and will he survive. Advised pt to talk with his HF MD regarding resuming sex. Pt has supportive family which includes his wife and boys.  Offered emotional support and reassurance.    Expected Outcomes Germain Osgood will display positive outlook and report less anxiety along with   healthy coping skills    Continue Psychosocial Services  Follow up required by staff             Psychosocial Re-Evaluation:  Psychosocial Re-Evaluation     [place] Name 08/18/20 1223             Psychosocial Re-Evaluation   Current issues with Current Stress Concerns;History of Depression       Comments Germain Osgood has not voiced any increased depression or stress concerns since participating in phase 2 cardiac rehab. Beverely Risen of life reviewed on 08/09/20  Expected Outcomes Berneta Sages will have decreased stressors/ depression upon completion of phase 2 cardiac rehab.       Interventions Stress management education;Encouraged to attend Cardiac Rehabilitation for the exercise       Continue Psychosocial Services  Follow up required by staff               Initial Review     Source of Stress Concerns Chronic Illness;Unable to participate in former interests or hobbies;Unable to perform yard/household activities;Poor Coping Skills       Comments Will continue to monitor and offer support as needed               Psychosocial Discharge (Final Psychosocial Re-Evaluation):  Psychosocial Re-Evaluation - 08/18/20 1223       Psychosocial Re-Evaluation   Current issues with Current Stress Concerns;History of Depression    Comments Germain Osgood has not voiced any increased depression or stress concerns since participating in phase 2 cardiac rehab. Beverely Risen of life reviewed on 08/09/20    Expected Outcomes  Berneta Sages will have decreased stressors/ depression upon completion of phase 2 cardiac rehab.    Interventions Stress management education;Encouraged to attend Cardiac Rehabilitation for the exercise    Continue Psychosocial Services  Follow up required by staff      Initial Review   Source of Stress Concerns Chronic Illness;Unable to participate in former interests or hobbies;Unable to perform yard/household activities;Poor Coping Skills    Comments Will continue to monitor and offer support as needed             Vocational Rehabilitation: Provide vocational rehab assistance to qualifying candidates.   Vocational Rehab Evaluation & Intervention:  Vocational Rehab - 08/03/20 1430       Initial Vocational Rehab Evaluation & Intervention   Assessment shows need for Vocational Rehabilitation No   Germain Osgood is on short term disabity from his job and does not need vocational rehab at this time.            Education: Education Goals: Education classes will be provided on a weekly basis, covering required topics. Participant will state understanding/return demonstration of topics presented.  Learning Barriers/Preferences:  Learning Barriers/Preferences - 08/03/20 1632       Learning Barriers/Preferences   Learning Barriers Sight   reading glasses   Learning Preferences Pictoral;Skilled Demonstration;Video             Education Topics: Count Your Pulse:  -Group instruction provided by verbal instruction, demonstration, patient participation and written materials to support subject.  Instructors address importance of being able to find your pulse and how to count your pulse when at home without a heart monitor.  Patients get hands on experience counting their pulse with staff help and individually.   Heart Attack, Angina, and Risk Factor Modification:  -Group instruction provided by verbal instruction, video, and written materials to support subject.  Instructors address signs  and symptoms of angina and heart attacks.    Also discuss risk factors for heart disease and how to make changes to improve heart health risk factors.   Functional Fitness:  -Group instruction provided by verbal instruction, demonstration, patient participation, and written materials to support subject.  Instructors address safety measures for doing things around the house.  Discuss how to get up and down off the floor, how to pick things up properly, how to safely get out of a chair without assistance, and balance training.   Meditation and Mindfulness:  -Group instruction provided by verbal instruction, patient  participation, and written materials to support subject.  Instructor addresses importance of mindfulness and meditation practice to help reduce stress and improve awareness.  Instructor also leads participants through a meditation exercise.    Stretching for Flexibility and Mobility:  -Group instruction provided by verbal instruction, patient participation, and written materials to support subject.  Instructors lead participants through series of stretches that are designed to increase flexibility thus improving mobility.  These stretches are additional exercise for major muscle groups that are typically performed during regular warm up and cool down.   Hands Only CPR:  -Group verbal, video, and participation provides a basic overview of AHA guidelines for community CPR. Role-play of emergencies allow participants the opportunity to practice calling for help and chest compression technique with discussion of AED use.   Hypertension: -Group verbal and written instruction that provides a basic overview of hypertension including the most recent diagnostic guidelines, risk factor reduction with self-care instructions and medication management.    Nutrition I class: Heart Healthy Eating:  -Group instruction provided by PowerPoint slides, verbal discussion, and written materials to  support subject matter. The instructor gives an explanation and review of the Therapeutic Lifestyle Changes diet recommendations, which includes a discussion on lipid goals, dietary fat, sodium, fiber, plant stanol/sterol esters, sugar, and the components of a well-balanced, healthy diet.   Nutrition II class: Lifestyle Skills:  -Group instruction provided by PowerPoint slides, verbal discussion, and written materials to support subject matter. The instructor gives an explanation and review of label reading, grocery shopping for heart health, heart healthy recipe modifications, and ways to make healthier choices when eating out.   Diabetes Question & Answer:  -Group instruction provided by PowerPoint slides, verbal discussion, and written materials to support subject matter. The instructor gives an explanation and review of diabetes co-morbidities, pre- and post-prandial blood glucose goals, pre-exercise blood glucose goals, signs, symptoms, and treatment of hypoglycemia and hyperglycemia, and foot care basics.   Diabetes Blitz:  -Group instruction provided by PowerPoint slides, verbal discussion, and written materials to support subject matter. The instructor gives an explanation and review of the physiology behind type 1 and type 2 diabetes, diabetes medications and rational behind using different medications, pre- and post-prandial blood glucose recommendations and Hemoglobin A1c goals, diabetes diet, and exercise including blood glucose guidelines for exercising safely.    Portion Distortion:  -Group instruction provided by PowerPoint slides, verbal discussion, written materials, and food models to support subject matter. The instructor gives an explanation of serving size versus portion size, changes in portions sizes over the last 20 years, and what consists of a serving from each food group.   Stress Management:  -Group instruction provided by verbal instruction, video, and written  materials to support subject matter.  Instructors review role of stress in heart disease and how to cope with stress positively.     Exercising on Your Own:  -Group instruction provided by verbal instruction, power point, and written materials to support subject.  Instructors discuss benefits of exercise, components of exercise, frequency and intensity of exercise, and end points for exercise.  Also discuss use of nitroglycerin and activating EMS.  Review options of places to exercise outside of rehab.  Review guidelines for sex with heart disease.   Cardiac Drugs I:  -Group instruction provided by verbal instruction and written materials to support subject.  Instructor reviews cardiac drug classes: antiplatelets, anticoagulants, beta blockers, and statins.  Instructor discusses reasons, side effects, and lifestyle considerations for each drug class.  Cardiac Drugs II:  -Group instruction provided by verbal instruction and written materials to support subject.  Instructor reviews cardiac drug classes: angiotensin converting enzyme inhibitors (ACE-I), angiotensin II receptor blockers (ARBs), nitrates, and calcium channel blockers.  Instructor discusses reasons, side effects, and lifestyle considerations for each drug class.   Anatomy and Physiology of the Circulatory System:  Group verbal and written instruction and models provide basic cardiac anatomy and physiology, with the coronary electrical and arterial systems. Review of: AMI, Angina, Valve disease, Heart Failure, Peripheral Artery Disease, Cardiac Arrhythmia, Pacemakers, and the ICD.   Other Education:  -Group or individual verbal, written, or video instructions that support the educational goals of the cardiac rehab program.   Holiday Eating Survival Tips:  -Group instruction provided by PowerPoint slides, verbal discussion, and written materials to support subject matter. The instructor gives patients tips, tricks, and techniques to  help them not only survive but enjoy the holidays despite the onslaught of food that accompanies the holidays.   Knowledge Questionnaire Score:  Knowledge Questionnaire Score - 08/03/20 1416       Knowledge Questionnaire Score   Pre Score 23/24             Core Components/Risk Factors/Patient Goals at Admission:  Personal Goals and Risk Factors at Admission - 08/03/20 1330       Core Components/Risk Factors/Patient Goals on Admission    Weight Management Yes;Obesity    Intervention Weight Management/Obesity: Establish reasonable short term and long term weight goals.;Obesity: Provide education and appropriate resources to help participant work on and attain dietary goals.    Admit Weight 276 lb 3.8 oz (125.3 kg)    Goal Weight: Short Term 268 lb (121.6 kg)    Goal Weight: Long Term 235 lb (106.6 kg)    Expected Outcomes Short Term: Continue to assess and modify interventions until short term weight is achieved;Long Term: Adherence to nutrition and physical activity/exercise program aimed toward attainment of established weight goal;Weight Loss: Understanding of general recommendations for a balanced deficit meal plan, which promotes 1-2 lb weight loss per week and includes a negative energy balance of [phone] kcal/d    Heart Failure Yes    Intervention Provide a combined exercise and nutrition program that is supplemented with education, support and counseling about heart failure. Directed toward relieving symptoms such as shortness of breath, decreased exercise tolerance, and extremity edema.    Expected Outcomes Improve functional capacity of life;Short term: Attendance in program 2-3 days a week with increased exercise capacity. Reported lower sodium intake. Reported increased fruit and vegetable intake. Reports medication compliance.;Short term: Daily weights obtained and reported for increase. Utilizing diuretic protocols set by physician.;Long term: Adoption of self-care skills  and reduction of barriers for early signs and symptoms recognition and intervention leading to self-care maintenance.    Hypertension Yes    Intervention Provide education on lifestyle modifcations including regular physical activity/exercise, weight management, moderate sodium restriction and increased consumption of fresh fruit, vegetables, and low fat dairy, alcohol moderation, and smoking cessation.;Monitor prescription use compliance.    Expected Outcomes Short Term: Continued assessment and intervention until BP is < 140/54mm HG in hypertensive participants. < 130/74mm HG in hypertensive participants with diabetes, heart failure or chronic kidney disease.;Long Term: Maintenance of blood pressure at goal levels.    Stress Yes    Intervention Offer individual and/or small group education and counseling on adjustment to heart disease, stress management and health-related lifestyle change. Teach and support self-help strategies.;Refer participants experiencing  significant psychosocial distress to appropriate mental health specialists for further evaluation and treatment. When possible, include family members and significant others in education/counseling sessions.    Expected Outcomes Short Term: Participant demonstrates changes in health-related behavior, relaxation and other stress management skills, ability to obtain effective social support, and compliance with psychotropic medications if prescribed.;Long Term: Emotional wellbeing is indicated by absence of clinically significant psychosocial distress or social isolation.             Core Components/Risk Factors/Patient Goals Review:   Goals and Risk Factor Review     Row Name 08/18/20 1227             Core Components/Risk Factors/Patient Goals Review   Personal Goals Review Weight Management/Obesity;Heart Failure;Hypertension;Stress       Review Germain Osgood is off to a great start to exercise at cardiac rehab.  vital signs have been  stable.Germain Osgood has lost 2.0 kg since starting CR       Expected Outcomes Germain Osgood will continue to partcipate in phase 2 cardiac rehab for exercise, nutrtion and life style modifications                Core Components/Risk Factors/Patient Goals at Discharge (Final Review):   Goals and Risk Factor Review - 08/18/20 1227       Core Components/Risk Factors/Patient Goals Review   Personal Goals Review Weight Management/Obesity;Heart Failure;Hypertension;Stress    Review Germain Osgood is off to a great start to exercise at cardiac rehab.  vital signs have been stable.Germain Osgood has lost 2.0 kg since starting CR    Expected Outcomes Germain Osgood will continue to partcipate in phase 2 cardiac rehab for exercise, nutrtion and life style modifications             ITP Comments:  ITP Comments     Row Name 08/03/20 1318 08/18/20 1221         ITP Comments Medical Director- Dr. Fransico Him, MD 30 Day ITP Review. Germain Osgood has good attendance and participation in phase 2 cardaic rehab. Germain Osgood is off to a good start to exercise.               Comments: See ITP Comments

## 2020-08-17 NOTE — Patient Instructions (Addendum)
   GO TO THE LAB : Get the blood work     GO TO THE FRONT DESK, PLEASE SCHEDULE YOUR APPOINTMENTS Come back for a physical exam in 6 months 

## 2020-08-17 NOTE — Assessment & Plan Note (Signed)
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 Had a thrombectomy. Last visit with cardiology 08/13/2020, felt to be stable, on Eliquis. 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 Anxiety, depression: Now under the care of psychiatry for anxiety and adjustment disorder, last visit 08/12/2020.  Felt to be improving.  The patient today states he is doing great. Prediabetes: Check A1c Morbid obesity:~ a year ago his weight was 328 pounds, today is 272 pounds.  He is doing great with diet and exercise, praised! Gout: RF allopurinol, no symptoms, check a uric acid. Preventive care: Encouraged to proceed with a COVID-vaccine, encouraged a flu shot yearly. RTC CPX 6 months.

## 2020-08-17 NOTE — Telephone Encounter (Signed)
Patient wanted to follow up on charge back. He states that he did go ahead ane pay it . But was under the impression he would be refunded,Please see if you can research to see if patient can and will be still be refunded.

## 2020-08-18 ENCOUNTER — Other Ambulatory Visit: Payer: Self-pay

## 2020-08-18 ENCOUNTER — Encounter (HOSPITAL_COMMUNITY)
Admission: RE | Admit: 2020-08-18 | Discharge: 2020-08-18 | Disposition: A | Discharge status: Home / Self Care | Payer: 59 | Source: Ambulatory Visit | Attending: Internal Medicine | Admitting: Internal Medicine

## 2020-08-18 DIAGNOSIS — I5022 Chronic systolic (congestive) heart failure: Secondary | ICD-10-CM

## 2020-08-18 DIAGNOSIS — I509 Heart failure, unspecified: Secondary | ICD-10-CM | POA: Diagnosis not present

## 2020-08-19 NOTE — Telephone Encounter (Signed)
I spoke with patient after his appointment regarding his billing question/issue.  I advised patient to call the billing office number and provided that number to him because I advised patient I had already emailed our charge correction team to remove that $50 no show fee from his account and that had been done previously back in May when I spoke with him.  Patient stated he would get in touch with the billing department.

## 2020-08-20 ENCOUNTER — Other Ambulatory Visit: Payer: Self-pay

## 2020-08-20 ENCOUNTER — Encounter (HOSPITAL_COMMUNITY)
Admission: RE | Admit: 2020-08-20 | Discharge: 2020-08-20 | Disposition: A | Discharge status: Home / Self Care | Payer: 59 | Source: Ambulatory Visit | Attending: Internal Medicine | Admitting: Internal Medicine

## 2020-08-20 DIAGNOSIS — I509 Heart failure, unspecified: Secondary | ICD-10-CM | POA: Diagnosis not present

## 2020-08-20 DIAGNOSIS — I5022 Chronic systolic (congestive) heart failure: Secondary | ICD-10-CM

## 2020-08-23 ENCOUNTER — Other Ambulatory Visit: Payer: Self-pay

## 2020-08-23 ENCOUNTER — Encounter (HOSPITAL_COMMUNITY)
Admission: RE | Admit: 2020-08-23 | Discharge: 2020-08-23 | Disposition: A | Discharge status: Home / Self Care | Payer: 59 | Source: Ambulatory Visit | Attending: Internal Medicine | Admitting: Internal Medicine

## 2020-08-23 DIAGNOSIS — I5022 Chronic systolic (congestive) heart failure: Secondary | ICD-10-CM

## 2020-08-23 DIAGNOSIS — I509 Heart failure, unspecified: Secondary | ICD-10-CM | POA: Diagnosis not present

## 2020-08-23 NOTE — Progress Notes (Signed)
Reviewed home exercise guidelines with patient including endpoints, temperature precautions, target heart rate and rate of perceived exertion. Patient is currently walking 35 minutes, 4 days/week as his mode of home exercise. Patient also has a [employer] bike that he has started using again. Patient voices understanding of instructions given.  Sol Passer, MS, ACSM CEP

## 2020-08-25 ENCOUNTER — Encounter (HOSPITAL_COMMUNITY)
Admission: RE | Admit: 2020-08-25 | Discharge: 2020-08-25 | Disposition: A | Discharge status: Home / Self Care | Payer: 59 | Source: Ambulatory Visit | Attending: Internal Medicine | Admitting: Internal Medicine

## 2020-08-25 ENCOUNTER — Other Ambulatory Visit: Payer: Self-pay

## 2020-08-25 DIAGNOSIS — I509 Heart failure, unspecified: Secondary | ICD-10-CM | POA: Diagnosis not present

## 2020-08-25 DIAGNOSIS — I5022 Chronic systolic (congestive) heart failure: Secondary | ICD-10-CM

## 2020-08-27 ENCOUNTER — Other Ambulatory Visit: Payer: Self-pay

## 2020-08-27 ENCOUNTER — Encounter (HOSPITAL_COMMUNITY)
Admission: RE | Admit: 2020-08-27 | Discharge: 2020-08-27 | Disposition: A | Discharge status: Home / Self Care | Payer: 59 | Source: Ambulatory Visit | Attending: Internal Medicine | Admitting: Internal Medicine

## 2020-08-27 DIAGNOSIS — I5022 Chronic systolic (congestive) heart failure: Secondary | ICD-10-CM

## 2020-08-27 DIAGNOSIS — I509 Heart failure, unspecified: Secondary | ICD-10-CM | POA: Diagnosis not present

## 2020-08-30 ENCOUNTER — Other Ambulatory Visit: Payer: Self-pay

## 2020-08-30 ENCOUNTER — Encounter (HOSPITAL_COMMUNITY)
Admission: RE | Admit: 2020-08-30 | Discharge: 2020-08-30 | Disposition: A | Discharge status: Home / Self Care | Payer: 59 | Source: Ambulatory Visit | Attending: Internal Medicine | Admitting: Internal Medicine

## 2020-08-30 DIAGNOSIS — I5022 Chronic systolic (congestive) heart failure: Secondary | ICD-10-CM | POA: Insufficient documentation

## 2020-08-30 NOTE — Progress Notes (Signed)
Nutrition Note  Spoke with pt.  He has started meal planning. He continues to choose low sodium ingredients.  He would also like to lose weight.  He has a good understanding of high calorie foods. We discussed calorie deficit for weight loss and nutrition strategies such as eating consistently across the day, balanced meals with protein, adequate non starchy vegetables.  Reviewed different food tracking apps for pt to use.  Nutrition Diagnosis  Obese   II = 35-39.9 related to excessive energy intake as evidenced by a BMI 36.92 kg/m2   Nutrition Intervention  Pt's individual nutrition plan reviewed with pt. Low sodium diet, 2300-2500 kcals/day for weight loss Continue client-centered nutrition education by RD, as part of interdisciplinary care.   Goal(s) Pt to identify food quantities necessary to achieve weight loss of 6-24 lb at graduation from cardiac rehab. Pt to build a healthy plate including vegetables, fruits, whole grains, and low-fat dairy products in a heart healthy meal plan. Pt to create a meal plan    Plan:  Will provide client-centered nutrition education as part of interdisciplinary care Monitor and evaluate progress toward nutrition goal with team.  Michaele Offer, MS, RDN, LDN, CDCES

## 2020-09-01 ENCOUNTER — Encounter (HOSPITAL_COMMUNITY)
Admission: RE | Admit: 2020-09-01 | Discharge: 2020-09-01 | Disposition: A | Discharge status: Home / Self Care | Payer: 59 | Source: Ambulatory Visit | Attending: Internal Medicine | Admitting: Internal Medicine

## 2020-09-01 ENCOUNTER — Other Ambulatory Visit: Payer: Self-pay

## 2020-09-01 DIAGNOSIS — I5022 Chronic systolic (congestive) heart failure: Secondary | ICD-10-CM | POA: Diagnosis not present

## 2020-09-02 ENCOUNTER — Other Ambulatory Visit: Payer: Self-pay | Admitting: Internal Medicine

## 2020-09-02 ENCOUNTER — Telehealth (HOSPITAL_COMMUNITY): Payer: Self-pay | Admitting: Internal Medicine

## 2020-09-02 NOTE — Telephone Encounter (Signed)
Okay to continue / refill this Sir?

## 2020-09-03 ENCOUNTER — Encounter (HOSPITAL_COMMUNITY): Payer: 59

## 2020-09-06 ENCOUNTER — Other Ambulatory Visit: Payer: Self-pay

## 2020-09-06 ENCOUNTER — Encounter (HOSPITAL_COMMUNITY)
Admission: RE | Admit: 2020-09-06 | Discharge: 2020-09-06 | Disposition: A | Discharge status: Home / Self Care | Payer: 59 | Source: Ambulatory Visit | Attending: Internal Medicine | Admitting: Internal Medicine

## 2020-09-06 DIAGNOSIS — I5022 Chronic systolic (congestive) heart failure: Secondary | ICD-10-CM

## 2020-09-06 NOTE — Progress Notes (Signed)
Weight up 1.9 kg from 09/01/20. Weight today 126.4 kg. Weight on 09/01/20 124.5 kg. Kevin Baxter denies  any shortness of breath or swelling. Kevin Baxter reported that he ate [place] food yesterday , attended an [employer] track mett over the weekend with his children and had some salted chips. Oxygen saturation 98% on room air. Lung fields clear upon ausculation. No peripheral edema noted. No complaints voiced during exercise today. Will continue to monitor the patient throughout  the program. Kevin Baxter returned to work today.Barnet Pall, RN,BSN 09/06/2020 4:47 PM

## 2020-09-08 ENCOUNTER — Encounter (HOSPITAL_COMMUNITY): Payer: 59

## 2020-09-09 ENCOUNTER — Encounter (HOSPITAL_COMMUNITY): Payer: Self-pay | Admitting: *Deleted

## 2020-09-09 NOTE — Progress Notes (Signed)
Certification for FMLA form completed, signed by Dr Haroldine Laws, and faxed to [place] Dept at [phone]

## 2020-09-10 ENCOUNTER — Other Ambulatory Visit: Payer: Self-pay

## 2020-09-10 ENCOUNTER — Encounter (HOSPITAL_COMMUNITY)
Admission: RE | Admit: 2020-09-10 | Discharge: 2020-09-10 | Disposition: A | Discharge status: Home / Self Care | Payer: 59 | Source: Ambulatory Visit | Attending: Internal Medicine | Admitting: Internal Medicine

## 2020-09-10 DIAGNOSIS — I5022 Chronic systolic (congestive) heart failure: Secondary | ICD-10-CM | POA: Diagnosis not present

## 2020-09-13 ENCOUNTER — Other Ambulatory Visit: Payer: Self-pay

## 2020-09-13 ENCOUNTER — Encounter (HOSPITAL_COMMUNITY)
Admission: RE | Admit: 2020-09-13 | Discharge: 2020-09-13 | Disposition: A | Discharge status: Home / Self Care | Payer: 59 | Source: Ambulatory Visit | Attending: Internal Medicine | Admitting: Internal Medicine

## 2020-09-13 DIAGNOSIS — I5022 Chronic systolic (congestive) heart failure: Secondary | ICD-10-CM | POA: Diagnosis not present

## 2020-09-13 NOTE — Progress Notes (Signed)
Cardiac Individual Treatment Plan  Patient Details  Name: Kevin Baxter MRN: [id number] Date of Birth: 11-Aug-1970 Referring Provider:   Flowsheet Row CARDIAC REHAB PHASE II ORIENTATION from 08/03/2020 in [place]  Referring Provider Bensimhon, [name] Pascal, MD       Initial Encounter Date:  [place] PHASE II ORIENTATION from 08/03/2020 in [place]  Date 08/03/20       Visit Diagnosis: 04/11/20 CHF  Patient's Home Medications on Admission:  Current Outpatient Medications:    allopurinol (ZYLOPRIM) 300 MG tablet, Take 1 tablet (300 mg total) by mouth daily., Disp: 90 tablet, Rfl: 3   apixaban (ELIQUIS) 5 MG TABS tablet, Take 1 tablet (5 mg total) by mouth 2 (two) times daily., Disp: 180 tablet, Rfl: 1   carvedilol (COREG) 3.125 MG tablet, Take 1 tablet (3.125 mg total) by mouth 2 (two) times daily with a meal., Disp: 60 tablet, Rfl: 6   empagliflozin (JARDIANCE) 10 MG TABS tablet, Take 1 tablet (10 mg total) by mouth daily., Disp: 30 tablet, Rfl: 6   ivabradine (CORLANOR) 5 MG TABS tablet, Take 1 tablet (5 mg total) by mouth 2 (two) times daily with a meal., Disp: 180 tablet, Rfl: 1   ondansetron (ZOFRAN ODT) 4 MG disintegrating tablet, Take 1 tablet (4 mg total) by mouth every 8 (eight) hours as needed for nausea or vomiting., Disp: 20 tablet, Rfl: 0   pantoprazole (PROTONIX) 40 MG tablet, Take 1 tablet (40 mg total) by mouth daily., Disp: 90 tablet, Rfl: 0   sacubitril-valsartan (ENTRESTO) 24-26 MG, Take 1 tablet by mouth 2 (two) times daily., Disp: 60 tablet, Rfl: 6   sertraline (ZOLOFT) 50 MG tablet, Take 1 tablet (50 mg total) by mouth daily., Disp: 90 tablet, Rfl: 0   spironolactone (ALDACTONE) 25 MG tablet, Take 0.5 tablets (12.5 mg total) by mouth daily., Disp: 30 tablet, Rfl: 6   sucralfate (CARAFATE) 1 g tablet, TAKE 1 TABLET (1 G TOTAL) BY MOUTH 4 (FOUR) TIMES DAILY - WITH MEALS AND AT  BEDTIME., Disp: 120 tablet, Rfl: 0  Past Medical History: Past Medical History:  Diagnosis Date   Allergy    Anxiety    Arthritis    CHF (congestive heart failure) (HCC)    Fundic gland polyposis of stomach    GERD with esophagitis    Gout    Hx of adenomatous colonic polyps    Hypertension     Tobacco Use: Social History   Tobacco Use  Smoking Status Never  Smokeless Tobacco Never    Labs: Recent Review Flowsheet Data     Labs for ITP Cardiac and Pulmonary Rehab Latest Ref Rng & Units 04/22/2020 04/23/2020 04/24/2020 04/24/2020 08/17/2020   Cholestrol 0 - 200 mg/dL - - - - 212(H)   LDLCALC 0 - 99 mg/dL - - - - 124(H)   HDL >39.00 mg/dL - - - - 63.30   Trlycerides 0.0 - 149.0 mg/dL - - - - 123.0   Hemoglobin A1c 4.6 - 6.5 % - - - - 5.6   PHART 7.350 - 7.450 - - - - -   PCO2ART 32.0 - 48.0 mmHg - - - - -   HCO3 20.0 - 28.0 mmol/L - - - - -   TCO2 22 - 32 mmol/L - - - - -   O2SAT % 56.7 62.1 50.5 56.2 -       Capillary Blood Glucose: No results found  for: GLUCAP   Exercise Target Goals: Exercise Program Goal: Individual exercise prescription set using results from initial 6 min walk test and THRR while considering  patient's activity barriers and safety.   Exercise Prescription Goal: Initial exercise prescription builds to 30-45 minutes a day of aerobic activity, 2-3 days per week.  Home exercise guidelines will be given to patient during program as part of exercise prescription that the participant will acknowledge.  Activity Barriers & Risk Stratification:  Activity Barriers & Cardiac Risk Stratification - 08/03/20 1330       Activity Barriers & Cardiac Risk Stratification   Activity Barriers None    Cardiac Risk Stratification High             6 Minute Walk:  6 Minute Walk     Row Name 08/03/20 1342         6 Minute Walk   Phase Initial     Distance 1684 feet     Walk Time 6 minutes     # of Rest Breaks 0     MPH 3.19     METS 4.24     RPE  9     Perceived Dyspnea  0     VO2 Peak 14.84     Symptoms No     Resting HR 93 bpm     Resting BP 124/78     Resting Oxygen Saturation  98 %     Exercise Oxygen Saturation  during 6 min walk 98 %     Max Ex. HR 110 bpm     Max Ex. BP 142/82     2 Minute Post BP 128/82              Oxygen Initial Assessment:   Oxygen Re-Evaluation:   Oxygen Discharge (Final Oxygen Re-Evaluation):   Initial Exercise Prescription:  Initial Exercise Prescription - 08/03/20 1400       Date of Initial Exercise RX and Referring Provider   Date 08/03/20    Referring Provider Bensimhon, [name] Pascal, MD    Expected Discharge Date 10/01/20      Treadmill   MPH 2.8    Grade 0    Minutes 15    METs 3.14      T5 Nustep   Level 3    SPM 85    Minutes 15    METs 3      Prescription Details   Frequency (times per week) 3    Duration Progress to 30 minutes of continuous aerobic without signs/symptoms of physical distress      Intensity   THRR 40-80% of Max Heartrate 68-135    Ratings of Perceived Exertion 11-13    Perceived Dyspnea 0-4      Progression   Progression Continue to progress workloads to maintain intensity without signs/symptoms of physical distress.      Resistance Training   Training Prescription Yes    Weight 5 lbs    Reps 10-15             Perform Capillary Blood Glucose checks as needed.  Exercise Prescription Changes:   Exercise Prescription Changes     Row Name 08/09/20 1119 08/16/20 1059 08/30/20 1113         Response to Exercise   Blood Pressure (Admit) 110/80 134/90 110/70     Blood Pressure (Exercise) 124/70 160/84 138/70     Blood Pressure (Exit) 116/72 128/86 114/70     Heart Rate (Admit) 99 bpm 98  bpm 94 bpm     Heart Rate (Exercise) 122 bpm 115 bpm 126 bpm     Heart Rate (Exit) 102 bpm 98 bpm 99 bpm     Rating of Perceived Exertion (Exercise) '10 12 10     '$ Symptoms none none none     Comments Off to a good start with exercise. Blood  pressure mildly elevated at rest and with exercise. --     Duration Continue with 30 min of aerobic exercise without signs/symptoms of physical distress. Continue with 30 min of aerobic exercise without signs/symptoms of physical distress. Continue with 30 min of aerobic exercise without signs/symptoms of physical distress.     Intensity THRR unchanged THRR unchanged THRR unchanged           Progression   Progression Continue to progress workloads to maintain intensity without signs/symptoms of physical distress. Continue to progress workloads to maintain intensity without signs/symptoms of physical distress. Continue to progress workloads to maintain intensity without signs/symptoms of physical distress.     Average METs 2.6 2.9 3.4           Resistance Training   Training Prescription Yes Yes Yes     Weight 5 lbs 5 lbs 6 lbs     Reps 10-15 10-15 10-15     Time 10 Minutes 10 Minutes 10 Minutes           Interval Training   Interval Training No No No           Treadmill   MPH 2.8 3 --     Grade 0 1 --     Minutes 15 15 --     METs 3.14 3.71 --           T5 Nustep   Level '3 3 5     '$ SPM 85 85 85     Minutes '15 15 15     '$ METs 2 2.2 3.3           Track   Laps -- -- 28     Minutes -- -- 20     METs -- -- 3.44           Home Exercise Plan   Plans to continue exercise at -- -- Home (comment)  Walking     Frequency -- -- Add 4 additional days to program exercise sessions.     Initial Home Exercises Provided -- -- 08/23/20              Exercise Comments:   Exercise Comments     Row Name 08/09/20 1119 08/16/20 1114 08/23/20 1122 08/30/20 1203 09/13/20 1104   Exercise Comments Patient tolerated low intensity exercise well without symptoms. Oriented to hand weight exercises. Reviewed METs and goals with patient. Increased speed and incline on treadmill and level on recumbent stepper. Reviewed home exercise guidelines and goals with patient. Reviewed METs and goals with  patient. Reviewed METs and goals with patient.            Exercise Goals and Review:   Exercise Goals     Row Name 08/03/20 1340             Exercise Goals   Increase Physical Activity Yes       Intervention Provide advice, education, support and counseling about physical activity/exercise needs.;Develop an individualized exercise prescription for aerobic and resistive training based on initial evaluation findings, risk stratification, comorbidities and participant's personal goals.  Expected Outcomes Short Term: Attend rehab on a regular basis to increase amount of physical activity.;Long Term: Exercising regularly at least 3-5 days a week.;Long Term: Add in home exercise to make exercise part of routine and to increase amount of physical activity.       Increase Strength and Stamina Yes       Intervention Provide advice, education, support and counseling about physical activity/exercise needs.;Develop an individualized exercise prescription for aerobic and resistive training based on initial evaluation findings, risk stratification, comorbidities and participant's personal goals.       Expected Outcomes Short Term: Increase workloads from initial exercise prescription for resistance, speed, and METs.;Short Term: Perform resistance training exercises routinely during rehab and add in resistance training at home;Long Term: Improve cardiorespiratory fitness, muscular endurance and strength as measured by increased METs and functional capacity (6MWT)       Able to understand and use rate of perceived exertion (RPE) scale Yes       Intervention Provide education and explanation on how to use RPE scale       Expected Outcomes Short Term: Able to use RPE daily in rehab to express subjective intensity level;Long Term:  Able to use RPE to guide intensity level when exercising independently       Knowledge and understanding of Target Heart Rate Range (THRR) Yes       Intervention Provide  education and explanation of THRR including how the numbers were predicted and where they are located for reference       Expected Outcomes Short Term: Able to state/look up THRR;Long Term: Able to use THRR to govern intensity when exercising independently;Short Term: Able to use daily as guideline for intensity in rehab       Able to check pulse independently Yes       Intervention Provide education and demonstration on how to check pulse in carotid and radial arteries.;Review the importance of being able to check your own pulse for safety during independent exercise       Expected Outcomes Short Term: Able to explain why pulse checking is important during independent exercise;Long Term: Able to check pulse independently and accurately       Understanding of Exercise Prescription Yes       Intervention Provide education, explanation, and written materials on patient's individual exercise prescription       Expected Outcomes Short Term: Able to explain program exercise prescription;Long Term: Able to explain home exercise prescription to exercise independently                Exercise Goals Re-Evaluation :  Exercise Goals Re-Evaluation     Row Name 08/09/20 1119 08/16/20 1114 08/23/20 1122 08/30/20 1203 09/13/20 1104     Exercise Goal Re-Evaluation   Exercise Goals Review Increase Physical Activity;Able to understand and use rate of perceived exertion (RPE) scale Increase Physical Activity;Able to understand and use rate of perceived exertion (RPE) scale Increase Physical Activity;Able to understand and use rate of perceived exertion (RPE) scale;Understanding of Exercise Prescription;Increase Strength and Stamina;Knowledge and understanding of Target Heart Rate Range (THRR);Able to check pulse independently Increase Physical Activity;Able to understand and use rate of perceived exertion (RPE) scale;Understanding of Exercise Prescription;Increase Strength and Stamina;Knowledge and understanding of  Target Heart Rate Range (THRR);Able to check pulse independently Increase Physical Activity;Able to understand and use rate of perceived exertion (RPE) scale;Understanding of Exercise Prescription;Increase Strength and Stamina;Knowledge and understanding of Target Heart Rate Range (THRR);Able to check pulse independently  Comments Patient able to understand and use RPE scale appropriately. Reviewed THRR, METs, and RPE scale with patient. Patient is walking at home in addition to exercise at cardiac rehab. Increased wrokloads today. Reviewed home exercise prescription with patient. Patient is walking 35 minutes, 4 days week.Patient started back riding his NordicTrack bike yesterday and tolerated well. Patient has weights at home but wants to wants to wait until he gets more comfortable before he starts weights at home. Increased from 5 lb to 6 lb weights today at [facility] and tolerated well. Patient would like to walk the track instead of the treadmill some days, and I reassured him that he can do either the treadmill or the track for his walking station. Patient has a smart watch to check his pulse. Patient is progressing well with exercise. Patient is walking the track and using the NuStep machine for his aerobic exercise prescription at cardiac rehab. Patient states he feels good and is progressing well with exercise. Patient is walking on the days he doesn't attend cardiac rehab and asked if he can ride his Nordic bike at home for ~20 minutes on the days he attends cardiac rehab. I informed patient riding his bike and additional 20 minutes would be fine. Patient asked how long he should wait after eating to ride his bike, and I advised him to wait at least 30 minutes after eating.   Expected Outcomes Progress workloads as tolerated to help achieve personal health and fitness goals. Continue to progress workloads as tolerated to help increase cardiorespiratory fitness. Progress workloads as tolerated to help gain  confidence and comfort with exercise routine. Continue current exercise prescription. Patient will add 20 minutes of aerobic exercise, riding his stationary bike at least 3 days/week to help achieve personal health and fitness goals.            Discharge Exercise Prescription (Final Exercise Prescription Changes):  Exercise Prescription Changes - 08/30/20 1113       Response to Exercise   Blood Pressure (Admit) 110/70    Blood Pressure (Exercise) 138/70    Blood Pressure (Exit) 114/70    Heart Rate (Admit) 94 bpm    Heart Rate (Exercise) 126 bpm    Heart Rate (Exit) 99 bpm    Rating of Perceived Exertion (Exercise) 10    Symptoms none    Duration Continue with 30 min of aerobic exercise without signs/symptoms of physical distress.    Intensity THRR unchanged      Progression   Progression Continue to progress workloads to maintain intensity without signs/symptoms of physical distress.    Average METs 3.4      Resistance Training   Training Prescription Yes    Weight 6 lbs    Reps 10-15    Time 10 Minutes      Interval Training   Interval Training No      T5 Nustep   Level 5    SPM 85    Minutes 15    METs 3.3      Track   Laps 28    Minutes 20    METs 3.44      Home Exercise Plan   Plans to continue exercise at Home (comment)   Walking   Frequency Add 4 additional days to program exercise sessions.    Initial Home Exercises Provided 08/23/20             Nutrition:  Target Goals: Understanding of nutrition guidelines, daily intake of sodium '1500mg'$ , cholesterol <  $'200mg'z$ , calories 30% from fat and 7% or less from saturated fats, daily to have 5 or more servings of fruits and vegetables.  Biometrics:  Pre Biometrics - 08/03/20 1318       Pre Biometrics   Waist Circumference 49 inches    Hip Circumference 51 inches    Waist to Hip Ratio 0.96 %    Triceps Skinfold 31 mm    % Body Fat 37.1 %    Grip Strength 52.5 kg    Flexibility 14.63 in    Single  Leg Stand 30 seconds              Nutrition Therapy Plan and Nutrition Goals:  Nutrition Therapy & Goals - 08/11/20 1458       Nutrition Therapy   Diet TLC      Personal Nutrition Goals   Nutrition Goal Pt to identify food quantities necessary to achieve weight loss of 6-24 lb at graduation from cardiac rehab.    Personal Goal #2 Pt to build a healthy plate including vegetables, fruits, whole grains, and low-fat dairy products in a heart healthy meal plan.    Personal Goal #3 Pt to create a meal plan      Intervention Plan   Intervention Prescribe, educate and counsel regarding individualized specific dietary modifications aiming towards targeted core components such as weight, hypertension, lipid management, diabetes, heart failure and other comorbidities.;Nutrition handout(s) given to patient.    Expected Outcomes Long Term Goal: Adherence to prescribed nutrition plan.;Short Term Goal: A plan has been developed with personal nutrition goals set during dietitian appointment.             Nutrition Assessments:  MEDIFICTS Score Key: ?70 Need to make dietary changes  40-70 Heart Healthy Diet ? 40 Therapeutic Level Cholesterol Diet   Flowsheet Row CARDIAC REHAB PHASE II EXERCISE from 08/11/2020 in [facility]  Picture Your Plate Total Score on Admission 55      Picture Your Plate Scores: [id number] Unhealthy dietary pattern with much room for improvement. 41-50 Dietary pattern unlikely to meet recommendations for good health and room for improvement. 51-60 More healthful dietary pattern, with some room for improvement.  >60 Healthy dietary pattern, although there may be some specific behaviors that could be improved.    Nutrition Goals Re-Evaluation:  Nutrition Goals Re-Evaluation     [place] Name 08/11/20 1458 08/16/20 1123 09/09/20 1457         Goals   Current Weight 276 lb (125.2 kg) 274 lb 7.6 oz (124.5 kg) 274 lb 7.6 oz (124.5 kg)      Nutrition Goal -- Pt to identify food quantities necessary to achieve weight loss of 6-24 lb at graduation from cardiac rehab. Pt to identify food quantities necessary to achieve weight loss of 6-24 lb at graduation from cardiac rehab.           Personal Goal #2 Re-Evaluation   Personal Goal #2 -- Pt to build a healthy plate including vegetables, fruits, whole grains, and low-fat dairy products in a heart healthy meal plan. Pt to build a healthy plate including vegetables, fruits, whole grains, and low-fat dairy products in a heart healthy meal plan.           Personal Goal #3 Re-Evaluation   Personal Goal #3 -- Pt to create a meal plan Pt to create a meal plan              Nutrition Goals Re-Evaluation:  Nutrition Goals Re-Evaluation     [place] Name 08/11/20 1458 08/16/20 1123 09/09/20 1457         Goals   Current Weight 276 lb (125.2 kg) 274 lb 7.6 oz (124.5 kg) 274 lb 7.6 oz (124.5 kg)     Nutrition Goal -- Pt to identify food quantities necessary to achieve weight loss of 6-24 lb at graduation from cardiac rehab. Pt to identify food quantities necessary to achieve weight loss of 6-24 lb at graduation from cardiac rehab.           Personal Goal #2 Re-Evaluation   Personal Goal #2 -- Pt to build a healthy plate including vegetables, fruits, whole grains, and low-fat dairy products in a heart healthy meal plan. Pt to build a healthy plate including vegetables, fruits, whole grains, and low-fat dairy products in a heart healthy meal plan.           Personal Goal #3 Re-Evaluation   Personal Goal #3 -- Pt to create a meal plan Pt to create a meal plan              Nutrition Goals Discharge (Final Nutrition Goals Re-Evaluation):  Nutrition Goals Re-Evaluation - 09/09/20 1457       Goals   Current Weight 274 lb 7.6 oz (124.5 kg)    Nutrition Goal Pt to identify food quantities necessary to achieve weight loss of 6-24 lb at graduation from cardiac rehab.      Personal Goal #2  Re-Evaluation   Personal Goal #2 Pt to build a healthy plate including vegetables, fruits, whole grains, and low-fat dairy products in a heart healthy meal plan.      Personal Goal #3 Re-Evaluation   Personal Goal #3 Pt to create a meal plan             Psychosocial: Target Goals: Acknowledge presence or absence of significant depression and/or stress, maximize coping skills, provide positive support system. Participant is able to verbalize types and ability to use techniques and skills needed for reducing stress and depression.  Initial Review & Psychosocial Screening:  Initial Psych Review & Screening - 08/03/20 1424       Initial Review   Current issues with History of Depression;Current Stress Concerns    Source of Stress Concerns Chronic Illness;Unable to participate in former interests or hobbies    Comments Kevin Baxter admits to being depressed and having anxiety regarding his recent CHF diagnosis and recent hospitalization. Kevin Baxter is currently receiving couselling and is on an antidepressant and sees a psychiatrist.      Family Dynamics   Good Support System? Yes   Kevin Baxter has his wife and children for support     Barriers   Psychosocial barriers to participate in program The patient should benefit from training in stress management and relaxation.      Screening Interventions   Interventions Encouraged to exercise;Provide feedback about the scores to participant;To provide support and resources with identified psychosocial needs    Expected Outcomes Long Term Goal: Stressors or current issues are controlled or eliminated.;Short Term goal: Identification and review with participant of any Quality of Life or Depression concerns found by scoring the questionnaire.             Quality of Life Scores:  Quality of Life - 08/03/20 1423       Quality of Life   Select Quality of Life      Quality of Life Scores   Health/Function Pre 15.9 %  Socioeconomic Pre 23.14 %     Psych/Spiritual Pre 13.79 %    Family Pre 20.4 %    GLOBAL Pre 17.62 %            Scores of 19 and below usually indicate a poorer quality of life in these areas.  A difference of  2-3 points is a clinically meaningful difference.  A difference of 2-3 points in the total score of the Quality of Life Index has been associated with significant improvement in overall quality of life, self-image, physical symptoms, and general health in studies assessing change in quality of life.  PHQ-9: Recent Review Flowsheet Data     Depression screen [facility] 2/9 08/17/2020 08/17/2020 08/03/2020 03/02/2020 03/02/2020   Decreased Interest 0 0 0 3 0   Down, Depressed, Hopeless 0 0 1 3 0   PHQ - 2 Score 0 0 1 6 0   Altered sleeping 0 - - 3 -   Tired, decreased energy 0 - - 3 -   Change in appetite 0 - - 3 -   Feeling bad or failure about yourself  0 - - 3 -   Trouble concentrating 0 - - 3 -   Moving slowly or fidgety/restless 0 - - 2 -   Suicidal thoughts 0 - - 0 -   PHQ-9 Score 0 - - 23 -   Difficult doing work/chores Not difficult at all - - Somewhat difficult -      Interpretation of Total Score  Total Score Depression Severity:  1-4 = Minimal depression, 5-9 = Mild depression, 10-14 = Moderate depression, 15-19 = Moderately severe depression, 20-27 = Severe depression   Psychosocial Evaluation and Intervention:  Psychosocial Evaluation - 08/09/20 1445       Psychosocial Evaluation & Interventions   Interventions Stress management education;Relaxation education;Encouraged to exercise with the program and follow exercise prescription    Comments Patient quality of life slightly altered by physical constraints which limits ability to perform as prior to recent cardiac illness. Per pt he always thought that he was healthy with no areas health wise for concern. This event has taken him by complete surprise and he admits that he feels anxious on a day to day basis because he wonders if he will have another  event and will he survive. Advised pt to talk with his HF MD regarding resuming sex. Pt has supportive family which includes his wife and boys.  Offered emotional support and reassurance.    Expected Outcomes Kevin Baxter will display positive outlook and report less anxiety along with   healthy coping skills    Continue Psychosocial Services  Follow up required by staff             Psychosocial Re-Evaluation:  Psychosocial Re-Evaluation     [place] Name 08/18/20 1223 09/09/20 1552           Psychosocial Re-Evaluation   Current issues with Current Stress Concerns;History of Depression Current Stress Concerns;History of Depression      Comments Kevin Baxter has not voiced any increased depression or stress concerns since participating in phase 2 cardiac rehab. Kevin Baxter of life reviewed on 08/09/20 Kevin Baxter returned to work on 09/06/20 and did admit some job related stress as he works remotely      Expected Outcomes Kevin Baxter will have decreased stressors/ depression upon completion of phase 2 cardiac rehab. Kevin Baxter will have decreased stressors/ depression upon completion of phase 2 cardiac rehab.      Interventions Stress management education;Encouraged  to attend Cardiac Rehabilitation for the exercise Stress management education;Encouraged to attend Cardiac Rehabilitation for the exercise      Continue Psychosocial Services  Follow up required by staff Follow up required by staff             Initial Review   Source of Stress Concerns Chronic Illness;Unable to participate in former interests or hobbies;Unable to perform yard/household activities;Poor Coping Skills Chronic Illness;Unable to participate in former interests or hobbies;Unable to perform yard/household activities;Poor Coping Skills      Comments Will continue to monitor and offer support as needed Will continue to monitor and offer support as needed               Psychosocial Discharge (Final Psychosocial Re-Evaluation):  Psychosocial  Re-Evaluation - 09/09/20 1552       Psychosocial Re-Evaluation   Current issues with Current Stress Concerns;History of Depression    Comments Kevin Baxter returned to work on 09/06/20 and did admit some job related stress as he works remotely    Expected Outcomes Kevin Baxter will have decreased stressors/ depression upon completion of phase 2 cardiac rehab.    Interventions Stress management education;Encouraged to attend Cardiac Rehabilitation for the exercise    Continue Psychosocial Services  Follow up required by staff      Initial Review   Source of Stress Concerns Chronic Illness;Unable to participate in former interests or hobbies;Unable to perform yard/household activities;Poor Coping Skills    Comments Will continue to monitor and offer support as needed             Vocational Rehabilitation: Provide vocational rehab assistance to qualifying candidates.   Vocational Rehab Evaluation & Intervention:  Vocational Rehab - 08/03/20 1430       Initial Vocational Rehab Evaluation & Intervention   Assessment shows need for Vocational Rehabilitation No   Kevin Baxter is on short term disabity from his job and does not need vocational rehab at this time.            Education: Education Goals: Education classes will be provided on a weekly basis, covering required topics. Participant will state understanding/return demonstration of topics presented.  Learning Barriers/Preferences:  Learning Barriers/Preferences - 08/03/20 1632       Learning Barriers/Preferences   Learning Barriers Sight   reading glasses   Learning Preferences Pictoral;Skilled Demonstration;Video             Education Topics: Count Your Pulse:  -Group instruction provided by verbal instruction, demonstration, patient participation and written materials to support subject.  Instructors address importance of being able to find your pulse and how to count your pulse when at home without a heart monitor.  Patients get  hands on experience counting their pulse with staff help and individually.   Heart Attack, Angina, and Risk Factor Modification:  -Group instruction provided by verbal instruction, video, and written materials to support subject.  Instructors address signs and symptoms of angina and heart attacks.    Also discuss risk factors for heart disease and how to make changes to improve heart health risk factors.   Functional Fitness:  -Group instruction provided by verbal instruction, demonstration, patient participation, and written materials to support subject.  Instructors address safety measures for doing things around the house.  Discuss how to get up and down off the floor, how to pick things up properly, how to safely get out of a chair without assistance, and balance training.   Meditation and Mindfulness:  -Group instruction provided by verbal  instruction, patient participation, and written materials to support subject.  Instructor addresses importance of mindfulness and meditation practice to help reduce stress and improve awareness.  Instructor also leads participants through a meditation exercise.    Stretching for Flexibility and Mobility:  -Group instruction provided by verbal instruction, patient participation, and written materials to support subject.  Instructors lead participants through series of stretches that are designed to increase flexibility thus improving mobility.  These stretches are additional exercise for major muscle groups that are typically performed during regular warm up and cool down.   Hands Only CPR:  -Group verbal, video, and participation provides a basic overview of AHA guidelines for community CPR. Role-play of emergencies allow participants the opportunity to practice calling for help and chest compression technique with discussion of AED use.   Hypertension: -Group verbal and written instruction that provides a basic overview of hypertension including the  most recent diagnostic guidelines, risk factor reduction with self-care instructions and medication management.    Nutrition I class: Heart Healthy Eating:  -Group instruction provided by PowerPoint slides, verbal discussion, and written materials to support subject matter. The instructor gives an explanation and review of the Therapeutic Lifestyle Changes diet recommendations, which includes a discussion on lipid goals, dietary fat, sodium, fiber, plant stanol/sterol esters, sugar, and the components of a well-balanced, healthy diet.   Nutrition II class: Lifestyle Skills:  -Group instruction provided by PowerPoint slides, verbal discussion, and written materials to support subject matter. The instructor gives an explanation and review of label reading, grocery shopping for heart health, heart healthy recipe modifications, and ways to make healthier choices when eating out.   Diabetes Question & Answer:  -Group instruction provided by PowerPoint slides, verbal discussion, and written materials to support subject matter. The instructor gives an explanation and review of diabetes co-morbidities, pre- and post-prandial blood glucose goals, pre-exercise blood glucose goals, signs, symptoms, and treatment of hypoglycemia and hyperglycemia, and foot care basics.   Diabetes Blitz:  -Group instruction provided by PowerPoint slides, verbal discussion, and written materials to support subject matter. The instructor gives an explanation and review of the physiology behind type 1 and type 2 diabetes, diabetes medications and rational behind using different medications, pre- and post-prandial blood glucose recommendations and Hemoglobin A1c goals, diabetes diet, and exercise including blood glucose guidelines for exercising safely.    Portion Distortion:  -Group instruction provided by PowerPoint slides, verbal discussion, written materials, and food models to support subject matter. The instructor gives an  explanation of serving size versus portion size, changes in portions sizes over the last 20 years, and what consists of a serving from each food group.   Stress Management:  -Group instruction provided by verbal instruction, video, and written materials to support subject matter.  Instructors review role of stress in heart disease and how to cope with stress positively.     Exercising on Your Own:  -Group instruction provided by verbal instruction, power point, and written materials to support subject.  Instructors discuss benefits of exercise, components of exercise, frequency and intensity of exercise, and end points for exercise.  Also discuss use of nitroglycerin and activating EMS.  Review options of places to exercise outside of rehab.  Review guidelines for sex with heart disease.   Cardiac Drugs I:  -Group instruction provided by verbal instruction and written materials to support subject.  Instructor reviews cardiac drug classes: antiplatelets, anticoagulants, beta blockers, and statins.  Instructor discusses reasons, side effects, and lifestyle considerations for each  drug class.   Cardiac Drugs II:  -Group instruction provided by verbal instruction and written materials to support subject.  Instructor reviews cardiac drug classes: angiotensin converting enzyme inhibitors (ACE-I), angiotensin II receptor blockers (ARBs), nitrates, and calcium channel blockers.  Instructor discusses reasons, side effects, and lifestyle considerations for each drug class.   Anatomy and Physiology of the Circulatory System:  Group verbal and written instruction and models provide basic cardiac anatomy and physiology, with the coronary electrical and arterial systems. Review of: AMI, Angina, Valve disease, Heart Failure, Peripheral Artery Disease, Cardiac Arrhythmia, Pacemakers, and the ICD.   Other Education:  -Group or individual verbal, written, or video instructions that support the educational  goals of the cardiac rehab program.   Holiday Eating Survival Tips:  -Group instruction provided by PowerPoint slides, verbal discussion, and written materials to support subject matter. The instructor gives patients tips, tricks, and techniques to help them not only survive but enjoy the holidays despite the onslaught of food that accompanies the holidays.   Knowledge Questionnaire Score:  Knowledge Questionnaire Score - 08/03/20 1416       Knowledge Questionnaire Score   Pre Score 23/24             Core Components/Risk Factors/Patient Goals at Admission:  Personal Goals and Risk Factors at Admission - 08/03/20 1330       Core Components/Risk Factors/Patient Goals on Admission    Weight Management Yes;Obesity    Intervention Weight Management/Obesity: Establish reasonable short term and long term weight goals.;Obesity: Provide education and appropriate resources to help participant work on and attain dietary goals.    Admit Weight 276 lb 3.8 oz (125.3 kg)    Goal Weight: Short Term 268 lb (121.6 kg)    Goal Weight: Long Term 235 lb (106.6 kg)    Expected Outcomes Short Term: Continue to assess and modify interventions until short term weight is achieved;Long Term: Adherence to nutrition and physical activity/exercise program aimed toward attainment of established weight goal;Weight Loss: Understanding of general recommendations for a balanced deficit meal plan, which promotes 1-2 lb weight loss per week and includes a negative energy balance of [phone] kcal/d    Heart Failure Yes    Intervention Provide a combined exercise and nutrition program that is supplemented with education, support and counseling about heart failure. Directed toward relieving symptoms such as shortness of breath, decreased exercise tolerance, and extremity edema.    Expected Outcomes Improve functional capacity of life;Short term: Attendance in program 2-3 days a week with increased exercise capacity.  Reported lower sodium intake. Reported increased fruit and vegetable intake. Reports medication compliance.;Short term: Daily weights obtained and reported for increase. Utilizing diuretic protocols set by physician.;Long term: Adoption of self-care skills and reduction of barriers for early signs and symptoms recognition and intervention leading to self-care maintenance.    Hypertension Yes    Intervention Provide education on lifestyle modifcations including regular physical activity/exercise, weight management, moderate sodium restriction and increased consumption of fresh fruit, vegetables, and low fat dairy, alcohol moderation, and smoking cessation.;Monitor prescription use compliance.    Expected Outcomes Short Term: Continued assessment and intervention until BP is < 140/50m HG in hypertensive participants. < 130/83mHG in hypertensive participants with diabetes, heart failure or chronic kidney disease.;Long Term: Maintenance of blood pressure at goal levels.    Stress Yes    Intervention Offer individual and/or small group education and counseling on adjustment to heart disease, stress management and health-related lifestyle change. Teach and support  self-help strategies.;Refer participants experiencing significant psychosocial distress to appropriate mental health specialists for further evaluation and treatment. When possible, include family members and significant others in education/counseling sessions.    Expected Outcomes Short Term: Participant demonstrates changes in health-related behavior, relaxation and other stress management skills, ability to obtain effective social support, and compliance with psychotropic medications if prescribed.;Long Term: Emotional wellbeing is indicated by absence of clinically significant psychosocial distress or social isolation.             Core Components/Risk Factors/Patient Goals Review:   Goals and Risk Factor Review     Row Name 08/18/20 1227  09/09/20 1553           Core Components/Risk Factors/Patient Goals Review   Personal Goals Review Weight Management/Obesity;Heart Failure;Hypertension;Stress Weight Management/Obesity;Heart Failure;Hypertension;Stress      Review Kevin Baxter is off to a great start to exercise at cardiac rehab.  vital signs have been stable.Kevin Baxter has lost 2.0 kg since starting CR Kevin Baxter continues to do well with exercise. Kevin Baxter's vital signs have been stable.      Expected Outcomes Kevin Baxter will continue to partcipate in phase 2 cardiac rehab for exercise, nutrtion and life style modifications Kevin Baxter will continue to partcipate in phase 2 cardiac rehab for exercise, nutrtion and life style modifications               Core Components/Risk Factors/Patient Goals at Discharge (Final Review):   Goals and Risk Factor Review - 09/09/20 1553       Core Components/Risk Factors/Patient Goals Review   Personal Goals Review Weight Management/Obesity;Heart Failure;Hypertension;Stress    Review Kevin Baxter continues to do well with exercise. Kevin Baxter's vital signs have been stable.    Expected Outcomes Kevin Baxter will continue to partcipate in phase 2 cardiac rehab for exercise, nutrtion and life style modifications             ITP Comments:  ITP Comments     Row Name 08/03/20 1318 08/18/20 1221 09/09/20 1551       ITP Comments Medical Director- Dr. Fransico Him, MD 30 Day ITP Review. Kevin Baxter has good attendance and participation in phase 2 cardaic rehab. Kevin Baxter is off to a good start to exercise. 30 Day ITP Review. Kevin Baxter has good attendance and participation in phase 2 cardaic rehab. Kevin Baxter is doing well with exercise and weight loss.              Comments: See ITP Comments

## 2020-09-14 ENCOUNTER — Telehealth (HOSPITAL_COMMUNITY): Payer: Self-pay | Admitting: *Deleted

## 2020-09-14 NOTE — Telephone Encounter (Signed)
Pt called asking if it is ok to take medication for erectile dysfunction. If so he asked that we prescribe the medication.  Routed to [facility] for advice

## 2020-09-14 NOTE — Telephone Encounter (Signed)
Left VM requesting return call.

## 2020-09-15 ENCOUNTER — Other Ambulatory Visit: Payer: Self-pay

## 2020-09-15 ENCOUNTER — Encounter (HOSPITAL_COMMUNITY)
Admission: RE | Admit: 2020-09-15 | Discharge: 2020-09-15 | Disposition: A | Discharge status: Home / Self Care | Payer: 59 | Source: Ambulatory Visit | Attending: Internal Medicine | Admitting: Internal Medicine

## 2020-09-15 ENCOUNTER — Other Ambulatory Visit: Payer: Self-pay | Admitting: Internal Medicine

## 2020-09-15 DIAGNOSIS — I5022 Chronic systolic (congestive) heart failure: Secondary | ICD-10-CM

## 2020-09-15 DIAGNOSIS — K219 Gastro-esophageal reflux disease without esophagitis: Secondary | ICD-10-CM

## 2020-09-17 ENCOUNTER — Encounter (HOSPITAL_COMMUNITY)
Admission: RE | Admit: 2020-09-17 | Discharge: 2020-09-17 | Disposition: A | Discharge status: Home / Self Care | Payer: 59 | Source: Ambulatory Visit | Attending: Internal Medicine | Admitting: Internal Medicine

## 2020-09-17 ENCOUNTER — Other Ambulatory Visit: Payer: Self-pay

## 2020-09-17 DIAGNOSIS — I5022 Chronic systolic (congestive) heart failure: Secondary | ICD-10-CM

## 2020-09-20 ENCOUNTER — Encounter (HOSPITAL_COMMUNITY)
Admission: RE | Admit: 2020-09-20 | Discharge: 2020-09-20 | Disposition: A | Discharge status: Home / Self Care | Payer: 59 | Source: Ambulatory Visit | Attending: Internal Medicine | Admitting: Internal Medicine

## 2020-09-20 ENCOUNTER — Other Ambulatory Visit: Payer: Self-pay

## 2020-09-20 DIAGNOSIS — I5022 Chronic systolic (congestive) heart failure: Secondary | ICD-10-CM | POA: Diagnosis not present

## 2020-09-22 ENCOUNTER — Other Ambulatory Visit: Payer: Self-pay

## 2020-09-22 ENCOUNTER — Encounter (HOSPITAL_COMMUNITY)
Admission: RE | Admit: 2020-09-22 | Discharge: 2020-09-22 | Disposition: A | Discharge status: Home / Self Care | Payer: 59 | Source: Ambulatory Visit | Attending: Internal Medicine | Admitting: Internal Medicine

## 2020-09-22 DIAGNOSIS — I5022 Chronic systolic (congestive) heart failure: Secondary | ICD-10-CM

## 2020-09-22 NOTE — Progress Notes (Signed)
Discharge Progress Report  Patient Details  Name: Kevin Baxter MRN: [id number] Date of Birth: 02/06/1970 Referring Provider:   Flowsheet Row CARDIAC REHAB PHASE II ORIENTATION from 08/03/2020 in [facility]  Referring Provider Bensimhon, [name] Pascal, MD        Number of Visits: 21  Reason for Discharge:  Patient reached a stable level of exercise. Patient independent in their exercise. Patient has met program and personal goals.  Smoking History:  Social History   Tobacco Use  Smoking Status Never  Smokeless Tobacco Never    Diagnosis:  04/11/20 CHF  ADL UCSD:   Initial Exercise Prescription:  Initial Exercise Prescription - 08/03/20 1400       Date of Initial Exercise RX and Referring Provider   Date 08/03/20    Referring Provider Jolaine Artist, MD    Expected Discharge Date 10/01/20      Treadmill   MPH 2.8    Grade 0    Minutes 15    METs 3.14      T5 Nustep   Level 3    SPM 85    Minutes 15    METs 3      Prescription Details   Frequency (times per week) 3    Duration Progress to 30 minutes of continuous aerobic without signs/symptoms of physical distress      Intensity   THRR 40-80% of Max Heartrate 68-135    Ratings of Perceived Exertion 11-13    Perceived Dyspnea 0-4      Progression   Progression Continue to progress workloads to maintain intensity without signs/symptoms of physical distress.      Resistance Training   Training Prescription Yes    Weight 5 lbs    Reps 10-15             Discharge Exercise Prescription (Final Exercise Prescription Changes):  Exercise Prescription Changes - 10/01/20 1106       Response to Exercise   Blood Pressure (Admit) 130/82    Blood Pressure (Exercise) 122/78    Blood Pressure (Exit) 100/62    Heart Rate (Admit) 93 bpm    Heart Rate (Exercise) 130 bpm    Heart Rate (Exit) 93 bpm    Rating of Perceived Exertion (Exercise) 8    Symptoms none     Duration Continue with 30 min of aerobic exercise without signs/symptoms of physical distress.    Intensity THRR unchanged      Progression   Progression Continue to progress workloads to maintain intensity without signs/symptoms of physical distress.    Average METs 3.6      Resistance Training   Training Prescription Yes    Weight 7 lbs    Reps 10-15    Time 10 Minutes      Interval Training   Interval Training No      T5 Nustep   Level 6    SPM 85    Minutes 15    METs 3.7      Track   Laps 29    Minutes 20    METs 3.52      Home Exercise Plan   Plans to continue exercise at Home (comment)   Walking   Frequency Add 4 additional days to program exercise sessions.    Initial Home Exercises Provided 08/23/20             Functional Capacity:  6 Minute Walk     Row Name  08/03/20 1342 09/24/20 1105       6 Minute Walk   Phase Initial Discharge    Distance 1684 feet 2111 feet    Distance % Change -- 25.36 %    Distance Feet Change -- 427 ft    Walk Time 6 minutes 6 minutes    # of Rest Breaks 0 0    MPH 3.19 4    METS 4.24 5.13    RPE 9 9    Perceived Dyspnea  0 0    VO2 Peak 14.84 17.94    Symptoms No No    Resting HR 93 bpm 86 bpm    Resting BP 124/78 120/76    Resting Oxygen Saturation  98 % --    Exercise Oxygen Saturation  during 6 min walk 98 % --    Max Ex. HR 110 bpm 118 bpm    Max Ex. BP 142/82 148/82    2 Minute Post BP 128/82 98/52             Psychological, QOL, Others - Outcomes: PHQ 2/9: Depression screen [facility] 2/9 09/22/2020 08/17/2020 08/17/2020 08/03/2020 03/02/2020  Decreased Interest 0 0 0 0 3  Down, Depressed, Hopeless 0 0 0 1 3  PHQ - 2 Score 0 0 0 1 6  Altered sleeping - 0 - - 3  Tired, decreased energy - 0 - - 3  Change in appetite - 0 - - 3  Feeling bad or failure about yourself  - 0 - - 3  Trouble concentrating - 0 - - 3  Moving slowly or fidgety/restless - 0 - - 2  Suicidal thoughts - 0 - - 0  PHQ-9 Score - 0 - - 23   Difficult doing work/chores - Not difficult at all - - Somewhat difficult  Some encounter information is confidential and restricted. Go to Review Flowsheets activity to see all data.    Quality of Life:  Quality of Life - 10/01/20 1126       Quality of Life   Select Quality of Life      Quality of Life Scores   Health/Function Pre 15.9 %    Health/Function Post 21.3 %    Health/Function % Change 33.96 %    Socioeconomic Pre 23.14 %    Socioeconomic Post 20.29 %    Socioeconomic % Change  -12.32 %    Psych/Spiritual Pre 13.79 %    Psych/Spiritual Post 19.5 %    Psych/Spiritual % Change 41.41 %    Family Pre 20.4 %    Family Post 24 %    Family % Change 17.65 %    GLOBAL Pre 17.62 %    GLOBAL Post 21.12 %    GLOBAL % Change 19.86 %             Personal Goals: Goals established at orientation with interventions provided to work toward goal.  Personal Goals and Risk Factors at Admission - 08/03/20 1330       Core Components/Risk Factors/Patient Goals on Admission    Weight Management Yes;Obesity    Intervention Weight Management/Obesity: Establish reasonable short term and long term weight goals.;Obesity: Provide education and appropriate resources to help participant work on and attain dietary goals.    Admit Weight 276 lb 3.8 oz (125.3 kg)    Goal Weight: Short Term 268 lb (121.6 kg)    Goal Weight: Long Term 235 lb (106.6 kg)    Expected Outcomes Short Term: Continue to assess  and modify interventions until short term weight is achieved;Long Term: Adherence to nutrition and physical activity/exercise program aimed toward attainment of established weight goal;Weight Loss: Understanding of general recommendations for a balanced deficit meal plan, which promotes 1-2 lb weight loss per week and includes a negative energy balance of [phone] kcal/d    Heart Failure Yes    Intervention Provide a combined exercise and nutrition program that is supplemented with education,  support and counseling about heart failure. Directed toward relieving symptoms such as shortness of breath, decreased exercise tolerance, and extremity edema.    Expected Outcomes Improve functional capacity of life;Short term: Attendance in program 2-3 days a week with increased exercise capacity. Reported lower sodium intake. Reported increased fruit and vegetable intake. Reports medication compliance.;Short term: Daily weights obtained and reported for increase. Utilizing diuretic protocols set by physician.;Long term: Adoption of self-care skills and reduction of barriers for early signs and symptoms recognition and intervention leading to self-care maintenance.    Hypertension Yes    Intervention Provide education on lifestyle modifcations including regular physical activity/exercise, weight management, moderate sodium restriction and increased consumption of fresh fruit, vegetables, and low fat dairy, alcohol moderation, and smoking cessation.;Monitor prescription use compliance.    Expected Outcomes Short Term: Continued assessment and intervention until BP is < 140/41mm HG in hypertensive participants. < 130/21mm HG in hypertensive participants with diabetes, heart failure or chronic kidney disease.;Long Term: Maintenance of blood pressure at goal levels.    Stress Yes    Intervention Offer individual and/or small group education and counseling on adjustment to heart disease, stress management and health-related lifestyle change. Teach and support self-help strategies.;Refer participants experiencing significant psychosocial distress to appropriate mental health specialists for further evaluation and treatment. When possible, include family members and significant others in education/counseling sessions.    Expected Outcomes Short Term: Participant demonstrates changes in health-related behavior, relaxation and other stress management skills, ability to obtain effective social support, and compliance  with psychotropic medications if prescribed.;Long Term: Emotional wellbeing is indicated by absence of clinically significant psychosocial distress or social isolation.              Personal Goals Discharge:  Goals and Risk Factor Review     Row Name 08/18/20 1227 09/09/20 1553 10/01/20 1430         Core Components/Risk Factors/Patient Goals Review   Personal Goals Review Weight Management/Obesity;Heart Failure;Hypertension;Stress Weight Management/Obesity;Heart Failure;Hypertension;Stress Weight Management/Obesity;Heart Failure;Hypertension;Stress     Review Kevin Baxter is off to a great start to exercise at cardiac rehab.  vital signs have been stable.Kevin Baxter has lost 2.0 kg since starting CR Kevin Baxter continues to do well with exercise. Gerard's vital signs have been stable. Kevin Baxter graduates today with the completion of 21 exercise sessions.  Kevin Baxter has made significant progress during his 8 week participation with increased MET level  from 2.0 to 3.7.  Kevin Baxter did gain 1.4 kg (3 .08 lb) from first to last days however lower weights have been recordered throughout his town and his measurements did show decrease in inches. Kevin Baxter plans to use exercie as a way of mangaging the stress from work.  Kevin Baxter is complaint with heart failure regimen with diet, medication, daily weights  and regular office visits.     Expected Outcomes Kevin Baxter will continue to partcipate in phase 2 cardiac rehab for exercise, nutrtion and life style modifications Kevin Baxter will continue to partcipate in phase 2 cardiac rehab for exercise, nutrtion and life style modifications Kevin Baxter made significant strides toward his  goals.  He believes he will be sucessful with continued adoption of a heart heatlhy lifestyle.              Exercise Goals and Review:  Exercise Goals     Row Name 08/03/20 1340             Exercise Goals   Increase Physical Activity Yes       Intervention Provide advice, education, support and  counseling about physical activity/exercise needs.;Develop an individualized exercise prescription for aerobic and resistive training based on initial evaluation findings, risk stratification, comorbidities and participant's personal goals.       Expected Outcomes Short Term: Attend rehab on a regular basis to increase amount of physical activity.;Long Term: Exercising regularly at least 3-5 days a week.;Long Term: Add in home exercise to make exercise part of routine and to increase amount of physical activity.       Increase Strength and Stamina Yes       Intervention Provide advice, education, support and counseling about physical activity/exercise needs.;Develop an individualized exercise prescription for aerobic and resistive training based on initial evaluation findings, risk stratification, comorbidities and participant's personal goals.       Expected Outcomes Short Term: Increase workloads from initial exercise prescription for resistance, speed, and METs.;Short Term: Perform resistance training exercises routinely during rehab and add in resistance training at home;Long Term: Improve cardiorespiratory fitness, muscular endurance and strength as measured by increased METs and functional capacity (6MWT)       Able to understand and use rate of perceived exertion (RPE) scale Yes       Intervention Provide education and explanation on how to use RPE scale       Expected Outcomes Short Term: Able to use RPE daily in rehab to express subjective intensity level;Long Term:  Able to use RPE to guide intensity level when exercising independently       Knowledge and understanding of Target Heart Rate Range (THRR) Yes       Intervention Provide education and explanation of THRR including how the numbers were predicted and where they are located for reference       Expected Outcomes Short Term: Able to state/look up THRR;Long Term: Able to use THRR to govern intensity when exercising independently;Short Term:  Able to use daily as guideline for intensity in rehab       Able to check pulse independently Yes       Intervention Provide education and demonstration on how to check pulse in carotid and radial arteries.;Review the importance of being able to check your own pulse for safety during independent exercise       Expected Outcomes Short Term: Able to explain why pulse checking is important during independent exercise;Long Term: Able to check pulse independently and accurately       Understanding of Exercise Prescription Yes       Intervention Provide education, explanation, and written materials on patient's individual exercise prescription       Expected Outcomes Short Term: Able to explain program exercise prescription;Long Term: Able to explain home exercise prescription to exercise independently                Exercise Goals Re-Evaluation:  Exercise Goals Re-Evaluation     Row Name 08/09/20 1119 08/16/20 1114 08/23/20 1122 08/30/20 1203 09/13/20 1104     Exercise Goal Re-Evaluation   Exercise Goals Review Increase Physical Activity;Able to understand and use rate of perceived exertion (RPE) scale Increase  Physical Activity;Able to understand and use rate of perceived exertion (RPE) scale Increase Physical Activity;Able to understand and use rate of perceived exertion (RPE) scale;Understanding of Exercise Prescription;Increase Strength and Stamina;Knowledge and understanding of Target Heart Rate Range (THRR);Able to check pulse independently Increase Physical Activity;Able to understand and use rate of perceived exertion (RPE) scale;Understanding of Exercise Prescription;Increase Strength and Stamina;Knowledge and understanding of Target Heart Rate Range (THRR);Able to check pulse independently Increase Physical Activity;Able to understand and use rate of perceived exertion (RPE) scale;Understanding of Exercise Prescription;Increase Strength and Stamina;Knowledge and understanding of Target Heart  Rate Range (THRR);Able to check pulse independently   Comments Patient able to understand and use RPE scale appropriately. Reviewed THRR, METs, and RPE scale with patient. Patient is walking at home in addition to exercise at cardiac rehab. Increased wrokloads today. Reviewed home exercise prescription with patient. Patient is walking 35 minutes, 4 days week.Patient started back riding his NordicTrack bike yesterday and tolerated well. Patient has weights at home but wants to wants to wait until he gets more comfortable before he starts weights at home. Increased from 5 lb to 6 lb weights today at CR and tolerated well. Patient would like to walk the track instead of the treadmill some days, and I reassured him that he can do either the treadmill or the track for his walking station. Patient has a smart watch to check his pulse. Patient is progressing well with exercise. Patient is walking the track and using the NuStep machine for his aerobic exercise prescription at cardiac rehab. Patient states he feels good and is progressing well with exercise. Patient is walking on the days he doesn't attend cardiac rehab and asked if he can ride his Nordic bike at home for ~20 minutes on the days he attends cardiac rehab. I informed patient riding his bike and additional 20 minutes would be fine. Patient asked how long he should wait after eating to ride his bike, and I advised him to wait at least 30 minutes after eating.   Expected Outcomes Progress workloads as tolerated to help achieve personal health and fitness goals. Continue to progress workloads as tolerated to help increase cardiorespiratory fitness. Progress workloads as tolerated to help gain confidence and comfort with exercise routine. Continue current exercise prescription. Patient will add 20 minutes of aerobic exercise, riding his stationary bike at least 3 days/week to help achieve personal health and fitness goals.    Jeddito Name 09/24/20 1115 09/29/20  1124 10/01/20 1148         Exercise Goal Re-Evaluation   Exercise Goals Review Increase Physical Activity;Able to understand and use rate of perceived exertion (RPE) scale;Understanding of Exercise Prescription;Increase Strength and Stamina;Knowledge and understanding of Target Heart Rate Range (THRR);Able to check pulse independently Increase Physical Activity;Able to understand and use rate of perceived exertion (RPE) scale;Understanding of Exercise Prescription;Increase Strength and Stamina;Knowledge and understanding of Target Heart Rate Range (THRR);Able to check pulse independently Increase Physical Activity;Able to understand and use rate of perceived exertion (RPE) scale;Understanding of Exercise Prescription;Increase Strength and Stamina;Knowledge and understanding of Target Heart Rate Range (THRR);Able to check pulse independently     Comments Patient scheduled to complete the cardiac rehab program next week. Patient's functional capacity increased 25% as measured by 6MWT. Patient feels that his strength is much better now than before he started the cardiac rehab program. Patient feels that he's benefitted from participation in the cardiac rehab. Patient plans to continue walking and riding his stationary bike at least 4  days/week. Patient completed the cardiac rehab program today and progressed well achieving 3.6 METs with exercise. Patient plans to continue walking outside or riding his stationary bike as his mode of exercise. Patient plans to exercise 35-45 minutes at least 5 days/week. Patient also has15 and 25 lb weights at home that he plans to use for his resistance training.     Expected Outcomes Patient will continue to progress workloads at cardiac rehab and with home exericse routine to help accomplish his health and fitness goals. Patient will continue to exercise at least 30 minutes, 4-7 days/week. Patient will exercise walking and/or riding his stationary bike 35-45 minutes, at least 5  days/week to maintain health and fitness gains.              Nutrition & Weight - Outcomes:  Pre Biometrics - 08/03/20 1318       Pre Biometrics   Waist Circumference 49 inches    Hip Circumference 51 inches    Waist to Hip Ratio 0.96 %    Triceps Skinfold 31 mm    % Body Fat 37.1 %    Grip Strength 52.5 kg    Flexibility 14.63 in    Single Leg Stand 30 seconds             Post Biometrics - 10/01/20 1103        Post  Biometrics   Height 6' 0.5" (1.842 m)    Waist Circumference 49 inches    Hip Circumference 50 inches    Waist to Hip Ratio 0.98 %    BMI (Calculated) 36.55    Triceps Skinfold 18 mm    % Body Fat 34.7 %    Grip Strength 48 kg   *Different dynamometer used.   Flexibility 15.25 in    Single Leg Stand 30 seconds             Nutrition:  Nutrition Therapy & Goals - 08/11/20 1458       Nutrition Therapy   Diet TLC      Personal Nutrition Goals   Nutrition Goal Pt to identify food quantities necessary to achieve weight loss of 6-24 lb at graduation from cardiac rehab.    Personal Goal #2 Pt to build a healthy plate including vegetables, fruits, whole grains, and low-fat dairy products in a heart healthy meal plan.    Personal Goal #3 Pt to create a meal plan      Intervention Plan   Intervention Prescribe, educate and counsel regarding individualized specific dietary modifications aiming towards targeted core components such as weight, hypertension, lipid management, diabetes, heart failure and other comorbidities.;Nutrition handout(s) given to patient.    Expected Outcomes Long Term Goal: Adherence to prescribed nutrition plan.;Short Term Goal: A plan has been developed with personal nutrition goals set during dietitian appointment.             Nutrition Discharge:   Education Questionnaire Score:  Knowledge Questionnaire Score - 10/01/20 1127       Knowledge Questionnaire Score   Pre Score 23/24    Post Score 24/24              Goals reviewed with patient; copy given to patient.Pt graduates from cardiac rehab program next Friday with completion of  exercise sessions in Phase II. Pt maintained good attendance and progressed nicely during his participation in rehab as evidenced by increased MET level.   Medication list reconciled. Repeat  PHQ score- 0 .  Pt has made  significant lifestyle changes and should be commended for his success. Pt feels he has achieved his goals during cardiac rehab.   Pt plans to continue exercise by walking and riding his stationary bike 4-5 days a week.In addition to using hand weights at home.Kevin Baxter says he plans to watch his diet and stay away from salty foods. Kevin Baxter also admits to having some job stress and may consider moving to a different department. We are proud of Gerard's progress! Berneta Sages increased his distance on his post exercise walk test by 427 feet! Barnet Pall, RN,BSN 10/05/2020 4:21 PM

## 2020-09-24 ENCOUNTER — Encounter (HOSPITAL_COMMUNITY)
Admission: RE | Admit: 2020-09-24 | Discharge: 2020-09-24 | Disposition: A | Discharge status: Home / Self Care | Payer: 59 | Source: Ambulatory Visit | Attending: Internal Medicine | Admitting: Internal Medicine

## 2020-09-24 ENCOUNTER — Other Ambulatory Visit: Payer: Self-pay

## 2020-09-24 DIAGNOSIS — I5022 Chronic systolic (congestive) heart failure: Secondary | ICD-10-CM

## 2020-09-27 ENCOUNTER — Other Ambulatory Visit: Payer: Self-pay

## 2020-09-27 ENCOUNTER — Encounter (HOSPITAL_COMMUNITY)
Admission: RE | Admit: 2020-09-27 | Discharge: 2020-09-27 | Disposition: A | Discharge status: Home / Self Care | Payer: 59 | Source: Ambulatory Visit | Attending: Internal Medicine | Admitting: Internal Medicine

## 2020-09-27 DIAGNOSIS — I5022 Chronic systolic (congestive) heart failure: Secondary | ICD-10-CM

## 2020-09-29 ENCOUNTER — Other Ambulatory Visit: Payer: Self-pay

## 2020-09-29 ENCOUNTER — Encounter (HOSPITAL_COMMUNITY)
Admission: RE | Admit: 2020-09-29 | Discharge: 2020-09-29 | Disposition: A | Discharge status: Home / Self Care | Payer: 59 | Source: Ambulatory Visit | Attending: Internal Medicine | Admitting: Internal Medicine

## 2020-09-29 DIAGNOSIS — I5022 Chronic systolic (congestive) heart failure: Secondary | ICD-10-CM | POA: Diagnosis not present

## 2020-09-30 ENCOUNTER — Other Ambulatory Visit: Payer: Self-pay | Admitting: Physician Assistant

## 2020-10-01 ENCOUNTER — Other Ambulatory Visit: Payer: Self-pay

## 2020-10-01 ENCOUNTER — Encounter (HOSPITAL_COMMUNITY)
Admission: RE | Admit: 2020-10-01 | Discharge: 2020-10-01 | Disposition: A | Discharge status: Home / Self Care | Payer: 59 | Source: Ambulatory Visit | Attending: Internal Medicine | Admitting: Internal Medicine

## 2020-10-01 VITALS — BP 130/82 | HR 93 | Ht 72.5 in | Wt 273.4 lb

## 2020-10-01 DIAGNOSIS — I5022 Chronic systolic (congestive) heart failure: Secondary | ICD-10-CM | POA: Insufficient documentation

## 2020-10-23 ENCOUNTER — Other Ambulatory Visit: Payer: Self-pay | Admitting: Physician Assistant

## 2020-11-09 ENCOUNTER — Encounter (HOSPITAL_COMMUNITY): Payer: Self-pay | Admitting: *Deleted

## 2020-11-09 NOTE — Progress Notes (Signed)
Pt's work accomodation form completed with no strenuous, stressful work/situations, work only with [employer], signed by Dr Haroldine Laws and faxed back to [place] at

## 2020-11-16 ENCOUNTER — Encounter (HOSPITAL_COMMUNITY): Payer: Self-pay | Admitting: Psychiatry

## 2020-11-16 ENCOUNTER — Ambulatory Visit (INDEPENDENT_AMBULATORY_CARE_PROVIDER_SITE_OTHER): Payer: 59 | Admitting: Psychiatry

## 2020-11-16 DIAGNOSIS — F064 Anxiety disorder due to known physiological condition: Secondary | ICD-10-CM

## 2020-11-16 DIAGNOSIS — F4323 Adjustment disorder with mixed anxiety and depressed mood: Secondary | ICD-10-CM

## 2020-11-16 MED ORDER — SERTRALINE HCL 50 MG PO TABS: 50.0000 mg | ORAL_TABLET | Freq: Every day | ORAL | 0 refills | Status: DC

## 2020-11-16 NOTE — Progress Notes (Signed)
[place] Follow up visit    Patient Identification: Kevin Baxter MRN:  [id number] Date of Evaluation:  11/16/2020 Referral Source: primary care Chief Complaint:   follow up anxiety Visit Diagnosis:    ICD-10-CM   1. Adjustment disorder with mixed anxiety and depressed mood  F43.23     2. Anxiety disorder due to known physiological condition  F06.4           History of Present Illness:   Patient is a 50 years old currently married African-American male  Initially referred by primary care physician or therapist to establish care for anxiety.  He works full-time as an Administrator at [employer] but currently he is on disability because of his current medical comorbidity  Patient was diagnosed with congestive heart failure  during the hospital stay he developed lung clots.  Surgery was done to take care of it.  After that he developed fear of dying, that led to his referral  He is doing reasonable.  He is stressed about his job but may change within the company.  Sertraline has been helping his depression and anxiety and fear He also does cycling or walking to keep himself healthy   He has good support from wife and family   Aggravating factor: medical co morbidity, diagnosed with C[facility] and blood clots while in hospital  Modifying factor: Family  Duration since march 2022  Past Psychiatric History: denies prior to current medical condition  Previous Psychotropic Medications: Yes   Substance Abuse History in the last 12 months:  No.  Consequences of Substance Abuse: NA  Past Medical History:  Past Medical History:  Diagnosis Date   Allergy    Anxiety    Arthritis    CHF (congestive heart failure) ([place])    Fundic gland polyposis of stomach    GERD with esophagitis    Gout    Hx of adenomatous colonic polyps    Hypertension     Past Surgical History:  Procedure Laterality Date   COLONOSCOPY  05/2019   ESOPHAGOGASTRODUODENOSCOPY  05/2019   HAND SURGERY Right    2006 for a  FX   IR ANGIOGRAM PULMONARY BILATERAL SELECTIVE  04/16/2020   IR ANGIOGRAM SELECTIVE EACH ADDITIONAL VESSEL  04/16/2020   IR ANGIOGRAM SELECTIVE EACH ADDITIONAL VESSEL  04/16/2020   IR THROMBECT PRIM MECH INIT (INCLU) MOD SED  04/16/2020   IR US GUIDE VASC ACCESS LEFT  04/16/2020   IR US GUIDE VASC ACCESS LEFT  04/16/2020   IR US GUIDE VASC ACCESS RIGHT  04/16/2020   RADIOLOGY WITH ANESTHESIA N/A 04/16/2020   Procedure: IR WITH ANESTHESIA- PULMONARY EMBOLUS INTERVENTION;  Surgeon: Suzette Battiest, MD;  Location: [facility];  Service: Radiology;  Laterality: N/A;   RIGHT/LEFT HEART CATH AND CORONARY ANGIOGRAPHY N/A 04/15/2020   Procedure: RIGHT/LEFT HEART CATH AND CORONARY ANGIOGRAPHY;  Surgeon: Jolaine Artist, MD;  Location: [facility];  Service: Cardiovascular;  Laterality: N/A;    Family Psychiatric History: Nephew: schizophrenia  Family History:  Family History  Problem Relation Age of Onset   Cancer Mother        CERVICAL   Hypertension Father    Heart failure Father        no MI, d/t etoh   Diabetes Sister    Diabetes Sister    Diabetes Paternal Aunt    Diabetes Cousin    Colon cancer Neg Hx    Prostate cancer Neg Hx    Stroke Neg Hx  Esophageal cancer Neg Hx    Rectal cancer Neg Hx    Stomach cancer Neg Hx     Social History:   Social History   Socioeconomic History   Marital status: Married    Spouse name: Not on file   Number of children: 2   Years of education: Not on file   Highest education level: Some college, no degree  Occupational History   Occupation: works for [employer]  Tobacco Use   Smoking status: Never   Smokeless tobacco: Never  Vaping Use   Vaping Use: Never used  Substance and Sexual Activity   Alcohol use: Yes    Comment: very rare    Drug use: No   Sexual activity: Not on file  Other Topics Concern   Not on file  Social History Narrative   Household: pt, wife daughter (2013),  boy  (2006),    Social Determinants of Adult nurse Strain: Not on file  Food Insecurity: Not on file  Transportation Needs: Not on file  Physical Activity: Not on file  Stress: Not on file  Social Connections: Not on file     Allergies:   Allergies  Allergen Reactions   Pseudoephedrine     REACTION: jittery    Metabolic Disorder Labs: Lab Results  Component Value Date   HGBA1C 5.6 08/17/2020   MPG 134.11 04/11/2020   No results found for: PROLACTIN Lab Results  Component Value Date   CHOL 212 (H) 08/17/2020   TRIG 123.0 08/17/2020   HDL 63.30 08/17/2020   CHOLHDL 3 08/17/2020   VLDL 24.6 08/17/2020   LDLCALC 124 (H) 08/17/2020   LDLCALC 108 (H) 12/03/2018   Lab Results  Component Value Date   TSH 2.246 04/12/2020    Therapeutic Level Labs: No results found for: LITHIUM No results found for: CBMZ No results found for: VALPROATE  Current Medications: Current Outpatient Medications  Medication Sig Dispense Refill   allopurinol (ZYLOPRIM) 300 MG tablet Take 1 tablet (300 mg total) by mouth daily. 90 tablet 3   apixaban (ELIQUIS) 5 MG TABS tablet Take 1 tablet (5 mg total) by mouth 2 (two) times daily. 180 tablet 1   carvedilol (COREG) 3.125 MG tablet Take 1 tablet (3.125 mg total) by mouth 2 (two) times daily with a meal. 60 tablet 6   empagliflozin (JARDIANCE) 10 MG TABS tablet Take 1 tablet (10 mg total) by mouth daily. 30 tablet 6   ENTRESTO 24-26 MG TAKE 1 TABLET BY MOUTH TWICE A DAY 60 tablet 6   ivabradine (CORLANOR) 5 MG TABS tablet Take 1 tablet (5 mg total) by mouth 2 (two) times daily with a meal. 180 tablet 1   ondansetron (ZOFRAN ODT) 4 MG disintegrating tablet Take 1 tablet (4 mg total) by mouth every 8 (eight) hours as needed for nausea or vomiting. 20 tablet 0   pantoprazole (PROTONIX) 40 MG tablet TAKE 1 TABLET BY MOUTH EVERY DAY 90 tablet 1   sertraline (ZOLOFT) 50 MG tablet Take 1 tablet (50 mg total) by mouth daily. 90 tablet 0   spironolactone (ALDACTONE) 25 MG tablet Take  0.5 tablets (12.5 mg total) by mouth daily. 30 tablet 6   sucralfate (CARAFATE) 1 g tablet TAKE 1 TABLET (1 G TOTAL) BY MOUTH 4 (FOUR) TIMES DAILY - WITH MEALS AND AT BEDTIME. 120 tablet 0   No current facility-administered medications for this visit.     Psychiatric Specialty Exam: Review of Systems  Cardiovascular:  Negative for chest pain.   There were no vitals taken for this visit.There is no height or weight on file to calculate BMI.  General Appearance: Casual  Eye Contact:  Fair  Speech:  Normal Rate  Volume:  Normal  Mood:  Euthymic  Affect:  Congruent  Thought Process:  Goal Directed  Orientation:  Full (Time, Place, and Person)  Thought Content:  Rumination  Suicidal Thoughts:  No  Homicidal Thoughts:  No  Memory:  Immediate;   Fair Recent;   Fair  Judgement:  Fair  Insight:  Fair  Psychomotor Activity:  Normal  Concentration:  Concentration: Fair and Attention Span: Fair  Recall:  Good  Fund of Knowledge:Good  Language: Fair  Akathisia:  No  Handed:  AIMS (if indicated):  not done  Assets:  Communication Skills Desire for Improvement  ADL's:  Intact  Cognition: WNL  Sleep:   variable   Screenings: PHQ2-9    Flowsheet Row CARDIAC REHAB PHASE II EXERCISE from 09/22/2020 in [facility] Office Visit from 08/17/2020 in [facility] from 08/03/2020 in [facility] Video Visit from 05/17/2020 in [facility] Counselor from 05/04/2020 in [facility]  PHQ-2 Total Score 0 0 1 1 2   PHQ-9 Total Score -- 0 -- 6 10      [place] Visit from 11/16/2020 in [facility] Video Visit from 08/12/2020 in [facility] Video Visit from 06/14/2020 in [facility] No Risk No Risk No Risk       Assessment and Plan: Prior documentation reviewed Adjustment disorder with depressed mood and anxiety:  Improved continue sertraline 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

## 2020-12-10 ENCOUNTER — Encounter (HOSPITAL_COMMUNITY): Payer: Self-pay | Admitting: Internal Medicine

## 2020-12-10 ENCOUNTER — Ambulatory Visit (HOSPITAL_BASED_OUTPATIENT_CLINIC_OR_DEPARTMENT_OTHER)
Admission: RE | Admit: 2020-12-10 | Discharge: 2020-12-10 | Disposition: A | Discharge status: Home / Self Care | Payer: 59 | Source: Ambulatory Visit

## 2020-12-10 ENCOUNTER — Ambulatory Visit (HOSPITAL_COMMUNITY)
Admission: RE | Admit: 2020-12-10 | Discharge: 2020-12-10 | Disposition: A | Discharge status: Home / Self Care | Payer: 59 | Source: Ambulatory Visit | Attending: Internal Medicine | Admitting: Internal Medicine

## 2020-12-10 VITALS — BP 140/80 | HR 75 | Wt 287.0 lb

## 2020-12-10 DIAGNOSIS — Z79899 Other long term (current) drug therapy: Secondary | ICD-10-CM | POA: Diagnosis not present

## 2020-12-10 DIAGNOSIS — I513 Intracardiac thrombosis, not elsewhere classified: Secondary | ICD-10-CM | POA: Diagnosis not present

## 2020-12-10 DIAGNOSIS — I5020 Unspecified systolic (congestive) heart failure: Secondary | ICD-10-CM | POA: Insufficient documentation

## 2020-12-10 DIAGNOSIS — I5022 Chronic systolic (congestive) heart failure: Secondary | ICD-10-CM

## 2020-12-10 DIAGNOSIS — I5082 Biventricular heart failure: Secondary | ICD-10-CM | POA: Diagnosis not present

## 2020-12-10 DIAGNOSIS — I2699 Other pulmonary embolism without acute cor pulmonale: Secondary | ICD-10-CM | POA: Insufficient documentation

## 2020-12-10 DIAGNOSIS — I110 Hypertensive heart disease with heart failure: Secondary | ICD-10-CM | POA: Diagnosis not present

## 2020-12-10 DIAGNOSIS — I2609 Other pulmonary embolism with acute cor pulmonale: Secondary | ICD-10-CM | POA: Diagnosis not present

## 2020-12-10 DIAGNOSIS — I509 Heart failure, unspecified: Secondary | ICD-10-CM

## 2020-12-10 DIAGNOSIS — Z7984 Long term (current) use of oral hypoglycemic drugs: Secondary | ICD-10-CM | POA: Diagnosis not present

## 2020-12-10 DIAGNOSIS — Z7901 Long term (current) use of anticoagulants: Secondary | ICD-10-CM | POA: Diagnosis not present

## 2020-12-10 DIAGNOSIS — I82411 Acute embolism and thrombosis of right femoral vein: Secondary | ICD-10-CM | POA: Insufficient documentation

## 2020-12-10 DIAGNOSIS — K859 Acute pancreatitis without necrosis or infection, unspecified: Secondary | ICD-10-CM | POA: Diagnosis not present

## 2020-12-10 LAB — CBC
HCT: 58.7 % — ABNORMAL HIGH (ref 39.0–52.0)
Hemoglobin: 18.2 g/dL — ABNORMAL HIGH (ref 13.0–17.0)
MCH: 25 pg — ABNORMAL LOW (ref 26.0–34.0)
MCHC: 31 g/dL (ref 30.0–36.0)
MCV: 80.6 fL (ref 80.0–100.0)
Platelets: 228 10*3/uL (ref 150–400)
RBC: 7.28 MIL/uL — ABNORMAL HIGH (ref 4.22–5.81)
RDW: 18.6 % — ABNORMAL HIGH (ref 11.5–15.5)
WBC: 4.9 10*3/uL (ref 4.0–10.5)
nRBC: 0 % (ref 0.0–0.2)

## 2020-12-10 LAB — ECHOCARDIOGRAM COMPLETE
Area-P 1/2: 2.62 cm2
Calc EF: 44.8 %
S' Lateral: 4.7 cm
Single Plane A2C EF: 46.4 %
Single Plane A4C EF: 45 %

## 2020-12-10 LAB — COMPREHENSIVE METABOLIC PANEL
ALT: 19 U/L (ref 0–44)
AST: 17 U/L (ref 15–41)
Albumin: 3.8 g/dL (ref 3.5–5.0)
Alkaline Phosphatase: 60 U/L (ref 38–126)
Anion gap: 6 (ref 5–15)
BUN: 15 mg/dL (ref 6–20)
CO2: 30 mmol/L (ref 22–32)
Calcium: 9.7 mg/dL (ref 8.9–10.3)
Chloride: 102 mmol/L (ref 98–111)
Creatinine, Ser: 1.27 mg/dL — ABNORMAL HIGH (ref 0.61–1.24)
GFR, Estimated: >60 mL/min (ref >60)
Glucose, Bld: 100 mg/dL — ABNORMAL HIGH (ref 70–99)
Potassium: 4.8 mmol/L (ref 3.5–5.1)
Sodium: 138 mmol/L (ref 135–145)
Total Bilirubin: 1.4 mg/dL — ABNORMAL HIGH (ref 0.3–1.2)
Total Protein: 7.9 g/dL (ref 6.5–8.1)

## 2020-12-10 LAB — BRAIN NATRIURETIC PEPTIDE: B Natriuretic Peptide: 25.3 pg/mL (ref 0.0–100.0)

## 2020-12-10 MED ORDER — SILDENAFIL CITRATE 50 MG PO TABS: 50.0000 mg | ORAL_TABLET | Freq: Every day | ORAL | 6 refills | Status: AC | PRN

## 2020-12-10 MED ORDER — CARVEDILOL 6.25 MG PO TABS: 6.2500 mg | ORAL_TABLET | Freq: Two times a day (BID) | ORAL | 6 refills | Status: DC

## 2020-12-10 NOTE — Progress Notes (Signed)
  Echocardiogram 2D Echocardiogram has been performed.  Kevin Baxter 12/10/2020, 10:49 AM

## 2020-12-10 NOTE — Patient Instructions (Signed)
Increase Carvedilol to 6.25 mg Twice daily   Take Sildenafil 50 mg AS NEEDED  Labs done today, your results will be available in MyChart, we will contact you for abnormal readings.  Your physician has requested that you have a cardiac MRI. Cardiac MRI uses a computer to create images of your heart as its beating, producing both still and moving pictures of your heart and major blood vessels. For further information please visit [email or web address] Please follow the instruction sheet given to you today for more information. ONCE APPROVED BY YOUR INSURANCE WE WILL CALL YOU TO SCHEDULE THIS  If you have any questions or concerns before your next appointment please send [place] a message through [place] or call our office at [phone].    TO LEAVE A MESSAGE FOR THE NURSE SELECT OPTION 2, PLEASE LEAVE A MESSAGE INCLUDING: YOUR NAME DATE OF BIRTH CALL BACK NUMBER REASON FOR CALL**this is important as we prioritize the call backs  YOU WILL RECEIVE A CALL BACK THE SAME DAY AS LONG AS YOU CALL BEFORE 4:00 PM  At the [facility], you and your health needs are our priority. As part of our continuing mission to provide you with exceptional heart care, we have created designated Provider Care Teams. These Care Teams include your primary Cardiologist (physician) and Advanced Practice Providers (APPs- Physician Assistants and Nurse Practitioners) who all work together to provide you with the care you need, when you need it.   You may see any of the following providers on your designated Care Team at your next follow up: Dr Glori Bickers Dr Haynes Kerns, NP Lyda Jester, [place] [facility] [place], [place] Audry Riles, PharmD   Please be sure to bring in all your medications bottles to every appointment.

## 2020-12-10 NOTE — Progress Notes (Signed)
ADVANCED HF CLINIC VISIT  PCP: Colon Branch, MD Primary Cardiologists: Fransico Him, Solmon Bohr  HPI:  Kevin Baxter is a 50 y.o. male with HTN, morbid obesity, pancreatitis, DVT with submassive PE and systolic HF due to NICM.  Admitted in 3/22 for intractable n/v. Initially felt to have acute cholecystitis. Echo showed EF 15% with mod RV dysfunction. + large LV clot. Started on inotropes. Cath showed normal coronaries. cMRI LVEF 12% Apical akinesis Elevated ECV in apex. RVEF 21%.   Post -cath developed worsening CP and tachycardia. CT showed bilateral submassive PE with pulmonary infarct. Underwent catheter-based thrombectomy of PE. Milrinone slowly weaned off. Discharged 04/24/20.   Here for routine follow-up. Feels good. Walking 3 miles every other day. Walks fast. No CP or SOB. No edema, orthopnea or PND. SBP 115-120 range   Echo 06/09/20 EF 25-30% RV mild HK ? Small laminated apical clot  Echo today 12/10/20 EF 30-35% Apical AK with small apical thrombus RV ok  ROS: All systems negative except as listed in HPI, PMH and Problem List.  SH:  Social History   Socioeconomic History   Marital status: Married    Spouse name: Not on file   Number of children: 2   Years of education: Not on file   Highest education level: Some college, no degree  Occupational History   Occupation: works for [employer]  Tobacco Use   Smoking status: Never   Smokeless tobacco: Never  Vaping Use   Vaping Use: Never used  Substance and Sexual Activity   Alcohol use: Yes    Comment: very rare    Drug use: No   Sexual activity: Not on file  Other Topics Concern   Not on file  Social History Narrative   Household: pt, wife daughter (2013),  boy  (2006),    Social Determinants of Radio broadcast assistant Strain: Not on file  Food Insecurity: Not on file  Transportation Needs: Not on file  Physical Activity: Not on file  Stress: Not on file  Social  Connections: Not on file  Intimate Partner Violence: Not on file    FH:  Family History  Problem Relation Age of Onset   Cancer Mother        CERVICAL   Hypertension Father    Heart failure Father        no MI, d/t etoh   Diabetes Sister    Diabetes Sister    Diabetes Paternal Aunt    Diabetes Cousin    Colon cancer Neg Hx    Prostate cancer Neg Hx    Stroke Neg Hx    Esophageal cancer Neg Hx    Rectal cancer Neg Hx    Stomach cancer Neg Hx     Past Medical History:  Diagnosis Date   Allergy    Anxiety    Arthritis    CHF (congestive heart failure) ([facility])    Fundic gland polyposis of stomach    GERD with esophagitis    Gout    Hx of adenomatous colonic polyps    Hypertension     Current Outpatient Medications  Medication Sig Dispense Refill   allopurinol (ZYLOPRIM) 300 MG tablet Take 1 tablet (300 mg total) by mouth daily. 90 tablet 3   apixaban (ELIQUIS) 5 MG  TABS tablet Take 1 tablet (5 mg total) by mouth 2 (two) times daily. 180 tablet 1   empagliflozin (JARDIANCE) 10 MG TABS tablet Take 1 tablet (10 mg total) by mouth daily. 30 tablet 6   ENTRESTO 24-26 MG TAKE 1 TABLET BY MOUTH TWICE A DAY 60 tablet 6   ivabradine (CORLANOR) 5 MG TABS tablet Take 1 tablet (5 mg total) by mouth 2 (two) times daily with a meal. 180 tablet 1   pantoprazole (PROTONIX) 40 MG tablet TAKE 1 TABLET BY MOUTH EVERY DAY 90 tablet 1   sertraline (ZOLOFT) 50 MG tablet Take 1 tablet (50 mg total) by mouth daily. 90 tablet 0   sildenafil (VIAGRA) 50 MG tablet Take 1 tablet (50 mg total) by mouth daily as needed for erectile dysfunction. 10 tablet 6   spironolactone (ALDACTONE) 25 MG tablet Take 0.5 tablets (12.5 mg total) by mouth daily. 30 tablet 6   carvedilol (COREG) 6.25 MG tablet Take 1 tablet (6.25 mg total) by mouth 2 (two) times daily with a meal. 60 tablet 6   No current facility-administered medications for this encounter.    Vitals:   12/10/20 1124  BP: 140/80  Pulse: 75   SpO2: 99%  Weight: 130.2 kg (287 lb)      PHYSICAL EXAM:  General:  Well appearing. No resp difficulty HEENT: normal Neck: supple. no JVD. Carotids 2+ bilat; no bruits. No lymphadenopathy or thryomegaly appreciated. Cor: PMI nondisplaced. Regular rate & rhythm. No rubs, gallops or murmurs. Lungs: clear Abdomen: soft, nontender, nondistended. No hepatosplenomegaly. No bruits or masses. Good bowel sounds. Extremities: no cyanosis, clubbing, rash, edema Neuro: alert & orientedx3, cranial nerves grossly intact. moves all 4 extremities w/o difficulty. Affect pleasant  ASSESSMENT & PLAN:    1. Acute systolic biventricular CHF with cardiogenic shock: - ECHO 3/22  LVEF < 20% with large LV thrombus,mild to moderate RV dysfunction - Cardiac catheterization 04/16/20 with normal coronaries - cMRI 3/22 LVEF 12% RVEF 21%. Apical akinesis Elevated ECV in apex. No LGE in apex. RVEF 21%. - also complicated by RV failure from PE - Echo  06/09/20 EF 25-30% RV mild HK ? Small laminated apical clot  - Echo today 12/10/20 EF 30-35% Apical AK with small apical thrombus RV ok - Remains NYHA I - Volume status ok using torsemide prn - Continue spironolactone 12.5 - Continue entresto 24/26 BID - Continue ivabradine to 5 bid - Continue jardiance 10  - Increase carvedilol to 6.25 bid - labs today - EF continues to improve slowly but LV apex looks infarcted. I reviewed cath and cMRI with Dr. Rubye Beach. Cath with no significant CAD in apex. Unclear if previous infarct of other process. Will repeat cMRI. May need PET.   2. LV thrombus: - Still with probable small apical clot on echo today - Continue Eliquis. No bleeding    3. 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 Also R femoral DVT - Chest CT 3/22 bilateral submassive PE with RV strain and RUL infarct - s/p Penumbra clot extraction 04/16/20 - RV normal today - Will need lifelong Eliquis. Can drop dose to 2.5 bid based  on Amplify-EXT once off AC for LV thrombus   Glori Bickers, MD  11:37 AM

## 2020-12-20 ENCOUNTER — Telehealth (HOSPITAL_COMMUNITY): Payer: Self-pay | Admitting: *Deleted

## 2021-01-18 ENCOUNTER — Telehealth (HOSPITAL_COMMUNITY): Payer: Self-pay | Admitting: *Deleted

## 2021-01-18 NOTE — Telephone Encounter (Signed)
Reaching out to patient to offer assistance regarding upcoming cardiac imaging study; pt verbalizes understanding of appt date/time, parking situation and where to check in, and verified current allergies; name and call back number provided for further questions should they arise  Gordy Clement RN Navigator Cardiac Imaging [facility] [phone] office [phone] cell  Patient had MRI in March of this year.  Denies metal or claustrophobia.

## 2021-01-19 ENCOUNTER — Ambulatory Visit (HOSPITAL_COMMUNITY)
Admission: RE | Admit: 2021-01-19 | Discharge: 2021-01-19 | Disposition: A | Discharge status: Home / Self Care | Payer: 59 | Source: Ambulatory Visit | Attending: Internal Medicine | Admitting: Internal Medicine

## 2021-01-19 ENCOUNTER — Other Ambulatory Visit: Payer: Self-pay

## 2021-01-19 DIAGNOSIS — I5022 Chronic systolic (congestive) heart failure: Secondary | ICD-10-CM | POA: Insufficient documentation

## 2021-01-19 IMAGING — MR MR CARD MORPHOLOGY WO/W CM
45 of 48 series · 45 of 48 positions shown · IV contrast (gadavist)
Comparison: none

CLINICAL DATA: Clinical question of cardiomyopathy
Study assumes HCT of 59 and BSA of 2.58 m2.

EXAM:
CARDIAC MRI
TECHNIQUE: The patient was scanned on a 1.5 Tesla GE magnet. A dedicated
cardiac coil was used. Functional imaging was done using Fiesta
sequences. [DATE], and 4 chamber views were done to assess for RWMA's.
Modified CHELITA rule using a short axis stack was used to
calculate an ejection fraction on a dedicated work station using
Circle software. The patient received 10 cc of Gadavist. After 10
minutes inversion recovery sequences were used to assess for
infiltration and scar tissue.
CONTRAST:  10 cc  of Gadavist

[Series 4: [id number] · axial · 8.0mm · 1.56mm/px · 1 of 16 slices shown]
[im 1/16]
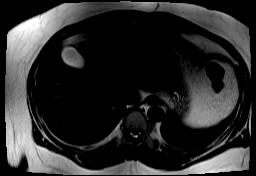

[Series 8: bSSFP · oblique · 8.0mm · 1.61mm/px · 1 of 25 slices shown (1 of 20)]
[im 1/25]
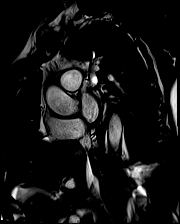

[Series 9: bSSFP · oblique · 8.0mm · 1.61mm/px · 1 of 25 slices shown (2 of 20)]
[im 1/25]
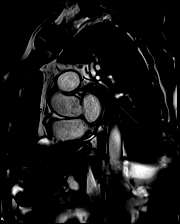

[Series 10: bSSFP · oblique · 8.0mm · 1.70mm/px · 1 of 25 slices shown (3 of 20)]
[im 1/25]
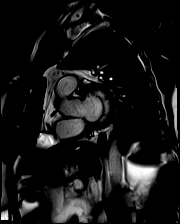

[Series 11: bSSFP · oblique · 8.0mm · 1.70mm/px · 1 of 25 slices shown (4 of 20)]
[im 1/25]
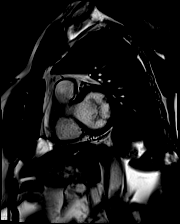

[Series 12: bSSFP · oblique · 8.0mm · 1.70mm/px · 1 of 25 slices shown (5 of 20)]
[im 1/25]
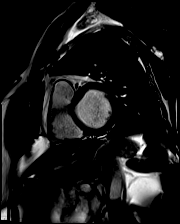

[Series 13: bSSFP · oblique · 8.0mm · 1.70mm/px · 1 of 25 slices shown (6 of 20)]
[im 1/25]
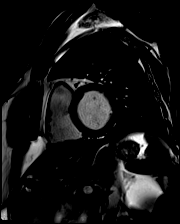

[Series 14: bSSFP · oblique · 8.0mm · 1.70mm/px · 1 of 25 slices shown (7 of 20)]
[im 1/25]
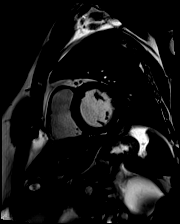

[Series 15: bSSFP · oblique · 8.0mm · 1.70mm/px · 1 of 25 slices shown (8 of 20)]
[im 1/25]
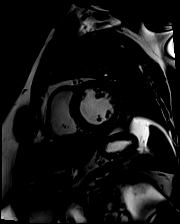

[Series 16: bSSFP · oblique · 8.0mm · 1.70mm/px · 1 of 25 slices shown (9 of 20)]
[im 1/25]
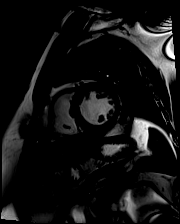

[Series 17: bSSFP · oblique · 8.0mm · 1.70mm/px · 1 of 25 slices shown (10 of 20)]
[im 1/25]
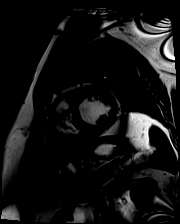

[Series 18: bSSFP · oblique · 8.0mm · 1.70mm/px · 1 of 25 slices shown (11 of 20)]
[im 1/25]
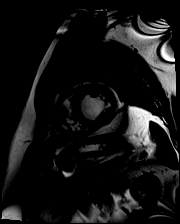

[Series 19: bSSFP · oblique · 8.0mm · 1.70mm/px · 1 of 25 slices shown (12 of 20)]
[im 1/25]
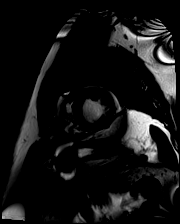

[Series 20: bSSFP · oblique · 8.0mm · 1.70mm/px · 1 of 25 slices shown (13 of 20)]
[im 1/25]
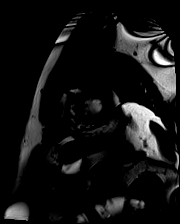

[Series 21: bSSFP · oblique · 8.0mm · 1.70mm/px · 1 of 25 slices shown (14 of 20)]
[im 1/25]
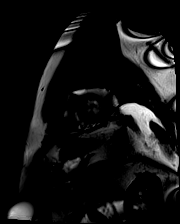

[Series 22: bSSFP · oblique · 8.0mm · 1.70mm/px · 1 of 25 slices shown (15 of 20)]
[im 1/25]
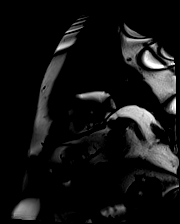

[Series 23: bSSFP · oblique · 8.0mm · 1.70mm/px · 1 of 25 slices shown (16 of 20)]
[im 1/25]
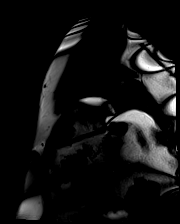

[Series 24: bSSFP · oblique · 8.0mm · 1.70mm/px · 1 of 25 slices shown (17 of 20)]
[im 1/25]
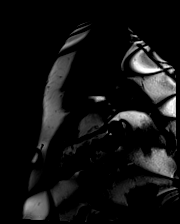

[Series 25: bSSFP · oblique · 6.0mm · 1.41mm/px · 1 of 25 slices shown (18 of 20)]
[im 1/25]
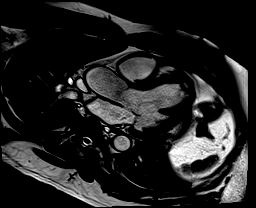

[Series 26: bSSFP · oblique · 6.0mm · 1.41mm/px · 1 of 25 slices shown (19 of 20)]
[im 1/25]
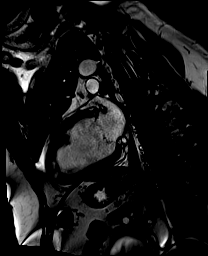

[Series 27: bSSFP · axial · 6.0mm · 1.41mm/px · 1 of 25 slices shown (20 of 20)]
[im 1/25]
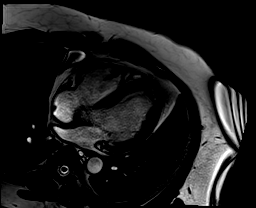

[Series 28: 4ch stack · axial · 6.0mm · 1.50mm/px · 1 of 25 slices shown (1 of 13)]
[im 1/25]
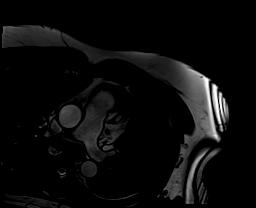

[Series 29: 4ch stack · axial · 6.0mm · 1.50mm/px · 1 of 25 slices shown (2 of 13)]
[im 1/25]
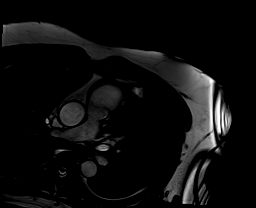

[Series 30: 4ch stack · axial · 6.0mm · 1.50mm/px · 1 of 25 slices shown (3 of 13)]
[im 1/25]
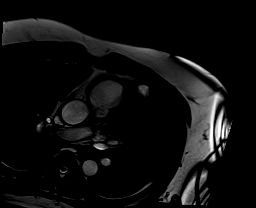

[Series 31: 4ch stack · axial · 6.0mm · 1.50mm/px · 1 of 25 slices shown (4 of 13)]
[im 1/25]
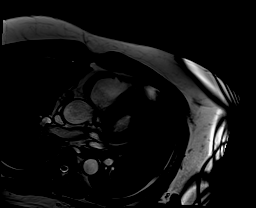

[Series 32: 4ch stack · axial · 6.0mm · 1.50mm/px · 1 of 25 slices shown (5 of 13)]
[im 1/25]
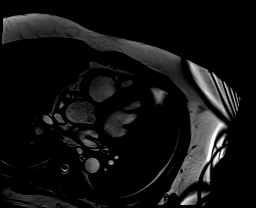

[Series 33: 4ch stack · axial · 6.0mm · 1.50mm/px · 1 of 25 slices shown (6 of 13)]
[im 1/25]
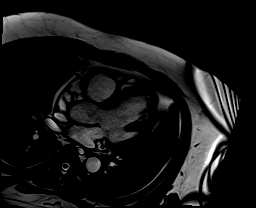

[Series 34: 4ch stack · axial · 6.0mm · 1.50mm/px · 1 of 25 slices shown (7 of 13)]
[im 1/25]
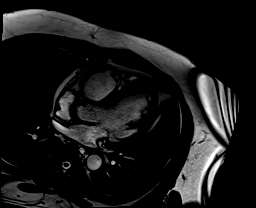

[Series 35: 4ch stack · axial · 6.0mm · 1.50mm/px · 1 of 25 slices shown (8 of 13)]
[im 1/25]
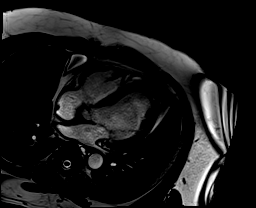

[Series 36: 4ch stack · axial · 6.0mm · 1.50mm/px · 1 of 25 slices shown (9 of 13)]
[im 1/25]
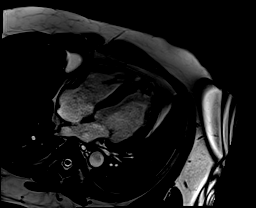

[Series 37: 4ch stack · axial · 6.0mm · 1.50mm/px · 1 of 25 slices shown (10 of 13)]
[im 1/25]
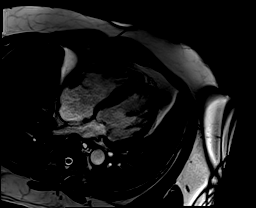

[Series 38: 4ch stack · axial · 6.0mm · 1.50mm/px · 1 of 25 slices shown (11 of 13)]
[im 1/25]
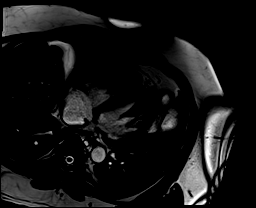

[Series 39: 4ch stack · axial · 6.0mm · 1.50mm/px · 1 of 25 slices shown (12 of 13)]
[im 1/25]
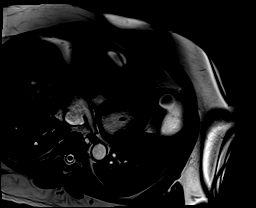

[Series 40: 4ch stack · axial · 6.0mm · 1.50mm/px · 1 of 25 slices shown (13 of 13)]
[im 1/25]
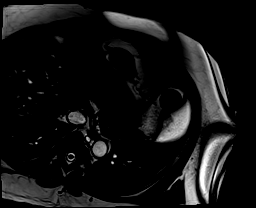

[Series 41: (id)_long_t1 · oblique · 8.0mm · 1.56mm/px · 1 of 24 slices shown]
[im 1/24]
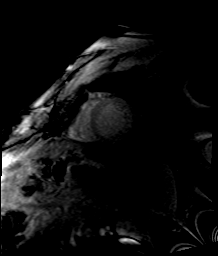

[Series 42: (id)_long_t1_moco · oblique · 8.0mm · 1.56mm/px · 1 of 24 slices shown]
[im 1/24]
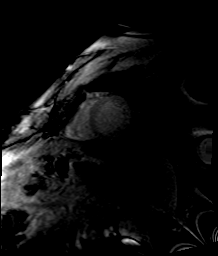

[Series 43: (id)_long_t1_moco_t1 · oblique · 8.0mm · 1.56mm/px · 1 of 6 slices shown]
[im 1/6]
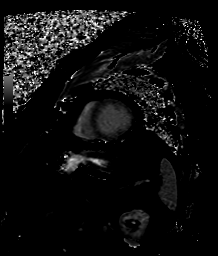

[Series 45: (id)_trufi · oblique · 8.0mm · 2.08mm/px · 1 of 9 slices shown]
[im 1/9]
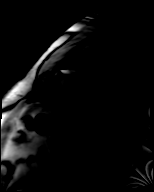

[Series 46: (id)_trufi_moco · oblique · 8.0mm · 2.08mm/px · 1 of 9 slices shown]
[im 1/9]
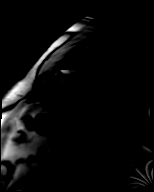

[Series 47: (id)_trufi_moco_t2 · oblique · 8.0mm · 2.08mm/px · 1 of 3 slices shown]
[im 1/3]
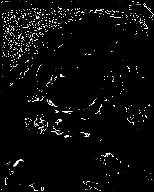

[Series 49: pre short axis · oblique · non-contrast · 8.0mm · 2.25mm/px · 1 of 10 slices shown (1 of 5)]
[im 1/10]
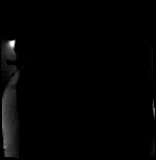

[Series 50: pre short axis · oblique · non-contrast · 8.0mm · 2.25mm/px · 1 of 10 slices shown (2 of 5)]
[im 1/10]
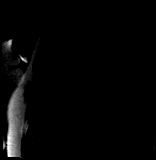

[Series 51: pre short axis · oblique · non-contrast · 8.0mm · 2.25mm/px · 1 of 10 slices shown (3 of 5)]
[im 1/10]
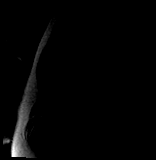

[Series 52: pre short axis · oblique · non-contrast · 8.0mm · 2.25mm/px · 1 of 10 slices shown (4 of 5)]
[im 1/10]
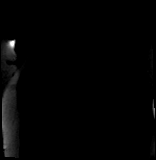

[Series 53: pre short axis · oblique · non-contrast · 8.0mm · 2.25mm/px · 1 of 10 slices shown (5 of 5)]
[im 1/10]
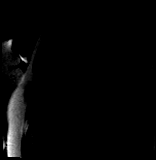

[45 of 48 positions shown; findings below may reference images not displayed]

FINDINGS: 1. Mildly dilated left ventricular size, with LVEDD 67 mm, but
LVEDVi 97 mL/m2.

Normal left ventricular thickness, with intraventricular septal
thickness of 10 mm, posterior wall thickness of 8 mm, and myocardial
mass index of 66 g/m2.

Moderately decreased left ventricular systolic function (LVEF =33).
There is apical hypokinesis in additional to global hypokinesis

Left ventricular parametric mapping notable for normal ECV and T2
signal.

There is no late gadolinium enhancement in the left ventricular
myocardium.

Apical thrombus has resolved.

2. Normal right ventricular size with RVEDVI 82 mL/m2.

Normal right ventricular thickness.

Mildly reduced right ventricular systolic function (RVEF =40%).
There are no regional wall motion abnormalities or aneurysms.

3.  Normal left and right atrial size.

4. Normal size of the aortic root, ascending aorta and pulmonary
artery.

5. Valve assessment:

Aortic Valve: Tri-leaflet aortic valve. Qualitatively no significant
regurgitation.

Pulmonic Valve: Qualitatively no significant regurgitation.

Tricuspid Valve: Qualitatively no significant regurgitation.

Mitral Valve: No significant regurgitation.

6.  Normal pericardium.  No pericardial effusion.

7. Grossly, no extracardiac findings. Recommended dedicated study if
concerned for non-cardiac pathology.
IMPRESSION: Left ventricular is less dilated with improved LVEF.

ECV elevation in the apex has resolved.

LV thrombus has resolved.

RVEF has improved.

## 2021-01-19 MED ORDER — GADOBUTROL 1 MMOL/ML IV SOLN
10.0000 mL | Freq: Once | INTRAVENOUS | Status: AC | PRN
  Administered 2021-01-19: 11:00:00 10 mL via INTRAVENOUS

## 2021-01-25 ENCOUNTER — Ambulatory Visit (INDEPENDENT_AMBULATORY_CARE_PROVIDER_SITE_OTHER): Payer: 59 | Admitting: Internal Medicine

## 2021-01-25 ENCOUNTER — Encounter: Payer: Self-pay | Admitting: Internal Medicine

## 2021-01-25 VITALS — BP 126/86 | HR 77 | Temp 98.2°F | Resp 18 | Ht 73.0 in | Wt 291.1 lb

## 2021-01-25 DIAGNOSIS — B029 Zoster without complications: Secondary | ICD-10-CM

## 2021-01-25 DIAGNOSIS — F439 Reaction to severe stress, unspecified: Secondary | ICD-10-CM | POA: Diagnosis not present

## 2021-01-25 MED ORDER — VALACYCLOVIR HCL 1 G PO TABS: 1000.0000 mg | ORAL_TABLET | Freq: Three times a day (TID) | ORAL | 0 refills | Status: DC

## 2021-01-25 NOTE — Progress Notes (Signed)
Subjective:    Patient ID: Kevin Baxter, male    DOB: 30-May-1970, 50 y.o.   MRN: [id number]  DOS:  01/25/2021 Type of visit - description: Acute visit here with his wife. Symptoms started 1 week ago with a rash the left side of the head and neck. Started  red bumps, no blisters. They both hurt and itch. Denies any ear pain or difficulty hearing.  He reports a lot of stress lately.  Review of Systems   Past Medical History:  Diagnosis Date   Allergy    Anxiety    Arthritis    CHF (congestive heart failure) (HCC)    Fundic gland polyposis of stomach    GERD with esophagitis    Gout    Hx of adenomatous colonic polyps    Hypertension     Past Surgical History:  Procedure Laterality Date   COLONOSCOPY  05/2019   ESOPHAGOGASTRODUODENOSCOPY  05/2019   HAND SURGERY Right    2006 for a FX   IR ANGIOGRAM PULMONARY BILATERAL SELECTIVE  04/16/2020   IR ANGIOGRAM SELECTIVE EACH ADDITIONAL VESSEL  04/16/2020   IR ANGIOGRAM SELECTIVE EACH ADDITIONAL VESSEL  04/16/2020   IR THROMBECT PRIM MECH INIT (INCLU) MOD SED  04/16/2020   IR US GUIDE VASC ACCESS LEFT  04/16/2020   IR US GUIDE VASC ACCESS LEFT  04/16/2020   IR US GUIDE VASC ACCESS RIGHT  04/16/2020   RADIOLOGY WITH ANESTHESIA N/A 04/16/2020   Procedure: IR WITH ANESTHESIA- PULMONARY EMBOLUS INTERVENTION;  Surgeon: Suzette Battiest, MD;  Location: [facility];  Service: Radiology;  Laterality: N/A;   RIGHT/LEFT HEART CATH AND CORONARY ANGIOGRAPHY N/A 04/15/2020   Procedure: RIGHT/LEFT HEART CATH AND CORONARY ANGIOGRAPHY;  Surgeon: Jolaine Artist, MD;  Location: [facility];  Service: Cardiovascular;  Laterality: N/A;    Social History   Socioeconomic History   Marital status: Married    Spouse name: Not on file   Number of children: 2   Years of education: Not on file   Highest education level: Some college, no degree  Occupational History   Occupation: works for [employer]  Tobacco Use   Smoking status: Never    Smokeless tobacco: Never  Vaping Use   Vaping Use: Never used  Substance and Sexual Activity   Alcohol use: Yes    Comment: very rare    Drug use: No   Sexual activity: Not on file  Other Topics Concern   Not on file  Social History Narrative   Household: pt, wife daughter (2013),  boy  (2006),    Social Determinants of Health   Financial Resource Strain: Not on file  Food Insecurity: Not on file  Transportation Needs: Not on file  Physical Activity: Not on file  Stress: Not on file  Social Connections: Not on file  Intimate Partner Violence: Not on file      Allergies as of 01/25/2021       Reactions   Pseudoephedrine    REACTION: jittery        Medication List        Accurate as of January 25, 2021 11:59 PM. If you have any questions, ask your nurse or doctor.          allopurinol 300 MG tablet Commonly known as: ZYLOPRIM Take 1 tablet (300 mg total) by mouth daily.   carvedilol 6.25 MG tablet Commonly known as: Coreg Take 1 tablet (6.25 mg total) by mouth 2 (two) times  daily with a meal.   Eliquis 5 MG Tabs tablet Generic drug: apixaban Take 1 tablet (5 mg total) by mouth 2 (two) times daily.   empagliflozin 10 MG Tabs tablet Commonly known as: JARDIANCE Take 1 tablet (10 mg total) by mouth daily.   Entresto 24-26 MG Generic drug: sacubitril-valsartan TAKE 1 TABLET BY MOUTH TWICE A DAY   ivabradine 5 MG Tabs tablet Commonly known as: CORLANOR Take 1 tablet (5 mg total) by mouth 2 (two) times daily with a meal.   pantoprazole 40 MG tablet Commonly known as: PROTONIX TAKE 1 TABLET BY MOUTH EVERY DAY   sertraline 50 MG tablet Commonly known as: ZOLOFT Take 1 tablet (50 mg total) by mouth daily.   sildenafil 50 MG tablet Commonly known as: Viagra Take 1 tablet (50 mg total) by mouth daily as needed for erectile dysfunction.   spironolactone 25 MG tablet Commonly known as: ALDACTONE Take 0.5 tablets (12.5 mg total) by mouth daily.    valACYclovir 1000 MG tablet Commonly known as: Valtrex Take 1 tablet (1,000 mg total) by mouth 3 (three) times daily. Started by: Kathlene November, MD           Objective:   Physical Exam BP 126/86 (BP Location: Left Arm, Patient Position: Sitting, Cuff Size: Normal)    Pulse 77    Temp 98.2 F (36.8 C) (Oral)    Resp 18    Ht 6\' 1"  (1.854 m)    Wt 291 lb 2 oz (132.1 kg)    SpO2 97%    BMI 38.41 kg/m  General:   Well developed, NAD, BMI noted. HEENT:  Normocephalic . Face symmetric, atraumatic Skin: Dried up rash noted at the left side of the head and neck on a C2 distribution.  See picture. Neurologic:  alert & oriented X3.  Speech normal, gait appropriate for age and unassisted Psych--  Cognition and judgment appear intact.  Cooperative with normal attention span and concentration.  Behavior appropriate. No anxious or depressed appearing.       Assessment       Assessment: Prediabetes since 2004, a1c 6.2  HTN Gout CV, Pulm: -Acute biventricular CHF with cardiogenic shock Dx 03-2020 -Submassive pulmonary embolism and R leg DVT: Dx 03-2020, rx life long anticoag per cards note, 1/2 dose after 09-2020 - LV thrombus Morbid obesity  ED Asthma as a child   PLAN: Shingles: Suspect this is shingles  in the C2 distribution. Extensive discussion about the issue, multiple questions answered to the best of my ability. He would like some treatment, I sent Valtrex understanding that it may not have much of an impact at this stage of the rash. Information printed for the patient. At some point will need a vaccine. Stress: Under a lot of stress, listening therapy provided, we can discuss more when he comes back. Cardiovascular: Last visit with cardiology 12/10/2020, carvedilol was increased, BMP noted to be very good. Pulmonary emboli and R  femoral DVT on anticoagulation definitely Encouraged to RTC for a physical exam at his earliest convenience.     This visit occurred during  the SARS-CoV-2 public health emergency.  Safety protocols were in place, including screening questions prior to the visit, additional usage of staff PPE, and extensive cleaning of exam room while observing appropriate contact time as indicated for disinfecting solutions.

## 2021-01-25 NOTE — Patient Instructions (Signed)
GO TO THE FRONT DESK, PLEASE SCHEDULE YOUR APPOINTMENTS Come back for   a physical exam at your convenience    Shingles Shingles is an infection. It gives you a painful skin rash and blisters that have fluid in them. Shingles is caused by the same germ (virus) that causes chickenpox. Shingles only happens in people who: Have had chickenpox. Have been given a shot (vaccine) to protect against chickenpox. Shingles is rare in this group. What are the causes? This condition is caused by varicella-zoster virus. This is the same germ that causes chickenpox. After a person is exposed to the germ, the germ stays in the body but is not active (dormant). Shingles develops if the germ becomes active again (is reactivated). This can happen many years after the first exposure to the germ. It is not known what causes this germ to become active again. What increases the risk? People who have had chickenpox or received the chickenpox shot are at risk for shingles. This infection is more common in people who: Are older than 52 years of age. Have a weakened disease-fighting system (immune system), such as people with: HIV (human immunodeficiency virus). AIDS (acquired immunodeficiency syndrome). Cancer. Are taking medicines that weaken the immune system, such as organ transplant medicines. Have a lot of stress. What are the signs or symptoms? The first symptoms of shingles may be itching, tingling, or pain in an area on your skin. A rash will show on your skin a few days or weeks later. This is what usually happens: The rash is likely to be on one side of your body. The rash usually has a shape like a belt or a band. Over time, the rash turns into fluid-filled blisters. The blisters will break open and change into scabs. The scabs usually dry up in about 2-3 weeks. You may also have: A fever. Chills. A headache. A feeling like you may vomit (nausea). How is this treated? The rash may last for  several weeks. There is not a specific cure for this condition. Your doctor may prescribe medicines. Medicines may: Help with pain. Help you get better sooner. Help to prevent long-term problems. Help with itching (antihistamines). If the area involved is on your face, you may need to see a specialist. This may be an eye doctor or an ear, nose, and throat (ENT) doctor. Follow these instructions at home: Medicines Take over-the-counter and prescription medicines only as told by your doctor. Put on an anti-itch cream or numbing cream where you have a rash, blisters, or scabs. Do this as told by your doctor. Helping with itching and discomfort  Put cold, wet cloths (cold compresses) on the area of the rash or blisters as told by your doctor. Cool baths can help you feel better. Try adding baking soda or dry oatmeal to the water to lessen itching. Do not bathe in hot water. Use calamine lotion as told by your doctor. Blister and rash care Keep your rash covered with a loose bandage (dressing). Wear loose clothing that does not rub on your rash. Wash your hands with soap and water for at least 20 seconds before and after you change your bandage. If you cannot use soap and water, use hand sanitizer. Change your bandage as told by your doctor. Keep your rash and blisters clean. To do this, wash the area with mild soap and cool water as told by your doctor. Check your rash every day for signs of infection. Check for: More redness, swelling,  or pain. Fluid or blood. Warmth. Pus or a bad smell. Do not scratch your rash. Do not pick at your blisters. To help you to not scratch: Keep your fingernails clean and cut short. Wear gloves or mittens when you sleep, if scratching is a problem. General instructions Rest as told by your doctor. Wash your hands often with soap and water for at least 20 seconds. If you cannot use soap and water, use hand sanitizer. Doing this lowers your chance of getting a  skin infection. Your infection can cause chickenpox in people who have never had chickenpox or never got a chickenpox vaccine shot. If you have blisters that did not change into scabs yet, try not to touch other people or be around other people, especially: Babies. Pregnant women. Children who have areas of red, itchy, or rough skin (eczema). Older people who have organ transplants. People who have a long-term (chronic) illness, like cancer or AIDS. Keep all follow-up visits. How is this prevented? A vaccine shot is the best way to prevent shingles and protect against shingles problems. If you have not had a vaccine shot, talk with your doctor about getting it. Where to find more information Centers for Disease Control and Prevention: http://www.wolf.info/ Contact a doctor if: Your pain does not get better with medicine. Your pain does not get better after the rash heals. You have any of these signs of infection around the rash: More redness, swelling, or pain. Fluid or blood. Warmth. Pus or a bad smell. You have a fever. Get help right away if: The rash is on your face or nose. You have pain in your face or pain by your eye. You lose feeling on one side of your face. You have trouble seeing. You have ear pain, or you have ringing in your ear. You have a loss of taste. Your condition gets worse. Summary Shingles gives you a painful skin rash and blisters that have fluid in them. Shingles is caused by the same germ (virus) that causes chickenpox. Keep your rash covered with a loose bandage. Wear loose clothing that does not rub on your rash. If you have blisters that did not change into scabs yet, try not to touch other people or be around people. This information is not intended to replace advice given to you by your health care provider. Make sure you discuss any questions you have with your health care provider. Document Revised: 01/12/2020 Document Reviewed: 01/12/2020 Elsevier Patient  Education  2022 [employer].

## 2021-01-26 NOTE — Assessment & Plan Note (Signed)
Shingles: Suspect this is shingles  in the C2 distribution. Extensive discussion about the issue, multiple questions answered to the best of my ability. He would like some treatment, I sent Valtrex understanding that it may not have much of an impact at this stage of the rash. Information printed for the patient. At some point will need a vaccine. Stress: Under a lot of stress, listening therapy provided, we can discuss more when he comes back. Cardiovascular: Last visit with cardiology 12/10/2020, carvedilol was increased, BMP noted to be very good. Pulmonary emboli and R  femoral DVT on anticoagulation definitely Encouraged to RTC for a physical exam at his earliest convenience.

## 2021-02-16 ENCOUNTER — Other Ambulatory Visit: Payer: Self-pay

## 2021-02-16 ENCOUNTER — Emergency Department
Admission: EM | Admit: 2021-02-16 | Discharge: 2021-02-16 | Disposition: A | Discharge status: Home / Self Care | Payer: 59 | Source: Home / Self Care

## 2021-02-16 DIAGNOSIS — J3489 Other specified disorders of nose and nasal sinuses: Secondary | ICD-10-CM | POA: Diagnosis not present

## 2021-02-16 DIAGNOSIS — J0100 Acute maxillary sinusitis, unspecified: Secondary | ICD-10-CM | POA: Diagnosis not present

## 2021-02-16 LAB — POC SARS CORONAVIRUS 2 AG -  ED: SARS Coronavirus 2 Ag: NEGATIVE

## 2021-02-16 MED ORDER — PREDNISONE 20 MG PO TABS: ORAL_TABLET | ORAL | 0 refills | Status: DC

## 2021-02-16 MED ORDER — AMOXICILLIN-POT CLAVULANATE 875-125 MG PO TABS: 1.0000 | ORAL_TABLET | Freq: Two times a day (BID) | ORAL | 0 refills | Status: AC

## 2021-02-16 NOTE — ED Provider Notes (Signed)
[facility]    CSN: [id number] Arrival date & time: 02/16/21  1211      History   Chief Complaint Chief Complaint  Patient presents with   Cough    Cough, chest congestion, nasal congestion and facial pressure. X1 week    HPI Kevin Baxter is a 51 y.o. male.   HPI 51 year old male presents with cough, chest congestion nasal congestion and facial pressure for 1 week.  PMH significant for CHF and HTN.   Past Medical History:  Diagnosis Date   Allergy    Anxiety    Arthritis    CHF (congestive heart failure) ([facility])    Fundic gland polyposis of stomach    GERD with esophagitis    Gout    Hx of adenomatous colonic polyps    Hypertension     Patient Active Problem List   Diagnosis Date Noted   Microscopic hematuria 06/03/2020   Protein-calorie malnutrition, severe 04/20/2020   Congestive heart failure (CHF) ([place])    Acute saddle pulmonary embolism with acute cor pulmonale (HCC)    Localized swelling of right lower extremity 04/12/2020   Multiple gastric polyps 04/12/2020   Poor responsiveness after MAC anesthesia 04/09/2020 04/12/2020   Hematemesis with nausea 04/12/2020   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   Left ventricular ejection fraction less than 20% 04/12/2020   Acute acalculous cholecystitis 04/12/2020   Mural thrombus of cardiac apex 04/12/2020   Acute deep vein thrombosis (DVT) of right femoral vein ([place]) 04/12/2020   Weight loss, unintentional 04/06/2020   Esophageal dysphagia 04/06/2020   Abnormal LFTs 04/06/2020   Depression, major, single episode, moderate ([place]) 03/02/2020   Pancreatitis, acute 02/26/2020   Hx of adenomatous colonic polyps    Morbid obesity ([place]) 04/22/2015   PCP NOTES >>>>>>>>>>>>>>>>>>>>>>>>> 04/22/2015   Hyperglycemia 03/11/2014   Erectile dysfunction 07/21/2011   Annual physical exam 01/05/2011   Gout 03/14/2006   Essential hypertension 03/14/2006   ALLERGIC RHINITIS 03/14/2006    Past Surgical  History:  Procedure Laterality Date   COLONOSCOPY  05/2019   ESOPHAGOGASTRODUODENOSCOPY  05/2019   HAND SURGERY Right    2006 for a FX   IR ANGIOGRAM PULMONARY BILATERAL SELECTIVE  04/16/2020   IR ANGIOGRAM SELECTIVE EACH ADDITIONAL VESSEL  04/16/2020   IR ANGIOGRAM SELECTIVE EACH ADDITIONAL VESSEL  04/16/2020   IR THROMBECT PRIM MECH INIT (INCLU) MOD SED  04/16/2020   IR US GUIDE VASC ACCESS LEFT  04/16/2020   IR US GUIDE VASC ACCESS LEFT  04/16/2020   IR US GUIDE VASC ACCESS RIGHT  04/16/2020   RADIOLOGY WITH ANESTHESIA N/A 04/16/2020   Procedure: IR WITH ANESTHESIA- PULMONARY EMBOLUS INTERVENTION;  Surgeon: Suzette Battiest, MD;  Location: [facility];  Service: Radiology;  Laterality: N/A;   RIGHT/LEFT HEART CATH AND CORONARY ANGIOGRAPHY N/A 04/15/2020   Procedure: RIGHT/LEFT HEART CATH AND CORONARY ANGIOGRAPHY;  Surgeon: Jolaine Artist, MD;  Location: [facility];  Service: Cardiovascular;  Laterality: N/A;       Home Medications    Prior to Admission medications   Medication Sig Start Date End Date Taking? Authorizing Provider  allopurinol (ZYLOPRIM) 300 MG tablet Take 1 tablet (300 mg total) by mouth daily. 08/17/20  Yes Colon Branch, MD  amoxicillin-clavulanate (AUGMENTIN) 875-125 MG tablet Take 1 tablet by mouth every 12 (twelve) hours for 10 days. 02/16/21 02/26/21 Yes Eliezer Lofts, FNP  apixaban (ELIQUIS) 5 MG TABS tablet Take 1 tablet (5 mg total) by mouth  2 (two) times daily. 07/08/20  Yes Bensimhon, [name] Pascal, MD  carvedilol (COREG) 6.25 MG tablet Take 1 tablet (6.25 mg total) by mouth 2 (two) times daily with a meal. 12/10/20  Yes Bensimhon, [name] Pascal, MD  empagliflozin (JARDIANCE) 10 MG TABS tablet Take 1 tablet (10 mg total) by mouth daily. 06/11/20  Yes Bensimhon, [name] Pascal, MD  ENTRESTO 24-26 MG TAKE 1 TABLET BY MOUTH TWICE A DAY 10/26/20  Yes Bensimhon, [name] Pascal, MD  ivabradine (CORLANOR) 5 MG TABS tablet Take 1 tablet (5 mg total) by mouth 2 (two) times daily with a meal.  08/13/20  Yes Bensimhon, [name] Pascal, MD  pantoprazole (PROTONIX) 40 MG tablet TAKE 1 TABLET BY MOUTH EVERY DAY 09/15/20  Yes Paz, Alda Berthold, MD  predniSONE (DELTASONE) 20 MG tablet Take 3 tabs PO daily x 5 days. 02/16/21  Yes Eliezer Lofts, FNP  sertraline (ZOLOFT) 50 MG tablet Take 1 tablet (50 mg total) by mouth daily. 11/16/20  Yes Merian Capron, MD  sildenafil (VIAGRA) 50 MG tablet Take 1 tablet (50 mg total) by mouth daily as needed for erectile dysfunction. 12/10/20  Yes Bensimhon, [name] Pascal, MD  spironolactone (ALDACTONE) 25 MG tablet Take 0.5 tablets (12.5 mg total) by mouth daily. 04/24/20  Yes Bhagat, Bhavinkumar, PA  valACYclovir (VALTREX) 1000 MG tablet Take 1 tablet (1,000 mg total) by mouth 3 (three) times daily. 01/25/21  Yes Paz, Alda Berthold, MD  buPROPion (WELLBUTRIN) 100 MG tablet Take 1 tablet (100 mg total) by mouth 2 (two) times daily. 05/03/20 05/17/20  Colon Branch, MD    Family History Family History  Problem Relation Age of Onset   Cancer Mother        CERVICAL   Hypertension Father    Heart failure Father        no MI, d/t etoh   Diabetes Sister    Diabetes Sister    Diabetes Paternal Aunt    Diabetes Cousin    Colon cancer Neg Hx    Prostate cancer Neg Hx    Stroke Neg Hx    Esophageal cancer Neg Hx    Rectal cancer Neg Hx    Stomach cancer Neg Hx     Social History Social History   Tobacco Use   Smoking status: Never   Smokeless tobacco: Never  Vaping Use   Vaping Use: Never used  Substance Use Topics   Alcohol use: Not Currently    Comment: very rare    Drug use: No     Allergies   Pseudoephedrine   Review of Systems Review of Systems  HENT:  Positive for congestion, sinus pressure and sinus pain.   Respiratory:  Positive for cough.   All other systems reviewed and are negative.   Physical Exam Triage Vital Signs ED Triage Vitals  Enc Vitals Group     BP 02/16/21 1310 132/85     Pulse Rate 02/16/21 1310 (!) 56     Resp 02/16/21 1310 18      Temp 02/16/21 1310 98.9 F (37.2 C)     Temp Source 02/16/21 1310 Oral     SpO2 02/16/21 1310 99 %     Weight 02/16/21 1308 284 lb (128.8 kg)     Height 02/16/21 1308 _0  (1.854 m)     Head Circumference --      Peak Flow --      Pain Score 02/16/21 1308 0     Pain Loc --  Pain Edu? --      Excl. in [place]? --    No data found.  Updated Vital Signs BP 132/85 (BP Location: Left Arm)    Pulse (!) 56    Temp 98.9 F (37.2 C) (Oral)    Resp 18    Ht _0  (1.854 m)    Wt 284 lb (128.8 kg)    SpO2 99%    BMI 37.47 kg/m    Physical Exam Vitals and nursing note reviewed.  Constitutional:      General: He is not in acute distress.    Appearance: Normal appearance. He is obese. He is not ill-appearing.  HENT:     Right Ear: Tympanic membrane and external ear normal.     Left Ear: Tympanic membrane and external ear normal.     Ears:     Comments: Moderate eustachian tube dysfunction noted bilaterally    Nose:     Comments: Turbinates are erythematous/edematous    Mouth/Throat:     Mouth: Mucous membranes are moist.     Pharynx: Oropharynx is clear.     Comments: Mild to moderate clear drainage of posterior oropharynx noted Eyes:     Extraocular Movements: Extraocular movements intact.     Conjunctiva/sclera: Conjunctivae normal.     Pupils: Pupils are equal, round, and reactive to light.  Cardiovascular:     Rate and Rhythm: Normal rate and regular rhythm.     Pulses: Normal pulses.     Heart sounds: Normal heart sounds.  Pulmonary:     Effort: Pulmonary effort is normal.     Breath sounds: Normal breath sounds.  Musculoskeletal:        General: Normal range of motion.     Cervical back: Normal range of motion and neck supple.  Skin:    General: Skin is warm and dry.  Neurological:     General: No focal deficit present.     Mental Status: He is alert and oriented to person, place, and time.     UC Treatments / Results  Labs (all labs ordered are listed, but only  abnormal results are displayed) Labs Reviewed  POC SARS CORONAVIRUS 2 AG -  ED - Normal    EKG   Radiology No results found.  Procedures Procedures (including critical care time)  Medications Ordered in UC Medications - No data to display  Initial Impression / Assessment and Plan / UC Course  I have reviewed the triage vital signs and the nursing notes.  Pertinent labs & imaging results that were available during my care of the patient were reviewed by me and considered in my medical decision making (see chart for details).     MDM: 1.  Acute maxillary sinusitis, recurrence not specified-Rx'd Augmentin; 2.  Sinus pressure-Rx'd Prednisone. Advised/informed patient COVID-19 was negative this afternoon.  Advised patient to take medication as directed with food to completion.  Advised patient to take Prednisone with first dose of Augmentin for the next 5 of 10 days.  Encouraged patient to increase daily water intake while taking these medications. Final Clinical Impressions(s) / UC Diagnoses   Final diagnoses:  Acute maxillary sinusitis, recurrence not specified  Sinus pressure     Discharge Instructions      Advised/inform patient COVID-19 was negative this afternoon.  Advised patient to take medication as directed with food to completion.  Advised patient to take Prednisone with first dose of Augmentin for the next 5 of 10 days.  Encouraged patient  to increase daily water intake while taking these medications.     ED Prescriptions     Medication Sig Dispense Auth. Provider   amoxicillin-clavulanate (AUGMENTIN) 875-125 MG tablet Take 1 tablet by mouth every 12 (twelve) hours for 10 days. 20 tablet Eliezer Lofts, FNP   predniSONE (DELTASONE) 20 MG tablet Take 3 tabs PO daily x 5 days. 15 tablet Eliezer Lofts, FNP      PDMP not reviewed this encounter.   Eliezer Lofts, [place] 02/16/21 1421

## 2021-02-16 NOTE — Discharge Instructions (Addendum)
Advised/informed patient COVID-19 was negative this afternoon.  Advised patient to take medication as directed with food to completion.  Advised patient to take Prednisone with first dose of Augmentin for the next 5 of 10 days.  Encouraged patient to increase daily water intake while taking these medications.

## 2021-02-16 NOTE — ED Triage Notes (Signed)
Pt states that he has a cough, chest congestion, nasal congestion and facial pressure. X1 week  Pt states that he isn't vaccinated for covid. Pt states that he hasn't had flu vaccine.

## 2021-02-17 ENCOUNTER — Encounter: Payer: 59 | Admitting: Internal Medicine

## 2021-02-22 ENCOUNTER — Ambulatory Visit (INDEPENDENT_AMBULATORY_CARE_PROVIDER_SITE_OTHER): Payer: 59 | Admitting: Psychiatry

## 2021-02-22 ENCOUNTER — Encounter (HOSPITAL_COMMUNITY): Payer: Self-pay | Admitting: Psychiatry

## 2021-02-22 VITALS — BP 162/104 | Temp 98.3°F | Ht 73.0 in | Wt 293.0 lb

## 2021-02-22 DIAGNOSIS — F4323 Adjustment disorder with mixed anxiety and depressed mood: Secondary | ICD-10-CM | POA: Diagnosis not present

## 2021-02-22 DIAGNOSIS — F064 Anxiety disorder due to known physiological condition: Secondary | ICD-10-CM | POA: Diagnosis not present

## 2021-02-22 MED ORDER — SERTRALINE HCL 50 MG PO TABS: 75.0000 mg | ORAL_TABLET | Freq: Every day | ORAL | 0 refills | Status: DC

## 2021-02-22 NOTE — Progress Notes (Signed)
[place] Follow up visit    Patient Identification: MARGUES FILIPPINI MRN:  [id number] Date of Evaluation:  02/22/2021 Referral Source: primary care Chief Complaint:   follow up anxiety Visit Diagnosis:    ICD-10-CM   1. Adjustment disorder with mixed anxiety and depressed mood  F43.23     2. 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  He works full-time as an Administrator at [employer] but currently he is on disability because of his current medical comorbidity  Patient was diagnosed with congestive heart failure  during the hospital stay he developed lung clots.  Surgery was done to take care of it.  After that he developed fear of dying, that led to his referral   Endorsing anxiety, people or friends of his age died last year he knew 63 of them, recurrence of trigger and anxiety of health, dying  He does go gym when he can and at times if miss dose feels more concern   He has good support from wife and family   Aggravating factor: medical co morbidity, deaths of his friends, diagnosed with CCF and blood clots while in hospital  Modifying factor:family  Duration more then a year  Past Psychiatric History: denies prior to current medical condition  Previous Psychotropic Medications: Yes   Substance Abuse History in the last 12 months:  No.  Consequences of Substance Abuse: NA  Past Medical History:  Past Medical History:  Diagnosis Date   Allergy    Anxiety    Arthritis    CHF (congestive heart failure) ([place])    Fundic gland polyposis of stomach    GERD with esophagitis    Gout    Hx of adenomatous colonic polyps    Hypertension     Past Surgical History:  Procedure Laterality Date   COLONOSCOPY  05/2019   ESOPHAGOGASTRODUODENOSCOPY  05/2019   HAND SURGERY  Right    2006 for a FX   IR ANGIOGRAM PULMONARY BILATERAL SELECTIVE  04/16/2020   IR ANGIOGRAM SELECTIVE EACH ADDITIONAL VESSEL  04/16/2020   IR ANGIOGRAM SELECTIVE EACH ADDITIONAL VESSEL  04/16/2020   IR THROMBECT PRIM MECH INIT (INCLU) MOD SED  04/16/2020   IR US GUIDE VASC ACCESS LEFT  04/16/2020   IR US GUIDE [place] LEFT  04/16/2020   IR US GUIDE VASC ACCESS RIGHT  04/16/2020   RADIOLOGY WITH ANESTHESIA N/A 04/16/2020   Procedure: IR WITH ANESTHESIA- PULMONARY EMBOLUS INTERVENTION;  Surgeon: Suzette Battiest, MD;  Location: [facility];  Service: Radiology;  Laterality: N/A;   RIGHT/LEFT HEART CATH AND CORONARY ANGIOGRAPHY N/A 04/15/2020   Procedure: RIGHT/LEFT HEART CATH AND CORONARY ANGIOGRAPHY;  Surgeon: Jolaine Artist, MD;  Location: [facility];  Service: Cardiovascular;  Laterality: N/A;    Family Psychiatric History: Nephew: schizophrenia  Family History:  Family History  Problem Relation Age of Onset   Cancer Mother        CERVICAL   Hypertension Father    Heart failure Father        no MI, d/t etoh   Diabetes Sister    Diabetes Sister    Diabetes Paternal Aunt    Diabetes Cousin    Colon cancer Neg Hx    Prostate cancer Neg  Hx    Stroke Neg Hx    Esophageal cancer Neg Hx    Rectal cancer Neg Hx    Stomach cancer Neg Hx     Social History:   Social History   Socioeconomic History   Marital status: Married    Spouse name: Not on file   Number of children: 2   Years of education: Not on file   Highest education level: Some college, no degree  Occupational History   Occupation: works for [employer]  Tobacco Use   Smoking status: Never   Smokeless tobacco: Never  Vaping Use   Vaping Use: Never used  Substance and Sexual Activity   Alcohol use: Not Currently    Comment: very rare    Drug use: No   Sexual activity: Not on file  Other Topics Concern   Not on file  Social History Narrative   Household: pt, wife daughter (2013),  boy  (2006),    Social  Determinants of Radio broadcast assistant Strain: Not on file  Food Insecurity: Not on file  Transportation Needs: Not on file  Physical Activity: Not on file  Stress: Not on file  Social Connections: Not on file     Allergies:   Allergies  Allergen Reactions   Pseudoephedrine     REACTION: jittery    Metabolic Disorder Labs: Lab Results  Component Value Date   HGBA1C 5.6 08/17/2020   MPG 134.11 04/11/2020   No results found for: PROLACTIN Lab Results  Component Value Date   CHOL 212 (H) 08/17/2020   TRIG 123.0 08/17/2020   HDL 63.30 08/17/2020   CHOLHDL 3 08/17/2020   VLDL 24.6 08/17/2020   LDLCALC 124 (H) 08/17/2020   LDLCALC 108 (H) 12/03/2018   Lab Results  Component Value Date   TSH 2.246 04/12/2020    Therapeutic Level Labs: No results found for: LITHIUM No results found for: CBMZ No results found for: VALPROATE  Current Medications: Current Outpatient Medications  Medication Sig Dispense Refill   allopurinol (ZYLOPRIM) 300 MG tablet Take 1 tablet (300 mg total) by mouth daily. 90 tablet 3   amoxicillin-clavulanate (AUGMENTIN) 875-125 MG tablet Take 1 tablet by mouth every 12 (twelve) hours for 10 days. 20 tablet 0   apixaban (ELIQUIS) 5 MG TABS tablet Take 1 tablet (5 mg total) by mouth 2 (two) times daily. 180 tablet 1   carvedilol (COREG) 6.25 MG tablet Take 1 tablet (6.25 mg total) by mouth 2 (two) times daily with a meal. 60 tablet 6   empagliflozin (JARDIANCE) 10 MG TABS tablet Take 1 tablet (10 mg total) by mouth daily. 30 tablet 6   ENTRESTO 24-26 MG TAKE 1 TABLET BY MOUTH TWICE A DAY 60 tablet 6   ivabradine (CORLANOR) 5 MG TABS tablet Take 1 tablet (5 mg total) by mouth 2 (two) times daily with a meal. 180 tablet 1   pantoprazole (PROTONIX) 40 MG tablet TAKE 1 TABLET BY MOUTH EVERY DAY 90 tablet 1   predniSONE (DELTASONE) 20 MG tablet Take 3 tabs PO daily x 5 days. 15 tablet 0   sertraline (ZOLOFT) 50 MG tablet Take 1.5 tablets (75 mg total)  by mouth daily. 45 tablet 0   sildenafil (VIAGRA) 50 MG tablet Take 1 tablet (50 mg total) by mouth daily as needed for erectile dysfunction. 10 tablet 6   spironolactone (ALDACTONE) 25 MG tablet Take 0.5 tablets (12.5 mg total) by mouth daily. 30 tablet 6   valACYclovir (VALTREX) 1000  MG tablet Take 1 tablet (1,000 mg total) by mouth 3 (three) times daily. 21 tablet 0   No current facility-administered medications for this visit.     Psychiatric Specialty Exam: Review of Systems  Cardiovascular:  Negative for chest pain.  Neurological:  Negative for tremors.  Psychiatric/Behavioral:  Negative for agitation. The patient is nervous/anxious.    Blood pressure (!) 162/104, temperature 98.3 F (36.8 C), height 6\' 1"  (1.854 m), weight 293 lb (132.9 kg).Body mass index is 38.66 kg/m.  General Appearance: Casual  Eye Contact:  Fair  Speech:  Normal Rate  Volume:  Normal  Mood:  stress, anxiety  Affect:  Congruent  Thought Process:  Goal Directed  Orientation:  Full (Time, Place, and Person)  Thought Content:  Rumination  Suicidal Thoughts:  No  Homicidal Thoughts:  No  Memory:  Immediate;   Fair Recent;   Fair  Judgement:  Fair  Insight:  Fair  Psychomotor Activity:  Normal  Concentration:  Concentration: Fair and Attention Span: Fair  Recall:  Good  Fund of Knowledge:Good  Language: Fair  Akathisia:  No  Handed:  AIMS (if indicated):  not done  Assets:  Communication Skills Desire for Improvement  ADL's:  Intact  Cognition: WNL  Sleep:   variable   Screenings: IT sales professional Office Visit from 01/25/2021 in [facility] at [place] from 09/22/2020 in [facility] Office Visit from 08/17/2020 in [facility] [place] from 08/03/2020 in [facility] Video Visit from 05/17/2020 in  [place]  PHQ-2 Total Score 0 0 0 1 1  PHQ-9 Total Score 0 -- 0 -- [place] Office Visit from 02/22/2021 in [facility] ED from 02/16/2021 in [facility] Visit from 11/16/2020 in [facility] No Risk No Risk No Risk       Assessment and Plan:   Prior documentation reviewed  Adjustment disorder with depressed mood and anxiety:  Endorses anxiety and fear, increase zoloft to 75mg    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

## 2021-03-10 ENCOUNTER — Other Ambulatory Visit (HOSPITAL_COMMUNITY): Payer: Self-pay

## 2021-03-10 ENCOUNTER — Telehealth (HOSPITAL_COMMUNITY): Payer: Self-pay

## 2021-03-10 MED ORDER — EMPAGLIFLOZIN 10 MG PO TABS: 10.0000 mg | ORAL_TABLET | Freq: Every day | ORAL | 3 refills | Status: DC

## 2021-03-10 MED ORDER — SERTRALINE HCL 50 MG PO TABS: 75.0000 mg | ORAL_TABLET | Freq: Every day | ORAL | 0 refills | Status: DC

## 2021-03-10 NOTE — Addendum Note (Signed)
Addended by: Merian Capron on: 03/10/2021 03:09 PM   Modules accepted: Orders

## 2021-03-10 NOTE — Telephone Encounter (Signed)
90 day prescription request was received via fax requesting 90 day refills on Sertraline HCL 50 MG tablet to be sent to [facility].

## 2021-03-21 ENCOUNTER — Other Ambulatory Visit: Payer: Self-pay | Admitting: Internal Medicine

## 2021-03-21 DIAGNOSIS — K219 Gastro-esophageal reflux disease without esophagitis: Secondary | ICD-10-CM

## 2021-03-22 ENCOUNTER — Other Ambulatory Visit: Payer: Self-pay | Admitting: Physician Assistant

## 2021-03-22 NOTE — Telephone Encounter (Signed)
This is a CHF pt 

## 2021-04-21 ENCOUNTER — Other Ambulatory Visit: Payer: Self-pay | Admitting: Internal Medicine

## 2021-04-25 ENCOUNTER — Encounter: Payer: 59 | Admitting: Internal Medicine

## 2021-04-27 ENCOUNTER — Other Ambulatory Visit (HOSPITAL_COMMUNITY): Payer: Self-pay | Admitting: Internal Medicine

## 2021-05-05 ENCOUNTER — Ambulatory Visit (HOSPITAL_COMMUNITY): Payer: 59 | Admitting: Psychiatry

## 2021-05-17 ENCOUNTER — Encounter: Payer: 59 | Admitting: Internal Medicine

## 2021-05-24 ENCOUNTER — Encounter: Payer: Self-pay | Admitting: Internal Medicine

## 2021-06-09 ENCOUNTER — Ambulatory Visit: Payer: 59 | Admitting: Cardiology

## 2021-06-10 ENCOUNTER — Other Ambulatory Visit (HOSPITAL_COMMUNITY): Payer: Self-pay | Admitting: Psychiatry

## 2021-07-15 ENCOUNTER — Telehealth (INDEPENDENT_AMBULATORY_CARE_PROVIDER_SITE_OTHER): Payer: Self-pay | Admitting: Cardiology

## 2021-07-15 ENCOUNTER — Telehealth: Payer: Self-pay | Admitting: *Deleted

## 2021-07-15 VITALS — BP 123/74 | HR 74 | Ht 73.0 in | Wt 300.0 lb

## 2021-07-15 DIAGNOSIS — G4719 Other hypersomnia: Secondary | ICD-10-CM

## 2021-07-15 DIAGNOSIS — I10 Essential (primary) hypertension: Secondary | ICD-10-CM

## 2021-07-15 NOTE — Telephone Encounter (Signed)
Split night order is in and will precert it.

## 2021-07-15 NOTE — Progress Notes (Signed)
Virtual Sleep Medicine Note via Video Note   This visit type was conducted due to national recommendations for restrictions regarding the COVID-19 Pandemic (e.g. social distancing) in an effort to limit this patient's exposure and mitigate transmission in our community.  Due to his co-morbid illnesses, this patient is at least at moderate risk for complications without adequate follow up.  This format is felt to be most appropriate for this patient at this time.  All issues noted in this document were discussed and addressed.  A limited physical exam was performed with this format.  Please refer to the patient's chart for his consent to telehealth for [facility].   Evaluation Performed:  Sleep Medicine Consult  Date:  07/15/2021   ID:  Kevin Baxter, DOB 12/25/1970, MRN [id number]  Patient Location:  Home  Provider location:   [place]  PCP:  Colon Branch, MD  Sleep Medicine:  Fransico Him, MD Electrophysiologist:  None   Chief Complaint:  OSA  History of Present Illness:    Kevin Baxter is a 51 y.o. male who presents via audio/video conferencing for a telehealth visit today in referral by Dr. Larose Kells for evaluation of possible sleep apnea.   This is a 51yo male with a hx of HTN and GERD who is referred for evaluation of possible OSA.  He has morbid obesity, snoring, excessive daytime sleepiness with Epworth sleepiness sore of 16.  At the time of his evaluation in 2021 I ordered a split-night sleep study but this was not done because he started having problems with CHF and was referred to Dr. Haroldine Laws.  He is now referred back by Dr. Larose Kells and to be evaluated for possible sleep apnea.  He tells me that he feels sleepy during the day but has attributed that to not going to be early enough. He says that he has been told that he snores but never wakes himself up snoring or gasping for breath.   The patient does not have symptoms concerning for COVID-19 infection (fever, chills,  cough, or new shortness of breath).    Prior CV studies:   The following studies were reviewed today:  none  Past Medical History:  Diagnosis Date   Allergy    Anxiety    Arthritis    CHF (congestive heart failure) ([place])    Fundic gland polyposis of stomach    GERD with esophagitis    Gout    Hx of adenomatous colonic polyps    Hypertension    Past Surgical History:  Procedure Laterality Date   COLONOSCOPY  05/2019   ESOPHAGOGASTRODUODENOSCOPY  05/2019   HAND SURGERY Right    2006 for a FX   IR ANGIOGRAM PULMONARY BILATERAL SELECTIVE  04/16/2020   IR ANGIOGRAM SELECTIVE EACH ADDITIONAL VESSEL  04/16/2020   IR ANGIOGRAM SELECTIVE EACH ADDITIONAL VESSEL  04/16/2020   IR THROMBECT PRIM MECH INIT (INCLU) MOD SED  04/16/2020   IR US GUIDE VASC ACCESS LEFT  04/16/2020   IR US GUIDE VASC ACCESS LEFT  04/16/2020   IR US GUIDE VASC ACCESS RIGHT  04/16/2020   RADIOLOGY WITH ANESTHESIA N/A 04/16/2020   Procedure: IR WITH ANESTHESIA- PULMONARY EMBOLUS INTERVENTION;  Surgeon: Suzette Battiest, MD;  Location: [facility];  Service: Radiology;  Laterality: N/A;   RIGHT/LEFT HEART CATH AND CORONARY ANGIOGRAPHY N/A 04/15/2020   Procedure: RIGHT/LEFT HEART CATH AND CORONARY ANGIOGRAPHY;  Surgeon: Jolaine Artist, MD;  Location: [place] [facility];  Service: Cardiovascular;  Laterality: N/A;     Current Meds  Medication Sig   allopurinol (ZYLOPRIM) 300 MG tablet Take 1 tablet (300 mg total) by mouth daily.   carvedilol (COREG) 6.25 MG tablet TAKE 1 TABLET BY MOUTH 2 TIMES DAILY WITH A MEAL.   ELIQUIS 5 MG TABS tablet TAKE 1 TABLET BY MOUTH TWICE A DAY   empagliflozin (JARDIANCE) 10 MG TABS tablet Take 1 tablet (10 mg total) by mouth daily.   ENTRESTO 24-26 MG TAKE 1 TABLET BY MOUTH TWICE A DAY   ivabradine (CORLANOR) 5 MG TABS tablet Take 1 tablet (5 mg total) by mouth 2 (two) times daily with a meal.   sertraline (ZOLOFT) 50 MG tablet Take 1.5 tablets (75 mg total) by mouth daily.   spironolactone  (ALDACTONE) 25 MG tablet TAKE 1/2 TABLET BY MOUTH EVERY DAY     Allergies:   Pseudoephedrine   Social History   Tobacco Use   Smoking status: Never   Smokeless tobacco: Never  Vaping Use   Vaping Use: Never used  Substance Use Topics   Alcohol use: Not Currently    Comment: very rare    Drug use: No     Family Hx: The patient's family history includes Cancer in his mother; Diabetes in his cousin, paternal aunt, sister, and sister; Heart failure in his father; Hypertension in his father. There is no history of Colon cancer, Prostate cancer, Stroke, Esophageal cancer, Rectal cancer, or Stomach cancer.  ROS:   Please see the history of present illness.     All other systems reviewed and are negative.   Labs/Other Tests and Data Reviewed:    Recent Labs: 12/10/2020: ALT 19; B Natriuretic Peptide 25.3; BUN 15; Creatinine, Ser 1.27; Hemoglobin 18.2; Platelets 228; Potassium 4.8; Sodium 138   Recent Lipid Panel Lab Results  Component Value Date/Time   CHOL 212 (H) 08/17/2020 10:55 AM   TRIG 123.0 08/17/2020 10:55 AM   TRIG 69 11/22/2005 10:16 AM   HDL 63.30 08/17/2020 10:55 AM   CHOLHDL 3 08/17/2020 10:55 AM   LDLCALC 124 (H) 08/17/2020 10:55 AM    Wt Readings from Last 3 Encounters:  07/15/21 300 lb (136.1 kg)  02/16/21 284 lb (128.8 kg)  01/25/21 291 lb 2 oz (132.1 kg)     Objective:    Vital Signs:  BP 123/74   Pulse 74   Ht '6\' 1"'$  (1.854 m)   Wt 300 lb (136.1 kg)   BMI 39.58 kg/m    Well nourished, well developed male in no acute distress. Well appearing, alert and conversant, regular work of breathing,  good skin color  Eyes- anicteric mouth- oral mucosa is pink  neuro- grossly intact skin- no apparent rash or lesions or cyanosis  ASSESSMENT & PLAN:    1.  Excessive daytime sleepiness -He was post to have a split-night sleep study done 2 years ago and never followed up -I can order the split-night sleep study to determine if he has obstructive sleep  apnea and if so get him on CPAP therapy.  2. HTN -BP controlled on exam today continue Losartan '100mg'$  daily  COVID-19 Education: The signs and symptoms of COVID-19 were discussed with the patient and how to seek care for testing (follow up with PCP or arrange E-visit).  The importance of social distancing was discussed today.  Patient Risk:   After full review of this patient's clinical status, I feel that they are at least moderate risk at this time.  Time:  Today, I have spent 20 minutes on telemdicine discussing medical problems including excessive daytime sleepiness, Obesity and HTN and reviewing patient's chart including OV notes from PCP.Marland Kitchen  Medication Adjustments/Labs and Tests Ordered: Current medicines are reviewed at length with the patient today.  Concerns regarding medicines are outlined above.  Tests Ordered: No orders of the defined types were placed in this encounter.  Medication Changes: No orders of the defined types were placed in this encounter.   Disposition:  Follow up prn  Signed, Fransico Him, MD  07/15/2021 9:32 AM    [place]

## 2021-07-15 NOTE — Patient Instructions (Signed)
Medication Instructions:  Your physician recommends that you continue on your current medications as directed. Please refer to the Current Medication list given to you today.  *If you need a refill on your cardiac medications before your next appointment, please call your pharmacy*   Lab Work: none If you have labs (blood work) drawn today and your tests are completely normal, you will receive your results only by: [place] (if you have MyChart) OR A paper copy in the mail If you have any lab test that is abnormal or we need to change your treatment, we will call you to review the results.   Testing/Procedures: Your physician has recommended that you have a sleep study. This test records several body functions during sleep, including: brain activity, eye movement, oxygen and carbon dioxide blood levels, heart rate and rhythm, breathing rate and rhythm, the flow of air through your mouth and nose, snoring, body muscle movements, and chest and belly movement.    Follow-Up: At [facility], you and your health needs are our priority.  As part of our continuing mission to provide you with exceptional heart care, we have created designated Provider Care Teams.  These Care Teams include your primary Cardiologist (physician) and Advanced Practice Providers (APPs -  Physician Assistants and Nurse Practitioners) who all work together to provide you with the care you need, when you need it.  We recommend signing up for the patient portal called "MyChart".  Sign up information is provided on this After Visit Summary.  MyChart is used to connect with patients for Virtual Visits (Telemedicine).  Patients are able to view lab/test results, encounter notes, upcoming appointments, etc.  Non-urgent messages can be sent to your provider as well.   To learn more about what you can do with MyChart, go to [email or web address].    Your next appointment:   To be arranged after sleep study  The  format for your next appointment:   Virtual Visit   Provider:  Dr Radford Pax   If primary card or EP is not listed click here to update    :1}    Other Instructions   Important Information About Sugar

## 2021-07-15 NOTE — Telephone Encounter (Signed)
Per Dr Radford Pax,  order a split-night sleep study ASAP.  Have Gae Bon call the sleep lab and tell him this needs to be done ASAP and then will do follow-up after that

## 2021-07-25 ENCOUNTER — Telehealth: Payer: Self-pay | Admitting: Cardiology

## 2021-07-25 ENCOUNTER — Other Ambulatory Visit (HOSPITAL_COMMUNITY): Payer: Self-pay

## 2021-07-25 ENCOUNTER — Telehealth (HOSPITAL_COMMUNITY): Payer: Self-pay | Admitting: Pharmacy Technician

## 2021-07-25 DIAGNOSIS — I10 Essential (primary) hypertension: Secondary | ICD-10-CM

## 2021-07-25 DIAGNOSIS — G4719 Other hypersomnia: Secondary | ICD-10-CM

## 2021-07-25 DIAGNOSIS — K219 Gastro-esophageal reflux disease without esophagitis: Secondary | ICD-10-CM

## 2021-07-25 MED ORDER — IVABRADINE HCL 5 MG PO TABS: 5.0000 mg | ORAL_TABLET | Freq: Two times a day (BID) | ORAL | 0 refills | Status: DC

## 2021-07-25 MED ORDER — IVABRADINE HCL 5 MG PO TABS: 5.0000 mg | ORAL_TABLET | Freq: Two times a day (BID) | ORAL | 1 refills | Status: DC

## 2021-07-25 MED ORDER — PANTOPRAZOLE SODIUM 40 MG PO TBEC: 40.0000 mg | DELAYED_RELEASE_TABLET | Freq: Every day | ORAL | 0 refills | Status: DC

## 2021-07-25 NOTE — Telephone Encounter (Signed)
Patient Advocate Encounter   Received notification from [employer] that prior authorization for Corlanor is required.   PA submitted on CoverMyMeds Key [id number] Status is pending   Will continue to follow.

## 2021-07-28 NOTE — Telephone Encounter (Signed)
Advanced Heart Failure Patient Advocate Encounter  Prior Authorization for Corlanor has been approved.    PA#  [id number] Effective dates: 07/25/21 through 07/26/22  Charlann Boxer, CPhT

## 2021-08-04 NOTE — Telephone Encounter (Signed)
DENIED NOT MEDICALLY NECESSARY # [id number]/ HST SUGGESTED 6/21  Your case has pended. Tracking #: [id number]

## 2021-08-05 DIAGNOSIS — N486 Induration penis plastica: Secondary | ICD-10-CM | POA: Insufficient documentation

## 2021-08-10 NOTE — Telephone Encounter (Signed)
Order placed for itamar study.

## 2021-08-10 NOTE — Telephone Encounter (Signed)
Per Dr Radford Pax, [place] to do Itamar sleep study

## 2021-08-10 NOTE — Addendum Note (Signed)
Addended by: Freada Bergeron on: 08/10/2021 04:11 PM   Modules accepted: Orders

## 2021-08-15 ENCOUNTER — Telehealth: Payer: Self-pay | Admitting: Cardiology

## 2021-08-15 NOTE — Telephone Encounter (Signed)
Patient is calling stating there is some confusion between our office and [employer]. He reports that his insurance denied the sleep study requested. He states that one was ordered in the past, but was never done. [employer] advised the patient they feel there needs to be a peer to peer conversation between them and Dr. Radford Pax in order to get this sorted out. Patient is requesting a callback to discuss further. Please advise.

## 2021-08-17 NOTE — Telephone Encounter (Signed)
Reached out to the patient and informed him that his insurance approved a home sleep study and if that comes back positive then he will test for an in lab study. Pt is agreeable.

## 2021-08-19 NOTE — Telephone Encounter (Signed)
I left a message for the pt to call back and ask to s/w Arbie Cookey with Home Sleep Studies. Looks like the pt has been approved. Stopbang will need to be done still. Pt will need to come by the office for set up.

## 2021-08-19 NOTE — Telephone Encounter (Signed)
Pt called back and will come by the office 08/23/21 @ 11 am for me to set up Itamar with him Pt is already approved. I will provide the pt with the PIN # when he comes in. Pt will need Stop bang to be done.

## 2021-08-20 ENCOUNTER — Other Ambulatory Visit (HOSPITAL_COMMUNITY): Payer: Self-pay | Admitting: Internal Medicine

## 2021-08-20 DIAGNOSIS — K219 Gastro-esophageal reflux disease without esophagitis: Secondary | ICD-10-CM

## 2021-08-23 NOTE — Telephone Encounter (Signed)
Pt came by the office today and has been set up with Itamar study. Pt has been approved, so therefor was give his PIN # [id number] and ok to proceed with the study. Pt agreeable to signed waiver. Pt states he will do the sleep study sometime between tonight and next week. I said that is fine. We will let him now when the results are in and of any recommendations. Pt thanked me for my time today.   Called and made the patient aware that he may proceed with the [facility] Sleep Study. PIN # provided to the patient. Patient made aware that he will be contacted after the test has been read with the results and any recommendations. Patient verbalized understanding and thanked me for the call.       Sleep Apnea Evaluation  [facility]  Today's Date: 08/23/2021   Patient Name: Kevin Baxter        DOB: 03/26/1970       Height:        Weight:    BMI: There is no height or weight on file to calculate BMI.    Referring Provider:     STOP-BANG RISK ASSESSMENT  he     08/23/2021   11:28 AM  STOP-BANG  Do you snore loudly? Yes  Do you often feel tired, fatigued, or sleepy during the daytime? Yes  Has anyone observed you stop breathing during sleep? No  Do you have (or are you being treated for) high blood pressure? Yes  Recent BMI (Calculated) 39.59  Is BMI greater than 35 kg/m2? 1=Yes  Age older than 53 years old? 1=Yes  Has large neck size > 40 cm (15.7 in, large male shirt size, large male collar size > 16) Yes  Gender - Male 1=Yes  STOP-Bang Total Score 7      If STOP-BANG Score ?3 OR two clinical symptoms - patient qualifies for WatchPAT (CPT 95800)      Sleep study ordered due to two (2) of the following clinical symptoms/diagnoses:  Excessive daytime sleepiness G47.10  Gastroesophageal reflux K21.9  Nocturia R35.1  Morning Headaches G44.221  Difficulty concentrating R41.840  Memory problems or poor judgment G31.84  Personality changes or irritability  R45.4  Loud snoring R06.83  Depression F32.9  Unrefreshed by sleep G47.8  Impotence N52.9  History of high blood pressure R03.0  Insomnia G47.00  Sleep Disordered Breathing or Sleep Apnea ICD G47.33

## 2021-08-23 NOTE — Telephone Encounter (Signed)
Sleep Apnea Evaluation  [facility]  Today's Date: 08/23/2021   Patient Name: Kevin Baxter        DOB: 07-13-1970       Height:        Weight:    BMI: There is no height or weight on file to calculate BMI.    Referring Provider:     STOP-BANG RISK ASSESSMENT         If STOP-BANG Score ?3 OR two clinical symptoms - patient qualifies for WatchPAT (CPT 95800)      Sleep study ordered due to two (2) of the following clinical symptoms/diagnoses:  Excessive daytime sleepiness G47.10  Gastroesophageal reflux K21.9  Nocturia R35.1  Morning Headaches G44.221  Difficulty concentrating R41.840  Memory problems or poor judgment G31.84  Personality changes or irritability R45.4  Loud snoring R06.83  Depression F32.9  Unrefreshed by sleep G47.8  Impotence N52.9  History of high blood pressure R03.0  Insomnia G47.00  Sleep Disordered Breathing or Sleep Apnea ICD G47.33

## 2021-08-29 ENCOUNTER — Encounter (INDEPENDENT_AMBULATORY_CARE_PROVIDER_SITE_OTHER): Payer: 59 | Admitting: Cardiology

## 2021-08-29 DIAGNOSIS — G4733 Obstructive sleep apnea (adult) (pediatric): Secondary | ICD-10-CM | POA: Diagnosis not present

## 2021-09-02 NOTE — Telephone Encounter (Signed)
This has been done already

## 2021-09-03 NOTE — Procedures (Signed)
   SLEEP STUDY REPORT Patient Information Study Date: 08/29/21 Patient Name: Kevin Baxter Patient ID: [id number] Birth Date: 06-Nov-2070 Age: 51 Gender: Male BMI: 39.7 (W=300 lb, H=6' 1'') Stopbang: 7 Referring Physician: Fransico Him, MD  TEST DESCRIPTION: Home sleep apnea testing was completed using the WatchPat, a Type 1 device, utilizing peripheral arterial tonometry (PAT), chest movement, actigraphy, pulse oximetry, pulse rate, body position and snore. AHI was calculated with apnea and hypopnea using valid sleep time as the denominator. RDI includes apneas, hypopneas, and RERAs. The data acquired and the scoring of sleep and all associated events were performed in accordance with the recommended standards and specifications as outlined in the AASM Manual for the Scoring of Sleep and Associated Events 2.2.0 (2015).  FINDINGS: 1. Moderate Obstructive Sleep Apnea with AHI 20.2/hr. 2. No Central Sleep Apnea with pAHIc 1.1/hr. 3. Oxygen desaturations as low as 83%. 4. Mild snoring was present. O2 sats were < 88% for 3.8 min. 5. Total sleep time was 5 hrs and 33 min. 6. 17.7% of total sleep time was spent in REM sleep. 7. Shortened sleep onset latency at 6 min 8. Prolonged REM sleep onset latency at 133 min. 9. Total awakenings were 21.  DIAGNOSIS: Moderate Obstructive Sleep Apnea (G47.33)  RECOMMENDATIONS: 1. Clinical correlation of these findings is necessary. The decision to treat obstructive sleep apnea (OSA) is usually based on the presence of apnea symptoms or the presence of associated medical conditions such as Hypertension, Congestive Heart Failure, Atrial Fibrillation or Obesity. The most common symptoms of OSA are snoring, gasping for breath while sleeping, daytime sleepiness and fatigue.  2. Initiating apnea therapy is recommended given the presence of symptoms and/or associated conditions. Recommend proceeding with one of the following:   a. Auto-CPAP therapy with  a pressure range of 5-20cm H2O.   b. An oral appliance (OA) that can be obtained from certain dentists with expertise in sleep medicine. These are primarily of use in non-obese patients with mild and moderate disease.   c. An ENT consultation which may be useful to look for specific causes of obstruction and possible treatment options.   d. If patient is intolerant to PAP therapy, consider referral to ENT for evaluation for hypoglossal nerve stimulator.  3. Close follow-up is necessary to ensure success with CPAP or oral appliance therapy for maximum benefit .  4. A follow-up oximetry study on CPAP is recommended to assess the adequacy of therapy and determine the need for supplemental oxygen or the potential need for Bi-level therapy. An arterial blood gas to determine the adequacy of baseline ventilation and oxygenation should also be considered.  5. Healthy sleep recommendations include: adequate nightly sleep (normal 7-9 hrs/night), avoidance of caffeine after noon and alcohol near bedtime, and maintaining a sleep environment that is cool, dark and quiet.  6. Weight loss for overweight patients is recommended. Even modest amounts of weight loss can significantly improve the severity of sleep apnea.  7. Snoring recommendations include: weight loss where appropriate, side sleeping, and avoidance of alcohol before bed.  8. Operation of motor vehicle should not be performed when sleepy.  Signature: Fransico Him, MD; [facility]; [place], [place] Board of Sleep Medicine Electronically Signed: 09/04/21

## 2021-09-04 ENCOUNTER — Ambulatory Visit: Payer: Self-pay

## 2021-09-04 DIAGNOSIS — G4719 Other hypersomnia: Secondary | ICD-10-CM

## 2021-09-04 DIAGNOSIS — I10 Essential (primary) hypertension: Secondary | ICD-10-CM

## 2021-09-07 ENCOUNTER — Other Ambulatory Visit (HOSPITAL_COMMUNITY): Payer: Self-pay | Admitting: Psychiatry

## 2021-09-07 ENCOUNTER — Other Ambulatory Visit: Payer: Self-pay | Admitting: Internal Medicine

## 2021-09-15 ENCOUNTER — Telehealth: Payer: Self-pay | Admitting: Cardiology

## 2021-09-15 ENCOUNTER — Telehealth: Payer: Self-pay | Admitting: *Deleted

## 2021-09-15 DIAGNOSIS — I509 Heart failure, unspecified: Secondary | ICD-10-CM

## 2021-09-15 DIAGNOSIS — I5022 Chronic systolic (congestive) heart failure: Secondary | ICD-10-CM

## 2021-09-15 DIAGNOSIS — G4733 Obstructive sleep apnea (adult) (pediatric): Secondary | ICD-10-CM

## 2021-09-15 DIAGNOSIS — I10 Essential (primary) hypertension: Secondary | ICD-10-CM

## 2021-09-15 NOTE — Telephone Encounter (Signed)
The patient has been notified of the result and verbalized understanding.  All questions (if any) were answered. Marolyn Hammock, CMA 09/15/2021 7:01 PM    Will precert titration

## 2021-09-15 NOTE — Telephone Encounter (Signed)
Patient calling to follow up on what the next step is since he had his sleep study.

## 2021-09-15 NOTE — Telephone Encounter (Signed)
-----   Message from Lauralee Evener, Normal sent at 09/05/2021  8:19 AM EDT -----  ----- Message ----- From: Sueanne Margarita, MD Sent: 09/03/2021   8:44 PM EDT To: Cv Div Sleep Studies  Please let patient know that they have sleep apnea.  Recommend therapeutic CPAP titration for treatment of patient's sleep disordered breathing.  If unable to perform an in lab titration then initiate ResMed auto CPAP from 4 to 15cm H2O with heated humidity and mask of choice and overnight pulse ox on CPAP.

## 2021-09-17 ENCOUNTER — Other Ambulatory Visit: Payer: Self-pay | Admitting: Internal Medicine

## 2021-09-21 NOTE — Addendum Note (Signed)
Addended by: Freada Bergeron on: 09/21/2021 06:13 PM   Modules accepted: Orders

## 2021-09-21 NOTE — Telephone Encounter (Signed)
Prior Authorization for TITRATION sent to [facility] via web portal. Tracking Number .  8/23 DENIED AUTH # [id number] 8/17 PENDED Tracking #:[id number] Appeals # [phone].   If unable to perform an in lab titration then initiate ResMed auto CPAP from 4 to 15cm H2O with heated humidity and mask of choice and overnight pulse ox on CPAP.       Upon patient request DME selection is [facility].  Patient understands he will be contacted by [facility] to set up his cpap. Patient understands to call if [place] does not contact him with new setup in a timely manner. Patient understands they will be called once confirmation has been received from [facility]/ that they have received their new machine to schedule 10 week follow up appointment.   [place] notified of new cpap order  Please add to airview Patient was grateful for the call and thanked me.

## 2021-09-22 NOTE — Telephone Encounter (Signed)
Called patient.Pt is agreeable to treatment.

## 2021-09-28 ENCOUNTER — Encounter: Payer: Self-pay | Admitting: Internal Medicine

## 2021-10-31 ENCOUNTER — Other Ambulatory Visit (HOSPITAL_COMMUNITY): Payer: Self-pay | Admitting: Internal Medicine

## 2021-10-31 DIAGNOSIS — K219 Gastro-esophageal reflux disease without esophagitis: Secondary | ICD-10-CM

## 2021-11-11 ENCOUNTER — Other Ambulatory Visit (HOSPITAL_COMMUNITY): Payer: Self-pay | Admitting: Psychiatry

## 2021-11-22 ENCOUNTER — Ambulatory Visit (HOSPITAL_COMMUNITY): Payer: 59 | Admitting: Psychiatry

## 2021-11-22 ENCOUNTER — Encounter (HOSPITAL_COMMUNITY): Payer: Self-pay | Admitting: Psychiatry

## 2021-11-22 VITALS — BP 112/80 | HR 74 | Temp 98.8°F | Ht 73.0 in | Wt 318.0 lb

## 2021-11-22 DIAGNOSIS — F064 Anxiety disorder due to known physiological condition: Secondary | ICD-10-CM

## 2021-11-22 DIAGNOSIS — F4323 Adjustment disorder with mixed anxiety and depressed mood: Secondary | ICD-10-CM | POA: Diagnosis not present

## 2021-11-22 MED ORDER — SERTRALINE HCL 50 MG PO TABS: 75.0000 mg | ORAL_TABLET | Freq: Every day | ORAL | 0 refills | Status: DC

## 2021-11-22 NOTE — Progress Notes (Signed)
[place] Follow up visit    Patient Identification: Kevin Baxter MRN:  [id number] Date of Evaluation:  11/22/2021 Referral Source: primary care Chief Complaint:   follow up anxiety Visit Diagnosis:    ICD-10-CM   1. Adjustment disorder with mixed anxiety and depressed mood  F43.23     2. 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  He works full-time as an Administrator at [employer] but currently he is on disability because of his current medical comorbidity  Patient was diagnosed with congestive heart failure  during the hospital stay he developed lung clots.  Surgery was done to take care of it.  After that he developed fear of dying, that led to his referral  Last visit been more then 9 months, apparently he was doing better on zoloft and then ran out and felt he could do without it, but after few weeks now feeling somewhat anxious, wife got worried and he felt need to get back on meds Was getting anxious and impatient at times    He has good support from wife and family   Aggravating factor: medical co morbidity, deaths of his friends, diagnosed with CCF and blood clots while in hospital  Modifying factor:family  Duration 2years  Past Psychiatric History: denies prior to current medical condition  Previous Psychotropic Medications: Yes   Substance Abuse History in the last 12 months:  No.  Consequences of Substance Abuse: NA  Past Medical History:  Past Medical History:  Diagnosis Date   Allergy    Anxiety    Arthritis    CHF (congestive heart failure) ([place])    Fundic gland polyposis of stomach    GERD with esophagitis    Gout    Hx of adenomatous colonic polyps    Hypertension     Past Surgical History:  Procedure Laterality Date   COLONOSCOPY   05/2019   ESOPHAGOGASTRODUODENOSCOPY  05/2019   HAND SURGERY Right    2006 for a FX   IR ANGIOGRAM PULMONARY BILATERAL SELECTIVE  04/16/2020   IR ANGIOGRAM SELECTIVE EACH ADDITIONAL VESSEL  04/16/2020   IR ANGIOGRAM SELECTIVE EACH ADDITIONAL VESSEL  04/16/2020   IR THROMBECT PRIM MECH INIT (INCLU) MOD SED  04/16/2020   IR US GUIDE VASC ACCESS LEFT  04/16/2020   IR US GUIDE [place] LEFT  04/16/2020   IR US GUIDE VASC ACCESS RIGHT  04/16/2020   RADIOLOGY WITH ANESTHESIA N/A 04/16/2020   Procedure: IR WITH ANESTHESIA- PULMONARY EMBOLUS INTERVENTION;  Surgeon: Suzette Battiest, MD;  Location: [place];  Service: Radiology;  Laterality: N/A;   RIGHT/LEFT HEART CATH AND CORONARY ANGIOGRAPHY N/A 04/15/2020   Procedure: RIGHT/LEFT HEART CATH AND CORONARY ANGIOGRAPHY;  Surgeon: Jolaine Artist, MD;  Location: [place] CV LAB;  Service: Cardiovascular;  Laterality: N/A;    Family Psychiatric History: Nephew: schizophrenia  Family History:  Family History  Problem Relation Age of Onset   Cancer Mother        CERVICAL   Hypertension Father    Heart failure Father        no MI, d/t etoh   Diabetes Sister    Diabetes Sister    Diabetes Paternal Aunt    Diabetes Cousin    Colon cancer  Neg Hx    Prostate cancer Neg Hx    Stroke Neg Hx    Esophageal cancer Neg Hx    Rectal cancer Neg Hx    Stomach cancer Neg Hx     Social History:   Social History   Socioeconomic History   Marital status: Married    Spouse name: Not on file   Number of children: 2   Years of education: Not on file   Highest education level: Some college, no degree  Occupational History   Occupation: works for [employer]  Tobacco Use   Smoking status: Never   Smokeless tobacco: Never  Vaping Use   Vaping Use: Never used  Substance and Sexual Activity   Alcohol use: Not Currently    Comment: very rare    Drug use: No   Sexual activity: Not on file  Other Topics Concern   Not on file  Social History Narrative    Household: pt, wife daughter (2013),  boy  (2006),    Social Determinants of Radio broadcast assistant Strain: Not on file  Food Insecurity: Not on file  Transportation Needs: Not on file  Physical Activity: Not on file  Stress: Not on file  Social Connections: Not on file     Allergies:   Allergies  Allergen Reactions   Pseudoephedrine     REACTION: jittery    Metabolic Disorder Labs: Lab Results  Component Value Date   HGBA1C 5.6 08/17/2020   MPG 134.11 04/11/2020   No results found for: "PROLACTIN" Lab Results  Component Value Date   CHOL 212 (H) 08/17/2020   TRIG 123.0 08/17/2020   HDL 63.30 08/17/2020   CHOLHDL 3 08/17/2020   VLDL 24.6 08/17/2020   LDLCALC 124 (H) 08/17/2020   LDLCALC 108 (H) 12/03/2018   Lab Results  Component Value Date   TSH 2.246 04/12/2020    Therapeutic Level Labs: No results found for: "LITHIUM" No results found for: "CBMZ" No results found for: "VALPROATE"  Current Medications: Current Outpatient Medications  Medication Sig Dispense Refill   allopurinol (ZYLOPRIM) 300 MG tablet TAKE 1 TABLET BY MOUTH EVERY DAY 15 tablet 0   carvedilol (COREG) 6.25 MG tablet TAKE 1 TABLET BY MOUTH 2 TIMES DAILY WITH A MEAL. 180 tablet 2   ELIQUIS 5 MG TABS tablet TAKE 1 TABLET BY MOUTH TWICE A DAY 180 tablet 1   empagliflozin (JARDIANCE) 10 MG TABS tablet Take 1 tablet (10 mg total) by mouth daily. 90 tablet 3   ENTRESTO 24-26 MG TAKE 1 TABLET BY MOUTH TWICE A DAY 60 tablet 6   ivabradine (CORLANOR) 5 MG TABS tablet Take 1 tablet (5 mg total) by mouth 2 (two) times daily with a meal. 180 tablet 1   pantoprazole (PROTONIX) 40 MG tablet Take 1 tablet (40 mg total) by mouth daily. 30 tablet 0   predniSONE (DELTASONE) 20 MG tablet Take 3 tabs PO daily x 5 days. (Patient not taking: Reported on 07/15/2021) 15 tablet 0   sertraline (ZOLOFT) 50 MG tablet Take 1.5 tablets (75 mg total) by mouth daily. 135 tablet 0   sildenafil (VIAGRA) 50 MG tablet Take  1 tablet (50 mg total) by mouth daily as needed for erectile dysfunction. (Patient not taking: Reported on 07/15/2021) 10 tablet 6   spironolactone (ALDACTONE) 25 MG tablet TAKE 1/2 TABLET BY MOUTH EVERY DAY 45 tablet 4   valACYclovir (VALTREX) 1000 MG tablet Take 1 tablet (1,000 mg total) by mouth 3 (three) times  daily. (Patient not taking: Reported on 07/15/2021) 21 tablet 0   No current facility-administered medications for this visit.     Psychiatric Specialty Exam: Review of Systems  Cardiovascular:  Negative for chest pain.  Neurological:  Negative for tremors.  Psychiatric/Behavioral:  Negative for agitation. The patient is nervous/anxious.     Blood pressure 112/80, pulse 74, temperature 98.8 F (37.1 C), height '6\' 1"'$  (1.854 m), weight (!) 318 lb (144.2 kg).Body mass index is 41.96 kg/m.  General Appearance: Casual  Eye Contact:  Fair  Speech:  Normal Rate  Volume:  Normal  Mood:  getting anxious  Affect:  Congruent  Thought Process:  Goal Directed  Orientation:  Full (Time, Place, and Person)  Thought Content:  Rumination  Suicidal Thoughts:  No  Homicidal Thoughts:  No  Memory:  Immediate;   Fair Recent;   Fair  Judgement:  Fair  Insight:  Fair  Psychomotor Activity:  Normal  Concentration:  Concentration: Fair and Attention Span: Fair  Recall:  Good  Fund of Knowledge:Good  Language: Fair  Akathisia:  No  Handed:  AIMS (if indicated):  not done  Assets:  Communication Skills Desire for Improvement  ADL's:  Intact  Cognition: WNL  Sleep:   variable   Screenings: IT sales professional Office Visit from 01/25/2021 in [facility] at [place] from 09/22/2020 in [place] Office Visit from 08/17/2020 in [facility] [place] from 08/03/2020 in [place] Video Visit from  05/17/2020 in [place]  PHQ-2 Total Score 0 0 0 1 1  PHQ-9 Total Score 0 -- 0 -- [place] Office Visit from 11/22/2021 in [place] Office Visit from 02/22/2021 in [place] [facility] from 02/16/2021 in [facility] [place] No Risk No Risk No Risk       Assessment and Plan:   Prior documentation reviewed  Adjustment disorder with depressed mood and anxiety:  Getting anxious without meds, restart zoloft '25mg'$  increse to 50 mg in 3 days and then '75mg'$  in 7 - 10 days    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

## 2021-12-08 ENCOUNTER — Ambulatory Visit: Payer: 59 | Attending: Cardiology | Admitting: Cardiology

## 2021-12-08 ENCOUNTER — Encounter: Payer: Self-pay | Admitting: Cardiology

## 2021-12-08 VITALS — BP 144/88 | HR 86 | Ht 73.0 in | Wt 310.0 lb

## 2021-12-08 DIAGNOSIS — G4733 Obstructive sleep apnea (adult) (pediatric): Secondary | ICD-10-CM

## 2021-12-08 DIAGNOSIS — I10 Essential (primary) hypertension: Secondary | ICD-10-CM | POA: Diagnosis not present

## 2021-12-08 MED ORDER — SALINE SPRAY 0.65 % NA SOLN: 2.0000 | Freq: Two times a day (BID) | NASAL | 4 refills | Status: DC

## 2021-12-08 NOTE — Progress Notes (Signed)
Virtual Sleep Medicine Note via Video Note   This visit type was conducted due to national recommendations for restrictions regarding the COVID-19 Pandemic (e.g. social distancing) in an effort to limit this patient's exposure and mitigate transmission in our community.  Due to his co-morbid illnesses, this patient is at least at moderate risk for complications without adequate follow up.  This format is felt to be most appropriate for this patient at this time.  All issues noted in this document were discussed and addressed.  A limited physical exam was performed with this format.  Please refer to the patient's chart for his consent to telehealth for [facility].   Evaluation Performed:  Sleep Medicine Note  Date:  12/08/2021   ID:  Kevin Baxter, DOB 1970-10-02, MRN [id number]  Patient Location:  Home  Provider location:   [place]  PCP:  Kevin Branch, MD  Sleep Medicine:  Fransico Him, MD Electrophysiologist:  None   Chief Complaint:  OSA  History of Present Illness:    Kevin Baxter is a 51 y.o. male who presents via audio/video conferencing for a telehealth visit today in referral by Dr. Larose Kells for evaluation of possible sleep apnea.   This is a 51yo male with a hx of HTN and GERD who is referred for evaluation of possible OSA.  He has morbid obesity, snoring, excessive daytime sleepiness with [place] sleepiness sore of 16.  At the time of his evaluation in 2021 I ordered a split-night sleep study but this was not done because he started having problems with CHF and was referred to Dr. Haroldine Laws.  He was referred back by Dr. Larose Kells and to be evaluated for possible sleep apnea due to excessive daytime sleepiness and snoring.   He underwent home sleep study on 08/29/2021 showing moderate obstructive sleep apnea with an AHI of 20.2/h and no nocturnal hypoxemia or central sleep apnea.He was started on auto CPAP from 4 to 15 cm H2O and is now here for follow-up.  He is doing  well with his PAP device and thinks that he has gotten used to it.  He tolerates the nasal pillow mask.  Since then can do you feel rested when he get up in the morning are you having are you getting sleepy during the day and you feel like you have to nap any during the day going on PAP he feels rested in the am and has no significant daytime sleepiness. He does note that the air seems to be more warm than he would like it.  He denies any significant mouth or nasal dryness or nasal congestion.  He does not think that he snores.    The patient does not have symptoms concerning for COVID-19 infection (fever, chills, cough, or new shortness of breath).    Prior CV studies:   The following studies were reviewed today: Home sleep study and PAP compliance download  Past Medical History:  Diagnosis Date   Allergy    Anxiety    Arthritis    CHF (congestive heart failure) (HCC)    Fundic gland polyposis of stomach    GERD with esophagitis    Gout    Hx of adenomatous colonic polyps    Hypertension    OSA on CPAP    Past Surgical History:  Procedure Laterality Date   COLONOSCOPY  05/2019   ESOPHAGOGASTRODUODENOSCOPY  05/2019   HAND SURGERY Right    2006 for a FX   IR ANGIOGRAM  PULMONARY BILATERAL SELECTIVE  04/16/2020   IR ANGIOGRAM SELECTIVE EACH ADDITIONAL VESSEL  04/16/2020   IR ANGIOGRAM SELECTIVE EACH ADDITIONAL VESSEL  04/16/2020   IR THROMBECT PRIM MECH INIT (INCLU) MOD SED  04/16/2020   IR US GUIDE VASC ACCESS LEFT  04/16/2020   IR US GUIDE VASC ACCESS LEFT  04/16/2020   IR US GUIDE VASC ACCESS RIGHT  04/16/2020   RADIOLOGY WITH ANESTHESIA N/A 04/16/2020   Procedure: IR WITH ANESTHESIA- PULMONARY EMBOLUS INTERVENTION;  Surgeon: Suzette Battiest, MD;  Location: [place];  Service: Radiology;  Laterality: N/A;   RIGHT/LEFT HEART CATH AND CORONARY ANGIOGRAPHY N/A 04/15/2020   Procedure: RIGHT/LEFT HEART CATH AND CORONARY ANGIOGRAPHY;  Surgeon: Jolaine Artist, MD;  Location: [place] [facility]  [facility];  Service: Cardiovascular;  Laterality: N/A;     Current Meds  Medication Sig   allopurinol (ZYLOPRIM) 300 MG tablet TAKE 1 TABLET BY MOUTH EVERY DAY   carvedilol (COREG) 6.25 MG tablet TAKE 1 TABLET BY MOUTH 2 TIMES DAILY WITH A MEAL.   ELIQUIS 5 MG TABS tablet TAKE 1 TABLET BY MOUTH TWICE A DAY   empagliflozin (JARDIANCE) 10 MG TABS tablet Take 1 tablet (10 mg total) by mouth daily.   ENTRESTO 24-26 MG TAKE 1 TABLET BY MOUTH TWICE A DAY   ivabradine (CORLANOR) 5 MG TABS tablet Take 1 tablet (5 mg total) by mouth 2 (two) times daily with a meal.   pantoprazole (PROTONIX) 40 MG tablet Take 1 tablet (40 mg total) by mouth daily.   predniSONE (DELTASONE) 20 MG tablet Take 3 tabs PO daily x 5 days.   sertraline (ZOLOFT) 50 MG tablet Take 1.5 tablets (75 mg total) by mouth daily.   sildenafil (VIAGRA) 50 MG tablet Take 1 tablet (50 mg total) by mouth daily as needed for erectile dysfunction.   spironolactone (ALDACTONE) 25 MG tablet TAKE 1/2 TABLET BY MOUTH EVERY DAY   valACYclovir (VALTREX) 1000 MG tablet Take 1 tablet (1,000 mg total) by mouth 3 (three) times daily.     Allergies:   Pseudoephedrine   Social History   Tobacco Use   Smoking status: Never   Smokeless tobacco: Never  Vaping Use   Vaping Use: Never used  Substance Use Topics   Alcohol use: Not Currently    Comment: very rare    Drug use: No     Family Hx: The patient's family history includes Cancer in his mother; Diabetes in his cousin, paternal aunt, sister, and sister; Heart failure in his father; Hypertension in his father. There is no history of Kevin cancer, Prostate cancer, Stroke, Esophageal cancer, Rectal cancer, or Stomach cancer.  ROS:   Please see the history of present illness.     All other systems reviewed and are negative.   Labs/Other Tests and Data Reviewed:    Recent Labs: 12/10/2020: ALT 19; B Natriuretic Peptide 25.3; BUN 15; Creatinine, Ser 1.27; Hemoglobin 18.2; Platelets 228;  Potassium 4.8; Sodium 138   Recent Lipid Panel Lab Results  Component Value Date/Time   CHOL 212 (H) 08/17/2020 10:55 AM   TRIG 123.0 08/17/2020 10:55 AM   TRIG 69 11/22/2005 10:16 AM   HDL 63.30 08/17/2020 10:55 AM   CHOLHDL 3 08/17/2020 10:55 AM   LDLCALC 124 (H) 08/17/2020 10:55 AM    Wt Readings from Last 3 Encounters:  12/08/21 (!) 310 lb (140.6 kg)  07/15/21 300 lb (136.1 kg)  02/16/21 284 lb (128.8 kg)     Objective:  Vital Signs:  BP (!) 144/88   Pulse 86   Ht '6\' 1"'$  (1.854 m)   Wt (!) 310 lb (140.6 kg)   BMI 40.90 kg/m    Well nourished, well developed male in no acute distress. Well appearing, alert and conversant, regular work of breathing,  good skin color  Eyes- anicteric mouth- oral mucosa is pink  neuro- grossly intact skin- no apparent rash or lesions or cyanosis  ASSESSMENT & PLAN:    1.  OSA - The patient is tolerating PAP therapy well without any problems. The PAP download performed by his DME was personally reviewed and interpreted by me today and showed an AHI of 0.9 /hr on auto CPAP from 4-15 cm H2O with 27% compliance in using more than 4 hours nightly.  The patient has been using and benefiting from PAP use and will continue to benefit from therapy.  -he did not use his device for a few days due to sinus infection -encouraged him to use nasal saline spray 2 sprays each nostril BID -I encouraged him to be more compliant with his device.  He understands that if he does not use his device at least 4 hours nightly 70% of the time insurance would not pay for his device and he will have to turn it in and they will have to start all over with the process again -I will repeat a download in 4 weeks and see him back in 8 weeks.  2. HTN -BP is  borderline elevated today but he had to run up the stairs and normally it is controlled at home -Continue prescription drug management with carvedilol 6.25 mg twice daily as well as Entresto 24-26 mg twice daily and  spironolactone 25 mg 1/2 tablet daily with as needed refills   Time:   Today, I have spent 15 minutes on telemdicine discussing medical problems including excessive daytime sleepiness, Obesity and HTN and reviewing patient's chart including OV notes from PCP.  Medication Adjustments/Labs and Tests Ordered: Current medicines are reviewed at length with the patient today.  Concerns regarding medicines are outlined above.  Tests Ordered: No orders of the defined types were placed in this encounter.  Medication Changes: No orders of the defined types were placed in this encounter.   Disposition:  Follow up with me in 3 months  Signed, Fransico Him, MD  12/08/2021 2:02 PM    [place]

## 2021-12-08 NOTE — Patient Instructions (Signed)
Medication Instructions:  Your physician has recommended you make the following change in your medication: 1) START using nasal saline spray - two sprays in each nostril twice daily *If you need a refill on your cardiac medications before your next appointment, please call your pharmacy*  Follow-Up: At [facility], you and your health needs are our priority.  As part of our continuing mission to provide you with exceptional heart care, we have created designated Provider Care Teams.  These Care Teams include your primary Cardiologist (physician) and Advanced Practice Providers (APPs -  Physician Assistants and Nurse Practitioners) who all work together to provide you with the care you need, when you need it.  Your next appointment:   3 month(s)  The format for your next appointment:   In Person  Provider:   Fransico Him, MD   Important Information About Sugar

## 2021-12-08 NOTE — Addendum Note (Signed)
Addended by: Bernestine Amass on: 12/08/2021 02:10 PM   Modules accepted: Orders

## 2022-01-03 ENCOUNTER — Other Ambulatory Visit (HOSPITAL_COMMUNITY): Payer: Self-pay | Admitting: Psychiatry

## 2022-01-03 ENCOUNTER — Encounter (HOSPITAL_COMMUNITY): Payer: Self-pay | Admitting: *Deleted

## 2022-01-25 ENCOUNTER — Other Ambulatory Visit (HOSPITAL_COMMUNITY): Payer: Self-pay | Admitting: Internal Medicine

## 2022-01-25 DIAGNOSIS — K219 Gastro-esophageal reflux disease without esophagitis: Secondary | ICD-10-CM

## 2022-02-10 ENCOUNTER — Ambulatory Visit (HOSPITAL_COMMUNITY)
Admission: RE | Admit: 2022-02-10 | Discharge: 2022-02-10 | Disposition: A | Discharge status: Home / Self Care | Payer: 59 | Source: Ambulatory Visit | Attending: Family Medicine | Admitting: Family Medicine

## 2022-02-10 VITALS — BP 150/100 | HR 80 | Wt 317.0 lb

## 2022-02-10 DIAGNOSIS — I10 Essential (primary) hypertension: Secondary | ICD-10-CM

## 2022-02-10 DIAGNOSIS — I428 Other cardiomyopathies: Secondary | ICD-10-CM | POA: Insufficient documentation

## 2022-02-10 DIAGNOSIS — R570 Cardiogenic shock: Secondary | ICD-10-CM | POA: Diagnosis not present

## 2022-02-10 DIAGNOSIS — Z79899 Other long term (current) drug therapy: Secondary | ICD-10-CM | POA: Insufficient documentation

## 2022-02-10 DIAGNOSIS — Z86718 Personal history of other venous thrombosis and embolism: Secondary | ICD-10-CM | POA: Diagnosis not present

## 2022-02-10 DIAGNOSIS — K0889 Other specified disorders of teeth and supporting structures: Secondary | ICD-10-CM | POA: Diagnosis not present

## 2022-02-10 DIAGNOSIS — Z7984 Long term (current) use of oral hypoglycemic drugs: Secondary | ICD-10-CM | POA: Insufficient documentation

## 2022-02-10 DIAGNOSIS — Z7901 Long term (current) use of anticoagulants: Secondary | ICD-10-CM | POA: Insufficient documentation

## 2022-02-10 DIAGNOSIS — I5082 Biventricular heart failure: Secondary | ICD-10-CM | POA: Diagnosis not present

## 2022-02-10 DIAGNOSIS — Z86711 Personal history of pulmonary embolism: Secondary | ICD-10-CM | POA: Diagnosis not present

## 2022-02-10 DIAGNOSIS — I110 Hypertensive heart disease with heart failure: Secondary | ICD-10-CM | POA: Diagnosis present

## 2022-02-10 DIAGNOSIS — I5022 Chronic systolic (congestive) heart failure: Secondary | ICD-10-CM | POA: Diagnosis not present

## 2022-02-10 DIAGNOSIS — I2609 Other pulmonary embolism with acute cor pulmonale: Secondary | ICD-10-CM

## 2022-02-10 DIAGNOSIS — I513 Intracardiac thrombosis, not elsewhere classified: Secondary | ICD-10-CM

## 2022-02-10 LAB — CBC
HCT: 60.7 % — ABNORMAL HIGH (ref 39.0–52.0)
Hemoglobin: 19.4 g/dL — ABNORMAL HIGH (ref 13.0–17.0)
MCH: 25.7 pg — ABNORMAL LOW (ref 26.0–34.0)
MCHC: 32 g/dL (ref 30.0–36.0)
MCV: 80.4 fL (ref 80.0–100.0)
Platelets: 232 10*3/uL (ref 150–400)
RBC: 7.55 MIL/uL — ABNORMAL HIGH (ref 4.22–5.81)
RDW: 17.7 % — ABNORMAL HIGH (ref 11.5–15.5)
WBC: 5.7 10*3/uL (ref 4.0–10.5)
nRBC: 0 % (ref 0.0–0.2)

## 2022-02-10 LAB — BASIC METABOLIC PANEL
Anion gap: 10 (ref 5–15)
BUN: 18 mg/dL (ref 6–20)
CO2: 24 mmol/L (ref 22–32)
Calcium: 9.3 mg/dL (ref 8.9–10.3)
Chloride: 100 mmol/L (ref 98–111)
Creatinine, Ser: 1.27 mg/dL — ABNORMAL HIGH (ref 0.61–1.24)
GFR, Estimated: >60 mL/min (ref >60)
Glucose, Bld: 101 mg/dL — ABNORMAL HIGH (ref 70–99)
Potassium: 4.3 mmol/L (ref 3.5–5.1)
Sodium: 134 mmol/L — ABNORMAL LOW (ref 135–145)

## 2022-02-10 NOTE — Progress Notes (Signed)
ADVANCED HF CLINIC VISIT  PCP: Colon Branch, MD Primary Cardiologists: Fransico Him, Bensimhon, Daniel  HPI: Kevin Baxter is a 52 y.o. male with HTN, morbid obesity, pancreatitis, DVT with submassive PE and systolic HF due to NICM.  Admitted in 3/22 for intractable n/v. Initially felt to have acute cholecystitis. Echo showed EF 15% with mod RV dysfunction. + large LV clot. Started on inotropes. Cath showed normal coronaries. cMRI LVEF 12% Apical akinesis Elevated ECV in apex. RVEF 21%.   Post -cath developed worsening CP and tachycardia. CT showed bilateral submassive PE with pulmonary infarct. Underwent catheter-based thrombectomy of PE. Milrinone slowly weaned off. Discharged 04/24/20.   Today he returns for HF follow up.Overall feeling fine but anxious about his appointment. Complaining of tooth pain and will likely need oral surgery. Says he has been stressed out at work. Denies SOB/PND/Orthopnea. SBP at home typically 110-120.  No bleeding issues.  Appetite ok. No fever or chills. He has not been weighing at home. Taking all medications. Works for full time at [employer]. Works remotely  Echo 06/09/20 EF 25-30% RV mild HK ? Small laminated apical clot  Echo  12/10/20 EF 30-35% Apical AK with small apical thrombus RV ok  CMRI EF 33% RV 40% Apioal thrombus resolved.   ROS: All systems negative except as listed in HPI, PMH and Problem List.  SH:  Social History   Socioeconomic History   Marital status: Married    Spouse name: Not on file   Number of children: 2   Years of education: Not on file   Highest education level: Some college, no degree  Occupational History   Occupation: works for [employer]  Tobacco Use   Smoking status: Never   Smokeless tobacco: Never  Vaping Use   Vaping Use: Never used  Substance and Sexual Activity   Alcohol use: Not Currently    Comment: very rare    Drug use: No   Sexual activity: Not on file  Other Topics  Concern   Not on file  Social History Narrative   Household: pt, wife daughter (2013),  boy  (2006),    Social Determinants of Radio broadcast assistant Strain: Not on file  Food Insecurity: Not on file  Transportation Needs: Not on file  Physical Activity: Not on file  Stress: Not on file  Social Connections: Not on file  Intimate Partner Violence: Not on file    FH:  Family History  Problem Relation Age of Onset   Cancer Mother        CERVICAL   Hypertension Father    Heart failure Father        no MI, d/t etoh   Diabetes Sister    Diabetes Sister    Diabetes Paternal Aunt    Diabetes Cousin    Colon cancer Neg Hx    Prostate cancer Neg Hx    Stroke Neg Hx    Esophageal cancer Neg Hx    Rectal cancer Neg Hx    Stomach cancer Neg Hx     Past Medical History:  Diagnosis Date   Allergy    Anxiety    Arthritis    CHF (congestive heart failure) (HCC)    Fundic gland polyposis of stomach    GERD with esophagitis  Gout    Hx of adenomatous colonic polyps    Hypertension    OSA on CPAP     Current Outpatient Medications  Medication Sig Dispense Refill   allopurinol (ZYLOPRIM) 300 MG tablet TAKE 1 TABLET BY MOUTH EVERY DAY 15 tablet 0   carvedilol (COREG) 6.25 MG tablet TAKE 1 TABLET BY MOUTH 2 TIMES DAILY WITH A MEAL. 180 tablet 2   ELIQUIS 5 MG TABS tablet TAKE 1 TABLET BY MOUTH TWICE A DAY 180 tablet 1   empagliflozin (JARDIANCE) 10 MG TABS tablet Take 1 tablet (10 mg total) by mouth daily. 90 tablet 3   ENTRESTO 24-26 MG TAKE 1 TABLET BY MOUTH TWICE A DAY 60 tablet 6   ivabradine (CORLANOR) 5 MG TABS tablet Take 1 tablet (5 mg total) by mouth 2 (two) times daily with a meal. 180 tablet 1   sertraline (ZOLOFT) 50 MG tablet TAKE 1 AND 1/2 TABLETS BY MOUTH DAILY 135 tablet 0   sildenafil (VIAGRA) 50 MG tablet Take 1 tablet (50 mg total) by mouth daily as needed for erectile dysfunction. 10 tablet 6   sodium chloride (OCEAN) 0.65 % SOLN nasal spray Place 2  sprays into both nostrils 2 (two) times daily. 15 mL 4   spironolactone (ALDACTONE) 25 MG tablet TAKE 1/2 TABLET BY MOUTH EVERY DAY 45 tablet 4   No current facility-administered medications for this encounter.    Vitals:   02/10/22 1137  BP: (!) 150/100  Pulse: 80  SpO2: 96%  Weight: (!) 143.8 kg (317 lb)    Wt Readings from Last 3 Encounters:  02/10/22 (!) 143.8 kg (317 lb)  12/08/21 (!) 140.6 kg (310 lb)  07/15/21 136.1 kg (300 lb)    PHYSICAL EXAM: General:  Well appearing. No resp difficulty. Walked in the clinic.  HEENT: normal Neck: supple. no JVD. Carotids 2+ bilat; no bruits. No lymphadenopathy or thryomegaly appreciated. Cor: PMI nondisplaced. Regular rate & rhythm. No rubs, gallops or murmurs. Lungs: clear Abdomen: soft, nontender, nondistended. No hepatosplenomegaly. No bruits or masses. Good bowel sounds. Extremities: no cyanosis, clubbing, rash, edema Neuro: alert & orientedx3, cranial nerves grossly intact. moves all 4 extremities w/o difficulty. Affect pleasant  EKG: SR 76 bpm personally reviewed.   ASSESSMENT & PLAN:  1. Chronic biventricular CHF with cardiogenic shock: - ECHO 3/22  LVEF < 20% with large LV thrombus,mild to moderate RV dysfunction - Cardiac catheterization 04/16/20 with normal coronaries - cMRI 3/22 LVEF 12% RVEF 21%. Apical akinesis Elevated ECV in apex. No LGE in apex. RVEF 21%. - also complicated by RV failure from PE - Echo  06/09/20 EF 25-30% RV mild HK ? Small laminated apical clot  - Echo 12/10/20 EF 30-35% Apical AK with small apical thrombus RV ok - CMRI 12/2020 EF 33% RV 40%.  - NYHA II.  - Volume status ok using torsemide prn.  - Continue spironolactone 12.5 - Continue entresto 24/26 BID - Continue ivabradine to 5 bid - Continue jardiance 10  - Continue carvedilol to 6.25 bid - Cath with no significant CAD in apex. Unclear if previous infarct of other process.  - Check BMET   2. LV thrombus: - MRI clot resolved.  -  Continue Eliquis. No bleeding    3. 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 Also R femoral DVT - Chest CT 3/22 bilateral submassive PE with RV strain and RUL infarct - s/p Penumbra clot extraction 04/16/20 - Echo 11/2020 RV normal  - Will  need lifelong Eliquis. Continue eliquis 2.5 bid based on Amplify-EXT once off AC for LV thrombus  4. HTN  BP high today but say he gets white coat. BP at home stable.  Continue current regimen.   Check BMET/CBC--> I reviewed stable.   Follow up with Dr Haroldine Laws in 3-4 months.    Darrick Grinder, NP  11:39 AM

## 2022-02-10 NOTE — Patient Instructions (Signed)
It was great to see you today! No medication changes are needed at this time.  Labs today We will only contact you if something comes back abnormal or we need to make some changes. Otherwise no news is good news!  Your physician wants you to follow-up in: 3 months with Dr Haroldine Laws and echo. You will receive a reminder letter in the mail two months in advance. If you don't receive a letter, please call our office to schedule the follow-up appointment.  Your physician has requested that you have an echocardiogram. Echocardiography is a painless test that uses sound waves to create images of your heart. It provides your doctor with information about the size and shape of your heart and how well your heart's chambers and valves are working. This procedure takes approximately one hour. There are no restrictions for this procedure. Please do NOT wear cologne, perfume, aftershave, or lotions (deodorant is allowed). Please arrive 15 minutes prior to your appointment time.   Do the following things EVERYDAY: Weigh yourself in the morning before breakfast. Write it down and keep it in a log. Take your medicines as prescribed Eat low salt foods--Limit salt (sodium) to 2000 mg per day.  Stay as active as you can everyday Limit all fluids for the day to less than 2 liters  At the [facility], you and your health needs are our priority. As part of our continuing mission to provide you with exceptional heart care, we have created designated Provider Care Teams. These Care Teams include your primary Cardiologist (physician) and Advanced Practice Providers (APPs- Physician Assistants and Nurse Practitioners) who all work together to provide you with the care you need, when you need it.   You may see any of the following providers on your designated Care Team at your next follow up: Dr Glori Bickers Dr Loralie Champagne Dr. Roxana Hires, NP Lyda Jester, [place] [facility] [place], [place] Forestine Na, NP Audry Riles, PharmD   Please be sure to bring in all your medications bottles to every appointment.   If you have any questions or concerns before your next appointment please send [place] a message through [place] or call our office at [phone].    TO LEAVE A MESSAGE FOR THE NURSE SELECT OPTION 2, PLEASE LEAVE A MESSAGE INCLUDING: YOUR NAME DATE OF BIRTH CALL BACK NUMBER REASON FOR CALL**this is important as we prioritize the call backs  YOU WILL RECEIVE A CALL BACK THE SAME DAY AS LONG AS YOU CALL BEFORE 4:00 PM

## 2022-02-16 ENCOUNTER — Other Ambulatory Visit: Payer: Self-pay | Admitting: Family

## 2022-03-02 ENCOUNTER — Telehealth (HOSPITAL_COMMUNITY): Payer: Self-pay

## 2022-03-02 NOTE — Telephone Encounter (Signed)
Received a medical clearance form from [facility]ill[facility] , requesting Cardiology clearance for the following procedure: 1 tooth extraction. Medical clearance form was signed by Dr. Glori Bickers and successfully faxed to [phone] on Thursday, February 1,. Form will be scanned into patients chart.

## 2022-03-06 ENCOUNTER — Other Ambulatory Visit (HOSPITAL_COMMUNITY): Payer: Self-pay | Admitting: Internal Medicine

## 2022-03-13 ENCOUNTER — Ambulatory Visit: Payer: 59 | Attending: Cardiology | Admitting: Cardiology

## 2022-03-13 ENCOUNTER — Encounter: Payer: Self-pay | Admitting: Cardiology

## 2022-03-13 VITALS — BP 127/80 | Ht 73.0 in | Wt 309.0 lb

## 2022-03-13 DIAGNOSIS — J329 Chronic sinusitis, unspecified: Secondary | ICD-10-CM

## 2022-03-13 DIAGNOSIS — G4733 Obstructive sleep apnea (adult) (pediatric): Secondary | ICD-10-CM

## 2022-03-13 DIAGNOSIS — I10 Essential (primary) hypertension: Secondary | ICD-10-CM

## 2022-03-13 NOTE — Addendum Note (Signed)
Addended by: Vergia Alcon A on: 03/13/2022 11:23 AM   Modules accepted: Orders

## 2022-03-13 NOTE — Progress Notes (Signed)
Virtual Sleep Medicine Note via Video Note   This visit type was conducted due to national recommendations for restrictions regarding the COVID-19 Pandemic (e.g. social distancing) in an effort to limit this patient's exposure and mitigate transmission in our community.  Due to his co-morbid illnesses, this patient is at least at moderate risk for complications without adequate follow up.  This format is felt to be most appropriate for this patient at this time.  All issues noted in this document were discussed and addressed.  A limited physical exam was performed with this format.  Please refer to the patient's chart for his consent to telehealth for [facility].   Evaluation Performed:  Sleep Medicine Note  Date:  03/13/2022   ID:  Kevin Baxter, DOB [birth year], MRN [id number]  Patient Location:  Home  Provider location:   [place]  PCP:  Colon Branch, MD  Sleep Medicine:  Fransico Him, MD Electrophysiologist:  None   Chief Complaint:  OSA  History of Present Illness:    Kevin Baxter is a 52 y.o. male who presents via audio/video conferencing for a telehealth visit today in referral by Dr. Larose Kells for evaluation of possible sleep apnea.   This is a 52yo male with a hx of HTN and GERD who is referred for evaluation of possible OSA.  He has morbid obesity, snoring, excessive daytime sleepiness with Epworth sleepiness sore of 16.  At the time of his evaluation in 2021 I ordered a split-night sleep study but this was not done because he started having problems with CHF and was referred to Dr. Haroldine Laws.  He was referred back by Dr. Larose Kells and to be evaluated for possible sleep apnea due to excessive daytime sleepiness and snoring.   He underwent home sleep study on 08/29/2021 showing moderate obstructive sleep apnea with an AHI of 20.2/h and no nocturnal hypoxemia or central sleep apnea.He was started on auto CPAP from 4 to 15 cm H2O and is now here for follow-up.  At last  office visit he was having some problems with a sinus infection and I encouraged him to use nasal saline spray and try to be more compliant with his device.  He is now here for follow-up.  He is doing well with his PAP device and thinks that he has gotten used to it.  He tolerates the nasal pillow mask and feels the pressure is adequate.  He tries to use his device 4-5 hours nightly.  He had another problem with his teeth and also has some sinus issues again so compliance has fallen off some. Since going on PAP he feels rested in the am and has no significant daytime sleepiness.  He does not think that he snores.    Prior CV studies:   The following studies were reviewed today: Home sleep study and PAP compliance download  Past Medical History:  Diagnosis Date   Allergy    Anxiety    Arthritis    CHF (congestive heart failure) (HCC)    Fundic gland polyposis of stomach    GERD with esophagitis    Gout    Hx of adenomatous colonic polyps    Hypertension    OSA on CPAP    Past Surgical History:  Procedure Laterality Date   COLONOSCOPY  05/2019   ESOPHAGOGASTRODUODENOSCOPY  05/2019   HAND SURGERY Right    2006 for a FX   IR ANGIOGRAM PULMONARY BILATERAL SELECTIVE  04/16/2020   IR ANGIOGRAM  SELECTIVE EACH ADDITIONAL VESSEL  04/16/2020   IR ANGIOGRAM SELECTIVE EACH ADDITIONAL VESSEL  04/16/2020   IR THROMBECT PRIM MECH INIT (INCLU) MOD SED  04/16/2020   IR US GUIDE VASC ACCESS LEFT  04/16/2020   IR US GUIDE VASC ACCESS LEFT  04/16/2020   IR US GUIDE VASC ACCESS RIGHT  04/16/2020   RADIOLOGY WITH ANESTHESIA N/A 04/16/2020   Procedure: IR WITH ANESTHESIA- PULMONARY EMBOLUS INTERVENTION;  Surgeon: Suzette Battiest, MD;  Location: [place];  Service: Radiology;  Laterality: N/A;   RIGHT/LEFT HEART CATH AND CORONARY ANGIOGRAPHY N/A 04/15/2020   Procedure: RIGHT/LEFT HEART CATH AND CORONARY ANGIOGRAPHY;  Surgeon: Jolaine Artist, MD;  Location: [place] CV [facility];  Service: Cardiovascular;   Laterality: N/A;     No outpatient medications have been marked as taking for the 03/13/22 encounter (Video Visit) with Sueanne Margarita, MD.     Allergies:   Pseudoephedrine   Social History   Tobacco Use   Smoking status: Never   Smokeless tobacco: Never  Vaping Use   Vaping Use: Never used  Substance Use Topics   Alcohol use: Not Currently    Comment: very rare    Drug use: No     Family Hx: The patient's family history includes Cancer in his mother; Diabetes in his cousin, paternal aunt, sister, and sister; Heart failure in his father; Hypertension in his father. There is no history of Colon cancer, Prostate cancer, Stroke, Esophageal cancer, Rectal cancer, or Stomach cancer.  ROS:   Please see the history of present illness.     All other systems reviewed and are negative.   Labs/Other Tests and Data Reviewed:    Recent Labs: 02/10/2022: BUN 18; Creatinine, Ser 1.27; Hemoglobin 19.4; Platelets 232; Potassium 4.3; Sodium 134   Recent Lipid Panel Lab Results  Component Value Date/Time   CHOL 212 (H) 08/17/2020 10:55 AM   TRIG 123.0 08/17/2020 10:55 AM   TRIG 69 11/22/2005 10:16 AM   HDL 63.30 08/17/2020 10:55 AM   CHOLHDL 3 08/17/2020 10:55 AM   LDLCALC 124 (H) 08/17/2020 10:55 AM    Wt Readings from Last 3 Encounters:  03/13/22 (!) 309 lb (140.2 kg)  02/10/22 (!) 317 lb (143.8 kg)  12/08/21 (!) 310 lb (140.6 kg)     Objective:    Vital Signs:  BP 127/80   Ht 6' 1"$  (1.854 m)   Wt (!) 309 lb (140.2 kg)   BMI 40.77 kg/m    Well nourished, well developed male in no acute distress. Well appearing, alert and conversant, regular work of breathing,  good skin color  Eyes- anicteric mouth- oral mucosa is pink  neuro- grossly intact skin- no apparent rash or lesions or cyanosis  ASSESSMENT & PLAN:    OSA - The patient is tolerating PAP therapy well without any problems. The PAP download performed by his DME was personally reviewed and interpreted by me  today and showed an AHI of 0.9/hr on auto CPAP  with 37% compliance in using more than 4 hours nightly.  The patient has been using and benefiting from PAP use and will continue to benefit from therapy.  -I encouraged him to be more compliant with his device and try to use at least 5 hours nightly -he thinks that his saline spay he uses smells bad and so he has not used it so I recommended getting the AYR brand -he has a lot of sinus issues so I will refer him to Dr.  Redmond Baseman with [facility]  HTN -BP is controlled on exam today -Continue prescription drug management with carvedilol 6.25 mg twice daily, Entresto 24-26 mg twice daily, spironolactone 12.5 mg daily with as needed refills  Time:   Today, I have spent 15 minutes on telemdicine discussing medical problems including excessive daytime sleepiness, Obesity and HTN and reviewing patient's chart including OV notes from PCP.  Medication Adjustments/Labs and Tests Ordered: Current medicines are reviewed at length with the patient today.  Concerns regarding medicines are outlined above.  Tests Ordered: No orders of the defined types were placed in this encounter.  Medication Changes: No orders of the defined types were placed in this encounter.   Disposition:  Follow up with me in 3 months  Signed, Fransico Him, MD  03/13/2022 9:40 AM    [place]

## 2022-03-13 NOTE — Patient Instructions (Addendum)
Medication Instructions:  Your physician recommends that you continue on your current medications as directed. Please refer to the Current Medication list given to you today.  *If you need a refill on your cardiac medications before your next appointment, please call your pharmacy*  Follow-Up: At [facility], you and your health needs are our priority.  As part of our continuing mission to provide you with exceptional heart care, we have created designated Provider Care Teams.  These Care Teams include your primary Cardiologist (physician) and Advanced Practice Providers (APPs -  Physician Assistants and Nurse Practitioners) who all work together to provide you with the care you need, when you need it.  Your next appointment:   1 year(s)  Provider:   Dr. Radford Pax  Other Instructions You have been referred to Dr Redmond Baseman with [facility]. They will call you to set up appointment.

## 2022-03-23 ENCOUNTER — Telehealth: Payer: Self-pay | Admitting: Internal Medicine

## 2022-03-23 NOTE — Telephone Encounter (Signed)
Prescription Request  03/23/2022  Is this a "Controlled Substance" medicine? No  LOV: Visit date not found  ( CPE scheduled 05/22/22)   What is the name of the medication or equipment? allopurinol (ZYLOPRIM) 300 MG tablet   Have you contacted your pharmacy to request a refill? Yes - last rx sent in by [place] would you like this sent to?  [id number] [id number]: [phone]: [phone]   Patient notified that their request is being sent to the clinical staff for review and that they should receive a response within 2 business days.   Please advise at Mobile [phone](mobile)

## 2022-03-24 MED ORDER — ALLOPURINOL 300 MG PO TABS: 300.0000 mg | ORAL_TABLET | Freq: Every day | ORAL | 1 refills | Status: DC

## 2022-03-24 NOTE — Telephone Encounter (Signed)
Rx sent 

## 2022-03-24 NOTE — Addendum Note (Signed)
Addended byDamita Dunnings D on: 03/24/2022 07:45 AM   Modules accepted: Orders

## 2022-04-07 ENCOUNTER — Telehealth: Payer: Self-pay | Admitting: Internal Medicine

## 2022-04-07 ENCOUNTER — Ambulatory Visit: Payer: 59 | Admitting: Family

## 2022-04-07 VITALS — BP 146/95 | HR 79 | Temp 97.7°F | Resp 16 | Wt 318.0 lb

## 2022-04-07 DIAGNOSIS — I10 Essential (primary) hypertension: Secondary | ICD-10-CM

## 2022-04-07 DIAGNOSIS — R040 Epistaxis: Secondary | ICD-10-CM

## 2022-04-07 NOTE — Assessment & Plan Note (Signed)
New. His cpap is humidified.  I do not see any nasal lesions. Likely related to Eliquis and dry heated air in the home. Recommended nasal saline spray, topical aquaphor to nares with q-tip and referral to ENT. We discussed that if he develops a nose bleed that he cannot stop at home he should proceed to the ER for further treatment.

## 2022-04-07 NOTE — Assessment & Plan Note (Addendum)
BP Readings from Last 3 Encounters:  04/07/22 (!) 146/95  03/13/22 127/80  02/10/22 (!) 150/100   BP above goal here in the office today.  Home readings are reportedly much better.  He will recheck his bp this afternoon and send me his reading via mychart.

## 2022-04-07 NOTE — Progress Notes (Signed)
Subjective:   By signing my name below, I, Madelin Rear, attest that this documentation has been prepared under the direction and in the presence of Debbrah Alar, NP.  04/07/2022.   Patient ID: Kevin Baxter, male    DOB: 01/10/1971, 52 y.o.   MRN: [id number]  Chief Complaint  Patient presents with   Epistaxis    Complains of nose bleeds mostly in the mornings. Has appointment with ENT in May.     HPI Patient is in today for an office visit.  Epistaxis: He complains of intermittent nosebleeds over the last couple of weeks. Now they are becoming more frequent. Typically his nosebleeds are localized to his right nostril. They will last for at least 4 minutes before resolving spontaneously. He has been trying to cool his environment as he is not sure if the nosebleeds are related to the dryness of the air, his CPAP, and his anticoagulation. His CPAP is humidified, but it does cause him to feel dried out. He has not used his CPAP for a few nights now. Last night he did not experience a nosebleed.  Blood Pressure: His blood pressure was initially 146/95 in clinic. On manual recheck of his right arm his BP is 138/104. He notes that he is usually anxious when at the doctor's office. At home his systolic blood pressures are closer to 117-124 on average. BP Readings from Last 3 Encounters:  04/07/22 (!) 146/95  03/13/22 127/80  02/10/22 (!) 150/100   Weight loss: Additionally he asks about weight loss programs or appetite suppressants that would be recommended. Lately he does not complete much formal exercise. However, he did restart walking this week on Monday, Tuesday, and Wednesday. He states his diet is "terrible." He enjoys doughnuts and soda. He tries to choose sodas such as cherry coke zero for low sugar. Last night he had a garden salad with ranch dressing, fries, and soda. He denies smoking or alcohol consumption.  Denies having any fever, new muscle pain, joint pain, new  moles, congestion, sinus pain, sore throat, chest pain, palpitations, cough, SOB, wheezing, n/v/d, constipation, blood in stool, dysuria, frequency, hematuria, at this time.  Past Medical History:  Diagnosis Date   Allergy    Anxiety    Arthritis    CHF (congestive heart failure) (HCC)    Fundic gland polyposis of stomach    GERD with esophagitis    Gout    Hx of adenomatous colonic polyps    Hypertension    OSA on CPAP     Past Surgical History:  Procedure Laterality Date   COLONOSCOPY  05/2019   ESOPHAGOGASTRODUODENOSCOPY  05/2019   HAND SURGERY Right    2006 for a FX   IR ANGIOGRAM PULMONARY BILATERAL SELECTIVE  04/16/2020   IR ANGIOGRAM SELECTIVE EACH ADDITIONAL VESSEL  04/16/2020   IR ANGIOGRAM SELECTIVE EACH ADDITIONAL VESSEL  04/16/2020   IR THROMBECT PRIM MECH INIT (INCLU) MOD SED  04/16/2020   IR US GUIDE VASC ACCESS LEFT  04/16/2020   IR US GUIDE VASC ACCESS LEFT  04/16/2020   IR US GUIDE VASC ACCESS RIGHT  04/16/2020   RADIOLOGY WITH ANESTHESIA N/A 04/16/2020   Procedure: IR WITH ANESTHESIA- PULMONARY EMBOLUS INTERVENTION;  Surgeon: Suzette Battiest, MD;  Location: [facility];  Service: Radiology;  Laterality: N/A;   RIGHT/LEFT HEART CATH AND CORONARY ANGIOGRAPHY N/A 04/15/2020   Procedure: RIGHT/LEFT HEART CATH AND CORONARY ANGIOGRAPHY;  Surgeon: Jolaine Artist, MD;  Location: [facility];  Service: Cardiovascular;  Laterality: N/A;    Family History  Problem Relation Age of Onset   Cancer Mother        CERVICAL   Hypertension Father    Heart failure Father        no MI, d/t etoh   Diabetes Sister    Diabetes Sister    Diabetes Paternal Aunt    Diabetes Cousin    Colon cancer Neg Hx    Prostate cancer Neg Hx    Stroke Neg Hx    Esophageal cancer Neg Hx    Rectal cancer Neg Hx    Stomach cancer Neg Hx     Social History   Socioeconomic History   Marital status: Married    Spouse name: Not on file   Number of children: 2   Years of education: Not on  file   Highest education level: Some college, no degree  Occupational History   Occupation: works for [employer]  Tobacco Use   Smoking status: Never   Smokeless tobacco: Never  Vaping Use   Vaping Use: Never used  Substance and Sexual Activity   Alcohol use: Not Currently    Comment: very rare    Drug use: No   Sexual activity: Not on file  Other Topics Concern   Not on file  Social History Narrative   Household: pt, wife daughter (2013),  boy  (2006),    Social Determinants of Radio broadcast assistant Strain: Not on file  Food Insecurity: Not on file  Transportation Needs: Not on file  Physical Activity: Not on file  Stress: Not on file  Social Connections: Not on file  Intimate Partner Violence: Not on file    Outpatient Medications Prior to Visit  Medication Sig Dispense Refill   allopurinol (ZYLOPRIM) 300 MG tablet Take 1 tablet (300 mg total) by mouth daily. 90 tablet 1   carvedilol (COREG) 6.25 MG tablet TAKE 1 TABLET BY MOUTH TWICE A DAY WITH MEALS 180 tablet 2   ELIQUIS 5 MG TABS tablet TAKE 1 TABLET BY MOUTH TWICE A DAY 180 tablet 1   ENTRESTO 24-26 MG TAKE 1 TABLET BY MOUTH TWICE A DAY 60 tablet 6   ivabradine (CORLANOR) 5 MG TABS tablet Take 1 tablet (5 mg total) by mouth 2 (two) times daily with a meal. 180 tablet 1   JARDIANCE 10 MG TABS tablet TAKE 1 TABLET BY MOUTH EVERY DAY 90 tablet 3   sertraline (ZOLOFT) 50 MG tablet TAKE 1 AND 1/2 TABLETS BY MOUTH DAILY 135 tablet 0   sildenafil (VIAGRA) 50 MG tablet Take 1 tablet (50 mg total) by mouth daily as needed for erectile dysfunction. 10 tablet 6   sodium chloride (OCEAN) 0.65 % SOLN nasal spray Place 2 sprays into both nostrils 2 (two) times daily. 15 mL 4   spironolactone (ALDACTONE) 25 MG tablet TAKE 1/2 TABLET BY MOUTH EVERY DAY 45 tablet 4   No facility-administered medications prior to visit.    Allergies  Allergen Reactions   Pseudoephedrine     REACTION: jittery    Review of Systems   Constitutional:  Negative for fever.  HENT:  Positive for nosebleeds. Negative for congestion, sinus pain and sore throat.   Respiratory:  Negative for cough, shortness of breath and wheezing.   Cardiovascular:  Negative for chest pain and palpitations.  Gastrointestinal:  Negative for blood in stool, constipation, diarrhea, nausea and vomiting.  Genitourinary:  Negative for dysuria, frequency and hematuria.  Musculoskeletal:  Negative for joint pain and myalgias.       Objective:    Physical Exam Constitutional:      General: He is not in acute distress.    Appearance: Normal appearance. He is not ill-appearing.  HENT:     Head: Normocephalic and atraumatic.     Right Ear: Tympanic membrane, ear canal and external ear normal.     Left Ear: Tympanic membrane, ear canal and external ear normal.     Nose: Nose normal.  Eyes:     Extraocular Movements: Extraocular movements intact.     Pupils: Pupils are equal, round, and reactive to light.  Cardiovascular:     Rate and Rhythm: Normal rate and regular rhythm.     Heart sounds: Normal heart sounds. No murmur heard.    No gallop.  Pulmonary:     Effort: Pulmonary effort is normal. No respiratory distress.     Breath sounds: Normal breath sounds. No wheezing or rales.  Skin:    General: Skin is warm and dry.  Neurological:     General: No focal deficit present.     Mental Status: He is alert and oriented to person, place, and time.  Psychiatric:        Mood and Affect: Mood normal.        Behavior: Behavior normal.     BP (!) 146/95 (BP Location: Left Arm, Patient Position: Sitting, Cuff Size: Large)   Pulse 79   Temp 97.7 F (36.5 C) (Oral)   Resp 16   Wt (!) 318 lb (144.2 kg)   SpO2 99%   BMI 41.96 kg/m  Wt Readings from Last 3 Encounters:  04/07/22 (!) 318 lb (144.2 kg)  03/13/22 (!) 309 lb (140.2 kg)  02/10/22 (!) 317 lb (143.8 kg)        Assessment & Plan:   Problem List Items Addressed This Visit        Unprioritized   Morbid obesity ([place])    We discussed focusing diet on lean proteins, fresh produce and limiting carbs/sugars. Especially removing sugared soda in his diet. Recommend 30 minutes of walking 5 days a week.  It does not appear that his health plan will cover GLP-1 meds for weight loss. He is [facility].       Relevant Orders   Amb Ref to [facility]   Essential hypertension    BP Readings from Last 3 Encounters:  04/07/22 (!) 146/95  03/13/22 127/80  02/10/22 (!) 150/100  BP above goal here in the office today.  Home readings are reportedly much better.  He will recheck his bp this afternoon and send me his reading via mychart.       Epistaxis - Primary    New. His cpap is humidified.  I do not see any nasal lesions. Likely related to Eliquis and dry heated air in the home. Recommended nasal saline spray, topical aquaphor to nares with q-tip and referral to ENT. We discussed that if he develops a nose bleed that he cannot stop at home he should proceed to the ER for further treatment.       Relevant Orders   Ambulatory referral to ENT     No orders of the defined types were placed in this encounter.   I, Nance Pear, NP, personally preformed the services described in this documentation.  All medical record entries made by the scribe were at my  direction and in my presence.  I have reviewed the chart and discharge instructions (if applicable) and agree that the record reflects my personal performance and is accurate and complete. 04/07/2022.  I,Mathew Stumpf,acting as a Education administrator for [employer], NP.,have documented all relevant documentation on the behalf of Nance Pear, NP,as directed by  Nance Pear, NP while in the presence of Nance Pear, NP.   Nance Pear, NP

## 2022-04-07 NOTE — Assessment & Plan Note (Signed)
We discussed focusing diet on lean proteins, fresh produce and limiting carbs/sugars. Especially removing sugared soda in his diet. Recommend 30 minutes of walking 5 days a week.  It does not appear that his health plan will cover GLP-1 meds for weight loss. He is interested in a referral to [facility].

## 2022-04-07 NOTE — Telephone Encounter (Signed)
Pt called to advise he received a call from [facility] this morning but he understood that he would be getting a referral for an ENT and weight mgmt and thought maybe the referral was sent to wrong location by mistake.

## 2022-04-24 DIAGNOSIS — Z0289 Encounter for other administrative examinations: Secondary | ICD-10-CM

## 2022-04-25 ENCOUNTER — Other Ambulatory Visit: Payer: Self-pay | Admitting: Internal Medicine

## 2022-04-26 ENCOUNTER — Encounter (INDEPENDENT_AMBULATORY_CARE_PROVIDER_SITE_OTHER): Payer: Self-pay | Admitting: Family Medicine

## 2022-05-08 ENCOUNTER — Telehealth (HOSPITAL_COMMUNITY): Payer: Self-pay

## 2022-05-08 NOTE — Telephone Encounter (Signed)
Medication management - Message left for patient and then spoke to Weston Brass, pharmacist at pt's [facility] in [place], to inform, per Dr.Akhtar, patient needs to make a new appointment and then would consider filling until he can be seen.

## 2022-05-08 NOTE — Telephone Encounter (Signed)
Medication refill request - Fax from pt's [facility] on [address] [place] for a new Sertraline 90 day order, #[id number], last provided 01/03/22 and filled on 02/08/22. Patient last seen 11/22/21 and in need of a new appt.

## 2022-05-09 ENCOUNTER — Ambulatory Visit (INDEPENDENT_AMBULATORY_CARE_PROVIDER_SITE_OTHER): Payer: 59 | Admitting: Family Medicine

## 2022-05-09 ENCOUNTER — Encounter (INDEPENDENT_AMBULATORY_CARE_PROVIDER_SITE_OTHER): Payer: Self-pay | Admitting: Family Medicine

## 2022-05-09 VITALS — BP 140/98 | HR 75 | Temp 98.5°F | Ht 73.0 in | Wt 317.4 lb

## 2022-05-09 DIAGNOSIS — I110 Hypertensive heart disease with heart failure: Secondary | ICD-10-CM | POA: Diagnosis not present

## 2022-05-09 DIAGNOSIS — I10 Essential (primary) hypertension: Secondary | ICD-10-CM

## 2022-05-09 DIAGNOSIS — F39 Unspecified mood [affective] disorder: Secondary | ICD-10-CM

## 2022-05-09 DIAGNOSIS — Z6841 Body Mass Index (BMI) 40.0 and over, adult: Secondary | ICD-10-CM

## 2022-05-09 DIAGNOSIS — R739 Hyperglycemia, unspecified: Secondary | ICD-10-CM

## 2022-05-09 DIAGNOSIS — Z1331 Encounter for screening for depression: Secondary | ICD-10-CM

## 2022-05-09 DIAGNOSIS — R0602 Shortness of breath: Secondary | ICD-10-CM | POA: Insufficient documentation

## 2022-05-09 DIAGNOSIS — I5022 Chronic systolic (congestive) heart failure: Secondary | ICD-10-CM

## 2022-05-09 DIAGNOSIS — G4733 Obstructive sleep apnea (adult) (pediatric): Secondary | ICD-10-CM | POA: Diagnosis not present

## 2022-05-09 DIAGNOSIS — R5383 Other fatigue: Secondary | ICD-10-CM

## 2022-05-09 NOTE — Progress Notes (Signed)
Kevin Baxter, D.O.  ABFM, ABOM Specializing in Clinical Bariatric Medicine Office located at: [address] [place], [place]  [address]     Initial Bariatric Medicine Consultation Visit  Dear Kevin Plump, MD   Thank you for referring Kevin Baxter to our clinic today for evaluation.  We performed a consultation to discuss his options for treatment and educate the patient on his disease state.  The following note includes my evaluation and treatment recommendations.   Please do not hesitate to reach out to me directly if you have any further concerns.    Assessment and Plan:   Orders Placed This Encounter  Procedures   CBC with Differential/Platelet   Comprehensive metabolic panel   Hemoglobin A1c   Insulin, random   Lipid Panel With LDL/HDL Ratio   T4, free   TSH   Vitamin B12   VITAMIN D 25 Hydroxy (Vit-D Deficiency, Fractures)     1) Fatigue Assessment: Condition is Not optimized. Kevin Baxter does feel that his weight is causing his energy to be lower than it should be. Fatigue may be related to obesity, depression or many other causes. he does not appear to have any red flag symptoms and this appears to most likely related to his current lifestyle habits and dietary intake.  Plan: Check labs EKG: Normal sinus rhythm, rate 76 bpm. No acute abnormalities. EKG was done on 02/11/22, with his cardiologist Dr.Tracy Baxter, no change from prior EKG Labs will be ordered and reviewed with him at their next office visit in two weeks In the meanwhile, Kevin Baxter will focus on self care including making healthy food choices by following their meal plan, improving sleep quality and focusing on stress reduction.  Once we are assured he is on an appropriate meal plan, we will start discussing exercise to increase cardiovascular fitness levels.    2) Shortness of breath on exertion Assessment: Condition is Not optimized. Kevin Baxter does feel that he gets out of breath more easily  than he used to when he exercises and seems to be worsening over time with weight gain.  This has gotten worse recently. Kevin Baxter denies shortness of breath at rest or orthopnea. Kevin Baxter's shortness of breath appears to be obesity related and exercise induced, as they do not appear to have any "red flag" symptoms/ concerns today.  Also, this condition appears to be related to a state of poor cardiovascular conditioning   Plan: Check labs Indirect Calorimeter completed today shows a VO2 of 328 and a REE of 2261.  His calculated basal metabolic rate is 6045 thus his measured basal metabolic rate is worse than expected. Patient agreed to work on weight loss at this time.  As Kevin Baxter progresses through our weight loss program, we will gradually increase exercise as tolerated to treat his current condition.   If Kevin Baxter follows our recommendations and loses 5-10% of their weight without improvement of his shortness of breath or if at any time, symptoms become more concerning, they agree to urgently follow up with their PCP/ specialist for further consideration/ evaluation.   Kevin Baxter verbalizes agreement with this plan.    Essential hypertension Assessment: Condition is Not optimized. Last 3 blood pressure readings in our office are as follows: BP Readings from Last 3 Encounters:  05/09/22 (!) 140/98  04/07/22 (!) 146/95  03/13/22 127/80  He has been compliant Coreg and Aldactone. Denies any side effects. Asymptomatic.  Plan: Check labs.Continue with meds as recommended by pcp/specialist.  -Begin  with Prudent nutritional plan and low sodium diet, advance exercise as tolerated.   - We will continue to monitor closely alongside PCP/ specialists.  Pt reminded to also f/up with those individuals as instructed by them.  - We will continue to monitor symptoms as they relate to the his weight loss journey.    Hyperglycemia Assessment: Condition is stable. Lab Results  Component Value Date   HGBA1C 5.6  08/17/2020   HGBA1C 6.3 (H) 04/11/2020   HGBA1C 6.2 12/03/2018  In the past, patient had elevated fasting sugars. Patient is not on any medication for this condition. It is currently diet controlled.  Plan: Check labs. Begin his prudent nutritional plan and continue to advance exercise and cardiovascular fitness as tolerated.    Chronic systolic congestive heart failure Assessment: Condition is Controlled. Patient endorses that this condition is "pretty well controlled", he denies any congestive heart failure symptoms today. Notes and medical record reviewed from cardiology and other specialists.  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  Pt was admitted in 3/22 for intractable n/v.   Initially felt to have acute cholecystitis.   Echo showed EF 15% with moderate RV dysfunction and a large LV blood clot.  Started on entresto, ivabradine and eliquis.   Post -cath developed worsening CP and tachycardia. 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  Underwent catheter-based thrombectomy of PE.   Discharged 04/24/20.  - Patient follows with Dr. Gala Baxter of cardiology usually once yearly for his CHF and follows with Dr. Mayford Baxter of cardiology more frequently for general cardiac and sleep apnea issues.  He is compliant and tolerant with Eliquis, Corlanor, and Entresto.  Denies any side effects.   Plan:  Stable Continue meds as per specialist.  Continue with treatment of condition as per specialists.    OSA on CPAP Assessment: Condition is stable. Patient underwent home sleep study on 08/29/2021 - showing moderate obstructive sleep apnea. He wears a CPAP every night as per his sleep doctor- Dr Kevin Baxter of [facility].  Plan: Intensive lifestyle modifications are the first line treatment for this issue. We discussed several lifestyle modifications today and he will continue to work on diet, exercise and weight  loss efforts. We will continue to monitor. Orders and follow up as documented in patient record. Educated pt that OSA is a cause of systemic hypertension and is associated with an increased incidence of stroke, heart failure, atrial fibrillation, and coronary heart disease.  Severe OSA increases all-cause mortality and cardiovascular mortality.  -Goal: Treatment of OSA via CPAP compliance and weight loss. Plasma ghrelin levels (appetite or "hunger hormone") are significantly higher in OSA patients than in BMI-matched controls, but decrease to levels similar to those of obese patients without OSA after CPAP treatment.  Weight loss improves OSA by several mechanisms, including reduction in fatty tissue in the throat (i.e. parapharyngeal fat) and the tongue. Loss of abdominal fat increases mediastinal traction on the upper airway making it less likely to collapse during sleep. Studies have also shown that compliance with CPAP treatment improves leptin imbalance.   Mood disorder-Emotional Eating Assessment: Condition is Not optimized. Patient endorses that he eats when stressed, bored, to comfort himself, and as a reward. Patient also endorses that occasionally he feels that his weight problem is so hopeless that sometimes life doesn't seem worth living. He is compliant with Zoloft 50 mg daily for anxiety.  Plan: Check labs. Continue with med as  recommended by specialist. I encouraged him to discuss with Dr.Akhtar about possibly seeing a life coach for mood and anxiety problems. Patient was referred to Dr. Dewaine Conger, our Bariatric Psychologist, for evaluation due to his elevated PHQ-9 score and significant struggles with emotional eating.   TREATMENT PLAN FOR OBESITY BMI 40.0-44.9, adult-current bmi 41.9 Morbid obesity-start bmi 41.9/date 05/09/22 Assessment: Condition is Not optimized. Biometric data collected today, was reviewed with patient.  Fat mass is 124.4 lbs. Muscle mass is 183.6 lbs. Total body  water is 130.8 lbs.  Plan: Check labs. Kevin Baxter will work on healthier eating habits and try their best to follow the Category 3 meal plan to the best of his ability and measuring lean protein intake.   Behavioral Intervention Additional resources provided today: category 3 meal plan information Evidence-based interventions for health behavior change were utilized today including the discussion of self monitoring techniques, problem-solving barriers and SMART goal setting techniques.   Regarding patient's less desirable eating habits and patterns, we employed the technique of small changes.  Pt will specifically work on: following the Category 3 meal plan and measuring lean protein intake  for next visit.    Recommended Physical Activity Goals Kevin Baxter has been advised to work up to 150 minutes of moderate intensity aerobic activity a week and strengthening exercises 2-3 times per week for cardiovascular health, weight loss maintenance and preservation of muscle mass.  He has agreed to continue their current level of activity  FOLLOW UP: Follow up in 2 weeks. He was informed of the importance of frequent follow up visits to maximize his success with intensive lifestyle modifications for his multiple health conditions.  Kevin Baxter is aware that we will review all of his lab results at our next visit.  He is aware that if anything is critical/ life threatening with the results, we will be contacting him via MyChart prior to the office visit to discuss management.     Chief Complaint:   OBESITY Kevin Baxter (MR# [id number]) is a 52 y.o. male who presents for evaluation and treatment of obesity and related comorbidities. Current BMI is Body mass index is 41.88 kg/m. Quantrell has been struggling with his weight for many years and has been unsuccessful in either losing weight, maintaining weight loss, or reaching his healthy weight goal.  Kevin Baxter is currently in the action stage  of change and ready to dedicate time achieving and maintaining a healthier weight. Keoki is interested in becoming our patient and working on intensive lifestyle modifications including (but not limited to) diet and exercise for weight loss.  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 Patient  lives with his wife and 63 y/o son and 81 y/o daughter.  He attributes his weight gain to not moving enough. He has never followed a meal/diet plan in the past.  He craves donuts, cake, and food all day. He snacks on donuts.   He eats out 3-4 times a week.   He drinks regular sodas every day and other caloric beverages.   His worst food habit is eating sweets. He endorses he feels out of control with eating and eats larger portions than he should.  Depression Screen His Food and Mood (modified PHQ-9) score was 22.  Sleep habits His ESS score is 5.  Kylo admits to  occasional daytime somnolence and admits to occasionally waking up still tired.  Joell generally gets 7 hours of sleep per  night, and states that he has somewhat restful sleep. Snoring is not present. Apneic episodes is present.    Subjective:   This is the patient's first visit at [facility].  The patient's NEW PATIENT PACKET that they filled out prior to today's office visit was reviewed at length and information from that paperwork was included within the following office visit note.    Included in the packet: current and past health history, medications, allergies, ROS, gynecologic history (women only), surgical history, family history, social history, weight history, weight loss surgery history (for those that have had weight loss surgery), nutritional evaluation, mood and food questionnaire along with a depression screening (PHQ9) on all patients, an Epworth questionnaire, sleep habits questionnaire, patient life and health improvement goals questionnaire. These will all be scanned  into the patient's chart under the "media" tab.   Review of Systems: Please refer to new patient packet scanned into media. Pertinent positives were addressed with patient today.   Objective:   PHYSICAL EXAM: Blood pressure (!) 140/98, pulse 75, temperature 98.5 F (36.9 C), height 6\' 1"  (1.854 m), weight (!) 317 lb 6.4 oz (144 kg), SpO2 99 %. Body mass index is 41.88 kg/m. General: Well Developed, well nourished, and in no acute distress.  HEENT: Normocephalic, atraumatic Skin: Warm and dry, cap RF less 2 sec, good turgor Chest:  Normal excursion, shape, no gross abn Respiratory: speaking in full sentences, no conversational dyspnea NeuroM-Sk: Ambulates w/o assistance, moves * 4 Psych: A and O *3, insight good, mood-full   Anthropometric Measurements Height: 6\' 1"  (1.854 m) Weight: (!) 317 lb 6.4 oz (144 kg) BMI (Calculated): 41.89 Starting Weight: 317lb Peak Weight: 333lb Waist Measurement : 56 inches   Body Composition  Body Fat %: 39.2 % Fat Mass (lbs): 124.4 lbs Muscle Mass (lbs): 183.6 lbs Total Body Water (lbs): 130.8 lbs Visceral Fat Rating : 24   Other Clinical Data RMR: [id number] Fasting: yes Labs: yes Today's Visit #: 1 Starting Date: 05/09/22    DIAGNOSTIC DATA REVIEWED:  BMET    Component Value Date/Time   NA 134 (L) 02/10/2022 1208   K 4.3 02/10/2022 1208   CL 100 02/10/2022 1208   CO2 24 02/10/2022 1208   GLUCOSE 101 (H) 02/10/2022 1208   GLUCOSE 90 11/22/2005 1016   BUN 18 02/10/2022 1208   CREATININE 1.27 (H) 02/10/2022 1208   CALCIUM 9.3 02/10/2022 1208   GFRNONAA >60 02/10/2022 1208   GFRAA 108 10/03/2007 1056   Lab Results  Component Value Date   HGBA1C 5.6 08/17/2020   HGBA1C 6.2 01/17/2013   No results found for: "INSULIN" Lab Results  Component Value Date   TSH 2.246 04/12/2020   CBC    Component Value Date/Time   WBC 5.7 02/10/2022 1208   RBC 7.55 (H) 02/10/2022 1208   HGB 19.4 (H) 02/10/2022 1208   HCT 60.7 (H)  02/10/2022 1208   PLT 232 02/10/2022 1208   MCV 80.4 02/10/2022 1208   MCH 25.7 (L) 02/10/2022 1208   MCHC 32.0 02/10/2022 1208   RDW 17.7 (H) 02/10/2022 1208   Iron Studies    Component Value Date/Time   IRON 27 (L) 04/20/2020 1048   TIBC 231 (L) 04/20/2020 1048   FERRITIN 853 (H) 04/20/2020 1048   IRONPCTSAT 12 (L) 04/20/2020 1048   Lipid Panel     Component Value Date/Time   CHOL 212 (H) 08/17/2020 1055   TRIG 123.0 08/17/2020 1055   TRIG 69 11/22/2005 1016  HDL 63.30 08/17/2020 1055   CHOLHDL 3 08/17/2020 1055   VLDL 24.6 08/17/2020 1055   LDLCALC 124 (H) 08/17/2020 1055   Hepatic Function Panel     Component Value Date/Time   PROT 7.9 12/10/2020 1201   ALBUMIN 3.8 12/10/2020 1201   AST 17 12/10/2020 1201   ALT 19 12/10/2020 1201   ALKPHOS 60 12/10/2020 1201   BILITOT 1.4 (H) 12/10/2020 1201   BILIDIR 0.9 (H) 04/24/2020 0037   IBILI 1.5 (H) 04/24/2020 0037      Component Value Date/Time   TSH 2.246 04/12/2020 0027   TSH 1.59 12/03/2018 1618   Nutritional No results found for: "VD25OH"  Attestation Statements:   Reviewed by clinician on day of visit: allergies, medications, problem list, medical history, surgical history, family history, social history, and previous encounter notes.  During the visit, I independently reviewed the patient's EKG, bioimpedance scale results, and indirect calorimeter results. I used this information to tailor a meal plan for the patient that will help Kevin Baxter to lose weight and will improve his obesity-related conditions going forward.  I performed a medically necessary appropriate examination and/or evaluation. I discussed the assessment and treatment plan with the patient. The patient was provided an opportunity to ask questions and all were answered. The patient agreed with the plan and demonstrated an understanding of the instructions. Labs were ordered today (unless patient declined them) and will be reviewed with the  patient at our next visit unless more critical results need to be addressed immediately. Clinical information was updated and documented in the EMR.  Time spent on visit including pre-visit chart review and post-visit care was estimated to be 60  minutes.    I,Special Puri,acting as a Neurosurgeon for [employer], DO.,have documented all relevant documentation on the behalf of Kevin Lot, DO,as directed by  Kevin Lot, DO while in the presence of Kevin Lot, DO.   I, Kevin Lot, DO, have reviewed all documentation for this visit. The documentation on 05/09/22 for the exam, diagnosis, procedures, and orders are all accurate and complete.

## 2022-05-10 LAB — CBC WITH DIFFERENTIAL/PLATELET
Basophils Absolute: 0 10*3/uL (ref 0.0–0.2)
Basos: 0 %
EOS (ABSOLUTE): 0.3 10*3/uL (ref 0.0–0.4)
Eos: 6 %
Hematocrit: 57.1 % — ABNORMAL HIGH (ref 37.5–51.0)
Hemoglobin: 18.2 g/dL — ABNORMAL HIGH (ref 13.0–17.7)
Immature Grans (Abs): 0 10*3/uL (ref 0.0–0.1)
Immature Granulocytes: 0 %
Lymphocytes Absolute: 1.7 10*3/uL (ref 0.7–3.1)
Lymphs: 34 %
MCH: 26 pg — ABNORMAL LOW (ref 26.6–33.0)
MCHC: 31.9 g/dL (ref 31.5–35.7)
MCV: 82 fL (ref 79–97)
Monocytes Absolute: 0.4 10*3/uL (ref 0.1–0.9)
Monocytes: 9 %
Neutrophils Absolute: 2.5 10*3/uL (ref 1.4–7.0)
Neutrophils: 51 %
Platelets: 210 10*3/uL (ref 150–450)
RBC: 6.99 x10E6/uL — ABNORMAL HIGH (ref 4.14–5.80)
RDW: 17.8 % — ABNORMAL HIGH (ref 11.6–15.4)
WBC: 4.9 10*3/uL (ref 3.4–10.8)

## 2022-05-10 LAB — COMPREHENSIVE METABOLIC PANEL
ALT: 19 IU/L (ref 0–44)
AST: 17 IU/L (ref 0–40)
Albumin/Globulin Ratio: 1.3 (ref 1.2–2.2)
Albumin: 4.2 g/dL (ref 3.8–4.9)
Alkaline Phosphatase: 77 IU/L (ref 44–121)
BUN/Creatinine Ratio: 11 (ref 9–20)
BUN: 13 mg/dL (ref 6–24)
Bilirubin Total: 0.9 mg/dL (ref 0.0–1.2)
CO2: 25 mmol/L (ref 20–29)
Calcium: 9.7 mg/dL (ref 8.7–10.2)
Chloride: 101 mmol/L (ref 96–106)
Creatinine, Ser: 1.17 mg/dL (ref 0.76–1.27)
Globulin, Total: 3.2 g/dL (ref 1.5–4.5)
Glucose: 83 mg/dL (ref 70–99)
Potassium: 4.7 mmol/L (ref 3.5–5.2)
Sodium: 142 mmol/L (ref 134–144)
Total Protein: 7.4 g/dL (ref 6.0–8.5)
eGFR: 75 mL/min/{1.73_m2} (ref >59)

## 2022-05-10 LAB — INSULIN, RANDOM: INSULIN: 25.7 u[IU]/mL — ABNORMAL HIGH (ref 2.6–24.9)

## 2022-05-10 LAB — VITAMIN D 25 HYDROXY (VIT D DEFICIENCY, FRACTURES): Vit D, 25-Hydroxy: 16.5 ng/mL — ABNORMAL LOW (ref 30.0–100.0)

## 2022-05-10 LAB — LIPID PANEL WITH LDL/HDL RATIO
Cholesterol, Total: 201 mg/dL — ABNORMAL HIGH (ref 100–199)
HDL: 59 mg/dL (ref >39)
LDL Chol Calc (NIH): 119 mg/dL — ABNORMAL HIGH (ref 0–99)
LDL/HDL Ratio: 2 ratio (ref 0.0–3.6)
Triglycerides: 132 mg/dL (ref 0–149)
VLDL Cholesterol Cal: 23 mg/dL (ref 5–40)

## 2022-05-10 LAB — TSH: TSH: 2.4 u[IU]/mL (ref 0.450–4.500)

## 2022-05-10 LAB — VITAMIN B12: Vitamin B-12: 487 pg/mL (ref 232–1245)

## 2022-05-10 LAB — T4, FREE: Free T4: 1.04 ng/dL (ref 0.82–1.77)

## 2022-05-10 LAB — HEMOGLOBIN A1C
Est. average glucose Bld gHb Est-mCnc: 126 mg/dL
Hgb A1c MFr Bld: 6 % — ABNORMAL HIGH (ref 4.8–5.6)

## 2022-05-16 NOTE — Progress Notes (Signed)
Office: [phone]  /  Fax: [phone]    Date: 05/29/2022   Appointment Start Time: 9:02am Duration: 48 minutes Provider: Lawerance Cruel, Psy.D. Type of Session: Intake for Individual Therapy  Location of Patient: Home (private location) Location of Provider: Provider's home (private office) Type of Contact: Telepsychological Visit via MyChart Video Visit  Informed Consent: Prior to proceeding with today's appointment, two pieces of identifying information were obtained. In addition, Kevin Baxter's physical location at the time of this appointment was obtained as well a phone number he could be reached at in the event of technical difficulties. Tully and this provider participated in today's telepsychological service.   The provider's role was explained to Kevin Baxter. The provider reviewed and discussed issues of confidentiality, privacy, and limits therein (e.g., reporting obligations). In addition to verbal informed consent, written informed consent for psychological services was obtained prior to the initial appointment. Since the clinic is not a 24/7 crisis center, mental health emergency resources were shared and this  provider explained MyChart, e-mail, voicemail, and/or other messaging systems should be utilized only for non-emergency reasons. This provider also explained that information obtained during appointments will be placed in Kevin Baxter's medical record and relevant information will be shared with other providers at [facility] for coordination of care. Kevin Baxter agreed information may be shared with other [facility] providers as needed for coordination of care and by signing the service agreement document, he provided written consent for coordination of care. Prior to initiating telepsychological services, Kevin Baxter completed an informed consent document, which included the development of a safety plan (i.e., an emergency contact and emergency resources) in  the event of an emergency/crisis. Kevin Baxter verbally acknowledged understanding he is ultimately responsible for understanding his insurance benefits for telepsychological and in-person services. This provider also reviewed confidentiality, as it relates to telepsychological services. Kevin Baxter  acknowledged understanding that appointments cannot be recorded without both party consent and he is aware he is responsible for securing confidentiality on his end of the session. Kevin Baxter verbally consented to proceed.  Chief Complaint/HPI: Kevin Baxter was referred by Kevin Baxter due to  mood disorder-emotional eating . Per the note for the visit with Kevin Baxter on 05/09/2022, "Patient endorses that he eats when stressed, bored, to comfort himself, and as a reward. Patient also endorses that occasionally he feels that his weight problem is so hopeless that sometimes life doesn't seem worth living. He is compliant with Zoloft 50 mg daily for anxiety. "   During today's appointment, Kevin Baxter was verbally administered a questionnaire assessing various behaviors related to emotional eating behaviors. Kevin Baxter endorsed the following: overeat when you are celebrating, experience food cravings on a regular basis, eat certain foods when you are anxious, stressed, depressed, or your feelings are hurt, use food to help you cope with emotional situations, find food is comforting to you, overeat when you are worried about something, overeat frequently when you are bored or lonely, not worry about what you eat when you are in a good mood, overeat when you are alone, but eat much less when you are with other people, and eat as a reward. He shared he craves sweets (e.g., donuts). Kevin Baxter believes the onset of emotional eating behaviors was likely in childhood, noting an exacerbation in the past two years since he started working from home. He described the current frequency of emotional eating behaviors as "few times a week." In  addition, Kevin Baxter denied a history of binge eating behaviors. Kevin Baxter  denied a history of significantly restricting food intake, purging and engagement in other compensatory strategies, and has never been diagnosed with an eating disorder. He also denied a history of treatment for emotional eating behaviors. Furthermore, Kevin Baxter denied other problems of concern.    Mental Status Examination:  Appearance: neat Behavior: appropriate to circumstances Mood: neutral Affect: mood congruent Speech: WNL Eye Contact: appropriate Psychomotor Activity: WNL Gait: unable to assess  Thought Process: linear, logical, and goal directed and denies suicidal, homicidal, and self-harm ideation, plan and intent  Thought Content/Perception: no hallucinations, delusions, bizarre thinking or behavior endorsed or observed Orientation: AAOx4 Memory/Concentration: memory, attention, language, and fund of knowledge intact  Insight/Judgment: fair  Family & Psychosocial History: Kevin Baxter reported he is married and he has two children (ages 61 and 33). He indicated he is currently employed with [employer] as a Merchandiser, retail. Additionally, Akari shared his highest level of education obtained is "some college." Currently, Kevin Baxter's social support system consists of his wife, children, and siblings. Moreover, Kevin Baxter stated he resides with his wife and children.   Medical History:  Past Medical History:  Diagnosis Date   Allergy    Anxiety    Arthritis    Back pain    CHF (congestive heart failure) (HCC)    Constipation    Depression    ED (erectile dysfunction)    Fundic gland polyposis of stomach    GERD with esophagitis    Gout    History of blood clots    Hx of adenomatous colonic polyps    Hypertension    IBS (irritable bowel syndrome)    Joint pain    Lactose intolerance    OSA on CPAP    Palpitations    Sleep apnea    Past Surgical History:  Procedure Laterality Date   COLONOSCOPY  05/2019    ESOPHAGOGASTRODUODENOSCOPY  05/2019   HAND SURGERY Right    2006 for a FX   IR ANGIOGRAM PULMONARY BILATERAL SELECTIVE  04/16/2020   IR ANGIOGRAM SELECTIVE EACH ADDITIONAL VESSEL  04/16/2020   IR ANGIOGRAM SELECTIVE EACH ADDITIONAL VESSEL  04/16/2020   IR THROMBECT PRIM MECH INIT (INCLU) MOD SED  04/16/2020   IR US GUIDE VASC ACCESS LEFT  04/16/2020   IR US GUIDE VASC ACCESS LEFT  04/16/2020   IR US GUIDE VASC ACCESS RIGHT  04/16/2020   RADIOLOGY WITH ANESTHESIA N/A 04/16/2020   Procedure: IR WITH ANESTHESIA- PULMONARY EMBOLUS INTERVENTION;  Surgeon: Bennie Dallas, MD;  Location: [facility] OR;  Service: Radiology;  Laterality: N/A;   RIGHT/LEFT HEART CATH AND CORONARY ANGIOGRAPHY N/A 04/15/2020   Procedure: RIGHT/LEFT HEART CATH AND CORONARY ANGIOGRAPHY;  Surgeon: Baxter Patty, MD;  Location: [facility] INVASIVE CV [facility];  Service: Cardiovascular;  Laterality: N/A;   Current Outpatient Medications on File Prior to Visit  Medication Sig Dispense Refill   allopurinol (ZYLOPRIM) 300 MG tablet Take 1 tablet (300 mg total) by mouth daily. 90 tablet 1   carvedilol (COREG) 6.25 MG tablet TAKE 1 TABLET BY MOUTH TWICE A DAY WITH MEALS 180 tablet 2   ELIQUIS 5 MG TABS tablet TAKE 1 TABLET BY MOUTH TWICE A DAY 180 tablet 1   ivabradine (CORLANOR) 5 MG TABS tablet Take 1 tablet (5 mg total) by mouth 2 (two) times daily with a meal. 180 tablet 1   JARDIANCE 10 MG TABS tablet TAKE 1 TABLET BY MOUTH EVERY DAY 90 tablet 3   sacubitril-valsartan (ENTRESTO) 49-51 MG Take 1 tablet by mouth 2 (  two) times daily. 60 tablet 11   sertraline (ZOLOFT) 100 MG tablet Take 1 tablet (100 mg total) by mouth daily. 90 tablet 3   sildenafil (VIAGRA) 50 MG tablet Take 1 tablet (50 mg total) by mouth daily as needed for erectile dysfunction. 10 tablet 6   sodium chloride (OCEAN) 0.65 % SOLN nasal spray Place 2 sprays into both nostrils 2 (two) times daily. 15 mL 4   spironolactone (ALDACTONE) 25 MG tablet TAKE 1/2 TABLET BY MOUTH EVERY DAY  45 tablet 4   Vitamin D, Ergocalciferol, (DRISDOL) 1.25 MG (50000 UNIT) CAPS capsule Take 1 capsule (50,000 Units total) by mouth every 7 (seven) days. 4 capsule 0   [DISCONTINUED] buPROPion (WELLBUTRIN) 100 MG tablet Take 1 tablet (100 mg total) by mouth 2 (two) times daily. 180 tablet 0   No current facility-administered medications on file prior to visit.  Syre stated he is medication compliant.   Mental Health History: Hanson reported attending therapeutic services to address "anxiety with the heart failure" in 2020-06-23. He stated his PCP currently prescribes Wellbutrin and his psychiatrist prescribed Zoloft, noting he has not met with his psychiatrist recently. Herrick reported there is no history of hospitalizations for psychiatric concerns. Nolton endorsed a family history of mental health related concerns (maternal aunt; unknown diagnosis(es); anxiety (niece); and schizophrenia (nephews). Gunnar reported there is no history of trauma including psychological, physical , and sexual abuse, as well as neglect. However, he described heart failure as traumatic.   Dione reported experiencing passive suicidal ideation ("After going thru all this [health issues], I would be better off dead.") in Jun 23, 2020. He explained he experienced the aforementioned as he was in recovery. Kevin Baxter denied suicidal plan and intent. He indicated he last experienced suicidal ideation in 06-23-20. The following protective factors were identified for Kevin Baxter: family (wife and children). If he were to become overwhelmed in the future, which is a sign that a crisis may occur, he identified the following coping skills he could engage in: pray, lean on family and friends, listen to music, and ride motorcycle. It was recommended the aforementioned be written down and developed into a coping card for future reference. Psychoeducation regarding the importance of reaching out to a trusted individual and/or utilizing emergency resources if there is a  change in emotional status and/or there is an inability to ensure safety was provided. Kevin Baxter's confidence in reaching out to a trusted individual and/or utilizing emergency resources should there be an intensification in emotional status and/or there is an inability to ensure safety was assessed on a scale of one to ten where one is not confident and ten is extremely confident. He reported his confidence is a 10.   Kevin Baxter described his typical mood lately as "okay." He added, he recently received "good news" from his cardiologist and has recently lost weight. Additionally, Kevin Baxter described his job as "stressful." He described experiencing "anxiety" and "pressure" related to leading a healthy lifestyle and its impact on work/family. He also discussed a history of panic attacks, noting the last time was 2-3 years ago. Notably, he discussed experiencing some memory related issues starting in 06/23/2020 after his illness and recovery, adding his wife will point out he forgot something. He indicated he continues to remember important things. He will follow-up with his PCP. Kevin Baxter denied current alcohol use. He denied tobacco use. He denied illicit/recreational substance use. Furthermore, Kevin Baxter indicated he is not experiencing the following: hallucinations and delusions, paranoia, symptoms of mania , social withdrawal, crying spells,  attention and concentration issues, and obsessions and compulsions. He also denied current suicidal ideation, plan, and intent; history of and current homicidal ideation, plan, and intent; and history of and current engagement in self-harm.  Legal History: Kevin Baxter reported there is no history of legal involvement.   Structured Assessments Results: The Patient Health Questionnaire-9 (PHQ-9) is a self-report measure that assesses symptoms and severity of depression over the course of the last two weeks. Mccormick obtained a score of 0. [0= Not at all; 1= Several days; 2= More than half the days;  3= Nearly every day] Little interest or pleasure in doing things 0  Feeling down, depressed, or hopeless 0  Trouble falling or staying asleep, or sleeping too much 0  Feeling tired or having little energy 0  Poor appetite or overeating 0  Feeling bad about yourself --- or that you are a failure or have let yourself or your family down 0  Trouble concentrating on things, such as reading the newspaper or watching television 0  Moving or speaking so slowly that other people could have noticed? Or the opposite --- being so fidgety or restless that you have been moving around a Baxter more than usual 0  Thoughts that you would be better off dead or hurting yourself in some way 0  PHQ-9 Score 0    The Generalized Anxiety Disorder-7 (GAD-7) is a brief self-report measure that assesses symptoms of anxiety over the course of the last two weeks. Kevin Baxter obtained a score of 3 suggesting minimal anxiety. Kevin Baxter finds the endorsed symptoms to be not difficult at all. [0= Not at all; 1= Several days; 2= Over half the days; 3= Nearly every day] Feeling nervous, anxious, on edge-due to work 3  Not being able to stop or control worrying 0  Worrying too much about different things 0  Trouble relaxing 0  Being so restless that it's hard to sit still 0  Becoming easily annoyed or irritable 0  Feeling afraid as if something awful might happen 0  GAD-7 Score 3   Interventions:  Conducted a chart review Focused on rapport building Verbally administered PHQ-9 and GAD-7 for symptom monitoring Verbally administered Food & Mood questionnaire to assess various behaviors related to emotional eating Provided emphatic reflections and validation Collaborated with patient on a treatment goal  Psychoeducation provided regarding physical versus emotional hunger Conducted a risk assessment Recommended/discussed option for longer-term therapeutic services  Diagnostic Impressions & Provisional DSM-5 Diagnosis(es): Kevin Baxter  reported a history of engagement in emotional eating behaviors starting in childhood, and described the current frequency as "few times a week." He denied engagement in any other disordered eating behaviors. Moreover, Terell described experiencing "pressure" and "anxiety" secondary to health-related concerns. Based on the aforementioned, the following diagnoses were assigned: F50.89 Other Specified Feeding or Eating Disorder, Emotional Eating Behaviors and  F43.20 Adjustment Disorder, Unspecified .  Plan: Zi appears able and willing to participate as evidenced by collaboration on a treatment goal, engagement in reciprocal conversation, and asking questions as needed for clarification. The next appointment is scheduled for 06/12/2022 at 8:30am, which will be via MyChart Video Visit. The following treatment goal was established: increase coping skills. This provider will regularly review the treatment plan and medical chart to keep informed of status changes. Itai expressed understanding and agreement with the initial treatment plan of care. Waldemar will be sent a handout via e-mail to utilize between now and the next appointment to increase awareness of hunger patterns and subsequent eating. Kevin Baxter  provided verbal consent during today's appointment for this provider to send the handout via e-mail. Additionally, Milus reported a plan to reach out to his previous therapist to re-establish care.

## 2022-05-17 ENCOUNTER — Ambulatory Visit (HOSPITAL_BASED_OUTPATIENT_CLINIC_OR_DEPARTMENT_OTHER)
Admission: RE | Admit: 2022-05-17 | Discharge: 2022-05-17 | Disposition: A | Discharge status: Home / Self Care | Payer: 59 | Source: Ambulatory Visit | Attending: Internal Medicine | Admitting: Internal Medicine

## 2022-05-17 ENCOUNTER — Encounter (HOSPITAL_COMMUNITY): Payer: Self-pay | Admitting: Internal Medicine

## 2022-05-17 ENCOUNTER — Ambulatory Visit (HOSPITAL_COMMUNITY)
Admission: RE | Admit: 2022-05-17 | Discharge: 2022-05-17 | Disposition: A | Discharge status: Home / Self Care | Payer: 59 | Source: Ambulatory Visit | Attending: Internal Medicine | Admitting: Internal Medicine

## 2022-05-17 VITALS — BP 144/80 | HR 76 | Wt 313.4 lb

## 2022-05-17 DIAGNOSIS — F32A Depression, unspecified: Secondary | ICD-10-CM | POA: Insufficient documentation

## 2022-05-17 DIAGNOSIS — I10 Essential (primary) hypertension: Secondary | ICD-10-CM

## 2022-05-17 DIAGNOSIS — Z87891 Personal history of nicotine dependence: Secondary | ICD-10-CM | POA: Diagnosis not present

## 2022-05-17 DIAGNOSIS — I110 Hypertensive heart disease with heart failure: Secondary | ICD-10-CM | POA: Insufficient documentation

## 2022-05-17 DIAGNOSIS — Z8249 Family history of ischemic heart disease and other diseases of the circulatory system: Secondary | ICD-10-CM | POA: Insufficient documentation

## 2022-05-17 DIAGNOSIS — Z7901 Long term (current) use of anticoagulants: Secondary | ICD-10-CM | POA: Diagnosis not present

## 2022-05-17 DIAGNOSIS — I2609 Other pulmonary embolism with acute cor pulmonale: Secondary | ICD-10-CM | POA: Diagnosis not present

## 2022-05-17 DIAGNOSIS — I513 Intracardiac thrombosis, not elsewhere classified: Secondary | ICD-10-CM | POA: Diagnosis not present

## 2022-05-17 DIAGNOSIS — Z86711 Personal history of pulmonary embolism: Secondary | ICD-10-CM | POA: Insufficient documentation

## 2022-05-17 DIAGNOSIS — Z79899 Other long term (current) drug therapy: Secondary | ICD-10-CM | POA: Insufficient documentation

## 2022-05-17 DIAGNOSIS — I5082 Biventricular heart failure: Secondary | ICD-10-CM | POA: Diagnosis present

## 2022-05-17 DIAGNOSIS — I5022 Chronic systolic (congestive) heart failure: Secondary | ICD-10-CM

## 2022-05-17 DIAGNOSIS — F419 Anxiety disorder, unspecified: Secondary | ICD-10-CM | POA: Diagnosis not present

## 2022-05-17 LAB — ECHOCARDIOGRAM COMPLETE
Area-P 1/2: 5.46 cm2
Calc EF: 43.2 %
S' Lateral: 3.5 cm
Single Plane A2C EF: 42.8 %
Single Plane A4C EF: 40.9 %

## 2022-05-17 MED ORDER — ENTRESTO 49-51 MG PO TABS: 1.0000 | ORAL_TABLET | Freq: Two times a day (BID) | ORAL | 11 refills | Status: DC

## 2022-05-17 MED ORDER — SERTRALINE HCL 100 MG PO TABS: 100.0000 mg | ORAL_TABLET | Freq: Every day | ORAL | 3 refills | Status: AC

## 2022-05-17 NOTE — Patient Instructions (Signed)
INCREASE Zoloft to 100 mg daily.  INCREASE Entresto to 49/51 mg Twice daily  Your physician recommends that you schedule a follow-up appointment in: 6 months (October ) ** please call the office in July to arrange your follow up appointment. **  If you have any questions or concerns before your next appointment please send [place] a message through [place] or call our office at [phone].    TO LEAVE A MESSAGE FOR THE NURSE SELECT OPTION 2, PLEASE LEAVE A MESSAGE INCLUDING: YOUR NAME DATE OF BIRTH CALL BACK NUMBER REASON FOR CALL**this is important as we prioritize the call backs  YOU WILL RECEIVE A CALL BACK THE SAME DAY AS LONG AS YOU CALL BEFORE 4:00 PM  At the [facility], you and your health needs are our priority. As part of our continuing mission to provide you with exceptional heart care, we have created designated Provider Care Teams. These Care Teams include your primary Cardiologist (physician) and Advanced Practice Providers (APPs- Physician Assistants and Nurse Practitioners) who all work together to provide you with the care you need, when you need it.   You may see any of the following providers on your designated Care Team at your next follow up: Dr Arvilla Meres Dr Marca Ancona Dr. Marcos Eke, NP Robbie Lis, [place] [facility] [place], [place] Brynda Peon, NP Karle Plumber, PharmD   Please be sure to bring in all your medications bottles to every appointment.    Thank you for choosing [facility]

## 2022-05-17 NOTE — Progress Notes (Signed)
ReDS Vest / Clip - 05/17/22 1400       ReDS Vest / Clip   Station Marker D    Ruler Value 44    ReDS Value Range Low volume    ReDS Actual Value 25

## 2022-05-17 NOTE — Progress Notes (Signed)
ADVANCED HF CLINIC VISIT  PCP: [name], MD Primary Cardiologists: Armanda Magic, Lyrick Lagrand  HPI: Kevin Baxter is a 52 y.o. male with HTN, morbid obesity, pancreatitis, DVT with submassive PE and systolic HF due to NICM.  Admitted in 3/22 for intractable n/v. Initially felt to have acute cholecystitis. Echo showed EF 15% with mod RV dysfunction. + large LV clot. Started on inotropes. Cath showed normal coronaries. cMRI LVEF 12% Apical akinesis Elevated ECV in apex. RVEF 21%.   Post -cath developed worsening CP and tachycardia. CT showed bilateral submassive PE with pulmonary infarct. Underwent catheter-based thrombectomy of PE. Milrinone slowly weaned off. Discharged 04/24/20.    Echo 06/09/20 EF 25-30% RV mild HK ? Small laminated apical clot  Echo  12/10/20 EF 30-35% Apical AK with small apical thrombus RV ok  CMRI EF 33% RV 40% Apioal thrombus resolved.   Today he returns for HF follow up. Here with his wife. Working with [employer]. Says he is nervous about his heart. Compliant with his meds. Not exercising as much any more. Walking 30 mins 2-3x/week. No CP or SOB. Started going to weight management clinic last week.  Check BP regularly SBP 110-120   Echo today 05/17/22 EF 40-45% RV ok   ROS: All systems negative except as listed in HPI, PMH and Problem List.  SH:  Social History   Socioeconomic History   Marital status: Married    Spouse name: Not on file   Number of children: 2   Years of education: Not on file   Highest education level: Some college, no degree  Occupational History   Occupation: works for [employer]  Tobacco Use   Smoking status: Never   Smokeless tobacco: Never  Vaping Use   Vaping Use: Never used  Substance and Sexual Activity   Alcohol use: Not Currently    Comment: very rare    Drug use: No   Sexual activity: Not on file  Other Topics Concern   Not on file  Social History Narrative   Household: pt,  wife daughter (2013),  boy  (2006),    Social Determinants of Corporate investment banker Strain: Not on file  Food Insecurity: Not on file  Transportation Needs: Not on file  Physical Activity: Not on file  Stress: Not on file  Social Connections: Not on file  Intimate Partner Violence: Not on file    FH:  Family History  Problem Relation Age of Onset   Cancer Mother        CERVICAL   Hypertension Father    Heart failure Father        no MI, d/t etoh   Heart disease Father    Alcoholism Father    Diabetes Sister    Diabetes Sister    Diabetes Paternal Aunt    Diabetes Cousin    Colon cancer Neg Hx    Prostate cancer Neg Hx    Stroke Neg Hx    Esophageal cancer Neg Hx    Rectal cancer Neg Hx    Stomach cancer Neg Hx     Past Medical History:  Diagnosis Date   Allergy    Anxiety    Arthritis    Back pain    CHF (congestive heart failure)    Constipation  Depression    ED (erectile dysfunction)    Fundic gland polyposis of stomach    GERD with esophagitis    Gout    History of blood clots    Hx of adenomatous colonic polyps    Hypertension    IBS (irritable bowel syndrome)    Joint pain    Lactose intolerance    OSA on CPAP    Palpitations    Sleep apnea     Current Outpatient Medications  Medication Sig Dispense Refill   allopurinol (ZYLOPRIM) 300 MG tablet Take 1 tablet (300 mg total) by mouth daily. 90 tablet 1   carvedilol (COREG) 6.25 MG tablet TAKE 1 TABLET BY MOUTH TWICE A DAY WITH MEALS 180 tablet 2   ELIQUIS 5 MG TABS tablet TAKE 1 TABLET BY MOUTH TWICE A DAY 180 tablet 1   ENTRESTO 24-26 MG TAKE 1 TABLET BY MOUTH TWICE A DAY 60 tablet 6   ivabradine (CORLANOR) 5 MG TABS tablet Take 1 tablet (5 mg total) by mouth 2 (two) times daily with a meal. 180 tablet 1   JARDIANCE 10 MG TABS tablet TAKE 1 TABLET BY MOUTH EVERY DAY 90 tablet 3   sertraline (ZOLOFT) 50 MG tablet TAKE 1 AND 1/2 TABLETS BY MOUTH DAILY 135 tablet 0   sildenafil (VIAGRA) 50  MG tablet Take 1 tablet (50 mg total) by mouth daily as needed for erectile dysfunction. 10 tablet 6   spironolactone (ALDACTONE) 25 MG tablet TAKE 1/2 TABLET BY MOUTH EVERY DAY 45 tablet 4   sodium chloride (OCEAN) 0.65 % SOLN nasal spray Place 2 sprays into both nostrils 2 (two) times daily. (Patient not taking: Reported on 05/09/2022) 15 mL 4   No current facility-administered medications for this encounter.    Vitals:   05/17/22 1400  BP: (!) 144/80  Pulse: 76  SpO2: 97%  Weight: (!) 142.2 kg (313 lb 6.4 oz)    Wt Readings from Last 3 Encounters:  05/17/22 (!) 142.2 kg (313 lb 6.4 oz)  05/09/22 (!) 144 kg (317 lb 6.4 oz)  04/07/22 (!) 144.2 kg (318 lb)    PHYSICAL EXAM: General:  Well appearing. No resp difficulty HEENT: normal Neck: supple. no JVD. Carotids 2+ bilat; no bruits. No lymphadenopathy or thryomegaly appreciated. Cor: PMI nondisplaced. Regular rate & rhythm. No rubs, gallops or murmurs. Lungs: clear Abdomen: obese soft, nontender, nondistended. No hepatosplenomegaly. No bruits or masses. Good bowel sounds. Extremities: no cyanosis, clubbing, rash, edema Neuro: alert & orientedx3, cranial nerves grossly intact. moves all 4 extremities w/o difficulty. Affect pleasant but anxious   ASSESSMENT & PLAN:   1. Chronic biventricular CHF with cardiogenic shock: - ECHO 3/22  LVEF < 20% with large LV thrombus,mild to moderate RV dysfunction - Cardiac catheterization 04/16/20 with normal coronaries - cMRI 3/22 LVEF 12% RVEF 21%. Apical akinesis Elevated ECV in apex. No LGE in apex. RVEF 21%. - also complicated by RV failure from PE - Echo  06/09/20 EF 25-30% RV mild HK ? Small laminated apical clot  - Echo 12/10/20 EF 30-35% Apical AK with small apical thrombus RV ok - CMRI 12/2020 EF 33% RV 40%.  - Echo today 05/17/22 EF 40-45% RV ok  - NYHA I  - Volume status ok  - Continue spironolactone 12.5 - Increase entresto to 49/51 BID - Continue ivabradine to 5 bid - Continue  jardiance 10  - Continue carvedilol 6.25 bid - Recent labs ok  2. LV thrombus: - MRI clot resolved.  -  Continue Eliquis. No bleeding    3. 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 Also R femoral DVT - Chest CT 3/22 bilateral submassive PE with RV strain and RUL infarct - s/p Penumbra clot extraction 04/16/20 - Echo 11/2020 RV normal  - Will need lifelong Eliquis. Continue eliquis 2.5 bid based on Amplify-EXT once off AC for LV thrombus - No bleeding  4. HTN  - Blood pressure well controlled. CGDMT as above  5. Depression/anxiety - increase zoloft to 100 daily   Arvilla Meres, MD  2:10 PM

## 2022-05-17 NOTE — Progress Notes (Signed)
Echocardiogram 2D Echocardiogram has been performed.  Toni Amend 05/17/2022, 1:35 PM

## 2022-05-22 ENCOUNTER — Encounter: Payer: 59 | Admitting: Internal Medicine

## 2022-05-23 ENCOUNTER — Encounter (INDEPENDENT_AMBULATORY_CARE_PROVIDER_SITE_OTHER): Payer: Self-pay | Admitting: Family Medicine

## 2022-05-23 ENCOUNTER — Ambulatory Visit (INDEPENDENT_AMBULATORY_CARE_PROVIDER_SITE_OTHER): Payer: 59 | Admitting: Family Medicine

## 2022-05-23 VITALS — BP 135/82 | HR 71 | Temp 97.6°F | Ht 73.0 in | Wt 306.0 lb

## 2022-05-23 DIAGNOSIS — Z6841 Body Mass Index (BMI) 40.0 and over, adult: Secondary | ICD-10-CM

## 2022-05-23 DIAGNOSIS — R7303 Prediabetes: Secondary | ICD-10-CM | POA: Diagnosis not present

## 2022-05-23 DIAGNOSIS — I110 Hypertensive heart disease with heart failure: Secondary | ICD-10-CM | POA: Diagnosis not present

## 2022-05-23 DIAGNOSIS — I5022 Chronic systolic (congestive) heart failure: Secondary | ICD-10-CM

## 2022-05-23 DIAGNOSIS — F39 Unspecified mood [affective] disorder: Secondary | ICD-10-CM

## 2022-05-23 DIAGNOSIS — E559 Vitamin D deficiency, unspecified: Secondary | ICD-10-CM

## 2022-05-23 DIAGNOSIS — I10 Essential (primary) hypertension: Secondary | ICD-10-CM

## 2022-05-23 MED ORDER — VITAMIN D (ERGOCALCIFEROL) 1.25 MG (50000 UNIT) PO CAPS
50000.0000 [IU] | ORAL_CAPSULE | ORAL | 0 refills | Status: DC
Start: 2022-05-23 — End: 2022-06-14

## 2022-05-23 NOTE — Progress Notes (Signed)
Kevin Baxter, D.O.  ABFM, ABOM Specializing in Clinical Bariatric Medicine  Office located at: [address] [place], [place]  [address]     Assessment and Plan:   No orders of the defined types were placed in this encounter.   There are no discontinued medications.   Meds ordered this encounter  Medications   Vitamin D, Ergocalciferol, (DRISDOL) 1.25 MG (50000 UNIT) CAPS capsule    Sig: Take 1 capsule (50,000 Units total) by mouth every 7 (seven) days.    Dispense:  4 capsule    Refill:  0     Prediabetes Assessment: Condition is Not optimized. Labs were reviewed. Lab Results  Component Value Date   HGBA1C 6.0 (H) 05/09/2022   HGBA1C 5.6 08/17/2020   HGBA1C 6.3 (H) 04/11/2020   INSULIN 25.7 (H) 05/09/2022   Lab Results  Component Value Date   WBC 4.9 05/09/2022   HGB 18.2 (H) 05/09/2022   HCT 57.1 (H) 05/09/2022   MCV 82 05/09/2022   PLT 210 05/09/2022   Lab Results  Component Value Date   VITAMINB12 487 05/09/2022  Patients A1c levels have increased from 5.6 on 08/17/2020 to 6.0 on 05/09/2022. His insulin levels are almost 5 times above the normal value.  His B12 levels are almost at the recommended value of 500+.   Plan: This is a new diagnosis for Kevin Baxter  - Continue to decrease simple carbs/ sugars; increase fiber and proteins -> follow his meal plan.   - Explained role of simple carbs and insulin levels on hunger and cravings - Handouts provided at pt's request after education provided.  All concerns/questions addressed.   - Anticipatory guidance given.   Kevin Baxter will continue to work on weight loss, exercise, via their meal plan we devised to help decrease the risk of progressing to diabetes.  - We will recheck A1c and fasting insulin level in approximately 3 months from last check, or as deemed appropriate.    Chronic systolic congestive heart failure Assessment: Condition is stable. Lab Results  Component Value Date   WBC 4.9  05/09/2022   HGB 18.2 (H) 05/09/2022   HCT 57.1 (H) 05/09/2022   MCV 82 05/09/2022   PLT 210 05/09/2022  -His blood counts are slightly high, which may possibly be due to one of the heart medications he is on. Asymptomatic. No concerns.  -Patient endorses that this condition is "pretty well controlled", he denies any congestive heart failure symptoms today. Notes and medical record reviewed from cardiology and other specialists.  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  Pt was admitted in 3/22 for intractable n/v.   Initially felt to have acute cholecystitis.   Echo showed EF 15% with moderate RV dysfunction and a large LV blood clot.  Started on entresto, ivabradine and eliquis.   Post -cath developed worsening CP and tachycardia. 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  Underwent catheter-based thrombectomy of PE.   Discharged 04/24/20.  - Patient follows with Dr. Gala Romney of cardiology usually once yearly for his CHF and follows with Dr. Mayford Knife of cardiology more frequently for general cardiac and sleep apnea issues.  He is compliant and tolerant with Eliquis, Corlanor, and Entresto.  Denies any side effects.   Plan:   - I recommended Kevin Baxter to discuss with Kevin Baxter whether any of the heart medications he is on is possibly causing his elevated HGB  count.  - Continue meds as per specialist and continue treatment as per cardiology.    Essential hypertension Assessment: Condition is improving, but not optimized. Labs were reviewed.  Last 3 blood pressure readings in our office are as follows: BP Readings from Last 3 Encounters:  05/23/22 135/82  05/17/22 (!) 144/80  05/09/22 (!) 140/98   Lab Results  Component Value Date   CREATININE 1.17 05/09/2022   BUN 13 05/09/2022   NA 142 05/09/2022   K 4.7 05/09/2022   CL 101 05/09/2022   CO2 25 05/09/2022      Component Value Date/Time    PROT 7.4 05/09/2022 0951   ALBUMIN 4.2 05/09/2022 0951   AST 17 05/09/2022 0951   ALT 19 05/09/2022 0951   ALKPHOS 77 05/09/2022 0951   BILITOT 0.9 05/09/2022 0951   BILIDIR 0.9 (H) 04/24/2020 0037   IBILI 1.5 (H) 04/24/2020 0037  Today his blood pressure is stable. He has been compliant with Coreg and Aldactone. Denies any side effects. Asymptomatic. No concerns.   Plan: Continue with meds as prescribed by specialist.  - I informed patient that having high blood pressure and dehydration can likely cause kidney damage. - Unless pre-existing renal or cardiopulmonary conditions exist which patient was told to limit their fluid intake by another provider, I recommended roughly one half of their weight in pounds, to be the approximate ounces of non-caloric, non-caffeinated beverages they should drink per day; including more if they are engaging in exercise.  -Continue with Prudent nutritional plan and low sodium diet, advance exercise as tolerated.   - Ambulatory blood pressure monitoring encouraged.  Reminded patient that if they ever feel poorly in any way, to check their blood pressure and pulse as well. - We will continue to monitor closely alongside PCP/ specialists.  Pt reminded to also f/up with those individuals as instructed by them.  - We will continue to monitor symptoms as they relate to the his weight loss journey.    Mood disorder-Emotional Eating Assessment: Condition is stable. Labs were reviewed.  Lab Results  Component Value Date   TSH 2.400 05/09/2022   FREET4 1.04 05/09/2022  Labs indicate that his Thyroid function is normal. Mood is stable. No SI/HI. Patient endorses that he eats when stressed, bored, to comfort himself, and as a reward. He is compliant with Zoloft 50 mg daily for anxiety.   Plan: Continue with meds as prescribed. -Patient has an appointment with Kevin Baxter on 05/29/22.  -Continue his prudent nutritional plan that is low in simple carbohydrates, saturated  fats and trans fats to goal of 5-10% weight loss to achieve significant health benefits.  Pt encouraged to continually advance exercise and cardiovascular fitness as tolerated throughout weight loss journey.     Vitamin D Deficiency Assessment: Condition is new and not optimized. Labs were reviewed. Lab Results  Component Value Date   VD25OH 16.5 (L) 05/09/2022  Patient's Vitamin D levels are below the recommended range of 50-70. He is currently not on any supplement for this condition.  Plan: Begin Ergocalciferol 50K IU weekly  - I discussed the importance of vitamin D to the patient's health and well-being as well as to their ability to lose weight.  - I reviewed possible symptoms of low Vitamin D:  low energy, depressed mood, muscle aches, joint aches, osteoporosis etc. with patient - ideal vitamin D levels reviewed with patient   - weight loss will likely improve availability of vitamin D, thus encouraged Maverik to continue with  meal plan and their weight loss efforts to further improve this condition.  Thus, we will need to monitor levels regularly (every 3-4 mo on average) to keep levels within normal limits and prevent over supplementation. - pt's questions and concerns regarding this condition addressed.     TREATMENT PLAN FOR OBESITY: BMI 40.0-44.9, adult-current bmi 41.9 Morbid obesity-start bmi 41.9/date 05/09/22 Assessment: Condition is improving, but not optimized. Biometric data collected today, was reviewed with patient.  Fat mass has decreased by 6.8lb. Muscle mass has decreased by 3.6lb. Total body water has decreased by 6lb.  Lab Results  Component Value Date   CHOL 201 (H) 05/09/2022   HDL 59 05/09/2022   LDLCALC 119 (H) 05/09/2022   TRIG 132 05/09/2022   CHOLHDL 3 08/17/2020  His cholesterol and LDL levels are elevated. His HDL and TRIG values are stable.   Plan:  Deforrest is currently in the action stage of change. As such, his goal is to continue weight management  plan. Jartavious will work on healthier eating habits and continue the Category 3 plan with 10 oz of protein at dinner and 4-6 oz of protein at lunch.  -I recommended him to discuss with either his PCP or specialist about his elevated cholesterol levels and possibly starting cholesterol meds.  - I stressed the importance that patient continue with our prudent nutritional plan that is low in saturated and trans fats, and low in fatty carbs to improve these numbers.  -I suggested different meal ideas that patient can try to increase fiber and protein intake, such as chicken fajitas with [place] tortillas and lemon garlic rosemary chicken.   Behavioral Intervention Additional resources provided today: category 3 meal plan information and prediabetes and insulin resistance handout Evidence-based interventions for health behavior change were utilized today including the discussion of self monitoring techniques, problem-solving barriers and SMART goal setting techniques.   Regarding patient's less desirable eating habits and patterns, we employed the technique of small changes.  Pt will specifically work on: consuming 10 ounces of lean protein at dinner and 4-6 ounces at lunch for next visit.    Recommended Physical Activity Goals Srihaan has been advised to work up to 150 minutes of moderate intensity aerobic activity a week and strengthening exercises 2-3 times per week for cardiovascular health, weight loss maintenance and preservation of muscle mass.  He has agreed to Continue current level of physical activity   FOLLOW UP: Return in about 3 weeks (around 06/13/2022). He was informed of the importance of frequent follow up visits to maximize his success with intensive lifestyle modifications for his multiple health conditions.  Subjective:   Chief complaint: Obesity Kevin Baxter is here to discuss his progress with his obesity treatment plan. He is on the the Category 3 Plan and states he is following  his eating plan approximately 90 % of the time. He states he is not exercising.   Interval History:  Kevin Baxter is here today for his first follow-up office visit since starting the program with [place].  Since last office visit he endorses that he was able to eat all the food and protein on the plan. He measured his protein intake and at night would typically eat 8 ounces of chicken and 4-6 ounces of protein at lunch. Patient reports that his sweet cravings were strong at times. When he had his sweet cravings, he ate a Fit Crunch bar (190 calories, 16 grams of protein) , a [place] bar, or strawberries.  All blood work/ lab tests that were recently ordered by myself or an outside provider were reviewed with patient today per their request. Extended time was spent counseling him on all new disease processes that were discovered or preexisting ones that are affected by BMI.  he understands that many of these abnormalities will need to monitored regularly along with the current treatment plan of prudent dietary changes, in which we are making each and every office visit, to improve these health parameters.  Review of Systems:  Pertinent positives were addressed with patient today.  Weight Summary and Biometrics   No data recorded No data recorded  Vitals Temp: 97.6 F (36.4 C) BP: 135/82 Pulse Rate: 71 SpO2: 97 %   Anthropometric Measurements Height: 6\' 1"  (1.854 m) Weight: (!) 306 lb (138.8 kg) BMI (Calculated): 40.38 Starting Weight: 317 lb   Body Composition  Body Fat %: 38.3 % Fat Mass (lbs): 117.6 lbs Muscle Mass (lbs): 180 lbs Total Body Water (lbs): 124.8 lbs Visceral Fat Rating : 23   Other Clinical Data Fasting: no Labs: no Today's Visit #: 2 Starting Date: 05/09/22     Objective:   PHYSICAL EXAM:  Blood pressure 135/82, pulse 71, temperature 97.6 F (36.4 C), height 6\' 1"  (1.854 m), weight (!) 306 lb (138.8 kg), SpO2 97 %. Body mass index is 40.37  kg/m.  General: Well Developed, well nourished, and in no acute distress.  HEENT: Normocephalic, atraumatic Skin: Warm and dry, cap RF less 2 sec, good turgor Chest:  Normal excursion, shape, no gross abn Respiratory: speaking in full sentences, no conversational dyspnea NeuroM-Sk: Ambulates w/o assistance, moves * 4 Psych: A and O *3, insight good, mood-full  DIAGNOSTIC DATA REVIEWED:  BMET    Component Value Date/Time   NA 142 05/09/2022 0951   K 4.7 05/09/2022 0951   CL 101 05/09/2022 0951   CO2 25 05/09/2022 0951   GLUCOSE 83 05/09/2022 0951   GLUCOSE 101 (H) 02/10/2022 1208   GLUCOSE 90 11/22/2005 1016   BUN 13 05/09/2022 0951   CREATININE 1.17 05/09/2022 0951   CALCIUM 9.7 05/09/2022 0951   GFRNONAA >60 02/10/2022 1208   GFRAA 108 10/03/2007 1056   Lab Results  Component Value Date   HGBA1C 6.0 (H) 05/09/2022   HGBA1C 6.2 01/17/2013   Lab Results  Component Value Date   INSULIN 25.7 (H) 05/09/2022   Lab Results  Component Value Date   TSH 2.400 05/09/2022   CBC    Component Value Date/Time   WBC 4.9 05/09/2022 0951   WBC 5.7 02/10/2022 1208   RBC 6.99 (H) 05/09/2022 0951   RBC 7.55 (H) 02/10/2022 1208   HGB 18.2 (H) 05/09/2022 0951   HCT 57.1 (H) 05/09/2022 0951   PLT 210 05/09/2022 0951   MCV 82 05/09/2022 0951   MCH 26.0 (L) 05/09/2022 0951   MCH 25.7 (L) 02/10/2022 1208   MCHC 31.9 05/09/2022 0951   MCHC 32.0 02/10/2022 1208   RDW 17.8 (H) 05/09/2022 0951   Iron Studies    Component Value Date/Time   IRON 27 (L) 04/20/2020 1048   TIBC 231 (L) 04/20/2020 1048   FERRITIN 853 (H) 04/20/2020 1048   IRONPCTSAT 12 (L) 04/20/2020 1048   Lipid Panel     Component Value Date/Time   CHOL 201 (H) 05/09/2022 0951   TRIG 132 05/09/2022 0951   TRIG 69 11/22/2005 1016   HDL 59 05/09/2022 0951   CHOLHDL 3 08/17/2020 1055   VLDL 24.6 08/17/2020  1055   LDLCALC 119 (H) 05/09/2022 0951   Hepatic Function Panel     Component Value Date/Time   PROT  7.4 05/09/2022 0951   ALBUMIN 4.2 05/09/2022 0951   AST 17 05/09/2022 0951   ALT 19 05/09/2022 0951   ALKPHOS 77 05/09/2022 0951   BILITOT 0.9 05/09/2022 0951   BILIDIR 0.9 (H) 04/24/2020 0037   IBILI 1.5 (H) 04/24/2020 0037      Component Value Date/Time   TSH 2.400 05/09/2022 0951   Nutritional Lab Results  Component Value Date   VD25OH 16.5 (L) 05/09/2022    Attestations:   Reviewed by clinician on day of visit: allergies, medications, problem list, medical history, surgical history, family history, social history, and previous encounter notes.   Patient was in the office today and time spent on visit including pre-visit chart review and post-visit care/coordination of care and electronic medical record documentation was  60 minutes. 50% of the time was in face to face counseling of this patient's medical condition(s) and providing education on treatment options to include the first-line treatment of diet and lifestyle modification.   I,Kevin Baxter,acting as a Neurosurgeon for [employer], DO.,have documented all relevant documentation on the behalf of Thomasene Lot, DO,as directed by  Thomasene Lot, DO while in the presence of Thomasene Lot, DO.   I, Thomasene Lot, DO, have reviewed all documentation for this visit. The documentation on 05/23/22 for the exam, diagnosis, procedures, and orders are all accurate and complete.

## 2022-05-29 ENCOUNTER — Telehealth (INDEPENDENT_AMBULATORY_CARE_PROVIDER_SITE_OTHER): Payer: 59 | Admitting: Psychology

## 2022-05-29 DIAGNOSIS — F5089 Other specified eating disorder: Secondary | ICD-10-CM | POA: Diagnosis not present

## 2022-05-29 DIAGNOSIS — F4320 Adjustment disorder, unspecified: Secondary | ICD-10-CM | POA: Diagnosis not present

## 2022-06-06 ENCOUNTER — Other Ambulatory Visit (INDEPENDENT_AMBULATORY_CARE_PROVIDER_SITE_OTHER): Payer: Self-pay | Admitting: Family Medicine

## 2022-06-06 DIAGNOSIS — E559 Vitamin D deficiency, unspecified: Secondary | ICD-10-CM

## 2022-06-12 ENCOUNTER — Telehealth (INDEPENDENT_AMBULATORY_CARE_PROVIDER_SITE_OTHER): Payer: Self-pay | Admitting: Psychology

## 2022-06-12 ENCOUNTER — Telehealth (INDEPENDENT_AMBULATORY_CARE_PROVIDER_SITE_OTHER): Payer: 59 | Admitting: Psychology

## 2022-06-12 ENCOUNTER — Encounter (INDEPENDENT_AMBULATORY_CARE_PROVIDER_SITE_OTHER): Payer: Self-pay

## 2022-06-12 NOTE — Telephone Encounter (Signed)
  Office: [phone]  /  Fax: [phone]  Date of Call: Jun 12, 2022  Time of Call: 8:34am; 8:41am Provider: Lawerance Cruel, PsyD  CONTENT: Kevin Baxter was noted as arrived for his 8:30am MyChart Video Visit; however, he was not signed on. As such, this provider called him at 8:34am and left a HIPAA compliant voicemail requesting a call back. This provider inquired with front desk staff if Kevin Baxter was present at the clinic and therefore arrived. [name] (front desk staff) stated he was there. This provider requested [name] be informed that he has a virtual appointment with this provider and the appointment could still take place if he could sign on. Per [name], [name] indicated he was not aware that it was a virtual appointment and reportedly left because he had to get back to work. This provider attempted to reach [name] again at 8:41am. A HIPAA compliant voicemail was left again requesting a call back to remedy the situation.   PLAN: This provider will wait for [name] to call back. No further follow-up planned by this provider.

## 2022-06-12 NOTE — Progress Notes (Unsigned)
  Office: [phone]  /  Fax: [phone]    Date: Jun 12, 2022    Appointment Start Time: *** Duration: *** minutes Provider: Lawerance Cruel, Psy.D. Type of Session: Individual Therapy  Location of Patient: {gbptloc:23249} (private location) Location of Provider: Provider's Home (private office) Type of Contact: Telepsychological Visit via MyChart Video Visit  Session Content:This provider called Konrad Dolores at 8:34am as he did not present for today's appointment. A HIPAA compliant voicemail was left requesting a call back. Front desk informed this provider he is at the clinic.  As such, today's appointment was initiated *** minutes late. Kevin Baxter is a 52 y.o. male presenting for a follow-up appointment to address the previously established treatment goal of increasing coping skills.Today's appointment was a telepsychological visit. Konrad Dolores provided verbal consent for today's telepsychological appointment and he is aware he is responsible for securing confidentiality on his end of the session. Prior to proceeding with today's appointment, Avyay's physical location at the time of this appointment was obtained as well a phone number he could be reached at in the event of technical difficulties. Andrewjoseph and this provider participated in today's telepsychological service.   This provider conducted a brief check-in. *** Jakyrin was receptive to today's appointment as evidenced by openness to sharing, responsiveness to feedback, and {gbreceptiveness:23401}.  Mental Status Examination:  Appearance: {Appearance:22431} Behavior: {Behavior:22445} Mood: {gbmood:21757} Affect: {Affect:22436} Speech: {Speech:22432} Eye Contact: {Eye Contact:22433} Psychomotor Activity: {Motor Activity:22434} Gait: {gbgait:23404} Thought Process: {thought process:22448}  Thought Content/Perception: {disturbances:22451} Orientation: {Orientation:22437} Memory/Concentration: {gbcognition:22449} Insight:  {Insight:22446} Judgment: {Insight:22446}  Interventions:  {Interventions for Progress Notes:23405}  DSM-5 Diagnosis(es): {Diagnoses:22752}  Treatment Goal & Progress: During the initial appointment with this provider, the following treatment goal was established: increase coping skills. Demaryius has demonstrated progress in his goal as evidenced by {gbtxprogress:22839}. Latonya also {gbtxprogress2:22951}.  Plan: The next appointment is scheduled for *** at ***, which will be via MyChart Video Visit. The next session will focus on {Plan for Next Appointment:23400}.

## 2022-06-14 ENCOUNTER — Encounter (INDEPENDENT_AMBULATORY_CARE_PROVIDER_SITE_OTHER): Payer: Self-pay | Admitting: Family Medicine

## 2022-06-14 ENCOUNTER — Ambulatory Visit (INDEPENDENT_AMBULATORY_CARE_PROVIDER_SITE_OTHER): Payer: 59 | Admitting: Family Medicine

## 2022-06-14 VITALS — BP 117/79 | HR 73 | Temp 98.1°F | Ht 73.0 in | Wt 304.0 lb

## 2022-06-14 DIAGNOSIS — E559 Vitamin D deficiency, unspecified: Secondary | ICD-10-CM | POA: Diagnosis not present

## 2022-06-14 DIAGNOSIS — Z6841 Body Mass Index (BMI) 40.0 and over, adult: Secondary | ICD-10-CM

## 2022-06-14 DIAGNOSIS — I10 Essential (primary) hypertension: Secondary | ICD-10-CM | POA: Diagnosis not present

## 2022-06-14 DIAGNOSIS — R7303 Prediabetes: Secondary | ICD-10-CM

## 2022-06-14 MED ORDER — VITAMIN D (ERGOCALCIFEROL) 1.25 MG (50000 UNIT) PO CAPS
50000.0000 [IU] | ORAL_CAPSULE | ORAL | 0 refills | Status: DC
Start: 2022-06-14 — End: 2022-07-12

## 2022-06-14 NOTE — Progress Notes (Signed)
Carlye Grippe, D.O.  ABFM, ABOM Specializing in Clinical Bariatric Medicine  Office located at: [address] [place], [place]  [address]     Assessment and Plan:   No orders of the defined types were placed in this encounter.   Medications Discontinued During This Encounter  Medication Reason   Vitamin D, Ergocalciferol, (DRISDOL) 1.25 MG (50000 UNIT) CAPS capsule Reorder     Meds ordered this encounter  Medications   Vitamin D, Ergocalciferol, (DRISDOL) 1.25 MG (50000 UNIT) CAPS capsule    Sig: Take 1 capsule (50,000 Units total) by mouth every 7 (seven) days.    Dispense:  4 capsule    Refill:  0     Essential hypertension Assessment: Condition is stable.  Last 3 blood pressure readings in our office are as follows: BP Readings from Last 3 Encounters:  06/14/22 117/79  05/23/22 135/82  05/17/22 (!) 144/80  - Today his blood pressure is stable. Asymptomatic, no concerns.  - Good compliance and tolerance with Coreg and Aldactone. Denies any side effects.   Plan: - Continue with all antihypertensive medications as recommended by specialist.  - Continue with Prudent nutritional plan and low sodium diet, advance exercise as tolerated.     Prediabetes Assessment: Condition is not optimized.  Lab Results  Component Value Date   HGBA1C 6.0 (H) 05/09/2022   HGBA1C 5.6 08/17/2020   HGBA1C 6.3 (H) 04/11/2020   INSULIN 25.7 (H) 05/09/2022  - Pt is currently not on any medication for this condition. This is diet controlled.  - His hunger and cravings are mostly controlled.   Plan:    - Reinier will continue to work on weight loss, exercise, via their meal plan we devised to help decrease the risk of progressing to diabetes.    Vitamin D deficiency Assessment: Condition is not optimized. Lab Results  Component Value Date   VD25OH 16.5 (L) 05/09/2022  - Pt endorses that he finished the Vitamin D prescription in 4 days. He misunderstood that this  prescription is to be taken only once a week.  Asymptomatic, no concerns.   Plan: - Continue with Ergocalciferol 50K IU once weekly. Will refill this today.   - Will continue to monitor levels regularly (every 3-4 mo on average) to keep levels within normal limits and prevent over supplementation.   TREATMENT PLAN FOR OBESITY: BMI 40.0-44.9, adult-current bmi 40.12 Morbid obesity-start bmi 41.9/date 05/09/22 Assessment:  ZELMA FIENE III is here to discuss his progress with his obesity treatment plan along with follow-up of his obesity related diagnoses. See Medical Weight Management Flowsheet for complete bioelectrical impedance results.  Condition is improving, but not optimized.  Biometric data collected today, was reviewed with patient.   Since last office visit on 05/23/22 patient's Fat mass has decreased by 2.6 lb. Muscle mass has decreased by 0.2 lb. Total body water has increased by 2.4 lb.  Counseling done on how various foods will affect these numbers and how to maximize success  Total lbs lost to date: 13 Total weight loss percentage to date: 4.10   Plan:  Raekwan is currently in the action stage of change. As such, his goal is to continue his weight management plan. Momen will work on healthier eating habits and continue with Category 3 meal plan with additional breakfast and lunch options and with 4-6 ounces of protein at lunch and 10 ounces of protein at dinner. - Showed the patient the Strawberry Protein One Bars, Fiber One donuts/brownies,  and [employer] Protein Chips.   Behavioral Intervention Additional resources provided today: breakfast options, lunch options, and 100 and 200 calorie snack ideas handout.  Evidence-based interventions for health behavior change were utilized today including the discussion of self monitoring techniques, problem-solving barriers and SMART goal setting techniques.   Regarding patient's less desirable eating habits and patterns, we employed the  technique of small changes.  Pt will specifically work on: continue adherence to prudent nutritional plan for next visit.    Recommended Physical Activity Goals  Izrael has been advised to slowly work up to 150 minutes of moderate intensity aerobic activity a week and strengthening exercises 2-3 times per week for cardiovascular health, weight loss maintenance and preservation of muscle mass.   He has agreed to Continue current level of physical activity   FOLLOW UP: Return in about 2 weeks (around 06/28/2022). He was informed of the importance of frequent follow up visits to maximize his success with intensive lifestyle modifications for his multiple health conditions.  Subjective:   Chief complaint: Obesity Ladarrion is here to discuss his progress with his obesity treatment plan. He is on the the Category 3 Plan with 10 ounces of protein at dinner and 4-6 ounces of protein at lunch and states he is following his eating plan approximately 50% of the time. He states he is not exercising.   Interval History:  WHITAKER ISER III is here for a follow up office visit.    Since last office visit:   - Endorses eating higher calorie foods/ liquids on a few occasions/gatherings (I.e Mother's day)  - Reports that his 1st meeting with Dr.Barker went well.  - Endorses that the meal plan "has been boring".   We reviewed his meal plan and all questions were answered. Patient's food recall appears to be accurate and consistent with what is on plan when he is following it. When eating on plan, his hunger and cravings are well controlled.     Review of Systems:  Pertinent positives were addressed with patient today.  Weight Summary and Biometrics   Weight Lost Since Last Visit: 2 lb  Weight Gained Since Last Visit: 0   Vitals Temp: 98.1 F (36.7 C) BP: 117/79 Pulse Rate: 73 SpO2: 95 %   Anthropometric Measurements Height: 6\' 1"  (1.854 m) Weight: (!) 304 lb (137.9 kg) BMI (Calculated):  40.12 Weight at Last Visit: 306 lb Weight Lost Since Last Visit: 2 lb Weight Gained Since Last Visit: 0 Starting Weight: 317 lb Total Weight Loss (lbs): 13 lb (5.897 kg) Peak Weight: 333 lb   Body Composition  Body Fat %: 37.8 % Fat Mass (lbs): 115 lbs Muscle Mass (lbs): 179.8 lbs Total Body Water (lbs): 127.2 lbs Visceral Fat Rating : 23   Other Clinical Data Fasting: No Labs: No Today's Visit #: 3 Starting Date: 05/09/22   Objective:   PHYSICAL EXAM: Blood pressure 117/79, pulse 73, temperature 98.1 F (36.7 C), height 6\' 1"  (1.854 m), weight (!) 304 lb (137.9 kg), SpO2 95 %. Body mass index is 40.11 kg/m.  General: Well Developed, well nourished, and in no acute distress.  HEENT: Normocephalic, atraumatic Skin: Warm and dry, cap RF less 2 sec, good turgor Chest:  Normal excursion, shape, no gross abn Respiratory: speaking in full sentences, no conversational dyspnea NeuroM-Sk: Ambulates w/o assistance, moves * 4 Psych: A and O *3, insight good, mood-full  DIAGNOSTIC DATA REVIEWED:  BMET    Component Value Date/Time   NA 142 05/09/2022 0951  K 4.7 05/09/2022 0951   CL 101 05/09/2022 0951   CO2 25 05/09/2022 0951   GLUCOSE 83 05/09/2022 0951   GLUCOSE 101 (H) 02/10/2022 1208   GLUCOSE 90 11/22/2005 1016   BUN 13 05/09/2022 0951   CREATININE 1.17 05/09/2022 0951   CALCIUM 9.7 05/09/2022 0951   GFRNONAA >60 02/10/2022 1208   GFRAA 108 10/03/2007 1056   Lab Results  Component Value Date   HGBA1C 6.0 (H) 05/09/2022   HGBA1C 6.2 01/17/2013   Lab Results  Component Value Date   INSULIN 25.7 (H) 05/09/2022   Lab Results  Component Value Date   TSH 2.400 05/09/2022   CBC    Component Value Date/Time   WBC 4.9 05/09/2022 0951   WBC 5.7 02/10/2022 1208   RBC 6.99 (H) 05/09/2022 0951   RBC 7.55 (H) 02/10/2022 1208   HGB 18.2 (H) 05/09/2022 0951   HCT 57.1 (H) 05/09/2022 0951   PLT 210 05/09/2022 0951   MCV 82 05/09/2022 0951   MCH 26.0 (L)  05/09/2022 0951   MCH 25.7 (L) 02/10/2022 1208   MCHC 31.9 05/09/2022 0951   MCHC 32.0 02/10/2022 1208   RDW 17.8 (H) 05/09/2022 0951   Iron Studies    Component Value Date/Time   IRON 27 (L) 04/20/2020 1048   TIBC 231 (L) 04/20/2020 1048   FERRITIN 853 (H) 04/20/2020 1048   IRONPCTSAT 12 (L) 04/20/2020 1048   Lipid Panel     Component Value Date/Time   CHOL 201 (H) 05/09/2022 0951   TRIG 132 05/09/2022 0951   TRIG 69 11/22/2005 1016   HDL 59 05/09/2022 0951   CHOLHDL 3 08/17/2020 1055   VLDL 24.6 08/17/2020 1055   LDLCALC 119 (H) 05/09/2022 0951   Hepatic Function Panel     Component Value Date/Time   PROT 7.4 05/09/2022 0951   ALBUMIN 4.2 05/09/2022 0951   AST 17 05/09/2022 0951   ALT 19 05/09/2022 0951   ALKPHOS 77 05/09/2022 0951   BILITOT 0.9 05/09/2022 0951   BILIDIR 0.9 (H) 04/24/2020 0037   IBILI 1.5 (H) 04/24/2020 0037      Component Value Date/Time   TSH 2.400 05/09/2022 0951   Nutritional Lab Results  Component Value Date   VD25OH 16.5 (L) 05/09/2022    Attestations:   Reviewed by clinician on day of visit: allergies, medications, problem list, medical history, surgical history, family history, social history, and previous encounter notes.  I,Special Puri,acting as a Neurosurgeon for [employer], DO.,have documented all relevant documentation on the behalf of Thomasene Lot, DO,as directed by  Thomasene Lot, DO while in the presence of Thomasene Lot, DO.   I, Thomasene Lot, DO, have reviewed all documentation for this visit. The documentation on 06/15/22 for the exam, diagnosis, procedures, and orders are all accurate and complete.

## 2022-06-15 ENCOUNTER — Other Ambulatory Visit: Payer: Self-pay | Admitting: Internal Medicine

## 2022-06-23 ENCOUNTER — Encounter: Payer: Self-pay | Admitting: Internal Medicine

## 2022-06-29 ENCOUNTER — Ambulatory Visit (INDEPENDENT_AMBULATORY_CARE_PROVIDER_SITE_OTHER): Payer: 59 | Admitting: Family Medicine

## 2022-06-29 NOTE — Progress Notes (Incomplete)
Kevin Baxter, D.O.  ABFM, ABOM Specializing in Clinical Bariatric Medicine  Office located at: [address] [place], [place]  [address]     Assessment and Plan:   No orders of the defined types were placed in this encounter.   There are no discontinued medications.   No orders of the defined types were placed in this encounter.    *** There are no diagnoses linked to this encounter.     TREATMENT PLAN FOR OBESITY:   Assessment:  Kevin Baxter is here to discuss his progress with his obesity treatment plan along with follow-up of his obesity related diagnoses. See Medical Weight Management Flowsheet for complete bioelectrical impedance results.  Condition is {docourse:29403:::1}. {BiometricData (Optional):29179}  Since last office visit on *** patient's  Muscle mass has {DID:29233} by ***lb. Fat mass has {DID:29233} by ***lb. Total body water has {DID:29233} by ***lb.  Counseling done on how various foods will affect these numbers and how to maximize success  Total lbs lost to date: *** Total weight loss percentage to date: *** ( use https://www.[employer].si )  Plan:  Kimsey is currently in the action stage of change. As such, his goal is to continue his weight management plan. Wolfe will work on healthier eating habits and try to follow the {mealplan:29239} best they can.   Behavioral Intervention Additional resources provided today: {weightresources:29185} Evidence-based interventions for health behavior change were utilized today including the discussion of self monitoring techniques, problem-solving barriers and SMART goal setting techniques.   Regarding patient's less desirable eating habits and patterns, we employed the technique of small changes.  Pt will specifically work on: *** for next visit.    Recommended Physical Activity Goals  Zi has been advised to slowly work up to 150 minutes of moderate  intensity aerobic activity a week and strengthening exercises 2-3 times per week for cardiovascular health, weight loss maintenance and preservation of muscle mass.   He has agreed to {EMEXERCISE:28847::"Think about ways to increase daily physical activity and overcoming barriers to exercise"}  Pharmacotherapy Current Anti-obesity medications: ***. Reported side effects: ***. We discussed various medication options to help Maahir with his weight loss efforts and we both agreed to ***.( or Patient prefers to not start any weight loss medications at this time.   FOLLOW UP: No follow-ups on file.*** He was informed of the importance of frequent follow up visits to maximize his success with intensive lifestyle modifications for his multiple health conditions.  Subjective:   Chief complaint: Obesity Gunnar is here to discuss his progress with his obesity treatment plan. He is on the {MWMwtlossportion/plan2:23431} and states he is following his eating plan approximately ***% of the time. He states he is exercising *** minutes *** days per week.  Interval History:  Kevin Baxter is here for a follow up office visit.     Since last office visit:  ***  We reviewed his meal plan and all questions were answered. Patient's food recall appears to be accurate and consistent with what is on plan when he is following it. When eating on plan, his hunger and cravings are well controlled.      Pharmacotherapy for weight loss: He {srtis (Optional):29129} currently taking {srtpreviousweightlossmeds (Optional):29124} for medical weight loss.  Denies side effects.    Review of Systems:  Pertinent positives were addressed with patient today.  Weight Summary and Biometrics   No data recorded No data recorded ***  No data recorded No data  recorded No data recorded No data recorded   Objective:   PHYSICAL EXAM: There were no vitals taken for this visit. There is no height or weight on file to  calculate BMI.  General: Well Developed, well nourished, and in no acute distress.  HEENT: Normocephalic, atraumatic Skin: Warm and dry, cap RF less 2 sec, good turgor Chest:  Normal excursion, shape, no gross abn Respiratory: speaking in full sentences, no conversational dyspnea NeuroM-Sk: Ambulates w/o assistance, moves * 4 Psych: A and O *3, insight good, mood-full  DIAGNOSTIC DATA REVIEWED:  BMET    Component Value Date/Time   NA 142 05/09/2022 0951   K 4.7 05/09/2022 0951   CL 101 05/09/2022 0951   CO2 25 05/09/2022 0951   GLUCOSE 83 05/09/2022 0951   GLUCOSE 101 (H) 02/10/2022 1208   GLUCOSE 90 11/22/2005 1016   BUN 13 05/09/2022 0951   CREATININE 1.17 05/09/2022 0951   CALCIUM 9.7 05/09/2022 0951   GFRNONAA >60 02/10/2022 1208   GFRAA 108 10/03/2007 1056   Lab Results  Component Value Date   HGBA1C 6.0 (H) 05/09/2022   HGBA1C 6.2 01/17/2013   Lab Results  Component Value Date   INSULIN 25.7 (H) 05/09/2022   Lab Results  Component Value Date   TSH 2.400 05/09/2022   CBC    Component Value Date/Time   WBC 4.9 05/09/2022 0951   WBC 5.7 02/10/2022 1208   RBC 6.99 (H) 05/09/2022 0951   RBC 7.55 (H) 02/10/2022 1208   HGB 18.2 (H) 05/09/2022 0951   HCT 57.1 (H) 05/09/2022 0951   PLT 210 05/09/2022 0951   MCV 82 05/09/2022 0951   MCH 26.0 (L) 05/09/2022 0951   MCH 25.7 (L) 02/10/2022 1208   MCHC 31.9 05/09/2022 0951   MCHC 32.0 02/10/2022 1208   RDW 17.8 (H) 05/09/2022 0951   Iron Studies    Component Value Date/Time   IRON 27 (L) 04/20/2020 1048   TIBC 231 (L) 04/20/2020 1048   FERRITIN 853 (H) 04/20/2020 1048   IRONPCTSAT 12 (L) 04/20/2020 1048   Lipid Panel     Component Value Date/Time   CHOL 201 (H) 05/09/2022 0951   TRIG 132 05/09/2022 0951   TRIG 69 11/22/2005 1016   HDL 59 05/09/2022 0951   CHOLHDL 3 08/17/2020 1055   VLDL 24.6 08/17/2020 1055   LDLCALC 119 (H) 05/09/2022 0951   Hepatic Function Panel     Component Value Date/Time    PROT 7.4 05/09/2022 0951   ALBUMIN 4.2 05/09/2022 0951   AST 17 05/09/2022 0951   ALT 19 05/09/2022 0951   ALKPHOS 77 05/09/2022 0951   BILITOT 0.9 05/09/2022 0951   BILIDIR 0.9 (H) 04/24/2020 0037   IBILI 1.5 (H) 04/24/2020 0037      Component Value Date/Time   TSH 2.400 05/09/2022 0951   Nutritional Lab Results  Component Value Date   VD25OH 16.5 (L) 05/09/2022    Attestations:   Reviewed by clinician on day of visit: allergies, medications, problem list, medical history, surgical history, family history, social history, and previous encounter notes.   Patient was in the office today and time spent on visit including pre-visit chart review and post-visit care/coordination of care and electronic medical record documentation was *** minutes. 50% of the time was in face to face counseling of this patient's medical condition(s) and providing education on treatment options to include the first-line treatment of diet and lifestyle modification.   I,Special Puri,acting as a Neurosurgeon for [employer]  [employer]alski, DO.,have documented all relevant documentation on the behalf of Thomasene Lot, DO,as directed by  Thomasene Lot, DO while in the presence of Thomasene Lot, DO.   I, Thomasene Lot, DO, have reviewed all documentation for this visit. The documentation on 06/29/22 for the exam, diagnosis, procedures, and orders are all accurate and complete.

## 2022-07-04 ENCOUNTER — Ambulatory Visit (INDEPENDENT_AMBULATORY_CARE_PROVIDER_SITE_OTHER): Payer: 59 | Admitting: Internal Medicine

## 2022-07-04 ENCOUNTER — Encounter: Payer: Self-pay | Admitting: Internal Medicine

## 2022-07-04 VITALS — BP 122/72 | HR 78 | Temp 98.2°F | Resp 18 | Ht 73.0 in | Wt 312.0 lb

## 2022-07-04 DIAGNOSIS — Z1211 Encounter for screening for malignant neoplasm of colon: Secondary | ICD-10-CM

## 2022-07-04 DIAGNOSIS — I10 Essential (primary) hypertension: Secondary | ICD-10-CM | POA: Diagnosis not present

## 2022-07-04 DIAGNOSIS — Z0000 Encounter for general adult medical examination without abnormal findings: Secondary | ICD-10-CM | POA: Diagnosis not present

## 2022-07-04 DIAGNOSIS — R739 Hyperglycemia, unspecified: Secondary | ICD-10-CM | POA: Diagnosis not present

## 2022-07-04 NOTE — Patient Instructions (Addendum)
Please call the gastroenterologist regards your colonoscopy.  Vaccines I recommend: Shingrix (shingles) COVID-vaccine Pneumonia shot Flu shot every fall  Flonase over-the-counter 2 sprays on each side of the nose daily. Allegra 60 mg OTC: 1 tablet twice daily as needed Call if not gradually better     GO TO THE LAB : Get the blood work     GO TO THE FRONT DESK, PLEASE SCHEDULE YOUR APPOINTMENTS Come back for a physical exam in 1 year.

## 2022-07-04 NOTE — Progress Notes (Unsigned)
Subjective:    Patient ID: Kevin Baxter, male    DOB: 10-26-1970, 52 y.o.   MRN: [id number]  DOS:  07/04/2022 Type of visit - description: cpx  Here for CPX, in general feels well. He is anticoagulated, denies any blood in the stools or in the urine. Emotionally doing okay Denies chest pain or difficulty breathing Did report sore throat for the last couple of weeks, described as itching, he actually scratches his palate with his tongue, + sneezing.   Review of Systems  Other than above, a 14 point review of systems is negative     Past Medical History:  Diagnosis Date   Allergy    Anxiety    Arthritis    Back pain    CHF (congestive heart failure) (HCC)    Constipation    Depression    ED (erectile dysfunction)    Fundic gland polyposis of stomach    GERD with esophagitis    Gout    History of blood clots    Hx of adenomatous colonic polyps    Hypertension    IBS (irritable bowel syndrome)    Joint pain    Lactose intolerance    OSA on CPAP    Palpitations    Sleep apnea     Past Surgical History:  Procedure Laterality Date   COLONOSCOPY  05/2019   ESOPHAGOGASTRODUODENOSCOPY  05/2019   HAND SURGERY Right    2006 for a FX   IR ANGIOGRAM PULMONARY BILATERAL SELECTIVE  04/16/2020   IR ANGIOGRAM SELECTIVE EACH ADDITIONAL VESSEL  04/16/2020   IR ANGIOGRAM SELECTIVE EACH ADDITIONAL VESSEL  04/16/2020   IR THROMBECT PRIM MECH INIT (INCLU) MOD SED  04/16/2020   IR US GUIDE VASC ACCESS LEFT  04/16/2020   IR US GUIDE VASC ACCESS LEFT  04/16/2020   IR US GUIDE VASC ACCESS RIGHT  04/16/2020   RADIOLOGY WITH ANESTHESIA N/A 04/16/2020   Procedure: IR WITH ANESTHESIA- PULMONARY EMBOLUS INTERVENTION;  Surgeon: Bennie Dallas, MD;  Location: [facility] OR;  Service: Radiology;  Laterality: N/A;   RIGHT/LEFT HEART CATH AND CORONARY ANGIOGRAPHY N/A 04/15/2020   Procedure: RIGHT/LEFT HEART CATH AND CORONARY ANGIOGRAPHY;  Surgeon: Dolores Patty, MD;  Location: [facility] INVASIVE CV LAB;   Service: Cardiovascular;  Laterality: N/A;   Social History   Socioeconomic History   Marital status: Married    Spouse name: Not on file   Number of children: 2   Years of education: Not on file   Highest education level: Some college, no degree  Occupational History   Occupation: works for [employer]  Tobacco Use   Smoking status: Never   Smokeless tobacco: Never  Vaping Use   Vaping Use: Never used  Substance and Sexual Activity   Alcohol use: Not Currently    Comment: very rare    Drug use: No   Sexual activity: Not on file  Other Topics Concern   Not on file  Social History Narrative   Household: pt, wife daughter (2013),  boy  (2006),    Social Determinants of Corporate investment banker Strain: Not on file  Food Insecurity: Not on file  Transportation Needs: Not on file  Physical Activity: Not on file  Stress: Not on file  Social Connections: Not on file  Intimate Partner Violence: Not on file    Current Outpatient Medications  Medication Instructions   allopurinol (ZYLOPRIM) 300 mg, Oral, Daily   apixaban (ELIQUIS) 5 MG TABS tablet TAKE  1 TABLET BY MOUTH TWICE A DAY   carvedilol (COREG) 6.25 mg, Oral, 2 times daily with meals   ivabradine (CORLANOR) 5 mg, Oral, 2 times daily with meals   Jardiance 10 mg, Oral, Daily   sacubitril-valsartan (ENTRESTO) 49-51 MG 1 tablet, Oral, 2 times daily   sertraline (ZOLOFT) 100 mg, Oral, Daily   sildenafil (VIAGRA) 50 mg, Oral, Daily PRN   sodium chloride (OCEAN) 0.65 % SOLN nasal spray 2 sprays, Each Nare, 2 times daily   spironolactone (ALDACTONE) 25 MG tablet TAKE 1/2 TABLET BY MOUTH EVERY DAY   Vitamin D (Ergocalciferol) (DRISDOL) 50,000 Units, Oral, Every 7 days       Objective:   Physical Exam BP 122/72   Pulse 78   Temp 98.2 F (36.8 C) (Oral)   Resp 18   Ht 6\' 1"  (1.854 m)   Wt (!) 312 lb (141.5 kg)   SpO2 98%   BMI 41.16 kg/m  General: Well developed, NAD, BMI noted Neck: No  thyromegaly  HEENT:   Normocephalic . Face symmetric, atraumatic. Throat: Symmetric, normal rate. Lungs:  CTA B Normal respiratory effort, no intercostal retractions, no accessory muscle use. Heart: RRR,  no murmur.  Abdomen:  Not distended, soft, non-tender. No rebound or rigidity.   Lower extremities: no pretibial edema bilaterally  Skin: Exposed areas without rash. Not pale. Not jaundice Neurologic:  alert & oriented X3.  Speech normal, gait appropriate for age and unassisted Strength symmetric and appropriate for age.  Psych: Cognition and judgment appear intact.  Cooperative with normal attention span and concentration.  Behavior appropriate. No anxious or depressed appearing.     Assessment     Assessment: Prediabetes since 2004, a1c 6.2  HTN Gout CV, Pulm: -Acute biventricular CHF with cardiogenic shock Dx 03-2020 -Submassive pulmonary embolism and R leg DVT: Dx 03-2020, rx life long anticoag per cards note, 1/2 dose after 09-2020 - LV thrombus Morbid obesity  ED OSA Dx 06/2021 Asthma as a child Peyronie's disease   PLAN: Here for CPX, - Td 2015 - vax I Rec: PNM, Shingrix, covid vax, flu shot (fall) - CCS: cscope 05-2019, next cscope due, patient aware. -prostate ca screening: No symptoms, check PSA. -Diet and exercise: Doing well, follow-up by the wellness clinic. -Labs reviewed:  Check a PSA. Peyronie's DZ, Saw urology 09/09/2021 Dx Peyronie's disease, was recommended Xiaflex Morbid obesity: f/u at the  wellness clinic. Hyperglycemia: Last A1c 6.0. Cardiovascular: Sees Dr. Gala Romney, on multiple medications, he seems to be doing well. OSA: Dx 06/2021, on CPAP Increased hemoglobin: probably d/t OSA, reassess next year after he has to use CPAP consistently. Anxiety: Seen elsewhere. Allergies: Complaining of soft palate itching, recommend Flonase,  Allegra. RTC 1 year CPX.  Sooner if needed.

## 2022-07-05 ENCOUNTER — Other Ambulatory Visit (INDEPENDENT_AMBULATORY_CARE_PROVIDER_SITE_OTHER): Payer: Self-pay | Admitting: Family Medicine

## 2022-07-05 DIAGNOSIS — E559 Vitamin D deficiency, unspecified: Secondary | ICD-10-CM

## 2022-07-05 LAB — PSA: PSA: 1.14 ng/mL (ref 0.10–4.00)

## 2022-07-06 ENCOUNTER — Encounter: Payer: Self-pay | Admitting: Internal Medicine

## 2022-07-06 NOTE — Assessment & Plan Note (Signed)
-   Td 2015 - vax I Rec: PNM, Shingrix, covid vax, flu shot (fall) - CCS: cscope 05-2019, next cscope due, patient aware. -prostate ca screening: No symptoms, check PSA. -Diet and exercise: Doing well, follow-up by the wellness clinic. -Labs reviewed:  Check a PSA.

## 2022-07-06 NOTE — Assessment & Plan Note (Signed)
Here for CPXPeyronie's DZ, Saw urology 09/09/2021 Dx Peyronie's disease, was recommended Xiaflex Morbid obesity: f/u at the  wellness clinic. Hyperglycemia: Last A1c 6.0. Cardiovascular: Sees Dr. Gala Romney, on multiple medications, he seems to be doing well. OSA: Dx 06/2021, on CPAP Increased hemoglobin: probably d/t OSA, reassess next year after he has to use CPAP consistently. Anxiety: Seen elsewhere. Allergies: Complaining of soft palate itching, recommend Flonase,  Allegra. RTC 1 year CPX.  Sooner if needed.

## 2022-07-12 ENCOUNTER — Encounter (INDEPENDENT_AMBULATORY_CARE_PROVIDER_SITE_OTHER): Payer: Self-pay | Admitting: Family Medicine

## 2022-07-12 ENCOUNTER — Ambulatory Visit (INDEPENDENT_AMBULATORY_CARE_PROVIDER_SITE_OTHER): Payer: 59 | Admitting: Family Medicine

## 2022-07-12 VITALS — BP 123/85 | HR 82 | Temp 98.4°F | Ht 73.0 in | Wt 305.0 lb

## 2022-07-12 DIAGNOSIS — R7303 Prediabetes: Secondary | ICD-10-CM | POA: Diagnosis not present

## 2022-07-12 DIAGNOSIS — E559 Vitamin D deficiency, unspecified: Secondary | ICD-10-CM

## 2022-07-12 DIAGNOSIS — Z6841 Body Mass Index (BMI) 40.0 and over, adult: Secondary | ICD-10-CM | POA: Diagnosis not present

## 2022-07-12 MED ORDER — VITAMIN D (ERGOCALCIFEROL) 1.25 MG (50000 UNIT) PO CAPS
50000.0000 [IU] | ORAL_CAPSULE | ORAL | 0 refills | Status: DC
Start: 2022-07-12 — End: 2022-07-26

## 2022-07-12 NOTE — Progress Notes (Signed)
Carlye Grippe, D.O.  ABFM, ABOM Specializing in Clinical Bariatric Medicine  Office located at: [address] [place], [place]  [address]     Assessment and Plan:   Medications Discontinued During This Encounter  Medication Reason   Vitamin D, Ergocalciferol, (DRISDOL) 1.25 MG (50000 UNIT) CAPS capsule Reorder     Meds ordered this encounter  Medications   Vitamin D, Ergocalciferol, (DRISDOL) 1.25 MG (50000 UNIT) CAPS capsule    Sig: Take 1 capsule (50,000 Units total) by mouth every 7 (seven) days.    Dispense:  4 capsule    Refill:  0     Prediabetes Assessment: Condition is diet controlled and not optimized. Pt is not taking any prediabetic medication. He denies having any issues with hunger/cravings.   Lab Results  Component Value Date   HGBA1C 6.0 (H) 05/09/2022   HGBA1C 5.6 08/17/2020   HGBA1C 6.3 (H) 04/11/2020   INSULIN 25.7 (H) 05/09/2022    Plan: Continue to decrease simple carbs/ sugars; increase fiber and proteins -> follow his meal plan.  We will recheck A1c and fasting insulin level in approximately 3 months from last check, or as deemed appropriate.    Vitamin D deficiency Assessment: Condition is not at goal. He has been compliant and tolerant with Ergocalciferol 50K IU weekly. Denies any adverse effects.  Lab Results  Component Value Date   VD25OH 16.5 (L) 05/09/2022   Plan: Continue with supplement. Will reorder this today. Will continue to monitor levels regularly (every 3-4 mo on average) to keep levels within normal limits and prevent over supplementation.   TREATMENT PLAN FOR OBESITY: BMI 40.0-44.9, adult-current bmi 40.25 Morbid obesity-start bmi 41.9/date 05/09/22 Assessment:  Kevin Baxter is here to discuss his progress with his obesity treatment plan along with follow-up of his obesity related diagnoses. See Medical Weight Management Flowsheet for complete bioelectrical impedance results.  Condition is improving. Biometric  data collected today, was reviewed with patient.   Since last office visit on 06/14/22 patient's  Muscle mass has increased by 2.2 lb. Fat mass has decreased by 0.6 lb. Total body water has increased by 0.8 lb.  Counseling done on how various foods will affect these numbers and how to maximize success  Total lbs lost to date: 12  Total weight loss percentage to date: 3.79   Plan: Continue with Category 3 meal plan with additional breakfast and lunch options and with 4-6 ounces of protein at lunch and 10 ounces of protein at dinner.   Behavioral Intervention Additional resources provided today: patient declined Evidence-based interventions for health behavior change were utilized today including the discussion of self monitoring techniques, problem-solving barriers and SMART goal setting techniques.   Regarding patient's less desirable eating habits and patterns, we employed the technique of small changes.  Pt will specifically work on: following meal plan more closely, meal prepping, and walking 30 minutes, 2 days a week and lifting  30 minutes, 2 days a week for next visit.    Recommended Physical Activity Goals  Kevin Baxter has been advised to slowly work up to 150 minutes of moderate intensity aerobic activity a week and strengthening exercises 2-3 times per week for cardiovascular health, weight loss maintenance and preservation of muscle mass.   He has agreed to Continue current level of physical activity   FOLLOW UP: Return in about 2 weeks (around 07/26/2022). He was informed of the importance of frequent follow up visits to maximize his success with intensive lifestyle  modifications for his multiple health conditions.   Subjective:   Chief complaint: Obesity Kevin Baxter is here to discuss his progress with his obesity treatment plan. He is on  the Category 3 Plan with 4-6 ounces of protein at lunch and 10 ounces at dinner and states he is following his eating plan approximately 40 % of the  time. He states he is not exercising.   Interval History:  Kevin Baxter is here for a follow up office visit.     Since last office visit:    - Kevin Baxter has been doing well.   - He has been traveling for football camps and endorses eating some off plan foods such as salad dressings and some caloric beverages (Gatorade). He was very active  while traveling.   - He is back on track with his meal plan since returning.  - His hunger and cravings are well controlled.   Review of Systems:  Pertinent positives were addressed with patient today.  Reviewed by clinician on day of visit: allergies, medications, problem list, medical history, surgical history, family history, social history, and previous encounter notes.  Weight Summary and Biometrics   Weight Lost Since Last Visit: 0  Weight Gained Since Last Visit: 1    Vitals Temp: 98.4 F (36.9 C) BP: 123/85 Pulse Rate: 82 SpO2: 99 %   Anthropometric Measurements Height: 6\' 1"  (1.854 m) Weight: (!) 305 lb (138.3 kg) BMI (Calculated): 40.25 Weight at Last Visit: 304lb Weight Lost Since Last Visit: 0 Weight Gained Since Last Visit: 1 Starting Weight: 317lb Total Weight Loss (lbs): 12 lb (5.443 kg) Peak Weight: 333lb   Body Composition  Body Fat %: 37.4 % Fat Mass (lbs): 114.4 lbs Muscle Mass (lbs): 182 lbs Total Body Water (lbs): 128 lbs Visceral Fat Rating : 23   Other Clinical Data Fasting: no Labs: no Today's Visit #: 4 Starting Date: 05/09/22   Objective:   PHYSICAL EXAM: Blood pressure 123/85, pulse 82, temperature 98.4 F (36.9 C), height 6\' 1"  (1.854 m), weight (!) 305 lb (138.3 kg), SpO2 99 %. Body mass index is 40.24 kg/m.  General: Well Developed, well nourished, and in no acute distress.  HEENT: Normocephalic, atraumatic Skin: Warm and dry, cap RF less 2 sec, good turgor Chest:  Normal excursion, shape, no gross abn Respiratory: speaking in full sentences, no conversational  dyspnea NeuroM-Sk: Ambulates w/o assistance, moves * 4 Psych: A and O *3, insight good, mood-full  DIAGNOSTIC DATA REVIEWED:  BMET    Component Value Date/Time   NA 142 05/09/2022 0951   K 4.7 05/09/2022 0951   CL 101 05/09/2022 0951   CO2 25 05/09/2022 0951   GLUCOSE 83 05/09/2022 0951   GLUCOSE 101 (H) 02/10/2022 1208   GLUCOSE 90 11/22/2005 1016   BUN 13 05/09/2022 0951   CREATININE 1.17 05/09/2022 0951   CALCIUM 9.7 05/09/2022 0951   GFRNONAA >60 02/10/2022 1208   GFRAA 108 10/03/2007 1056   Lab Results  Component Value Date   HGBA1C 6.0 (H) 05/09/2022   HGBA1C 6.2 01/17/2013   Lab Results  Component Value Date   INSULIN 25.7 (H) 05/09/2022   Lab Results  Component Value Date   TSH 2.400 05/09/2022   CBC    Component Value Date/Time   WBC 4.9 05/09/2022 0951   WBC 5.7 02/10/2022 1208   RBC 6.99 (H) 05/09/2022 0951   RBC 7.55 (H) 02/10/2022 1208   HGB 18.2 (H) 05/09/2022 0951   HCT 57.1 (H)  05/09/2022 0951   PLT 210 05/09/2022 0951   MCV 82 05/09/2022 0951   MCH 26.0 (L) 05/09/2022 0951   MCH 25.7 (L) 02/10/2022 1208   MCHC 31.9 05/09/2022 0951   MCHC 32.0 02/10/2022 1208   RDW 17.8 (H) 05/09/2022 0951   Iron Studies    Component Value Date/Time   IRON 27 (L) 04/20/2020 1048   TIBC 231 (L) 04/20/2020 1048   FERRITIN 853 (H) 04/20/2020 1048   IRONPCTSAT 12 (L) 04/20/2020 1048   Lipid Panel     Component Value Date/Time   CHOL 201 (H) 05/09/2022 0951   TRIG 132 05/09/2022 0951   TRIG 69 11/22/2005 1016   HDL 59 05/09/2022 0951   CHOLHDL 3 08/17/2020 1055   VLDL 24.6 08/17/2020 1055   LDLCALC 119 (H) 05/09/2022 0951   Hepatic Function Panel     Component Value Date/Time   PROT 7.4 05/09/2022 0951   ALBUMIN 4.2 05/09/2022 0951   AST 17 05/09/2022 0951   ALT 19 05/09/2022 0951   ALKPHOS 77 05/09/2022 0951   BILITOT 0.9 05/09/2022 0951   BILIDIR 0.9 (H) 04/24/2020 0037   IBILI 1.5 (H) 04/24/2020 0037      Component Value Date/Time    TSH 2.400 05/09/2022 0951   Nutritional Lab Results  Component Value Date   VD25OH 16.5 (L) 05/09/2022    Attestations:   I, Special Puri, acting as a Stage manager for Thomasene Lot, DO., have compiled all relevant documentation for today's office visit on behalf of Thomasene Lot, DO, while in the presence of [employer], DO.  I have reviewed the above documentation for accuracy and completeness, and I agree with the above. Carlye Grippe, D.O.  The 21st Century Cures Act was signed into law in 2016 which includes the topic of electronic health records.  This provides immediate access to information in MyChart.  This includes consultation notes, operative notes, office notes, lab results and pathology reports.  If you have any questions about what you read please let us know at your next visit so we can discuss your concerns and take corrective action if need be.  We are right here with you.

## 2022-07-26 ENCOUNTER — Encounter (INDEPENDENT_AMBULATORY_CARE_PROVIDER_SITE_OTHER): Payer: Self-pay | Admitting: Family Medicine

## 2022-07-26 ENCOUNTER — Ambulatory Visit (INDEPENDENT_AMBULATORY_CARE_PROVIDER_SITE_OTHER): Payer: 59 | Admitting: Family Medicine

## 2022-07-26 VITALS — BP 126/87 | HR 85 | Temp 98.2°F | Ht 73.0 in | Wt 303.0 lb

## 2022-07-26 DIAGNOSIS — R7303 Prediabetes: Secondary | ICD-10-CM | POA: Diagnosis not present

## 2022-07-26 DIAGNOSIS — E559 Vitamin D deficiency, unspecified: Secondary | ICD-10-CM | POA: Diagnosis not present

## 2022-07-26 DIAGNOSIS — Z6841 Body Mass Index (BMI) 40.0 and over, adult: Secondary | ICD-10-CM

## 2022-07-26 DIAGNOSIS — Z6839 Body mass index (BMI) 39.0-39.9, adult: Secondary | ICD-10-CM | POA: Diagnosis not present

## 2022-07-26 MED ORDER — VITAMIN D (ERGOCALCIFEROL) 1.25 MG (50000 UNIT) PO CAPS: 50000.0000 [IU] | ORAL_CAPSULE | ORAL | 0 refills | Status: DC

## 2022-07-26 NOTE — Progress Notes (Signed)
Kevin Baxter, D.O.  ABFM, ABOM Specializing in Clinical Bariatric Medicine  Office located at: [address] [place], [place]  [address]     Assessment and Plan:   Medications Discontinued During This Encounter  Medication Reason   Vitamin D, Ergocalciferol, (DRISDOL) 1.25 MG (50000 UNIT) CAPS capsule Reorder     Meds ordered this encounter  Medications   Vitamin D, Ergocalciferol, (DRISDOL) 1.25 MG (50000 UNIT) CAPS capsule    Sig: Take 1 capsule (50,000 Units total) by mouth every 7 (seven) days.    Dispense:  4 capsule    Refill:  0     Prediabetes Assessment: Condition is not optimized. His prediabetes is currently diet controlled; he is not on any prediabetic medication. His hunger and cravings are mostly controlled when following the prudent nutritional plan. He does having cravings once in a while.   Lab Results  Component Value Date   HGBA1C 6.0 (H) 05/09/2022   HGBA1C 5.6 08/17/2020   HGBA1C 6.3 (H) 04/11/2020   INSULIN 25.7 (H) 05/09/2022    Plan: We discussed GLP1s and Metformin which can help [place] in the management of this disease process as well as with weight loss. We will check with his cardiologist Dr.Bensimhon about using a GLP-1 to aid in his weight loss and if he feels these medications will affect his heart and his questionable history of pancreatitis.   Continue his prudent nutritional plan that is low in simple carbohydrates, saturated fats and trans fats to goal of 5-10% weight loss to achieve significant health benefits.  Pt encouraged to continually advance exercise and cardiovascular fitness as tolerated throughout weight loss journey. Will continue to monitor condition closely alongside PCP.    Vitamin D deficiency Assessment: Condition is not optimized. No issues with Ergocalciferol 50K IU weekly, compliance good.   Lab Results  Component Value Date   VD25OH 16.5 (L) 05/09/2022   Plan: Continue with prescribed supplement at current  dose. Will refill ERGO today.    Weight loss will likely improve availability of vitamin D, thus encouraged Kevin Baxter to continue with meal plan and their weight loss efforts to further improve this condition.  Thus, we will need to monitor levels regularly (every 3-4 mo on average) to keep levels within normal limits and prevent over supplementation.   TREATMENT PLAN FOR OBESITY: BMI 40.0-44.9, adult (HCC)- current BMI 39.98 Morbid obesity (HCC) Assessment: Kevin Baxter is here to discuss his progress with his obesity treatment plan along with follow-up of his obesity related diagnoses. See Medical Weight Management Flowsheet for complete bioelectrical impedance results.  Condition is improving.  Biometric data collected today, was reviewed with patient.   Since last office visit on 07/12/22 patient's  Muscle mass has increased by 2.4 lb. Fat mass has decreased by 5 lb. Total body water has decreased by 4 lb.  Counseling done on how various foods will affect these numbers and how to maximize success  Total lbs lost to date: 14 Total weight loss percentage to date: 4.42   Plan: Continue with Category 3 meal plan with additional breakfast and lunch options and with 4-6 ounces of protein at lunch and 10 ounces of protein at dinner.   Behavioral Intervention Additional resources provided today: patient declined Evidence-based interventions for health behavior change were utilized today including the discussion of self monitoring techniques, problem-solving barriers and SMART goal setting techniques.   Regarding patient's less desirable eating habits and patterns, we employed the technique of  small changes.  Pt will specifically work on: not skipping meals, continuing to increase lean protein intake, working on mindful eating, and walking 30 minutes 3 days a wk for next visit.    Recommended Physical Activity Goals  [name] has been advised to slowly work up to 150 minutes of moderate  intensity aerobic activity a week and strengthening exercises 2-3 times per week for cardiovascular health, weight loss maintenance and preservation of muscle mass.   He has agreed to Continue current level of physical activity   FOLLOW UP: Return 2-3 wks. He was informed of the importance of frequent follow up visits to maximize his success with intensive lifestyle modifications for his multiple health conditions.  Subjective:   Chief complaint: Obesity [name] is here to discuss his progress with his obesity treatment plan. He is on the Category 3 Plan with additional breakfast and lunch options and with 4-6 ounces of protein at lunch and 10 ounces of protein at dinner and states he is following his eating plan approximately 50-60% of the time. He states he is not exercising.   Interval History:  Kevin Baxter Baxter is here for a follow up office visit.   Since last OV, [name] has been doing well. He endorses eating off plan foods on a few occasions, such as a chocolate cake. Sometimes, he skipped lunch on busy work days. Overall, his hunger and cravings are pretty well controlled.   Review of Systems:  Pertinent positives were addressed with patient today.  Reviewed by clinician on day of visit: allergies, medications, problem list, medical history, surgical history, family history, social history, and previous encounter notes.  Weight Summary and Biometrics   Weight Lost Since Last Visit: 2lb  Weight Gained Since Last Visit: 0    Vitals Temp: 98.2 F (36.8 C) BP: 126/87 Pulse Rate: 85 SpO2: 94 %   Anthropometric Measurements Height: 6\' 1"  (1.854 m) Weight: (!) 303 lb (137.4 kg) BMI (Calculated): 39.98 Weight at Last Visit: 305lb Weight Lost Since Last Visit: 2lb Weight Gained Since Last Visit: 0 Starting Weight: 317lb Total Weight Loss (lbs): 14 lb (6.35 kg) Peak Weight: 333lb   Body Composition  Body Fat %: 36.1 % Fat Mass (lbs): 109.4 lbs Muscle Mass (lbs): 184.4  lbs Total Body Water (lbs): 124 lbs Visceral Fat Rating : 22   Other Clinical Data Fasting: no Labs: no Today's Visit #: 5 Starting Date: 05/09/22   Objective:   PHYSICAL EXAM: Blood pressure 126/87, pulse 85, temperature 98.2 F (36.8 C), height 6\' 1"  (1.854 m), weight (!) 303 lb (137.4 kg), SpO2 94 %. Body mass index is 39.98 kg/m.  General: Well Developed, well nourished, and in no acute distress.  HEENT: Normocephalic, atraumatic Skin: Warm and dry, cap RF less 2 sec, good turgor Chest:  Normal excursion, shape, no gross abn Respiratory: speaking in full sentences, no conversational dyspnea NeuroM-Sk: Ambulates w/o assistance, moves * 4 Psych: A and O *3, insight good, mood-full  DIAGNOSTIC DATA REVIEWED:  BMET    Component Value Date/Time   NA 142 05/09/2022 0951   K 4.7 05/09/2022 0951   CL 101 05/09/2022 0951   CO2 25 05/09/2022 0951   GLUCOSE 83 05/09/2022 0951   GLUCOSE 101 (H) 02/10/2022 1208   GLUCOSE 90 11/22/2005 1016   BUN 13 05/09/2022 0951   CREATININE 1.17 05/09/2022 0951   CALCIUM 9.7 05/09/2022 0951   GFRNONAA >60 02/10/2022 1208   GFRAA 108 10/03/2007 1056   Lab Results  Component Value Date   HGBA1C 6.0 (H) 05/09/2022   HGBA1C 6.2 01/17/2013   Lab Results  Component Value Date   INSULIN 25.7 (H) 05/09/2022   Lab Results  Component Value Date   TSH 2.400 05/09/2022   CBC    Component Value Date/Time   WBC 4.9 05/09/2022 0951   WBC 5.7 02/10/2022 1208   RBC 6.99 (H) 05/09/2022 0951   RBC 7.55 (H) 02/10/2022 1208   HGB 18.2 (H) 05/09/2022 0951   HCT 57.1 (H) 05/09/2022 0951   PLT 210 05/09/2022 0951   MCV 82 05/09/2022 0951   MCH 26.0 (L) 05/09/2022 0951   MCH 25.7 (L) 02/10/2022 1208   MCHC 31.9 05/09/2022 0951   MCHC 32.0 02/10/2022 1208   RDW 17.8 (H) 05/09/2022 0951   Iron Studies    Component Value Date/Time   IRON 27 (L) 04/20/2020 1048   TIBC 231 (L) 04/20/2020 1048   FERRITIN 853 (H) 04/20/2020 1048    IRONPCTSAT 12 (L) 04/20/2020 1048   Lipid Panel     Component Value Date/Time   CHOL 201 (H) 05/09/2022 0951   TRIG 132 05/09/2022 0951   TRIG 69 11/22/2005 1016   HDL 59 05/09/2022 0951   CHOLHDL 3 08/17/2020 1055   VLDL 24.6 08/17/2020 1055   LDLCALC 119 (H) 05/09/2022 0951   Hepatic Function Panel     Component Value Date/Time   PROT 7.4 05/09/2022 0951   ALBUMIN 4.2 05/09/2022 0951   AST 17 05/09/2022 0951   ALT 19 05/09/2022 0951   ALKPHOS 77 05/09/2022 0951   BILITOT 0.9 05/09/2022 0951   BILIDIR 0.9 (H) 04/24/2020 0037   IBILI 1.5 (H) 04/24/2020 0037      Component Value Date/Time   TSH 2.400 05/09/2022 0951   Nutritional Lab Results  Component Value Date   VD25OH 16.5 (L) 05/09/2022    Attestations:   Patient was in the office today and time spent on visit including pre-visit chart review and post-visit care/coordination of care and electronic medical record documentation was 40 minutes. 50% of the time was in face to face counseling of this patient's medical condition(s) and providing education on treatment options to include the first-line treatment of diet and lifestyle modification.   I, Special Randolm Idol, acting as a Stage manager for [employer], DO., have compiled all relevant documentation for today's office visit on behalf of Thomasene Lot, DO, while in the presence of [employer], DO.  I have reviewed the above documentation for accuracy and completeness, and I agree with the above. Kevin Baxter, D.O.  The 21st Century Cures Act was signed into law in 2016 which includes the topic of electronic health records.  This provides immediate access to information in MyChart.  This includes consultation notes, operative notes, office notes, lab results and pathology reports.  If you have any questions about what you read please let us know at your next visit so we can discuss your concerns and take corrective action if need be.  We are right here with  you.

## 2022-08-21 ENCOUNTER — Encounter (INDEPENDENT_AMBULATORY_CARE_PROVIDER_SITE_OTHER): Payer: Self-pay | Admitting: Family Medicine

## 2022-08-21 ENCOUNTER — Ambulatory Visit (INDEPENDENT_AMBULATORY_CARE_PROVIDER_SITE_OTHER): Payer: 59 | Admitting: Family Medicine

## 2022-08-21 VITALS — Ht 73.0 in

## 2022-08-21 DIAGNOSIS — Z6841 Body Mass Index (BMI) 40.0 and over, adult: Secondary | ICD-10-CM

## 2022-08-21 DIAGNOSIS — R7303 Prediabetes: Secondary | ICD-10-CM

## 2022-08-21 DIAGNOSIS — E559 Vitamin D deficiency, unspecified: Secondary | ICD-10-CM

## 2022-08-23 MED ORDER — VITAMIN D (ERGOCALCIFEROL) 1.25 MG (50000 UNIT) PO CAPS
50000.0000 [IU] | ORAL_CAPSULE | ORAL | 0 refills | Status: DC
Start: 2022-08-23 — End: 2022-09-11

## 2022-08-31 NOTE — Progress Notes (Signed)
Pt was ill and apparently left office prior to being seen.  - Per front desk staff notes, "   pt stated that he felt like he was going to vomit. Pt left."

## 2022-09-04 ENCOUNTER — Ambulatory Visit (INDEPENDENT_AMBULATORY_CARE_PROVIDER_SITE_OTHER): Payer: 59 | Admitting: Family Medicine

## 2022-09-06 ENCOUNTER — Ambulatory Visit (INDEPENDENT_AMBULATORY_CARE_PROVIDER_SITE_OTHER): Payer: 59 | Admitting: Family Medicine

## 2022-09-11 ENCOUNTER — Encounter (INDEPENDENT_AMBULATORY_CARE_PROVIDER_SITE_OTHER): Payer: Self-pay | Admitting: Family Medicine

## 2022-09-11 ENCOUNTER — Ambulatory Visit (INDEPENDENT_AMBULATORY_CARE_PROVIDER_SITE_OTHER): Payer: 59 | Admitting: Family Medicine

## 2022-09-11 VITALS — BP 129/85 | HR 86 | Temp 98.1°F | Ht 73.0 in | Wt 308.0 lb

## 2022-09-11 DIAGNOSIS — R7303 Prediabetes: Secondary | ICD-10-CM

## 2022-09-11 DIAGNOSIS — E559 Vitamin D deficiency, unspecified: Secondary | ICD-10-CM

## 2022-09-11 DIAGNOSIS — Z6841 Body Mass Index (BMI) 40.0 and over, adult: Secondary | ICD-10-CM

## 2022-09-11 MED ORDER — VITAMIN D (ERGOCALCIFEROL) 1.25 MG (50000 UNIT) PO CAPS: 50000.0000 [IU] | ORAL_CAPSULE | ORAL | 0 refills | Status: DC

## 2022-09-11 NOTE — Progress Notes (Signed)
Kevin Baxter, D.O.  ABFM, ABOM Specializing in Clinical Bariatric Medicine  Office located at: [address] [place], [place]  [address]     Assessment and Plan:  Next OV recheck A1c, fasting insulin, vitamin D, and fasting lipid profile.   Medications Discontinued During This Encounter  Medication Reason   Vitamin D, Ergocalciferol, (DRISDOL) 1.25 MG (50000 UNIT) CAPS capsule Reorder    Meds ordered this encounter  Medications   Vitamin D, Ergocalciferol, (DRISDOL) 1.25 MG (50000 UNIT) CAPS capsule    Sig: Take 1 capsule (50,000 Units total) by mouth every 7 (seven) days.    Dispense:  4 capsule    Refill:  0    Vitamin D deficiency Assessment: Condition is Not at goal.. Pt vitamin D levels are very low. He has been compliant and tolerant with ERGO once weekly. He denies any adverse side effects.  Lab Results  Component Value Date   VD25OH 16.5 (L) 05/09/2022   Plan: - Continue with Ergocalciferol 50K IU weekly and he was encouraged to continue to take the medicine until told otherwise.   I will refill this today.   - We will continue to monitor levels regularly every 3-4 mo on average to keep levels within normal limits and prevent over supplementation.   - We will adjust treatment as deemed clinically necessary.    Prediabetes Assessment: Condition is Not optimized.. This is diet/exercise controlled. He endorses having hunger and craving when on vacation/not following the prescribed nutritional plan.  Lab Results  Component Value Date   HGBA1C 6.0 (H) 05/09/2022   HGBA1C 5.6 08/17/2020   HGBA1C 6.3 (H) 04/11/2020   INSULIN 25.7 (H) 05/09/2022    Plan: - Continue to adhere to the prescribed nutritional plan to work on weight loss to help decrease the risk of progressing to diabetes.    - In addition, we discussed the risks and benefits of various medication options such as Metformin which can help [place] in the management of this disease process as well as with  weight loss.  Will consider starting one of these meds in future as we will focus on prudent nutritional plan at this time.   - Handouts provided at pt's request after education provided.  All concerns/questions addressed.    - We had a long discussion about GLP1 medication options and how they can help improve his CHF. He understands to also discuss this with his cardiologist for further information about the benefits and risks on his heart with this. Pt declines medication at this time and wants to work on exercising and diet to control his prediabetes first before considering this medication.   - Anticipatory guidance given.    - We will recheck A1c and fasting insulin level in approximately 3 months from last check, or as deemed appropriate.    TREATMENT PLAN FOR OBESITY: BMI 40.0-44.9, adult (HCC)- current BMI 40.64 Morbid obesity (HCC) Assessment:  AZEEZ SECK III is here to discuss his progress with his obesity treatment plan along with follow-up of his obesity related diagnoses. See Medical Weight Management Flowsheet for complete bioelectrical impedance results.  Condition is not optimized. Biometric data collected today, was reviewed with patient.   Since last office visit on 07/26/2022 patient's  Muscle mass has decreased by 1.8lb. Fat mass has increased by 7.4lb. Total body water has increased by 4lb.  Counseling done on how various foods will affect these numbers and how to maximize success  Total lbs lost to date:  9lbs Total weight loss percentage to date: 2.84%   Plan: - Continue to adhere with the prescribed Category 3 meal plan with B and L options and consuming 4-6 ounces of protein at lunch and 10 ounces of protein at dinner.    Behavioral Intervention Additional resources provided today:  GLP1 guide handout Evidence-based interventions for health behavior change were utilized today including the discussion of self monitoring techniques, problem-solving barriers and  SMART goal setting techniques.   Regarding patient's less desirable eating habits and patterns, we employed the technique of small changes.  Pt will specifically work on: Getting back to following his nutritional meal plan for next visit.    Recommended Physical Activity Goals  Sasha has been advised to slowly work up to 150 minutes of moderate intensity aerobic activity a week and strengthening exercises 2-3 times per week for cardiovascular health, weight loss maintenance and preservation of muscle mass.   He has agreed to Continue current level of physical activity    FOLLOW UP: Return in about 2 weeks (around 09/25/2022).  He was informed of the importance of frequent follow up visits to maximize his success with intensive lifestyle modifications for his multiple health conditions.  Subjective:   Chief complaint: Obesity [place] is here to discuss his progress with his obesity treatment plan. He is on the the Category 3 Plan with breakfast and lunch options with 4-6 oz at lunch and 10oz at dinner and states he is following his eating plan approximately 0% of the time. He states he is walking 30 minutes 5 days per week.  Interval History:  ALWALEED HODGEN III is here for a follow up office visit.     Since last OV, pt informed me that he has recently got over COVID while on vacation which mainly consisted of GI symptoms. He notes that his biggest challenge was eating on plan while on vacation in [place]. He followed the prescribed plan for the first 2 days but did indulge in some alcohol, take out, and restaurants for the rest of the 2 weeks. He notes trying to get back on plan for the past couple of days but is not following it 100%.   We reviewed his meal plan and all questions were answered. Patient's food recall appears to be accurate.    Review of Systems:  Pertinent positives were addressed with patient today.   Reviewed by clinician on day of visit: allergies, medications,  problem list, medical history, surgical history, family history, social history, and previous encounter notes.  Weight Summary and Biometrics   Weight Lost Since Last Visit: 0lb  Weight Gained Since Last Visit: 5lb   Vitals Temp: 98.1 F (36.7 C) BP: 129/85 Pulse Rate: 86 SpO2: 97 %   Anthropometric Measurements Height: 6\' 1"  (1.854 m) Weight: (!) 308 lb (139.7 kg) BMI (Calculated): 40.64 Weight at Last Visit: 303lb Weight Lost Since Last Visit: 0lb Weight Gained Since Last Visit: 5lb Starting Weight: 317lb Total Weight Loss (lbs): 9 lb (4.082 kg) Peak Weight: 333lb   Body Composition  Body Fat %: 37.8 % Fat Mass (lbs): 116.8 lbs Muscle Mass (lbs): 182.6 lbs Total Body Water (lbs): 128 lbs Visceral Fat Rating : 23   Other Clinical Data Fasting: no Labs: no Today's Visit #: 6 Starting Date: 05/09/22     Objective:   PHYSICAL EXAM: Blood pressure 129/85, pulse 86, temperature 98.1 F (36.7 C), height 6\' 1"  (1.854 m), weight (!) 308 lb (139.7 kg), SpO2 97%. Body mass  index is 40.64 kg/m.  General: Well Developed, well nourished, and in no acute distress.  HEENT: Normocephalic, atraumatic Skin: Warm and dry, cap RF less 2 sec, good turgor Chest:  Normal excursion, shape, no gross abn Respiratory: speaking in full sentences, no conversational dyspnea NeuroM-Sk: Ambulates w/o assistance, moves * 4 Psych: A and O *3, insight good, mood-full  DIAGNOSTIC DATA REVIEWED:  BMET    Component Value Date/Time   NA 142 05/09/2022 0951   K 4.7 05/09/2022 0951   CL 101 05/09/2022 0951   CO2 25 05/09/2022 0951   GLUCOSE 83 05/09/2022 0951   GLUCOSE 101 (H) 02/10/2022 1208   GLUCOSE 90 11/22/2005 1016   BUN 13 05/09/2022 0951   CREATININE 1.17 05/09/2022 0951   CALCIUM 9.7 05/09/2022 0951   GFRNONAA >60 02/10/2022 1208   GFRAA 108 10/03/2007 1056   Lab Results  Component Value Date   HGBA1C 6.0 (H) 05/09/2022   HGBA1C 6.2 01/17/2013   Lab Results   Component Value Date   INSULIN 25.7 (H) 05/09/2022   Lab Results  Component Value Date   TSH 2.400 05/09/2022   CBC    Component Value Date/Time   WBC 4.9 05/09/2022 0951   WBC 5.7 02/10/2022 1208   RBC 6.99 (H) 05/09/2022 0951   RBC 7.55 (H) 02/10/2022 1208   HGB 18.2 (H) 05/09/2022 0951   HCT 57.1 (H) 05/09/2022 0951   PLT 210 05/09/2022 0951   MCV 82 05/09/2022 0951   MCH 26.0 (L) 05/09/2022 0951   MCH 25.7 (L) 02/10/2022 1208   MCHC 31.9 05/09/2022 0951   MCHC 32.0 02/10/2022 1208   RDW 17.8 (H) 05/09/2022 0951   Iron Studies    Component Value Date/Time   IRON 27 (L) 04/20/2020 1048   TIBC 231 (L) 04/20/2020 1048   FERRITIN 853 (H) 04/20/2020 1048   IRONPCTSAT 12 (L) 04/20/2020 1048   Lipid Panel     Component Value Date/Time   CHOL 201 (H) 05/09/2022 0951   TRIG 132 05/09/2022 0951   TRIG 69 11/22/2005 1016   HDL 59 05/09/2022 0951   CHOLHDL 3 08/17/2020 1055   VLDL 24.6 08/17/2020 1055   LDLCALC 119 (H) 05/09/2022 0951   Hepatic Function Panel     Component Value Date/Time   PROT 7.4 05/09/2022 0951   ALBUMIN 4.2 05/09/2022 0951   AST 17 05/09/2022 0951   ALT 19 05/09/2022 0951   ALKPHOS 77 05/09/2022 0951   BILITOT 0.9 05/09/2022 0951   BILIDIR 0.9 (H) 04/24/2020 0037   IBILI 1.5 (H) 04/24/2020 0037      Component Value Date/Time   TSH 2.400 05/09/2022 0951   Nutritional Lab Results  Component Value Date   VD25OH 16.5 (L) 05/09/2022    Attestations:   This encounter took 40 total minutes of time including any pre-visit and post-visit time spent on this date of service, including taking a thorough history, reviewing any labs and/or imaging, reviewing prior notes, as well as documenting in the electronic health record on the date of service. Over 50% of that time was in direct face-to-face counseling and coordinating care for the patient today  I, Clinical biochemist, acting as a Stage manager for [employer], DO., have compiled all  relevant documentation for today's office visit on behalf of Thomasene Lot, DO, while in the presence of [employer], DO.  I have reviewed the above documentation for accuracy and completeness, and I agree with the above. Ezzard Standing , D.O.  The 21st Century Cures Act was signed into law in 2016 which includes the topic of electronic health records.  This provides immediate access to information in MyChart.  This includes consultation notes, operative notes, office notes, lab results and pathology reports.  If you have any questions about what you read please let us know at your next visit so we can discuss your concerns and take corrective action if need be.  We are right here with you.

## 2022-09-25 ENCOUNTER — Ambulatory Visit (INDEPENDENT_AMBULATORY_CARE_PROVIDER_SITE_OTHER): Payer: 59 | Admitting: Family Medicine

## 2022-09-29 ENCOUNTER — Other Ambulatory Visit: Payer: Self-pay | Admitting: Internal Medicine

## 2022-10-11 ENCOUNTER — Telehealth (HOSPITAL_COMMUNITY): Payer: Self-pay | Admitting: Pharmacy Technician

## 2022-10-11 ENCOUNTER — Other Ambulatory Visit (HOSPITAL_COMMUNITY): Payer: Self-pay

## 2022-10-11 NOTE — Telephone Encounter (Signed)
Patient Advocate Encounter   Received notification from [facility] that prior authorization for Corlanor is required.   PA submitted on CoverMyMeds Key [id number] Status is pending   Will continue to follow.

## 2022-10-11 NOTE — Telephone Encounter (Signed)
Advanced Heart Failure Patient Advocate Encounter  Prior Authorization for Corlanor has been approved.    PA# [id number] Effective dates: 10/11/22 through 10/11/23  Patients co-pay is $240 (90 days) Called and spoke with the patient.   Archer Asa, CPhT

## 2022-10-16 ENCOUNTER — Ambulatory Visit (INDEPENDENT_AMBULATORY_CARE_PROVIDER_SITE_OTHER): Payer: 59 | Admitting: Family Medicine

## 2022-12-05 ENCOUNTER — Encounter (HOSPITAL_COMMUNITY): Payer: Self-pay | Admitting: Internal Medicine

## 2022-12-05 ENCOUNTER — Ambulatory Visit (HOSPITAL_COMMUNITY)
Admission: RE | Admit: 2022-12-05 | Discharge: 2022-12-05 | Disposition: A | Discharge status: Home / Self Care | Payer: 59 | Source: Ambulatory Visit | Attending: Internal Medicine | Admitting: Internal Medicine

## 2022-12-05 VITALS — BP 130/88 | HR 87 | Wt 323.2 lb

## 2022-12-05 DIAGNOSIS — I10 Essential (primary) hypertension: Secondary | ICD-10-CM

## 2022-12-05 DIAGNOSIS — I513 Intracardiac thrombosis, not elsewhere classified: Secondary | ICD-10-CM | POA: Insufficient documentation

## 2022-12-05 DIAGNOSIS — G4733 Obstructive sleep apnea (adult) (pediatric): Secondary | ICD-10-CM

## 2022-12-05 DIAGNOSIS — R7303 Prediabetes: Secondary | ICD-10-CM | POA: Diagnosis not present

## 2022-12-05 DIAGNOSIS — I2609 Other pulmonary embolism with acute cor pulmonale: Secondary | ICD-10-CM

## 2022-12-05 DIAGNOSIS — I5022 Chronic systolic (congestive) heart failure: Secondary | ICD-10-CM | POA: Diagnosis not present

## 2022-12-05 LAB — CBC
HCT: 59.1 % — ABNORMAL HIGH (ref 39.0–52.0)
Hemoglobin: 18.4 g/dL — ABNORMAL HIGH (ref 13.0–17.0)
MCH: 25.1 pg — ABNORMAL LOW (ref 26.0–34.0)
MCHC: 31.1 g/dL (ref 30.0–36.0)
MCV: 80.6 fL (ref 80.0–100.0)
Platelets: 233 10*3/uL (ref 150–400)
RBC: 7.33 MIL/uL — ABNORMAL HIGH (ref 4.22–5.81)
RDW: 18.3 % — ABNORMAL HIGH (ref 11.5–15.5)
WBC: 4.4 10*3/uL (ref 4.0–10.5)
nRBC: 0 % (ref 0.0–0.2)

## 2022-12-05 LAB — BASIC METABOLIC PANEL
Anion gap: 8 (ref 5–15)
BUN: 12 mg/dL (ref 6–20)
CO2: 27 mmol/L (ref 22–32)
Calcium: 9.5 mg/dL (ref 8.9–10.3)
Chloride: 103 mmol/L (ref 98–111)
Creatinine, Ser: 1.18 mg/dL (ref 0.61–1.24)
GFR, Estimated: >60 mL/min (ref >60)
Glucose, Bld: 86 mg/dL (ref 70–99)
Potassium: 4.7 mmol/L (ref 3.5–5.1)
Sodium: 138 mmol/L (ref 135–145)

## 2022-12-05 LAB — BRAIN NATRIURETIC PEPTIDE: B Natriuretic Peptide: 31.1 pg/mL (ref 0.0–100.0)

## 2022-12-05 MED ORDER — APIXABAN 2.5 MG PO TABS: 2.5000 mg | ORAL_TABLET | Freq: Two times a day (BID) | ORAL | 11 refills | Status: DC

## 2022-12-05 MED ORDER — CARVEDILOL 6.25 MG PO TABS: 9.3750 mg | ORAL_TABLET | Freq: Two times a day (BID) | ORAL | 2 refills | Status: DC

## 2022-12-05 NOTE — Progress Notes (Signed)
ADVANCED HF CLINIC VISIT  PCP: [name], MD Primary Cardiologists: Armanda Magic, Resha Filippone  HPI: Kevin Baxter is a 52 y.o. male with HTN, morbid obesity, pancreatitis, DVT with submassive PE and systolic HF due to NICM.  Admitted in 3/22 for intractable n/v. Initially felt to have acute cholecystitis. Echo showed EF 15% with mod RV dysfunction. + large LV clot. Started on inotropes. Cath showed normal coronaries. cMRI LVEF 12% Apical akinesis Elevated ECV in apex. RVEF 21%.   Post -cath developed worsening CP and tachycardia. CT showed bilateral submassive PE with pulmonary infarct. Underwent catheter-based thrombectomy of PE. Milrinone slowly weaned off. Discharged 04/24/20.    Echo 06/09/20 EF 25-30% RV mild HK ? Small laminated apical clot  Echo  12/10/20 EF 30-35% Apical AK with small apical thrombus RV ok  CMRI 12/22 EF 33% RV 40% Apioal thrombus resolved.   Today he returns for HF follow up. Working with [employer]. Says he feels good. Denies CP or SOB. Has gained 20 pounds in 3 months. Says he likes donuts.  Check BP regularly 120-/70s. Compliant with meds. Just started back exercising 3 weeks ago No edema, orthopnea or PND.   Echo  05/17/22 EF 40-45% RV ok   ROS: All systems negative except as listed in HPI, PMH and Problem List.  SH:  Social History   Socioeconomic History   Marital status: Married    Spouse name: Not on file   Number of children: 2   Years of education: Not on file   Highest education level: Some college, no degree  Occupational History   Occupation: works for [employer]  Tobacco Use   Smoking status: Never   Smokeless tobacco: Never  Vaping Use   Vaping status: Never Used  Substance and Sexual Activity   Alcohol use: Not Currently    Comment: very rare    Drug use: No   Sexual activity: Not on file  Other Topics Concern   Not on file  Social History Narrative   Household: pt, wife daughter (2013),   boy  (2006),    Social Determinants of Corporate investment banker Strain: Not on file  Food Insecurity: Not on file  Transportation Needs: Not on file  Physical Activity: Not on file  Stress: Not on file  Social Connections: Unknown (06/12/2021)   Received from [facility], [facility]   Social Network    Social Network: Not on file  Intimate Partner Violence: Unknown (05/04/2021)   Received from [facility], [facility]   HITS    Physically Hurt: Not on file    Insult or Talk Down To: Not on file    Threaten Physical Harm: Not on file    Scream or Curse: Not on file    FH:  Family History  Problem Relation Age of Onset   Cancer Mother        CERVICAL   Hypertension Father    Heart failure Father        no MI, d/t etoh   Heart disease Father    Alcoholism Father    Diabetes Sister    Diabetes Sister    Diabetes Paternal Aunt    Diabetes Cousin    Colon cancer Neg Hx    Prostate cancer Neg Hx  Stroke Neg Hx    Esophageal cancer Neg Hx    Rectal cancer Neg Hx    Stomach cancer Neg Hx     Past Medical History:  Diagnosis Date   Allergy    Anxiety    Arthritis    Back pain    CHF (congestive heart failure) (HCC)    Constipation    Depression    ED (erectile dysfunction)    Fundic gland polyposis of stomach    GERD with esophagitis    Gout    History of blood clots    Hx of adenomatous colonic polyps    Hypertension    IBS (irritable bowel syndrome)    Joint pain    Lactose intolerance    OSA on CPAP    Palpitations    Sleep apnea     Current Outpatient Medications  Medication Sig Dispense Refill   allopurinol (ZYLOPRIM) 300 MG tablet Take 1 tablet (300 mg total) by mouth daily. 90 tablet 3   apixaban (ELIQUIS) 5 MG TABS tablet TAKE 1 TABLET BY MOUTH TWICE A DAY 60 tablet 11   carvedilol (COREG) 6.25 MG tablet TAKE 1 TABLET BY MOUTH TWICE A DAY WITH MEALS 180 tablet 2   CORLANOR 5 MG TABS tablet TAKE 1 TABLET (5 MG TOTAL) BY MOUTH 2 (TWO)  TIMES DAILY WITH A MEAL. 180 tablet 1   JARDIANCE 10 MG TABS tablet TAKE 1 TABLET BY MOUTH EVERY DAY 90 tablet 3   sacubitril-valsartan (ENTRESTO) 49-51 MG Take 1 tablet by mouth 2 (two) times daily. 60 tablet 11   sertraline (ZOLOFT) 100 MG tablet Take 1 tablet (100 mg total) by mouth daily. 90 tablet 3   sildenafil (VIAGRA) 50 MG tablet Take 1 tablet (50 mg total) by mouth daily as needed for erectile dysfunction. 10 tablet 6   spironolactone (ALDACTONE) 25 MG tablet TAKE 1/2 TABLET BY MOUTH EVERY DAY 45 tablet 4   No current facility-administered medications for this encounter.    Vitals:   12/05/22 1126  BP: 130/88  Pulse: 87  SpO2: 97%  Weight: (!) 146.6 kg (323 lb 3.2 oz)    Wt Readings from Last 3 Encounters:  12/05/22 (!) 146.6 kg (323 lb 3.2 oz)  09/11/22 (!) 139.7 kg (308 lb)  07/26/22 (!) 137.4 kg (303 lb)    PHYSICAL EXAM: General:  Well appearing. No resp difficulty HEENT: normal Neck: supple. no JVD. Carotids 2+ bilat; no bruits. No lymphadenopathy or thryomegaly appreciated. Cor: PMI nondisplaced. Regular rate & rhythm. No rubs, gallops or murmurs. Lungs: clear Abdomen: obese soft, nontender, nondistended. No hepatosplenomegaly. No bruits or masses. Good bowel sounds. Extremities: no cyanosis, clubbing, rash, edema Neuro: alert & orientedx3, cranial nerves grossly intact. moves all 4 extremities w/o difficulty. Affect pleasant  ECG: NSR 80 with 1 AVB No ST-T wave abnormalities. Personally reviewed   ASSESSMENT & PLAN:   1. Chronic biventricular CHF with cardiogenic shock: - ECHO 3/22  LVEF < 20% with large LV thrombus,mild to moderate RV dysfunction - Cardiac catheterization 04/16/20 with normal coronaries - cMRI 3/22 LVEF 12% RVEF 21%. Apical akinesis Elevated ECV in apex. No LGE in apex. RVEF 21%. - also complicated by RV failure from PE - Echo  06/09/20 EF 25-30% RV mild HK ? Small laminated apical clot  - Echo 12/10/20 EF 30-35% Apical AK with small  apical thrombus RV ok - CMRI 12/2020 EF 33% RV 40%. No LV thrombus - Echo 05/17/22 EF 40-45% RV ok. (  EF slowly improving)  - NYHA I  - Volume status ok  - Continue spironolactone 12.5 - Continue entresto to 49/51 BID - Continue ivabradine to 5 bid - Continue jardiance 10  - Increase carvedilol 9.375 bid - Labs today  2. LV thrombus: - MRI clot resolved.  - On Eliquis for h/o DVT/PE   3. 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 Also R femoral DVT - Chest CT 3/22 bilateral submassive PE with RV strain and RUL infarct - s/p Penumbra clot extraction 04/16/20 - Echo 11/2020 RV normal  - Will need lifelong Eliquis. Switch to eliquis 2.5 bid based on Amplify-EXT  - No bleeding  4. HTN  - Blood pressure well controlled. Continue current regimen.  5. Depression/anxiety - Continue zoloft   6. Morbid obesity - Body mass index is 42.64 kg/m. - Refer to PharmD for [id number]   Arvilla Meres, MD  12:29 PM

## 2022-12-05 NOTE — Patient Instructions (Signed)
Medication Changes:  Increase Carvedilol to 9.375 mg (1 & 1/2 tabs) Twice daily   Decrease Eliquis to 2.5 mg Twice daily   Lab Work:  Labs done today, your results will be available in MyChart, we will contact you for abnormal readings.  Testing/Procedures:  none  Referrals:  You have been referred to Pharmacy Clinic at [facility] for weight loss medication, they will call you to discuss  Special Instructions // Education:  Do the following things EVERYDAY: Weigh yourself in the morning before breakfast. Write it down and keep it in a log. Take your medicines as prescribed Eat low salt foods--Limit salt (sodium) to 2000 mg per day.  Stay as active as you can everyday Limit all fluids for the day to less than 2 liters   Follow-Up in: 6 months with an echocardiogram (May 2025), **PLEASE CALL OUR OFFICE IN MARCH TO SCHEDULE THIS APPOINTMENT   At the Advanced Heart Failure Clinic, you and your health needs are our priority. We have a designated team specialized in the treatment of Heart Failure. This Care Team includes your primary Heart Failure Specialized Cardiologist (physician), Advanced Practice Providers (APPs- Physician Assistants and Nurse Practitioners), and Pharmacist who all work together to provide you with the care you need, when you need it.   You may see any of the following providers on your designated Care Team at your next follow up:  Dr. Arvilla Meres Dr. Marca Ancona Dr. Dorthula Nettles Dr. Theresia Bough Tonye Becket, NP Robbie Lis, [place] [facility] [place], [place] Brynda Peon, NP [place] Lee, NP Karle Plumber, PharmD   Please be sure to bring in all your medications bottles to every appointment.   Need to Contact us:  If you have any questions or concerns before your next appointment please send [place] a message through [place] or call our office at [phone].    TO LEAVE A MESSAGE FOR THE NURSE SELECT OPTION 2, PLEASE LEAVE A MESSAGE  INCLUDING: YOUR NAME DATE OF BIRTH CALL BACK NUMBER REASON FOR CALL**this is important as we prioritize the call backs  YOU WILL RECEIVE A CALL BACK THE SAME DAY AS LONG AS YOU CALL BEFORE 4:00 PM

## 2022-12-14 ENCOUNTER — Encounter: Payer: Self-pay | Admitting: Internal Medicine

## 2023-01-30 NOTE — Progress Notes (Deleted)
 Office Visit    Patient Name: Kevin Baxter Date of Encounter: 01/30/2023  Primary Care Provider:  Amon Aloysius BRAVO, MD Primary Cardiologist:  None  Chief Complaint    Weight management  Significant Past Medical History   preDM 4/24 A1c 6.0 (on jardiance  for HF)  CHF 2/2 NICM, most recent echo up to 40-45%; on GDMT   VTE 2022 - DVT w/ submassive PE, on Eliquis  prophylactic dose  HTN Controlled on GDMT       Allergies  Allergen Reactions   Pseudoephedrine     REACTION: jittery    History of Present Illness    Kevin Baxter is a 52 y.o. male patient of Dr Cherrie, in the office to discuss options for weight management.     Current weight management medications:   Previously tried meds:   Current meds that may affect weight:   Baseline weight/BMI:   Insurance payor:  [employer] commercial  Diet:   Exercise:   Family History:   Confirmed patient not ***pregnant and no personal or family history of medullary thyroid  carcinoma (MTC) or Multiple Endocrine Neoplasia syndrome type 2 (MEN 2).   Social History:   Tobacco:  Alcohol:  Caffeine:   Adherence Assessment  Do you ever forget to take your medication? [] Yes [] No  Do you ever skip doses due to side effects? [] Yes [] No  Do you have trouble affording your medicines? [] Yes [] No  Are you ever unable to pick up your medication due to transportation difficulties? [] Yes [] No  Do you ever stop taking your medications because you don't believe they are helping? [] Yes [] No  Do you check your weight daily? [] Yes [] No   Adherence strategy: ***  Barriers to obtaining medications: ***   Accessory Clinical Findings    Lab Results  Component Value Date   CREATININE 1.18 12/05/2022   BUN 12 12/05/2022   NA 138 12/05/2022   K 4.7 12/05/2022   CL 103 12/05/2022   CO2 27 12/05/2022   Lab Results  Component Value Date   ALT 19 05/09/2022   AST 17 05/09/2022   ALKPHOS 77 05/09/2022   BILITOT 0.9  05/09/2022   Lab Results  Component Value Date   HGBA1C 6.0 (H) 05/09/2022      Home Medications/Allergies    Current Outpatient Medications  Medication Sig Dispense Refill   allopurinol  (ZYLOPRIM ) 300 MG tablet Take 1 tablet (300 mg total) by mouth daily. 90 tablet 3   apixaban  (ELIQUIS ) 2.5 MG TABS tablet Take 1 tablet (2.5 mg total) by mouth 2 (two) times daily. Please cancel all previous orders for current medication. Change in dosage or pill size. 60 tablet 11   carvedilol  (COREG ) 6.25 MG tablet Take 1.5 tablets (9.375 mg total) by mouth 2 (two) times daily with a meal. 270 tablet 2   CORLANOR  5 MG TABS tablet TAKE 1 TABLET (5 MG TOTAL) BY MOUTH 2 (TWO) TIMES DAILY WITH A MEAL. 180 tablet 1   JARDIANCE  10 MG TABS tablet TAKE 1 TABLET BY MOUTH EVERY DAY 90 tablet 3   sacubitril -valsartan  (ENTRESTO ) 49-51 MG Take 1 tablet by mouth 2 (two) times daily. 60 tablet 11   sertraline  (ZOLOFT ) 100 MG tablet Take 1 tablet (100 mg total) by mouth daily. 90 tablet 3   sildenafil  (VIAGRA ) 50 MG tablet Take 1 tablet (50 mg total) by mouth daily as needed for erectile dysfunction. 10 tablet 6   spironolactone  (ALDACTONE ) 25 MG tablet TAKE  1/2 TABLET BY MOUTH EVERY DAY 45 tablet 4   No current facility-administered medications for this visit.     Allergies  Allergen Reactions   Pseudoephedrine     REACTION: jittery    Assessment & Plan    No problem-specific Assessment & Plan notes found for this encounter.   Dryden Tapley PharmD CPP [facility]  [address] [place], [place] [address] [phone]

## 2023-02-01 ENCOUNTER — Ambulatory Visit: Payer: 59 | Attending: Internal Medicine

## 2023-02-05 ENCOUNTER — Ambulatory Visit: Payer: 59 | Attending: Cardiology | Admitting: Pharmacist Clinician (PhC)/ Clinical Pharmacy Specialist

## 2023-02-05 ENCOUNTER — Encounter: Payer: Self-pay | Admitting: Pharmacist Clinician (PhC)/ Clinical Pharmacy Specialist

## 2023-02-05 ENCOUNTER — Telehealth: Payer: Self-pay | Admitting: Pharmacist Clinician (PhC)/ Clinical Pharmacy Specialist

## 2023-02-05 NOTE — Telephone Encounter (Signed)
 Please do test claim for [facility] for weight management.

## 2023-02-05 NOTE — Patient Instructions (Signed)
 We will start the prior authorization process to get Wegovy (or Zepbound) covered by your insurance.   TIPS FOR SUCCESS Write down the reasons why you want to lose weight and post it in a place where you'll see it often. Start small and work your way up. Keep in mind that it takes time to achieve goals, and small steps add up. Any additional movements help to burn calories. Taking the stairs rather than the elevator and parking at the far end of your parking lot are easy ways to start. Brisk walking for at least 30 minutes 4 or more days of the week is an excellent goal to work toward  [employer] WHAT IT MEANS TO FEEL FULL Did you know that it can take 15 minutes or more for your brain to receive the message that you've eaten? That means that, if you eat less food, but consume it slower, you may still feel satisfied. Eating a lot of fruits and vegetables can also help you feel fuller. Eat off of smaller plates so that moderate portions don't seem too small  TITRATION PLAN Will plan to follow the titration plan as below, pending patient is tolerating each dose before increasing to the next. Can slow titration if needed for tolerability.    -Weeks 1-4: Inject 0.25 mg SQ once weekly  -Weeks 5-8: Inject 0.5 mg SQ once weekly  -Weeks 9-12 Inject 1 mg SQ once weekly  -Weeks 13-16: Inject 1.7 mg SQ once weekly   Follow up in 3 months.  If you have any questions or concerns, please reach out to us .  Tabrina Esty/Chris at [phone].  THANK YOU FOR CHOOSING [facility]

## 2023-02-05 NOTE — Assessment & Plan Note (Signed)
  Patient has not met goal of at least 5% of body weight loss with comprehensive lifestyle modifications alone in the past 3-6 months. Pharmacotherapy is appropriate to pursue as augmentation. Will start Wefovy or Zepbound.   Confirmed patient has no personal or family history of medullary thyroid  carcinoma (MTC) or Multiple Endocrine Neoplasia syndrome type 2 (MEN 2).   Advised patient on common side effects including nausea, diarrhea, dyspepsia, decreased appetite, and fatigue. Counseled patient on reducing meal size and how to titrate medication to minimize side effects. Patient aware to call if intolerable side effects or if experiencing dehydration, abdominal pain, or dizziness. Patient will adhere to dietary modifications and will target at least 150 minutes of moderate intensity exercise weekly.   Injection technique reviewed at today's visit.    Titration Plan:  Will plan to follow the titration plan as below, pending patient is tolerating each dose before increasing to the next. Can slow titration if needed for tolerability.    -Month 1: Inject 0.25 mg SQ once weekly x 4 weeks -Month 2: Inject 0.5 mg SQ once weekly x 4 weeks -Month 3: Inject 1 mg SQ once weekly x 4 weeks -Month 4+: Inject 1.7 mg SQ once weekly   Follow up in 3 months.

## 2023-02-05 NOTE — Progress Notes (Signed)
 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 male patient of Dr Cherrie, in the office to discuss options for weight management.   He tells me that he previously worked with [facility] and was able to lose about 20 pounds, but could not keep their appointment schedule secondary to costs and work.    Current weight management medications: none  Previously tried:  [facility]  Current meds that may affect weight: none  Baseline weight/BMI: 149.1 kg // 43.39  Insurance payor:  [employer] commercial  Diet: mix of home and eating out; trying to eat more at home now that falls sports are completed for his son;  mix of protein, cutting back on pork, eating vegetables regularly, salads; doesn't snack during day, admits downfall is donuts/sugar  Exercise: not regularly, weight training starting today  Family History: father had heart failure 2/2 alcohol, mother no heart disease; 2 brothers, 2 sisters  no heart issues,  DM runs in family  - both sisters; 2 children healthy, very active  Confirmed patient has no personal or family history of medullary thyroid  carcinoma (MTC) or Multiple Endocrine Neoplasia syndrome type 2 (MEN 2).   Social History:   Tobacco: no  Alcohol:no  Caffeine: recently started coffee (with cream/sugar) twice weekly    Accessory Clinical Findings    Lab Results  Component Value Date   CREATININE 1.18 12/05/2022   BUN 12  12/05/2022   NA 138 12/05/2022   K 4.7 12/05/2022   CL 103 12/05/2022   CO2 27 12/05/2022   Lab Results  Component Value Date   ALT 19 05/09/2022   AST 17 05/09/2022   ALKPHOS 77 05/09/2022   BILITOT 0.9 05/09/2022   Lab Results  Component Value Date   HGBA1C 6.0 (H) 05/09/2022    Home Medications/Allergies    Current Outpatient Medications  Medication Sig Dispense Refill   allopurinol  (ZYLOPRIM ) 300 MG tablet Take 1 tablet (300 mg total) by mouth daily. 90 tablet 3   apixaban  (ELIQUIS ) 2.5 MG TABS tablet Take 1 tablet (2.5 mg total) by mouth 2 (two) times daily. Please cancel all previous orders for current medication. Change in dosage or pill size. 60 tablet 11   carvedilol  (COREG ) 6.25 MG tablet Take 1.5 tablets (9.375 mg total) by mouth 2 (two) times daily with a meal. 270 tablet 2   CORLANOR  5 MG TABS tablet TAKE 1 TABLET (5 MG TOTAL) BY MOUTH 2 (TWO) TIMES DAILY WITH A MEAL. 180 tablet 1   JARDIANCE  10 MG TABS tablet TAKE 1 TABLET BY MOUTH EVERY DAY 90 tablet 3   sacubitril -valsartan  (ENTRESTO ) 49-51 MG Take 1 tablet by mouth 2 (two) times daily. 60 tablet 11   sertraline  (ZOLOFT ) 100 MG tablet Take 1 tablet (100 mg total) by mouth daily. 90 tablet 3  sildenafil  (VIAGRA ) 50 MG tablet Take 1 tablet (50 mg total) by mouth daily as needed for erectile dysfunction. 10 tablet 6   spironolactone  (ALDACTONE ) 25 MG tablet TAKE 1/2 TABLET BY MOUTH EVERY DAY 45 tablet 4   No current facility-administered medications for this visit.     Allergies  Allergen Reactions   Pseudoephedrine     REACTION: jittery    Assessment & Plan    Morbid obesity (HCC)  Patient has not met goal of at least 5% of body weight loss with comprehensive lifestyle modifications alone in the past 3-6 months. Pharmacotherapy is appropriate to pursue as augmentation. Will start Wefovy or Zepbound.   Confirmed patient has no personal or family history of medullary thyroid  carcinoma (MTC) or Multiple  Endocrine Neoplasia syndrome type 2 (MEN 2).   Advised patient on common side effects including nausea, diarrhea, dyspepsia, decreased appetite, and fatigue. Counseled patient on reducing meal size and how to titrate medication to minimize side effects. Patient aware to call if intolerable side effects or if experiencing dehydration, abdominal pain, or dizziness. Patient will adhere to dietary modifications and will target at least 150 minutes of moderate intensity exercise weekly.   Injection technique reviewed at today's visit.    Titration Plan:  Will plan to follow the titration plan as below, pending patient is tolerating each dose before increasing to the next. Can slow titration if needed for tolerability.    -Month 1: Inject 0.25 mg SQ once weekly x 4 weeks -Month 2: Inject 0.5 mg SQ once weekly x 4 weeks -Month 3: Inject 1 mg SQ once weekly x 4 weeks -Month 4+: Inject 1.7 mg SQ once weekly   Follow up in 3 months.   Phung Kotas PharmD CPP [facility]  [address] [place], [place] [address] [phone]

## 2023-02-06 ENCOUNTER — Telehealth: Payer: Self-pay | Admitting: Pharmacy Technician

## 2023-02-06 ENCOUNTER — Other Ambulatory Visit (HOSPITAL_COMMUNITY): Payer: Self-pay

## 2023-02-06 NOTE — Telephone Encounter (Signed)
 PA request has been Submitted for [employer]. Reginal Lutes rejected saying non-formulary, zepbound rejected saying not covered. New Encounter created for follow up. For additional info see Pharmacy Prior Auth telephone encounter from 02/06/23.

## 2023-02-06 NOTE — Telephone Encounter (Signed)
 Pharmacy Patient Advocate Encounter   Received notification from Pt Calls Messages that prior authorization for Wegovy 0.25MG /0.5ML auto-injectors is required/requested.   Insurance verification completed.   The patient is insured through  [employer]  .   Per test claim: PA required; PA submitted to above mentioned insurance via CoverMyMeds Key/confirmation #/EOC [id number] Status is pending

## 2023-02-06 NOTE — Telephone Encounter (Signed)
 Pharmacy Patient Advocate Encounter  Received notification from  [employer]  that Prior Authorization for [facility] 0.25MG /0.5ML auto-injectors has been DENIED.  See denial reason below. No denial letter attached in CMM. Will attach denial letter to Media tab once received.   PA #/Case ID/Reference #: [id number]

## 2023-03-31 ENCOUNTER — Ambulatory Visit
Admission: EM | Admit: 2023-03-31 | Discharge: 2023-03-31 | Disposition: A | Discharge status: Home / Self Care | Attending: Internal Medicine | Admitting: Internal Medicine

## 2023-03-31 ENCOUNTER — Other Ambulatory Visit: Payer: Self-pay

## 2023-03-31 DIAGNOSIS — J069 Acute upper respiratory infection, unspecified: Secondary | ICD-10-CM

## 2023-03-31 MED ORDER — PREDNISONE 20 MG PO TABS: 40.0000 mg | ORAL_TABLET | Freq: Every day | ORAL | 0 refills | Status: AC

## 2023-03-31 MED ORDER — AZITHROMYCIN 250 MG PO TABS: ORAL_TABLET | ORAL | 0 refills | Status: DC

## 2023-03-31 MED ORDER — BENZONATATE 100 MG PO CAPS: 100.0000 mg | ORAL_CAPSULE | Freq: Three times a day (TID) | ORAL | 0 refills | Status: DC

## 2023-03-31 NOTE — Discharge Instructions (Addendum)
 Symptoms and physical exam findings consistent with a upper respiratory infection.  Given the severity and duration of the symptoms we will treat with the following: Azithromycin 250mg  Take 2 tablets today and the 1 tablet daily for 4 more days. Prednisone 40 mg (2 tablets) once daily for 5 days. Take this in the morning.  This is a steroid to help with inflammation and pain.  Benzonatate (tessalon) 100 mg every 8 hours as needed for cough.   Rest and stay hydrated.   Return to urgent care or PCP if symptoms worsen or fail to resolve.

## 2023-03-31 NOTE — ED Triage Notes (Signed)
 Has had sickness x 2 weeks, has c/o congestion, cough, head congestion, bil ear fullness, chest congestion. No fever. Has been taking zinc, zycam, drinking honey and lemon.

## 2023-03-31 NOTE — ED Provider Notes (Signed)
 [facility]    CSN: [id number] Arrival date & time: 03/31/23  1103      History   Chief Complaint Chief Complaint  Patient presents with   Cough    HPI Kevin Baxter is a 53 y.o. male.   53 year old male who presents urgent care with complaints of cough, congestion, chest tightness from coughing, head congestion and ear fullness.  He reports his symptoms have been going on for at least 2 weeks now.  He reports his daughter was sick at first and he feels he probably got this from her.  His wife has been sick as well.  He denies any fevers or chills.  He has been using zinc and Zicam over-the-counter.  He has a history of heart failure and hypertension so he is careful with what he takes.  He denies any shortness of breath, sore throat, abdominal pain, nausea, vomiting.   Cough Associated symptoms: rhinorrhea   Associated symptoms: no chest pain, no chills, no ear pain, no fever, no rash, no shortness of breath and no sore throat     Past Medical History:  Diagnosis Date   Allergy    Anxiety    Arthritis    Back pain    CHF (congestive heart failure) (HCC)    Constipation    Depression    ED (erectile dysfunction)    Fundic gland polyposis of stomach    GERD with esophagitis    Gout    History of blood clots    Hx of adenomatous colonic polyps    Hypertension    IBS (irritable bowel syndrome)    Joint pain    Lactose intolerance    OSA on CPAP    Palpitations    Sleep apnea     Patient Active Problem List   Diagnosis Date Noted   Prediabetes 06/14/2022   Vitamin D deficiency 06/14/2022   Other fatigue 05/09/2022   SOB (shortness of breath) on exertion 05/09/2022   BMI 40.0-44.9, adult-current bmi 41.9 05/09/2022   OSA on CPAP 05/09/2022   Mood disorder-Emotional Eating 05/09/2022   Epistaxis 04/07/2022   Peyronie's disease 08/05/2021   Microscopic hematuria 06/03/2020   Protein-calorie malnutrition, severe 04/20/2020   Congestive heart  failure (CHF) (HCC)    Acute saddle pulmonary embolism with acute cor pulmonale (HCC)    Localized swelling of right lower extremity 04/12/2020   Multiple gastric polyps 04/12/2020   Poor responsiveness after MAC anesthesia 04/09/2020 04/12/2020   Hematemesis with nausea 04/12/2020   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   Left ventricular ejection fraction less than 20% 04/12/2020   Acute acalculous cholecystitis 04/12/2020   Mural thrombus of cardiac apex 04/12/2020   Acute deep vein thrombosis (DVT) of right femoral vein (HCC) 04/12/2020   Weight loss, unintentional 04/06/2020   Esophageal dysphagia 04/06/2020   Abnormal LFTs 04/06/2020   Depression, major, single episode, moderate (HCC) 03/02/2020   Pancreatitis, acute 02/26/2020   Hx of adenomatous colonic polyps    Morbid obesity (HCC) 04/22/2015   PCP NOTES >>>>>>>>>>>>>>>>>>>>>>>>> 04/22/2015   Hyperglycemia 03/11/2014   Erectile dysfunction 07/21/2011   Annual physical exam 01/05/2011   Gout 03/14/2006   Essential hypertension 03/14/2006   ALLERGIC RHINITIS 03/14/2006    Past Surgical History:  Procedure Laterality Date   COLONOSCOPY  05/2019   ESOPHAGOGASTRODUODENOSCOPY  05/2019   HAND SURGERY Right    2006 for a FX   IR ANGIOGRAM PULMONARY BILATERAL SELECTIVE  04/16/2020   IR  ANGIOGRAM SELECTIVE EACH ADDITIONAL VESSEL  04/16/2020   IR ANGIOGRAM SELECTIVE EACH ADDITIONAL VESSEL  04/16/2020   IR THROMBECT PRIM MECH INIT (INCLU) MOD SED  04/16/2020   IR US GUIDE VASC ACCESS LEFT  04/16/2020   IR US GUIDE VASC ACCESS LEFT  04/16/2020   IR US GUIDE VASC ACCESS RIGHT  04/16/2020   RADIOLOGY WITH ANESTHESIA N/A 04/16/2020   Procedure: IR WITH ANESTHESIA- PULMONARY EMBOLUS INTERVENTION;  Surgeon: Bennie Dallas, MD;  Location: [facility] OR;  Service: Radiology;  Laterality: N/A;   RIGHT/LEFT HEART CATH AND CORONARY ANGIOGRAPHY N/A 04/15/2020   Procedure: RIGHT/LEFT HEART CATH AND CORONARY ANGIOGRAPHY;  Surgeon: Dolores Patty, MD;  Location: [facility] INVASIVE CV LAB;  Service: Cardiovascular;  Laterality: N/A;       Home Medications    Prior to Admission medications   Medication Sig Start Date End Date Taking? Authorizing Provider  azithromycin (ZITHROMAX) 250 MG tablet Take first 2 tablets together, then 1 every day until finished. 03/31/23  Yes Dinorah Masullo A, PA-C  benzonatate (TESSALON) 100 MG capsule Take 1 capsule (100 mg total) by mouth every 8 (eight) hours. 03/31/23  Yes Avea Mcgowen A, PA-C  predniSONE (DELTASONE) 20 MG tablet Take 2 tablets (40 mg total) by mouth daily with breakfast for 5 days. 03/31/23 04/05/23 Yes Mekaylah Klich A, PA-C  allopurinol (ZYLOPRIM) 300 MG tablet Take 1 tablet (300 mg total) by mouth daily. 09/29/22   [name], MD  apixaban (ELIQUIS) 2.5 MG TABS tablet Take 1 tablet (2.5 mg total) by mouth 2 (two) times daily. Please cancel all previous orders for current medication. Change in dosage or pill size. 12/05/22   Bensimhon, Bevelyn Buckles, MD  carvedilol (COREG) 6.25 MG tablet Take 1.5 tablets (9.375 mg total) by mouth 2 (two) times daily with a meal. 12/05/22   Bensimhon, Bevelyn Buckles, MD  CORLANOR 5 MG TABS tablet TAKE 1 TABLET (5 MG TOTAL) BY MOUTH 2 (TWO) TIMES DAILY WITH A MEAL. 10/03/22   Bensimhon, Bevelyn Buckles, MD  JARDIANCE 10 MG TABS tablet TAKE 1 TABLET BY MOUTH EVERY DAY 03/06/22   Bensimhon, Bevelyn Buckles, MD  sacubitril-valsartan (ENTRESTO) 49-51 MG Take 1 tablet by mouth 2 (two) times daily. 05/17/22   Bensimhon, Bevelyn Buckles, MD  sertraline (ZOLOFT) 100 MG tablet Take 1 tablet (100 mg total) by mouth daily. 05/17/22   Bensimhon, Bevelyn Buckles, MD  sildenafil (VIAGRA) 50 MG tablet Take 1 tablet (50 mg total) by mouth daily as needed for erectile dysfunction. 12/10/20   Bensimhon, Bevelyn Buckles, MD  spironolactone (ALDACTONE) 25 MG tablet TAKE 1/2 TABLET BY MOUTH EVERY DAY 04/25/22   Bensimhon, Bevelyn Buckles, MD    Family History Family History  Problem Relation Age of Onset   Cancer Mother        CERVICAL    Hypertension Father    Heart failure Father        no MI, d/t etoh   Heart disease Father    Alcoholism Father    Diabetes Sister    Diabetes Sister    Diabetes Paternal Aunt    Diabetes Cousin    Colon cancer Neg Hx    Prostate cancer Neg Hx    Stroke Neg Hx    Esophageal cancer Neg Hx    Rectal cancer Neg Hx    Stomach cancer Neg Hx     Social History Social History   Tobacco Use   Smoking status: Never   Smokeless tobacco: Never  Vaping Use   Vaping status: Never Used  Substance Use Topics   Alcohol use: Not Currently    Comment: very rare    Drug use: No     Allergies   Pseudoephedrine   Review of Systems Review of Systems  Constitutional:  Negative for chills and fever.  HENT:  Positive for congestion and rhinorrhea. Negative for ear pain and sore throat.   Eyes:  Negative for pain and visual disturbance.  Respiratory:  Positive for cough and chest tightness. Negative for shortness of breath.   Cardiovascular:  Negative for chest pain and palpitations.  Gastrointestinal:  Negative for abdominal pain and vomiting.  Genitourinary:  Negative for dysuria and hematuria.  Musculoskeletal:  Negative for arthralgias and back pain.  Skin:  Negative for color change and rash.  Neurological:  Negative for seizures and syncope.  All other systems reviewed and are negative.    Physical Exam Triage Vital Signs ED Triage Vitals  Encounter Vitals Group     BP 03/31/23 1106 131/80     Systolic BP Percentile --      Diastolic BP Percentile --      Pulse Rate 03/31/23 1106 81     Resp 03/31/23 1106 18     Temp 03/31/23 1106 98 F (36.7 C)     Temp src --      SpO2 03/31/23 1106 95 %     Weight --      Height --      Head Circumference --      Peak Flow --      Pain Score 03/31/23 1109 0     Pain Loc --      Pain Education --      Exclude from Growth Chart --    No data found.  Updated Vital Signs BP 131/80   Pulse 81   Temp 98 F (36.7 C)   Resp  18   SpO2 95%   Visual Acuity Right Eye Distance:   Left Eye Distance:   Bilateral Distance:    Right Eye Near:   Left Eye Near:    Bilateral Near:     Physical Exam Vitals and nursing note reviewed.  Constitutional:      General: He is not in acute distress.    Appearance: He is well-developed.  HENT:     Head: Normocephalic and atraumatic.     Right Ear: Tympanic membrane normal.     Left Ear: Tympanic membrane normal.     Nose: Congestion present.     Mouth/Throat:     Mouth: Mucous membranes are moist.     Pharynx: Posterior oropharyngeal erythema present.  Eyes:     Conjunctiva/sclera: Conjunctivae normal.  Cardiovascular:     Rate and Rhythm: Normal rate and regular rhythm.     Heart sounds: No murmur heard. Pulmonary:     Effort: Pulmonary effort is normal. No respiratory distress.     Breath sounds: Examination of the right-upper field reveals decreased breath sounds. Examination of the left-upper field reveals decreased breath sounds. Decreased breath sounds present. No wheezing, rhonchi or rales.  Abdominal:     Palpations: Abdomen is soft.     Tenderness: There is no abdominal tenderness.  Musculoskeletal:        General: No swelling.     Cervical back: Neck supple.  Skin:    General: Skin is warm and dry.     Capillary Refill: Capillary refill takes less than 2 seconds.  Neurological:     Mental Status: He is alert.  Psychiatric:        Mood and Affect: Mood normal.      UC Treatments / Results  Labs (all labs ordered are listed, but only abnormal results are displayed) Labs Reviewed - No data to display  EKG   Radiology No results found.  Procedures Procedures (including critical care time)  Medications Ordered in UC Medications - No data to display  Initial Impression / Assessment and Plan / UC Course  I have reviewed the triage vital signs and the nursing notes.  Pertinent labs & imaging results that were available during my care of  the patient were reviewed by me and considered in my medical decision making (see chart for details).     Acute upper respiratory infection   Symptoms and physical exam findings consistent with a upper respiratory infection.  Given the severity and duration of the symptoms we will treat with the following: Azithromycin 250mg  Take 2 tablets today and the 1 tablet daily for 4 more days. Prednisone 40 mg (2 tablets) once daily for 5 days. Take this in the morning.  This is a steroid to help with inflammation and pain.  Benzonatate (tessalon) 100 mg every 8 hours as needed for cough.   Rest and stay hydrated.   Return to urgent care or PCP if symptoms worsen or fail to resolve.    Final Clinical Impressions(s) / UC Diagnoses   Final diagnoses:  Acute upper respiratory infection     Discharge Instructions      Symptoms and physical exam findings consistent with a upper respiratory infection.  Given the severity and duration of the symptoms we will treat with the following: Azithromycin 250mg  Take 2 tablets today and the 1 tablet daily for 4 more days. Prednisone 40 mg (2 tablets) once daily for 5 days. Take this in the morning.  This is a steroid to help with inflammation and pain.  Benzonatate (tessalon) 100 mg every 8 hours as needed for cough.   Rest and stay hydrated.   Return to urgent care or PCP if symptoms worsen or fail to resolve.      ED Prescriptions     Medication Sig Dispense Auth. Provider   azithromycin (ZITHROMAX) 250 MG tablet Take first 2 tablets together, then 1 every day until finished. 6 tablet Deaisha Welborn A, PA-C   predniSONE (DELTASONE) 20 MG tablet Take 2 tablets (40 mg total) by mouth daily with breakfast for 5 days. 10 tablet Doristine Mango A, PA-C   benzonatate (TESSALON) 100 MG capsule Take 1 capsule (100 mg total) by mouth every 8 (eight) hours. 21 capsule Landis Martins, [place]      PDMP not reviewed this encounter.   Landis Martins,  [place] 03/31/23 1136

## 2023-05-17 ENCOUNTER — Encounter: Payer: Self-pay | Admitting: Internal Medicine

## 2023-05-23 ENCOUNTER — Encounter (HOSPITAL_COMMUNITY): Payer: Self-pay

## 2023-05-23 ENCOUNTER — Ambulatory Visit (HOSPITAL_COMMUNITY)
Admission: RE | Admit: 2023-05-23 | Discharge: 2023-05-23 | Disposition: A | Discharge status: Home / Self Care | Source: Ambulatory Visit | Attending: Family Medicine | Admitting: Family Medicine

## 2023-05-23 VITALS — BP 136/72 | HR 77 | Ht 73.0 in | Wt 322.0 lb

## 2023-05-23 DIAGNOSIS — Z86711 Personal history of pulmonary embolism: Secondary | ICD-10-CM | POA: Insufficient documentation

## 2023-05-23 DIAGNOSIS — R062 Wheezing: Secondary | ICD-10-CM | POA: Diagnosis present

## 2023-05-23 DIAGNOSIS — I5022 Chronic systolic (congestive) heart failure: Secondary | ICD-10-CM

## 2023-05-23 DIAGNOSIS — I10 Essential (primary) hypertension: Secondary | ICD-10-CM

## 2023-05-23 DIAGNOSIS — Z6841 Body Mass Index (BMI) 40.0 and over, adult: Secondary | ICD-10-CM | POA: Insufficient documentation

## 2023-05-23 DIAGNOSIS — F418 Other specified anxiety disorders: Secondary | ICD-10-CM | POA: Insufficient documentation

## 2023-05-23 DIAGNOSIS — R570 Cardiogenic shock: Secondary | ICD-10-CM | POA: Insufficient documentation

## 2023-05-23 DIAGNOSIS — Z7901 Long term (current) use of anticoagulants: Secondary | ICD-10-CM | POA: Diagnosis not present

## 2023-05-23 DIAGNOSIS — I428 Other cardiomyopathies: Secondary | ICD-10-CM | POA: Diagnosis not present

## 2023-05-23 DIAGNOSIS — I5082 Biventricular heart failure: Secondary | ICD-10-CM | POA: Diagnosis not present

## 2023-05-23 DIAGNOSIS — Z79899 Other long term (current) drug therapy: Secondary | ICD-10-CM | POA: Insufficient documentation

## 2023-05-23 DIAGNOSIS — I110 Hypertensive heart disease with heart failure: Secondary | ICD-10-CM | POA: Diagnosis not present

## 2023-05-23 DIAGNOSIS — I2609 Other pulmonary embolism with acute cor pulmonale: Secondary | ICD-10-CM | POA: Diagnosis not present

## 2023-05-23 DIAGNOSIS — Z86718 Personal history of other venous thrombosis and embolism: Secondary | ICD-10-CM | POA: Insufficient documentation

## 2023-05-23 DIAGNOSIS — I513 Intracardiac thrombosis, not elsewhere classified: Secondary | ICD-10-CM

## 2023-05-23 LAB — BASIC METABOLIC PANEL WITH GFR
Anion gap: 10 (ref 5–15)
BUN: 9 mg/dL (ref 6–20)
CO2: 27 mmol/L (ref 22–32)
Calcium: 9.5 mg/dL (ref 8.9–10.3)
Chloride: 102 mmol/L (ref 98–111)
Creatinine, Ser: 1.21 mg/dL (ref 0.61–1.24)
GFR, Estimated: >60 mL/min (ref >60)
Glucose, Bld: 104 mg/dL — ABNORMAL HIGH (ref 70–99)
Potassium: 4.4 mmol/L (ref 3.5–5.1)
Sodium: 139 mmol/L (ref 135–145)

## 2023-05-23 MED ORDER — SPIRONOLACTONE 25 MG PO TABS: 25.0000 mg | ORAL_TABLET | Freq: Every day | ORAL | 3 refills | Status: DC

## 2023-05-23 NOTE — Progress Notes (Signed)
 ADVANCED HF CLINIC VISIT  PCP: Ezell Hollow, MD Primary Cardiologists: Gaylyn Keas HF MD: Dr Jules Oar  HPI: Kevin Baxter is a 53 y.o. male with HTN, morbid obesity, pancreatitis, DVT with submassive PE and HFrEF due to NICM.  Admitted in 3/22 for intractable n/v. Initially felt to have acute cholecystitis. Echo showed EF 15% with mod RV dysfunction. + large LV clot. Started on inotropes. Cath showed normal coronaries. cMRI LVEF 12% Apical akinesis Elevated ECV in apex. RVEF 21%.   Post -cath developed worsening CP and tachycardia. CT showed bilateral submassive PE with pulmonary infarct. Underwent catheter-based thrombectomy of PE. Milrinone  slowly weaned off. Discharged 04/24/20.   No recent hospitalizations.   Today he returns for an acute visit for some wheezing he noticed when he was sleeping on his right side. Today he thinks its going away. He was concerned he had fluid. Able to walk up steps with out issues. Denies SOB/PND/Orthopnea. Appetite ok. He has eliminated beef, pork, and sugar. His weight has been trending down.  No fever or chills.  Taking all medications. Continues to work full time.   Cardiac Test  Echo  05/17/22 EF 40-45% RV ok  Echo 06/09/20 EF 25-30% RV mild HK ? Small laminated apical clot  Echo  12/10/20 EF 30-35% Apical AK with small apical thrombus RV ok CMRI 12/22 EF 33% RV 40% Apical thrombus resolved.   ROS: All systems negative except as listed in HPI, PMH and Problem List.  SH:  Social History   Socioeconomic History   Marital status: Married    Spouse name: Not on file   Number of children: 2   Years of education: Not on file   Highest education level: Some college, no degree  Occupational History   Occupation: works for [employer]  Tobacco Use   Smoking status: Never   Smokeless tobacco: Never  Vaping Use   Vaping status: Never Used  Substance and Sexual Activity   Alcohol use: Not Currently     Comment: very rare    Drug use: No   Sexual activity: Not on file  Other Topics Concern   Not on file  Social History Narrative   Household: pt, wife daughter (2013),  boy  (2006),    Social Drivers of Corporate investment banker Strain: Not on file  Food Insecurity: Not on file  Transportation Needs: Not on file  Physical Activity: Not on file  Stress: Not on file  Social Connections: Unknown (06/12/2021)   Received from [facility], [facility]   Social Network    Social Network: Not on file  Intimate Partner Violence: Unknown (05/04/2021)   Received from [facility], [facility]   HITS    Physically Hurt: Not on file    Insult or Talk Down To: Not on file    Threaten Physical Harm: Not on file    Scream or Curse: Not on file    FH:  Family History  Problem Relation Age of Onset   Cancer Mother        CERVICAL   Hypertension Father    Heart failure Father        no MI, d/t etoh   Heart disease Father    Alcoholism Father    Diabetes Sister  Diabetes Sister    Diabetes Paternal Aunt    Diabetes Cousin    Colon cancer Neg Hx    Prostate cancer Neg Hx    Stroke Neg Hx    Esophageal cancer Neg Hx    Rectal cancer Neg Hx    Stomach cancer Neg Hx     Past Medical History:  Diagnosis Date   Allergy    Anxiety    Arthritis    Back pain    CHF (congestive heart failure) (HCC)    Constipation    Depression    ED (erectile dysfunction)    Fundic gland polyposis of stomach    GERD with esophagitis    Gout    History of blood clots    Hx of adenomatous colonic polyps    Hypertension    IBS (irritable bowel syndrome)    Joint pain    Lactose intolerance    OSA on CPAP    Palpitations    Sleep apnea     Current Outpatient Medications  Medication Sig Dispense Refill   allopurinol  (ZYLOPRIM ) 300 MG tablet Take 1 tablet (300 mg total) by mouth daily. 90 tablet 3   apixaban  (ELIQUIS ) 2.5 MG TABS tablet Take 1 tablet (2.5 mg total) by mouth 2 (two)  times daily. Please cancel all previous orders for current medication. Change in dosage or pill size. 60 tablet 11   carvedilol  (COREG ) 6.25 MG tablet Take 1.5 tablets (9.375 mg total) by mouth 2 (two) times daily with a meal. 270 tablet 2   CORLANOR  5 MG TABS tablet TAKE 1 TABLET (5 MG TOTAL) BY MOUTH 2 (TWO) TIMES DAILY WITH A MEAL. 180 tablet 1   JARDIANCE  10 MG TABS tablet TAKE 1 TABLET BY MOUTH EVERY DAY 90 tablet 3   sacubitril -valsartan  (ENTRESTO ) 49-51 MG Take 1 tablet by mouth 2 (two) times daily. 60 tablet 11   sertraline  (ZOLOFT ) 100 MG tablet Take 1 tablet (100 mg total) by mouth daily. 90 tablet 3   sildenafil  (VIAGRA ) 50 MG tablet Take 1 tablet (50 mg total) by mouth daily as needed for erectile dysfunction. 10 tablet 6   spironolactone  (ALDACTONE ) 25 MG tablet Take 1 tablet (25 mg total) by mouth daily. 90 tablet 3   No current facility-administered medications for this encounter.    Vitals:   05/23/23 1203  BP: 136/72  Pulse: 77  SpO2: 96%  Weight: (!) 146.1 kg (322 lb)  Height: 6\' 1"  (1.854 m)     Wt Readings from Last 3 Encounters:  05/23/23 (!) 146.1 kg (322 lb)  02/05/23 (!) 149.1 kg (328 lb 12.8 oz)  12/05/22 (!) 146.6 kg (323 lb 3.2 oz)    PHYSICAL EXAM: General:   No resp difficulty. Walked in the clinic.  Neck: supple. no JVD.  Cor: PMI nondisplaced. Regular rate & rhythm. No rubs, gallops or murmurs. Lungs: clear, no wheeze on exam.  Abdomen: soft, nontender, nondistended.  Extremities: no cyanosis, clubbing, rash, edema Neuro: alert & oriented x3   ASSESSMENT & PLAN:   1. Chronic biventricular CHF with cardiogenic shock: - ECHO 3/22  LVEF < 20% with large LV thrombus,mild to moderate RV dysfunction - Cardiac catheterization 04/16/20 with normal coronaries - cMRI 3/22 LVEF 12% RVEF 21%. Apical akinesis Elevated ECV in apex. No LGE in apex. RVEF 21%. - also complicated by RV failure from PE - Echo  06/09/20 EF 25-30% RV mild HK ? Small laminated  apical clot  - Echo 12/10/20 EF  30-35% Apical AK with small apical thrombus RV ok - CMRI 12/2020 EF 33% RV 40%. No LV thrombus - Echo 05/17/22 EF 40-45% RV ok. ReDs reading: 17 %, Low. Not suggsesting of fluid NYHA I. Appears euvolemic. On exam he has no wheeze. Does not need loop diuretics.  -Increase spiro to 25 mg daily - Continue entresto  to 49/51 BID - Continue ivabradine  to 5 bid - Continue jardiance  10  - Continue carvedilol  9.375 bid - Set up repeat ECHO. Check BMET today and in 7 days.   2. LV thrombus: - CMRI  2022 clot resolved.  - Continue lifelong Eliquis  for h/o DVT/PE   3. 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 Also R femoral DVT - Chest CT 3/22 bilateral submassive PE with RV strain and RUL infarct - s/p Penumbra clot extraction 04/16/20 - Echo 11/2020 RV normal  - Will need lifelong Eliquis . Switch to eliquis  2.5 bid based on Amplify-EXT  - No bleeding  4. HTN  -Elevated on arrival. As above increase spiro and continuing other medicaitons.   5. Depression/anxiety - Continue zoloft    6. Morbid obesity - Body mass index is 42.48 kg/m. - He was referred for GLP1 but denied. He has altered his diet.   If wheeze returns will need to follow up with PCP.  Follow up with Dr Julane Ny next month.   Nieves Bars, NP  12:35 PM

## 2023-05-23 NOTE — Progress Notes (Signed)
 ReDS Vest / Clip - 05/23/23 1203       ReDS Vest / Clip   Station Marker D    Ruler Value 43    ReDS Value Range Low volume    ReDS Actual Value 17

## 2023-05-23 NOTE — Patient Instructions (Signed)
 Medication Changes:  INCREASE SPIRONOLACTONE  TO 25MG  ONCE DAILY   Lab Work:  Labs done today, your results will be available in MyChart, we will contact you for abnormal readings.  Testing/Procedures:  ECHOCARDIOGRAM AS SCHEDULED   Follow-Up in: AS SCHEDULED WITH DR. Julane Ny   At the [facility], you and your health needs are our priority. We have a designated team specialized in the treatment of Heart Failure. This Care Team includes your primary Heart Failure Specialized Cardiologist (physician), Advanced Practice Providers (APPs- Physician Assistants and Nurse Practitioners), and Pharmacist who all work together to provide you with the care you need, when you need it.   You may see any of the following providers on your designated Care Team at your next follow up:  Dr. Jules Oar Dr. Peder Bourdon Dr. Alwin Baars Dr. Judyth Nunnery Nieves Bars, NP Ruddy Corral, [place] [facility] [place], [place] Dennise Fitz, NP [place] Lee, NP Luster Salters, PharmD   Please be sure to bring in all your medications bottles to every appointment.   Need to Contact Us :  If you have any questions or concerns before your next appointment please send us  a message through [place] or call our office at [phone].    TO LEAVE A MESSAGE FOR THE NURSE SELECT OPTION 2, PLEASE LEAVE A MESSAGE INCLUDING: YOUR NAME DATE OF BIRTH CALL BACK NUMBER REASON FOR CALL**this is important as we prioritize the call backs  YOU WILL RECEIVE A CALL BACK THE SAME DAY AS LONG AS YOU CALL BEFORE 4:00 PM

## 2023-05-30 ENCOUNTER — Other Ambulatory Visit (HOSPITAL_COMMUNITY)

## 2023-06-05 ENCOUNTER — Ambulatory Visit (HOSPITAL_COMMUNITY)
Admission: RE | Admit: 2023-06-05 | Discharge: 2023-06-05 | Disposition: A | Discharge status: Home / Self Care | Source: Ambulatory Visit | Attending: Internal Medicine | Admitting: Internal Medicine

## 2023-06-05 ENCOUNTER — Encounter (HOSPITAL_COMMUNITY): Payer: Self-pay | Admitting: Internal Medicine

## 2023-06-05 VITALS — BP 120/80 | HR 79 | Wt 325.2 lb

## 2023-06-05 DIAGNOSIS — G4733 Obstructive sleep apnea (adult) (pediatric): Secondary | ICD-10-CM | POA: Diagnosis not present

## 2023-06-05 DIAGNOSIS — Z7901 Long term (current) use of anticoagulants: Secondary | ICD-10-CM | POA: Diagnosis not present

## 2023-06-05 DIAGNOSIS — F32A Depression, unspecified: Secondary | ICD-10-CM | POA: Diagnosis not present

## 2023-06-05 DIAGNOSIS — Z79899 Other long term (current) drug therapy: Secondary | ICD-10-CM | POA: Insufficient documentation

## 2023-06-05 DIAGNOSIS — I428 Other cardiomyopathies: Secondary | ICD-10-CM | POA: Insufficient documentation

## 2023-06-05 DIAGNOSIS — I110 Hypertensive heart disease with heart failure: Secondary | ICD-10-CM | POA: Diagnosis not present

## 2023-06-05 DIAGNOSIS — Z6841 Body Mass Index (BMI) 40.0 and over, adult: Secondary | ICD-10-CM | POA: Diagnosis not present

## 2023-06-05 DIAGNOSIS — I5082 Biventricular heart failure: Secondary | ICD-10-CM | POA: Insufficient documentation

## 2023-06-05 DIAGNOSIS — F419 Anxiety disorder, unspecified: Secondary | ICD-10-CM | POA: Diagnosis not present

## 2023-06-05 DIAGNOSIS — Z86718 Personal history of other venous thrombosis and embolism: Secondary | ICD-10-CM | POA: Diagnosis not present

## 2023-06-05 DIAGNOSIS — Z86711 Personal history of pulmonary embolism: Secondary | ICD-10-CM | POA: Insufficient documentation

## 2023-06-05 DIAGNOSIS — I2609 Other pulmonary embolism with acute cor pulmonale: Secondary | ICD-10-CM

## 2023-06-05 DIAGNOSIS — I5022 Chronic systolic (congestive) heart failure: Secondary | ICD-10-CM | POA: Diagnosis present

## 2023-06-05 NOTE — Patient Instructions (Signed)
 Good to see you today!  No medication changes  Your physician has requested that you have an echocardiogram. Echocardiography is a painless test that uses sound waves to create images of your heart. It provides your doctor with information about the size and shape of your heart and how well your heart's chambers and valves are working. This procedure takes approximately one hour. There are no restrictions for this procedure. Please do NOT wear cologne, perfume, aftershave, or lotions (deodorant is allowed). Please arrive 15 minutes prior to your appointment time.  Please note: We ask at that you not bring children with you during ultrasound (echo/ vascular) testing. Due to room size and safety concerns, children are not allowed in the ultrasound rooms during exams. Our front office staff cannot provide observation of children in our lobby area while testing is being conducted. An adult accompanying a patient to their appointment will only be allowed in the ultrasound room at the discretion of the ultrasound technician under special circumstances. We apologize for any inconvenience.  Your physician recommends that you schedule a follow-up appointment in: 9 months(February 2026) Call office in December 2025 to schedule an appointment  If you have any questions or concerns before your next appointment please send us  a message through [place] or call our office at [phone].    TO LEAVE A MESSAGE FOR THE NURSE SELECT OPTION 2, PLEASE LEAVE A MESSAGE INCLUDING: YOUR NAME DATE OF BIRTH CALL BACK NUMBER REASON FOR CALL**this is important as we prioritize the call backs  YOU WILL RECEIVE A CALL BACK THE SAME DAY AS LONG AS YOU CALL BEFORE 4:00 PM At the [facility], you and your health needs are our priority. As part of our continuing mission to provide you with exceptional heart care, we have created designated Provider Care Teams. These Care Teams include your primary Cardiologist  (physician) and Advanced Practice Providers (APPs- Physician Assistants and Nurse Practitioners) who all work together to provide you with the care you need, when you need it.   You may see any of the following providers on your designated Care Team at your next follow up: Dr Jules Oar Dr Peder Bourdon Dr. Alwin Baars Dr. Arta Lark Amy Marijane Shoulders, NP Ruddy Corral, [place] [facility] [place], [place] Dennise Fitz, NP [place] Lee, NP [place] Dellen, NP Luster Salters, PharmD Bevely Brush, PharmD   Please be sure to bring in all your medications bottles to every appointment.    Thank you for choosing [facility]

## 2023-06-05 NOTE — Progress Notes (Signed)
 ADVANCED HF CLINIC VISIT  PCP: Ezell Hollow, MD Primary Cardiologists: Gaylyn Keas HF MD: Dr Jules Oar  HPI: Kevin Baxter is a 53 y.o. male with HTN, morbid obesity, pancreatitis, DVT with submassive PE and HFrEF due to NICM.  Admitted in 3/22 for intractable n/v. Initially felt to have acute cholecystitis. Echo showed EF 15% with mod RV dysfunction. + large LV clot. Started on inotropes. Cath showed normal coronaries. cMRI LVEF 12% Apical akinesis Elevated ECV in apex. RVEF 21%.   Post -cath developed worsening CP and tachycardia. CT showed bilateral submassive PE with pulmonary infarct. Underwent catheter-based thrombectomy of PE. Milrinone  slowly weaned off. Discharged 04/24/20.   Here for routine f/u. Feels good. No CP or SOB. No edema, orthopnea or PND. Works [employer] virtually. Struggling to lose weight. Compliant with meds  Cardiac Test  Echo  05/17/22 EF 40-45% RV ok  Echo 06/09/20 EF 25-30% RV mild HK ? Small laminated apical clot  Echo  12/10/20 EF 30-35% Apical AK with small apical thrombus RV ok CMRI 12/22 EF 33% RV 40% Apical thrombus resolved.   ROS: All systems negative except as listed in HPI, PMH and Problem List.  SH:  Social History   Socioeconomic History   Marital status: Married    Spouse name: Not on file   Number of children: 2   Years of education: Not on file   Highest education level: Some college, no degree  Occupational History   Occupation: works for [employer]  Tobacco Use   Smoking status: Never   Smokeless tobacco: Never  Vaping Use   Vaping status: Never Used  Substance and Sexual Activity   Alcohol use: Not Currently    Comment: very rare    Drug use: No   Sexual activity: Not on file  Other Topics Concern   Not on file  Social History Narrative   Household: pt, wife daughter (2013),  boy  (2006),    Social Drivers of Corporate investment banker Strain: Not on file  Food Insecurity: Not on file   Transportation Needs: Not on file  Physical Activity: Not on file  Stress: Not on file  Social Connections: Unknown (06/12/2021)   Received from [facility], [facility]   Social Network    Social Network: Not on file  Intimate Partner Violence: Unknown (05/04/2021)   Received from [facility], [facility]   HITS    Physically Hurt: Not on file    Insult or Talk Down To: Not on file    Threaten Physical Harm: Not on file    Scream or Curse: Not on file    FH:  Family History  Problem Relation Age of Onset   Cancer Mother        CERVICAL   Hypertension Father    Heart failure Father        no MI, d/t etoh   Heart disease Father    Alcoholism Father    Diabetes Sister    Diabetes Sister    Diabetes Paternal Aunt    Diabetes Cousin    Colon cancer Neg Hx    Prostate cancer Neg Hx    Stroke Neg Hx    Esophageal cancer Neg Hx    Rectal cancer Neg Hx    Stomach cancer  Neg Hx     Past Medical History:  Diagnosis Date   Allergy    Anxiety    Arthritis    Back pain    CHF (congestive heart failure) (HCC)    Constipation    Depression    ED (erectile dysfunction)    Fundic gland polyposis of stomach    GERD with esophagitis    Gout    History of blood clots    Hx of adenomatous colonic polyps    Hypertension    IBS (irritable bowel syndrome)    Joint pain    Lactose intolerance    OSA on CPAP    Palpitations    Sleep apnea     Current Outpatient Medications  Medication Sig Dispense Refill   allopurinol  (ZYLOPRIM ) 300 MG tablet Take 1 tablet (300 mg total) by mouth daily. 90 tablet 3   apixaban  (ELIQUIS ) 2.5 MG TABS tablet Take 1 tablet (2.5 mg total) by mouth 2 (two) times daily. Please cancel all previous orders for current medication. Change in dosage or pill size. 60 tablet 11   carvedilol  (COREG ) 6.25 MG tablet Take 1.5 tablets (9.375 mg total) by mouth 2 (two) times daily with a meal. 270 tablet 2   CORLANOR  5 MG TABS tablet TAKE 1 TABLET (5 MG  TOTAL) BY MOUTH 2 (TWO) TIMES DAILY WITH A MEAL. 180 tablet 1   JARDIANCE  10 MG TABS tablet TAKE 1 TABLET BY MOUTH EVERY DAY 90 tablet 3   sacubitril -valsartan  (ENTRESTO ) 49-51 MG Take 1 tablet by mouth 2 (two) times daily. 60 tablet 11   sertraline  (ZOLOFT ) 100 MG tablet Take 1 tablet (100 mg total) by mouth daily. 90 tablet 3   sildenafil  (VIAGRA ) 50 MG tablet Take 1 tablet (50 mg total) by mouth daily as needed for erectile dysfunction. 10 tablet 6   spironolactone  (ALDACTONE ) 25 MG tablet Take 1 tablet (25 mg total) by mouth daily. 90 tablet 3   No current facility-administered medications for this encounter.    Vitals:   06/05/23 1554  BP: 120/80  Pulse: 79  SpO2: 96%  Weight: (!) 147.5 kg (325 lb 3.2 oz)     Wt Readings from Last 3 Encounters:  06/05/23 (!) 147.5 kg (325 lb 3.2 oz)  05/23/23 (!) 146.1 kg (322 lb)  02/05/23 (!) 149.1 kg (328 lb 12.8 oz)     PHYSICAL EXAM: General:  Well appearing. No resp difficulty HEENT: normal Neck: supple. no JVD. Carotids 2+ bilat; no bruits. No lymphadenopathy or thryomegaly appreciated. Cor: PMI nondisplaced. Regular rate & rhythm. No rubs, gallops or murmurs. Lungs: clear Abdomen: obese soft, nontender, nondistended. No hepatosplenomegaly. No bruits or masses. Good bowel sounds. Extremities: no cyanosis, clubbing, rash, edema Neuro: alert & orientedx3, cranial nerves grossly intact. moves all 4 extremities w/o difficulty. Affect pleasant   ECG:  NSR 78 1AVB 208 ms PRWP Personally reviewed   ASSESSMENT & PLAN:   1. Chronic biventricular CHF with cardiogenic shock: - ECHO 3/22  LVEF < 20% with large LV thrombus,mild to moderate RV dysfunction - Cardiac catheterization 04/16/20 with normal coronaries - cMRI 3/22 LVEF 12% RVEF 21%. Apical akinesis Elevated ECV in apex. No LGE in apex. RVEF 21%. - also complicated by RV failure from PE - Echo  06/09/20 EF 25-30% RV mild HK ? Small laminated apical clot  - Echo 12/10/20 EF 30-35%  Apical AK with small apical thrombus RV ok - CMRI 12/2020 EF 33% RV 40%. No LV thrombus - Echo  05/17/22 EF 40-45% RV ok. - Doing very well. NYHA I. Volume ok. On good GDMT - Continue spiro 25 mg daily - Continue entresto  to 49/51 BID - Continue ivabradine  to 5 bid - Continue jardiance  10  - Continue carvedilol  9.375 bid - Needs repeat echo  - Recent bloodwork from 05/23/23 reviewed K 4.4 SCr 1.2  2. LV thrombus: - CMRI  2022 clot resolved.  - Continue lifelong Eliquis  for h/o DVT/PE - No bleeding   3. 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 Also R femoral DVT - Chest CT 3/22 bilateral submassive PE with RV strain and RUL infarct - s/p Penumbra clot extraction 04/16/20 - Echo 11/2020 RV normal  - On eliquis  2.5 bid based on Amplify-EXT  - No bleeding  4. HTN  - Blood pressure well controlled. Continue current regimen.  5. Depression/anxiety - Continue zoloft   - Much improved  6. Morbid obesity - Body mass index is 42.9 kg/m. - PCP working on getting [id number] -> agree with plan   Jules Oar, MD  4:21 PM

## 2023-06-06 ENCOUNTER — Other Ambulatory Visit (INDEPENDENT_AMBULATORY_CARE_PROVIDER_SITE_OTHER): Payer: Self-pay | Admitting: Family Medicine

## 2023-06-06 ENCOUNTER — Other Ambulatory Visit (HOSPITAL_COMMUNITY): Payer: Self-pay | Admitting: Internal Medicine

## 2023-06-06 DIAGNOSIS — E559 Vitamin D deficiency, unspecified: Secondary | ICD-10-CM

## 2023-06-08 ENCOUNTER — Other Ambulatory Visit (HOSPITAL_COMMUNITY): Payer: Self-pay | Admitting: Internal Medicine

## 2023-06-21 ENCOUNTER — Other Ambulatory Visit (HOSPITAL_COMMUNITY): Payer: Self-pay

## 2023-06-22 ENCOUNTER — Other Ambulatory Visit (HOSPITAL_COMMUNITY): Payer: Self-pay | Admitting: Internal Medicine

## 2023-06-22 ENCOUNTER — Other Ambulatory Visit (HOSPITAL_COMMUNITY): Payer: Self-pay | Admitting: Cardiology

## 2023-06-22 MED ORDER — ENTRESTO 49-51 MG PO TABS: 1.0000 | ORAL_TABLET | Freq: Two times a day (BID) | ORAL | 6 refills | Status: AC

## 2023-06-26 ENCOUNTER — Other Ambulatory Visit (HOSPITAL_COMMUNITY): Payer: Self-pay

## 2023-06-26 ENCOUNTER — Telehealth (HOSPITAL_COMMUNITY): Payer: Self-pay

## 2023-06-26 NOTE — Telephone Encounter (Signed)
 Advanced Heart Failure Patient Advocate Encounter  Prior authorization for Entresto  has been submitted and approved. Test billing returns $80 for 30 day supply, $240 for 90 day supply. This patient is eligible to use copay savings card for this medication.  Key: [id number] Effective: 06/26/2023 to 06/25/2024  Kennis Peacock, CPhT Rx Patient Advocate Phone: [phone]

## 2023-07-03 ENCOUNTER — Ambulatory Visit (HOSPITAL_COMMUNITY)
Admission: RE | Admit: 2023-07-03 | Discharge: 2023-07-03 | Disposition: A | Discharge status: Home / Self Care | Source: Ambulatory Visit | Attending: Internal Medicine | Admitting: Internal Medicine

## 2023-07-03 DIAGNOSIS — I5022 Chronic systolic (congestive) heart failure: Secondary | ICD-10-CM | POA: Diagnosis present

## 2023-07-03 LAB — ECHOCARDIOGRAM COMPLETE
Area-P 1/2: 2.8 cm2
Calc EF: 54.4 %
S' Lateral: 3.2 cm
Single Plane A2C EF: 59.1 %
Single Plane A4C EF: 48.6 %

## 2023-07-04 ENCOUNTER — Ambulatory Visit (HOSPITAL_COMMUNITY): Payer: Self-pay | Admitting: Adult Health

## 2023-07-04 NOTE — Telephone Encounter (Addendum)
 Pt aware, agreeable, and verbalized understanding   ----- Message from Nieves Bars sent at 07/04/2023 11:47 AM EDT ----- Please call with result note.

## 2023-07-04 NOTE — Addendum Note (Signed)
 Addended by: Bo Rogue M on: 07/04/2023 03:28 PM   Modules accepted: Orders

## 2023-08-16 ENCOUNTER — Other Ambulatory Visit: Payer: Self-pay | Admitting: Internal Medicine

## 2023-09-17 ENCOUNTER — Other Ambulatory Visit: Payer: Self-pay | Admitting: Internal Medicine

## 2023-09-22 ENCOUNTER — Other Ambulatory Visit (HOSPITAL_COMMUNITY): Payer: Self-pay | Admitting: Internal Medicine

## 2023-09-25 ENCOUNTER — Other Ambulatory Visit (HOSPITAL_COMMUNITY): Payer: Self-pay | Admitting: Cardiology

## 2023-09-25 MED ORDER — IVABRADINE HCL 5 MG PO TABS: 5.0000 mg | ORAL_TABLET | Freq: Two times a day (BID) | ORAL | 1 refills | Status: AC

## 2023-09-28 ENCOUNTER — Telehealth (HOSPITAL_COMMUNITY): Payer: Self-pay | Admitting: Pharmacy Technician

## 2023-09-28 ENCOUNTER — Other Ambulatory Visit (HOSPITAL_COMMUNITY): Payer: Self-pay

## 2023-09-28 NOTE — Telephone Encounter (Signed)
 Pharmacy Patient Advocate Encounter  Insurance verification completed.   The patient is insured through [facility]   Ran test claim for Ivabradine . [employer] pays for generic.) Currently a co-pay cannot be seen. It was refilled on 08/26 and is a refill too soon until 11/02.  This test claim was processed through [facility]- copay amounts may vary at other pharmacies due to pharmacy/plan contracts, or as the patient moves through the different stages of their insurance plan.   Almarie JULIANNA Pa, CPhT

## 2023-12-20 ENCOUNTER — Other Ambulatory Visit (HOSPITAL_COMMUNITY): Payer: Self-pay | Admitting: Internal Medicine

## 2023-12-25 ENCOUNTER — Other Ambulatory Visit: Payer: Self-pay | Admitting: Internal Medicine

## 2024-01-17 ENCOUNTER — Other Ambulatory Visit: Payer: Self-pay | Admitting: Internal Medicine

## 2024-02-02 ENCOUNTER — Other Ambulatory Visit (HOSPITAL_COMMUNITY): Payer: Self-pay | Admitting: Internal Medicine

## 2024-02-22 ENCOUNTER — Other Ambulatory Visit (HOSPITAL_COMMUNITY): Payer: Self-pay | Admitting: Internal Medicine
